# Patient Record
Sex: Female | Born: 1950
Health system: Southern US, Community
[De-identification: ages and names within clinical notes are randomized; demographics above are authoritative.]

## PROBLEM LIST (undated history)

## (undated) DIAGNOSIS — R011 Cardiac murmur, unspecified: Secondary | ICD-10-CM

## (undated) DIAGNOSIS — D649 Anemia, unspecified: Secondary | ICD-10-CM

## (undated) DIAGNOSIS — J349 Unspecified disorder of nose and nasal sinuses: Secondary | ICD-10-CM

## (undated) DIAGNOSIS — K5900 Constipation, unspecified: Secondary | ICD-10-CM

## (undated) DIAGNOSIS — I809 Phlebitis and thrombophlebitis of unspecified site: Secondary | ICD-10-CM

## (undated) DIAGNOSIS — I82409 Acute embolism and thrombosis of unspecified deep veins of unspecified lower extremity: Secondary | ICD-10-CM

## (undated) DIAGNOSIS — R519 Headache, unspecified: Secondary | ICD-10-CM

## (undated) DIAGNOSIS — I639 Cerebral infarction, unspecified: Secondary | ICD-10-CM

## (undated) DIAGNOSIS — R51 Headache: Secondary | ICD-10-CM

## (undated) DIAGNOSIS — I10 Essential (primary) hypertension: Secondary | ICD-10-CM

## (undated) DIAGNOSIS — R002 Palpitations: Secondary | ICD-10-CM

## (undated) DIAGNOSIS — M791 Myalgia, unspecified site: Secondary | ICD-10-CM

## (undated) HISTORY — DX: Headache: R51

## (undated) HISTORY — DX: Myalgia, unspecified site: M79.10

## (undated) HISTORY — DX: Cerebral infarction, unspecified: I63.9

## (undated) HISTORY — DX: Unspecified disorder of nose and nasal sinuses: J34.9

## (undated) HISTORY — DX: Palpitations: R00.2

## (undated) HISTORY — DX: Headache, unspecified: R51.9

## (undated) HISTORY — PX: PARTIAL HYSTERECTOMY: SHX80

## (undated) HISTORY — DX: Phlebitis and thrombophlebitis of unspecified site: I80.9

## (undated) HISTORY — PX: COLONOSCOPY: SHX174

---

## 2002-09-17 ENCOUNTER — Encounter: Payer: Self-pay | Admitting: Neurology

## 2002-09-17 ENCOUNTER — Inpatient Hospital Stay (HOSPITAL_COMMUNITY): Admission: EM | Admit: 2002-09-17 | Discharge: 2002-09-24 | Payer: Self-pay | Admitting: Emergency Medicine

## 2002-09-17 ENCOUNTER — Encounter: Payer: Self-pay | Admitting: Emergency Medicine

## 2002-09-18 ENCOUNTER — Encounter: Payer: Self-pay | Admitting: Neurology

## 2002-09-24 ENCOUNTER — Encounter: Payer: Self-pay | Admitting: Physical Medicine & Rehabilitation

## 2002-09-24 ENCOUNTER — Inpatient Hospital Stay (HOSPITAL_COMMUNITY)
Admission: RE | Admit: 2002-09-24 | Discharge: 2002-10-25 | Payer: Self-pay | Admitting: Physical Medicine & Rehabilitation

## 2002-09-25 ENCOUNTER — Encounter: Payer: Self-pay | Admitting: Physical Medicine & Rehabilitation

## 2003-03-04 ENCOUNTER — Encounter
Admission: RE | Admit: 2003-03-04 | Discharge: 2003-06-02 | Payer: Self-pay | Admitting: Physical Medicine & Rehabilitation

## 2003-07-01 ENCOUNTER — Encounter
Admission: RE | Admit: 2003-07-01 | Discharge: 2003-09-29 | Payer: Self-pay | Admitting: Physical Medicine & Rehabilitation

## 2003-10-02 ENCOUNTER — Encounter
Admission: RE | Admit: 2003-10-02 | Discharge: 2003-12-31 | Payer: Self-pay | Admitting: Physical Medicine & Rehabilitation

## 2004-01-01 ENCOUNTER — Encounter
Admission: RE | Admit: 2004-01-01 | Discharge: 2004-03-31 | Payer: Self-pay | Admitting: Physical Medicine & Rehabilitation

## 2004-04-01 ENCOUNTER — Encounter
Admission: RE | Admit: 2004-04-01 | Discharge: 2004-06-30 | Payer: Self-pay | Admitting: Physical Medicine & Rehabilitation

## 2004-07-06 ENCOUNTER — Encounter
Admission: RE | Admit: 2004-07-06 | Discharge: 2004-09-30 | Payer: Self-pay | Admitting: Physical Medicine & Rehabilitation

## 2004-07-07 ENCOUNTER — Ambulatory Visit: Payer: Self-pay | Admitting: Physical Medicine & Rehabilitation

## 2004-09-30 ENCOUNTER — Encounter
Admission: RE | Admit: 2004-09-30 | Discharge: 2004-12-29 | Payer: Self-pay | Admitting: Physical Medicine & Rehabilitation

## 2004-10-29 ENCOUNTER — Ambulatory Visit: Payer: Self-pay | Admitting: Physical Medicine & Rehabilitation

## 2005-01-25 ENCOUNTER — Encounter
Admission: RE | Admit: 2005-01-25 | Discharge: 2005-04-25 | Payer: Self-pay | Admitting: Physical Medicine & Rehabilitation

## 2005-01-27 ENCOUNTER — Ambulatory Visit: Payer: Self-pay | Admitting: Physical Medicine & Rehabilitation

## 2005-04-26 ENCOUNTER — Encounter
Admission: RE | Admit: 2005-04-26 | Discharge: 2005-07-25 | Payer: Self-pay | Admitting: Physical Medicine & Rehabilitation

## 2005-06-24 ENCOUNTER — Ambulatory Visit: Payer: Self-pay | Admitting: Physical Medicine & Rehabilitation

## 2005-10-15 ENCOUNTER — Encounter
Admission: RE | Admit: 2005-10-15 | Discharge: 2006-01-13 | Payer: Self-pay | Admitting: Physical Medicine & Rehabilitation

## 2005-11-18 ENCOUNTER — Ambulatory Visit: Payer: Self-pay | Admitting: Physical Medicine & Rehabilitation

## 2013-10-03 ENCOUNTER — Ambulatory Visit: Payer: Self-pay

## 2013-10-08 ENCOUNTER — Ambulatory Visit (INDEPENDENT_AMBULATORY_CARE_PROVIDER_SITE_OTHER): Payer: Medicare HMO | Admitting: Podiatrist

## 2013-10-08 ENCOUNTER — Encounter: Payer: Self-pay | Admitting: Podiatrist

## 2013-10-08 VITALS — BP 143/87 | HR 70 | Resp 16

## 2013-10-08 DIAGNOSIS — M79609 Pain in unspecified limb: Secondary | ICD-10-CM

## 2013-10-08 DIAGNOSIS — B351 Tinea unguium: Secondary | ICD-10-CM

## 2013-10-08 NOTE — Progress Notes (Signed)
   Subjective:    Patient ID: Anna Hayes, female    DOB: May 23, 1951, 63 y.o.   MRN: [id number]  HPI My nails are sore and uses a crest pad and has been going on for about 6 months and no burning and no throbbing and some thickness and it is on the great toes on both and the 2nd toe left foot    Review of Systems  Constitutional: Negative.   HENT: Positive for sinus pressure.   Eyes: Positive for itching.  Respiratory: Negative.   Cardiovascular: Negative.   Gastrointestinal: Positive for constipation.  Endocrine: Negative.   Genitourinary: Negative.   Musculoskeletal:       Joint pain   Skin:       Change in nails  Allergic/Immunologic: Negative.   Neurological: Positive for headaches.  Hematological: Bruises/bleeds easily.  Psychiatric/Behavioral: Negative.        Objective:   Physical Exam GENERAL APPEARANCE: Alert, conversant. Appropriately groomed. No acute distress. Left sided weakness from prior stroke-- walks with a cane VASCULAR: Pedal pulses palpable 2/4 dp/pt right, fainely palpable left.   Capillary refill time is immediate to all digits,  Proximal to distal cooling it warm to warm.   NEUROLOGIC: sensation is decreased epicritically and protectively to 5.07 monofilament at 3/5 sites right, 0/5 sites left.  Light touch is decreased bilateral, vibratory sensationdecreased bilateral,  MUSCULOSKELETAL: acceptable muscle strength, tone and stability right; decreased on the left side due to previous stroke DERMATOLOGIC: skin color, texture, and turger are within normal limits.  No preulcerative lesions are seen, no interdigital maceration noted.  Digital nails are elongated, thickened, discolored, dystrophic x10. Left second toenail is curling in and painful     Assessment & Plan:  Symptomatic onychomycosis  Discussed etiology, pathology, conservative vs. Surgical therapies and at this time debridement of symptomatic toenails was recommended.  Onychoreduction of  symptomatic toenails was performed without iatrogenic incident.  Patient was instructed on signs and symptoms of infection and was told to call immediately should any of these arise.

## 2013-12-10 ENCOUNTER — Encounter: Payer: Self-pay | Admitting: Podiatrist

## 2013-12-10 ENCOUNTER — Ambulatory Visit (INDEPENDENT_AMBULATORY_CARE_PROVIDER_SITE_OTHER): Payer: Medicare HMO | Admitting: Podiatrist

## 2013-12-10 VITALS — BP 135/72 | HR 70 | Resp 18

## 2013-12-10 DIAGNOSIS — B351 Tinea unguium: Secondary | ICD-10-CM

## 2013-12-10 DIAGNOSIS — M79609 Pain in unspecified limb: Secondary | ICD-10-CM

## 2013-12-10 NOTE — Patient Instructions (Signed)

## 2013-12-10 NOTE — Progress Notes (Signed)
These corners are sore and tender and are killing me on both big toes and the 2nd toes on both feet and hurts with tennis shoes    HPI:  Patient presents today for follow up of foot and nail care. Denies any new complaints today. She would like a permanent procedure on 1,2 toes bilateral but would like to hold off today as she has a family function in April and wants to be fully healed for the event  Objective:  Patients chart is reviewed.  Vascular status reveals pedal pulses noted at 1 out of 4 dp and pt bilateral .  Neurological sensation is unchanged.  Previous stroke with left sided weakness present.  Patients nails are thickened, discolored, distrophic, friable and brittle with yellow-brown discoloration. Pain on the medial and lateral nail borders are present bilateral first and second toes bilaterally. Patient subjectively relates they are painful with shoes and with ambulation of bilateral feet.  Assessment:  Symptomatic onychomycosis, ingrown toenails 1 and 2 bilateral  Plan:  Discussed treatment options and alternatives.  The symptomatic toenails were debrided through manual an mechanical means without complication. Discussed a permanent phenol matrixectomy to be performed on the right foot than the left foot was healed. She'll call if she would like to have this done. Otherwise Return appointment recommended at routine intervals of 3 months    Trudie Buckler, DPM

## 2014-01-01 ENCOUNTER — Telehealth: Payer: Self-pay | Admitting: *Deleted

## 2014-01-01 NOTE — Telephone Encounter (Signed)
I need to know if I need to let my toenails grow out so I can have the procedure Dr. Valentina Lucks is going to perform on my feet.  I returned her call.  She stated Dr. Valentina Lucks is going to cut the corners out of her nail.  I told her no it's not necessary to let them grow out.

## 2014-01-28 ENCOUNTER — Encounter: Payer: Self-pay | Admitting: Podiatrist

## 2014-01-28 ENCOUNTER — Ambulatory Visit (INDEPENDENT_AMBULATORY_CARE_PROVIDER_SITE_OTHER): Payer: Medicare HMO | Admitting: Podiatrist

## 2014-01-28 VITALS — BP 123/68 | HR 66 | Resp 18

## 2014-01-28 DIAGNOSIS — L600 Ingrowing nail: Secondary | ICD-10-CM

## 2014-01-28 MED ORDER — HYDROCODONE-ACETAMINOPHEN 5-325 MG PO TABS: 1.0000 | ORAL_TABLET | ORAL | Status: DC | PRN

## 2014-01-28 NOTE — Patient Instructions (Signed)

## 2014-01-28 NOTE — Progress Notes (Signed)
I want to get the big toenail and the 2nd toenail done on the left foot and would like to have the right toes done as well  Left 1st and 2nd toenails all borders.. Next week right 1 and 2  HPI: Patient presents today for follow up of foot and nail care. Denies any new complaints today. She would like a permanent procedure on 1,2 toes bilateral and would like to have the left foot done first. We discussed permanent removal of medial and lateral nail borders.   Objective: Patients chart is reviewed. Vascular status reveals pedal pulses noted at 1 out of 4 dp and pt bilateral . Neurological sensation is unchanged. Previous stroke with left sided weakness present.  Pain on the medial and lateral nail borders are present bilateral first and second toes bilaterally left is more symptomatic than the right today. Ingrown deformity on the nail borders is also present bilateral nail borders bilateral feet..    Assessment: ingrown toenails 1 and 2 left  Plan:  Treatment options and alternatives discussed.  Recommended permanent phenol matrixectomy and patient agreed.  Left toes 1,2 was prepped with alcohol and a 1 to 1 mix of 0.5% marcaine plain and 2% lidocaine plain was administered in a digital block fashion.  The toe was then prepped with betadine solution and exsanguinated.  The offending nail border was then excised and matrix tissue exposed.  Phenol was then applied to the matrix tissue followed by an alcohol wash.  Antibiotic ointment and a dry sterile dressing was applied.  The patient was dispensed instructions for aftercare.  I will see her back in one week and if the toes is healing well we will do the borders on the right first and second toenails.

## 2014-02-04 ENCOUNTER — Encounter: Payer: Self-pay | Admitting: Podiatrist

## 2014-02-04 ENCOUNTER — Ambulatory Visit (INDEPENDENT_AMBULATORY_CARE_PROVIDER_SITE_OTHER): Payer: Medicare HMO | Admitting: Podiatrist

## 2014-02-04 VITALS — BP 129/76 | HR 73 | Resp 18

## 2014-02-04 DIAGNOSIS — L600 Ingrowing nail: Secondary | ICD-10-CM

## 2014-02-04 NOTE — Patient Instructions (Signed)

## 2014-02-04 NOTE — Progress Notes (Signed)
My big toenail and my 2nd toenail on my left is doing good but they are sore and my right big toenail and my 2nd toenail needs to be removed  HPI: Patient presents today for follow up of permanent phenol matrixectomy of the left 1st and 2nd toenails both borders.  Denies any new complaints today. She would like a permanent procedure on 1,2 toes of the right foot today.  We discussed permanent removal of medial and lateral nail borders.   Objective: Patients chart is reviewed. Vascular status reveals pedal pulses noted at 1 out of 4 dp and pt bilateral . Neurological sensation is unchanged. Previous stroke with left sided weakness present. Pain on the medial and lateral nail borders are present bilateral first and second toes bilaterally . Ingrown deformity on the nail borders is also present bilateral nail borders right foot.  Left foot ingrowns are healing nicely from their recent procedure  Assessment: ingrown toenails 1 and 2 right;  Healing from a phenol matrixectomy 1,2 left  Plan: Treatment options and alternatives discussed. Recommended permanent phenol matrixectomy and patient agreed.  Right toes 1,2 was prepped with alcohol and a 1 to 1 mix of 0.5% marcaine plain and 2% lidocaine plain was administered in a digital block fashion. The toes were then prepped with betadine solution and exsanguinated. The offending nail border was then excised and matrix tissue exposed. Phenol was then applied to the matrix tissue followed by an alcohol wash. Antibiotic ointment and a dry sterile dressing was applied. The patient was dispensed instructions for aftercare.  She will call if she has any problems.

## 2014-05-13 ENCOUNTER — Ambulatory Visit (INDEPENDENT_AMBULATORY_CARE_PROVIDER_SITE_OTHER): Payer: Medicare HMO | Admitting: Podiatrist

## 2014-05-13 ENCOUNTER — Encounter: Payer: Self-pay | Admitting: Podiatrist

## 2014-05-13 VITALS — BP 116/63 | HR 81 | Resp 18

## 2014-05-13 DIAGNOSIS — M79673 Pain in unspecified foot: Secondary | ICD-10-CM

## 2014-05-13 DIAGNOSIS — M79609 Pain in unspecified limb: Secondary | ICD-10-CM

## 2014-05-13 DIAGNOSIS — B351 Tinea unguium: Secondary | ICD-10-CM

## 2014-05-13 NOTE — Progress Notes (Signed)
  HPI:  Patient presents today for follow up of foot and nail care. Relates discomfort on the medial side of the right great toe where a ap nail procedure was recently performed.    Objective:  Patients chart is reviewed.  Vascular status reveals pedal pulses noted at 1 out of 4 dp and pt bilateral .  Neurological sensation is impaired to [employer] monofilament bilateral.  Patients nails are thickened, discolored, distrophic, friable and brittle with yellow-brown discoloration. Discomfort along the medial corner of the right great toe from a area of crust that has formed in the corner. No redness, swelling, no drainage, no sign of infection is present. Patient subjectively relates they are painful with shoes and with ambulation of bilateral feet.  Assessment:  Symptomatic onychomycosis  Plan:  Discussed treatment options and alternatives.  The symptomatic toenails were debrided through manual an mechanical means without complication.  Return appointment recommended at routine intervals of 3 months

## 2014-08-18 ENCOUNTER — Ambulatory Visit (INDEPENDENT_AMBULATORY_CARE_PROVIDER_SITE_OTHER): Payer: Medicare HMO

## 2014-08-18 VITALS — BP 129/78 | HR 70 | Resp 12

## 2014-08-18 DIAGNOSIS — M79673 Pain in unspecified foot: Secondary | ICD-10-CM

## 2014-08-18 DIAGNOSIS — L600 Ingrowing nail: Secondary | ICD-10-CM

## 2014-08-18 DIAGNOSIS — B351 Tinea unguium: Secondary | ICD-10-CM

## 2014-08-18 NOTE — Progress Notes (Signed)
   Subjective:    Patient ID: Anna Hayes, female    DOB: [birth year], 63 y.o.   MRN: [id number]  HPI  TRIM MY TOENAILS.  Review of Systems no new findings or systemic changes noted    Objective:   Physical Exam Vascular status is intact and unchanged DP and PT one over 4 bilateral capillary fill time 3 seconds +1 edema noted bilateral epicritic sensation decreased forefoot digits and arch in [employer] testing. She does have a history of stroke affecting her left side has loss of motor and sensory function left side both arm and leg. Nails thick brittle crumbly friable dystrophic incurvated 1 through 5 bilateral painful tender both on palpation and with enclosed shoe wearing and ambulation. No secondary infection no purulence or drainage no open wounds or ulcers are identified clinically there is semirigid digital contractures 1 through 5 bilateral     Assessment & Plan:  Assessment this time is onychomycosis painful mycotic nail with history of stroke in neuropathy affecting her left side patient does have some difficulty unable to cut her nails painful mycotic nails with discoloration and friability and dystrophy debrided 1 through 5 bilateral return for future palliative care every 3 months as recommended contact us any changes or exacerbations in the interim.  Harriet Masson DPM

## 2014-08-18 NOTE — Patient Instructions (Signed)

## 2014-08-19 ENCOUNTER — Ambulatory Visit: Payer: Medicare HMO | Admitting: Podiatrist

## 2014-11-21 ENCOUNTER — Ambulatory Visit: Payer: Medicare HMO

## 2014-11-28 DIAGNOSIS — K219 Gastro-esophageal reflux disease without esophagitis: Secondary | ICD-10-CM | POA: Diagnosis not present

## 2014-11-28 DIAGNOSIS — E034 Atrophy of thyroid (acquired): Secondary | ICD-10-CM | POA: Diagnosis not present

## 2014-11-28 DIAGNOSIS — E032 Hypothyroidism due to medicaments and other exogenous substances: Secondary | ICD-10-CM | POA: Diagnosis not present

## 2014-11-28 DIAGNOSIS — E782 Mixed hyperlipidemia: Secondary | ICD-10-CM | POA: Diagnosis not present

## 2014-11-28 DIAGNOSIS — I10 Essential (primary) hypertension: Secondary | ICD-10-CM | POA: Diagnosis not present

## 2015-02-23 DIAGNOSIS — Z780 Asymptomatic menopausal state: Secondary | ICD-10-CM | POA: Diagnosis not present

## 2015-02-23 DIAGNOSIS — Z1231 Encounter for screening mammogram for malignant neoplasm of breast: Secondary | ICD-10-CM | POA: Diagnosis not present

## 2015-02-23 DIAGNOSIS — Z13820 Encounter for screening for osteoporosis: Secondary | ICD-10-CM | POA: Diagnosis not present

## 2015-02-23 DIAGNOSIS — M8589 Other specified disorders of bone density and structure, multiple sites: Secondary | ICD-10-CM | POA: Diagnosis not present

## 2015-03-06 DIAGNOSIS — E034 Atrophy of thyroid (acquired): Secondary | ICD-10-CM | POA: Diagnosis not present

## 2015-03-06 DIAGNOSIS — E782 Mixed hyperlipidemia: Secondary | ICD-10-CM | POA: Diagnosis not present

## 2015-03-06 DIAGNOSIS — K219 Gastro-esophageal reflux disease without esophagitis: Secondary | ICD-10-CM | POA: Diagnosis not present

## 2015-03-06 DIAGNOSIS — E032 Hypothyroidism due to medicaments and other exogenous substances: Secondary | ICD-10-CM | POA: Diagnosis not present

## 2015-03-06 DIAGNOSIS — J209 Acute bronchitis, unspecified: Secondary | ICD-10-CM | POA: Diagnosis not present

## 2015-03-06 DIAGNOSIS — G43009 Migraine without aura, not intractable, without status migrainosus: Secondary | ICD-10-CM | POA: Diagnosis not present

## 2015-04-28 DIAGNOSIS — R21 Rash and other nonspecific skin eruption: Secondary | ICD-10-CM | POA: Diagnosis not present

## 2015-06-03 DIAGNOSIS — M21372 Foot drop, left foot: Secondary | ICD-10-CM | POA: Diagnosis not present

## 2015-06-12 DIAGNOSIS — E032 Hypothyroidism due to medicaments and other exogenous substances: Secondary | ICD-10-CM | POA: Diagnosis not present

## 2015-06-12 DIAGNOSIS — E782 Mixed hyperlipidemia: Secondary | ICD-10-CM | POA: Diagnosis not present

## 2015-06-12 DIAGNOSIS — M25511 Pain in right shoulder: Secondary | ICD-10-CM | POA: Diagnosis not present

## 2015-06-12 DIAGNOSIS — G43009 Migraine without aura, not intractable, without status migrainosus: Secondary | ICD-10-CM | POA: Diagnosis not present

## 2015-06-12 DIAGNOSIS — K219 Gastro-esophageal reflux disease without esophagitis: Secondary | ICD-10-CM | POA: Diagnosis not present

## 2015-06-12 DIAGNOSIS — I10 Essential (primary) hypertension: Secondary | ICD-10-CM | POA: Diagnosis not present

## 2015-06-12 DIAGNOSIS — E034 Atrophy of thyroid (acquired): Secondary | ICD-10-CM | POA: Diagnosis not present

## 2015-07-15 DIAGNOSIS — Z23 Encounter for immunization: Secondary | ICD-10-CM | POA: Diagnosis not present

## 2015-09-14 DIAGNOSIS — E034 Atrophy of thyroid (acquired): Secondary | ICD-10-CM | POA: Diagnosis not present

## 2015-09-14 DIAGNOSIS — I10 Essential (primary) hypertension: Secondary | ICD-10-CM | POA: Diagnosis not present

## 2015-09-14 DIAGNOSIS — E782 Mixed hyperlipidemia: Secondary | ICD-10-CM | POA: Diagnosis not present

## 2015-09-14 DIAGNOSIS — K219 Gastro-esophageal reflux disease without esophagitis: Secondary | ICD-10-CM | POA: Diagnosis not present

## 2015-10-26 DIAGNOSIS — I10 Essential (primary) hypertension: Secondary | ICD-10-CM | POA: Diagnosis not present

## 2015-10-26 DIAGNOSIS — K219 Gastro-esophageal reflux disease without esophagitis: Secondary | ICD-10-CM | POA: Diagnosis not present

## 2015-10-26 DIAGNOSIS — E032 Hypothyroidism due to medicaments and other exogenous substances: Secondary | ICD-10-CM | POA: Diagnosis not present

## 2015-10-26 DIAGNOSIS — E782 Mixed hyperlipidemia: Secondary | ICD-10-CM | POA: Diagnosis not present

## 2015-10-26 DIAGNOSIS — Z23 Encounter for immunization: Secondary | ICD-10-CM | POA: Diagnosis not present

## 2015-11-02 DIAGNOSIS — K219 Gastro-esophageal reflux disease without esophagitis: Secondary | ICD-10-CM | POA: Diagnosis not present

## 2015-11-26 DIAGNOSIS — Z86010 Personal history of colonic polyps: Secondary | ICD-10-CM | POA: Diagnosis not present

## 2015-11-26 DIAGNOSIS — Z8673 Personal history of transient ischemic attack (TIA), and cerebral infarction without residual deficits: Secondary | ICD-10-CM | POA: Diagnosis not present

## 2015-11-26 DIAGNOSIS — F17210 Nicotine dependence, cigarettes, uncomplicated: Secondary | ICD-10-CM | POA: Diagnosis not present

## 2015-11-26 DIAGNOSIS — I10 Essential (primary) hypertension: Secondary | ICD-10-CM | POA: Diagnosis not present

## 2015-11-26 DIAGNOSIS — Z79899 Other long term (current) drug therapy: Secondary | ICD-10-CM | POA: Diagnosis not present

## 2015-11-26 DIAGNOSIS — D126 Benign neoplasm of colon, unspecified: Secondary | ICD-10-CM | POA: Diagnosis not present

## 2015-11-26 DIAGNOSIS — E039 Hypothyroidism, unspecified: Secondary | ICD-10-CM | POA: Diagnosis not present

## 2015-11-26 DIAGNOSIS — Z800 Family history of malignant neoplasm of digestive organs: Secondary | ICD-10-CM | POA: Diagnosis not present

## 2015-11-26 DIAGNOSIS — D569 Thalassemia, unspecified: Secondary | ICD-10-CM | POA: Diagnosis not present

## 2015-11-26 DIAGNOSIS — D122 Benign neoplasm of ascending colon: Secondary | ICD-10-CM | POA: Diagnosis not present

## 2015-11-26 DIAGNOSIS — Z1211 Encounter for screening for malignant neoplasm of colon: Secondary | ICD-10-CM | POA: Diagnosis not present

## 2015-11-26 DIAGNOSIS — K621 Rectal polyp: Secondary | ICD-10-CM | POA: Diagnosis not present

## 2015-11-26 HISTORY — PX: COLONOSCOPY: SHX174

## 2015-11-26 LAB — HM COLONOSCOPY

## 2015-12-14 DIAGNOSIS — K219 Gastro-esophageal reflux disease without esophagitis: Secondary | ICD-10-CM | POA: Diagnosis not present

## 2015-12-14 DIAGNOSIS — I6350 Cerebral infarction due to unspecified occlusion or stenosis of unspecified cerebral artery: Secondary | ICD-10-CM | POA: Diagnosis not present

## 2015-12-14 DIAGNOSIS — E034 Atrophy of thyroid (acquired): Secondary | ICD-10-CM | POA: Diagnosis not present

## 2015-12-14 DIAGNOSIS — I10 Essential (primary) hypertension: Secondary | ICD-10-CM | POA: Diagnosis not present

## 2015-12-14 DIAGNOSIS — G43009 Migraine without aura, not intractable, without status migrainosus: Secondary | ICD-10-CM | POA: Diagnosis not present

## 2015-12-14 DIAGNOSIS — E782 Mixed hyperlipidemia: Secondary | ICD-10-CM | POA: Diagnosis not present

## 2015-12-14 DIAGNOSIS — E032 Hypothyroidism due to medicaments and other exogenous substances: Secondary | ICD-10-CM | POA: Diagnosis not present

## 2016-03-07 DIAGNOSIS — Z1231 Encounter for screening mammogram for malignant neoplasm of breast: Secondary | ICD-10-CM | POA: Diagnosis not present

## 2016-03-16 DIAGNOSIS — I6350 Cerebral infarction due to unspecified occlusion or stenosis of unspecified cerebral artery: Secondary | ICD-10-CM | POA: Diagnosis not present

## 2016-03-16 DIAGNOSIS — K219 Gastro-esophageal reflux disease without esophagitis: Secondary | ICD-10-CM | POA: Diagnosis not present

## 2016-03-16 DIAGNOSIS — E032 Hypothyroidism due to medicaments and other exogenous substances: Secondary | ICD-10-CM | POA: Diagnosis not present

## 2016-03-16 DIAGNOSIS — E782 Mixed hyperlipidemia: Secondary | ICD-10-CM | POA: Diagnosis not present

## 2016-03-16 DIAGNOSIS — I10 Essential (primary) hypertension: Secondary | ICD-10-CM | POA: Diagnosis not present

## 2016-06-22 DIAGNOSIS — I6350 Cerebral infarction due to unspecified occlusion or stenosis of unspecified cerebral artery: Secondary | ICD-10-CM | POA: Diagnosis not present

## 2016-06-22 DIAGNOSIS — G43009 Migraine without aura, not intractable, without status migrainosus: Secondary | ICD-10-CM | POA: Diagnosis not present

## 2016-06-22 DIAGNOSIS — Z23 Encounter for immunization: Secondary | ICD-10-CM | POA: Diagnosis not present

## 2016-06-22 DIAGNOSIS — K219 Gastro-esophageal reflux disease without esophagitis: Secondary | ICD-10-CM | POA: Diagnosis not present

## 2016-06-22 DIAGNOSIS — E782 Mixed hyperlipidemia: Secondary | ICD-10-CM | POA: Diagnosis not present

## 2016-06-22 DIAGNOSIS — I10 Essential (primary) hypertension: Secondary | ICD-10-CM | POA: Diagnosis not present

## 2016-06-22 DIAGNOSIS — E032 Hypothyroidism due to medicaments and other exogenous substances: Secondary | ICD-10-CM | POA: Diagnosis not present

## 2016-09-22 DIAGNOSIS — I10 Essential (primary) hypertension: Secondary | ICD-10-CM | POA: Diagnosis not present

## 2016-09-22 DIAGNOSIS — I69354 Hemiplegia and hemiparesis following cerebral infarction affecting left non-dominant side: Secondary | ICD-10-CM | POA: Diagnosis not present

## 2016-09-22 DIAGNOSIS — E782 Mixed hyperlipidemia: Secondary | ICD-10-CM | POA: Diagnosis not present

## 2016-09-22 DIAGNOSIS — E032 Hypothyroidism due to medicaments and other exogenous substances: Secondary | ICD-10-CM | POA: Diagnosis not present

## 2016-09-22 DIAGNOSIS — K219 Gastro-esophageal reflux disease without esophagitis: Secondary | ICD-10-CM | POA: Diagnosis not present

## 2016-09-22 DIAGNOSIS — I6350 Cerebral infarction due to unspecified occlusion or stenosis of unspecified cerebral artery: Secondary | ICD-10-CM | POA: Diagnosis not present

## 2016-12-21 DIAGNOSIS — K219 Gastro-esophageal reflux disease without esophagitis: Secondary | ICD-10-CM | POA: Diagnosis not present

## 2016-12-21 DIAGNOSIS — G43009 Migraine without aura, not intractable, without status migrainosus: Secondary | ICD-10-CM | POA: Diagnosis not present

## 2016-12-21 DIAGNOSIS — I10 Essential (primary) hypertension: Secondary | ICD-10-CM | POA: Diagnosis not present

## 2016-12-21 DIAGNOSIS — E782 Mixed hyperlipidemia: Secondary | ICD-10-CM | POA: Diagnosis not present

## 2016-12-21 DIAGNOSIS — I6350 Cerebral infarction due to unspecified occlusion or stenosis of unspecified cerebral artery: Secondary | ICD-10-CM | POA: Diagnosis not present

## 2016-12-21 DIAGNOSIS — I69354 Hemiplegia and hemiparesis following cerebral infarction affecting left non-dominant side: Secondary | ICD-10-CM | POA: Diagnosis not present

## 2016-12-21 DIAGNOSIS — E032 Hypothyroidism due to medicaments and other exogenous substances: Secondary | ICD-10-CM | POA: Diagnosis not present

## 2017-03-13 DIAGNOSIS — Z1231 Encounter for screening mammogram for malignant neoplasm of breast: Secondary | ICD-10-CM | POA: Diagnosis not present

## 2017-03-31 DIAGNOSIS — I10 Essential (primary) hypertension: Secondary | ICD-10-CM | POA: Diagnosis not present

## 2017-03-31 DIAGNOSIS — E782 Mixed hyperlipidemia: Secondary | ICD-10-CM | POA: Diagnosis not present

## 2017-03-31 DIAGNOSIS — I69354 Hemiplegia and hemiparesis following cerebral infarction affecting left non-dominant side: Secondary | ICD-10-CM | POA: Diagnosis not present

## 2017-03-31 DIAGNOSIS — E032 Hypothyroidism due to medicaments and other exogenous substances: Secondary | ICD-10-CM | POA: Diagnosis not present

## 2017-03-31 DIAGNOSIS — K219 Gastro-esophageal reflux disease without esophagitis: Secondary | ICD-10-CM | POA: Diagnosis not present

## 2017-05-15 DIAGNOSIS — R21 Rash and other nonspecific skin eruption: Secondary | ICD-10-CM | POA: Diagnosis not present

## 2017-05-27 HISTORY — PX: ABDOMINAL EXPLORATION SURGERY: SHX538

## 2017-06-17 DIAGNOSIS — I779 Disorder of arteries and arterioles, unspecified: Secondary | ICD-10-CM | POA: Diagnosis not present

## 2017-06-17 DIAGNOSIS — B49 Unspecified mycosis: Secondary | ICD-10-CM | POA: Diagnosis not present

## 2017-06-17 DIAGNOSIS — K565 Intestinal adhesions [bands], unspecified as to partial versus complete obstruction: Secondary | ICD-10-CM | POA: Diagnosis not present

## 2017-06-17 DIAGNOSIS — R1110 Vomiting, unspecified: Secondary | ICD-10-CM | POA: Diagnosis not present

## 2017-06-17 DIAGNOSIS — N179 Acute kidney failure, unspecified: Secondary | ICD-10-CM | POA: Diagnosis not present

## 2017-06-17 DIAGNOSIS — K760 Fatty (change of) liver, not elsewhere classified: Secondary | ICD-10-CM | POA: Diagnosis not present

## 2017-06-17 DIAGNOSIS — Z5309 Procedure and treatment not carried out because of other contraindication: Secondary | ICD-10-CM | POA: Diagnosis not present

## 2017-06-17 DIAGNOSIS — I69334 Monoplegia of upper limb following cerebral infarction affecting left non-dominant side: Secondary | ICD-10-CM | POA: Diagnosis not present

## 2017-06-17 DIAGNOSIS — R109 Unspecified abdominal pain: Secondary | ICD-10-CM | POA: Diagnosis not present

## 2017-06-17 DIAGNOSIS — E860 Dehydration: Secondary | ICD-10-CM | POA: Diagnosis not present

## 2017-06-17 DIAGNOSIS — R05 Cough: Secondary | ICD-10-CM | POA: Diagnosis not present

## 2017-06-17 DIAGNOSIS — R110 Nausea: Secondary | ICD-10-CM | POA: Diagnosis not present

## 2017-06-17 DIAGNOSIS — I69354 Hemiplegia and hemiparesis following cerebral infarction affecting left non-dominant side: Secondary | ICD-10-CM | POA: Diagnosis not present

## 2017-06-17 DIAGNOSIS — G43909 Migraine, unspecified, not intractable, without status migrainosus: Secondary | ICD-10-CM | POA: Diagnosis not present

## 2017-06-17 DIAGNOSIS — R5381 Other malaise: Secondary | ICD-10-CM | POA: Diagnosis not present

## 2017-06-17 DIAGNOSIS — R000 Tachycardia, unspecified: Secondary | ICD-10-CM | POA: Diagnosis not present

## 2017-06-17 DIAGNOSIS — I10 Essential (primary) hypertension: Secondary | ICD-10-CM | POA: Diagnosis not present

## 2017-06-17 DIAGNOSIS — D649 Anemia, unspecified: Secondary | ICD-10-CM | POA: Diagnosis not present

## 2017-06-17 DIAGNOSIS — Z8673 Personal history of transient ischemic attack (TIA), and cerebral infarction without residual deficits: Secondary | ICD-10-CM | POA: Diagnosis not present

## 2017-06-17 DIAGNOSIS — Z79899 Other long term (current) drug therapy: Secondary | ICD-10-CM | POA: Diagnosis not present

## 2017-06-17 DIAGNOSIS — M7989 Other specified soft tissue disorders: Secondary | ICD-10-CM | POA: Diagnosis not present

## 2017-06-17 DIAGNOSIS — E785 Hyperlipidemia, unspecified: Secondary | ICD-10-CM | POA: Diagnosis not present

## 2017-06-17 DIAGNOSIS — K219 Gastro-esophageal reflux disease without esophagitis: Secondary | ICD-10-CM | POA: Diagnosis not present

## 2017-06-17 DIAGNOSIS — Z7982 Long term (current) use of aspirin: Secondary | ICD-10-CM | POA: Diagnosis not present

## 2017-06-17 DIAGNOSIS — E039 Hypothyroidism, unspecified: Secondary | ICD-10-CM | POA: Diagnosis not present

## 2017-06-17 DIAGNOSIS — I998 Other disorder of circulatory system: Secondary | ICD-10-CM | POA: Diagnosis not present

## 2017-06-17 DIAGNOSIS — Z720 Tobacco use: Secondary | ICD-10-CM | POA: Diagnosis not present

## 2017-06-17 DIAGNOSIS — K631 Perforation of intestine (nontraumatic): Secondary | ICD-10-CM | POA: Diagnosis not present

## 2017-06-17 DIAGNOSIS — K56609 Unspecified intestinal obstruction, unspecified as to partial versus complete obstruction: Secondary | ICD-10-CM | POA: Diagnosis not present

## 2017-06-17 DIAGNOSIS — I743 Embolism and thrombosis of arteries of the lower extremities: Secondary | ICD-10-CM | POA: Diagnosis not present

## 2017-06-17 DIAGNOSIS — Z90711 Acquired absence of uterus with remaining cervical stump: Secondary | ICD-10-CM | POA: Diagnosis not present

## 2017-06-18 DIAGNOSIS — I10 Essential (primary) hypertension: Secondary | ICD-10-CM | POA: Diagnosis not present

## 2017-06-18 DIAGNOSIS — Z8673 Personal history of transient ischemic attack (TIA), and cerebral infarction without residual deficits: Secondary | ICD-10-CM | POA: Diagnosis not present

## 2017-06-18 DIAGNOSIS — K56609 Unspecified intestinal obstruction, unspecified as to partial versus complete obstruction: Secondary | ICD-10-CM | POA: Diagnosis not present

## 2017-06-18 DIAGNOSIS — N179 Acute kidney failure, unspecified: Secondary | ICD-10-CM | POA: Diagnosis not present

## 2017-06-20 DIAGNOSIS — R109 Unspecified abdominal pain: Secondary | ICD-10-CM | POA: Diagnosis not present

## 2017-06-21 DIAGNOSIS — N179 Acute kidney failure, unspecified: Secondary | ICD-10-CM | POA: Diagnosis not present

## 2017-06-21 DIAGNOSIS — K56609 Unspecified intestinal obstruction, unspecified as to partial versus complete obstruction: Secondary | ICD-10-CM | POA: Diagnosis not present

## 2017-06-21 DIAGNOSIS — I10 Essential (primary) hypertension: Secondary | ICD-10-CM | POA: Diagnosis not present

## 2017-06-21 DIAGNOSIS — Z8673 Personal history of transient ischemic attack (TIA), and cerebral infarction without residual deficits: Secondary | ICD-10-CM | POA: Diagnosis not present

## 2017-06-27 ENCOUNTER — Encounter (HOSPITAL_COMMUNITY)
Admission: EM | Disposition: A | Discharge status: Rehabilitation Facility | Payer: Self-pay | Source: Other Acute Inpatient Hospital | Attending: Internal Medicine

## 2017-06-27 ENCOUNTER — Inpatient Hospital Stay: Admission: EM | Admit: 2017-06-27 | Payer: Medicare Other | Source: Other Acute Inpatient Hospital | Admitting: Surgery

## 2017-06-27 ENCOUNTER — Inpatient Hospital Stay (HOSPITAL_COMMUNITY)
Admission: EM | Admit: 2017-06-27 | Discharge: 2017-07-12 | DRG: 270 | Disposition: A | Discharge status: Rehabilitation Facility | Payer: Medicare Other | Source: Other Acute Inpatient Hospital | Attending: Internal Medicine | Admitting: Internal Medicine

## 2017-06-27 ENCOUNTER — Encounter (HOSPITAL_COMMUNITY): Payer: Self-pay | Admitting: Family Medicine

## 2017-06-27 ENCOUNTER — Encounter (HOSPITAL_COMMUNITY): Payer: Self-pay | Admitting: Certified Registered Nurse Anesthetist

## 2017-06-27 ENCOUNTER — Inpatient Hospital Stay (HOSPITAL_COMMUNITY): Payer: Medicare Other | Admitting: Certified Registered Nurse Anesthetist

## 2017-06-27 DIAGNOSIS — N739 Female pelvic inflammatory disease, unspecified: Secondary | ICD-10-CM | POA: Diagnosis not present

## 2017-06-27 DIAGNOSIS — H44002 Unspecified purulent endophthalmitis, left eye: Secondary | ICD-10-CM | POA: Diagnosis not present

## 2017-06-27 DIAGNOSIS — K56609 Unspecified intestinal obstruction, unspecified as to partial versus complete obstruction: Secondary | ICD-10-CM | POA: Diagnosis not present

## 2017-06-27 DIAGNOSIS — I69392 Facial weakness following cerebral infarction: Secondary | ICD-10-CM

## 2017-06-27 DIAGNOSIS — R509 Fever, unspecified: Secondary | ICD-10-CM | POA: Diagnosis not present

## 2017-06-27 DIAGNOSIS — H44001 Unspecified purulent endophthalmitis, right eye: Secondary | ICD-10-CM | POA: Diagnosis not present

## 2017-06-27 DIAGNOSIS — G8918 Other acute postprocedural pain: Secondary | ICD-10-CM | POA: Diagnosis not present

## 2017-06-27 DIAGNOSIS — I639 Cerebral infarction, unspecified: Secondary | ICD-10-CM | POA: Diagnosis present

## 2017-06-27 DIAGNOSIS — Z720 Tobacco use: Secondary | ICD-10-CM | POA: Diagnosis not present

## 2017-06-27 DIAGNOSIS — R000 Tachycardia, unspecified: Secondary | ICD-10-CM | POA: Diagnosis not present

## 2017-06-27 DIAGNOSIS — Z880 Allergy status to penicillin: Secondary | ICD-10-CM

## 2017-06-27 DIAGNOSIS — I6930 Unspecified sequelae of cerebral infarction: Secondary | ICD-10-CM

## 2017-06-27 DIAGNOSIS — N179 Acute kidney failure, unspecified: Secondary | ICD-10-CM | POA: Diagnosis not present

## 2017-06-27 DIAGNOSIS — M792 Neuralgia and neuritis, unspecified: Secondary | ICD-10-CM | POA: Diagnosis not present

## 2017-06-27 DIAGNOSIS — K5901 Slow transit constipation: Secondary | ICD-10-CM | POA: Diagnosis not present

## 2017-06-27 DIAGNOSIS — B49 Unspecified mycosis: Secondary | ICD-10-CM | POA: Diagnosis not present

## 2017-06-27 DIAGNOSIS — D72829 Elevated white blood cell count, unspecified: Secondary | ICD-10-CM

## 2017-06-27 DIAGNOSIS — I743 Embolism and thrombosis of arteries of the lower extremities: Secondary | ICD-10-CM | POA: Diagnosis not present

## 2017-06-27 DIAGNOSIS — G5792 Unspecified mononeuropathy of left lower limb: Secondary | ICD-10-CM

## 2017-06-27 DIAGNOSIS — E876 Hypokalemia: Secondary | ICD-10-CM | POA: Diagnosis not present

## 2017-06-27 DIAGNOSIS — E669 Obesity, unspecified: Secondary | ICD-10-CM | POA: Diagnosis present

## 2017-06-27 DIAGNOSIS — S78112A Complete traumatic amputation at level between left hip and knee, initial encounter: Secondary | ICD-10-CM

## 2017-06-27 DIAGNOSIS — R109 Unspecified abdominal pain: Secondary | ICD-10-CM | POA: Diagnosis not present

## 2017-06-27 DIAGNOSIS — B377 Candidal sepsis: Secondary | ICD-10-CM | POA: Diagnosis present

## 2017-06-27 DIAGNOSIS — R110 Nausea: Secondary | ICD-10-CM

## 2017-06-27 DIAGNOSIS — Z7189 Other specified counseling: Secondary | ICD-10-CM | POA: Diagnosis not present

## 2017-06-27 DIAGNOSIS — I9751 Accidental puncture and laceration of a circulatory system organ or structure during a circulatory system procedure: Secondary | ICD-10-CM | POA: Diagnosis not present

## 2017-06-27 DIAGNOSIS — K5900 Constipation, unspecified: Secondary | ICD-10-CM

## 2017-06-27 DIAGNOSIS — I70229 Atherosclerosis of native arteries of extremities with rest pain, unspecified extremity: Secondary | ICD-10-CM | POA: Diagnosis present

## 2017-06-27 DIAGNOSIS — F17210 Nicotine dependence, cigarettes, uncomplicated: Secondary | ICD-10-CM | POA: Diagnosis present

## 2017-06-27 DIAGNOSIS — I96 Gangrene, not elsewhere classified: Secondary | ICD-10-CM | POA: Diagnosis not present

## 2017-06-27 DIAGNOSIS — Z79899 Other long term (current) drug therapy: Secondary | ICD-10-CM | POA: Diagnosis not present

## 2017-06-27 DIAGNOSIS — L89312 Pressure ulcer of right buttock, stage 2: Secondary | ICD-10-CM | POA: Diagnosis present

## 2017-06-27 DIAGNOSIS — Z4781 Encounter for orthopedic aftercare following surgical amputation: Secondary | ICD-10-CM | POA: Diagnosis not present

## 2017-06-27 DIAGNOSIS — E8809 Other disorders of plasma-protein metabolism, not elsewhere classified: Secondary | ICD-10-CM | POA: Diagnosis present

## 2017-06-27 DIAGNOSIS — D509 Iron deficiency anemia, unspecified: Secondary | ICD-10-CM | POA: Diagnosis present

## 2017-06-27 DIAGNOSIS — Z8719 Personal history of other diseases of the digestive system: Secondary | ICD-10-CM | POA: Diagnosis not present

## 2017-06-27 DIAGNOSIS — I82433 Acute embolism and thrombosis of popliteal vein, bilateral: Secondary | ICD-10-CM | POA: Diagnosis present

## 2017-06-27 DIAGNOSIS — I10 Essential (primary) hypertension: Secondary | ICD-10-CM

## 2017-06-27 DIAGNOSIS — I739 Peripheral vascular disease, unspecified: Secondary | ICD-10-CM | POA: Diagnosis not present

## 2017-06-27 DIAGNOSIS — R188 Other ascites: Secondary | ICD-10-CM | POA: Diagnosis present

## 2017-06-27 DIAGNOSIS — I998 Other disorder of circulatory system: Secondary | ICD-10-CM | POA: Diagnosis present

## 2017-06-27 DIAGNOSIS — Z91040 Latex allergy status: Secondary | ICD-10-CM

## 2017-06-27 DIAGNOSIS — S78119A Complete traumatic amputation at level between unspecified hip and knee, initial encounter: Secondary | ICD-10-CM

## 2017-06-27 DIAGNOSIS — Z23 Encounter for immunization: Secondary | ICD-10-CM | POA: Diagnosis not present

## 2017-06-27 DIAGNOSIS — R1013 Epigastric pain: Secondary | ICD-10-CM | POA: Diagnosis not present

## 2017-06-27 DIAGNOSIS — H4419 Other endophthalmitis: Secondary | ICD-10-CM | POA: Diagnosis not present

## 2017-06-27 DIAGNOSIS — K651 Peritoneal abscess: Secondary | ICD-10-CM | POA: Diagnosis not present

## 2017-06-27 DIAGNOSIS — Z515 Encounter for palliative care: Secondary | ICD-10-CM

## 2017-06-27 DIAGNOSIS — Y838 Other surgical procedures as the cause of abnormal reaction of the patient, or of later complication, without mention of misadventure at the time of the procedure: Secondary | ICD-10-CM | POA: Diagnosis not present

## 2017-06-27 DIAGNOSIS — M7989 Other specified soft tissue disorders: Secondary | ICD-10-CM | POA: Diagnosis not present

## 2017-06-27 DIAGNOSIS — H3341 Traction detachment of retina, right eye: Secondary | ICD-10-CM | POA: Diagnosis not present

## 2017-06-27 DIAGNOSIS — D62 Acute posthemorrhagic anemia: Secondary | ICD-10-CM

## 2017-06-27 DIAGNOSIS — B3789 Other sites of candidiasis: Secondary | ICD-10-CM | POA: Diagnosis not present

## 2017-06-27 DIAGNOSIS — I69334 Monoplegia of upper limb following cerebral infarction affecting left non-dominant side: Secondary | ICD-10-CM

## 2017-06-27 DIAGNOSIS — Y92234 Operating room of hospital as the place of occurrence of the external cause: Secondary | ICD-10-CM | POA: Diagnosis not present

## 2017-06-27 DIAGNOSIS — L8990 Pressure ulcer of unspecified site, unspecified stage: Secondary | ICD-10-CM | POA: Insufficient documentation

## 2017-06-27 DIAGNOSIS — T8149XA Infection following a procedure, other surgical site, initial encounter: Secondary | ICD-10-CM | POA: Diagnosis not present

## 2017-06-27 DIAGNOSIS — M79661 Pain in right lower leg: Secondary | ICD-10-CM | POA: Diagnosis not present

## 2017-06-27 DIAGNOSIS — I69354 Hemiplegia and hemiparesis following cerebral infarction affecting left non-dominant side: Secondary | ICD-10-CM | POA: Diagnosis not present

## 2017-06-27 DIAGNOSIS — R7881 Bacteremia: Secondary | ICD-10-CM | POA: Diagnosis not present

## 2017-06-27 DIAGNOSIS — Z89612 Acquired absence of left leg above knee: Secondary | ICD-10-CM | POA: Diagnosis not present

## 2017-06-27 DIAGNOSIS — Z8673 Personal history of transient ischemic attack (TIA), and cerebral infarction without residual deficits: Secondary | ICD-10-CM | POA: Diagnosis not present

## 2017-06-27 HISTORY — PX: FASCIOTOMY: SHX132

## 2017-06-27 HISTORY — PX: EMBOLECTOMY: SHX44

## 2017-06-27 HISTORY — PX: PATCH ANGIOPLASTY: SHX6230

## 2017-06-27 HISTORY — DX: Essential (primary) hypertension: I10

## 2017-06-27 LAB — APTT: aPTT: 155 seconds — ABNORMAL HIGH (ref 24–36)

## 2017-06-27 LAB — COMPREHENSIVE METABOLIC PANEL
ALT: 22 U/L (ref 14–54)
AST: 44 U/L — AB (ref 15–41)
Albumin: 1.4 g/dL — ABNORMAL LOW (ref 3.5–5.0)
Alkaline Phosphatase: 47 U/L (ref 38–126)
Anion gap: 3 — ABNORMAL LOW (ref 5–15)
BILIRUBIN TOTAL: 0.4 mg/dL (ref 0.3–1.2)
CO2: 19 mmol/L — ABNORMAL LOW (ref 22–32)
CREATININE: 0.71 mg/dL (ref 0.44–1.00)
Calcium: 6.8 mg/dL — ABNORMAL LOW (ref 8.9–10.3)
Chloride: 111 mmol/L (ref 101–111)
GFR calc Af Amer: >60 mL/min (ref >60)
Glucose, Bld: 92 mg/dL (ref 65–99)
POTASSIUM: 4.3 mmol/L (ref 3.5–5.1)
Sodium: 133 mmol/L — ABNORMAL LOW (ref 135–145)
TOTAL PROTEIN: 4.3 g/dL — AB (ref 6.5–8.1)

## 2017-06-27 LAB — CBC
HEMATOCRIT: 30.9 % — AB (ref 36.0–46.0)
HEMOGLOBIN: 10.4 g/dL — AB (ref 12.0–15.0)
MCH: 25.1 pg — ABNORMAL LOW (ref 26.0–34.0)
MCHC: 33.7 g/dL (ref 30.0–36.0)
MCV: 74.6 fL — AB (ref 78.0–100.0)
Platelets: 196 10*3/uL (ref 150–400)
RBC: 4.14 MIL/uL (ref 3.87–5.11)
RDW: 16.1 % — ABNORMAL HIGH (ref 11.5–15.5)
WBC: 27.6 10*3/uL — ABNORMAL HIGH (ref 4.0–10.5)

## 2017-06-27 LAB — POCT I-STAT 7, (LYTES, BLD GAS, ICA,H+H)
ACID-BASE DEFICIT: 5 mmol/L — AB (ref 0.0–2.0)
Bicarbonate: 20.3 mmol/L (ref 20.0–28.0)
Calcium, Ion: 1.05 mmol/L — ABNORMAL LOW (ref 1.15–1.40)
HCT: 23 % — ABNORMAL LOW (ref 36.0–46.0)
HEMOGLOBIN: 7.8 g/dL — AB (ref 12.0–15.0)
O2 Saturation: 99 %
PCO2 ART: 37.2 mmHg (ref 32.0–48.0)
PH ART: 7.343 — AB (ref 7.350–7.450)
PO2 ART: 170 mmHg — AB (ref 83.0–108.0)
Potassium: 4.4 mmol/L (ref 3.5–5.1)
Sodium: 138 mmol/L (ref 135–145)
TCO2: 21 mmol/L — ABNORMAL LOW (ref 22–32)

## 2017-06-27 LAB — ABO/RH: ABO/RH(D): A POS

## 2017-06-27 LAB — PREPARE RBC (CROSSMATCH)

## 2017-06-27 SURGERY — EMBOLECTOMY
Anesthesia: General | Site: Leg Upper | Laterality: Left

## 2017-06-27 MED ORDER — DEXAMETHASONE SODIUM PHOSPHATE 10 MG/ML IJ SOLN
INTRAMUSCULAR | Status: DC | PRN
  Administered 2017-06-27: 10 mg via INTRAVENOUS

## 2017-06-27 MED ORDER — SODIUM CHLORIDE 0.9 % IV SOLN: 500.0000 mL | Freq: Once | INTRAVENOUS | Status: DC | PRN

## 2017-06-27 MED ORDER — PANTOPRAZOLE SODIUM 40 MG PO TBEC
40.0000 mg | DELAYED_RELEASE_TABLET | Freq: Every day | ORAL | Status: DC
  Administered 2017-06-28 – 2017-07-12 (×14): 40 mg via ORAL
  Filled 2017-06-27 (×15): qty 1

## 2017-06-27 MED ORDER — ALUM & MAG HYDROXIDE-SIMETH 200-200-20 MG/5ML PO SUSP: 15.0000 mL | ORAL | Status: DC | PRN

## 2017-06-27 MED ORDER — SODIUM CHLORIDE 0.9 % IV SOLN: 100.0000 mg | Freq: Every day | INTRAVENOUS | Status: DC

## 2017-06-27 MED ORDER — LISINOPRIL 10 MG PO TABS
10.0000 mg | ORAL_TABLET | Freq: Every day | ORAL | Status: DC
  Administered 2017-06-28 – 2017-07-06 (×8): 10 mg via ORAL
  Filled 2017-06-27 (×9): qty 1

## 2017-06-27 MED ORDER — SIMVASTATIN 40 MG PO TABS: 40.0000 mg | ORAL_TABLET | Freq: Every day | ORAL | Status: DC

## 2017-06-27 MED ORDER — PHENYLEPHRINE HCL 10 MG/ML IJ SOLN
INTRAMUSCULAR | Status: DC | PRN
  Administered 2017-06-27: 50 ug/min via INTRAVENOUS

## 2017-06-27 MED ORDER — SUCCINYLCHOLINE CHLORIDE 20 MG/ML IJ SOLN
INTRAMUSCULAR | Status: DC | PRN
  Administered 2017-06-27: 160 mg via INTRAVENOUS

## 2017-06-27 MED ORDER — ACETAMINOPHEN 325 MG RE SUPP
325.0000 mg | RECTAL | Status: DC | PRN
  Filled 2017-06-27: qty 2

## 2017-06-27 MED ORDER — FENTANYL CITRATE (PF) 100 MCG/2ML IJ SOLN
INTRAMUSCULAR | Status: DC | PRN
  Administered 2017-06-27 (×4): 50 ug via INTRAVENOUS
  Administered 2017-06-27: 100 ug via INTRAVENOUS
  Administered 2017-06-27: 50 ug via INTRAVENOUS

## 2017-06-27 MED ORDER — LACTATED RINGERS IV SOLN
INTRAVENOUS | Status: DC | PRN
  Administered 2017-06-27: 13:00:00 via INTRAVENOUS

## 2017-06-27 MED ORDER — ONDANSETRON HCL 4 MG/2ML IJ SOLN
INTRAMUSCULAR | Status: AC
  Filled 2017-06-27: qty 2

## 2017-06-27 MED ORDER — SUGAMMADEX SODIUM 200 MG/2ML IV SOLN
INTRAVENOUS | Status: DC | PRN
  Administered 2017-06-27: 160 mg via INTRAVENOUS

## 2017-06-27 MED ORDER — ROCURONIUM BROMIDE 100 MG/10ML IV SOLN
INTRAVENOUS | Status: DC | PRN
  Administered 2017-06-27: 15 mg via INTRAVENOUS
  Administered 2017-06-27: 50 mg via INTRAVENOUS
  Administered 2017-06-27: 20 mg via INTRAVENOUS
  Administered 2017-06-27: 15 mg via INTRAVENOUS
  Administered 2017-06-27: 5 mg via INTRAVENOUS

## 2017-06-27 MED ORDER — LABETALOL HCL 5 MG/ML IV SOLN: 10.0000 mg | INTRAVENOUS | Status: DC | PRN

## 2017-06-27 MED ORDER — HEMOSTATIC AGENTS (NO CHARGE) OPTIME
TOPICAL | Status: DC | PRN
  Administered 2017-06-27: 1 via TOPICAL

## 2017-06-27 MED ORDER — AMLODIPINE BESYLATE 5 MG PO TABS
5.0000 mg | ORAL_TABLET | Freq: Every day | ORAL | Status: DC
  Administered 2017-06-28 – 2017-07-12 (×13): 5 mg via ORAL
  Filled 2017-06-27 (×15): qty 1

## 2017-06-27 MED ORDER — ONDANSETRON HCL 4 MG/2ML IJ SOLN
4.0000 mg | Freq: Four times a day (QID) | INTRAMUSCULAR | Status: DC | PRN
Start: 2017-06-27 — End: 2017-07-12
  Administered 2017-06-28: 4 mg via INTRAVENOUS
  Filled 2017-06-27: qty 2

## 2017-06-27 MED ORDER — DIPHENHYDRAMINE HCL 50 MG/ML IJ SOLN
INTRAMUSCULAR | Status: DC | PRN
  Administered 2017-06-27: 25 mg via INTRAVENOUS

## 2017-06-27 MED ORDER — SODIUM CHLORIDE 0.9 % IV SOLN
INTRAVENOUS | Status: DC
  Administered 2017-06-27: 22:00:00 via INTRAVENOUS

## 2017-06-27 MED ORDER — GUAIFENESIN-DM 100-10 MG/5ML PO SYRP
15.0000 mL | ORAL_SOLUTION | ORAL | Status: DC | PRN
  Filled 2017-06-27: qty 15

## 2017-06-27 MED ORDER — FENTANYL CITRATE (PF) 250 MCG/5ML IJ SOLN
INTRAMUSCULAR | Status: AC
  Filled 2017-06-27: qty 5

## 2017-06-27 MED ORDER — METOCLOPRAMIDE HCL 5 MG PO TABS: 5.0000 mg | ORAL_TABLET | Freq: Four times a day (QID) | ORAL | Status: DC | PRN

## 2017-06-27 MED ORDER — HEPARIN (PORCINE) IN NACL 100-0.45 UNIT/ML-% IJ SOLN
INTRAMUSCULAR | Status: AC
  Administered 2017-06-27: 800 [IU]/h via INTRAVENOUS
  Filled 2017-06-27: qty 250

## 2017-06-27 MED ORDER — HEPARIN (PORCINE) IN NACL 100-0.45 UNIT/ML-% IJ SOLN
1300.0000 [IU]/h | INTRAMUSCULAR | Status: DC
  Administered 2017-06-27: 800 [IU]/h via INTRAVENOUS
  Administered 2017-06-28: 950 [IU]/h via INTRAVENOUS
  Administered 2017-06-29: 1050 [IU]/h via INTRAVENOUS
  Administered 2017-07-01: 1000 [IU]/h via INTRAVENOUS
  Administered 2017-07-02: 1400 [IU]/h via INTRAVENOUS
  Filled 2017-06-27 (×5): qty 250

## 2017-06-27 MED ORDER — ACETAMINOPHEN 325 MG PO TABS
325.0000 mg | ORAL_TABLET | ORAL | Status: DC | PRN
Start: 2017-06-27 — End: 2017-07-12
  Administered 2017-06-28 – 2017-07-11 (×6): 650 mg via ORAL
  Filled 2017-06-27 (×3): qty 2
  Filled 2017-06-27: qty 1
  Filled 2017-06-27 (×3): qty 2

## 2017-06-27 MED ORDER — VANCOMYCIN HCL IN DEXTROSE 1-5 GM/200ML-% IV SOLN
1000.0000 mg | Freq: Two times a day (BID) | INTRAVENOUS | Status: AC
  Administered 2017-06-27 – 2017-06-28 (×2): 1000 mg via INTRAVENOUS
  Filled 2017-06-27 (×2): qty 200

## 2017-06-27 MED ORDER — ONDANSETRON HCL 4 MG/2ML IJ SOLN: 4.0000 mg | Freq: Once | INTRAMUSCULAR | Status: DC | PRN

## 2017-06-27 MED ORDER — SODIUM CHLORIDE 0.9 % IV SOLN: Freq: Once | INTRAVENOUS | Status: DC

## 2017-06-27 MED ORDER — GABAPENTIN 300 MG PO CAPS
300.0000 mg | ORAL_CAPSULE | Freq: Two times a day (BID) | ORAL | Status: DC
  Administered 2017-06-27 – 2017-07-12 (×29): 300 mg via ORAL
  Filled 2017-06-27 (×30): qty 1

## 2017-06-27 MED ORDER — FENTANYL CITRATE (PF) 100 MCG/2ML IJ SOLN: 25.0000 ug | INTRAMUSCULAR | Status: DC | PRN

## 2017-06-27 MED ORDER — HEPARIN SODIUM (PORCINE) 1000 UNIT/ML IJ SOLN
INTRAMUSCULAR | Status: DC | PRN
  Administered 2017-06-27: 2000 [IU] via INTRAVENOUS
  Administered 2017-06-27: 8000 [IU] via INTRAVENOUS
  Administered 2017-06-27: 1000 [IU] via INTRAVENOUS

## 2017-06-27 MED ORDER — PANTOPRAZOLE SODIUM 40 MG PO TBEC: 40.0000 mg | DELAYED_RELEASE_TABLET | Freq: Every day | ORAL | Status: DC

## 2017-06-27 MED ORDER — LEVOFLOXACIN IN D5W 500 MG/100ML IV SOLN
500.0000 mg | Freq: Every day | INTRAVENOUS | Status: DC
  Filled 2017-06-27: qty 100

## 2017-06-27 MED ORDER — HEPARIN SODIUM (PORCINE) 1000 UNIT/ML IJ SOLN
INTRAMUSCULAR | Status: AC
  Filled 2017-06-27: qty 3

## 2017-06-27 MED ORDER — MIDAZOLAM HCL 2 MG/2ML IJ SOLN
INTRAMUSCULAR | Status: AC
  Filled 2017-06-27: qty 2

## 2017-06-27 MED ORDER — DOCUSATE SODIUM 100 MG PO CAPS
100.0000 mg | ORAL_CAPSULE | Freq: Every day | ORAL | Status: DC
  Administered 2017-06-28 – 2017-07-10 (×10): 100 mg via ORAL
  Filled 2017-06-27 (×13): qty 1

## 2017-06-27 MED ORDER — MAGNESIUM SULFATE 2 GM/50ML IV SOLN
2.0000 g | Freq: Every day | INTRAVENOUS | Status: DC | PRN
  Filled 2017-06-27: qty 50

## 2017-06-27 MED ORDER — 0.9 % SODIUM CHLORIDE (POUR BTL) OPTIME
TOPICAL | Status: DC | PRN
  Administered 2017-06-27: 2000 mL

## 2017-06-27 MED ORDER — PHENYLEPHRINE HCL 10 MG/ML IJ SOLN
INTRAMUSCULAR | Status: DC | PRN
  Administered 2017-06-27: 80 ug via INTRAVENOUS

## 2017-06-27 MED ORDER — SODIUM CHLORIDE 0.9 % IV SOLN
INTRAVENOUS | Status: DC | PRN
  Administered 2017-06-27 (×2): via INTRAVENOUS

## 2017-06-27 MED ORDER — LIDOCAINE HCL (CARDIAC) 20 MG/ML IV SOLN
INTRAVENOUS | Status: DC | PRN
  Administered 2017-06-27: 100 mg via INTRAVENOUS

## 2017-06-27 MED ORDER — METOPROLOL TARTRATE 5 MG/5ML IV SOLN: 2.0000 mg | INTRAVENOUS | Status: DC | PRN

## 2017-06-27 MED ORDER — DEXAMETHASONE SODIUM PHOSPHATE 10 MG/ML IJ SOLN
INTRAMUSCULAR | Status: AC
  Filled 2017-06-27: qty 1

## 2017-06-27 MED ORDER — SIMVASTATIN 20 MG PO TABS
20.0000 mg | ORAL_TABLET | Freq: Every day | ORAL | Status: DC
  Administered 2017-06-28 – 2017-07-07 (×10): 20 mg via ORAL
  Filled 2017-06-27 (×10): qty 1

## 2017-06-27 MED ORDER — ONDANSETRON HCL 4 MG/2ML IJ SOLN
INTRAMUSCULAR | Status: DC | PRN
  Administered 2017-06-27: 4 mg via INTRAVENOUS

## 2017-06-27 MED ORDER — METRONIDAZOLE IN NACL 5-0.79 MG/ML-% IV SOLN
500.0000 mg | Freq: Three times a day (TID) | INTRAVENOUS | Status: DC
  Administered 2017-06-28 – 2017-06-29 (×6): 500 mg via INTRAVENOUS
  Filled 2017-06-27 (×6): qty 100

## 2017-06-27 MED ORDER — MORPHINE SULFATE (PF) 2 MG/ML IV SOLN
2.0000 mg | INTRAVENOUS | Status: DC | PRN
  Administered 2017-06-27 – 2017-06-30 (×14): 2 mg via INTRAVENOUS
  Administered 2017-06-30 – 2017-07-05 (×14): 4 mg via INTRAVENOUS
  Administered 2017-07-07 (×2): 2 mg via INTRAVENOUS
  Administered 2017-07-08 – 2017-07-10 (×7): 4 mg via INTRAVENOUS
  Filled 2017-06-27: qty 2
  Filled 2017-06-27 (×4): qty 1
  Filled 2017-06-27: qty 2
  Filled 2017-06-27: qty 1
  Filled 2017-06-27 (×6): qty 2
  Filled 2017-06-27 (×5): qty 1
  Filled 2017-06-27 (×9): qty 2
  Filled 2017-06-27 (×2): qty 1
  Filled 2017-06-27 (×2): qty 2
  Filled 2017-06-27: qty 1
  Filled 2017-06-27: qty 2
  Filled 2017-06-27 (×3): qty 1
  Filled 2017-06-27 (×2): qty 2

## 2017-06-27 MED ORDER — PHENOL 1.4 % MT LIQD: 1.0000 | OROMUCOSAL | Status: DC | PRN

## 2017-06-27 MED ORDER — SODIUM CHLORIDE 0.9 % IV SOLN
INTRAVENOUS | Status: DC | PRN
  Administered 2017-06-27: 12:00:00

## 2017-06-27 MED ORDER — MEPERIDINE HCL 25 MG/ML IJ SOLN: 6.2500 mg | INTRAMUSCULAR | Status: DC | PRN

## 2017-06-27 MED ORDER — DEXTROSE 5 % IV SOLN
1.5000 g | INTRAVENOUS | Status: AC
  Administered 2017-06-27: 1.5 g via INTRAVENOUS
  Filled 2017-06-27: qty 1.5

## 2017-06-27 MED ORDER — POTASSIUM CHLORIDE CRYS ER 20 MEQ PO TBCR: 20.0000 meq | EXTENDED_RELEASE_TABLET | Freq: Every day | ORAL | Status: DC | PRN

## 2017-06-27 MED ORDER — HYDROCORTISONE NA SUCCINATE PF 100 MG IJ SOLR
INTRAMUSCULAR | Status: DC | PRN
  Administered 2017-06-27: 125 mg via INTRAVENOUS

## 2017-06-27 MED ORDER — HYDRALAZINE HCL 20 MG/ML IJ SOLN: 5.0000 mg | INTRAMUSCULAR | Status: DC | PRN

## 2017-06-27 SURGICAL SUPPLY — 54 items
ADH SKN CLS APL DERMABOND .7 (GAUZE/BANDAGES/DRESSINGS) ×8
AGENT HMST SPONGE THK3/8 (HEMOSTASIS) ×2
CANISTER SUCT 3000ML PPV (MISCELLANEOUS) ×4 IMPLANT
CATH EMB 2FR 60CM (CATHETERS) ×2 IMPLANT
CATH EMB 3FR 80CM (CATHETERS) ×4 IMPLANT
CATH EMB 4FR 80CM (CATHETERS) ×3 IMPLANT
CATH EMB 5FR 80CM (CATHETERS) IMPLANT
CLIP VESOCCLUDE MED 24/CT (CLIP) ×6 IMPLANT
CLIP VESOCCLUDE SM WIDE 24/CT (CLIP) ×6 IMPLANT
CONNECTOR Y ATS VAC SYSTEM (MISCELLANEOUS) ×4 IMPLANT
CONT SPECI 4OZ STER CLIK (MISCELLANEOUS) ×6 IMPLANT
DERMABOND ADVANCED (GAUZE/BANDAGES/DRESSINGS) ×8
DERMABOND ADVANCED .7 DNX12 (GAUZE/BANDAGES/DRESSINGS) ×8 IMPLANT
DRAIN CHANNEL 15F RND FF W/TCR (WOUND CARE) IMPLANT
DRAPE X-RAY CASS 24X20 (DRAPES) IMPLANT
DRSG VAC ATS LRG SENSATRAC (GAUZE/BANDAGES/DRESSINGS) ×3 IMPLANT
ELECT REM PT RETURN 9FT ADLT (ELECTROSURGICAL) ×4
ELECTRODE REM PT RTRN 9FT ADLT (ELECTROSURGICAL) ×2 IMPLANT
EVACUATOR SILICONE 100CC (DRAIN) IMPLANT
GLOVE BIOGEL PI IND STRL 7.5 (GLOVE) ×2 IMPLANT
GLOVE BIOGEL PI INDICATOR 7.5 (GLOVE) ×2
GLOVE SURG SS PI 7.5 STRL IVOR (GLOVE) ×4 IMPLANT
GOWN STRL REUS W/ TWL LRG LVL3 (GOWN DISPOSABLE) ×4 IMPLANT
GOWN STRL REUS W/ TWL XL LVL3 (GOWN DISPOSABLE) ×2 IMPLANT
GOWN STRL REUS W/TWL LRG LVL3 (GOWN DISPOSABLE) ×8
GOWN STRL REUS W/TWL XL LVL3 (GOWN DISPOSABLE) ×4
HEMOSTAT SNOW SURGICEL 2X4 (HEMOSTASIS) IMPLANT
HEMOSTAT SPONGE AVITENE ULTRA (HEMOSTASIS) ×2 IMPLANT
KIT BASIN OR (CUSTOM PROCEDURE TRAY) ×4 IMPLANT
KIT ROOM TURNOVER OR (KITS) ×4 IMPLANT
NS IRRIG 1000ML POUR BTL (IV SOLUTION) ×8 IMPLANT
PACK PERIPHERAL VASCULAR (CUSTOM PROCEDURE TRAY) ×4 IMPLANT
PAD ARMBOARD 7.5X6 YLW CONV (MISCELLANEOUS) ×8 IMPLANT
PENCIL BUTTON HOLSTER BLD 10FT (ELECTRODE) ×2 IMPLANT
SET COLLECT BLD 21X3/4 12 (NEEDLE) IMPLANT
STOPCOCK 4 WAY LG BORE MALE ST (IV SETS) ×4 IMPLANT
SUT ETHILON 3 0 PS 1 (SUTURE) IMPLANT
SUT MNCRL AB 4-0 PS2 18 (SUTURE) ×6 IMPLANT
SUT PROLENE 5 0 C 1 24 (SUTURE) ×14 IMPLANT
SUT PROLENE 6 0 BV (SUTURE) ×20 IMPLANT
SUT SILK 3 0 (SUTURE)
SUT SILK 3-0 18XBRD TIE 12 (SUTURE) IMPLANT
SUT VIC AB 2-0 CT1 27 (SUTURE) ×16
SUT VIC AB 2-0 CT1 TAPERPNT 27 (SUTURE) ×2 IMPLANT
SUT VIC AB 3-0 SH 27 (SUTURE) ×16
SUT VIC AB 3-0 SH 27X BRD (SUTURE) ×2 IMPLANT
SYR 3ML LL SCALE MARK (SYRINGE) ×2 IMPLANT
SYR TB 1ML LUER SLIP (SYRINGE) ×3 IMPLANT
TRAY FOLEY W/METER SILVER 16FR (SET/KITS/TRAYS/PACK) ×4 IMPLANT
TUBE SUCT ARGYLE STRL (TUBING) ×2 IMPLANT
TUBING EXTENTION W/L.L. (IV SETS) IMPLANT
UNDERPAD 30X30 (UNDERPADS AND DIAPERS) ×4 IMPLANT
WATER STERILE IRR 1000ML POUR (IV SOLUTION) ×4 IMPLANT
WND VAC CANISTER 500ML (MISCELLANEOUS) ×3 IMPLANT

## 2017-06-27 NOTE — Consult Note (Signed)
Consult Note  Patient name: Anna Hayes MRN: [id number] DOB: May 04, 1951 Sex: female  Consulting Physician:  Hospital service  Reason for Consult: Ischemic leg   HISTORY OF PRESENT ILLNESS: I received a phone call from the hospitalist at [facility] this morning.  The patient has recently undergone abdominal exploration and bowel resection.  She has an open abdomen.  She had a percutaneous drain placed yesterday.  A CT scan was performed last night which showed popliteal occlusion as well as thrombus within the left femoral artery which was occlusive and thrombus in the right femoral artery.  The patient was started on heparin.  When the patient was evaluated this morning, there was concern for limb ischemia.  After speaking with the hospitalist, I recommended emergent transfer.  The patient has just arrived.  She does not complain of significant leg pain although she cannot move it or feel it.  She does have a history of stroke that affected the left side.   Past Medical History:  Diagnosis Date  . Frequent headaches   . Muscle pain   . Palpitations   . Sinus problem   . Stroke [facility])     No past surgical history on file.  Social History   Social History  . Marital status: Widowed    Spouse name: N/A  . Number of children: N/A  . Years of education: N/A   Occupational History  . Not on file.   Social History Main Topics  . Smoking status: Current Every Day Smoker    Packs/day: 0.50    Types: Cigarettes  . Smokeless tobacco: Never Used  . Alcohol use No  . Drug use: No  . Sexual activity: Not on file   Other Topics Concern  . Not on file   Social History Narrative  . No narrative on file    No family history on file.  Allergies as of 06/27/2017 - Review Complete 06/27/2017  Allergen Reaction Noted  . Codeine  10/08/2013    No current facility-administered medications on file prior to encounter.    Current Outpatient Prescriptions on  File Prior to Encounter  Medication Sig Dispense Refill  . amLODipine (NORVASC) 5 MG tablet     . atenolol (TENORMIN) 50 MG tablet     . betamethasone dipropionate (DIPROLENE) 0.05 % cream     . butalbital-acetaminophen-caffeine (FIORICET, ESGIC) 50-325-40 MG per tablet     . dicyclomine (BENTYL) 10 MG capsule     . furosemide (LASIX) 20 MG tablet     . gabapentin (NEURONTIN) 300 MG capsule     . HYDROcodone-acetaminophen (NORCO/VICODIN) 5-325 MG per tablet Take 1 tablet by mouth every 4 (four) hours as needed. 40 tablet 0  . levothyroxine (SYNTHROID, LEVOTHROID) 25 MCG tablet     . lisinopril (PRINIVIL,ZESTRIL) 10 MG tablet     . metoCLOPramide (REGLAN) 5 MG tablet     . omeprazole (PRILOSEC) 40 MG capsule     . promethazine (PHENERGAN) 25 MG tablet     . simvastatin (ZOCOR) 40 MG tablet        REVIEW OF SYSTEMS: Cardiovascular: No chest pain, chest pressure, palpitations, orthopnea, or dyspnea on exertion. No claudication or rest pain,  No history of DVT or phlebitis. Pulmonary: No productive cough, asthma or wheezing. Neurologic: left side weakness Hematologic: No bleeding problems or clotting disorders. Musculoskeletal: No joint pain or joint swelling. Gastrointestinal: open abdomen Genitourinary: No dysuria or hematuria. Psychiatric:: No  history of major depression. Integumentary: No rashes or ulcers. Constitutional: No fever or chills.  PHYSICAL EXAMINATION: General: The patient appears their stated age.  Vital signs are BP (!) 145/67 (BP Location: Right Arm)   Pulse 85   Temp 98.8 F (37.1 C) (Oral)   Resp 18   Ht 5\' 2"  (1.575 m)   Wt 175 lb 8 oz (79.6 kg)   SpO2 98%   BMI 32.10 kg/m  Pulmonary: Respirations are non-labored HEENT:  No gross abnormalities Abdomen: Soft and non-tender  Musculoskeletal: There are no major deformities.   Neurologic: No motor or sensory function present of the left foot. Skin: The left foot is mottled. Marland Kitchen Psychiatric: The patient has  normal affect. Cardiovascular:  Doppler right DP PT.  No doppler signals in left leg.  Palpable femoral pulses  Diagnostic Studies: I have reviewed the CT scan with radiology.  The patient has bilateral popliteal occlusions.  There is no reconstitution on the left.  There are tibial reconstitution on the right.  There also appears to be thrombus within the femoral arteries bilaterally.  Nearly occlusive on the left.   Assessment:  Ischemic left leg Acute thrombus right leg Plan: The patient has a mottled ischemic appearing left leg with decreased motor function and sensation, almost absent.  I discussed with the patient and her daughter via telephone that I think she is at high risk for limb loss.  Her only hope for limb salvage is to go the operating room and proceed with thrombectomy as well as fasciotomy on the left leg.  She does have Doppler signals on the right, however there is popliteal occlusion which is likely acute, and therefore I will proceed with embolectomy of the right leg concomitantly.  They understand this is a true emergency and we need to go the operating room immediately.     Eldridge Abrahams, M.D. [facility] [place] Office: [phone] Pager:  [phone]

## 2017-06-27 NOTE — Progress Notes (Signed)
Per CRNA, pt  Had previous abd surgery at [facility], and has percutaneous drain from right buttock area.  Dsg is CDI to drainage bag-output is starw colored liquid.

## 2017-06-27 NOTE — Op Note (Signed)
Patient name: Anna Hayes MRN: [id number] DOB: 27-Jun-1951 Sex: female  06/27/2017 Pre-operative Diagnosis: Bilateral lower extremity thrombus with ischemia to the left leg Post-operative diagnosis:  Same Surgeon:  Annamarie Major MD-Assistant:  Adele Barthel Assistant:  Linus Orn, PA Procedure:    #1:  Bilateral common femoral artery exposure   #2:  Bilateral popliteal artery exposure   #3:  Left Posterior tibial artery exposure   #4:  Thrombectomy of right CFA, PFA, CFA, popliteal, anterior and posterior tibial vessels   #5:  Thrombectomy of left CFA, PFA, SFA, popliteal, anter tibial, and posterior tibial arteries   #6:  Vein patch angioplasty of left posterior tibial artery   #7:  4 compartment left leg fasciotomy   #8:  Wound vac placement Anesthesia:  General Blood Loss:  200 cc Specimens:  Femoral, popliteal, and tibiial thrombus  Findings:  Thrombus within bilateral popliteal as well as bilateral femoral arteries.  On the left leg there was also thrombus extending below the popliteal artery.  I was unhappy with the signals at the ankle and therefore elected to expose the posterior tibial artery at the ankle.  Upon passing the Fogarty catheter there was an injury to the posterior tibial artery which I repaired with ipsilateral saphenous vein has patch angioplasty.  The patient had a palpable posterior tibial pulse.  There was not very good backbleeding from the foot.  The patient did not have well perfused muscle prior to revascularization, however after blood flow was reestablished, the muscle and the cath appeared to be adequate.  There was bulging of the muscle upon compartment release.  Indications:  The patient was transferred from [facility] this morning when he was examined and found to have a mottled ischemic left foot.  The patient had a CT angiogram yesterday which showed bilateral femoral thrombus as well as bilateral popliteal occlusion.  The patient was  examined when she got here and taken emergently to the operating room  Procedure:  The patient was identified in the holding area and taken to [place] 10  The patient was then placed supine on the table. general anesthesia was administered.  The patient was prepped and draped in the usual sterile fashion.  A time out was called and antibiotics were administered.  Oblique incisions were made in both groins.  Through this incision the common femoral, profunda femoral and superficial femoral arteries were exposed.  The patient was fully heparinized.  2 transverse arteriotomies were made on the right by Dr. Bridgett Larsson in order to adequately evacuate the thrombus which was then a duplicate profunda femoral system.  The patient has a latex allergy and therefore she was medicated with 125 mg Solu-Medrol and 25 minutes of Benadryl so that we could use the appropriate sized Fogarty catheters.   This was removed and sent to pathology with multiple passes of #3 and 4 Fogarty catheters.  The catheters were unable to be advanced beyond the popliteal artery.  There was excellent inflow and good backbleeding..  The arteriotomy was for closed primarily with 5-0 Prolene.  This Doppler signals were found and all 3 vessels..  On the left, I made a transverse arteriotomy at the femoral bifurcation.  Similarly, I passed a #4 and #3 Fogarty catheters.  I removed acute thrombus within the superficial femoral and profunda femoral arteries.  I was able to advance a Fogarty catheter all the way down to the ankle.  Multiple passes were made until no further thrombus was  evacuated.  There was excellent inflow.  The arteriotomy was closed transversely with 5-0 Prolene.  Next attention was turned towards the left below knee popliteal artery as I was not satisfied with the Doppler signals at the ankle.  I made a medial incision below the knee.  Through this incision I exposed the popliteal artery as well as the anterior tibial and tibioperoneal  trunk.  There was a pulse at the popliteal artery but not beyond this.  I made a transverse arteriotomy at the origin of the anterior tibial artery and passed a #3 and #2 Fogarty catheter down the anterior tibial and posterior tibial arteries and evacuated thrombus.  There was not good backbleeding.  I then closed this arteriotomy with 6-0 Prolene, transversely.  There was anterior tibial Doppler signal at the ankle but not a posterior tibial.  I then exposed the posterior tibial artery behind the ankle.  This is a healthy-appearing artery.  I made a transverse arteriotomy.  There was no inflow.  I passed a #2 Fogarty across the ankle into the foot.  No thrombus was evacuated.  I then passed the Fogarty proximally.  It did hang up when pulling it out I did pull some thrombus out.  I did not get backbleeding but there was bleeding from the medial more proximal incision.  I performed fasciotomies at this time, releasing the deep and superficial posterior compartments.  I identified the posterior tibial artery which had been disrupted.  I then harvested a piece of saphenous vein and performed patch angioplasty of the posterior tibial artery at this level.  After doing this, I had good pulsatile flow down at the ankle.  I then closed the posterior tibial artery transversely.  There was a good Doppler signal at this level as well as at the anterior tibial at the ankle.  I then turned my attention towards the right leg.  I then made a medial longitudinal incision below the knee.  Through this incision I exposed the popliteal artery as well as anterior tibial and tibioperoneal trunk.  I divided the anterior tibial vein.  I made a transverse arteriotomy at the level of the anterior tibial origin I passed a #2 Fogarty catheter down the posterior tibial as well as anterior tibial artery and did evacuate additional thrombus.  With this maneuver there was good backbleeding.  I major that there was good inflow.  I then closed  the arteriotomy transversely with a 6-0 Prolene.  Attention was then turned back towards the left leg.  There continued to be a good Doppler signal in the posterior tibial artery.  I then proceeded to complete the fasciotomies.  I made a lateral incision.  This incision, I released the anterior and lateral compartments.  I did not reverse the heparin.  The groin incisions were dry.  There were closed by reapproximating the femoral sheath with 2-0 Vicryl and subcutaneous tissue with multiple layers of 3-0 Vicryl followed by 4-0 Vicryl on the skin.  The popliteal incision on the right leg was similarly closed by reapproximating the fascia with 2-0 Vicryl, 3-0 Vicryl, and 4-0 Vicryl.  Dermabond was placed on these incisions.  I closed the posterior tibial incision with 2 layers of 3-0 Vicryl and Dermabond.  I then inspected the fasciotomies.  There was no significant bleeding.  A wound VAC was placed on both of the incisions.  I did make sure there was adequate tissue coverage over the arterial exposures.  The muscle did appear to  be viable.   Disposition:  To PACU stable   V. Annamarie Major, M.D. [facility] [place] Office: [phone] Pager:  [phone]

## 2017-06-27 NOTE — Transfer of Care (Signed)
Immediate Anesthesia Transfer of Care Note  Patient: Anna Hayes  Procedure(s) Performed: BILATERAL FEMORAL POPLITEAL EMBOLECTOMY (Bilateral Leg Upper) PATCH ANGIOPLASTY Left Posterior Tibial Artery (Left Leg Lower) FULL COMPARTMENT LEFT LOWER LEG FASCIOTOMY (Left Leg Lower)  Patient Location: PACU  Anesthesia Type:General  Level of Consciousness: awake, alert  and oriented  Airway & Oxygen Therapy: Patient Spontanous Breathing and Patient connected to nasal cannula oxygen  Post-op Assessment: Report given to RN and Post -op Vital signs reviewed and stable  Post vital signs: Reviewed and stable  Last Vitals:  Vitals:   06/27/17 1142  BP: (!) 145/67  Pulse: 85  Resp: 18  Temp: 37.1 C  SpO2: 98%    Last Pain:  Vitals:   06/27/17 1142  TempSrc: Oral         Complications: No apparent anesthesia complications

## 2017-06-27 NOTE — Anesthesia Procedure Notes (Signed)
Procedure Name: Intubation Date/Time: 06/27/2017 1:23 PM Performed by: Rejeana Brock L Pre-anesthesia Checklist: Patient identified, Emergency Drugs available, Suction available and Patient being monitored Patient Re-evaluated:Patient Re-evaluated prior to induction Oxygen Delivery Method: Circle System Utilized Preoxygenation: Pre-oxygenation with 100% oxygen Induction Type: IV induction Ventilation: Mask ventilation without difficulty and Oral airway inserted - appropriate to patient size Laryngoscope Size: Mac and 3 Grade View: Grade I Tube type: Oral Tube size: 7.0 mm Number of attempts: 1 Airway Equipment and Method: Stylet and Oral airway Placement Confirmation: ETT inserted through vocal cords under direct vision,  positive ETCO2 and breath sounds checked- equal and bilateral Secured at: 21 cm Tube secured with: Tape Dental Injury: Teeth and Oropharynx as per pre-operative assessment

## 2017-06-27 NOTE — Brief Op Note (Signed)
06/27/2017  9:23 PM  PATIENT:  Anna Hayes  66 y.o. female  PRE-OPERATIVE DIAGNOSIS:  Status Post Abdominal Surgery; Bilateral femoral thrombus  POST-OPERATIVE DIAGNOSIS:  Status Post Abdominal Surgery; Bilateral femoral thrombus  PROCEDURE:  Procedure(s): BILATERAL FEMORAL POPLITEAL EMBOLECTOMY (Bilateral) PATCH ANGIOPLASTY Left Posterior Tibial Artery (Left) FULL COMPARTMENT LEFT LOWER LEG FASCIOTOMY (Left)  SURGEON:  Surgeon(s) and Role:    * Serafina Mitchell, MD - Primary    * Conrad Goltry, MD - Assisting  PHYSICIAN ASSISTANT:   ASSISTANTS: Adele Barthel, Linus Orn   ANESTHESIA:   general  EBL:  200 cc BLOOD ADMINISTERED:none  DRAINS: wound vac to left leg fasciotomy   LOCAL MEDICATIONS USED:  NONE  SPECIMEN:  Source of Specimen:  femoral artery  DISPOSITION OF SPECIMEN:  PATHOLOGY  COUNTS:  YES  TOURNIQUET:  * No tourniquets in log *  DICTATION: .Dragon Dictation  PLAN OF CARE: Admit to inpatient   PATIENT DISPOSITION:  PACU - hemodynamically stable.   Delay start of Pharmacological VTE agent (>24hrs) due to surgical blood loss or risk of bleeding: starting heparin

## 2017-06-27 NOTE — H&P (Signed)
History and Physical    Anna Hayes KXF:[id number] DOB: Feb 13, 1951 DOA: 06/27/2017  PCP: Rochel Brome, MD  Patient coming from:  [facility]  Chief Complaint:    Ischemic limb  HPI: Anna Hayes is a 66 y.o. female with medical history significant of CVA with left arm weakness present to [place] with a sbo after experincing symptoms for ten days.she went to OR on 9/24 had lysis of adhesions and repair of perforated bowel.  Post op she developed AKI with peak cr of 2.1 this stabalized with ivf.  On 9/27 she started developing some fevers and worsening leukocytosis.  ble u/s was done which was neg for DVT.  Ct scan of abd/pelvis showed a developing absess which was drained by IR on 10/1.  She was started on levaquin /flagyl on 9/27.  Blood cultures from 9/27 also growing out fungus therefore casopofugin was started on 10/1 after their doctor consulted with [facility].  On the evening around 9/30 she then developed an ischemic left limb.  A stat cta with run off was performed with showed bilateral occlusion to both legs.  Pt was then arranged to be transferred here to get emergent vascular surgery which she has gotten s/p thrombectomy of bilateral vessels(see vascular report) and left leg fasciotomy.  Pt has been on heparin drip at .  Pt is now in PACU, she reports her legs are feeling better and are not as painful.  She says she " has had better days".  Vitals are stable.   Review of Systems: As per HPI otherwise 10 point review of systems negative.   Past Medical History:  Diagnosis Date  . Frequent headaches   . Muscle pain   . Palpitations   . Sinus problem   . Stroke [facility])     No past surgical history on file.  As above   reports that she has been smoking Cigarettes.  She has been smoking about 0.50 packs per day. She has never used smokeless tobacco. She reports that she does not drink alcohol or use drugs.  Allergies  Allergen Reactions  . Codeine   .  Latex     Blisters  . Penicillins     No family history on file.  No premature CAD  Prior to Admission medications   Medication Sig Start Date End Date Taking? Authorizing Provider  amLODipine (NORVASC) 5 MG tablet  09/11/13   [provider]  atenolol (TENORMIN) 50 MG tablet  08/24/13   [provider]  betamethasone dipropionate (DIPROLENE) 0.05 % cream  04/27/14   [provider]  butalbital-acetaminophen-caffeine (FIORICET, [place]) [phone] MG per tablet  09/27/13   [provider]  dicyclomine (BENTYL) 10 MG capsule  07/26/13   [provider]  furosemide (LASIX) 20 MG tablet  12/17/13   [provider]  gabapentin (NEURONTIN) 300 MG capsule  08/26/13   [provider]  HYDROcodone-acetaminophen (NORCO/VICODIN) 5-325 MG per tablet Take 1 tablet by mouth every 4 (four) hours as needed. 01/28/14   Bronson Ing, DPM  levothyroxine (SYNTHROID, LEVOTHROID) 25 MCG tablet  09/25/13   [provider]  lisinopril (PRINIVIL,ZESTRIL) 10 MG tablet  07/22/13   [provider]  metoCLOPramide (REGLAN) 5 MG tablet  08/26/13   [provider]  omeprazole (PRILOSEC) 40 MG capsule  07/26/13   [provider]  promethazine (PHENERGAN) 25 MG tablet  09/28/13   [provider]  simvastatin (ZOCOR) 40 MG tablet  07/04/13  [provider]    Physical Exam: Vitals:   06/27/17 1845 06/27/17 1900 06/27/17 2030 06/27/17 2315  BP:  125/74 125/83 (!) 146/84  Pulse: 78 78 81 86  Resp:  (!) 21 18 16   Temp: (!) 97 F (36.1 C) 97.8 F (36.6 C) 98.1 F (36.7 C) 98.3 F (36.8 C)  TempSrc:  Oral Oral Oral  SpO2: 100% 100% 100% 100%  Weight:      Height:         Constitutional: NAD, calm, comfortable Vitals:   06/27/17 1845 06/27/17 1900 06/27/17 2030 06/27/17 2315  BP:  125/74 125/83 (!) 146/84  Pulse: 78 78 81 86  Resp:  (!) 21 18 16   Temp: (!) 97 F (36.1 C) 97.8 F (36.6 C) 98.1 F  (36.7 C) 98.3 F (36.8 C)  TempSrc:  Oral Oral Oral  SpO2: 100% 100% 100% 100%  Weight:      Height:       Eyes: PERRL, lids and conjunctivae normal ENMT: Mucous membranes are moist. Posterior pharynx clear of any exudate or lesions.Normal dentition.  Neck: normal, supple, no masses, no thyromegaly Respiratory: clear to auscultation bilaterally, no wheezing, no crackles. Normal respiratory effort. No accessory muscle use.  Cardiovascular: Regular rate and rhythm, no murmurs / rubs / gallops. No extremity edema. 2+ pedal pulses. No carotid bruits.  Abdomen: no tenderness, no masses palpated. No hepatosplenomegaly. Bowel sounds positive.  Musculoskeletal: no clubbing / cyanosis. No joint deformity upper and lower extremities. Good ROM, no contractures. Normal muscle tone.  Skin: no rashes, lesions, ulcers. No induration Neurologic: CN 2-12 grossly intact. Sensation intact, DTR normal. Strength 5/5 in all 4.  Psychiatric: Normal judgment and insight. Alert and oriented x 3. Normal mood.    Labs on Admission: I have personally reviewed following labs and imaging studies  CBC:  Recent Labs Lab 06/27/17 1501 06/27/17 1803  WBC  --  27.6*  HGB 7.8* 10.4*  HCT 23.0* 30.9*  MCV  --  74.6*  PLT  --  536   Basic Metabolic Panel:  Recent Labs Lab 06/27/17 1501 06/27/17 1550  NA 138 133*  K 4.4 4.3  CL  --  111  CO2  --  19*  GLUCOSE  --  92  BUN  --  <5*  CREATININE  --  0.71  CALCIUM  --  6.8*   GFR: Estimated Creatinine Clearance: 65.8 mL/min (by C-G formula based on SCr of 0.71 mg/dL). Liver Function Tests:  Recent Labs Lab 06/27/17 1550  AST 44*  ALT 22  ALKPHOS 47  BILITOT 0.4  PROT 4.3*  ALBUMIN 1.4*    Radiological Exams on Admission: No results found.  [facility] records reviewed Old records reviewed here Case discussed with dr singh  Assessment/Plan 66 yo female who was transferred here from [place] with critical leg ischemia post op lysis of  adhesions/perf bowel repair with multiple post op complications  Principal Problem:   Critical lower limb ischemia- appreciate vascular surgery help today in the emergent care of pt.  Cont hep drip.  Further management per vascular service  Active Problems:   PAD (peripheral artery disease) ([place])- as above   Stroke [facility])- h/o has residual lue weakness   SBO (small bowel obstruction) ([place])- s/p lysis of adhesions and perf bowel repair - will need to get our surgical team involved here in the am , they have not been notified yet   Intraabdominal fluid collection absess- cont flagyl/levaquin iv which has been  ordered.  Follow up on cx data from [facility]   AKI (acute kidney injury) ([place])- peak cr at [place] was 2.1.  Normal now.   Fungemia- cont caspofugin. Had fungus growing out of blood cx.  Follow up cx data at .    Pt seen before midnight  DVT prophylaxis:  Heparin drip Code Status:  full Family Communication:  none Disposition Plan:  Per day team Consults called:   Vascular surgery Admission status:  admission   DAVID,RACHAL A MD [facility]  If 7PM-7AM, please contact night-coverage [email or web address] Password [email or web address]  06/27/2017, 11:52 PM

## 2017-06-27 NOTE — Anesthesia Procedure Notes (Signed)
Arterial Line Insertion Start/End10/10/2016 2:01 PM, 06/27/2017 2:04 PM Performed by: Janeece Riggers  Patient location: OR. Preanesthetic checklist: patient identified, IV checked, site marked, risks and benefits discussed, surgical consent, monitors and equipment checked, pre-op evaluation, timeout performed and anesthesia consent Lidocaine 1% used for infiltration Right, radial was placed Catheter size: 20 Fr Hand hygiene performed  and maximum sterile barriers used   Attempts: 1 Procedure performed without using ultrasound guided technique. Following insertion, dressing applied and Biopatch. Post procedure assessment: normal and unchanged  Patient tolerated the procedure well with no immediate complications.

## 2017-06-27 NOTE — Anesthesia Preprocedure Evaluation (Addendum)
Anesthesia Evaluation  Patient identified by MRN, date of birth, ID band Patient awake    Reviewed: Allergy & Precautions, NPO status , Patient's Chart, lab work & pertinent test results  Airway Mallampati: II  TM Distance: >3 FB Neck ROM: Full    Dental no notable dental hx.    Pulmonary neg pulmonary ROS, COPD, Current Smoker,    Pulmonary exam normal breath sounds clear to auscultation       Cardiovascular hypertension, Pt. on medications and Pt. on home beta blockers negative cardio ROS Normal cardiovascular exam Rhythm:Regular Rate:Normal     Neuro/Psych  Headaches, CVA, Residual Symptoms negative neurological ROS  negative psych ROS   GI/Hepatic negative GI ROS, Neg liver ROS, GERD  Medicated,  Endo/Other  negative endocrine ROSHypothyroidism   Renal/GU negative Renal ROS  negative genitourinary   Musculoskeletal negative musculoskeletal ROS (+)   Abdominal   Peds negative pediatric ROS (+)  Hematology negative hematology ROS (+)   Anesthesia Other Findings S/P CVA from  Years ago with Left sided weakness.    Reproductive/Obstetrics negative OB ROS                          Anesthesia Physical Anesthesia Plan  ASA: IV and emergent  Anesthesia Plan: General   Post-op Pain Management:    Induction: Intravenous  PONV Risk Score and Plan: 2 and Ondansetron, Dexamethasone and Treatment may vary due to age or medical condition  Airway Management Planned: Oral ETT  Additional Equipment:   Intra-op Plan:   Post-operative Plan: Extubation in OR  Informed Consent:   Plan Discussed with:   Anesthesia Plan Comments: (  )       Anesthesia Quick Evaluation

## 2017-06-28 ENCOUNTER — Inpatient Hospital Stay (HOSPITAL_COMMUNITY): Payer: Medicare Other

## 2017-06-28 ENCOUNTER — Encounter (HOSPITAL_COMMUNITY): Payer: Self-pay | Admitting: Surgery

## 2017-06-28 DIAGNOSIS — R109 Unspecified abdominal pain: Secondary | ICD-10-CM

## 2017-06-28 DIAGNOSIS — Z8673 Personal history of transient ischemic attack (TIA), and cerebral infarction without residual deficits: Secondary | ICD-10-CM

## 2017-06-28 DIAGNOSIS — F17210 Nicotine dependence, cigarettes, uncomplicated: Secondary | ICD-10-CM

## 2017-06-28 DIAGNOSIS — Z720 Tobacco use: Secondary | ICD-10-CM

## 2017-06-28 DIAGNOSIS — I6930 Unspecified sequelae of cerebral infarction: Secondary | ICD-10-CM

## 2017-06-28 DIAGNOSIS — G8918 Other acute postprocedural pain: Secondary | ICD-10-CM

## 2017-06-28 DIAGNOSIS — Z880 Allergy status to penicillin: Secondary | ICD-10-CM

## 2017-06-28 DIAGNOSIS — D62 Acute posthemorrhagic anemia: Secondary | ICD-10-CM

## 2017-06-28 DIAGNOSIS — B49 Unspecified mycosis: Secondary | ICD-10-CM

## 2017-06-28 DIAGNOSIS — K56609 Unspecified intestinal obstruction, unspecified as to partial versus complete obstruction: Secondary | ICD-10-CM

## 2017-06-28 DIAGNOSIS — D72829 Elevated white blood cell count, unspecified: Secondary | ICD-10-CM

## 2017-06-28 DIAGNOSIS — I739 Peripheral vascular disease, unspecified: Secondary | ICD-10-CM

## 2017-06-28 DIAGNOSIS — N179 Acute kidney failure, unspecified: Secondary | ICD-10-CM

## 2017-06-28 DIAGNOSIS — I998 Other disorder of circulatory system: Secondary | ICD-10-CM

## 2017-06-28 LAB — BASIC METABOLIC PANEL
Anion gap: 7 (ref 5–15)
BUN: 5 mg/dL — AB (ref 6–20)
CALCIUM: 6.7 mg/dL — AB (ref 8.9–10.3)
CHLORIDE: 108 mmol/L (ref 101–111)
CO2: 20 mmol/L — AB (ref 22–32)
CREATININE: 0.74 mg/dL (ref 0.44–1.00)
GFR calc Af Amer: >60 mL/min (ref >60)
GFR calc non Af Amer: >60 mL/min (ref >60)
Glucose, Bld: 107 mg/dL — ABNORMAL HIGH (ref 65–99)
Potassium: 4.7 mmol/L (ref 3.5–5.1)
SODIUM: 135 mmol/L (ref 135–145)

## 2017-06-28 LAB — POCT I-STAT 7, (LYTES, BLD GAS, ICA,H+H)
Acid-base deficit: 6 mmol/L — ABNORMAL HIGH (ref 0.0–2.0)
BICARBONATE: 20 mmol/L (ref 20.0–28.0)
CALCIUM ION: 1 mmol/L — AB (ref 1.15–1.40)
HEMATOCRIT: 32 % — AB (ref 36.0–46.0)
Hemoglobin: 10.9 g/dL — ABNORMAL LOW (ref 12.0–15.0)
O2 SAT: 99 %
PCO2 ART: 41.7 mmHg (ref 32.0–48.0)
Patient temperature: 36.9
Potassium: 4.9 mmol/L (ref 3.5–5.1)
SODIUM: 138 mmol/L (ref 135–145)
TCO2: 21 mmol/L — AB (ref 22–32)
pH, Arterial: 7.289 — ABNORMAL LOW (ref 7.350–7.450)
pO2, Arterial: 156 mmHg — ABNORMAL HIGH (ref 83.0–108.0)

## 2017-06-28 LAB — BPAM RBC
BLOOD PRODUCT EXPIRATION DATE: 201810172359
BLOOD PRODUCT EXPIRATION DATE: 201810172359
ISSUE DATE / TIME: 201810021507
ISSUE DATE / TIME: 201810021507
UNIT TYPE AND RH: 6200
Unit Type and Rh: 6200

## 2017-06-28 LAB — TYPE AND SCREEN
ABO/RH(D): A POS
ANTIBODY SCREEN: NEGATIVE
Unit division: 0
Unit division: 0

## 2017-06-28 LAB — CBC
HCT: 27.6 % — ABNORMAL LOW (ref 36.0–46.0)
HEMOGLOBIN: 9.5 g/dL — AB (ref 12.0–15.0)
MCH: 25.3 pg — AB (ref 26.0–34.0)
MCHC: 34.4 g/dL (ref 30.0–36.0)
MCV: 73.6 fL — ABNORMAL LOW (ref 78.0–100.0)
Platelets: 192 10*3/uL (ref 150–400)
RBC: 3.75 MIL/uL — ABNORMAL LOW (ref 3.87–5.11)
RDW: 16.5 % — AB (ref 11.5–15.5)
WBC: 27.2 10*3/uL — ABNORMAL HIGH (ref 4.0–10.5)

## 2017-06-28 LAB — HEPARIN LEVEL (UNFRACTIONATED): Heparin Unfractionated: 0.13 IU/mL — ABNORMAL LOW (ref 0.30–0.70)

## 2017-06-28 LAB — APTT: APTT: 70 s — AB (ref 24–36)

## 2017-06-28 MED ORDER — LEVOFLOXACIN IN D5W 500 MG/100ML IV SOLN
500.0000 mg | INTRAVENOUS | Status: DC
  Administered 2017-06-28: 500 mg via INTRAVENOUS
  Filled 2017-06-28: qty 100

## 2017-06-28 MED ORDER — FLUCONAZOLE IN SODIUM CHLORIDE 400-0.9 MG/200ML-% IV SOLN
800.0000 mg | Freq: Once | INTRAVENOUS | Status: AC
  Administered 2017-06-28: 800 mg via INTRAVENOUS
  Filled 2017-06-28: qty 400

## 2017-06-28 MED ORDER — DEXTROSE 5 % IV SOLN
2.0000 g | INTRAVENOUS | Status: DC
  Administered 2017-06-29 – 2017-07-05 (×7): 2 g via INTRAVENOUS
  Filled 2017-06-28 (×8): qty 2

## 2017-06-28 MED ORDER — SODIUM CHLORIDE 0.9 % IV SOLN
200.0000 mg | Freq: Once | INTRAVENOUS | Status: AC
  Administered 2017-06-28: 200 mg via INTRAVENOUS
  Filled 2017-06-28: qty 200

## 2017-06-28 MED ORDER — FLUCONAZOLE IN SODIUM CHLORIDE 400-0.9 MG/200ML-% IV SOLN
400.0000 mg | INTRAVENOUS | Status: DC
  Administered 2017-06-29 – 2017-07-05 (×7): 400 mg via INTRAVENOUS
  Filled 2017-06-28 (×7): qty 200

## 2017-06-28 NOTE — Progress Notes (Signed)
In and out urinary catheterization done per MD order. 600 mL urine noted.

## 2017-06-28 NOTE — Progress Notes (Signed)
ANTICOAGULATION CONSULT NOTE - Initial Consult  Pharmacy Consult for Heparin Indication: Leg ischemia   Allergies  Allergen Reactions  . Codeine   . Latex     Blisters  . Penicillins     Patient Measurements: Height: 5\' 2"  (157.5 cm) Weight: 175 lb 8 oz (79.6 kg) IBW/kg (Calculated) : 50.1 Heparin Dosing Weight: 80 kg   Vital Signs: Temp: 97.5 F (36.4 C) (10/03 1300) Temp Source: Oral (10/03 1600) BP: 121/62 (10/03 1600) Pulse Rate: 92 (10/03 1600)  Labs:  Recent Labs  06/27/17 1550 06/27/17 1616 06/27/17 1803 06/28/17 0423 06/28/17 0752  HGB  --  10.9* 10.4* 9.5*  --   HCT  --  32.0* 30.9* 27.6*  --   PLT  --   --  196 192  --   APTT  --   --  155*  --  70*  CREATININE 0.71  --   --  0.74  --     Estimated Creatinine Clearance: 65.8 mL/min (by C-G formula based on SCr of 0.74 mg/dL).   Medical History: Past Medical History:  Diagnosis Date  . Frequent headaches   . Muscle pain   . Palpitations   . Sinus problem   . Stroke [facility])     Medications:  Prescriptions Prior to Admission  Medication Sig Dispense Refill Last Dose  . amLODipine (NORVASC) 5 MG tablet Take 5 mg by mouth daily.    06/26/2017  . atenolol (TENORMIN) 50 MG tablet Take 25 mg by mouth daily.    06/26/2017 at 08ish  . betamethasone dipropionate (DIPROLENE) 0.05 % cream    06/28/2017 at 1  . butalbital-acetaminophen-caffeine (FIORICET, ESGIC) 50-325-40 MG per tablet Take 2 tablets by mouth every 6 (six) hours as needed for headache or migraine.    PRN  . dicyclomine (BENTYL) 10 MG capsule Take 10 mg by mouth 3 (three) times daily before meals.    06/26/2017  . furosemide (LASIX) 20 MG tablet Take 20 mg by mouth daily.    06/26/2017  . gabapentin (NEURONTIN) 300 MG capsule Take 300 mg by mouth 3 (three) times daily.    06/26/2017  . levothyroxine (SYNTHROID, LEVOTHROID) 25 MCG tablet Take 25 mcg by mouth daily.    06/26/2017  . lisinopril (PRINIVIL,ZESTRIL) 10 MG tablet Take 10 mg by mouth  daily.    06/26/2017  . omeprazole (PRILOSEC) 40 MG capsule Take 40 mg by mouth daily.    06/26/2017  . simvastatin (ZOCOR) 40 MG tablet Take 40 mg by mouth daily.    06/26/2017    Assessment: 10 yof s/p ex lap with lysis of adhesions and small bowel perforation repair on 9/24, admitted with ischemic left limb. He is now s/p bilateral femoral popliteal embolectomy and left leg fasciotomy on 10/2. Currently on IV heparin at 800 units/hr which was being managed by vascular. Pharmacy now consulted to manage IV heparin. H/H 9.5/27.6. Plt wnl. APTT this AM was on lower end of therapeutic range at 70. HL this evening is subtherapeutic at 0.13.   Goal of Therapy:  Heparin level 0.3-0.7 units/ml Monitor platelets by anticoagulation protocol: Yes   Plan:  -Increase IV Heparin to 950 units/hr given sub-therapeutic. No bolus -F/u AM heparin level -Monitor CBC and s/s of bleeding   Albertina Parr, PharmD., BCPS Clinical Pharmacist Pager [phone]

## 2017-06-28 NOTE — Care Management Note (Addendum)
Case Management Note Marvetta Gibbons RN, BSN Unit 4E-Case Manager [phone]  Patient Details  Name: Anna Hayes MRN: [id number] Date of Birth: [birth year]  Subjective/Objective:   Pt. Originally admitted to [facility] 9/22 with small bowel obstruction s/p exp. Lap with lysis of adhesions and repair of small bowel perforation. Post op  had fevers, leukocytosis and CT abdomen and pelvis 9/29 revealed pelvic abscess had placement of percutaneous drain October 1. +  fungemia on 1/2 blood culture 9/27 and started on antifungal therapy. Developed critical left leg ischemia- and emergently transferred to [facility] where she underwent thrombectomy/embolectomy and fasciotomy with wound VAC placement October 2.   Action/Plan: PTA pt lived at home- PT/OT evals pending- CM to follow for d/c needs-   Expected Discharge Date:                  Expected Discharge Plan:     In-House Referral:     Discharge planning Services  CM Consult  Post Acute Care Choice:    Choice offered to:     DME Arranged:    DME Agency:     HH Arranged:    [place] Agency:     Status of Service:  In process, will continue to follow  If discussed at Long Length of Stay Meetings, dates discussed:    Discharge Disposition:   Additional Comments:  Dawayne Patricia, RN 06/28/2017, 10:53 AM

## 2017-06-28 NOTE — Progress Notes (Signed)
I will follow pt's progress to assist with planning dispo. Pt currently not at a level to tolerate intense therapies for me to pursue insurance approval. I will follow up with pt and family. [phone]

## 2017-06-28 NOTE — Progress Notes (Signed)
Inpatient Rehabilitation  Per PT/OT request patient was screened by Gunnar Fusi for appropriateness for an Inpatient Acute Rehab consult.  At this time we are recommending an Inpatient Rehab consult.  MD text paged to notify; please order if you are agreeable.    Carmelia Roller., CCC/SLP Admission Coordinator  [place]  Cell [phone]

## 2017-06-28 NOTE — Anesthesia Postprocedure Evaluation (Signed)
Anesthesia Post Note  Patient: Anna Hayes  Procedure(s) Performed: BILATERAL FEMORAL POPLITEAL EMBOLECTOMY (Bilateral Leg Upper) PATCH ANGIOPLASTY Left Posterior Tibial Artery (Left Leg Lower) FULL COMPARTMENT LEFT LOWER LEG FASCIOTOMY (Left Leg Lower)     Patient location during evaluation: PACU Anesthesia Type: General Level of consciousness: awake and patient cooperative Pain management: pain level controlled Vital Signs Assessment: post-procedure vital signs reviewed and stable Respiratory status: spontaneous breathing, nonlabored ventilation, respiratory function stable and patient connected to nasal cannula oxygen Cardiovascular status: stable Postop Assessment: no apparent nausea or vomiting Anesthetic complications: no    Last Vitals:  Vitals:   06/28/17 1300 06/28/17 1600  BP:  121/62  Pulse: 97 92  Resp: 13 12  Temp: (!) 36.4 C   SpO2: 100% 98%    Last Pain:  Vitals:   06/28/17 1600  TempSrc: Oral  PainSc:                  Feliciano Wynter

## 2017-06-28 NOTE — Consult Note (Signed)
Physical Medicine and Rehabilitation Consult  Reason for Consult: Deficits in mobility and ability to carry out ADL tasks due to BLE ischemia.  Referring Physician: Dr. Sarajane Jews.    HPI: Anna Hayes is a 66 y.o. female with history of palpitations, PAD, tobacco use, CVA with residual Left HP who was admitted to [facility] with SBO who underwent lysis of adhesions and repair of perforated bowel 06/19/17. History taken from chart review. Hospital course complicated by fevers with leucocytosis due to s/p drain placement 10/1, fungemia and development of ischemic LLE due to occlusion of left popliteal and femoral arteries as well as thrombus in right femoral artery. She was started on IV heparin and transferred to [facility] on 10/2 due as patient with significant pain with motor and sensory deficits. She was taken to OR for BLE thrombectomy with left 4 compartment fasciotomy by Dr. Trula Slade. Therapy evaluations done today revealing difficulty WB on LE with instability RLE and  pain affecting mobility. CIR recommended due to deficits in mobility and ability to carry out ADL tasks.    Review of Systems  Constitutional: Negative for chills and fever.  HENT: Negative for hearing loss and tinnitus.   Eyes: Negative for blurred vision and double vision.  Respiratory: Negative for cough and wheezing.   Cardiovascular: Positive for leg swelling. Negative for chest pain and palpitations.  Gastrointestinal: Positive for constipation (No BM for 2 days). Negative for abdominal pain, heartburn, nausea and vomiting.  Genitourinary: Negative for dysuria and urgency.  Musculoskeletal: Positive for myalgias. Negative for back pain and neck pain.  Skin: Negative for itching and rash.  Neurological: Positive for sensory change and focal weakness. Negative for dizziness and headaches.  Psychiatric/Behavioral: The patient does not have insomnia.   All other systems reviewed and are negative.      Past Medical History:  Diagnosis Date  . Frequent headaches   . Muscle pain   . Palpitations   . Sinus problem   . Stroke [facility])     No past surgical history on file.    No family history on file.    Social History:  Lives with family. Used to work as an Engineer, production. Daughter works at night and grandson works days.  Independent and helps with cooking and housework. She reports that she has been smoking Cigarettes.  She has been smoking about 0.50 packs per day. She has never used smokeless tobacco. She reports that she does not drink alcohol or use drugs.     Allergies  Allergen Reactions  . Codeine   . Latex     Blisters  . Penicillins     Medications Prior to Admission  Medication Sig Dispense Refill  . amLODipine (NORVASC) 5 MG tablet Take 5 mg by mouth daily.     Marland Kitchen atenolol (TENORMIN) 50 MG tablet Take 25 mg by mouth daily.     . betamethasone dipropionate (DIPROLENE) 0.05 % cream     . butalbital-acetaminophen-caffeine (FIORICET, ESGIC) 50-325-40 MG per tablet Take 2 tablets by mouth every 6 (six) hours as needed for headache or migraine.     . dicyclomine (BENTYL) 10 MG capsule Take 10 mg by mouth 3 (three) times daily before meals.     . furosemide (LASIX) 20 MG tablet Take 20 mg by mouth daily.     Marland Kitchen gabapentin (NEURONTIN) 300 MG capsule Take 300 mg by mouth 3 (three) times daily.     Marland Kitchen levothyroxine (SYNTHROID, LEVOTHROID) 25 MCG tablet  Take 25 mcg by mouth daily.     Marland Kitchen lisinopril (PRINIVIL,ZESTRIL) 10 MG tablet Take 10 mg by mouth daily.     Marland Kitchen omeprazole (PRILOSEC) 40 MG capsule Take 40 mg by mouth daily.     . simvastatin (ZOCOR) 40 MG tablet Take 40 mg by mouth daily.       Home: Home Living Family/patient expects to be discharged to:: Private residence Living Arrangements: Children, Other relatives Available Help at Discharge: Family, Available 24 hours/day Type of Home: House Home Access: Stairs to enter [employer] of Steps: 5 Entrance  Stairs-Rails: None Home Layout: One level Bathroom Shower/Tub: Chiropodist: Standard Bathroom Accessibility: Yes Home Equipment: Cane - quad, Shower seat  Functional History: Prior Function Level of Independence: Independent with assistive device(s) Comments: uses quad cane for ambulation, attends Silver sneakers, independent with bathing and grooming  Functional Status:  Mobility: Bed Mobility Overal bed mobility: Needs Assistance Bed Mobility: Supine to Sit, Rolling Rolling: Min assist Supine to sit: Mod assist, +2 for physical assistance General bed mobility comments: min A for rolling to L x2 for bed pan placement and removal, modA for trunk to upright, LE management off bed and pad scoot of hips to EoB Transfers Overall transfer level: Needs assistance Equipment used: 1 person hand held assist, 2 person hand held assist Transfers: Lateral/Scoot Transfers, Squat Pivot Transfers Squat pivot transfers: Total assist  Lateral/Scoot Transfers: +2 physical assistance, Min assist, Mod assist General transfer comment: pt initially attempted squat pivot transfer but unable to get her weight over her R LE and R floot slid forward on floor, on 2nd attempt PT tried to block R knee but too painful, dropped arm of recliner and bed and pt was able to perform lateral scoot to recliner initially with muliple scoots of hips initially modAx2, but by last scoot only required minA of one to block R foot      ADL: ADL Overall ADL's : Needs assistance/impaired Eating/Feeding: Set up, Sitting Grooming: Set up, Sitting Upper Body Bathing: Min guard, Sitting Lower Body Bathing: Maximal assistance, Sitting/lateral leans Upper Body Dressing : Min guard, Sitting Lower Body Dressing: Maximal assistance, Sitting/lateral leans Toilet Transfer: +2 for physical assistance, Moderate assistance (lateral scoot; attempted squat pivot) Toilet Transfer Details (indicate cue type and reason):  Unable to complete squat-pivot transfer.  Toileting- Clothing Manipulation and Hygiene: Maximal assistance, Sitting/lateral lean General ADL Comments: Pt very motivated to participate and demonstrates an excellent outlook.   Cognition: Cognition Overall Cognitive Status: Within Functional Limits for tasks assessed Orientation Level: Oriented X4 Cognition Arousal/Alertness: Awake/alert Behavior During Therapy: WFL for tasks assessed/performed Overall Cognitive Status: Within Functional Limits for tasks assessed   Blood pressure 116/69, pulse 97, temperature (!) 97.5 F (36.4 C), temperature source Oral, resp. rate 13, height 5\' 2"  (1.575 m), weight 79.6 kg (175 lb 8 oz), SpO2 100 %. Physical Exam  Nursing note and vitals reviewed. Constitutional: She is oriented to person, place, and time. She appears well-developed and well-nourished. No distress.  HENT:  Head: Normocephalic and atraumatic.  Eyes: Pupils are equal, round, and reactive to light. Conjunctivae and EOM are normal.  Neck: Normal range of motion. Neck supple.  Cardiovascular: Normal rate and regular rhythm.   Respiratory: Effort normal and breath sounds normal. No stridor. No respiratory distress. She has no wheezes.  GI: Soft. She exhibits distension.  Musculoskeletal: She exhibits edema and tenderness.  Left foot dark and mottle appearing with coolness.   Neurological: She is alert  and oriented to person, place, and time.  Left facial weakness with minimal dysarthria.  LUE with edema  Left hand with contracture.  Able to follow basic commands without difficulty.  Motor: RUE: 5/5 proximal to distal LUE: 0/5 proximal to distal with tone and contracture RLE: HF, KE 2/5, ADF/PF 3/5 (pain inhibition) LLE: HF 1/5 (pain inhibition)  Skin: Skin is warm and dry. She is not diaphoretic.  RLE with multiple incisions and pain with attempts at ROM.  LLE with VAC in place and foot drop.  Psychiatric: She has a normal mood and  affect. Her behavior is normal. Judgment and thought content normal.    Results for orders placed or performed during the hospital encounter of 06/27/17 (from the past 24 hour(s))  Comprehensive metabolic panel     Status: Abnormal   Collection Time: 06/27/17  3:50 PM  Result Value Ref Range   Sodium 133 (L) 135 - 145 mmol/L   Potassium 4.3 3.5 - 5.1 mmol/L   Chloride 111 101 - 111 mmol/L   CO2 19 (L) 22 - 32 mmol/L   Glucose, Bld 92 65 - 99 mg/dL   BUN <5 (L) 6 - 20 mg/dL   Creatinine, Ser 0.71 0.44 - 1.00 mg/dL   Calcium 6.8 (L) 8.9 - 10.3 mg/dL   Total Protein 4.3 (L) 6.5 - 8.1 g/dL   Albumin 1.4 (L) 3.5 - 5.0 g/dL   AST 44 (H) 15 - 41 U/L   ALT 22 14 - 54 U/L   Alkaline Phosphatase 47 38 - 126 U/L   Total Bilirubin 0.4 0.3 - 1.2 mg/dL   GFR calc non Af Amer >60 >60 mL/min   GFR calc Af Amer >60 >60 mL/min   Anion gap 3 (L) 5 - 15  I-STAT 7, (LYTES, BLD GAS, ICA, H+H)     Status: Abnormal   Collection Time: 06/27/17  4:16 PM  Result Value Ref Range   pH, Arterial 7.289 (L) 7.350 - 7.450   pCO2 arterial 41.7 32.0 - 48.0 mmHg   pO2, Arterial 156.0 (H) 83.0 - 108.0 mmHg   Bicarbonate 20.0 20.0 - 28.0 mmol/L   TCO2 21 (L) 22 - 32 mmol/L   O2 Saturation 99.0 %   Acid-base deficit 6.0 (H) 0.0 - 2.0 mmol/L   Sodium 138 135 - 145 mmol/L   Potassium 4.9 3.5 - 5.1 mmol/L   Calcium, Ion 1.00 (L) 1.15 - 1.40 mmol/L   HCT 32.0 (L) 36.0 - 46.0 %   Hemoglobin 10.9 (L) 12.0 - 15.0 g/dL   Patient temperature 36.9 C    Sample type ARTERIAL   CBC     Status: Abnormal   Collection Time: 06/27/17  6:03 PM  Result Value Ref Range   WBC 27.6 (H) 4.0 - 10.5 K/uL   RBC 4.14 3.87 - 5.11 MIL/uL   Hemoglobin 10.4 (L) 12.0 - 15.0 g/dL   HCT 30.9 (L) 36.0 - 46.0 %   MCV 74.6 (L) 78.0 - 100.0 fL   MCH 25.1 (L) 26.0 - 34.0 pg   MCHC 33.7 30.0 - 36.0 g/dL   RDW 16.1 (H) 11.5 - 15.5 %   Platelets 196 150 - 400 K/uL  APTT     Status: Abnormal   Collection Time: 06/27/17  6:03 PM  Result Value  Ref Range   aPTT 155 (H) 24 - 36 seconds  CBC     Status: Abnormal   Collection Time: 06/28/17  4:23 AM  Result Value Ref  Range   WBC 27.2 (H) 4.0 - 10.5 K/uL   RBC 3.75 (L) 3.87 - 5.11 MIL/uL   Hemoglobin 9.5 (L) 12.0 - 15.0 g/dL   HCT 27.6 (L) 36.0 - 46.0 %   MCV 73.6 (L) 78.0 - 100.0 fL   MCH 25.3 (L) 26.0 - 34.0 pg   MCHC 34.4 30.0 - 36.0 g/dL   RDW 16.5 (H) 11.5 - 15.5 %   Platelets 192 150 - 400 K/uL  Basic metabolic panel     Status: Abnormal   Collection Time: 06/28/17  4:23 AM  Result Value Ref Range   Sodium 135 135 - 145 mmol/L   Potassium 4.7 3.5 - 5.1 mmol/L   Chloride 108 101 - 111 mmol/L   CO2 20 (L) 22 - 32 mmol/L   Glucose, Bld 107 (H) 65 - 99 mg/dL   BUN 5 (L) 6 - 20 mg/dL   Creatinine, Ser 0.74 0.44 - 1.00 mg/dL   Calcium 6.7 (L) 8.9 - 10.3 mg/dL   GFR calc non Af Amer >60 >60 mL/min   GFR calc Af Amer >60 >60 mL/min   Anion gap 7 5 - 15  APTT     Status: Abnormal   Collection Time: 06/28/17  7:52 AM  Result Value Ref Range   aPTT 70 (H) 24 - 36 seconds   Dg Abd Portable 1v  Result Date: 06/28/2017 CLINICAL DATA:  Abdominal pain and nausea. EXAM: PORTABLE ABDOMEN - 1 VIEW COMPARISON:  None. FINDINGS: The bowel gas pattern is normal. No radio-opaque calculi or other significant radiographic abnormality are seen. Pigtail drainage catheter seen in the pelvis. Enteric contrast from prior CT. Degenerative change both hips. IMPRESSION: No obstruction or visible free air. Electronically Signed   By: Staci Righter M.D.   On: 06/28/2017 13:47    Assessment/Plan: Diagnosis: Debility Labs independently reviewed.  Records reviewed and summated above.  1. Does the need for close, 24 hr/day medical supervision in concert with the patient's rehab needs make it unreasonable for this patient to be served in a less intensive setting? Yes 2. Co-Morbidities requiring supervision/potential complications: PAD (transition from heparin ggt when appropriate), tobacco use  (counsel), CVA with residual Left HP (consider anti-spasticity meds), fevers, ABLA (transfuse if necessary to ensure appropriate perfusion for increased activity tolerance), leukocytosis (cont to monitor for signs and symptoms of infection, further workup if indicated, narrow IV abx, including Vanc when appropriate), post-op pain (Biofeedback training with therapies to help reduce reliance on opiate pain medications, particularly IV morphine, monitor pain control during therapies, and sedation at rest and titrate to maximum efficacy to ensure participation and gains in therapies) 3. Due to safety, skin/wound care, disease management, pain management and patient education, does the patient require 24 hr/day rehab nursing? Yes 4. Does the patient require coordinated care of a physician, rehab nurse, PT (1-2 hrs/day, 5 days/week) and OT (1-2 hrs/day, 5 days/week) to address physical and functional deficits in the context of the above medical diagnosis(es)? Yes Addressing deficits in the following areas: balance, endurance, locomotion, strength, transferring, bathing, dressing, toileting and psychosocial support 5. Can the patient actively participate in an intensive therapy program of at least 3 hrs of therapy per day at least 5 days per week? No 6. The potential for patient to make measurable gains while on inpatient rehab is excellent 7. Anticipated functional outcomes upon discharge from inpatient rehab are mod assist  with PT, mod assist with OT, n/a with SLP. 8. Estimated rehab length of  stay to reach the above functional goals is: 20-25 days. 9. Anticipated D/C setting: Other 10. Anticipated post D/C treatments: SNF 11. Overall Rehab/Functional Prognosis: good  RECOMMENDATIONS: This patient's condition is appropriate for continued rehabilitative care in the following setting: CIR if/when able to tolerate 3 hours of therapy/day. Patient has agreed to participate in recommended program. Yes Note that  insurance prior authorization may be required for reimbursement for recommended care.  Comment: Rehab Admissions Coordinator to follow up.  Delice Lesch, MD, ABPMR [facility] [place], [place] 06/28/2017

## 2017-06-28 NOTE — Progress Notes (Signed)
Vascular surgeon at bedside. Assessed extremities and pulses. Plan of care reviewed with patient.

## 2017-06-28 NOTE — Consult Note (Signed)
[facility] for Infectious Disease    Date of Admission:  06/27/2017     Total days of antibiotics  7  Fluconazole 10/3 -   Levofloxacin   Metronidazole   Vancomycin intra op 10/2  Cefuroxime intra op 10/2  Anidulafungin                 Reason for Consult:  Fungemia, Intra-pelvic abscess s/p SBO/ex lap   Referring Provider: Sarajane Jews Primary Care Provider: Rochel Brome, MD  Assessment: Fungemia   C. Tropicalis 9/27 BCx @ [facility] 1/2  Intra-abdominal/pelvic abscess s/p ex lap and percutaneous drain   Cx with GNR none predominate   RLQ percutaneous drain in place with minimal yellow thin drainage.   Ileus?   Increased nausea. No flatus today.   KUB pending  Ischemic Leg, s/p revascularization and fasciotomy   Doppler signals thready. No pain.   PCN allergy - swelling / hives.   Plan: 1. Stop anidulafungin - start Fluconazole for C. Tropicalis.  2. Follow LFTs on fluconazole.   3. Stop Levaquin and start ceftriaxone (avoid too many QT prolonging agents). Tolerated cephalosporin from intra-op dosing.  4. Will need to follow up with CT scan with perc drain in place to ensure adequate drainage of abscess to help with treatment duration with CTX/Flagyl.  5. Needs opthalmology consult with fungemia to r/o ocular involvement.    Janene Madeira, MSN, NP-C [facility] for Infectious Disease [facility] Pager: [phone]  06/28/2017  12:23 PM     Principal Problem:   Critical lower limb ischemia Active Problems:   PAD (peripheral artery disease) ([place])   Stroke ([place])   SBO (small bowel obstruction) ([place])   Intraabdominal fluid collection absess   Fungemia   . amLODipine  5 mg Oral Daily  . docusate sodium  100 mg Oral Daily  . gabapentin  300 mg Oral BID  . lisinopril  10 mg Oral Daily  . pantoprazole  40 mg Oral Daily  . simvastatin  20 mg Oral q1800    HPI: Anna Hayes is a 66 y.o. female with pmhx  significant for stroke, palpitations. Was originally admitted to [facility] 9/22 with SBO and underwent ex-lap for lysis of adhesions and repair of small bowel perforation. Post op course was complicated by fevers, leukocytosis. Blood cultures on 9/27 + 1/2 for yeast - started on anidulafungin. CT of abdomen/pelvis 9/29 - pelvic abscess. Percutaneous drain was placed 10/1. Later developed critical limb ischemia of her left leg and was transferred to [facility] emergently for bilateral femoral popliteal embolectomy, patch angioplasty of her left posterior tibial artery and left leg fasciotomy for compartment syndrome with VAC placement on 10/2 ([place]).   [facility] [phone] was called and discussed culture data:  BCx 9/27 >>+ 1/2 for C. Tropicalis  BCx 10/1 >> NG  10/1 >> intra-abdominal abscess multiple GNR, none predominate  She "feels like crap" today. She does not have any cardiac device in place or any other prosthetics. No vision changes. Reports she is having trouble with nausea. No flatus over the last 2 days. No pain in her foot pre or post surgery. Has never had history of clots before.   Review of Systems: Review of Systems  Constitutional: Positive for malaise/fatigue. Negative for fever.  Respiratory: Negative for cough, sputum production and shortness of breath.   Cardiovascular: Positive for leg swelling. Negative for chest pain.  Gastrointestinal: Positive for abdominal  pain and nausea. Negative for diarrhea and vomiting.  Genitourinary: Negative for dysuria.  Musculoskeletal: Negative for myalgias.  Skin: Negative for itching and rash.  Neurological: Negative for headaches.  All other systems reviewed and are negative.   Past Medical History:  Diagnosis Date  . Frequent headaches   . Muscle pain   . Palpitations   . Sinus problem   . Stroke [facility])     Social History  Substance Use Topics  . Smoking status: Current Every Day Smoker    Packs/day: 0.50      Types: Cigarettes  . Smokeless tobacco: Never Used  . Alcohol use No    No family history on file. Allergies  Allergen Reactions  . Codeine   . Latex     Blisters  . Penicillins     OBJECTIVE: Blood pressure 133/74, pulse (!) 104, temperature 98.5 F (36.9 C), temperature source Oral, resp. rate 14, height 5\' 2"  (1.575 m), weight 175 lb 8 oz (79.6 kg), SpO2 100 %.  Physical Exam  Constitutional: She is oriented to person, place, and time and well-developed, well-nourished, and in no distress.  HENT:  Mouth/Throat: No oropharyngeal exudate.  Eyes: No scleral icterus.  Cardiovascular: Normal rate, regular rhythm, normal heart sounds and intact distal pulses.   Pulmonary/Chest: Effort normal and breath sounds normal. No respiratory distress. She has no rales.  Abdominal: Soft. She exhibits distension. Bowel sounds are not hypoactive. There is no tenderness.  Musculoskeletal:  Lateral/medial fasciotomies in place to LLE with wound VAC engaged. Minimal serosang drainage. Distal forefoot still discolored and cool. Doppler signal thready (DP). Adequate sensation.   Lymphadenopathy:    She has no cervical adenopathy.  Neurological: She is alert and oriented to person, place, and time.  Skin: Skin is warm and dry.    Lab Results Lab Results  Component Value Date   WBC 27.2 (H) 06/28/2017   HGB 9.5 (L) 06/28/2017   HCT 27.6 (L) 06/28/2017   MCV 73.6 (L) 06/28/2017   PLT 192 06/28/2017    Lab Results  Component Value Date   CREATININE 0.74 06/28/2017   BUN 5 (L) 06/28/2017   NA 135 06/28/2017   K 4.7 06/28/2017   CL 108 06/28/2017   CO2 20 (L) 06/28/2017    Lab Results  Component Value Date   ALT 22 06/27/2017   AST 44 (H) 06/27/2017   ALKPHOS 47 06/27/2017   BILITOT 0.4 06/27/2017     Microbiology: Oval [place])  BCx 9/27 - 1/1 candida tropicalis  BCx 10/1 - NG x 2 Pelvic Abscess Percutaneous Aspirate 10/1 - GNR none predominate   Janene Madeira, MSN,  NP-C [facility] for Infectious [place] Pager: [phone]  06/28/2017 12:09 PM

## 2017-06-28 NOTE — Progress Notes (Signed)
PROGRESS NOTE  Anna Hayes ZOX:[id number] DOB: [birth year] DOA: 06/27/2017 PCP: Rochel Brome, MD  Brief Narrative: From discharge summary: 66 year old woman presented with abdominal pain and was admitted to [facility] 9/22. Found to have small bowel obstruction. Underwent expiratory laparotomy with lysis of adhesions and release of small bowel obstruction, repair of small bowel perforation. Subsequently had fevers, leukocytosis and CT abdomen and pelvis 9/29 revealed pelvic abscess and underwent successful placement of percutaneous drain October 1. Also noted to have fungemia on 1/2 blood culture 9/27 and started on antifungal therapy. She subsequently developed left lower stomach pain and discomfort October 1 and underwent CT angiogram which revealed bilateral arterial blockage both lower extremities, was started on heparin and emergently transferred to [facility] where she underwent thrombectomy and fasciotomy October 2.  Assessment/Plan Critical left leg ischemia, status post surgery 10/2 including embolectomy and fasciotomy. -Management per vascular surgery.  Status post laparotomy, lysis of adhesions and repair of perforated bowel -Will consult general surgery for ongoing care.  Abdominal abscess, status post percutaneous drain placement October 1. On treatment with Levaquin and Flagyl since 9/27. -Continue antibiotics. Further recommendations per surgery.  Fungemia, started on caspofungin 10/1 -Infectious disease consultation for further recommendations.  Microcytic anemia, likely multifactorial including perioperative blood loss. -Follow clinically. Outpatient evaluation suggested.  Status post AKI -Renal function is normal.  PMH stroke with left upper and left lower extremity weakness, left facial droop.  DVT prophylaxis: heparin infusion  Code Status: full Family Communication: none Disposition Plan: pending    Murray Hodgkins, MD  [facility] Direct  contact: [phone] --Via amion app OR  --[email or web address]; password [email or web address]  7PM-7AM contact night coverage as above 06/28/2017, 10:45 AM  LOS: 1 day   Consultants:  Vascular surgery  General surgery  Procedures:  Exploratory laparotomy 9/24 with lysis of adhesions and release of small bowel obstruction, repair of small bowel perforation.  Percutaneous drain placement right buttock for pelvic abscess October 1.   10/2:   BILATERAL FEMORAL POPLITEAL EMBOLECTOMY (Bilateral)  PATCH ANGIOPLASTY Left Posterior Tibial Artery (Left)  FULL COMPARTMENT LEFT LOWER LEG FASCIOTOMY (Left)  Antimicrobials: Anidulafungin 10/2 >> Levaquin 10/2 Metronidazole 10/2  Interval history/Subjective: Feels okay. Some abdominal pain. No pain in leg. Breathing fine. Tolerated diet this morning.  Objective: Vitals: Afebrile, 98.5, 14, 104, 133/74, 100% on 1.5 L nasal cannula.  Exam:     Constitutional: Appears calm, comfortable.  Eyes. Pupils, irises, lids appear unremarkable.  ENT. Grossly normal hearing, lips, tongue. Left-sided facial droop is noted.  Respiratory. Clear to auscultation bilaterally. No wheezes, rales or rhonchi. Normal respiratory effort.  Cardiovascular. Regular rate and rhythm. No murmur, rub or gallop. Telemetry sinus tachycardia. Left lower extremity edema noted.  Psychiatric. Grossly normal mood and affect. Speech fluent and appropriate.  Abdomen. Midline bandage noted in place.  Drain noted with tubing extending to the backside.   I have personally reviewed the following:   Labs:  Basic metabolic panel unremarkable  WBC 27.2, hemoglobin 9.5, platelets 192  Review and summation of old records:  Discharge summary: 66 year old woman presented with abdominal pain and was admitted to [facility] 9/22. Found to have small bowel obstruction. Underwent expiratory laparotomy with lysis of adhesions and release of small bowel obstruction, repair of small bowel  perforation. Subsequently had fevers, leukocytosis and CT abdomen and pelvis 9/29 revealed pelvic abscess and underwent successful placement of percutaneous drain October 1. Also noted to have fungemia on 1/2 blood culture 9/27 and started on antifungal therapy.  She subsequently developed left lower stomach pain and discomfort October 1 and underwent CT angiogram which revealed bilateral arterial blockage both lower extremities, was started on heparin and emergently transferred to [facility].  Operative report 9/24: Small bowel obstruction. Exploratory laparotomy, lysis of adhesions with release of small bowel obstruction, repair small bowel perforation.  CT abdomen and pelvis 9/29: Bilocular fluid collection in the pelvis. 0.4 x 8.4 x 3.7.  10/1 CT-guided right transgluteal drain placement.    Scheduled Meds: . amLODipine  5 mg Oral Daily  . docusate sodium  100 mg Oral Daily  . gabapentin  300 mg Oral BID  . lisinopril  10 mg Oral Daily  . pantoprazole  40 mg Oral Daily  . simvastatin  20 mg Oral q1800   Continuous Infusions: . sodium chloride    . sodium chloride    . anidulafungin    . heparin 800 Units/hr (06/27/17 1812)  . levofloxacin (LEVAQUIN) IV Stopped (06/28/17 0569)  . magnesium sulfate 1 - 4 g bolus IVPB    . metronidazole Stopped (06/28/17 0727)  . vancomycin 1,000 mg (06/28/17 1010)    Principal Problem:   Critical lower limb ischemia Active Problems:   PAD (peripheral artery disease) (HCC)   Stroke (HCC)   SBO (small bowel obstruction) (HCC)   Intraabdominal fluid collection absess   Fungemia   LOS: 1 day

## 2017-06-28 NOTE — Evaluation (Signed)
Physical Therapy Evaluation Patient Details Name: Anna Hayes MRN: [id number] DOB: [birth year] Today's Date: 06/28/2017   History of Present Illness  Pt is a 66 y.o. female with medical history significant of CVA with left arm weakness present to [place] with a sbo after experincing symptoms for ten days.she went to OR on 9/24 had lysis of adhesions and repair of perforated bowel.  Post op she developed AKI with peak cr of 2.1 this stabalized with ivf.  On 9/27 she started developing some fevers and worsening leukocytosis.  ble u/s was done which was neg for DVT.  Ct scan of abd/pelvis showed a developing absess which was drained by IR on 10/1.  She was started on levaquin /flagyl on 9/27.  Blood cultures from 9/27 also growing out fungus therefore casopofugin was started on 10/1 after their doctor consulted with [facility].  On the evening around 9/30 she then developed an ischemic left limb.  A stat cta with run off was performed with showed bilateral occlusion to both legs.  Pt was then arranged to be transferred here to get emergent vascular surgery which she has gotten s/p thrombectomy of bilateral vessels(see vascular report) and left leg fasciotomy.   Clinical Impression  Patient is s/p above surgery resulting in functional limitations due to the deficits listed below (see PT Problem List). Pt is limited by pain and decreased sensation in L LE. Pt currently, modAx2 for bed mobility and modAx2 for transfers. Patient will benefit from skilled PT to increase their independence and safety with mobility to allow discharge to the venue listed below.       Follow Up Recommendations CIR    Equipment Recommendations  Other (comment) (to be determined at next venue)    Recommendations for Other Services Rehab consult     Precautions / Restrictions Precautions Precautions: None Restrictions Weight Bearing Restrictions: No      Mobility  Bed Mobility Overal bed mobility: Needs  Assistance Bed Mobility: Supine to Sit;Rolling Rolling: Min assist   Supine to sit: Mod assist;+2 for physical assistance     General bed mobility comments: min A for rolling to L x2 for bed pan placement and removal, modA for trunk to upright, LE management off bed and pad scoot of hips to EoB  Transfers Overall transfer level: Needs assistance Equipment used: 1 person hand held assist;2 person hand held assist Transfers: Lateral/Scoot Transfers;Squat Pivot Transfers     Squat pivot transfers: Total assist    Lateral/Scoot Transfers: +2 physical assistance;Min assist;Mod assist General transfer comment: pt initially attempted squat pivot transfer but unable to get her weight over her R LE and R floot slid forward on floor, on 2nd attempt PT tried to block R knee but too painful, dropped arm of recliner and bed and pt was able to perform lateral scoot to recliner initially with muliple scoots of hips initially modAx2, but by last scoot only required minA of one to block R foot      Balance Overall balance assessment: Needs assistance Sitting-balance support: Feet supported;No upper extremity supported Sitting balance-Leahy Scale: Fair                                       Pertinent Vitals/Pain Pain Assessment: Faces Faces Pain Scale: Hurts even more Pain Location: L LE Pain Descriptors / Indicators: Grimacing;Guarding Pain Intervention(s): Monitored during session;Limited activity within patient's tolerance;RN gave pain meds  during session    Home Living Family/patient expects to be discharged to:: Private residence Living Arrangements: Children;Other relatives Available Help at Discharge: Family;Available 24 hours/day Type of Home: House Home Access: Stairs to enter Entrance Stairs-Rails: None Entrance Stairs-Number of Steps: 5 Home Layout: One level Home Equipment: Cane - quad;Shower seat      Prior Function Level of Independence: Independent with  assistive device(s)         Comments: uses quad cane for ambulation, attends Silver sneakers, independent with bathing and grooming      Hand Dominance   Dominant Hand: Right    Extremity/Trunk Assessment   Upper Extremity Assessment Upper Extremity Assessment: Defer to OT evaluation    Lower Extremity Assessment Lower Extremity Assessment: LLE deficits/detail;Generalized weakness;RLE deficits/detail RLE Deficits / Details: R hip and knee ROM limited by groin incision, strength grossly 3/5 RLE: Unable to fully assess due to pain LLE Deficits / Details: L LE ROM limited secondary to surgical pain and previous CVA, d LLE: Unable to fully assess due to pain LLE Sensation: decreased light touch (decreased below knee) LLE Coordination: decreased fine motor;decreased gross motor       Communication   Communication: No difficulties  Cognition Arousal/Alertness: Awake/alert Behavior During Therapy: WFL for tasks assessed/performed Overall Cognitive Status: Within Functional Limits for tasks assessed                                        General Comments General comments (skin integrity, edema, etc.): Pt on 2L O2 via nasal cannula at entry with SaO2 of 100%, does not wear at home and was removed for transfer, SaO2 remained at 98%O2 and supplemental oxygen left off, nursing notified. at entry BP 133/74 HR 88, with sitting pt c/o of dizziness BP 141/94, dizziness abated within 2 minutes        Assessment/Plan    PT Assessment Patient needs continued PT services  PT Problem List Decreased strength;Decreased range of motion;Decreased activity tolerance;Decreased balance;Decreased mobility;Cardiopulmonary status limiting activity;Impaired sensation;Pain       PT Treatment Interventions DME instruction;Gait training;Stair training;Functional mobility training;Therapeutic activities;Therapeutic exercise;Patient/family education    PT Goals (Current goals can be  found in the Care Plan section)  Acute Rehab PT Goals Patient Stated Goal: feel better PT Goal Formulation: With patient Time For Goal Achievement: 07/12/17 Potential to Achieve Goals: Good    Frequency Min 3X/week   Barriers to discharge Inaccessible home environment      Co-evaluation PT/OT/SLP Co-Evaluation/Treatment: Yes Reason for Co-Treatment: Complexity of the patient's impairments (multi-system involvement);For patient/therapist safety PT goals addressed during session: Mobility/safety with mobility         AM-PAC PT "6 Clicks" Daily Activity  Outcome Measure Difficulty turning over in bed (including adjusting bedclothes, sheets and blankets)?: Unable Difficulty moving from lying on back to sitting on the side of the bed? : Unable Difficulty sitting down on and standing up from a chair with arms (e.g., wheelchair, bedside commode, etc,.)?: Unable Help needed moving to and from a bed to chair (including a wheelchair)?: A Lot Help needed walking in hospital room?: Total Help needed climbing 3-5 steps with a railing? : Total 6 Click Score: 7    End of Session Equipment Utilized During Treatment: Gait belt Activity Tolerance: Patient limited by pain Patient left: in chair;with call bell/phone within reach;with chair alarm set Nurse Communication: Mobility status PT Visit Diagnosis: Unsteadiness  on feet (R26.81);Other abnormalities of gait and mobility (R26.89);Muscle weakness (generalized) (M62.81);Difficulty in walking, not elsewhere classified (R26.2);Hemiplegia and hemiparesis;Pain Hemiplegia - Right/Left: Left Hemiplegia - dominant/non-dominant: Non-dominant Hemiplegia - caused by: Cerebral infarction Pain - Right/Left: Left Pain - part of body: Ankle and joints of foot;Leg    Time: 1314-3888 PT Time Calculation (min) (ACUTE ONLY): 43 min   Charges:   PT Evaluation $PT Eval Moderate Complexity: 1 Mod PT Treatments $Therapeutic Activity: 8-22 mins   PT G  Codes:        Lively Haberman B. Migdalia Dk PT, DPT Acute Rehabilitation  [phone] Pager [phone]    [employer] 06/28/2017, 12:26 PM

## 2017-06-28 NOTE — Progress Notes (Signed)
Patient unable to void since foley discontinued. Bladder scan performed, 452 mL displayed. Dr. Sarajane Jews paged and notified.

## 2017-06-28 NOTE — Consult Note (Signed)
[facility] Surgery Consult Note  Anna Hayes 1951/09/20  [id number].    Requesting MD: Murray Hodgkins Chief Complaint/Reason for Consult: SBO  HPI:  Anna Hayes is a 66yo female s/p exploratory laparotomy with lysis of adhesions and small bowel perforation repair for SBO at [facility] by Dr. Noberto Retort on 06/19/17. Patient was transferred to [facility] 10/2 due to an ischemic left limb. Patient underwent emergent vascular surgery yesterday requiring thrombectomy of multiple bilateral vessels and left leg fasciotomy by Dr. Trula Slade. General surgery consulted for postoperative management from abdominal surgery. Patient did have a complicated course following her abdominal surgery. On 9/27 she started developing some fevers and worsening leukocytosis. CT scan of the abd/pelvis showed a developing absess which was drained by IR on 10/1; per report only "thin fluid" was found that did not appear infectious.  She was started on levaquin and flagyl on 9/27.  Blood cultures from 9/27 also growing out fungus therefore casopofugin was started on 10/1. Patient states that she continues to have some nausea, no recent emesis. She is passing flatus and having 1-2 loose BMs daily. She reports increased abdominal distension and less flatus today. She is taking in few clear liquids. States that they were packing her wound every other day at [facility] [facility].  PMH significant for PAD, HTN, H/o stroke with residual left sided weakness Smokes about 10 cigarettes daily Lives at home with daughter  ROS: Review of Systems  HENT: Negative.   Eyes: Negative.   Respiratory: Negative.   Cardiovascular: Positive for leg swelling.  Gastrointestinal: Positive for abdominal pain and nausea. Negative for vomiting.  Genitourinary: Negative.   Musculoskeletal: Negative.   Skin: Negative.   Neurological: Positive for weakness.  All systems reviewed and otherwise negative except for as above  No  family history on file.  Past Medical History:  Diagnosis Date  . Frequent headaches   . Muscle pain   . Palpitations   . Sinus problem   . Stroke [facility])     No past surgical history on file.  Social History:  reports that she has been smoking Cigarettes.  She has been smoking about 0.50 packs per day. She has never used smokeless tobacco. She reports that she does not drink alcohol or use drugs.  Allergies:  Allergies  Allergen Reactions  . Codeine   . Latex     Blisters  . Penicillins     Medications Prior to Admission  Medication Sig Dispense Refill  . amLODipine (NORVASC) 5 MG tablet     . atenolol (TENORMIN) 50 MG tablet     . betamethasone dipropionate (DIPROLENE) 0.05 % cream     . butalbital-acetaminophen-caffeine (FIORICET, ESGIC) 50-325-40 MG per tablet     . dicyclomine (BENTYL) 10 MG capsule     . furosemide (LASIX) 20 MG tablet     . gabapentin (NEURONTIN) 300 MG capsule     . HYDROcodone-acetaminophen (NORCO/VICODIN) 5-325 MG per tablet Take 1 tablet by mouth every 4 (four) hours as needed. 40 tablet 0  . levothyroxine (SYNTHROID, LEVOTHROID) 25 MCG tablet     . lisinopril (PRINIVIL,ZESTRIL) 10 MG tablet     . metoCLOPramide (REGLAN) 5 MG tablet     . omeprazole (PRILOSEC) 40 MG capsule     . promethazine (PHENERGAN) 25 MG tablet     . simvastatin (ZOCOR) 40 MG tablet       Prior to Admission medications   Medication Sig Start Date End Date Taking? Authorizing Provider  amLODipine (NORVASC) 5 MG tablet  09/11/13   [provider]  atenolol (TENORMIN) 50 MG tablet  08/24/13   [provider]  betamethasone dipropionate (DIPROLENE) 0.05 % cream  04/27/14   [provider]  butalbital-acetaminophen-caffeine (FIORICET, [place]) [id number] MG per tablet  09/27/13   [provider]  dicyclomine (BENTYL) 10 MG capsule  07/26/13   [provider]  furosemide (LASIX) 20 MG tablet  12/17/13   [provider]    gabapentin (NEURONTIN) 300 MG capsule  08/26/13   [provider]  HYDROcodone-acetaminophen (NORCO/VICODIN) 5-325 MG per tablet Take 1 tablet by mouth every 4 (four) hours as needed. 01/28/14   Bronson Ing, DPM  levothyroxine (SYNTHROID, LEVOTHROID) 25 MCG tablet  09/25/13   [provider]  lisinopril (PRINIVIL,ZESTRIL) 10 MG tablet  07/22/13   [provider]  metoCLOPramide (REGLAN) 5 MG tablet  08/26/13   [provider]  omeprazole (PRILOSEC) 40 MG capsule  07/26/13   [provider]  promethazine (PHENERGAN) 25 MG tablet  09/28/13   [provider]  simvastatin (ZOCOR) 40 MG tablet  07/04/13   [provider]    Blood pressure 133/74, pulse (!) 104, temperature 98.5 F (36.9 C), temperature source Oral, resp. rate 14, height 5' 2" (1.575 m), weight 175 lb 8 oz (79.6 kg), SpO2 100 %. Physical Exam: General: pleasant, WD/WN AA female who is sitting up in chair in NAD HEENT: head is normocephalic, atraumatic.  Sclera are noninjected.  Pupils equal and round.  Ears and nose without any masses or lesions.  Mouth is pink and moist. Dentition fair Heart: regular, rate, and rhythm.  No obvious murmurs, gallops, or rubs noted.  Palpable DP pulse RLE. Bilateral feet WWP Lungs: CTAB, no wheezes, rhonchi, or rales noted.  Respiratory effort nonlabored MS: weakness noted to LUE. Mild edema BUE and LLE. Wound vac to left lower leg.  Skin: warm and dry with no masses, lesions, or rashes Psych: A&Ox3 with an appropriate affect. Neuro: cranial nerves grossly intact, extremity CSM intact bilaterally, normal speech Abd: soft, distended, mild global tenderness, few BS heard, midline incision partial open with staples intact and purulent drainage . Removed staples and patient appears to have a ventral hernia, more purulent drainage noted. Transgluteal drain with serous fluid.     Results for orders placed or performed during the hospital  encounter of 06/27/17 (from the past 48 hour(s))  Type and screen [place]     Status: None   Collection Time: 06/27/17  1:00 PM  Result Value Ref Range   ABO/RH(D) A POS    Antibody Screen NEG    Sample Expiration 06/30/2017    Unit Number [id number]    Blood Component Type RED CELLS,LR    Unit division 00    Status of Unit ISSUED,FINAL    Transfusion Status OK TO TRANSFUSE    Crossmatch Result Compatible    Unit Number [id number]    Blood Component Type RED CELLS,LR    Unit division 00    Status of Unit ISSUED,FINAL    Transfusion Status OK TO TRANSFUSE    Crossmatch Result Compatible   ABO/Rh     Status: None   Collection Time: 06/27/17  1:00 PM  Result Value Ref Range   ABO/RH(D) A POS   Prepare RBC     Status: None   Collection Time: 06/27/17  3:01 PM  Result Value Ref Range   Order Confirmation ORDER PROCESSED  BY BLOOD BANK   I-STAT 7, (LYTES, BLD GAS, ICA, H+H)     Status: Abnormal   Collection Time: 06/27/17  3:01 PM  Result Value Ref Range   pH, Arterial 7.343 (L) 7.350 - 7.450   pCO2 arterial 37.2 32.0 - 48.0 mmHg   pO2, Arterial 170.0 (H) 83.0 - 108.0 mmHg   Bicarbonate 20.3 20.0 - 28.0 mmol/L   TCO2 21 (L) 22 - 32 mmol/L   O2 Saturation 99.0 %   Acid-base deficit 5.0 (H) 0.0 - 2.0 mmol/L   Sodium 138 135 - 145 mmol/L   Potassium 4.4 3.5 - 5.1 mmol/L   Calcium, Ion 1.05 (L) 1.15 - 1.40 mmol/L   HCT 23.0 (L) 36.0 - 46.0 %   Hemoglobin 7.8 (L) 12.0 - 15.0 g/dL   Patient temperature 36.9 C    Sample type ARTERIAL   Comprehensive metabolic panel     Status: Abnormal   Collection Time: 06/27/17  3:50 PM  Result Value Ref Range   Sodium 133 (L) 135 - 145 mmol/L   Potassium 4.3 3.5 - 5.1 mmol/L   Chloride 111 101 - 111 mmol/L   CO2 19 (L) 22 - 32 mmol/L   Glucose, Bld 92 65 - 99 mg/dL   BUN <5 (L) 6 - 20 mg/dL   Creatinine, Ser 0.71 0.44 - 1.00 mg/dL   Calcium 6.8 (L) 8.9 - 10.3 mg/dL   Total Protein 4.3 (L) 6.5 - 8.1 g/dL    Albumin 1.4 (L) 3.5 - 5.0 g/dL   AST 44 (H) 15 - 41 U/L   ALT 22 14 - 54 U/L   Alkaline Phosphatase 47 38 - 126 U/L   Total Bilirubin 0.4 0.3 - 1.2 mg/dL   GFR calc non Af Amer >60 >60 mL/min   GFR calc Af Amer >60 >60 mL/min    Comment: (NOTE) The eGFR has been calculated using the CKD EPI equation. This calculation has not been validated in all clinical situations. eGFR's persistently <60 mL/min signify possible Chronic Kidney Disease.    Anion gap 3 (L) 5 - 15  I-STAT 7, (LYTES, BLD GAS, ICA, H+H)     Status: Abnormal   Collection Time: 06/27/17  4:16 PM  Result Value Ref Range   pH, Arterial 7.289 (L) 7.350 - 7.450   pCO2 arterial 41.7 32.0 - 48.0 mmHg   pO2, Arterial 156.0 (H) 83.0 - 108.0 mmHg   Bicarbonate 20.0 20.0 - 28.0 mmol/L   TCO2 21 (L) 22 - 32 mmol/L   O2 Saturation 99.0 %   Acid-base deficit 6.0 (H) 0.0 - 2.0 mmol/L   Sodium 138 135 - 145 mmol/L   Potassium 4.9 3.5 - 5.1 mmol/L   Calcium, Ion 1.00 (L) 1.15 - 1.40 mmol/L   HCT 32.0 (L) 36.0 - 46.0 %   Hemoglobin 10.9 (L) 12.0 - 15.0 g/dL   Patient temperature 36.9 C    Sample type ARTERIAL   CBC     Status: Abnormal   Collection Time: 06/27/17  6:03 PM  Result Value Ref Range   WBC 27.6 (H) 4.0 - 10.5 K/uL   RBC 4.14 3.87 - 5.11 MIL/uL   Hemoglobin 10.4 (L) 12.0 - 15.0 g/dL    Comment: POST TRANSFUSION SPECIMEN   HCT 30.9 (L) 36.0 - 46.0 %   MCV 74.6 (L) 78.0 - 100.0 fL   MCH 25.1 (L) 26.0 - 34.0 pg   MCHC 33.7 30.0 - 36.0 g/dL   RDW 16.1 (H) 11.5 -  15.5 %   Platelets 196 150 - 400 K/uL  APTT     Status: Abnormal   Collection Time: 06/27/17  6:03 PM  Result Value Ref Range   aPTT 155 (H) 24 - 36 seconds    Comment:        IF BASELINE aPTT IS ELEVATED, SUGGEST PATIENT RISK ASSESSMENT BE USED TO DETERMINE APPROPRIATE ANTICOAGULANT THERAPY.   CBC     Status: Abnormal   Collection Time: 06/28/17  4:23 AM  Result Value Ref Range   WBC 27.2 (H) 4.0 - 10.5 K/uL   RBC 3.75 (L) 3.87 - 5.11 MIL/uL    Hemoglobin 9.5 (L) 12.0 - 15.0 g/dL   HCT 27.6 (L) 36.0 - 46.0 %   MCV 73.6 (L) 78.0 - 100.0 fL   MCH 25.3 (L) 26.0 - 34.0 pg   MCHC 34.4 30.0 - 36.0 g/dL   RDW 16.5 (H) 11.5 - 15.5 %   Platelets 192 150 - 400 K/uL  Basic metabolic panel     Status: Abnormal   Collection Time: 06/28/17  4:23 AM  Result Value Ref Range   Sodium 135 135 - 145 mmol/L   Potassium 4.7 3.5 - 5.1 mmol/L   Chloride 108 101 - 111 mmol/L   CO2 20 (L) 22 - 32 mmol/L   Glucose, Bld 107 (H) 65 - 99 mg/dL   BUN 5 (L) 6 - 20 mg/dL   Creatinine, Ser 0.74 0.44 - 1.00 mg/dL   Calcium 6.7 (L) 8.9 - 10.3 mg/dL   GFR calc non Af Amer >60 >60 mL/min   GFR calc Af Amer >60 >60 mL/min    Comment: (NOTE) The eGFR has been calculated using the CKD EPI equation. This calculation has not been validated in all clinical situations. eGFR's persistently <60 mL/min signify possible Chronic Kidney Disease.    Anion gap 7 5 - 15  APTT     Status: Abnormal   Collection Time: 06/28/17  7:52 AM  Result Value Ref Range   aPTT 70 (H) 24 - 36 seconds    Comment:        IF BASELINE aPTT IS ELEVATED, SUGGEST PATIENT RISK ASSESSMENT BE USED TO DETERMINE APPROPRIATE ANTICOAGULANT THERAPY.    No results found.  Anti-infectives    Start     Dose/Rate Route Frequency Ordered Stop   06/28/17 2200  anidulafungin (ERAXIS) 100 mg in sodium chloride 0.9 % 100 mL IVPB     100 mg 78 mL/hr over 100 Minutes Intravenous Daily at bedtime 06/27/17 2324     06/28/17 0600  levofloxacin (LEVAQUIN) IVPB 500 mg     500 mg 100 mL/hr over 60 Minutes Intravenous Every 24 hours 06/28/17 0006     06/28/17 0100  anidulafungin (ERAXIS) 200 mg in sodium chloride 0.9 % 200 mL IVPB     200 mg 78 mL/hr over 200 Minutes Intravenous  Once 06/28/17 0015 06/28/17 0549   06/27/17 2359  metroNIDAZOLE (FLAGYL) IVPB 500 mg     500 mg 100 mL/hr over 60 Minutes Intravenous Every 8 hours 06/27/17 2324     06/27/17 2359  levofloxacin (LEVAQUIN) IVPB 500 mg  Status:   Discontinued     500 mg 100 mL/hr over 60 Minutes Intravenous Daily at bedtime 06/27/17 2324 06/28/17 0006   06/27/17 2030  vancomycin (VANCOCIN) IVPB 1000 mg/200 mL premix     1,000 mg 200 mL/hr over 60 Minutes Intravenous Every 12 hours 06/27/17 1741 06/28/17 1110   06/27/17 1345  cefUROXime (ZINACEF) 1.5 g in dextrose 5 % 50 mL IVPB     1.5 g 100 mL/hr over 30 Minutes Intravenous To Surgery 06/27/17 1340 06/27/17 1341       Assessment/Plan Lower limb ischemia - s/p thrombectomy of multiple bilateral vessels and left leg fasciotomy 06/27/17 Dr. Trula Slade PAD HTN H/o stroke with residual left sided weakness AKI  SBO - s/p exploratory laparotomy with lysis of adhesions and small bowel perforation repair for SBO at [facility] by Dr. Noberto Retort on 06/19/17 - s/p IR placement of transgluteal drain 10/1 for pelvic fluid collection seen on CT scan with only "thin fluid" returned - tolerating clears but having some nausea now  ID - eraxis, flagyl, levaquin VTE - heparin FEN - IVF, clear liquids Foley - none Follow up - Dr. Noberto Retort  Plan - Staples removed and wound packed with wet to dry dressing, will need to be done BID. Will obtain an abdominal xray to eval nausea and abdominal distension. Continue clear liquids as tolerated for now. Encourage mobility/OOB. IR has been consulted for assistance with management of transgluteal drain. ID consulted as well.  Wellington [place], [facility] Surgery 06/28/2017, 10:56 AM Pager: [phone] Consults: [phone] Mon-Fri 7:00 am-4:30 pm Sat-Sun 7:00 am-11:30 am]

## 2017-06-28 NOTE — Evaluation (Signed)
Occupational Therapy Evaluation Patient Details Name: Anna Hayes MRN: [id number] DOB: 01/21/1951 Today's Date: 06/28/2017    History of Present Illness Pt is a 66 y.o. female with medical history significant of CVA with left arm weakness present to [facility] with a sbo after experincing symptoms for ten days.she went to OR on 9/24 had lysis of adhesions and repair of perforated bowel.  Post op she developed AKI with peak cr of 2.1 this stabalized with ivf.  On 9/27 she started developing some fevers and worsening leukocytosis.  ble u/s was done which was neg for DVT.  Ct scan of abd/pelvis showed a developing absess which was drained by IR on 10/1.  She was started on levaquin /flagyl on 9/27.  Blood cultures from 9/27 also growing out fungus therefore casopofugin was started on 10/1 after their doctor consulted with [facility].  On the evening around 9/30 she then developed an ischemic left limb.  A stat cta with run off was performed with showed bilateral occlusion to both legs.  Pt was then arranged to be transferred here to get emergent vascular surgery which she has gotten s/p thrombectomy of bilateral vessels(see vascular report) and left leg fasciotomy.    Clinical Impression   PTA, pt was independent with cane for ADL and functional mobility. Pt very active post-acute D/C and participates in exercise classes with [employer]ne[employer]. Pt currently requires max assist for LB ADL and mod assist for lateral scoot simulated toilet transfers. Pt presents with pain in B LE and decreased sensation in L LE along with decreased balance and activity tolerance for ADL impacting her ability to participate at [facility]. Feel pt would best benefit from CIR level therapies post-acute D/C to maximize return to independent PLOF. She has excellent family support and is highly motivated to improve her functional status. OT will continue to follow acutely.    Follow Up Recommendations  CIR;Supervision/Assistance - 24  hour    Equipment Recommendations  Other (comment) (TBD at next venue of care)    Recommendations for Other Services Rehab consult     Precautions / Restrictions Precautions Precautions: None Restrictions Weight Bearing Restrictions: No      Mobility Bed Mobility Overal bed mobility: Needs Assistance Bed Mobility: Supine to Sit;Rolling Rolling: Min assist   Supine to sit: Mod assist;+2 for physical assistance     General bed mobility comments: min A for rolling to L x2 for bed pan placement and removal, modA for trunk to upright, LE management off bed and pad scoot of hips to EoB  Transfers Overall transfer level: Needs assistance Equipment used: 1 person hand held assist;2 person hand held assist Transfers: Lateral/Scoot Transfers;Squat Pivot Transfers     Squat pivot transfers: Total assist    Lateral/Scoot Transfers: +2 physical assistance;Min assist;Mod assist General transfer comment: pt initially attempted squat pivot transfer but unable to get her weight over her R LE and R floot slid forward on floor, on 2nd attempt PT tried to block R knee but too painful, dropped arm of recliner and bed and pt was able to perform lateral scoot to recliner initially with muliple scoots of hips initially modAx2, but by last scoot only required minA of one to block R foot    Balance Overall balance assessment: Needs assistance Sitting-balance support: Feet supported;No upper extremity supported Sitting balance-Leahy Scale: Fair  ADL either performed or assessed with clinical judgement   ADL Overall ADL's : Needs assistance/impaired Eating/Feeding: Set up;Sitting   Grooming: Set up;Sitting   Upper Body Bathing: Min guard;Sitting   Lower Body Bathing: Maximal assistance;Sitting/lateral leans   Upper Body Dressing : Min guard;Sitting   Lower Body Dressing: Maximal assistance;Sitting/lateral leans   Toilet Transfer: +2 for  physical assistance;Moderate assistance (lateral scoot; attempted squat pivot) Toilet Transfer Details (indicate cue type and reason): Unable to complete squat-pivot transfer.  Toileting- Clothing Manipulation and Hygiene: Maximal assistance;Sitting/lateral lean         General ADL Comments: Pt very motivated to participate and demonstrates an excellent outlook.      Vision   Additional Comments: Need to further assess      Perception     Praxis      Pertinent Vitals/Pain Pain Assessment: Faces Faces Pain Scale: Hurts even more Pain Location: L LE Pain Descriptors / Indicators: Grimacing;Guarding Pain Intervention(s): Monitored during session;Limited activity within patient's tolerance;RN gave pain meds during session     Hand Dominance Right   Extremity/Trunk Assessment Upper Extremity Assessment Upper Extremity Assessment: LUE deficits/detail LUE Deficits / Details: History of CVA with R hand contracture and increased tone.    Lower Extremity Assessment Lower Extremity Assessment: Defer to PT evaluation RLE Deficits / Details: R hip and knee ROM limited by groin incision, strength grossly 3/5 RLE: Unable to fully assess due to pain LLE Deficits / Details: L LE ROM limited secondary to surgical pain and previous CVA, d LLE: Unable to fully assess due to pain LLE Sensation: decreased light touch (decreased below knee) LLE Coordination: decreased fine motor;decreased gross motor       Communication Communication Communication: No difficulties   Cognition Arousal/Alertness: Awake/alert Behavior During Therapy: WFL for tasks assessed/performed Overall Cognitive Status: Within Functional Limits for tasks assessed                                     General Comments  see PT note for vitals    Exercises     Shoulder Instructions      Home Living Family/patient expects to be discharged to:: Private residence Living Arrangements: Children;Other  relatives Available Help at Discharge: Family;Available 24 hours/day Type of Home: House Home Access: Stairs to enter [employer] of Steps: 5 Entrance Stairs-Rails: None Home Layout: One level     Bathroom Shower/Tub: Teacher, early years/pre: Standard Bathroom Accessibility: Yes   Home Equipment: Cane - quad;Shower seat          Prior Functioning/Environment Level of Independence: Independent with assistive device(s)        Comments: uses quad cane for ambulation, attends Silver sneakers, independent with bathing and grooming         OT Problem List: Decreased strength;Decreased activity tolerance;Impaired balance (sitting and/or standing);Decreased safety awareness;Decreased knowledge of use of DME or AE;Decreased knowledge of precautions;Decreased range of motion;Impaired UE functional use      OT Treatment/Interventions: Self-care/ADL training;Therapeutic exercise;Energy conservation;DME and/or AE instruction;Therapeutic activities;Patient/family education;Balance training    OT Goals(Current goals can be found in the care plan section) Acute Rehab OT Goals Patient Stated Goal: feel better OT Goal Formulation: With patient Time For Goal Achievement: 07/12/17 Potential to Achieve Goals: Good ADL Goals Pt Will Perform Grooming: with set-up;sitting Pt Will Perform Lower Body Dressing: with min assist;sitting/lateral leans Pt Will Transfer to Toilet: squat pivot  transfer;with min assist;bedside commode Pt Will Perform Toileting - Clothing Manipulation and hygiene: with min guard assist;sitting/lateral leans  OT Frequency: Min 2X/week   Barriers to D/C:            Co-evaluation PT/OT/SLP Co-Evaluation/Treatment: Yes Reason for Co-Treatment: Complexity of the patient's impairments (multi-system involvement);For patient/therapist safety PT goals addressed during session: Mobility/safety with mobility OT goals addressed during session: ADL's and  self-care      AM-PAC PT "6 Clicks" Daily Activity     Outcome Measure Help from another person eating meals?: A Little Help from another person taking care of personal grooming?: A Little Help from another person toileting, which includes using toliet, bedpan, or urinal?: A Lot Help from another person bathing (including washing, rinsing, drying)?: A Lot Help from another person to put on and taking off regular upper body clothing?: A Little Help from another person to put on and taking off regular lower body clothing?: A Lot 6 Click Score: 15   End of Session Equipment Utilized During Treatment: Gait belt Nurse Communication: Mobility status  Activity Tolerance: Patient tolerated treatment well Patient left: in chair;with call bell/phone within reach;with chair alarm set  OT Visit Diagnosis: Other abnormalities of gait and mobility (R26.89);Muscle weakness (generalized) (M62.81)                Time: 1610-9604 OT Time Calculation (min): 42 min Charges:  OT General Charges $OT Visit: 1 Visit OT Evaluation $OT Eval Moderate Complexity: 1 Mod G-Codes:     Norman Herrlich, MS OTR/L  Pager: [place] A Latonya Knight 06/28/2017, 1:04 PM

## 2017-06-28 NOTE — Progress Notes (Signed)
Patient ID: Anna Hayes, female   DOB: 1951-09-15, 66 y.o.   MRN: [id number]    Referring Physician(s): Dr. Rolm Bookbinder  Supervising Physician: Aletta Edouard  Patient Status: [facility] - In-pt  Chief Complaint: Pelvic fluid collection  Subjective: Patient denies abdominal pain right now.    Allergies: Codeine; Latex; and Penicillins  Medications: Prior to Admission medications   Medication Sig Start Date End Date Taking? Authorizing Provider  amLODipine (NORVASC) 5 MG tablet Take 5 mg by mouth daily.  09/11/13  Yes [provider]  atenolol (TENORMIN) 50 MG tablet Take 25 mg by mouth daily.  08/24/13  Yes [provider]  betamethasone dipropionate (DIPROLENE) 0.05 % cream  04/27/14  Yes [provider]  butalbital-acetaminophen-caffeine (FIORICET, ESGIC) 50-325-40 MG per tablet Take 2 tablets by mouth every 6 (six) hours as needed for headache or migraine.  09/27/13  Yes [provider]  dicyclomine (BENTYL) 10 MG capsule Take 10 mg by mouth 3 (three) times daily before meals.  07/26/13  Yes [provider]  furosemide (LASIX) 20 MG tablet Take 20 mg by mouth daily.  12/17/13  Yes [provider]  gabapentin (NEURONTIN) 300 MG capsule Take 300 mg by mouth 3 (three) times daily.  08/26/13  Yes [provider]  levothyroxine (SYNTHROID, LEVOTHROID) 25 MCG tablet Take 25 mcg by mouth daily.  09/25/13  Yes [provider]  lisinopril (PRINIVIL,ZESTRIL) 10 MG tablet Take 10 mg by mouth daily.  07/22/13  Yes [provider]  omeprazole (PRILOSEC) 40 MG capsule Take 40 mg by mouth daily.  07/26/13  Yes [provider]  simvastatin (ZOCOR) 40 MG tablet Take 40 mg by mouth daily.  07/04/13  Yes [provider]    Vital Signs: BP 116/69 (BP Location: Left Arm)   Pulse 97   Temp (!) 97.5 F (36.4 C) (Oral)   Resp 13   Ht 5\' 2"  (1.575 m)   Wt 175 lb 8 oz (79.6 kg)   SpO2 100%   BMI  32.10 kg/m   Physical Exam: Abd: TG drain in place with yellow serous drainage.  About 25cc in bag currently, but just transferred here over night so no documented 24 hrs output.  Drain site c/d/i  Imaging: Dg Abd Portable 1v  Result Date: 06/28/2017 CLINICAL DATA:  Abdominal pain and nausea. EXAM: PORTABLE ABDOMEN - 1 VIEW COMPARISON:  None. FINDINGS: The bowel gas pattern is normal. No radio-opaque calculi or other significant radiographic abnormality are seen. Pigtail drainage catheter seen in the pelvis. Enteric contrast from prior CT. Degenerative change both hips. IMPRESSION: No obstruction or visible free air. Electronically Signed   By: Staci Righter M.D.   On: 06/28/2017 13:47    Labs:  CBC:  Recent Labs  06/27/17 1501 06/27/17 1616 06/27/17 1803 06/28/17 0423  WBC  --   --  27.6* 27.2*  HGB 7.8* 10.9* 10.4* 9.5*  HCT 23.0* 32.0* 30.9* 27.6*  PLT  --   --  196 192    COAGS:  Recent Labs  06/27/17 1803 06/28/17 0752  APTT 155* 70*    BMP:  Recent Labs  06/27/17 1501 06/27/17 1550 06/27/17 1616 06/28/17 0423  NA 138 133* 138 135  K 4.4 4.3 4.9 4.7  CL  --  111  --  108  CO2  --  19*  --  20*  GLUCOSE  --  92  --  107*  BUN  --  <5*  --  5*  CALCIUM  --  6.8*  --  6.7*  CREATININE  --  0.71  --  0.74  GFRNONAA  --  >60  --  >60  GFRAA  --  >60  --  >60    LIVER FUNCTION TESTS:  Recent Labs  06/27/17 1550  BILITOT 0.4  AST 44*  ALT 22  ALKPHOS 47  PROT 4.3*  ALBUMIN 1.4*    Assessment and Plan: 1. Pelvic fluid collection, s/p perc drain at [facility] by Dr. Earleen Newport on 10/1  -drain is draining well.  Will make sure flush order is in place. -cont with drain for now as this was just placed 2 days ago.  Plan for repeat CT when output less than 10-15cc/day. -will follow  Electronically Signed: Briellah Baik E 06/28/2017, 1:55 PM   I spent a total of 15 Minutes at the the patient's bedside AND on the patient's hospital floor or  unit, greater than 50% of which was counseling/coordinating care for pelvic abscess

## 2017-06-28 NOTE — Progress Notes (Signed)
  Progress Note    06/28/2017 8:52 AM 1 Day Post-Op  Subjective:  Feels better today  Afebrile HR 80's-100's NSR 094'M-768'G systolic  Vitals:   88/11/03 0800 06/28/17 0815  BP: 125/79 133/74  Pulse: 100 (!) 104  Resp: 15 14  Temp:  98.5 F (36.9 C)  SpO2: 100% 100%    Physical Exam: Cardiac:  regular Lungs:  Non labored Incisions:  Wound vacs with good seal; compartments on the right and left legs are soft and non tender.   Extremities:  Brisk doppler signal right DP; right foot is warm with motor and sensation in tact. Monophasic doppler signal left PT; there is no sensation from the left foot to the knee.  Motor is not in tact left foot.  Color changes as noted below.       Abdomen:  With bandage in place  CBC    Component Value Date/Time   WBC 27.2 (H) 06/28/2017 0423   RBC 3.75 (L) 06/28/2017 0423   HGB 9.5 (L) 06/28/2017 0423   HCT 27.6 (L) 06/28/2017 0423   PLT 192 06/28/2017 0423   MCV 73.6 (L) 06/28/2017 0423   MCH 25.3 (L) 06/28/2017 0423   MCHC 34.4 06/28/2017 0423   RDW 16.5 (H) 06/28/2017 0423    BMET    Component Value Date/Time   NA 135 06/28/2017 0423   K 4.7 06/28/2017 0423   CL 108 06/28/2017 0423   CO2 20 (L) 06/28/2017 0423   GLUCOSE 107 (H) 06/28/2017 0423   BUN 5 (L) 06/28/2017 0423   CREATININE 0.74 06/28/2017 0423   CALCIUM 6.7 (L) 06/28/2017 0423   GFRNONAA >60 06/28/2017 0423   GFRAA >60 06/28/2017 0423    INR No results found for: INR   Intake/Output Summary (Last 24 hours) at 06/28/17 0852 Last data filed at 06/28/17 0630  Gross per 24 hour  Intake           3611.1 ml  Output             3125 ml  Net            486.1 ml     Assessment:  66 y.o. female is s/p:  #1:  Bilateral common femoral artery exposure #2:  Bilateral popliteal artery exposure #3:  Left Posterior tibial artery exposure #4:  Thrombectomy of right CFA, PFA, CFA, popliteal, anterior and posterior tibial vessels #5:  Thrombectomy of left CFA,  PFA, SFA, popliteal, anter tibial, and posterior tibial arteries #6:  Vein patch angioplasty of left posterior tibial artery #7:  4 compartment left leg fasciotomy #8:  Wound vac placement  1 Day Post-Op  Plan: -pt with brisk doppler signal right DP; left foot is cool to touch and darkened.  There is +left PT doppler signal.  There is no motor or sensation in tact of left foot to the knee level.  Sensory and motor are in tact right foot.  Calves are soft and non tender bilaterally.  Will d/w Dr. Trula Slade -DVT prophylaxis:  Heparin gtt    Leontine Locket, PA-C [facility] [phone] 06/28/2017 8:52 AM  Agree with the above.  I have seen and examined the patient.  SHe has a brisk doppler PT and AT signal.  The foot remains ischemic.  He is at high risk for amputation.  Will ask pharmacy to adjust heparin drip.    Annamarie Major

## 2017-06-29 ENCOUNTER — Encounter (HOSPITAL_COMMUNITY): Payer: Self-pay

## 2017-06-29 ENCOUNTER — Encounter (HOSPITAL_COMMUNITY): Payer: Medicare Other

## 2017-06-29 LAB — HEPATIC FUNCTION PANEL
ALT: 20 U/L (ref 14–54)
AST: 42 U/L — ABNORMAL HIGH (ref 15–41)
Albumin: 1.7 g/dL — ABNORMAL LOW (ref 3.5–5.0)
Alkaline Phosphatase: 51 U/L (ref 38–126)
Bilirubin, Direct: 0.2 mg/dL (ref 0.1–0.5)
Indirect Bilirubin: 0.5 mg/dL (ref 0.3–0.9)
Total Bilirubin: 0.7 mg/dL (ref 0.3–1.2)
Total Protein: 4.8 g/dL — ABNORMAL LOW (ref 6.5–8.1)

## 2017-06-29 LAB — BLOOD CULTURE ID PANEL (REFLEXED)
Acinetobacter baumannii: NOT DETECTED
CANDIDA GLABRATA: NOT DETECTED
CANDIDA TROPICALIS: NOT DETECTED
Candida albicans: NOT DETECTED
Candida krusei: NOT DETECTED
Candida parapsilosis: NOT DETECTED
ENTEROBACTER CLOACAE COMPLEX: NOT DETECTED
Enterobacteriaceae species: NOT DETECTED
Enterococcus species: NOT DETECTED
Escherichia coli: NOT DETECTED
Haemophilus influenzae: NOT DETECTED
KLEBSIELLA PNEUMONIAE: NOT DETECTED
Klebsiella oxytoca: NOT DETECTED
Listeria monocytogenes: NOT DETECTED
Methicillin resistance: DETECTED — AB
Neisseria meningitidis: NOT DETECTED
PROTEUS SPECIES: NOT DETECTED
Pseudomonas aeruginosa: NOT DETECTED
SERRATIA MARCESCENS: NOT DETECTED
STAPHYLOCOCCUS SPECIES: DETECTED — AB
STREPTOCOCCUS SPECIES: NOT DETECTED
Staphylococcus aureus (BCID): NOT DETECTED
Streptococcus agalactiae: NOT DETECTED
Streptococcus pneumoniae: NOT DETECTED
Streptococcus pyogenes: NOT DETECTED

## 2017-06-29 LAB — APTT: APTT: 49 s — AB (ref 24–36)

## 2017-06-29 LAB — CBC
HEMATOCRIT: 25.9 % — AB (ref 36.0–46.0)
HEMOGLOBIN: 8.8 g/dL — AB (ref 12.0–15.0)
MCH: 26 pg (ref 26.0–34.0)
MCHC: 34 g/dL (ref 30.0–36.0)
MCV: 76.4 fL — ABNORMAL LOW (ref 78.0–100.0)
Platelets: 152 10*3/uL (ref 150–400)
RBC: 3.39 MIL/uL — ABNORMAL LOW (ref 3.87–5.11)
RDW: 18.4 % — AB (ref 11.5–15.5)
WBC: 22.4 10*3/uL — ABNORMAL HIGH (ref 4.0–10.5)

## 2017-06-29 LAB — BASIC METABOLIC PANEL
Anion gap: 9 (ref 5–15)
BUN: 5 mg/dL — ABNORMAL LOW (ref 6–20)
CO2: 18 mmol/L — ABNORMAL LOW (ref 22–32)
Calcium: 6.7 mg/dL — ABNORMAL LOW (ref 8.9–10.3)
Chloride: 109 mmol/L (ref 101–111)
Creatinine, Ser: 0.9 mg/dL (ref 0.44–1.00)
GFR calc Af Amer: >60 mL/min (ref >60)
GFR calc non Af Amer: >60 mL/min (ref >60)
Glucose, Bld: 53 mg/dL — ABNORMAL LOW (ref 65–99)
Potassium: 4.4 mmol/L (ref 3.5–5.1)
Sodium: 136 mmol/L (ref 135–145)

## 2017-06-29 LAB — HEPARIN LEVEL (UNFRACTIONATED)
HEPARIN UNFRACTIONATED: 0.29 [IU]/mL — AB (ref 0.30–0.70)
HEPARIN UNFRACTIONATED: 0.36 [IU]/mL (ref 0.30–0.70)

## 2017-06-29 LAB — PROTIME-INR
INR: 1.14
PROTHROMBIN TIME: 14.5 s (ref 11.4–15.2)

## 2017-06-29 MED ORDER — METRONIDAZOLE 500 MG PO TABS
500.0000 mg | ORAL_TABLET | Freq: Three times a day (TID) | ORAL | Status: DC
  Administered 2017-06-29 – 2017-07-05 (×17): 500 mg via ORAL
  Filled 2017-06-29 (×17): qty 1

## 2017-06-29 MED ORDER — BOOST / RESOURCE BREEZE PO LIQD
1.0000 | Freq: Three times a day (TID) | ORAL | Status: DC
  Administered 2017-06-29 – 2017-06-30 (×3): 1 via ORAL

## 2017-06-29 MED ORDER — INFLUENZA VAC SPLIT HIGH-DOSE 0.5 ML IM SUSY
0.5000 mL | PREFILLED_SYRINGE | INTRAMUSCULAR | Status: AC
  Administered 2017-07-02: 0.5 mL via INTRAMUSCULAR
  Filled 2017-06-29: qty 0.5

## 2017-06-29 NOTE — Progress Notes (Signed)
  PHARMACY - PHYSICIAN COMMUNICATION CRITICAL VALUE ALERT - BLOOD CULTURE IDENTIFICATION (BCID)  Results for orders placed or performed during the hospital encounter of 06/27/17  Blood Culture ID Panel (Reflexed) (Collected: 06/28/2017 11:00 AM)  Result Value Ref Range   Enterococcus species NOT DETECTED NOT DETECTED   Listeria monocytogenes NOT DETECTED NOT DETECTED   Staphylococcus species DETECTED (A) NOT DETECTED   Staphylococcus aureus NOT DETECTED NOT DETECTED   Methicillin resistance DETECTED (A) NOT DETECTED   Streptococcus species NOT DETECTED NOT DETECTED   Streptococcus agalactiae NOT DETECTED NOT DETECTED   Streptococcus pneumoniae NOT DETECTED NOT DETECTED   Streptococcus pyogenes NOT DETECTED NOT DETECTED   Acinetobacter baumannii NOT DETECTED NOT DETECTED   Enterobacteriaceae species NOT DETECTED NOT DETECTED   Enterobacter cloacae complex NOT DETECTED NOT DETECTED   Escherichia coli NOT DETECTED NOT DETECTED   Klebsiella oxytoca NOT DETECTED NOT DETECTED   Klebsiella pneumoniae NOT DETECTED NOT DETECTED   Proteus species NOT DETECTED NOT DETECTED   Serratia marcescens NOT DETECTED NOT DETECTED   Haemophilus influenzae NOT DETECTED NOT DETECTED   Neisseria meningitidis NOT DETECTED NOT DETECTED   Pseudomonas aeruginosa NOT DETECTED NOT DETECTED   Candida albicans NOT DETECTED NOT DETECTED   Candida glabrata NOT DETECTED NOT DETECTED   Candida krusei NOT DETECTED NOT DETECTED   Candida parapsilosis NOT DETECTED NOT DETECTED   Candida tropicalis NOT DETECTED NOT DETECTED    Name of physician (or Provider) Contacted: DSarajane Jews  Found to have candida tropicalis fungemia at [place]. Currently on fluconazole for this. Repeat blood cx here is 1/2 with staph species. No candida detected on this BCID so blood may be clearing well.  Changes to prescribed antibiotics required: No changes necessary, continue fluconazole for fungemia  Elenor Quinones, PharmD,  BCPS Clinical Pharmacist Pager [phone] 06/29/2017 10:04 AM

## 2017-06-29 NOTE — Progress Notes (Signed)
[facility] for Infectious Disease  Date of Admission:  06/27/2017     Total days of antibiotics           8             Fluconazole 10/3 -              Ceftiraxone 10/3 -   Metronidazole 9/27 -   Levaquin - 9/27 - 10/2              Vancomycin intra op 10/2             Cefuroxime intra op 10/2             Anidulafungin                                                                                                                                                                Reason for Consult:  Fungemia, Intra-pelvic abscess s/p SBO/ex lap                       Referring Provider: Rica Mast Care Provider: Rochel Brome, MD         ASSESSMENT: Anna Hayes. Tropicalis 9/27 BCx @ [facility] 1/2  06/26/17 BCx at [place] >> NGTD (called for update today)  06/28/17 BCx >> 1/2 MRSE     Intra-abdominal/pelvic abscess s/p ex lap and percutaneous drain              Cx with GNR none predominate              RLQ percutaneous drain in place with minimal yellow thin drainage.    Ischemic Leg, s/p revascularization and fasciotomy              Doppler signals thready. No pain but decreased sensation.    PCN allergy - swelling / hives tolerant of Cephalosporins  PLAN: 1. Worried about left foot as source for what appears to be upward trend with her fever curve. She is at high risk for sepsis associated with non-viable limb; would have low threshold for amputation if she does not improve.  2. Could consider re-CT'ing abdomen/pelvis to reassess abscess and ensure drain in optimal position if fevers progress.  3. No changes to antibiotic/antifungals.  4. Would assume she needs PICC placement after adequate time with negative BCx. MRSE on 1/2 in-house cultures would presume this to be contaminant. Repeat cultures at [facility] on 10/1 were negative today on day 3.    Anna Madeira, MSN, NP-C [facility] [place] [facility] Pager: [phone]  06/29/2017  3:41 PM    Principal  Problem:   Critical lower limb ischemia Active Problems:   PAD (peripheral artery disease) (HCC)   Stroke (HCC)   SBO (small bowel obstruction) (HCC)   Intraabdominal fluid collection absess   Fungemia   Abdominal pain   History of CVA with residual deficit   Tobacco abuse   Acute blood loss anemia   Post-operative pain   Leukocytosis   . amLODipine  5 mg Oral Daily  . docusate sodium  100 mg Oral Daily  . feeding supplement  1 Container Oral TID BM  . gabapentin  300 mg Oral BID  . [START ON 06/30/2017] Influenza vac split quadrivalent PF  0.5 mL Intramuscular Tomorrow-1000  . lisinopril  10 mg Oral Daily  . pantoprazole  40 mg Oral Daily  . simvastatin  20 mg Oral q1800    SUBJECTIVE: No complaints today. Nausea is better today and tolerating diet well. Does not have any chills.   Review of Systems: Review of Systems  Constitutional: Negative for chills and fever.  HENT: Negative for sore throat.   Respiratory: Negative for cough and sputum production.   Cardiovascular: Negative for chest pain and orthopnea.  Gastrointestinal: Negative for abdominal pain, nausea and vomiting.  Genitourinary: Negative for dysuria.  Musculoskeletal: Negative for joint pain.  Skin: Negative for rash.  Neurological: Negative for headaches.    Allergies  Allergen Reactions  . Codeine   . Latex     Blisters  . Penicillins     OBJECTIVE: Vitals:   06/28/17 2354 06/29/17 0401 06/29/17 1209 06/29/17 1210  BP: (!) 118/44 122/71 124/69   Pulse: (!) 102 93 (!) 106   Resp: 16 14 16    Temp: 98.2 F (36.8 C) 99 F (37.2 C) 100.2 F (37.9 C) 100.2 F (37.9 C)  TempSrc: Oral Oral Oral   SpO2: 97% 99% 100%   Weight:      Height:       Body mass index is 32.1 kg/m.  Physical Exam  Constitutional: She is oriented to person, place, and time and well-developed, well-nourished, and in no distress.  HENT:    Mouth/Throat: Oropharynx is clear and moist.  Eyes: No scleral icterus.  Cardiovascular: Normal rate, regular rhythm and normal heart sounds.   Pulmonary/Chest: Effort normal and breath sounds normal. No respiratory distress. She has no rales.  Abdominal: Soft. Bowel sounds are normal. She exhibits distension. There is no tenderness.  Musculoskeletal: She exhibits edema.  Left lower extremity with significant edema, fluid blisters to lower ankle. Toes appear black and shriveled. No sensation. Mottled/dark skin progressing to whole left foot.   Neurological: She is alert and oriented to person, place, and time.  Skin: Skin is warm and dry.    Lab Results Lab Results  Component Value Date   WBC 22.4 (H) 06/29/2017   HGB 8.8 (L) 06/29/2017   HCT 25.9 (L) 06/29/2017   MCV 76.4 (L) 06/29/2017   PLT 152 06/29/2017    Lab Results  Component Value Date   CREATININE 0.90 06/29/2017   BUN 5 (L) 06/29/2017   NA 136 06/29/2017   K 4.4 06/29/2017   CL 109 06/29/2017   CO2 18 (L) 06/29/2017    Lab Results  Component Value Date   ALT 20 06/29/2017   AST 42 (H) 06/29/2017   ALKPHOS 51 06/29/2017   BILITOT 0.7 06/29/2017     Microbiology: Recent Results (from the past 240 hour(s))  Culture, blood (Routine X 2) w Reflex to ID Panel  Status: None (Preliminary result)   Collection Time: 06/28/17 11:00 AM  Result Value Ref Range Status   Specimen Description BLOOD LEFT ARM  Final   Special Requests IN PEDIATRIC BOTTLE Blood Culture adequate volume  Final   Culture  Setup Time   Final    GRAM POSITIVE COCCI IN PEDIATRIC BOTTLE CRITICAL RESULT CALLED TO, READ BACK BY AND VERIFIED WITH: N. Batchelder Pharm.D. 9:55 06/29/17 ([email or web address])    Culture   Final    GRAM POSITIVE COCCI CULTURE REINCUBATED FOR BETTER GROWTH    Report Status PENDING  Incomplete  Blood Culture ID Panel (Reflexed)     Status: Abnormal   Collection Time: 06/28/17 11:00 AM  Result Value Ref Range Status    Enterococcus species NOT DETECTED NOT DETECTED Final   Listeria monocytogenes NOT DETECTED NOT DETECTED Final   Staphylococcus species DETECTED (A) NOT DETECTED Final    Comment: Methicillin (oxacillin) resistant coagulase negative staphylococcus. Possible blood culture contaminant (unless isolated from more than one blood culture draw or clinical case suggests pathogenicity). No antibiotic treatment is indicated for blood  culture contaminants. CRITICAL RESULT CALLED TO, READ BACK BY AND VERIFIED WITH: N. Batchelder Pharm.D. 9:55 06/29/17 ([email or web address])    Staphylococcus aureus NOT DETECTED NOT DETECTED Final   Methicillin resistance DETECTED (A) NOT DETECTED Final    Comment: CRITICAL RESULT CALLED TO, READ BACK BY AND VERIFIED WITH: N. Batchelder Pharm.D. 9:55 06/29/17 ([email or web address])    Streptococcus species NOT DETECTED NOT DETECTED Final   Streptococcus agalactiae NOT DETECTED NOT DETECTED Final   Streptococcus pneumoniae NOT DETECTED NOT DETECTED Final   Streptococcus pyogenes NOT DETECTED NOT DETECTED Final   Acinetobacter baumannii NOT DETECTED NOT DETECTED Final   Enterobacteriaceae species NOT DETECTED NOT DETECTED Final   Enterobacter cloacae complex NOT DETECTED NOT DETECTED Final   Escherichia coli NOT DETECTED NOT DETECTED Final   Klebsiella oxytoca NOT DETECTED NOT DETECTED Final   Klebsiella pneumoniae NOT DETECTED NOT DETECTED Final   Proteus species NOT DETECTED NOT DETECTED Final   Serratia marcescens NOT DETECTED NOT DETECTED Final   Haemophilus influenzae NOT DETECTED NOT DETECTED Final   Neisseria meningitidis NOT DETECTED NOT DETECTED Final   Pseudomonas aeruginosa NOT DETECTED NOT DETECTED Final   Candida albicans NOT DETECTED NOT DETECTED Final   Candida glabrata NOT DETECTED NOT DETECTED Final   Candida krusei NOT DETECTED NOT DETECTED Final   Candida parapsilosis NOT DETECTED NOT DETECTED Final   Candida tropicalis NOT DETECTED NOT DETECTED Final

## 2017-06-29 NOTE — Progress Notes (Signed)
Flushed drain at 0500.

## 2017-06-29 NOTE — Progress Notes (Signed)
ANTICOAGULATION CONSULT NOTE -  Pharmacy Consult for Heparin Indication: Leg ischemia   Allergies  Allergen Reactions  . Codeine   . Latex     Blisters  . Penicillins     Patient Measurements: Height: 5\' 2"  (157.5 cm) Weight: 175 lb 8 oz (79.6 kg) IBW/kg (Calculated) : 50.1 Heparin Dosing Weight: 80 kg   Vital Signs: Temp: 98.8 F (37.1 C) (10/04 2110) Temp Source: Oral (10/04 2110) BP: 134/70 (10/04 1643) Pulse Rate: 106 (10/04 1209)  Labs:  Recent Labs  06/27/17 1550  06/27/17 1803 06/28/17 0423 06/28/17 0752 06/28/17 1913 06/29/17 0500 06/29/17 1044 06/29/17 2310  HGB  --   < > 10.4* 9.5*  --   --  8.8*  --   --   HCT  --   < > 30.9* 27.6*  --   --  25.9*  --   --   PLT  --   --  196 192  --   --  152  --   --   APTT  --   --  155*  --  70*  --   --  49*  --   LABPROT  --   --   --   --   --   --   --  14.5  --   INR  --   --   --   --   --   --   --  1.14  --   HEPARINUNFRC  --   --   --   --   --  0.13*  --  0.29* 0.36  CREATININE 0.71  --   --  0.74  --   --  0.90  --   --   < > = values in this interval not displayed.  Estimated Creatinine Clearance: 58.5 mL/min (by C-G formula based on SCr of 0.9 mg/dL).  Assessment: 70 yof s/p ex lap with lysis of adhesions and small bowel perforation repair on 9/24, admitted with ischemic left limb. He is now s/p bilateral femoral popliteal embolectomy and left leg fasciotomy on 10/2.  Hep lvl within goal range 0.36  Goal of Therapy:  Heparin level 0.3-0.5 units/ml Monitor platelets by anticoagulation protocol: Yes   Plan:  -Continue heparin 1050 units/hr -Daily hep lvl cbc -Monitor CBC and s/s of bleeding   Levester Fresh, PharmD, BCPS, BCCCP Clinical Pharmacist Clinical phone for 06/29/2017 from 7a-3:30p: [phone] If after 3:30p, please call main pharmacy at: [phone] 06/29/2017 11:56 PM

## 2017-06-29 NOTE — Progress Notes (Signed)
Referring Physician(s): Dr. Serita Grammes  Supervising Physician: Daryll Brod  Patient Status: [facility] - In-pt  Chief Complaint: IAA  Subjective: Patient with no complaints.  On the phone and doesn't interact much  Allergies: Codeine; Latex; and Penicillins  Medications: Prior to Admission medications   Medication Sig Start Date End Date Taking? Authorizing Provider  amLODipine (NORVASC) 5 MG tablet Take 5 mg by mouth daily.  09/11/13  Yes [provider]  atenolol (TENORMIN) 50 MG tablet Take 25 mg by mouth daily.  08/24/13  Yes [provider]  betamethasone dipropionate (DIPROLENE) 0.05 % cream  04/27/14  Yes [provider]  butalbital-acetaminophen-caffeine (FIORICET, ESGIC) 50-325-40 MG per tablet Take 2 tablets by mouth every 6 (six) hours as needed for headache or migraine.  09/27/13  Yes [provider]  dicyclomine (BENTYL) 10 MG capsule Take 10 mg by mouth 3 (three) times daily before meals.  07/26/13  Yes [provider]  furosemide (LASIX) 20 MG tablet Take 20 mg by mouth daily.  12/17/13  Yes [provider]  gabapentin (NEURONTIN) 300 MG capsule Take 300 mg by mouth 3 (three) times daily.  08/26/13  Yes [provider]  levothyroxine (SYNTHROID, LEVOTHROID) 25 MCG tablet Take 25 mcg by mouth daily.  09/25/13  Yes [provider]  lisinopril (PRINIVIL,ZESTRIL) 10 MG tablet Take 10 mg by mouth daily.  07/22/13  Yes [provider]  omeprazole (PRILOSEC) 40 MG capsule Take 40 mg by mouth daily.  07/26/13  Yes [provider]  simvastatin (ZOCOR) 40 MG tablet Take 40 mg by mouth daily.  07/04/13  Yes [provider]    Vital Signs: BP 122/71 (BP Location: Right Arm)   Pulse 93   Temp 99 F (37.2 C) (Oral)   Resp 14   Ht 5\' 2"  (1.575 m)   Wt 175 lb 8 oz (79.6 kg)   SpO2 99%   BMI 32.10 kg/m   Physical Exam: Abd: soft, minimally tender around drain.  Drain with  90cc/24hrs of serous output.  Drain site is c/d/i  Imaging: Dg Abd Portable 1v  Result Date: 06/28/2017 CLINICAL DATA:  Abdominal pain and nausea. EXAM: PORTABLE ABDOMEN - 1 VIEW COMPARISON:  None. FINDINGS: The bowel gas pattern is normal. No radio-opaque calculi or other significant radiographic abnormality are seen. Pigtail drainage catheter seen in the pelvis. Enteric contrast from prior CT. Degenerative change both hips. IMPRESSION: No obstruction or visible free air. Electronically Signed   By: Staci Righter M.D.   On: 06/28/2017 13:47    Labs:  CBC:  Recent Labs  06/27/17 1616 06/27/17 1803 06/28/17 0423 06/29/17 0500  WBC  --  27.6* 27.2* 22.4*  HGB 10.9* 10.4* 9.5* 8.8*  HCT 32.0* 30.9* 27.6* 25.9*  PLT  --  196 192 152    COAGS:  Recent Labs  06/27/17 1803 06/28/17 0752  APTT 155* 70*    BMP:  Recent Labs  06/27/17 1550 06/27/17 1616 06/28/17 0423 06/29/17 0500  NA 133* 138 135 136  K 4.3 4.9 4.7 4.4  CL 111  --  108 109  CO2 19*  --  20* 18*  GLUCOSE 92  --  107* 53*  BUN <5*  --  5* 5*  CALCIUM 6.8*  --  6.7* 6.7*  CREATININE 0.71  --  0.74 0.90  GFRNONAA >60  --  >60 >60  GFRAA >60  --  >60 >60    LIVER FUNCTION TESTS:  Recent  Labs  06/27/17 1550 06/29/17 0500  BILITOT 0.4 0.7  AST 44* 42*  ALT 22 20  ALKPHOS 47 51  PROT 4.3* 4.8*  ALBUMIN 1.4* 1.7*    Assessment and Plan: 1.  Intra-abdominal fluid collection, s/p perc drain  Drain still with 90cc of output in 24 hrs. Cont with drain and drain flushes for now Plan for repeat CT when output below 10-15cc/day  Electronically Signed: Eliza Grissinger E 06/29/2017, 11:24 AM   I spent a total of 15 Minutes at the the patient's bedside AND on the patient's hospital floor or unit, greater than 50% of which was counseling/coordinating care for intra-abdominal abscess

## 2017-06-29 NOTE — Progress Notes (Signed)
Patient ID: Anna Hayes, female   DOB: 1951-01-04, 66 y.o.   MRN: [id number]  [facility] Surgery Progress Note  2 Days Post-Op  Subjective: CC- depressed Patient states that she was told this morning she may still require a LLE amputation. Abdomen doing well. Nausea resolved. Tolerating clears. Passing flatus.   Objective: Vital signs in last 24 hours: Temp:  [97.5 F (36.4 C)-99 F (37.2 C)] 99 F (37.2 C) (10/04 0401) Pulse Rate:  [92-103] 93 (10/04 0401) Resp:  [12-16] 14 (10/04 0401) BP: (116-124)/(44-71) 122/71 (10/04 0401) SpO2:  [97 %-100 %] 99 % (10/04 0401) Last BM Date:  (prior to arrival)  Intake/Output from previous day: 10/03 0701 - 10/04 0700 In: 868.3 [I.V.:168.3; IV Piggyback:700] Out: 1490 [Urine:1400; Drains:90] Intake/Output this shift: No intake/output data recorded.  PE: Gen:  Alert, NAD, pleasant HEENT: EOM's intact, pupils equal and round Card:  RRR, no M/G/R heard Pulm:  CTAB, no W/R/R, effort normal Abd: Soft, mild distension, NT, +BS, open midline incision with persistent purulent drainage at base, TG drain with minimal serosanguinous drainage Psych: A&Ox3  Skin: no rashes noted, warm and dry  Lab Results:   Recent Labs  06/27/17 1803 06/28/17 0423  WBC 27.6* 27.2*  HGB 10.4* 9.5*  HCT 30.9* 27.6*  PLT 196 192   BMET  Recent Labs  06/27/17 1550 06/27/17 1616 06/28/17 0423  NA 133* 138 135  K 4.3 4.9 4.7  CL 111  --  108  CO2 19*  --  20*  GLUCOSE 92  --  107*  BUN <5*  --  5*  CREATININE 0.71  --  0.74  CALCIUM 6.8*  --  6.7*   PT/INR No results for input(s): LABPROT, INR in the last 72 hours. CMP     Component Value Date/Time   NA 135 06/28/2017 0423   K 4.7 06/28/2017 0423   CL 108 06/28/2017 0423   CO2 20 (L) 06/28/2017 0423   GLUCOSE 107 (H) 06/28/2017 0423   BUN 5 (L) 06/28/2017 0423   CREATININE 0.74 06/28/2017 0423   CALCIUM 6.7 (L) 06/28/2017 0423   PROT 4.3 (L) 06/27/2017 1550   ALBUMIN 1.4  (L) 06/27/2017 1550   AST 44 (H) 06/27/2017 1550   ALT 22 06/27/2017 1550   ALKPHOS 47 06/27/2017 1550   BILITOT 0.4 06/27/2017 1550   GFRNONAA >60 06/28/2017 0423   GFRAA >60 06/28/2017 0423   Lipase  No results found for: LIPASE     Studies/Results: Dg Abd Portable 1v  Result Date: 06/28/2017 CLINICAL DATA:  Abdominal pain and nausea. EXAM: PORTABLE ABDOMEN - 1 VIEW COMPARISON:  None. FINDINGS: The bowel gas pattern is normal. No radio-opaque calculi or other significant radiographic abnormality are seen. Pigtail drainage catheter seen in the pelvis. Enteric contrast from prior CT. Degenerative change both hips. IMPRESSION: No obstruction or visible free air. Electronically Signed   By: Staci Righter M.D.   On: 06/28/2017 13:47    Anti-infectives: Anti-infectives    Start     Dose/Rate Route Frequency Ordered Stop   06/29/17 2300  fluconazole (DIFLUCAN) IVPB 400 mg     400 mg 100 mL/hr over 120 Minutes Intravenous Every 24 hours 06/28/17 1413     06/29/17 0600  cefTRIAXone (ROCEPHIN) 2 g in dextrose 5 % 50 mL IVPB     2 g 100 mL/hr over 30 Minutes Intravenous Every 24 hours 06/28/17 1413     06/28/17 2300  fluconazole (DIFLUCAN) IVPB 800 mg  800 mg 200 mL/hr over 120 Minutes Intravenous  Once 06/28/17 1413 06/29/17 0148   06/28/17 2200  anidulafungin (ERAXIS) 100 mg in sodium chloride 0.9 % 100 mL IVPB  Status:  Discontinued     100 mg 78 mL/hr over 100 Minutes Intravenous Daily at bedtime 06/27/17 2324 06/28/17 1413   06/28/17 0600  levofloxacin (LEVAQUIN) IVPB 500 mg  Status:  Discontinued     500 mg 100 mL/hr over 60 Minutes Intravenous Every 24 hours 06/28/17 0006 06/28/17 1413   06/28/17 0100  anidulafungin (ERAXIS) 200 mg in sodium chloride 0.9 % 200 mL IVPB     200 mg 78 mL/hr over 200 Minutes Intravenous  Once 06/28/17 0015 06/28/17 0549   06/27/17 2359  metroNIDAZOLE (FLAGYL) IVPB 500 mg     500 mg 100 mL/hr over 60 Minutes Intravenous Every 8 hours 06/27/17  2324     06/27/17 2359  levofloxacin (LEVAQUIN) IVPB 500 mg  Status:  Discontinued     500 mg 100 mL/hr over 60 Minutes Intravenous Daily at bedtime 06/27/17 2324 06/28/17 0006   06/27/17 2030  vancomycin (VANCOCIN) IVPB 1000 mg/200 mL premix     1,000 mg 200 mL/hr over 60 Minutes Intravenous Every 12 hours 06/27/17 1741 06/28/17 1110   06/27/17 1345  cefUROXime (ZINACEF) 1.5 g in dextrose 5 % 50 mL IVPB     1.5 g 100 mL/hr over 30 Minutes Intravenous To Surgery 06/27/17 1340 06/27/17 1341       Assessment/Plan Lower limb ischemia - s/p thrombectomy of multiple bilateral vessels and left leg fasciotomy 06/27/17 Dr. Trula Slade. High risk amputation. PAD HTN H/o stroke with residual left sided weakness AKI  SBO - s/p exploratory laparotomy with lysis of adhesions and small bowel perforation repair for SBO at [facility] by Dr. Noberto Retort on 06/19/17 - s/p IR placement of transgluteal drain 10/1 for pelvic fluid collection seen on CT scan with only "thin fluid" returned. IR following here. - drain with 90cc/24hr serosanguinous fluid - tolerating clears  ID - flagyl 10/2>>day#3, rocephin 10/4>>, diflucan 10/4>> VTE - heparin FEN - IVF, full liquids, add Boost Foley - none Follow up - Dr. Noberto Retort  Plan - Labs pending today. Advance to full liquids and add Boost. Increase abdominal wound dressing changes to TID; if wound cleans up well will consider consulting WOC for vac tomorrow. Continue drain per IR.    LOS: 2 days    Wellington Hampshire , [facility] Surgery 06/29/2017, 10:13 AM Pager: [phone] Consults: [phone] Mon-Fri 7:00 am-4:30 pm Sat-Sun 7:00 am-11:30 am

## 2017-06-29 NOTE — Progress Notes (Signed)
ANTICOAGULATION CONSULT NOTE -  Pharmacy Consult for Heparin Indication: Leg ischemia   Allergies  Allergen Reactions  . Codeine   . Latex     Blisters  . Penicillins     Patient Measurements: Height: 5\' 2"  (157.5 cm) Weight: 175 lb 8 oz (79.6 kg) IBW/kg (Calculated) : 50.1 Heparin Dosing Weight: 80 kg   Vital Signs: Temp: 99 F (37.2 C) (10/04 0401) Temp Source: Oral (10/04 0401) BP: 122/71 (10/04 0401) Pulse Rate: 93 (10/04 0401)  Labs:  Recent Labs  06/27/17 1550  06/27/17 1803 06/28/17 0423 06/28/17 0752 06/28/17 1913 06/29/17 0500 06/29/17 1044  HGB  --   < > 10.4* 9.5*  --   --  8.8*  --   HCT  --   < > 30.9* 27.6*  --   --  25.9*  --   PLT  --   --  196 192  --   --  152  --   APTT  --   --  155*  --  70*  --   --  49*  LABPROT  --   --   --   --   --   --   --  14.5  INR  --   --   --   --   --   --   --  1.14  HEPARINUNFRC  --   --   --   --   --  0.13*  --  0.29*  CREATININE 0.71  --   --  0.74  --   --  0.90  --   < > = values in this interval not displayed.  Estimated Creatinine Clearance: 58.5 mL/min (by C-G formula based on SCr of 0.9 mg/dL).   Medical History: Past Medical History:  Diagnosis Date  . Frequent headaches   . Muscle pain   . Palpitations   . Sinus problem   . Stroke [facility])     Medications:  Prescriptions Prior to Admission  Medication Sig Dispense Refill Last Dose  . amLODipine (NORVASC) 5 MG tablet Take 5 mg by mouth daily.    06/26/2017  . atenolol (TENORMIN) 50 MG tablet Take 25 mg by mouth daily.    06/26/2017 at 08ish  . betamethasone dipropionate (DIPROLENE) 0.05 % cream    06/28/2017 at 1  . butalbital-acetaminophen-caffeine (FIORICET, ESGIC) 50-325-40 MG per tablet Take 2 tablets by mouth every 6 (six) hours as needed for headache or migraine.    PRN  . dicyclomine (BENTYL) 10 MG capsule Take 10 mg by mouth 3 (three) times daily before meals.    06/26/2017  . furosemide (LASIX) 20 MG tablet Take 20 mg by mouth daily.     06/26/2017  . gabapentin (NEURONTIN) 300 MG capsule Take 300 mg by mouth 3 (three) times daily.    06/26/2017  . levothyroxine (SYNTHROID, LEVOTHROID) 25 MCG tablet Take 25 mcg by mouth daily.    06/26/2017  . lisinopril (PRINIVIL,ZESTRIL) 10 MG tablet Take 10 mg by mouth daily.    06/26/2017  . omeprazole (PRILOSEC) 40 MG capsule Take 40 mg by mouth daily.    06/26/2017  . simvastatin (ZOCOR) 40 MG tablet Take 40 mg by mouth daily.    06/26/2017    Assessment: 3 yof s/p ex lap with lysis of adhesions and small bowel perforation repair on 9/24, admitted with ischemic left limb. He is now s/p bilateral femoral popliteal embolectomy and left leg fasciotomy on 10/2.  Heparin level  is slightly subtherapeutic at 0.29. CBC relatively stable with no reported bleeding. Remains at high risk to require BKA.   Goal of Therapy:  Heparin level 0.3-0.5 units/ml Monitor platelets by anticoagulation protocol: Yes   Plan:  -Increase heparin infusion to 1050 units/hr -Repeat heparin level in 8 hours  -Monitor CBC and s/s of bleeding   Vincenza Hews, PharmD, BCPS 06/29/2017, 11:57 AM

## 2017-06-29 NOTE — Consult Note (Addendum)
WOC requested for possible abd Vac dressing tomorrow.Vascular team following for assessment and plan of care for leg wound.  Surgical team following for abd wound. Will plan to assess in the am as requested and apply if appropriate. Julien Girt MSN, RN, [place], [place], [place]

## 2017-06-29 NOTE — Progress Notes (Signed)
Physical Therapy Treatment Patient Details Name: Bruce Mayers MRN: [id number] DOB: Mar 26, 1951 Today's Date: 06/29/2017    History of Present Illness Pt is a 66 y.o. female with medical history significant of CVA with left arm weakness present to  with a sbo after experincing symptoms for ten days.she went to OR on 9/24 had lysis of adhesions and repair of perforated bowel.  Post op she developed AKI with peak cr of 2.1 this stabalized with ivf.  On 9/27 she started developing some fevers and worsening leukocytosis.  ble u/s was done which was neg for DVT.  Ct scan of abd/pelvis showed a developing absess which was drained by IR on 10/1.  She was started on levaquin /flagyl on 9/27.  Blood cultures from 9/27 also growing out fungus therefore casopofugin was started on 10/1 after their doctor consulted with [facility].  On the evening around 9/30 she then developed an ischemic left limb.  A stat cta with run off was performed with showed bilateral occlusion to both legs.  Pt was then arranged to be transferred here to get emergent vascular surgery which she has gotten s/p thrombectomy of bilateral vessels(see vascular report) and left leg fasciotomy.     PT Comments    Patient tolerated standing X2 with use of Stedy. Pt required +2 assist to stand and to safely sit. Pt limited by pain in bilat LE (L > R) and L UE/LE edematous. Continue to progress as tolerated.    Follow Up Recommendations  CIR     Equipment Recommendations  Other (comment) (to be determined at next venue)    Recommendations for Other Services Rehab consult     Precautions / Restrictions Precautions Precautions: None Restrictions Weight Bearing Restrictions: No    Mobility  Bed Mobility Overal bed mobility: Needs Assistance Bed Mobility: Supine to Sit     Supine to sit: Mod assist;+2 for physical assistance;HOB elevated     General bed mobility comments: assist to bring bilat LE to EOB, scoot hips  toward EOB, and elevate trunk into sitting; cues for sequencing and technique  Transfers Overall transfer level: Needs assistance Equipment used: 2 person hand held assist Transfers: Sit to/from Stand Sit to Stand: Min assist;Mod assist;+2 physical assistance;From elevated surface         General transfer comment: mod A +2 for initial stand from elevated EOB and min A +2 from [place] with mod A +2 to descend to recliner; cues for sequencing and technique; therapist assisted with L UE during transfers  Ambulation/Gait             General Gait Details: bilat LE too painful this session to attempt   Stairs            Wheelchair Mobility    Modified Rankin (Stroke Patients Only)       Balance Overall balance assessment: Needs assistance Sitting-balance support: Feet supported;No upper extremity supported Sitting balance-Leahy Scale: Fair                                      Cognition Arousal/Alertness: Awake/alert Behavior During Therapy: WFL for tasks assessed/performed Overall Cognitive Status: Within Functional Limits for tasks assessed  Exercises      General Comments        Pertinent Vitals/Pain Pain Assessment: Faces Faces Pain Scale: Hurts even more Pain Location: bilat LE (L > R) Pain Descriptors / Indicators: Grimacing;Guarding;Moaning;Aching;Sore Pain Intervention(s): Limited activity within patient's tolerance;Monitored during session;Repositioned;Premedicated before session    Home Living                      Prior Function            PT Goals (current goals can now be found in the care plan section) Acute Rehab PT Goals PT Goal Formulation: With patient Time For Goal Achievement: 07/12/17 Potential to Achieve Goals: Good Progress towards PT goals: Progressing toward goals    Frequency    Min 3X/week      PT Plan Current plan remains appropriate     Co-evaluation              AM-PAC PT "6 Clicks" Daily Activity  Outcome Measure  Difficulty turning over in bed (including adjusting bedclothes, sheets and blankets)?: Unable Difficulty moving from lying on back to sitting on the side of the bed? : Unable Difficulty sitting down on and standing up from a chair with arms (e.g., wheelchair, bedside commode, etc,.)?: Unable Help needed moving to and from a bed to chair (including a wheelchair)?: A Lot Help needed walking in hospital room?: Total Help needed climbing 3-5 steps with a railing? : Total 6 Click Score: 7    End of Session Equipment Utilized During Treatment: Gait belt Activity Tolerance: Patient limited by pain Patient left: in chair;with call bell/phone within reach;with chair alarm set Nurse Communication: Mobility status PT Visit Diagnosis: Unsteadiness on feet (R26.81);Other abnormalities of gait and mobility (R26.89);Muscle weakness (generalized) (M62.81);Difficulty in walking, not elsewhere classified (R26.2);Hemiplegia and hemiparesis;Pain Hemiplegia - Right/Left: Left Hemiplegia - dominant/non-dominant: Non-dominant Hemiplegia - caused by: Cerebral infarction Pain - Right/Left: Left Pain - part of body: Ankle and joints of foot;Leg     Time: 1950-9326 PT Time Calculation (min) (ACUTE ONLY): 36 min  Charges:  $Therapeutic Activity: 23-37 mins                    G Codes:       Earney Navy, PTA Pager: [phone]     Darliss Cheney 06/29/2017, 1:20 PM

## 2017-06-29 NOTE — Progress Notes (Signed)
PROGRESS NOTE  Anna Hayes PYK:[id number] DOB: 07/24/1951 DOA: 06/27/2017 PCP: Rochel Brome, MD  Brief Narrative: From discharge summary: 66 year old woman presented with abdominal pain and was admitted to [facility] 9/22. Found to have small bowel obstruction. Underwent expiratory laparotomy with lysis of adhesions and release of small bowel obstruction, repair of small bowel perforation. Subsequently had fevers, leukocytosis and CT abdomen and pelvis 9/29 revealed pelvic abscess and underwent successful placement of percutaneous drain October 1. Also noted to have fungemia on 1/2 blood culture 9/27 and started on antifungal therapy. She subsequently developed left lower stomach pain and discomfort October 1 and underwent CT angiogram which revealed bilateral arterial blockage both lower extremities, was started on heparin and emergently transferred to [facility] where she underwent thrombectomy and fasciotomy October 2.  Assessment/Plan Critical left leg ischemia, status post bilateral femoral embolectomy, angioplasty left posterior tibial artery, fasciotomy left leg 10/2. -management per vascular surgery. LLE remains at risk, may need BKA.  Status post laparotomy, lysis of adhesions and repair of perforated bowel 9/24 at [facility]. -management per general surgery. AXR no acute abnormalities  Abdominal abscess, status post percutaneous drain placement October 1. On treatment with Levaquin and Flagyl since 9/27. -Continue antibiotics, appreciate IR involvement. Repeat CT when drain output decreases.  Fungemia Candida tropicalis, started on caspofungin 10/1 -change to Diflucan per infectious disease to complete 2 weeks. Patient needs outpatient ophthalmologist funduscopicexam  Microcytic anemia, likely multifactorial including perioperative blood loss. - trending down with acute illness.Follow clinically. Repeat CBC in a.m.  Status post AKI -  PMH stroke with left upper and  left lower extremity weakness, left facial droop.  DVT prophylaxis: heparin infusion  Code Status: full Family Communication: none Disposition Plan: pending    Murray Hodgkins, MD  [facility] Direct contact: [phone] --Via amion app OR  --[email or web address]; password [email or web address]  7PM-7AM contact night coverage as above 06/29/2017, 10:19 AM  LOS: 2 days   Consultants:  Vascular surgery  General surgery  ID  IR  PMR  Procedures:  Exploratory laparotomy 9/24 with lysis of adhesions and release of small bowel obstruction, repair of small bowel perforation.  Percutaneous drain placement right buttock for pelvic abscess October 1.   10/2:   BILATERAL FEMORAL POPLITEAL EMBOLECTOMY (Bilateral)  PATCH ANGIOPLASTY Left Posterior Tibial Artery (Left)  FULL COMPARTMENT LEFT LOWER LEG FASCIOTOMY (Left)  Antimicrobials: Anidulafungin 10/2 >>10/3 Fluconazole 10/3 >> 10/17 Levaquin 10/2 Metronidazole 10/2  Interval history/Subjective:  Feels better today. No nausea or vomiting. Tolerating liquids. Mild abdominal pain.Pain in left leg with movement.  Objective: Vitals: afebrile, 99.0, 14, 91/66, 99% on room air.  Exam:     Constitutional. Appears calm, comfortable.  Respiratory. Clear to auscultation bilaterally. No wheezes, rales or rhonchi. Normal respiratory effort.  Cardiovascular. Regular rate and rhythm. No murmur, rub or gallop. LLE edema noted, telemetry SR  Abdomen soft  Skin. Blistering left foot, foot dark.  Psych: grossly normal mood and affect, speech fluent and appropriate  Eyes. Pupils, irises, lips appear unremarkable.  ENT. Grossly normalps, tongue.   I have personally reviewed the following:   Labs:  WBC trending down, 22.4, hemoglobin trending down 8.8, platelets stable 152.  Review and summation of old records:  Discharge summary: 66 year old woman presented with abdominal pain and was admitted to [facility] 9/22. Found to  have small bowel obstruction. Underwent expiratory laparotomy with lysis of adhesions and release of small bowel obstruction, repair of small bowel perforation. Subsequently had fevers, leukocytosis and CT abdomen  and pelvis 9/29 revealed pelvic abscess and underwent successful placement of percutaneous drain October 1. Also noted to have fungemia on 1/2 blood culture 9/27 and started on antifungal therapy. She subsequently developed left lower stomach pain and discomfort October 1 and underwent CT angiogram which revealed bilateral arterial blockage both lower extremities, was started on heparin and emergently transferred to [facility].  Operative report 9/24: Small bowel obstruction. Exploratory laparotomy, lysis of adhesions with release of small bowel obstruction, repair small bowel perforation.  CT abdomen and pelvis 9/29: Bilocular fluid collection in the pelvis. 0.4 x 8.4 x 3.7.  10/1 CT-guided right transgluteal drain placement.  Scheduled Meds: . amLODipine  5 mg Oral Daily  . docusate sodium  100 mg Oral Daily  . gabapentin  300 mg Oral BID  . [START ON 06/30/2017] Influenza vac split quadrivalent PF  0.5 mL Intramuscular Tomorrow-1000  . lisinopril  10 mg Oral Daily  . pantoprazole  40 mg Oral Daily  . simvastatin  20 mg Oral q1800   Continuous Infusions: . sodium chloride    . sodium chloride    . cefTRIAXone (ROCEPHIN)  IV 2 g (06/29/17 0618)  . fluconazole (DIFLUCAN) IV    . heparin 950 Units/hr (06/28/17 2330)  . magnesium sulfate 1 - 4 g bolus IVPB    . metronidazole Stopped (06/29/17 0622)    Principal Problem:   Critical lower limb ischemia Active Problems:   PAD (peripheral artery disease) (HCC)   Stroke (HCC)   SBO (small bowel obstruction) (HCC)   Intraabdominal fluid collection absess   Fungemia   Abdominal pain   History of CVA with residual deficit   Tobacco abuse   Acute blood loss anemia   Post-operative pain   Leukocytosis   LOS: 2 days

## 2017-06-29 NOTE — Progress Notes (Signed)
  Progress Note    06/29/2017 7:47 AM 2 Days Post-Op  Subjective:  Feels better than she did last night  Tm 99 HR 80's-100's NSR 834'H-962'I systolic 29% RA  Vitals:   06/28/17 2354 06/29/17 0401  BP: (!) 118/44 122/71  Pulse: (!) 102 93  Resp: 16 14  Temp: 98.2 F (36.8 C) 99 F (37.2 C)  SpO2: 97% 99%    Physical Exam: Cardiac:  regular Lungs:  Non labored Incisions:  Bilateral groins are clean and dry; wound vacs in place with good seal LLE; right leg incisions are healing nicely. Extremities:  Easily palpable right DP pulse; + left doppler signal > DP/peroneal      Abdomen:  Bandage in place  CBC    Component Value Date/Time   WBC 27.2 (H) 06/28/2017 0423   RBC 3.75 (L) 06/28/2017 0423   HGB 9.5 (L) 06/28/2017 0423   HCT 27.6 (L) 06/28/2017 0423   PLT 192 06/28/2017 0423   MCV 73.6 (L) 06/28/2017 0423   MCH 25.3 (L) 06/28/2017 0423   MCHC 34.4 06/28/2017 0423   RDW 16.5 (H) 06/28/2017 0423    BMET    Component Value Date/Time   NA 135 06/28/2017 0423   K 4.7 06/28/2017 0423   CL 108 06/28/2017 0423   CO2 20 (L) 06/28/2017 0423   GLUCOSE 107 (H) 06/28/2017 0423   BUN 5 (L) 06/28/2017 0423   CREATININE 0.74 06/28/2017 0423   CALCIUM 6.7 (L) 06/28/2017 0423   GFRNONAA >60 06/28/2017 0423   GFRAA >60 06/28/2017 0423    INR No results found for: INR   Intake/Output Summary (Last 24 hours) at 06/29/17 0747 Last data filed at 06/29/17 0516  Gross per 24 hour  Intake           868.31 ml  Output             1490 ml  Net          -621.69 ml     Assessment:  66 y.o. female is s/p:  #1: Bilateral common femoral artery exposure #2: Bilateral popliteal artery exposure #3: Left Posterior tibial artery exposure #4: Thrombectomy of right CFA, PFA, CFA, popliteal, anterior and posterior tibial vessels #5: Thrombectomy of left CFA, PFA, SFA, popliteal, anter tibial, and posterior tibial arteries #6: Vein patch angioplasty of left posterior  tibial artery #7: 4 compartment left leg fasciotomy #8: Wound vac placement  1 Day Post-Op  2 Days Post-Op  Plan: -right foot has palpable DP pulse & motor and sensation are in tact; left foot with monophasic doppler signal DP/PT/peroneal.  Left foot is swollen with some blistering forming and demarcating.  Motor and sensation are not in tact left foot.   She is at high risk for amputation. -wound vac change today -DVT prophylaxis:  Heparin gtt -continue to mobilize-PT is recommending CIR -abdominal wound per general surgery; ID will continue ceftriaxone and metronidazole for intra-abdominal abscess.   Leontine Locket, PA-C [facility] [phone] 06/29/2017 7:47 AM   I agree with the above.  I seen and evaluated the patient.  She continues to have brisk Doppler signals down to the ankle.  Foot is more mottled.  She has minimal movement here.  I discussed with the patient that I am not sure if her foot is salvageable.  We will continue to monitor this.  She may require below-knee amputation  Wells Brabham

## 2017-06-29 NOTE — Consult Note (Signed)
[facility] CM Primary Care Navigator  06/29/2017  Anna Hayes 24-Nov-1950 [id number]   Met with patient at the bedside to identify possible discharge needs.  Patient reports that it started with "worsening abdominal pain" and went to [facility] with small bowel obstruction s/p explor lap and repair of small bowel perforation. She developed discoloration and coldness to left foot, hence, transferred to [facility] and "underwent surgery to remove blood clots from left leg" which resulted to this admission/ surgery.  Patient endorses Darrol Jump with [facility] primary care provider.   [facility] [address] [facility] without [facility].   She reports managingherown medications at home using "pill organizer".  Patient states that her daughter Anna Hayes) has been providing transportationto herdoctors' appointments, if not, she uses public transportation ([employer]).  Daughter and grandson lives with her. She verbalized that daughter is her primary caregiver at home.   [facility].  Patient expressed understanding to call primary care provider's officewhen she returns back home,for a post discharge follow-up appointment within a week or sooner if needed.Patient letter (with PCP's contact number) was provided as areminder.  Discussedwithpatient about [facility] CM services available for health management at home but denies any current needs or concerns at this time.  Patient verbalized understanding to [facility] primary care provider to [facility] care management services asdeemed necessary in the future.  [facility] care management information provided for future needs that may arise.    For questions, please contact:  Dannielle Huh, BSN, RN- [facility] Primary Care Navigator  Telephone: [phone] [place]

## 2017-06-29 NOTE — Progress Notes (Signed)
I met with pt briefly at bedside to begin discussions of her rehab needs in the future. I will follow her progress to assist depending on her functional level when medically ready to d/c [phone]

## 2017-06-30 DIAGNOSIS — G5792 Unspecified mononeuropathy of left lower limb: Secondary | ICD-10-CM

## 2017-06-30 DIAGNOSIS — R1013 Epigastric pain: Secondary | ICD-10-CM

## 2017-06-30 LAB — BASIC METABOLIC PANEL
ANION GAP: 7 (ref 5–15)
BUN: <5 mg/dL — ABNORMAL LOW (ref 6–20)
CO2: 21 mmol/L — ABNORMAL LOW (ref 22–32)
CREATININE: 0.89 mg/dL (ref 0.44–1.00)
Calcium: 6.9 mg/dL — ABNORMAL LOW (ref 8.9–10.3)
Chloride: 110 mmol/L (ref 101–111)
Glucose, Bld: 102 mg/dL — ABNORMAL HIGH (ref 65–99)
Potassium: 3.3 mmol/L — ABNORMAL LOW (ref 3.5–5.1)
SODIUM: 138 mmol/L (ref 135–145)

## 2017-06-30 LAB — HEPATITIS C ANTIBODY (REFLEX)

## 2017-06-30 LAB — CBC
HEMATOCRIT: 25.6 % — AB (ref 36.0–46.0)
Hemoglobin: 8.2 g/dL — ABNORMAL LOW (ref 12.0–15.0)
MCH: 24.7 pg — ABNORMAL LOW (ref 26.0–34.0)
MCHC: 32 g/dL (ref 30.0–36.0)
MCV: 77.1 fL — AB (ref 78.0–100.0)
Platelets: 279 10*3/uL (ref 150–400)
RBC: 3.32 MIL/uL — AB (ref 3.87–5.11)
RDW: 18.7 % — ABNORMAL HIGH (ref 11.5–15.5)
WBC: 20.5 10*3/uL — AB (ref 4.0–10.5)

## 2017-06-30 LAB — HEPARIN LEVEL (UNFRACTIONATED): Heparin Unfractionated: 0.54 IU/mL (ref 0.30–0.70)

## 2017-06-30 LAB — HCV COMMENT:

## 2017-06-30 LAB — HIV ANTIBODY (ROUTINE TESTING W REFLEX): HIV Screen 4th Generation wRfx: NONREACTIVE

## 2017-06-30 MED ORDER — POTASSIUM CHLORIDE 20 MEQ PO PACK: 40.0000 meq | PACK | Freq: Two times a day (BID) | ORAL | Status: DC

## 2017-06-30 MED ORDER — POTASSIUM CHLORIDE 20 MEQ PO PACK
40.0000 meq | PACK | Freq: Once | ORAL | Status: AC
  Administered 2017-06-30: 40 meq via ORAL
  Filled 2017-06-30: qty 2

## 2017-06-30 MED ORDER — ENSURE ENLIVE PO LIQD
237.0000 mL | Freq: Two times a day (BID) | ORAL | Status: DC
  Administered 2017-07-01 – 2017-07-10 (×13): 237 mL via ORAL

## 2017-06-30 MED ORDER — ADULT MULTIVITAMIN W/MINERALS CH
1.0000 | ORAL_TABLET | Freq: Every day | ORAL | Status: DC
  Administered 2017-07-01 – 2017-07-12 (×11): 1 via ORAL
  Filled 2017-06-30 (×12): qty 1

## 2017-06-30 NOTE — Consult Note (Signed)
[facility] Nurse wound consult note Vascular team following for assessment and plan of care to left leg wound and vac dressing. Reason for Consult: Requested to apply Vac to abd wound Wound type: Full thickness post-op wound with sutures visible to inner wound bed Measurement: 17X13X5cm with undermining to wound edges to 4cm Wound bed: Red and moist Drainage (amount, consistency, odor) small amt yellow drainage, no odor Periwound: Intact skin surrounding Dressing procedure/placement/frequency: Applied Mepitel contact layer, then one piece white foam, then one piece black foam.  Pt medicated for pain prior to the procedure and tolerated with mod amt discomfort.  WOC will plan to change Abd Vac dressing Q M/W/F.  Discussed plan of care and pt verbalized understanding. Julien Girt MSN, RN, [place], [place], [place]

## 2017-06-30 NOTE — Progress Notes (Addendum)
Initial Nutrition Assessment  DOCUMENTATION CODES:   Obesity unspecified  INTERVENTION:    Mighty Shake II on dinner trays, each supplement provides 480-500 kcals and 20-23 grams of protein.  Ensure Enlive po BID, each supplement provides 350 kcal and 20 grams of protein  MVI daily  NUTRITION DIAGNOSIS:   Increased nutrient needs related to wound healing as evidenced by estimated needs.  GOAL:   Patient will meet greater than or equal to 90% of their needs  MONITOR:   PO intake, Diet advancement, Skin  REASON FOR ASSESSMENT:   Consult  (Hypoalbuminemia)  ASSESSMENT:   66 yo female with PMH of stroke, frequent headaches who was admitted to [facility] on 9/22, treated for SBO (exploratory laparotomy), pelvic abscess (percutaneous drain placed), and fungemia. She was emergently transferred to [facility] on 10/2 due to arterial blockage of bilateral LE; S/P thrombectomy and fasciotomy 10/2.   Vascular surgery has recommended L BKA.   Received MD Consult for hypoalbuminemia. Low albumin is a poor indicator of nutrition status. It is reflective of inflammatory process, acute stress response, and levels are highly affected (decreased) by volume overload.   Patient reports minimal intake since admission to [facility] on 9/22. She says she lost a lot of weight during that hospitalization; 166 --> 150 lbs. Now up to 175 lbs (10/2); weight is increased with fluid retention.   Diet advanced to full liquids yesterday. She was happy to get grits with breakfast today. She loves chocolate milk. Has been drinking chocolate Ensure as well.   Nutrition-Focused physical exam completed. Findings are no fat depletion, no muscle depletion, and mild edema.   Labs reviewed. Potassium 3.3 (L) Medications reviewed and include Colace, KCl.  Diet Order:  Diet full liquid Room service appropriate? Yes; Fluid consistency: Thin  Skin:   (multiple surgical incisions; L leg wound; VAC to abd  wound)  Last BM:  10/5  Height:   Ht Readings from Last 1 Encounters:  06/27/17 5\' 2"  (1.575 m)    Weight:   Wt Readings from Last 1 Encounters:  06/27/17 175 lb 8 oz (79.6 kg)    Ideal Body Weight:  50 kg  BMI:  Body mass index is 32.1 kg/m.  Estimated Nutritional Needs:   Kcal:  1600-1800  Protein:  100-120 gm  Fluid:  1.8 L  EDUCATION NEEDS:   No education needs identified at this time  Molli Barrows, [place], [place], [place] Pager [phone] After Hours Pager [phone]

## 2017-06-30 NOTE — Progress Notes (Signed)
Subjective: No new complaints   Antibiotics:  Anti-infectives    Start     Dose/Rate Route Frequency Ordered Stop   06/29/17 2300  fluconazole (DIFLUCAN) IVPB 400 mg     400 mg 100 mL/hr over 120 Minutes Intravenous Every 24 hours 06/28/17 1413     06/29/17 2200  metroNIDAZOLE (FLAGYL) tablet 500 mg     500 mg Oral Every 8 hours 06/29/17 1918     06/29/17 0600  cefTRIAXone (ROCEPHIN) 2 g in dextrose 5 % 50 mL IVPB     2 g 100 mL/hr over 30 Minutes Intravenous Every 24 hours 06/28/17 1413     06/28/17 2300  fluconazole (DIFLUCAN) IVPB 800 mg     800 mg 200 mL/hr over 120 Minutes Intravenous  Once 06/28/17 1413 06/29/17 0148   06/28/17 2200  anidulafungin (ERAXIS) 100 mg in sodium chloride 0.9 % 100 mL IVPB  Status:  Discontinued     100 mg 78 mL/hr over 100 Minutes Intravenous Daily at bedtime 06/27/17 2324 06/28/17 1413   06/28/17 0600  levofloxacin (LEVAQUIN) IVPB 500 mg  Status:  Discontinued     500 mg 100 mL/hr over 60 Minutes Intravenous Every 24 hours 06/28/17 0006 06/28/17 1413   06/28/17 0100  anidulafungin (ERAXIS) 200 mg in sodium chloride 0.9 % 200 mL IVPB     200 mg 78 mL/hr over 200 Minutes Intravenous  Once 06/28/17 0015 06/28/17 0549   06/27/17 2359  metroNIDAZOLE (FLAGYL) IVPB 500 mg  Status:  Discontinued     500 mg 100 mL/hr over 60 Minutes Intravenous Every 8 hours 06/27/17 2324 06/29/17 1918   06/27/17 2359  levofloxacin (LEVAQUIN) IVPB 500 mg  Status:  Discontinued     500 mg 100 mL/hr over 60 Minutes Intravenous Daily at bedtime 06/27/17 2324 06/28/17 0006   06/27/17 2030  vancomycin (VANCOCIN) IVPB 1000 mg/200 mL premix     1,000 mg 200 mL/hr over 60 Minutes Intravenous Every 12 hours 06/27/17 1741 06/28/17 1110   06/27/17 1345  cefUROXime (ZINACEF) 1.5 g in dextrose 5 % 50 mL IVPB     1.5 g 100 mL/hr over 30 Minutes Intravenous To Surgery 06/27/17 1340 06/27/17 1341      Medications: Scheduled Meds: . amLODipine  5 mg Oral Daily  .  docusate sodium  100 mg Oral Daily  . [START ON 07/01/2017] feeding supplement (ENSURE ENLIVE)  237 mL Oral BID BM  . gabapentin  300 mg Oral BID  . Influenza vac split quadrivalent PF  0.5 mL Intramuscular Tomorrow-1000  . lisinopril  10 mg Oral Daily  . metroNIDAZOLE  500 mg Oral Q8H  . multivitamin with minerals  1 tablet Oral Daily  . pantoprazole  40 mg Oral Daily  . potassium chloride  40 mEq Oral Once  . simvastatin  20 mg Oral q1800   Continuous Infusions: . sodium chloride    . sodium chloride    . cefTRIAXone (ROCEPHIN)  IV Stopped (06/30/17 0602)  . fluconazole (DIFLUCAN) IV Stopped (06/30/17 0115)  . heparin 1,000 Units/hr (06/30/17 1026)   PRN Meds:.sodium chloride, acetaminophen **OR** acetaminophen, alum & mag hydroxide-simeth, guaiFENesin-dextromethorphan, hydrALAZINE, morphine injection, ondansetron, phenol    Objective: Weight change:   Intake/Output Summary (Last 24 hours) at 06/30/17 2006 Last data filed at 06/30/17 1908  Gross per 24 hour  Intake           634.03 ml  Output  880 ml  Net          -245.97 ml   Blood pressure (!) 144/65, pulse 93, temperature 98.6 F (37 C), temperature source Oral, resp. rate 15, height 5\' 2"  (1.575 m), weight 175 lb 8 oz (79.6 kg), SpO2 99 %. Temp:  [98.2 F (36.8 C)-98.8 F (37.1 C)] 98.6 F (37 C) (10/05 1200) Pulse Rate:  [93-104] 93 (10/05 1200) Resp:  [15-16] 15 (10/05 1200) BP: (123-153)/(56-90) 144/65 (10/05 1200) SpO2:  [98 %-99 %] 99 % (10/05 1200)  Physical Exam: General: Alert and awake, oriented x3,  HEENT: anicteric sclera, pupils reactive to light and accommodation, EOMI CVS regular rate, normal r,   Chest:  no wheezing, resp distress Abdomen: soft midline bandage  Left foot is cool and with mottled appearance, bullae  06/30/17:       Neuro: nonfocal  CBC:    BMET  Recent Labs  06/29/17 0500 06/30/17 0942  NA 136 138  K 4.4 3.3*  CL 109 110  CO2 18* 21*  GLUCOSE 53*  102*  BUN 5* <5*  CREATININE 0.90 0.89  CALCIUM 6.7* 6.9*     Liver Panel   Recent Labs  06/29/17 0500  PROT 4.8*  ALBUMIN 1.7*  AST 42*  ALT 20  ALKPHOS 51  BILITOT 0.7  BILIDIR 0.2  IBILI 0.5       Sedimentation Rate No results for input(s): ESRSEDRATE in the last 72 hours. C-Reactive Protein No results for input(s): CRP in the last 72 hours.  Micro Results: Recent Results (from the past 720 hour(s))  Culture, blood (Routine X 2) w Reflex to ID Panel     Status: Abnormal (Preliminary result)   Collection Time: 06/28/17 11:00 AM  Result Value Ref Range Status   Specimen Description BLOOD LEFT ARM  Final   Special Requests IN PEDIATRIC BOTTLE Blood Culture adequate volume  Final   Culture  Setup Time   Final    GRAM POSITIVE COCCI IN PEDIATRIC BOTTLE CRITICAL RESULT CALLED TO, READ BACK BY AND VERIFIED WITH: N. Batchelder Pharm.D. 9:55 06/29/17 ([email or web address])    Culture (A)  Final    STAPHYLOCOCCUS SPECIES (COAGULASE NEGATIVE) THE SIGNIFICANCE OF ISOLATING THIS ORGANISM FROM A SINGLE SET OF BLOOD CULTURES WHEN MULTIPLE SETS ARE DRAWN IS UNCERTAIN. PLEASE NOTIFY THE MICROBIOLOGY DEPARTMENT WITHIN ONE WEEK IF SPECIATION AND SENSITIVITIES ARE REQUIRED.    Report Status PENDING  Incomplete  Blood Culture ID Panel (Reflexed)     Status: Abnormal   Collection Time: 06/28/17 11:00 AM  Result Value Ref Range Status   Enterococcus species NOT DETECTED NOT DETECTED Final   Listeria monocytogenes NOT DETECTED NOT DETECTED Final   Staphylococcus species DETECTED (A) NOT DETECTED Final    Comment: Methicillin (oxacillin) resistant coagulase negative staphylococcus. Possible blood culture contaminant (unless isolated from more than one blood culture draw or clinical case suggests pathogenicity). No antibiotic treatment is indicated for blood  culture contaminants. CRITICAL RESULT CALLED TO, READ BACK BY AND VERIFIED WITH: N. Batchelder Pharm.D. 9:55 06/29/17 ([email or web address])     Staphylococcus aureus NOT DETECTED NOT DETECTED Final   Methicillin resistance DETECTED (A) NOT DETECTED Final    Comment: CRITICAL RESULT CALLED TO, READ BACK BY AND VERIFIED WITH: N. Batchelder Pharm.D. 9:55 06/29/17 ([email or web address])    Streptococcus species NOT DETECTED NOT DETECTED Final   Streptococcus agalactiae NOT DETECTED NOT DETECTED Final   Streptococcus pneumoniae NOT DETECTED NOT DETECTED Final   Streptococcus pyogenes NOT DETECTED NOT DETECTED Final  Acinetobacter baumannii NOT DETECTED NOT DETECTED Final   Enterobacteriaceae species NOT DETECTED NOT DETECTED Final   Enterobacter cloacae complex NOT DETECTED NOT DETECTED Final   Escherichia coli NOT DETECTED NOT DETECTED Final   Klebsiella oxytoca NOT DETECTED NOT DETECTED Final   Klebsiella pneumoniae NOT DETECTED NOT DETECTED Final   Proteus species NOT DETECTED NOT DETECTED Final   Serratia marcescens NOT DETECTED NOT DETECTED Final   Haemophilus influenzae NOT DETECTED NOT DETECTED Final   Neisseria meningitidis NOT DETECTED NOT DETECTED Final   Pseudomonas aeruginosa NOT DETECTED NOT DETECTED Final   Candida albicans NOT DETECTED NOT DETECTED Final   Candida glabrata NOT DETECTED NOT DETECTED Final   Candida krusei NOT DETECTED NOT DETECTED Final   Candida parapsilosis NOT DETECTED NOT DETECTED Final   Candida tropicalis NOT DETECTED NOT DETECTED Final  Culture, blood (Routine X 2) w Reflex to ID Panel     Status: None (Preliminary result)   Collection Time: 06/28/17 11:09 AM  Result Value Ref Range Status   Specimen Description BLOOD LEFT HAND  Final   Special Requests IN PEDIATRIC BOTTLE Blood Culture adequate volume  Final   Culture NO GROWTH 2 DAYS  Final   Report Status PENDING  Incomplete    Studies/Results: No results found.    Assessment/Plan:  INTERVAL HISTORY: foot continues to have very poor perfusion   Principal Problem:   Critical lower limb ischemia Active Problems:   PAD (peripheral artery  disease) (HCC)   Stroke (HCC)   SBO (small bowel obstruction) (HCC)   Intraabdominal fluid collection absess   Fungemia   Abdominal pain   History of CVA with residual deficit   Tobacco abuse   Acute blood loss anemia   Post-operative pain   Leukocytosis    Anna Hayes is a 66 y.o. female with  Hx of SBO, perforation sp ex lap complicated by abscess, fungemia with C tropicalis, limb ischemia sp embolectomy and angioplasty  #1 Nonviable foot on the left: agree with [facility] plans to proceed with amputations  #2 Abdominal abscess: continue antibacterial abx and antifungal abx until this is resolved  #3 Fungemia with C tropicalis: pt needs MINIMUM of 14 days of fluconazole  SHE DOES STILL ALSO NEED OPTHO exam to exclude endopthalmitis   Dr. Megan Salon is covering this weekend for questions and I will be back on Monday.     LOS: 3 days   Alcide Evener 06/30/2017, 8:06 PM

## 2017-06-30 NOTE — Progress Notes (Signed)
Occupational Therapy Treatment Patient Details Name: Anna Hayes MRN: [id number] DOB: Oct 03, 1950 Today's Date: 06/30/2017    History of present illness Pt is a 66 y.o. female with medical history significant of CVA with left arm weakness present to [place] with a sbo after experincing symptoms for ten days.she went to OR on 9/24 had lysis of adhesions and repair of perforated bowel.  Post op she developed AKI with peak cr of 2.1 this stabalized with ivf.  On 9/27 she started developing some fevers and worsening leukocytosis.  ble u/s was done which was neg for DVT.  Ct scan of abd/pelvis showed a developing absess which was drained by IR on 10/1.  She was started on levaquin /flagyl on 9/27.  Blood cultures from 9/27 also growing out fungus therefore casopofugin was started on 10/1 after their doctor consulted with [facility].  On the evening around 9/30 she then developed an ischemic left limb.  A stat cta with run off was performed with showed bilateral occlusion to both legs.  Pt was then arranged to be transferred here to get emergent vascular surgery which she has gotten s/p thrombectomy of bilateral vessels(see vascular report) and left leg fasciotomy.    OT comments  Pt assisted to EOB, but had to return to supine for bedpan use (did not feel she wait to perform transfer to [facility]).  Pt required max A for bed mobility.  She continues to be very motivated to regain her independence.  Will continue to follow.   Follow Up Recommendations  [facility];Supervision/Assistance - 24 hour    Equipment Recommendations  None recommended by OT    Recommendations for Other Services Rehab consult    Precautions / Restrictions Precautions Precautions: None       Mobility Bed Mobility Overal bed mobility: Needs Assistance Bed Mobility: Supine to Sit;Sit to Supine Rolling: Mod assist;Min assist   Supine to sit: Max assist Sit to supine: Max assist   General bed mobility comments: Rolls to Lt with  min A, mod A to Rt.  She requires assist to move LE off of and onto bed and to lift and lower trunk.  Assist required to scoot to EOB   Transfers                      Balance Overall balance assessment: Needs assistance Sitting-balance support: Feet supported;No upper extremity supported Sitting balance-Leahy Scale: Fair Sitting balance - Comments: static sitting with supervision EOB                                    ADL either performed or assessed with clinical judgement   ADL                             Toilet Transfer Details (indicate cue type and reason): max A for bedpan placement                  Vision       Perception     Praxis      Cognition Arousal/Alertness: Awake/alert Behavior During Therapy: WFL for tasks assessed/performed Overall Cognitive Status: Within Functional Limits for tasks assessed  Exercises     Shoulder Instructions       General Comments Pt assisted to EOB in prep for transfers and therapeutic activities.  However, pt reports need to have BM, and doesn't feel she can wait to transfer to [facility].  Pt assisted back to supine and was placed on bed pan.  Pt moves very slowly, and requires signifcantly increased time to complete simple mobility     Pertinent Vitals/ Pain       Pain Assessment: Faces Faces Pain Scale: Hurts even more Pain Location: Lt LE and abdomen  Pain Descriptors / Indicators: Grimacing;Guarding;Moaning;Aching;Sore Pain Intervention(s): Monitored during session;Limited activity within patient's tolerance  Home Living                                          Prior Functioning/Environment              Frequency  Min 2X/week        Progress Toward Goals  OT Goals(current goals can now be found in the care plan section)  Progress towards OT goals: Progressing toward goals (slowly )      Plan Discharge plan remains appropriate    Co-evaluation                 AM-PAC PT "6 Clicks" Daily Activity     Outcome Measure   Help from another person eating meals?: A Little Help from another person taking care of personal grooming?: A Little Help from another person toileting, which includes using toliet, bedpan, or urinal?: A Lot Help from another person bathing (including washing, rinsing, drying)?: A Lot Help from another person to put on and taking off regular upper body clothing?: A Lot Help from another person to put on and taking off regular lower body clothing?: Total 6 Click Score: 13    End of Session    OT Visit Diagnosis: Unsteadiness on feet (R26.81);Pain Pain - part of body:  (abdomen )   Activity Tolerance Patient tolerated treatment well   Patient Left in bed;with call bell/phone within reach;with family/visitor present   Nurse Communication Mobility status        Time: 1319-1401 OT Time Calculation (min): 42 min  Charges: OT General Charges $OT Visit: 1 Visit OT Treatments $Therapeutic Activity: 38-52 mins  [facility], OTR/L [phone]    Lucille Passy M 06/30/2017, 3:08 PM

## 2017-06-30 NOTE — Progress Notes (Signed)
Referring Physician(s): Dr Florencia Reasons  Supervising Physician: Daryll Brod  Patient Status:  [facility] - In-pt  Chief Complaint:  Intra abdominal abscess  Subjective:  Drain placed in IR 10/1 while inpt at [facility]---IR following here OP is yellow color; thin fluid   Allergies: Codeine; Latex; and Penicillins  Medications: Prior to Admission medications   Medication Sig Start Date End Date Taking? Authorizing Provider  amLODipine (NORVASC) 5 MG tablet Take 5 mg by mouth daily.  09/11/13  Yes [provider]  atenolol (TENORMIN) 50 MG tablet Take 25 mg by mouth daily.  08/24/13  Yes [provider]  betamethasone dipropionate (DIPROLENE) 0.05 % cream  04/27/14  Yes [provider]  butalbital-acetaminophen-caffeine (FIORICET, ESGIC) 50-325-40 MG per tablet Take 2 tablets by mouth every 6 (six) hours as needed for headache or migraine.  09/27/13  Yes [provider]  dicyclomine (BENTYL) 10 MG capsule Take 10 mg by mouth 3 (three) times daily before meals.  07/26/13  Yes [provider]  furosemide (LASIX) 20 MG tablet Take 20 mg by mouth daily.  12/17/13  Yes [provider]  gabapentin (NEURONTIN) 300 MG capsule Take 300 mg by mouth 3 (three) times daily.  08/26/13  Yes [provider]  levothyroxine (SYNTHROID, LEVOTHROID) 25 MCG tablet Take 25 mcg by mouth daily.  09/25/13  Yes [provider]  lisinopril (PRINIVIL,ZESTRIL) 10 MG tablet Take 10 mg by mouth daily.  07/22/13  Yes [provider]  omeprazole (PRILOSEC) 40 MG capsule Take 40 mg by mouth daily.  07/26/13  Yes [provider]  simvastatin (ZOCOR) 40 MG tablet Take 40 mg by mouth daily.  07/04/13  Yes [provider]     Vital Signs: BP (!) 144/65 (BP Location: Right Arm)   Pulse 93   Temp 98.6 F (37 C) (Oral)   Resp 15   Ht 5\' 2"  (1.575 m)   Wt 175 lb 8 oz (79.6 kg)   SpO2 99%   BMI 32.10 kg/m    Physical Exam  Abdominal: Soft.  Neurological: She is alert.  Skin: Skin is warm and dry.  Skin site clean and dry NT TD abscess drain intact OP yellow thin fluid 30 cc yesterday  Nursing note and vitals reviewed.   Imaging: Dg Abd Portable 1v  Result Date: 06/28/2017 CLINICAL DATA:  Abdominal pain and nausea. EXAM: PORTABLE ABDOMEN - 1 VIEW COMPARISON:  None. FINDINGS: The bowel gas pattern is normal. No radio-opaque calculi or other significant radiographic abnormality are seen. Pigtail drainage catheter seen in the pelvis. Enteric contrast from prior CT. Degenerative change both hips. IMPRESSION: No obstruction or visible free air. Electronically Signed   By: Staci Righter M.D.   On: 06/28/2017 13:47    Labs:  CBC:  Recent Labs  06/27/17 1803 06/28/17 0423 06/29/17 0500 06/30/17 0942  WBC 27.6* 27.2* 22.4* 20.5*  HGB 10.4* 9.5* 8.8* 8.2*  HCT 30.9* 27.6* 25.9* 25.6*  PLT 196 192 152 279    COAGS:  Recent Labs  06/27/17 1803 06/28/17 0752 06/29/17 1044  INR  --   --  1.14  APTT 155* 70* 49*    BMP:  Recent Labs  06/27/17 1550 06/27/17 1616 06/28/17 0423 06/29/17 0500 06/30/17 0942  NA 133* 138 135 136 138  K 4.3 4.9 4.7 4.4 3.3*  CL 111  --  108 109 110  CO2 19*  --  20* 18* 21*  GLUCOSE 92  --  107* 53* 102*  BUN <5*  --  5* 5* <5*  CALCIUM 6.8*  --  6.7* 6.7* 6.9*  CREATININE 0.71  --  0.74 0.90 0.89  GFRNONAA >60  --  >60 >60 >60  GFRAA >60  --  >60 >60 >60    LIVER FUNCTION TESTS:  Recent Labs  06/27/17 1550 06/29/17 0500  BILITOT 0.4 0.7  AST 44* 42*  ALT 22 20  ALKPHOS 47 51  PROT 4.3* 4.8*  ALBUMIN 1.4* 1.7*    Assessment and Plan:  Intra abd abscess drain intact Will follow   Electronically Signed: Kalya Troeger A, PA-C 06/30/2017, 2:05 PM   I spent a total of 15 Minutes at the the patient's bedside AND on the patient's hospital floor or unit, greater than 50% of which was counseling/coordinating care for Intra  abd abscess drain

## 2017-06-30 NOTE — Progress Notes (Signed)
[facility] Progress Note  3 Days Post-Op  Subjective: CC:  Reports being in a bad mood this morning because vascular surgery is recommending amputation left foot. From and abdominal perspective her pain is controlled, tolerating PO, +flatus. Had a BM 1-2 days ago. Reports therapy got her up to the chair yesterday for 30 minutes.  Objective: Vital signs in last 24 hours: Temp:  [98.2 F (36.8 C)-100.2 F (37.9 C)] 98.2 F (36.8 C) (10/05 0421) Pulse Rate:  [106] 106 (10/04 1209) Resp:  [16] 16 (10/04 1643) BP: (123-136)/(56-70) 136/58 (10/05 0421) SpO2:  [100 %] 100 % (10/04 1209) Last BM Date: 06/29/17  Intake/Output from previous day: 10/04 0701 - 10/05 0700 In: 516 [I.V.:266; IV Piggyback:250] Out: 130 [Drains:130] Intake/Output this shift: No intake/output data recorded.  PE: Gen:  Alert, NAD, pleasant HEENT: Pupils equal and round, anicteric sclerae, EOM's intact Card:  RRR, no M/G/R appreciated Pulm:  CTAB, no W/R/R, effort normal Abd: Soft, mild distension, NT, +BS, open midline incision with undermining of edges and slough at wound edges.  Pelvic drain: 130cc/24hr SS Psych: A&Ox3  Skin: no rashes noted, warm and dry  Lab Results:   Recent Labs  06/28/17 0423 06/29/17 0500  WBC 27.2* 22.4*  HGB 9.5* 8.8*  HCT 27.6* 25.9*  PLT 192 152   BMET  Recent Labs  06/28/17 0423 06/29/17 0500  NA 135 136  K 4.7 4.4  CL 108 109  CO2 20* 18*  GLUCOSE 107* 53*  BUN 5* 5*  CREATININE 0.74 0.90  CALCIUM 6.7* 6.7*   PT/INR  Recent Labs  06/29/17 1044  LABPROT 14.5  INR 1.14   CMP     Component Value Date/Time   NA 136 06/29/2017 0500   K 4.4 06/29/2017 0500   CL 109 06/29/2017 0500   CO2 18 (L) 06/29/2017 0500   GLUCOSE 53 (L) 06/29/2017 0500   BUN 5 (L) 06/29/2017 0500   CREATININE 0.90 06/29/2017 0500   CALCIUM 6.7 (L) 06/29/2017 0500   PROT 4.8 (L) 06/29/2017 0500   ALBUMIN 1.7 (L) 06/29/2017 0500   AST 42 (H) 06/29/2017 0500    ALT 20 06/29/2017 0500   ALKPHOS 51 06/29/2017 0500   BILITOT 0.7 06/29/2017 0500   GFRNONAA >60 06/29/2017 0500   GFRAA >60 06/29/2017 0500   Studies/Results: Dg Abd Portable 1v  Result Date: 06/28/2017 CLINICAL DATA:  Abdominal pain and nausea. EXAM: PORTABLE ABDOMEN - 1 VIEW COMPARISON:  None. FINDINGS: The bowel gas pattern is normal. No radio-opaque calculi or other significant radiographic abnormality are seen. Pigtail drainage catheter seen in the pelvis. Enteric contrast from prior CT. Degenerative change both hips. IMPRESSION: No obstruction or visible free air. Electronically Signed   By: Staci Righter M.D.   On: 06/28/2017 13:47    Anti-infectives: Anti-infectives    Start     Dose/Rate Route Frequency Ordered Stop   06/29/17 2300  fluconazole (DIFLUCAN) IVPB 400 mg     400 mg 100 mL/hr over 120 Minutes Intravenous Every 24 hours 06/28/17 1413     06/29/17 2200  metroNIDAZOLE (FLAGYL) tablet 500 mg     500 mg Oral Every 8 hours 06/29/17 1918     06/29/17 0600  cefTRIAXone (ROCEPHIN) 2 g in dextrose 5 % 50 mL IVPB     2 g 100 mL/hr over 30 Minutes Intravenous Every 24 hours 06/28/17 1413     06/28/17 2300  fluconazole (DIFLUCAN) IVPB 800 mg     800  mg 200 mL/hr over 120 Minutes Intravenous  Once 06/28/17 1413 06/29/17 0148   06/28/17 2200  anidulafungin (ERAXIS) 100 mg in sodium chloride 0.9 % 100 mL IVPB  Status:  Discontinued     100 mg 78 mL/hr over 100 Minutes Intravenous Daily at bedtime 06/27/17 2324 06/28/17 1413   06/28/17 0600  levofloxacin (LEVAQUIN) IVPB 500 mg  Status:  Discontinued     500 mg 100 mL/hr over 60 Minutes Intravenous Every 24 hours 06/28/17 0006 06/28/17 1413   06/28/17 0100  anidulafungin (ERAXIS) 200 mg in sodium chloride 0.9 % 200 mL IVPB     200 mg 78 mL/hr over 200 Minutes Intravenous  Once 06/28/17 0015 06/28/17 0549   06/27/17 2359  metroNIDAZOLE (FLAGYL) IVPB 500 mg  Status:  Discontinued     500 mg 100 mL/hr over 60 Minutes  Intravenous Every 8 hours 06/27/17 2324 06/29/17 1918   06/27/17 2359  levofloxacin (LEVAQUIN) IVPB 500 mg  Status:  Discontinued     500 mg 100 mL/hr over 60 Minutes Intravenous Daily at bedtime 06/27/17 2324 06/28/17 0006   06/27/17 2030  vancomycin (VANCOCIN) IVPB 1000 mg/200 mL premix     1,000 mg 200 mL/hr over 60 Minutes Intravenous Every 12 hours 06/27/17 1741 06/28/17 1110   06/27/17 1345  cefUROXime (ZINACEF) 1.5 g in dextrose 5 % 50 mL IVPB     1.5 g 100 mL/hr over 30 Minutes Intravenous To Surgery 06/27/17 1340 06/27/17 1341       Assessment/Plan Lower limb ischemia - s/p thrombectomy of multiplebilateral vessels and left leg fasciotomy 06/27/17 Dr. Trula Slade. High risk amputation. PAD HTN H/o stroke with residual left sided weakness AKI  SBO - s/p exploratory laparotomy with lysis of adhesions and small bowel perforation repair for [facility] by Dr. Noberto Retort on 06/19/17 - s/p IR placement of transgluteal drain 10/1 for pelvic fluid collection seen on CT scan with only "thin fluid" returned. IR following here. - drain with 130cc/24hr serosanguinous fluid, continue care per IR. - tolerating diet and having BMs  ID - flagyl 10/2>>day#3, rocephin 10/4>>, diflucan 10/4>> VTE - heparin FEN - advance diet to regular as tolerated; Boost Foley - none Follow up - Dr. Noberto Retort  Plan - advance diet as tolerated. Will evaluate wound today with WOC  for possible VAC placement.    LOS: 3 days    Jill Alexanders , [facility] 06/30/2017, 8:14 AM Pager: [phone] Consults: [phone] Mon-Fri 7:00 am-4:30 pm Sat-Sun 7:00 am-11:30 am

## 2017-06-30 NOTE — Progress Notes (Signed)
[place] for Heparin Indication: Leg ischemia   Allergies  Allergen Reactions  . Codeine   . Latex     Blisters  . Penicillins     Patient Measurements: Height: 5\' 2"  (157.5 cm) Weight: 175 lb 8 oz (79.6 kg) IBW/kg (Calculated) : 50.1 Heparin Dosing Weight: 80 kg   Vital Signs: Temp: 98.5 F (36.9 C) (10/05 0816) Temp Source: Oral (10/05 0816) BP: 153/90 (10/05 0816) Pulse Rate: 104 (10/05 0816)  Labs:  Recent Labs  06/27/17 1550  06/27/17 1803 06/28/17 0423 06/28/17 0752  06/29/17 0500 06/29/17 1044 06/29/17 2310 06/30/17 0743  HGB  --   < > 10.4* 9.5*  --   --  8.8*  --   --   --   HCT  --   < > 30.9* 27.6*  --   --  25.9*  --   --   --   PLT  --   --  196 192  --   --  152  --   --   --   APTT  --   --  155*  --  70*  --   --  49*  --   --   LABPROT  --   --   --   --   --   --   --  14.5  --   --   INR  --   --   --   --   --   --   --  1.14  --   --   HEPARINUNFRC  --   --   --   --   --   < >  --  0.29* 0.36 0.54  CREATININE 0.71  --   --  0.74  --   --  0.90  --   --   --   < > = values in this interval not displayed.  Estimated Creatinine Clearance: 58.5 mL/min (by C-G formula based on SCr of 0.9 mg/dL).  Assessment: 46 yof s/p ex lap with lysis of adhesions and small bowel perforation repair on 9/24, admitted with ischemic left limb. He is now s/p bilateral femoral popliteal embolectomy and left leg fasciotomy on 10/2.  Heparin level of 0.54 is slightly higher than goal range of 0.3-0.5. CBC pending. To require L BKA per vascular.   Goal of Therapy:  Heparin level 0.3-0.5 units/ml Monitor platelets by anticoagulation protocol: Yes   Plan:  -Decrease heparin infusion slightly to 1000 units/hr -Daily heparin level -Monitor CBC and s/s of bleeding   Vincenza Hews, PharmD, BCPS 06/30/2017, 10:27 AM

## 2017-06-30 NOTE — Progress Notes (Signed)
Subjective  - POD #3  says she feels better   Physical Exam:  Left foot with doppler signals down to the ankle Blistering to lower leg Foot is cold and mottled       Assessment/Plan:  POD #3  Discussed with patient that her foot is not viable.  She is going to require a below knee amputation.  She is having a hard time processing this and needs time to contemplate what she wants do.  Anna Hayes 06/30/2017 8:08 AM --  Vitals:   06/30/17 0005 06/30/17 0421  BP: (!) 123/56 (!) 136/58  Pulse:    Resp:    Temp: 98.3 F (36.8 C) 98.2 F (36.8 C)  SpO2:      Intake/Output Summary (Last 24 hours) at 06/30/17 0808 Last data filed at 06/30/17 0651  Gross per 24 hour  Intake           516.03 ml  Output              130 ml  Net           386.03 ml     Laboratory CBC    Component Value Date/Time   WBC 22.4 (H) 06/29/2017 0500   HGB 8.8 (L) 06/29/2017 0500   HCT 25.9 (L) 06/29/2017 0500   PLT 152 06/29/2017 0500    BMET    Component Value Date/Time   NA 136 06/29/2017 0500   K 4.4 06/29/2017 0500   CL 109 06/29/2017 0500   CO2 18 (L) 06/29/2017 0500   GLUCOSE 53 (L) 06/29/2017 0500   BUN 5 (L) 06/29/2017 0500   CREATININE 0.90 06/29/2017 0500   CALCIUM 6.7 (L) 06/29/2017 0500   GFRNONAA >60 06/29/2017 0500   GFRAA >60 06/29/2017 0500    COAG Lab Results  Component Value Date   INR 1.14 06/29/2017   No results found for: PTT  Antibiotics Anti-infectives    Start     Dose/Rate Route Frequency Ordered Stop   06/29/17 2300  fluconazole (DIFLUCAN) IVPB 400 mg     400 mg 100 mL/hr over 120 Minutes Intravenous Every 24 hours 06/28/17 1413     06/29/17 2200  metroNIDAZOLE (FLAGYL) tablet 500 mg     500 mg Oral Every 8 hours 06/29/17 1918     06/29/17 0600  cefTRIAXone (ROCEPHIN) 2 g in dextrose 5 % 50 mL IVPB     2 g 100 mL/hr over 30 Minutes Intravenous Every 24 hours 06/28/17 1413     06/28/17 2300  fluconazole (DIFLUCAN) IVPB 800 mg     800 mg 200 mL/hr over 120 Minutes Intravenous  Once 06/28/17 1413 06/29/17 0148   06/28/17 2200  anidulafungin (ERAXIS) 100 mg in sodium chloride 0.9 % 100 mL IVPB  Status:  Discontinued     100 mg 78 mL/hr over 100 Minutes Intravenous Daily at bedtime 06/27/17 2324 06/28/17 1413   06/28/17 0600  levofloxacin (LEVAQUIN) IVPB 500 mg  Status:  Discontinued     500 mg 100 mL/hr over 60 Minutes Intravenous Every 24 hours 06/28/17 0006 06/28/17 1413   06/28/17 0100  anidulafungin (ERAXIS) 200 mg in sodium chloride 0.9 % 200 mL IVPB     200 mg 78 mL/hr over 200 Minutes Intravenous  Once 06/28/17 0015 06/28/17 0549   06/27/17 2359  metroNIDAZOLE (FLAGYL) IVPB 500 mg  Status:  Discontinued     500 mg 100 mL/hr over 60 Minutes Intravenous Every 8 hours 06/27/17 2324 06/29/17  1918   06/27/17 2359  levofloxacin (LEVAQUIN) IVPB 500 mg  Status:  Discontinued     500 mg 100 mL/hr over 60 Minutes Intravenous Daily at bedtime 06/27/17 2324 06/28/17 0006   06/27/17 2030  vancomycin (VANCOCIN) IVPB 1000 mg/200 mL premix     1,000 mg 200 mL/hr over 60 Minutes Intravenous Every 12 hours 06/27/17 1741 06/28/17 1110   06/27/17 1345  cefUROXime (ZINACEF) 1.5 g in dextrose 5 % 50 mL IVPB     1.5 g 100 mL/hr over 30 Minutes Intravenous To Surgery 06/27/17 1340 06/27/17 1341       V. Leia Alf, M.D. [facility] Office: [phone] Pager:  [phone]

## 2017-06-30 NOTE — Progress Notes (Addendum)
PROGRESS NOTE  Anna Hayes TKZ:[id number] DOB: 09-23-1951 DOA: 06/27/2017 PCP: Rochel Brome, MD  Brief Narrative: From discharge summary: 66 year old woman presented with abdominal pain and was admitted to [facility] 9/22. Found to have small bowel obstruction. Underwent expiratory laparotomy with lysis of adhesions and release of small bowel obstruction, repair of small bowel perforation. Subsequently had fevers, leukocytosis and CT abdomen and pelvis 9/29 revealed pelvic abscess and underwent successful placement of percutaneous drain October 1. Also noted to have fungemia on 1/2 blood culture 9/27 and started on antifungal therapy. She subsequently developed left lower stomach pain and discomfort October 1 and underwent CT angiogram which revealed bilateral arterial blockage both lower extremities, was started on heparin and emergently transferred to [facility] where she underwent thrombectomy and fasciotomy October 2.  Assessment/Plan Critical left leg ischemia, status post bilateral femoral embolectomy, angioplasty left posterior tibial artery, fasciotomy left leg 10/2. -Vascular surgery following, unfortunately left foot not felt to be viable and amputation has been recommended.   Status post laparotomy, lysis of adhesions and repair of perforated bowel 9/24 at [place]. -General surgery managing. Management per general surgery. Wound VAC placed today.  Abdominal abscess, status post percutaneous drain placement October 1. On treatment with Levaquin and Flagyl since 9/27. -Continue antibiotics. Appreciate infectious disease recommendations and interventional radiology management. Repeat CT when drainage <10-15 mL per day -WBC trending down  Fungemia Candida tropicalis, started on caspofungin 10/1 -changed to Diflucan per infectious disease to complete total 2 weeks.  -will need formal outpatient ophthalmologist dilated funduscopic exam to exclude fungal  endolpthalmitis  Hypoalbuminemia. Dietician consult.  Microcytic anemia, likely multifactorial including perioperative blood loss. - No significant change today. Repeat CBC in a.m.  Status post AKI  PMH stroke with left upper and left lower extremity weakness, left facial droop.  DVT prophylaxis: heparin infusion  Code Status: full Family Communication: none Disposition Plan: CIR?   Murray Hodgkins, MD  [facility] Direct contact: [phone] --Via amion app OR  --[email or web address]; password [email or web address]  7PM-7AM contact night coverage as above 06/30/2017, 2:22 PM  LOS: 3 days   Consultants:  Vascular surgery  General surgery  ID  IR  PMR  Procedures:  Exploratory laparotomy 9/24 with lysis of adhesions and release of small bowel obstruction, repair of small bowel perforation.  Percutaneous drain placement right buttock for pelvic abscess October 1.   10/2:   BILATERAL FEMORAL POPLITEAL EMBOLECTOMY (Bilateral)  PATCH ANGIOPLASTY Left Posterior Tibial Artery (Left)  FULL COMPARTMENT LEFT LOWER LEG FASCIOTOMY (Left)  Antimicrobials: Anidulafungin 10/2 >>10/3 Fluconazole 10/3 >> 10/17 Levaquin 10/2 >> Metronidazole 10/2 >>  Interval history/Subjective: Feels okay. Has abdominal pain and left leg pain with movement.  Vascular surgeon has recommended amputation. General surgery following abdominal wound, wound VAC placed today. Transgluteal cutaneous drain continues to have significant drainage. Interventional radiology following.  Objective: Vitals: Afebrile, 98.6, 13, 103, 144/65, 99% on room air  Exam:    Constitutional:  Appears calm and comfortable ENMT:  grossly normal hearing Respiratory:  CTA bilaterally, no w/r/r.  Respiratory effort normal.  Cardiovascular:  RRR, no m/r/g, left lower extremity edema noted Musculoskeletal:  Able to move both legs. Right leg stronger than left. Decreased tone left leg. Skin:  Left foot is darker today. No  nodules noted Psychiatric:  Mental status Mood, affect appropriate Judgment and insight appear grossly normal.  I have personally reviewed the following:   Labs:  Potassium 3.3, remainder BMP unremarkable  R. Corrected calcium 8.7.[place] and summation of  old records:  Discharge summary: 66 year old woman presented with abdominal pain and was admitted to [facility] 9/22. Found to have small bowel obstruction. Underwent expiratory laparotomy with lysis of adhesions and release of small bowel obstruction, repair of small bowel perforation. Subsequently had fevers, leukocytosis and CT abdomen and pelvis 9/29 revealed pelvic abscess and underwent successful placement of percutaneous drain October 1. Also noted to have fungemia on 1/2 blood culture 9/27 and started on antifungal therapy. She subsequently developed left lower stomach pain and discomfort October 1 and underwent CT angiogram which revealed bilateral arterial blockage both lower extremities, was started on heparin and emergently transferred to [facility].  Operative report 9/24: Small bowel obstruction. Exploratory laparotomy, lysis of adhesions with release of small bowel obstruction, repair small bowel perforation.  CT abdomen and pelvis 9/29: Bilocular fluid collection in the pelvis. 0.4 x 8.4 x 3.7.  10/1 CT-guided right transgluteal drain placement.  Scheduled Meds: . amLODipine  5 mg Oral Daily  . docusate sodium  100 mg Oral Daily  . feeding supplement  1 Container Oral TID BM  . gabapentin  300 mg Oral BID  . Influenza vac split quadrivalent PF  0.5 mL Intramuscular Tomorrow-1000  . lisinopril  10 mg Oral Daily  . metroNIDAZOLE  500 mg Oral Q8H  . pantoprazole  40 mg Oral Daily  . simvastatin  20 mg Oral q1800   Continuous Infusions: . sodium chloride    . sodium chloride    . cefTRIAXone (ROCEPHIN)  IV Stopped (06/30/17 0602)  . fluconazole (DIFLUCAN) IV Stopped (06/30/17 0115)  . heparin 1,000 Units/hr  (06/30/17 1026)    Principal Problem:   Critical lower limb ischemia Active Problems:   PAD (peripheral artery disease) (HCC)   Stroke (HCC)   SBO (small bowel obstruction) (HCC)   Intraabdominal fluid collection absess   Fungemia   Abdominal pain   History of CVA with residual deficit   Tobacco abuse   Acute blood loss anemia   Post-operative pain   Leukocytosis   LOS: 3 days

## 2017-07-01 LAB — CULTURE, BLOOD (ROUTINE X 2): SPECIAL REQUESTS: ADEQUATE

## 2017-07-01 LAB — BASIC METABOLIC PANEL
ANION GAP: 9 (ref 5–15)
BUN: <5 mg/dL — ABNORMAL LOW (ref 6–20)
CHLORIDE: 106 mmol/L (ref 101–111)
CO2: 21 mmol/L — ABNORMAL LOW (ref 22–32)
Calcium: 7 mg/dL — ABNORMAL LOW (ref 8.9–10.3)
Creatinine, Ser: 0.81 mg/dL (ref 0.44–1.00)
Glucose, Bld: 91 mg/dL (ref 65–99)
POTASSIUM: 4.1 mmol/L (ref 3.5–5.1)
SODIUM: 136 mmol/L (ref 135–145)

## 2017-07-01 LAB — MAGNESIUM: Magnesium: 1.7 mg/dL (ref 1.7–2.4)

## 2017-07-01 LAB — HEPATIC FUNCTION PANEL
ALK PHOS: 47 U/L (ref 38–126)
ALT: 17 U/L (ref 14–54)
AST: 32 U/L (ref 15–41)
Albumin: 1.6 g/dL — ABNORMAL LOW (ref 3.5–5.0)
BILIRUBIN DIRECT: 0.2 mg/dL (ref 0.1–0.5)
BILIRUBIN INDIRECT: 0.4 mg/dL (ref 0.3–0.9)
Total Bilirubin: 0.6 mg/dL (ref 0.3–1.2)
Total Protein: 5.2 g/dL — ABNORMAL LOW (ref 6.5–8.1)

## 2017-07-01 LAB — CBC
HEMATOCRIT: 24.2 % — AB (ref 36.0–46.0)
HEMOGLOBIN: 7.8 g/dL — AB (ref 12.0–15.0)
MCH: 24.9 pg — ABNORMAL LOW (ref 26.0–34.0)
MCHC: 32.2 g/dL (ref 30.0–36.0)
MCV: 77.3 fL — AB (ref 78.0–100.0)
Platelets: 274 10*3/uL (ref 150–400)
RBC: 3.13 MIL/uL — AB (ref 3.87–5.11)
RDW: 19.5 % — AB (ref 11.5–15.5)
WBC: 20 10*3/uL — AB (ref 4.0–10.5)

## 2017-07-01 LAB — HEPARIN LEVEL (UNFRACTIONATED): Heparin Unfractionated: 0.28 IU/mL — ABNORMAL LOW (ref 0.30–0.70)

## 2017-07-01 NOTE — Progress Notes (Signed)
[place] for Heparin Indication: Leg ischemia   Allergies  Allergen Reactions  . Codeine   . Latex     Blisters  . Penicillins     Patient Measurements: Height: 5\' 2"  (157.5 cm) Weight: 175 lb (79.4 kg) IBW/kg (Calculated) : 50.1 Heparin Dosing Weight: 80 kg   Vital Signs: Temp: 98.9 F (37.2 C) (10/06 0526) Temp Source: Oral (10/06 0526) BP: 141/63 (10/06 0526) Pulse Rate: 99 (10/06 0526)  Labs:  Recent Labs  06/29/17 0500 06/29/17 1044 06/29/17 2310 06/30/17 0743 06/30/17 0942 07/01/17 0621  HGB 8.8*  --   --   --  8.2* 7.8*  HCT 25.9*  --   --   --  25.6* 24.2*  PLT 152  --   --   --  279 274  APTT  --  49*  --   --   --   --   LABPROT  --  14.5  --   --   --   --   INR  --  1.14  --   --   --   --   HEPARINUNFRC  --  0.29* 0.36 0.54  --  0.28*  CREATININE 0.90  --   --   --  0.89 0.81    Estimated Creatinine Clearance: 64.9 mL/min (by C-G formula based on SCr of 0.81 mg/dL).  Assessment: 63 yof s/p ex lap with lysis of adhesions and small bowel perforation repair on 9/24, admitted with ischemic left limb. He is now s/p bilateral femoral popliteal embolectomy and left leg fasciotomy on 10/2.  Heparin level 0.28 slightly subtherapeutic. CBC low-stable s/p transfusion 10/2. To require L BKA per vascular and ID.   Goal of Therapy:  Heparin level 0.3-0.5 units/ml Monitor platelets by anticoagulation protocol: Yes   Plan:  -Increase heparin gtt to 1050 units/hr -Daily heparin level -Monitor CBC and s/s of bleeding   Bertis Ruddy, PharmD Pharmacy Resident Pager #: [phone] 07/01/2017 8:53 AM

## 2017-07-01 NOTE — Progress Notes (Signed)
Referring Physician(s): Goodrich,D  Supervising Physician: Jacqulynn Cadet  Patient Status:  [facility] - In-pt  Chief Complaint:  Pelvic fluid collection  Subjective: Pt has some soreness at rt TG drain site; denies N/V, resp difficulties, or fever   Allergies: Codeine; Latex; and Penicillins  Medications: Prior to Admission medications   Medication Sig Start Date End Date Taking? Authorizing Provider  amLODipine (NORVASC) 5 MG tablet Take 5 mg by mouth daily.  09/11/13  Yes [provider]  atenolol (TENORMIN) 50 MG tablet Take 25 mg by mouth daily.  08/24/13  Yes [provider]  betamethasone dipropionate (DIPROLENE) 0.05 % cream  04/27/14  Yes [provider]  butalbital-acetaminophen-caffeine (FIORICET, ESGIC) 50-325-40 MG per tablet Take 2 tablets by mouth every 6 (six) hours as needed for headache or migraine.  09/27/13  Yes [provider]  dicyclomine (BENTYL) 10 MG capsule Take 10 mg by mouth 3 (three) times daily before meals.  07/26/13  Yes [provider]  furosemide (LASIX) 20 MG tablet Take 20 mg by mouth daily.  12/17/13  Yes [provider]  gabapentin (NEURONTIN) 300 MG capsule Take 300 mg by mouth 3 (three) times daily.  08/26/13  Yes [provider]  levothyroxine (SYNTHROID, LEVOTHROID) 25 MCG tablet Take 25 mcg by mouth daily.  09/25/13  Yes [provider]  lisinopril (PRINIVIL,ZESTRIL) 10 MG tablet Take 10 mg by mouth daily.  07/22/13  Yes [provider]  omeprazole (PRILOSEC) 40 MG capsule Take 40 mg by mouth daily.  07/26/13  Yes [provider]  simvastatin (ZOCOR) 40 MG tablet Take 40 mg by mouth daily.  07/04/13  Yes [provider]     Vital Signs: BP (!) 142/69   Pulse 95   Temp 98.9 F (37.2 C) (Oral)   Resp 15   Ht 5\' 2"  (1.575 m)   Wt 175 lb (79.4 kg)   SpO2 100%   BMI 32.01 kg/m   Physical Exam awake/alert; rt TG drain intact, insertion site  mild-mod tender, very little output in bag light yellow serous fluid  Imaging: Dg Abd Portable 1v  Result Date: 06/28/2017 CLINICAL DATA:  Abdominal pain and nausea. EXAM: PORTABLE ABDOMEN - 1 VIEW COMPARISON:  None. FINDINGS: The bowel gas pattern is normal. No radio-opaque calculi or other significant radiographic abnormality are seen. Pigtail drainage catheter seen in the pelvis. Enteric contrast from prior CT. Degenerative change both hips. IMPRESSION: No obstruction or visible free air. Electronically Signed   By: Staci Righter M.D.   On: 06/28/2017 13:47    Labs:  CBC:  Recent Labs  06/28/17 0423 06/29/17 0500 06/30/17 0942 07/01/17 0621  WBC 27.2* 22.4* 20.5* 20.0*  HGB 9.5* 8.8* 8.2* 7.8*  HCT 27.6* 25.9* 25.6* 24.2*  PLT 192 152 279 274    COAGS:  Recent Labs  06/27/17 1803 06/28/17 0752 06/29/17 1044  INR  --   --  1.14  APTT 155* 70* 49*    BMP:  Recent Labs  06/28/17 0423 06/29/17 0500 06/30/17 0942 07/01/17 0621  NA 135 136 138 136  K 4.7 4.4 3.3* 4.1  CL 108 109 110 106  CO2 20* 18* 21* 21*  GLUCOSE 107* 53* 102* 91  BUN 5* 5* <5* <5*  CALCIUM 6.7* 6.7* 6.9* 7.0*  CREATININE 0.74 0.90 0.89 0.81  GFRNONAA >60 >60 >60 >60  GFRAA >60 >60 >60 >60    LIVER FUNCTION TESTS:  Recent Labs  06/27/17 1550 06/29/17 0500  07/01/17 0621  BILITOT 0.4 0.7 0.6  AST 44* 42* 32  ALT 22 20 17   ALKPHOS 47 51 47  PROT 4.3* 4.8* 5.2*  ALBUMIN 1.4* 1.7* 1.6*    Assessment and Plan: Pt with pelvic fluid collection post SBR ; s/p pelvic  drain placement at [facility] 10/1; afebrile; WBC 20(20.5), hgb 7.8, creat nl; rec f/u CT on 10/8 to reassess fluid collection   Electronically Signed: D. Rowe Robert, PA-C 07/01/2017, 11:31 AM   I spent a total of 15 minutes at the the patient's bedside AND on the patient's hospital floor or unit, greater than 50% of which was counseling/coordinating care for pelvic fluid collection    Patient ID: Anna Hayes, female   DOB: [birth year], 66 y.o.   MRN: [id number]

## 2017-07-01 NOTE — Progress Notes (Signed)
Subjective: Interval History: none..   Objective: Vital signs in last 24 hours: Temp:  [98.8 F (37.1 C)-99.2 F (37.3 C)] 99 F (37.2 C) (10/06 1200) Pulse Rate:  [92-99] 92 (10/06 1200) Resp:  [15-18] 17 (10/06 1200) BP: (106-150)/(55-69) 150/66 (10/06 1200) SpO2:  [99 %-100 %] 100 % (10/06 0800) Weight:  [175 lb (79.4 kg)] 175 lb (79.4 kg) (10/06 0526)  Intake/Output from previous day: 10/05 0701 - 10/06 0700 In: 118 [P.O.:118] Out: 1300 [Urine:1300] Intake/Output this shift: Total I/O In: -  Out: 7 [Drains:7]  VAC dressing over her left calf wounds. Left foot nonviable cold with the epidermal lysis. Palpable dorsalis pedis pulse on the right  Lab Results:  Recent Labs  06/30/17 0942 07/01/17 0621  WBC 20.5* 20.0*  HGB 8.2* 7.8*  HCT 25.6* 24.2*  PLT 279 274   BMET  Recent Labs  06/30/17 0942 07/01/17 0621  NA 138 136  K 3.3* 4.1  CL 110 106  CO2 21* 21*  GLUCOSE 102* 91  BUN <5* <5*  CREATININE 0.89 0.81  CALCIUM 6.9* 7.0*    Studies/Results: Dg Abd Portable 1v  Result Date: 06/28/2017 CLINICAL DATA:  Abdominal pain and nausea. EXAM: PORTABLE ABDOMEN - 1 VIEW COMPARISON:  None. FINDINGS: The bowel gas pattern is normal. No radio-opaque calculi or other significant radiographic abnormality are seen. Pigtail drainage catheter seen in the pelvis. Enteric contrast from prior CT. Degenerative change both hips. IMPRESSION: No obstruction or visible free air. Electronically Signed   By: Staci Righter M.D.   On: 06/28/2017 13:47   Anti-infectives: Anti-infectives    Start     Dose/Rate Route Frequency Ordered Stop   06/29/17 2300  fluconazole (DIFLUCAN) IVPB 400 mg     400 mg 100 mL/hr over 120 Minutes Intravenous Every 24 hours 06/28/17 1413     06/29/17 2200  metroNIDAZOLE (FLAGYL) tablet 500 mg     500 mg Oral Every 8 hours 06/29/17 1918     06/29/17 0600  cefTRIAXone (ROCEPHIN) 2 g in dextrose 5 % 50 mL IVPB     2 g 100 mL/hr over 30 Minutes  Intravenous Every 24 hours 06/28/17 1413     06/28/17 2300  fluconazole (DIFLUCAN) IVPB 800 mg     800 mg 200 mL/hr over 120 Minutes Intravenous  Once 06/28/17 1413 06/29/17 0148   06/28/17 2200  anidulafungin (ERAXIS) 100 mg in sodium chloride 0.9 % 100 mL IVPB  Status:  Discontinued     100 mg 78 mL/hr over 100 Minutes Intravenous Daily at bedtime 06/27/17 2324 06/28/17 1413   06/28/17 0600  levofloxacin (LEVAQUIN) IVPB 500 mg  Status:  Discontinued     500 mg 100 mL/hr over 60 Minutes Intravenous Every 24 hours 06/28/17 0006 06/28/17 1413   06/28/17 0100  anidulafungin (ERAXIS) 200 mg in sodium chloride 0.9 % 200 mL IVPB     200 mg 78 mL/hr over 200 Minutes Intravenous  Once 06/28/17 0015 06/28/17 0549   06/27/17 2359  metroNIDAZOLE (FLAGYL) IVPB 500 mg  Status:  Discontinued     500 mg 100 mL/hr over 60 Minutes Intravenous Every 8 hours 06/27/17 2324 06/29/17 1918   06/27/17 2359  levofloxacin (LEVAQUIN) IVPB 500 mg  Status:  Discontinued     500 mg 100 mL/hr over 60 Minutes Intravenous Daily at bedtime 06/27/17 2324 06/28/17 0006   06/27/17 2030  vancomycin (VANCOCIN) IVPB 1000 mg/200 mL premix     1,000 mg 200 mL/hr over 60 Minutes  Intravenous Every 12 hours 06/27/17 1741 06/28/17 1110   06/27/17 1345  cefUROXime (ZINACEF) 1.5 g in dextrose 5 % 50 mL IVPB     1.5 g 100 mL/hr over 30 Minutes Intravenous To Surgery 06/27/17 1340 06/27/17 1341      Assessment/Plan: s/p Procedure(s): BILATERAL FEMORAL POPLITEAL EMBOLECTOMY (Bilateral) PATCH ANGIOPLASTY Left Posterior Tibial Artery (Left) FULL COMPARTMENT LEFT LOWER LEG FASCIOTOMY (Left) Again discussed need for amputation on the left. With her extensive calf wounds may be difficult to heal below-knee amputation. He currently is not toxic from this and is not having a great deal of pain in her foot since it is insensate. She reports that she needs time to "process". Following along. Will need amputation Dayrin Stallone in the week   LOS: 4  days   Anna Hayes 07/01/2017, 1:42 PM

## 2017-07-01 NOTE — Progress Notes (Signed)
[facility] Progress Note 4 Days Post-Op  Subjective: Patient dong very well.  No acute distress  Objective: Vital signs in last 24 hours: Temp:  [98.6 F (37 C)-99.2 F (37.3 C)] 98.9 F (37.2 C) (10/06 0526) Pulse Rate:  [93-99] 99 (10/06 0526) Resp:  [15-18] 18 (10/06 0526) BP: (106-144)/(55-66) 141/63 (10/06 0526) SpO2:  [99 %-100 %] 100 % (10/06 0526) Weight:  [79.4 kg (175 lb)] 79.4 kg (175 lb) (10/06 0526) Last BM Date: 06/30/17  Intake/Output from previous day: 10/05 0701 - 10/06 0700 In: 118 [P.O.:118] Out: 1300 [Urine:1300] Intake/Output this shift: No intake/output data recorded.  General: No acute distress  Lungs: Clear  Abd: Soft, great bowel sounds.  Not tender.  Drain in the right hip area is draining only serous fluid.  It does not appear as though drain fluid was cultured.  Extremities: No changes.  Vascular surgery is following the patient  Neuro: Intact  Lab Results:  @LABLAST2 (wbc:2,hgb:2,hct:2,plt:2) BMET ) Recent Labs  06/30/17 0942 07/01/17 0621  NA 138 136  K 3.3* 4.1  CL 110 106  CO2 21* 21*  GLUCOSE 102* 91  BUN <5* <5*  CREATININE 0.89 0.81  CALCIUM 6.9* 7.0*   PT/INR  Recent Labs  06/29/17 1044  LABPROT 14.5  INR 1.14   ABG No results for input(s): PHART, HCO3 in the last 72 hours.  Invalid input(s): PCO2, PO2  Studies/Results: No results found.  Anti-infectives: Anti-infectives    Start     Dose/Rate Route Frequency Ordered Stop   06/29/17 2300  fluconazole (DIFLUCAN) IVPB 400 mg     400 mg 100 mL/hr over 120 Minutes Intravenous Every 24 hours 06/28/17 1413     06/29/17 2200  metroNIDAZOLE (FLAGYL) tablet 500 mg     500 mg Oral Every 8 hours 06/29/17 1918     06/29/17 0600  cefTRIAXone (ROCEPHIN) 2 g in dextrose 5 % 50 mL IVPB     2 g 100 mL/hr over 30 Minutes Intravenous Every 24 hours 06/28/17 1413     06/28/17 2300  fluconazole (DIFLUCAN) IVPB 800 mg     800 mg 200 mL/hr over 120 Minutes Intravenous  Once  06/28/17 1413 06/29/17 0148   06/28/17 2200  anidulafungin (ERAXIS) 100 mg in sodium chloride 0.9 % 100 mL IVPB  Status:  Discontinued     100 mg 78 mL/hr over 100 Minutes Intravenous Daily at bedtime 06/27/17 2324 06/28/17 1413   06/28/17 0600  levofloxacin (LEVAQUIN) IVPB 500 mg  Status:  Discontinued     500 mg 100 mL/hr over 60 Minutes Intravenous Every 24 hours 06/28/17 0006 06/28/17 1413   06/28/17 0100  anidulafungin (ERAXIS) 200 mg in sodium chloride 0.9 % 200 mL IVPB     200 mg 78 mL/hr over 200 Minutes Intravenous  Once 06/28/17 0015 06/28/17 0549   06/27/17 2359  metroNIDAZOLE (FLAGYL) IVPB 500 mg  Status:  Discontinued     500 mg 100 mL/hr over 60 Minutes Intravenous Every 8 hours 06/27/17 2324 06/29/17 1918   06/27/17 2359  levofloxacin (LEVAQUIN) IVPB 500 mg  Status:  Discontinued     500 mg 100 mL/hr over 60 Minutes Intravenous Daily at bedtime 06/27/17 2324 06/28/17 0006   06/27/17 2030  vancomycin (VANCOCIN) IVPB 1000 mg/200 mL premix     1,000 mg 200 mL/hr over 60 Minutes Intravenous Every 12 hours 06/27/17 1741 06/28/17 1110   06/27/17 1345  cefUROXime (ZINACEF) 1.5 g in dextrose 5 % 50 mL IVPB  1.5 g 100 mL/hr over 30 Minutes Intravenous To Surgery 06/27/17 1340 06/27/17 1341      Assessment/Plan: s/p Procedure(s): BILATERAL FEMORAL POPLITEAL EMBOLECTOMY PATCH ANGIOPLASTY Left Posterior Tibial Artery FULL COMPARTMENT LEFT LOWER LEG FASCIOTOMY Pelvic abscess after SBR at [facility] with pelvic fluid collectiion, likely abscess.  Cultures not present.  IR to evaluate for when repeat CT is to be done.  WBC still 20K, but the patient is afebrile.  Benign clinically.  LOS: 4 days   Kathryne Eriksson. Dahlia Bailiff, MD, FACS [phone] [phone] [facility] 07/01/2017

## 2017-07-01 NOTE — Progress Notes (Addendum)
PROGRESS NOTE  Anna Hayes URK:[id number] DOB: 1951/05/21 DOA: 06/27/2017 PCP: Rochel Brome, MD  Brief Narrative: From discharge summary: 66 year old woman presented with abdominal pain and was admitted to [facility] 9/22. Found to have small bowel obstruction. Underwent expiratory laparotomy with lysis of adhesions and release of small bowel obstruction, repair of small bowel perforation. Subsequently had fevers, leukocytosis and CT abdomen and pelvis 9/29 revealed pelvic abscess and underwent successful placement of percutaneous drain October 1. Also noted to have fungemia on 1/2 blood culture 9/27 and started on antifungal therapy. She subsequently developed left lower stomach pain and discomfort October 1 and underwent CT angiogram which revealed bilateral arterial blockage both lower extremities, was started on heparin and emergently transferred to [facility] where she underwent thrombectomy and fasciotomy October 2.  Assessment/Plan Critical left leg ischemia, status post bilateral femoral embolectomy, angioplasty left posterior tibial artery, fasciotomy left leg 10/2. -management per VVS. Left foot felt to be nonviable. Left BKA recommended.  Status post laparotomy, lysis of adhesions and repair of perforated bowel 9/24 at [facility]. -management per general surgery. Wound VAC per general surgery.  Abdominal abscess, status post percutaneous drain placement October 1. On treatment with Levaquin and Flagyl since 9/27. -WBC without significant change today -Continue abx per ID.  -IR plans for repeat CT to assess abscess 10/8.    Fungemia Candida tropicalis, started on caspofungin 10/1 -continue fluconazole per ID to complete total 2 weeks.  -will need formal outpatient ophthalmologist dilated funduscopic exam to exclude fungal endolpthalmitis  Hypoalbuminemia.  -follow clinically. Full liquid diet per general surgery  Microcytic anemia, likely multifactorial including  perioperative blood loss. S/p 2 units PRBC 10/2. -Hgb slightly lower today. Recheck CBC in AM  Obesity unspecified  Status post AKI  PMH stroke with left upper and left lower extremity weakness, left facial droop.  DVT prophylaxis: heparin infusion  Code Status: full Family Communication: none Disposition Plan: CIR?   Murray Hodgkins, MD  [facility] Direct contact: [phone] --Via amion app OR  --[email or web address]; password [email or web address]  7PM-7AM contact night coverage as above 07/01/2017, 12:42 PM  LOS: 4 days   Consultants:  Vascular surgery  General surgery  ID  IR  PMR  Procedures:  Exploratory laparotomy 9/24 with lysis of adhesions and release of small bowel obstruction, repair of small bowel perforation.  Percutaneous drain placement right buttock for pelvic abscess October 1.   10/2 transfusion 2 units PRBC 10/2:   BILATERAL FEMORAL POPLITEAL EMBOLECTOMY (Bilateral)  PATCH ANGIOPLASTY Left Posterior Tibial Artery (Left)  FULL COMPARTMENT LEFT LOWER LEG FASCIOTOMY (Left)  Antimicrobials: Anidulafungin 10/2 >>10/3 Fluconazole 10/3 >> 10/17 Levaquin 10/2 >> Metronidazole 10/2 >>  Interval history/Subjective: Feels ok.  Objective: Vitals: afebrile, VSS, 99.0, 17, 92, 150/66  Exam:    Constitutional:  Appears calm, mildly uncomfortable ENMT:  grossly normal hearing  Lips appear unremarkable Respiratory:  CTA bilaterally, no w/r/r.  Respiratory effort normal.  Cardiovascular:  RRR, no m/r/g 1+ LLE extremity edema   Abdomen:  Soft, nondistended Musculoskeletal:  RLE moves to command Skin:  Left foot is dark, cool to touch. Blistering noted to foot and distal left leg.   Psychiatric:  judgement and insight appear normal Mental status Mood, affect appropriate  I have personally reviewed the following:   Labs:  CMP notable for albumin 1.6  Mg WNL  Hgb trending down, 8.2 >> 7.8  WBC without change 20.5 >> 20.0  R. Corrected  calcium 8.7.eview and summation of old records:  Discharge  summary: 66 year old woman presented with abdominal pain and was admitted to [facility] 9/22. Found to have small bowel obstruction. Underwent expiratory laparotomy with lysis of adhesions and release of small bowel obstruction, repair of small bowel perforation. Subsequently had fevers, leukocytosis and CT abdomen and pelvis 9/29 revealed pelvic abscess and underwent successful placement of percutaneous drain October 1. Also noted to have fungemia on 1/2 blood culture 9/27 and started on antifungal therapy. She subsequently developed left lower stomach pain and discomfort October 1 and underwent CT angiogram which revealed bilateral arterial blockage both lower extremities, was started on heparin and emergently transferred to [facility].  Operative report 9/24: Small bowel obstruction. Exploratory laparotomy, lysis of adhesions with release of small bowel obstruction, repair small bowel perforation.  CT abdomen and pelvis 9/29: Bilocular fluid collection in the pelvis. 0.4 x 8.4 x 3.7.  10/1 CT-guided right transgluteal drain placement.  Scheduled Meds: . amLODipine  5 mg Oral Daily  . docusate sodium  100 mg Oral Daily  . feeding supplement (ENSURE ENLIVE)  237 mL Oral BID BM  . gabapentin  300 mg Oral BID  . Influenza vac split quadrivalent PF  0.5 mL Intramuscular Tomorrow-1000  . lisinopril  10 mg Oral Daily  . metroNIDAZOLE  500 mg Oral Q8H  . multivitamin with minerals  1 tablet Oral Daily  . pantoprazole  40 mg Oral Daily  . simvastatin  20 mg Oral q1800   Continuous Infusions: . sodium chloride    . sodium chloride    . cefTRIAXone (ROCEPHIN)  IV Stopped (07/01/17 0557)  . fluconazole (DIFLUCAN) IV Stopped (07/01/17 0024)  . heparin 1,050 Units/hr (07/01/17 0854)    Principal Problem:   Critical lower limb ischemia Active Problems:   PAD (peripheral artery disease) (HCC)   Stroke (HCC)   SBO (small bowel  obstruction) (HCC)   Intraabdominal fluid collection absess   Fungemia   Abdominal pain   History of CVA with residual deficit   Tobacco abuse   Acute blood loss anemia   Post-operative pain   Leukocytosis   Ischemic neuropathy of left foot   LOS: 4 days

## 2017-07-02 LAB — CBC
HEMATOCRIT: 24 % — AB (ref 36.0–46.0)
Hemoglobin: 7.7 g/dL — ABNORMAL LOW (ref 12.0–15.0)
MCH: 24.7 pg — AB (ref 26.0–34.0)
MCHC: 32.1 g/dL (ref 30.0–36.0)
MCV: 76.9 fL — ABNORMAL LOW (ref 78.0–100.0)
Platelets: 298 10*3/uL (ref 150–400)
RBC: 3.12 MIL/uL — ABNORMAL LOW (ref 3.87–5.11)
RDW: 19.3 % — AB (ref 11.5–15.5)
WBC: 16.3 10*3/uL — ABNORMAL HIGH (ref 4.0–10.5)

## 2017-07-02 LAB — HEPARIN LEVEL (UNFRACTIONATED)
HEPARIN UNFRACTIONATED: 0.21 [IU]/mL — AB (ref 0.30–0.70)
Heparin Unfractionated: 0.6 IU/mL (ref 0.30–0.70)

## 2017-07-02 NOTE — Progress Notes (Signed)
Subjective: Interval History: none.. Remains comfortable. Denying pain in her foot  Objective: Vital signs in last 24 hours: Temp:  [97.6 F (36.4 C)-99.3 F (37.4 C)] 97.6 F (36.4 C) (10/07 0400) Pulse Rate:  [93-105] 93 (10/07 1200) Resp:  [14-19] 18 (10/07 1200) BP: (129-142)/(55-83) 129/55 (10/07 1200) SpO2:  [98 %-100 %] 100 % (10/07 1200)  Intake/Output from previous day: 10/06 0701 - 10/07 0700 In: 1072.7 [P.O.:560; I.V.:512.7] Out: 1332 [Urine:1200; Drains:132] Intake/Output this shift: Total I/O In: 5 [Other:5] Out: 0   No change in her foot. Continue blistering and a complete insensate. VAC in place over her fasciotomy incisions  Lab Results:  Recent Labs  07/01/17 0621 07/02/17 0333  WBC 20.0* 16.3*  HGB 7.8* 7.7*  HCT 24.2* 24.0*  PLT 274 298   BMET  Recent Labs  06/30/17 0942 07/01/17 0621  NA 138 136  K 3.3* 4.1  CL 110 106  CO2 21* 21*  GLUCOSE 102* 91  BUN <5* <5*  CREATININE 0.89 0.81  CALCIUM 6.9* 7.0*    Studies/Results: Dg Abd Portable 1v  Result Date: 06/28/2017 CLINICAL DATA:  Abdominal pain and nausea. EXAM: PORTABLE ABDOMEN - 1 VIEW COMPARISON:  None. FINDINGS: The bowel gas pattern is normal. No radio-opaque calculi or other significant radiographic abnormality are seen. Pigtail drainage catheter seen in the pelvis. Enteric contrast from prior CT. Degenerative change both hips. IMPRESSION: No obstruction or visible free air. Electronically Signed   By: Staci Righter M.D.   On: 06/28/2017 13:47   Anti-infectives: Anti-infectives    Start     Dose/Rate Route Frequency Ordered Stop   06/29/17 2300  fluconazole (DIFLUCAN) IVPB 400 mg     400 mg 100 mL/hr over 120 Minutes Intravenous Every 24 hours 06/28/17 1413     06/29/17 2200  metroNIDAZOLE (FLAGYL) tablet 500 mg     500 mg Oral Every 8 hours 06/29/17 1918     06/29/17 0600  cefTRIAXone (ROCEPHIN) 2 g in dextrose 5 % 50 mL IVPB     2 g 100 mL/hr over 30 Minutes Intravenous  Every 24 hours 06/28/17 1413     06/28/17 2300  fluconazole (DIFLUCAN) IVPB 800 mg     800 mg 200 mL/hr over 120 Minutes Intravenous  Once 06/28/17 1413 06/29/17 0148   06/28/17 2200  anidulafungin (ERAXIS) 100 mg in sodium chloride 0.9 % 100 mL IVPB  Status:  Discontinued     100 mg 78 mL/hr over 100 Minutes Intravenous Daily at bedtime 06/27/17 2324 06/28/17 1413   06/28/17 0600  levofloxacin (LEVAQUIN) IVPB 500 mg  Status:  Discontinued     500 mg 100 mL/hr over 60 Minutes Intravenous Every 24 hours 06/28/17 0006 06/28/17 1413   06/28/17 0100  anidulafungin (ERAXIS) 200 mg in sodium chloride 0.9 % 200 mL IVPB     200 mg 78 mL/hr over 200 Minutes Intravenous  Once 06/28/17 0015 06/28/17 0549   06/27/17 2359  metroNIDAZOLE (FLAGYL) IVPB 500 mg  Status:  Discontinued     500 mg 100 mL/hr over 60 Minutes Intravenous Every 8 hours 06/27/17 2324 06/29/17 1918   06/27/17 2359  levofloxacin (LEVAQUIN) IVPB 500 mg  Status:  Discontinued     500 mg 100 mL/hr over 60 Minutes Intravenous Daily at bedtime 06/27/17 2324 06/28/17 0006   06/27/17 2030  vancomycin (VANCOCIN) IVPB 1000 mg/200 mL premix     1,000 mg 200 mL/hr over 60 Minutes Intravenous Every 12 hours 06/27/17 1741 06/28/17 1110  06/27/17 1345  cefUROXime (ZINACEF) 1.5 g in dextrose 5 % 50 mL IVPB     1.5 g 100 mL/hr over 30 Minutes Intravenous To Surgery 06/27/17 1340 06/27/17 1341      Assessment/Plan: s/p Procedure(s): BILATERAL FEMORAL POPLITEAL EMBOLECTOMY (Bilateral) PATCH ANGIOPLASTY Left Posterior Tibial Artery (Left) FULL COMPARTMENT LEFT LOWER LEG FASCIOTOMY (Left) Difficult situation. Again explained to the patient is no option other than amputation. She continues to not consent to this stating she needs some more time to think. She will discuss this with her family when they meet with her today   LOS: 5 days   Anna Hayes 07/02/2017, 1:03 PM

## 2017-07-02 NOTE — Progress Notes (Signed)
PROGRESS NOTE  Anna Hayes EVO:[id number] DOB: [birth year] DOA: 06/27/2017 PCP: Rochel Brome, MD  Brief Narrative: From discharge summary: 66 year old woman presented with abdominal pain and was admitted to [facility] 9/22. Found to have small bowel obstruction. Underwent expiratory laparotomy with lysis of adhesions and release of small bowel obstruction, repair of small bowel perforation. Subsequently had fevers, leukocytosis and CT abdomen and pelvis 9/29 revealed pelvic abscess and underwent successful placement of percutaneous drain October 1. Also noted to have fungemia on 1/2 blood culture 9/27 and started on antifungal therapy. She subsequently developed left lower stomach pain and discomfort October 1 and underwent CT angiogram which revealed bilateral arterial blockage both lower extremities, was started on heparin and emergently transferred to [facility] where she underwent thrombectomy and fasciotomy October 2.  Assessment/Plan Critical left leg ischemia, status post bilateral femoral embolectomy, angioplasty left posterior tibial artery, fasciotomy left leg 10/2. -VVS managing. Left foot nonviable. Left BKA recommended but patient having difficulty making decision -consult PMT to assist patient with [place] given complex situation  Status post laparotomy, lysis of adhesions and repair of perforated bowel 9/24 at [facility]. -per general surgery. Wound VAC.    Abdominal abscess, status post percutaneous drain placement October 1. On treatment with Levaquin and Flagyl since 9/27. -WBC slowly trending down. Continue abx as per ID  -IR plans to repeat CT 10/8 to reassess abscess   Fungemia Candida tropicalis  -continue fluconazole per ID total 2 weeks.   -will need formal outpatient ophthalmologist dilated funduscopic exam to exclude fungal endolpthalmitis  Hypoalbuminemia.  -diet per surgery .  -supplements per dietician   Microcytic anemia, likely multifactorial  including perioperative blood loss. S/p 2 units PRBC 10/2. -Hgb stable.    Obesity unspecified  Status post AKI  PMH stroke with left upper and left lower extremity weakness, left facial droop.  DVT prophylaxis: heparin infusion  Code Status: full Family Communication: none Disposition Plan: CIR?   Murray Hodgkins, MD  [facility] Direct contact: [phone] --Via amion app OR  --[email or web address]; password [email or web address]  7PM-7AM contact night coverage as above 07/02/2017, 3:51 PM  LOS: 5 days   Consultants:  Vascular surgery  General surgery  ID  IR  PMR  Procedures:  Exploratory laparotomy 9/24 with lysis of adhesions and release of small bowel obstruction, repair of small bowel perforation.  Percutaneous drain placement right buttock for pelvic abscess October 1.   10/2 transfusion 2 units PRBC 10/2:   BILATERAL FEMORAL POPLITEAL EMBOLECTOMY (Bilateral)  PATCH ANGIOPLASTY Left Posterior Tibial Artery (Left)  FULL COMPARTMENT LEFT LOWER LEG FASCIOTOMY (Left)  Antimicrobials: Anidulafungin 10/2 >>10/3 Fluconazole 10/3 >> 10/17 Levaquin 10/2 >> Metronidazole 10/2 >>  Interval history/Subjective: Feels ok.   Objective: Vitals: afebrile, VSS, 18, 93, 129/55  Exam:    Constitutional:  Appears calm and comfortable Respiratory:  CTA bilaterally, no w/r/r.  Respiratory effort normal. Cardiovascular:  RRR, no m/r/g Musculoskeletal:  LUE flaccid RUE movement appears grossly normal LLE: foot is black, edematous. Blisters on ankle, lower leg. Psychiatric:  Mental status Mood, affect appropriate  I have personally reviewed the following:   Labs:  Hgb stable 7.7  WBC trending down, 20 >> 16.3   Review and summation of old records:  Discharge summary: 66 year old woman presented with abdominal pain and was admitted to [facility] 9/22. Found to have small bowel obstruction. Underwent expiratory laparotomy with lysis of adhesions and release  of small bowel obstruction, repair of small bowel perforation. Subsequently had fevers, leukocytosis and  CT abdomen and pelvis 9/29 revealed pelvic abscess and underwent successful placement of percutaneous drain October 1. Also noted to have fungemia on 1/2 blood culture 9/27 and started on antifungal therapy. She subsequently developed left lower stomach pain and discomfort October 1 and underwent CT angiogram which revealed bilateral arterial blockage both lower extremities, was started on heparin and emergently transferred to [facility].  Operative report 9/24: Small bowel obstruction. Exploratory laparotomy, lysis of adhesions with release of small bowel obstruction, repair small bowel perforation.  CT abdomen and pelvis 9/29: Bilocular fluid collection in the pelvis. 0.4 x 8.4 x 3.7.  10/1 CT-guided right transgluteal drain placement.  Scheduled Meds: . amLODipine  5 mg Oral Daily  . docusate sodium  100 mg Oral Daily  . feeding supplement (ENSURE ENLIVE)  237 mL Oral BID BM  . gabapentin  300 mg Oral BID  . lisinopril  10 mg Oral Daily  . metroNIDAZOLE  500 mg Oral Q8H  . multivitamin with minerals  1 tablet Oral Daily  . pantoprazole  40 mg Oral Daily  . simvastatin  20 mg Oral q1800   Continuous Infusions: . sodium chloride    . sodium chloride    . cefTRIAXone (ROCEPHIN)  IV 2 g (07/02/17 0548)  . fluconazole (DIFLUCAN) IV Stopped (07/02/17 0111)  . heparin 1,250 Units/hr (07/02/17 0529)    Principal Problem:   Critical lower limb ischemia Active Problems:   PAD (peripheral artery disease) (HCC)   Stroke (HCC)   SBO (small bowel obstruction) (HCC)   Intraabdominal fluid collection absess   Fungemia   Abdominal pain   History of CVA with residual deficit   Tobacco abuse   Acute blood loss anemia   Post-operative pain   Leukocytosis   Ischemic neuropathy of left foot   LOS: 5 days

## 2017-07-02 NOTE — Plan of Care (Signed)
Problem: Activity: Goal: Risk for activity intolerance will decrease Outcome: Not Progressing awaitinting decision on BKA versus palliative care, pt oob  To chair

## 2017-07-02 NOTE — Progress Notes (Signed)
ANTICOAGULATION CONSULT NOTE - Follow Up Consult  Pharmacy Consult for heparin Indication: LE ischemia  Labs:  Recent Labs  06/29/17 1044  06/30/17 0743  06/30/17 0942 07/01/17 0621 07/02/17 0333  HGB  --   --   --   < > 8.2* 7.8* 7.7*  HCT  --   --   --   --  25.6* 24.2* 24.0*  PLT  --   --   --   --  279 274 298  APTT 49*  --   --   --   --   --   --   LABPROT 14.5  --   --   --   --   --   --   INR 1.14  --   --   --   --   --   --   HEPARINUNFRC 0.29*  < > 0.54  --   --  0.28* <0.10*  CREATININE  --   --   --   --  0.89 0.81  --   < > = values in this interval not displayed.   Assessment: 66yo female now undetectable on heparin after level had been dropping; no issues with gtt or signs of bleeding per RN.  Goal of Therapy:  Heparin level 0.3-0.5 units/ml   Plan:  Will increase heparin gtt by 3 units/kg/hr to 1250 units/hr and check level in [place], PharmD, BCPS  07/02/2017,5:29 AM

## 2017-07-02 NOTE — Progress Notes (Signed)
[place] for Heparin Indication: Leg ischemia   Allergies  Allergen Reactions  . Codeine   . Latex     Blisters  . Penicillins     Patient Measurements: Height: 5\' 2"  (157.5 cm) Weight: 175 lb (79.4 kg) IBW/kg (Calculated) : 50.1 Heparin Dosing Weight: 80 kg   Vital Signs: Temp: 97.6 F (36.4 C) (10/07 0400) Temp Source: Oral (10/07 0400) BP: 129/55 (10/07 1200) Pulse Rate: 93 (10/07 1200)  Labs:  Recent Labs  06/30/17 0942 07/01/17 0621 07/02/17 0333 07/02/17 1313  HGB 8.2* 7.8* 7.7*  --   HCT 25.6* 24.2* 24.0*  --   PLT 279 274 298  --   HEPARINUNFRC  --  0.28* <0.10* 0.60  CREATININE 0.89 0.81  --   --     Estimated Creatinine Clearance: 64.9 mL/min (by C-G formula based on SCr of 0.81 mg/dL).  Assessment: 89 yof s/p ex lap with lysis of adhesions and small bowel perforation repair on 9/24, admitted with ischemic left limb. He is now s/p bilateral femoral popliteal embolectomy and left leg fasciotomy on 10/2. She is on heparin with plans for possible amputation on 10/8 -Heparin level is slightly above goal on 1250 units/hr (recent levels have been 0.28 and < 0.1)  Goal of Therapy:  Heparin level 0.3-0.5 units/ml Monitor platelets by anticoagulation protocol: Yes   Plan:  -No heparin changes now -Repeat a heparin level in 6 hrs -Daily heparin level and CBC  Hildred Laser, Pharm D 07/02/2017 2:54 PM

## 2017-07-02 NOTE — Progress Notes (Signed)
[place] for Heparin Indication: Leg ischemia   Allergies  Allergen Reactions  . Codeine   . Latex     Blisters  . Penicillins     Patient Measurements: Height: 5\' 2"  (157.5 cm) Weight: 175 lb (79.4 kg) IBW/kg (Calculated) : 50.1 Heparin Dosing Weight: 80 kg   Vital Signs: BP: 140/71 (10/07 1600) Pulse Rate: 98 (10/07 1600)  Labs:  Recent Labs  06/30/17 0942 07/01/17 0621 07/02/17 0333 07/02/17 1313 07/02/17 1859  HGB 8.2* 7.8* 7.7*  --   --   HCT 25.6* 24.2* 24.0*  --   --   PLT 279 274 298  --   --   HEPARINUNFRC  --  0.28* <0.10* 0.60 0.21*  CREATININE 0.89 0.81  --   --   --     Estimated Creatinine Clearance: 64.9 mL/min (by C-G formula based on SCr of 0.81 mg/dL).  Assessment: 30 yof s/p ex lap with lysis of adhesions and small bowel perforation repair on 9/24, admitted with ischemic left limb. He is now s/p bilateral femoral popliteal embolectomy and left leg fasciotomy on 10/2. She is on heparin with plans for possible amputation on 10/8 -heparin level is now below goal  Goal of Therapy:  Heparin level 0.3-0.5 units/ml Monitor platelets by anticoagulation protocol: Yes   Plan:  -Increase heparin to 1400 units/hr -Daily heparin level and CBC  Hildred Laser, Pharm D 07/02/2017 7:46 PM

## 2017-07-02 NOTE — Plan of Care (Signed)
Problem: Safety: Goal: Ability to remain free from injury will improve Outcome: Progressing Bed in low position, non skid socks on, bed alarm on, call bell and belongings in reach, hourly rounds done, pt calls appropriately for assistance.

## 2017-07-03 ENCOUNTER — Inpatient Hospital Stay (HOSPITAL_COMMUNITY): Payer: Medicare Other

## 2017-07-03 ENCOUNTER — Encounter (HOSPITAL_COMMUNITY): Payer: Self-pay | Admitting: Radiology

## 2017-07-03 DIAGNOSIS — Z7189 Other specified counseling: Secondary | ICD-10-CM

## 2017-07-03 DIAGNOSIS — Z515 Encounter for palliative care: Secondary | ICD-10-CM

## 2017-07-03 LAB — CK: CK TOTAL: 3206 U/L — AB (ref 38–234)

## 2017-07-03 LAB — CBC
HEMATOCRIT: 24.3 % — AB (ref 36.0–46.0)
Hemoglobin: 7.8 g/dL — ABNORMAL LOW (ref 12.0–15.0)
MCH: 24.6 pg — ABNORMAL LOW (ref 26.0–34.0)
MCHC: 32.1 g/dL (ref 30.0–36.0)
MCV: 76.7 fL — ABNORMAL LOW (ref 78.0–100.0)
Platelets: 281 10*3/uL (ref 150–400)
RBC: 3.17 MIL/uL — AB (ref 3.87–5.11)
RDW: 19.1 % — AB (ref 11.5–15.5)
WBC: 15.4 10*3/uL — AB (ref 4.0–10.5)

## 2017-07-03 LAB — CULTURE, BLOOD (ROUTINE X 2)
Culture: NO GROWTH
Special Requests: ADEQUATE

## 2017-07-03 LAB — BASIC METABOLIC PANEL
ANION GAP: 10 (ref 5–15)
BUN: <5 mg/dL — ABNORMAL LOW (ref 6–20)
CHLORIDE: 100 mmol/L — AB (ref 101–111)
CO2: 25 mmol/L (ref 22–32)
CREATININE: 0.76 mg/dL (ref 0.44–1.00)
Calcium: 7.3 mg/dL — ABNORMAL LOW (ref 8.9–10.3)
GFR calc non Af Amer: >60 mL/min (ref >60)
Glucose, Bld: 87 mg/dL (ref 65–99)
Potassium: 3.6 mmol/L (ref 3.5–5.1)
SODIUM: 135 mmol/L (ref 135–145)

## 2017-07-03 LAB — HEPARIN LEVEL (UNFRACTIONATED)
HEPARIN UNFRACTIONATED: 0.57 [IU]/mL (ref 0.30–0.70)
Heparin Unfractionated: 0.58 IU/mL (ref 0.30–0.70)

## 2017-07-03 MED ORDER — IOPAMIDOL (ISOVUE-300) INJECTION 61%
INTRAVENOUS | Status: AC
  Administered 2017-07-03: 100 mL
  Filled 2017-07-03: qty 100

## 2017-07-03 MED ORDER — HEPARIN (PORCINE) IN NACL 100-0.45 UNIT/ML-% IJ SOLN
1000.0000 [IU]/h | INTRAMUSCULAR | Status: DC
  Administered 2017-07-03: 1200 [IU]/h via INTRAVENOUS
  Administered 2017-07-04: 1100 [IU]/h via INTRAVENOUS
  Filled 2017-07-03 (×2): qty 250

## 2017-07-03 NOTE — Consult Note (Signed)
[place] Nurse wound consult note Vascular team following for assessment and plan of care to left leg wound and vac dressing. Reason for Consult: Vac dressing change to abd wound Wound type: Full thickness post-op wound with sutures visible to inner wound bed Wound bed: Appearance unchanged since previous assessment. Red and moist Drainage (amount, consistency, odor) mod amt pink drainage in the cannister, no odor Periwound: Intact skin surrounding Dressing procedure/placement/frequency: Applied Mepitel contact layer, then one piece white foam, then one piece black foam.  Pt medicated for pain prior to the procedure and tolerated with mod amt discomfort.  WOC will plan to change abd Vac dressing Q M/W/F.  Discussed plan of care and pt verbalized understanding. Julien Girt MSN, RN, [place], [place], [place]

## 2017-07-03 NOTE — Progress Notes (Signed)
ANTICOAGULATION CONSULT NOTE - Follow Up Consult  Pharmacy Consult for heparin Indication: LE ischemia  Labs:  Recent Labs  07/01/17 0621 07/02/17 0333  07/02/17 1859 07/03/17 0306 07/03/17 1152  HGB 7.8* 7.7*  --   --  7.8*  --   HCT 24.2* 24.0*  --   --  24.3*  --   PLT 274 298  --   --  281  --   HEPARINUNFRC 0.28* <0.10*  < > 0.21* 0.57 0.58  CREATININE 0.81  --   --   --   --   --   < > = values in this interval not displayed.   Assessment: 66yof s/p ex lap with lysis of adhesions and small bowel perforation repair on 9/24, admitted with ischemic left limb s/p bilateral femoral popliteal embolectomy and left leg fasciotomy on 10/2.   Heparin level came back today at 0.58 after dose reduction to 1300 units/hr earlier. CBC now stable. No infusion issues noted by nursing. No signs/symptoms of bleeding.   Goal of Therapy:  Heparin level 0.3-0.5 units/ml   Plan:  Decrease heparin infusion to 1200 units/hr Check level in 6 hours Daily HL and CBC as needed  Doylene Canard, PharmD Clinical Pharmacist  Phone: [phone]  07/03/2017,1:14 PM

## 2017-07-03 NOTE — Progress Notes (Signed)
PROGRESS NOTE  Anna Hayes KXF:[id number] DOB: [birth year] DOA: 06/27/2017 PCP: Rochel Brome, MD  HPI/Recap of past 90 hours: 66 year old woman presented with abdominal pain and was admitted to [facility] 9/22. Found to have small bowel obstruction. Underwent expiratory laparotomy with lysis of adhesions and release of small bowel obstruction, repair of small bowel perforation. Subsequently had fevers, leukocytosis and CT abdomen and pelvis 9/29 revealed pelvic abscess and underwent successful placement of percutaneous drain October 1. Also noted to have fungemia on 1/2 blood culture 9/27 and started on antifungal therapy. She subsequently developed left lower stomach pain and discomfort October 1 and underwent CT angiogram which revealed bilateral arterial blockage both lower extremities, was started on heparin and emergently transferred to [facility] where she underwent thrombectomy and fasciotomy October 2.  Since then, vascular surgery has felt that her left foot is nonviable and left BKA has been recommended, but patient is having difficulty making that decision. Palliative medicine was consulted and patient met with them and family today for goals of care discussion.  After this meeting, patient has opted for surgery.  Assessment/Plan: Critical left leg ischemia, status post bilateral femoral embolectomy, angioplasty left posterior tibial artery, fasciotomy left leg 10/2. -VVS managing. Left foot nonviable. Patient now amenable to left BKA. Vascular surgery looking at potential surgery on 10/10  Status post laparotomy, lysis of adhesions and repair of perforated bowel 9/24 at [facility]. -per general surgery. Wound VAC.    Abdominal abscess, status post percutaneous drain placement October 1. On treatment with Levaquin and Flagyl since 9/27. -WBC slowly trending down. Continue abx as per ID  CT done 10/8 notes slightly improved fluid collections  Fungemia Candida tropicalis    -continue fluconazole per ID total 2 weeks.   -will need formal outpatient ophthalmologist dilated funduscopic exam to exclude fungal endolpthalmitis  Hypoalbuminemia.  -diet per surgery .  -supplements per dietician   Microcytic anemia, likely multifactorial including perioperative blood loss. S/p 2 units PRBC 10/2. -Hgb stable.    Obesity unspecified: Beats criteria BMI greater than 30  Status post AKI: Now resolved  PMH stroke with left upper and left lower extremity weakness, left facial droop  Code Status: Full code   Family Communication: Multiple family members at the bedside   Disposition Plan: Determined if patient desires amputation    Consultants: Vascular surgery General surgery Infectious disease Interventional radiology Physical medicine and rehabilitation   Procedures:  9/24: Exploratory laparoscopy with lysis of adhesions and release of small bowel obstruction, repair of small bowel perforation  10/2: Transfused 2 units packed red blood cells  10/1: Percutaneous drain placement and right buttock for pelvic abscess  10/2: Bilateral femoral popliteal embolectomy, left posterior tibial artery patch angioplasty and left lower leg fasciotomy   Antimicrobials: Anidulafungin 10/2 >>10/3 Fluconazole 10/3 >> 10/17 Levaquin 10/2 >>  Metronidazole 10/2 >>   DVT prophylaxis:  Heparin   Objective: Vitals:   07/03/17 0800 07/03/17 1200 07/03/17 1315 07/03/17 1600  BP: 127/64 (!) 119/95  127/68  Pulse: (!) 102 (!) 102  (!) 117  Resp: _0 Temp: 97.6 F (36.4 C)  99.3 F (37.4 C) 98.2 F (36.8 C)  TempSrc: Oral  Oral Oral  SpO2: 100% 100%  100%  Weight:      Height:        Intake/Output Summary (Last 24 hours) at 07/03/17 1647 Last data filed at 07/03/17 1515  Gross per 24 hour  Intake  1157.52 ml  Output             2195 ml  Net         -1037.48 ml   Filed Weights   06/27/17 1142 07/01/17 0526  Weight: 79.6 kg (175 lb 8  oz) 79.4 kg (175 lb)    Exam:   General:  Alert and oriented 3, a little sad  HEENT: Normal cephalic atraumatic, mucous members are dry  Neck: Supple, no JVD   Cardiovascular: Regular rate and rhythm, S1-S2   Respiratory: Clear auscultation bilaterally, breathing not labored   Abdomen: Soft, nontender, nondistended, positive bowel sounds   Musculoskeletal: No clubbing cyanosis, trace pitting edema   Skin: Left foot blackened, 2+ edema with notable blisters.  Psychiatry: Slightly depressed affect, no evidence of acute psychoses    Data Reviewed: CBC:  Recent Labs Lab 06/29/17 0500 06/30/17 0942 07/01/17 0621 07/02/17 0333 07/03/17 0306  WBC 22.4* 20.5* 20.0* 16.3* 15.4*  HGB 8.8* 8.2* 7.8* 7.7* 7.8*  HCT 25.9* 25.6* 24.2* 24.0* 24.3*  MCV 76.4* 77.1* 77.3* 76.9* 76.7*  PLT 152 279 274 298 916   Basic Metabolic Panel:  Recent Labs Lab 06/28/17 0423 06/29/17 0500 06/30/17 0942 07/01/17 0621 07/03/17 1152  NA 135 136 138 136 135  K 4.7 4.4 3.3* 4.1 3.6  CL 108 109 110 106 100*  CO2 20* 18* 21* 21* 25  GLUCOSE 107* 53* 102* 91 87  BUN 5* 5* <5* <5* <5*  CREATININE 0.74 0.90 0.89 0.81 0.76  CALCIUM 6.7* 6.7* 6.9* 7.0* 7.3*  MG  --   --   --  1.7  --    GFR: Estimated Creatinine Clearance: 65.7 mL/min (by C-G formula based on SCr of 0.76 mg/dL). Liver Function Tests:  Recent Labs Lab 06/27/17 1550 06/29/17 0500 07/01/17 0621  AST 44* 42* 32  ALT _0 ALKPHOS 47 51 47  BILITOT 0.4 0.7 0.6  PROT 4.3* 4.8* 5.2*  ALBUMIN 1.4* 1.7* 1.6*   No results for input(s): LIPASE, AMYLASE in the last 168 hours. No results for input(s): AMMONIA in the last 168 hours. Coagulation Profile:  Recent Labs Lab 06/29/17 1044  INR 1.14   Cardiac Enzymes:  Recent Labs Lab 07/03/17 1152  CKTOTAL 3,206*   BNP (last 3 results) No results for input(s): PROBNP in the last 8760 hours. HbA1C: No results for input(s): HGBA1C in the last 72 hours. CBG: No  results for input(s): GLUCAP in the last 168 hours. Lipid Profile: No results for input(s): CHOL, HDL, LDLCALC, TRIG, CHOLHDL, LDLDIRECT in the last 72 hours. Thyroid Function Tests: No results for input(s): TSH, T4TOTAL, FREET4, T3FREE, THYROIDAB in the last 72 hours. Anemia Panel: No results for input(s): VITAMINB12, FOLATE, FERRITIN, TIBC, IRON, RETICCTPCT in the last 72 hours. Urine analysis: No results found for: COLORURINE, APPEARANCEUR, LABSPEC, [place], GLUCOSEU, HGBUR, BILIRUBINUR, KETONESUR, PROTEINUR, UROBILINOGEN, NITRITE, LEUKOCYTESUR Sepsis Labs: _1 (procalcitonin:4,lacticidven:4)  ) Recent Results (from the past 240 hour(s))  Culture, blood (Routine X 2) w Reflex to ID Panel     Status: Abnormal   Collection Time: 06/28/17 11:00 AM  Result Value Ref Range Status   Specimen Description BLOOD LEFT ARM  Final   Special Requests IN PEDIATRIC BOTTLE Blood Culture adequate volume  Final   Culture  Setup Time   Final    GRAM POSITIVE COCCI IN PEDIATRIC BOTTLE CRITICAL RESULT CALLED TO, READ BACK BY AND VERIFIED WITH: N. Batchelder Pharm.D. 9:55 06/29/17 ([email or web address])    Culture (  A)  Final    STAPHYLOCOCCUS SPECIES (COAGULASE NEGATIVE) THE SIGNIFICANCE OF ISOLATING THIS ORGANISM FROM A SINGLE SET OF BLOOD CULTURES WHEN MULTIPLE SETS ARE DRAWN IS UNCERTAIN. PLEASE NOTIFY THE MICROBIOLOGY DEPARTMENT WITHIN ONE WEEK IF SPECIATION AND SENSITIVITIES ARE REQUIRED.    Report Status 07/01/2017 FINAL  Final  Blood Culture ID Panel (Reflexed)     Status: Abnormal   Collection Time: 06/28/17 11:00 AM  Result Value Ref Range Status   Enterococcus species NOT DETECTED NOT DETECTED Final   Listeria monocytogenes NOT DETECTED NOT DETECTED Final   Staphylococcus species DETECTED (A) NOT DETECTED Final    Comment: Methicillin (oxacillin) resistant coagulase negative staphylococcus. Possible blood culture contaminant (unless isolated from more than one blood culture draw or clinical case  suggests pathogenicity). No antibiotic treatment is indicated for blood  culture contaminants. CRITICAL RESULT CALLED TO, READ BACK BY AND VERIFIED WITH: N. Batchelder Pharm.D. 9:55 06/29/17 ([email or web address])    Staphylococcus aureus NOT DETECTED NOT DETECTED Final   Methicillin resistance DETECTED (A) NOT DETECTED Final    Comment: CRITICAL RESULT CALLED TO, READ BACK BY AND VERIFIED WITH: N. Batchelder Pharm.D. 9:55 06/29/17 ([email or web address])    Streptococcus species NOT DETECTED NOT DETECTED Final   Streptococcus agalactiae NOT DETECTED NOT DETECTED Final   Streptococcus pneumoniae NOT DETECTED NOT DETECTED Final   Streptococcus pyogenes NOT DETECTED NOT DETECTED Final   Acinetobacter baumannii NOT DETECTED NOT DETECTED Final   Enterobacteriaceae species NOT DETECTED NOT DETECTED Final   Enterobacter cloacae complex NOT DETECTED NOT DETECTED Final   Escherichia coli NOT DETECTED NOT DETECTED Final   Klebsiella oxytoca NOT DETECTED NOT DETECTED Final   Klebsiella pneumoniae NOT DETECTED NOT DETECTED Final   Proteus species NOT DETECTED NOT DETECTED Final   Serratia marcescens NOT DETECTED NOT DETECTED Final   Haemophilus influenzae NOT DETECTED NOT DETECTED Final   Neisseria meningitidis NOT DETECTED NOT DETECTED Final   Pseudomonas aeruginosa NOT DETECTED NOT DETECTED Final   Candida albicans NOT DETECTED NOT DETECTED Final   Candida glabrata NOT DETECTED NOT DETECTED Final   Candida krusei NOT DETECTED NOT DETECTED Final   Candida parapsilosis NOT DETECTED NOT DETECTED Final   Candida tropicalis NOT DETECTED NOT DETECTED Final  Culture, blood (Routine X 2) w Reflex to ID Panel     Status: None   Collection Time: 06/28/17 11:09 AM  Result Value Ref Range Status   Specimen Description BLOOD LEFT HAND  Final   Special Requests IN PEDIATRIC BOTTLE Blood Culture adequate volume  Final   Culture NO GROWTH 5 DAYS  Final   Report Status 07/03/2017 FINAL  Final  Body fluid culture     Status: None  (Preliminary result)   Collection Time: 07/01/17  2:27 PM  Result Value Ref Range Status   Specimen Description FLUID PELVIS RIGHT  Final   Special Requests Normal  Final   Gram Stain   Final    FEW WBC PRESENT, PREDOMINANTLY MONONUCLEAR NO ORGANISMS SEEN    Culture NO GROWTH 2 DAYS  Final   Report Status PENDING  Incomplete      Studies: Ct Abdomen Pelvis W Contrast  Addendum Date: 07/03/2017   ADDENDUM REPORT: 07/03/2017 14:20 ADDENDUM: Small amount of fluid around the right common femoral vessels compatible with recent surgery. No significant lymph node enlargement in the abdomen or pelvis. Electronically Signed   By: Markus Daft M.D.   On: 07/03/2017 14:20   Result Date: 07/03/2017 CLINICAL DATA:  Abdominal pain and fever.  History of pelvic fluid collection and status post percutaneous drain. EXAM: CT ABDOMEN AND PELVIS WITH CONTRAST TECHNIQUE: Multidetector CT imaging of the abdomen and pelvis was performed using the standard protocol following bolus administration of intravenous contrast. CONTRAST:  131m ISOVUE-300 IOPAMIDOL (ISOVUE-300) INJECTION 61% COMPARISON:  06/24/2017 and 06/26/2017 FINDINGS: Lower chest: Mild atelectasis at the lung bases. No pleural effusions. Coronary artery calcifications. Hepatobiliary: Normal appearance of the liver, gallbladder and portal venous system. No biliary dilatation. Pancreas: Normal appearance of the pancreas without inflammation or duct dilatation. Spleen: Normal appearance of spleen without enlargement. Adrenals/Urinary Tract: Stable nodularity in the left adrenal gland. Right adrenal tissue is unremarkable. Low-density structure in the right kidney upper pole likely represents a cyst. This cyst is slightly more dense than would be expected but no significantly change in Hounsfield units on the delayed imaging. No hydronephrosis. Small focus of gas within the urinary bladder. Stomach/Bowel: Normal appearance of stomach. Decreased distension of small  bowel loops containing fluid in left abdomen. No evidence for a high-grade bowel obstruction. Evidence for an appendectomy. Vascular/Lymphatic: Atherosclerotic calcifications in the aorta and iliac arteries without aneurysm. There appears to be blood flow in the main visceral arteries. Portal venous system and main mesenteric veins are patent. IVC and renal veins are patent. Iliac veins appear to be patent. Reproductive: Uterus has been removed. Again noted is a small cyst or follicle in the right ovary. Pelvic fluid collection is adjacent to left adnexa. Other: Again noted is a right transgluteal pigtail drain within a small pelvic fluid collection. The pelvic fluid collection has decreased in size measuring 2.6 x 5.2 cm and previously measured 3.3 x 8.0 cm. This collection is adjacent to the left adnexa. Again noted is a fluid collection in the anterior lower abdomen which measures 7.4 x 2.9 cm on sequence 3, image 60, previously measured 10.0 x 3.4 cm. The anterior abdominal incision has been opened and the patient now has a wound VAC in this area. There is extensive edematous tissue or fat between the wound VAC and this anterior fluid collection. Again noted is a small amount of fluid just anterior to the left psoas muscle on sequence 3, image 58. Again noted is a small amount of soft tissue gas along the anterior abdominal incision on sequence 3, image 44. There is a fluid collection along the right side of lower abdomen on sequence 3, image 69 that could be associated with bowel. Musculoskeletal: No acute bone abnormality. There is fluid collection just anterior to the left common femoral vessels on sequence 3, image 84. This collection measures 3.4 x 2.7 cm and compatible with recent surgery. There is a small air-fluid collection just below the skin surface in the left inguinal region which is likely postoperative as well. Subcutaneous edema in the abdomen and pelvis. IMPRESSION: Pelvic abscess collection  containing a percutaneous drain has decreased in size but there is residual fluid in this collection. There is a residual fluid collection or abscess in the anterior lower abdomen that has decreased in size. Postsurgical changes with a wound VAC in the anterior lower abdomen. Probable right renal cyst. Small amount of gas in the urinary bladder that could be iatrogenic. Electronically Signed: By: AMarkus DaftM.D. On: 07/03/2017 13:46    Scheduled Meds: . amLODipine  5 mg Oral Daily  . docusate sodium  100 mg Oral Daily  . feeding supplement (ENSURE ENLIVE)  237 mL Oral BID BM  . gabapentin  300 mg Oral BID  .  lisinopril  10 mg Oral Daily  . metroNIDAZOLE  500 mg Oral Q8H  . multivitamin with minerals  1 tablet Oral Daily  . pantoprazole  40 mg Oral Daily  . simvastatin  20 mg Oral q1800    Continuous Infusions: . sodium chloride    . sodium chloride    . cefTRIAXone (ROCEPHIN)  IV 2 g (07/03/17 0547)  . fluconazole (DIFLUCAN) IV Stopped (07/02/17 2338)  . heparin Stopped (07/03/17 1515)     LOS: 6 days     Annita Brod, MD [facility] Pager [phone]  If 7PM-7AM, please contact night-coverage [email or web address] Password [facility] 07/03/2017, 4:47 PM

## 2017-07-03 NOTE — Progress Notes (Signed)
Patient ID: Anna Hayes, female   DOB: May 31, 1951, 66 y.o.   MRN: [id number]  [facility] Surgery Progress Note  6 Days Post-Op  Subjective: CC- depressed Denies any current abdominal pain. Tolerating full liquids. Having loose BMs. Denies n/v. Had a repeat CT scan today, report pending. Depressed because she has been told that she needs a LLE amputation.  Objective: Vital signs in last 24 hours: Temp:  [98.6 F (37 C)-98.8 F (37.1 C)] 98.8 F (37.1 C) (10/08 0400) Pulse Rate:  [93-103] 102 (10/08 0800) Resp:  [9-25] 15 (10/08 0800) BP: (104-140)/(53-71) 127/64 (10/08 0800) SpO2:  [98 %-100 %] 100 % (10/08 0800) Last BM Date: 07/02/17  Intake/Output from previous day: 10/07 0701 - 10/08 0700 In: 1779.5 [P.O.:1480; I.V.:289.5] Out: 3190 [Urine:3000; Drains:190] Intake/Output this shift: No intake/output data recorded.  PE: Gen: Alert, NAD, pleasant HEENT: Pupils equal and round, EOM's intact Card: RRR, no M/G/R appreciated Pulm: CTAB, no W/R/R, effort normal Abd: Soft, mild distension, NT, +BS, vac to midline incision             Pelvic drain: 10cc/24hr SS Psych: A&Ox3  Skin: no rashes noted, warm and dry  Lab Results:   Recent Labs  07/02/17 0333 07/03/17 0306  WBC 16.3* 15.4*  HGB 7.7* 7.8*  HCT 24.0* 24.3*  PLT 298 281   BMET  Recent Labs  07/01/17 0621  NA 136  K 4.1  CL 106  CO2 21*  GLUCOSE 91  BUN <5*  CREATININE 0.81  CALCIUM 7.0*   PT/INR No results for input(s): LABPROT, INR in the last 72 hours. CMP     Component Value Date/Time   NA 136 07/01/2017 0621   K 4.1 07/01/2017 0621   CL 106 07/01/2017 0621   CO2 21 (L) 07/01/2017 0621   GLUCOSE 91 07/01/2017 0621   BUN <5 (L) 07/01/2017 0621   CREATININE 0.81 07/01/2017 0621   CALCIUM 7.0 (L) 07/01/2017 0621   PROT 5.2 (L) 07/01/2017 0621   ALBUMIN 1.6 (L) 07/01/2017 0621   AST 32 07/01/2017 0621   ALT 17 07/01/2017 0621   ALKPHOS 47 07/01/2017 0621   BILITOT 0.6  07/01/2017 0621   GFRNONAA >60 07/01/2017 0621   GFRAA >60 07/01/2017 0621   Lipase  No results found for: LIPASE     Studies/Results: No results found.  Anti-infectives: Anti-infectives    Start     Dose/Rate Route Frequency Ordered Stop   06/29/17 2300  fluconazole (DIFLUCAN) IVPB 400 mg     400 mg 100 mL/hr over 120 Minutes Intravenous Every 24 hours 06/28/17 1413     06/29/17 2200  metroNIDAZOLE (FLAGYL) tablet 500 mg     500 mg Oral Every 8 hours 06/29/17 1918     06/29/17 0600  cefTRIAXone (ROCEPHIN) 2 g in dextrose 5 % 50 mL IVPB     2 g 100 mL/hr over 30 Minutes Intravenous Every 24 hours 06/28/17 1413     06/28/17 2300  fluconazole (DIFLUCAN) IVPB 800 mg     800 mg 200 mL/hr over 120 Minutes Intravenous  Once 06/28/17 1413 06/29/17 0148   06/28/17 2200  anidulafungin (ERAXIS) 100 mg in sodium chloride 0.9 % 100 mL IVPB  Status:  Discontinued     100 mg 78 mL/hr over 100 Minutes Intravenous Daily at bedtime 06/27/17 2324 06/28/17 1413   06/28/17 0600  levofloxacin (LEVAQUIN) IVPB 500 mg  Status:  Discontinued     500 mg 100 mL/hr over 60  Minutes Intravenous Every 24 hours 06/28/17 0006 06/28/17 1413   06/28/17 0100  anidulafungin (ERAXIS) 200 mg in sodium chloride 0.9 % 200 mL IVPB     200 mg 78 mL/hr over 200 Minutes Intravenous  Once 06/28/17 0015 06/28/17 0549   06/27/17 2359  metroNIDAZOLE (FLAGYL) IVPB 500 mg  Status:  Discontinued     500 mg 100 mL/hr over 60 Minutes Intravenous Every 8 hours 06/27/17 2324 06/29/17 1918   06/27/17 2359  levofloxacin (LEVAQUIN) IVPB 500 mg  Status:  Discontinued     500 mg 100 mL/hr over 60 Minutes Intravenous Daily at bedtime 06/27/17 2324 06/28/17 0006   06/27/17 2030  vancomycin (VANCOCIN) IVPB 1000 mg/200 mL premix     1,000 mg 200 mL/hr over 60 Minutes Intravenous Every 12 hours 06/27/17 1741 06/28/17 1110   06/27/17 1345  cefUROXime (ZINACEF) 1.5 g in dextrose 5 % 50 mL IVPB     1.5 g 100 mL/hr over 30 Minutes  Intravenous To Surgery 06/27/17 1340 06/27/17 1341       Assessment/Plan Lower limb ischemia - s/p thrombectomy of multiplebilateral vessels and left leg fasciotomy 06/27/17 Dr. Trula Slade. Vascular now discussing amputation PAD HTN H/o stroke with residual left sided weakness Anemia - S/p 2 units PRBC 10/2. Hg 7.8, stable AKI - BMP pending Fungemia candida tropicalis - fluconazole x2 weeks per ID  SBO - s/p exploratory laparotomy with lysis of adhesions and small bowel perforation repair for [facility] by Dr. Noberto Retort on 06/19/17 - s/p IR placement of transgluteal drain 10/1 for pelvic fluid collection seen on CT scan with only "thin fluid" returned. IR following here. - drain with 10cc/24hr serosanguinousfluid. Getting repeat CT today, hopefully can remove drain - tolerating diet and having BMs  ID - flagyl 10/2>>day#7, rocephin 10/4>>day#5, diflucan 10/4>>day#5 VTE - heparin FEN - regular diet; Boost Foley - none Follow up - Dr. Noberto Retort  Plan - Advance to regular diet. CT scan pending, hopefully can remove transgluteal drain. Vac already changed earlier today.   LOS: 6 days    Wellington Hampshire , [facility] Surgery 07/03/2017, 10:15 AM Pager: [phone] Consults: [phone] Mon-Fri 7:00 am-4:30 pm Sat-Sun 7:00 am-11:30 am

## 2017-07-03 NOTE — Progress Notes (Signed)
Patient was found bleeding from IV site by PT staff,upon assessment,PIV was displaced, PIV removed and guaze dressing applied on the site, site clean and dry, will start another IV and continue to monitor.

## 2017-07-03 NOTE — Progress Notes (Signed)
Physical Therapy Treatment Patient Details Name: Anna Hayes MRN: [id number] DOB: December 30, 1950 Today's Date: 07/03/2017    History of Present Illness Pt is a 66 y.o. female with medical history significant of CVA with left arm weakness present to [place] with a sbo after experincing symptoms for ten days.she went to OR on 9/24 had lysis of adhesions and repair of perforated bowel.  Post op she developed AKI with peak cr of 2.1 this stabalized with ivf.  On 9/27 she started developing some fevers and worsening leukocytosis.  ble u/s was done which was neg for DVT.  Ct scan of abd/pelvis showed a developing absess which was drained by IR on 10/1.  She was started on levaquin /flagyl on 9/27.  Blood cultures from 9/27 also growing out fungus therefore casopofugin was started on 10/1 after their doctor consulted with [facility].  On the evening around 9/30 she then developed an ischemic left limb.  A stat cta with run off was performed with showed bilateral occlusion to both legs.  Pt was then arranged to be transferred here to get emergent vascular surgery which she has gotten s/p thrombectomy of bilateral vessels(see vascular report) and left leg fasciotomy.     PT Comments    Patient was able to assist with lateral scoot transfer from EOB to recliner this session. Given the condition of pt's L LE we deferred attempts to stand to limit pain. Pt required mod/max A +2 for bed mobility and OOB transfer. Pt assisted with R UE/LE. Continue to progress as tolerated.     Follow Up Recommendations  CIR     Equipment Recommendations  Other (comment) (to be determined at next venue)    Recommendations for Other Services Rehab consult     Precautions / Restrictions Precautions Precautions: None Restrictions Weight Bearing Restrictions: No    Mobility  Bed Mobility Overal bed mobility: Needs Assistance Bed Mobility: Supine to Sit Rolling: Mod assist;+2 for physical assistance          General bed mobility comments: assist to bring bilat LE to EOB, scoot hips, and elevate trunk into sitting; cues for sequencing and technique; use of rail and HOB elevated  Transfers Overall transfer level: Needs assistance Equipment used: 2 person hand held assist Transfers: Lateral/Scoot Transfers          Lateral/Scoot Transfers: +2 physical assistance;Max assist General transfer comment: cues for sequencing; pt assisted with R UE and LE; therapist assisted with L UE and LE; +2 with use of chuck pad to scoot  Ambulation/Gait                 Stairs            Wheelchair Mobility    Modified Rankin (Stroke Patients Only)       Balance Overall balance assessment: Needs assistance Sitting-balance support: Feet supported;Single extremity supported Sitting balance-Leahy Scale: Fair Sitting balance - Comments: mod A intially and then min guard for safety                                    Cognition Arousal/Alertness: Awake/alert Behavior During Therapy: WFL for tasks assessed/performed Overall Cognitive Status: Within Functional Limits for tasks assessed  Exercises      General Comments General comments (skin integrity, edema, etc.): upon arrival pt was bleeding from IV site on L forearm and pillow was saturated; RN notifiied and came in prior to mobilizing pt to assess IV site      Pertinent Vitals/Pain Pain Assessment: Faces Faces Pain Scale: Hurts little more Pain Location: Lt LE and abdomen  Pain Descriptors / Indicators: Guarding;Sore Pain Intervention(s): Limited activity within patient's tolerance;Monitored during session;Repositioned    Home Living                      Prior Function            PT Goals (current goals can now be found in the care plan section) Acute Rehab PT Goals PT Goal Formulation: With patient Time For Goal Achievement: 07/12/17 Potential  to Achieve Goals: Good Progress towards PT goals: Progressing toward goals    Frequency    Min 3X/week      PT Plan Current plan remains appropriate    Co-evaluation              AM-PAC PT "6 Clicks" Daily Activity  Outcome Measure  Difficulty turning over in bed (including adjusting bedclothes, sheets and blankets)?: Unable Difficulty moving from lying on back to sitting on the side of the bed? : Unable Difficulty sitting down on and standing up from a chair with arms (e.g., wheelchair, bedside commode, etc,.)?: Unable Help needed moving to and from a bed to chair (including a wheelchair)?: A Lot Help needed walking in hospital room?: Total Help needed climbing 3-5 steps with a railing? : Total 6 Click Score: 7    End of Session Equipment Utilized During Treatment: Gait belt Activity Tolerance: Patient tolerated treatment well Patient left: in chair;with call bell/phone within reach;with chair alarm set;with family/visitor present Nurse Communication: Mobility status PT Visit Diagnosis: Unsteadiness on feet (R26.81);Other abnormalities of gait and mobility (R26.89);Muscle weakness (generalized) (M62.81);Difficulty in walking, not elsewhere classified (R26.2);Hemiplegia and hemiparesis;Pain Hemiplegia - Right/Left: Left Hemiplegia - dominant/non-dominant: Non-dominant Hemiplegia - caused by: Cerebral infarction Pain - Right/Left: Left Pain - part of body: Ankle and joints of foot;Leg     Time: 1450-1525 PT Time Calculation (min) (ACUTE ONLY): 35 min  Charges:  $Therapeutic Activity: 23-37 mins                    G Codes:       Earney Navy, PTA Pager: [phone]     Darliss Cheney 07/03/2017, 4:23 PM

## 2017-07-03 NOTE — Progress Notes (Addendum)
  Progress Note    07/03/2017 8:00 AM 6 Days Post-Op  Subjective:  No new complaints  Vitals:   07/03/17 0000 07/03/17 0400  BP: (!) 123/59 (!) 104/53  Pulse: 99 99  Resp: (!) 25 20  Temp: 98.6 F (37 C) 98.8 F (37.1 C)  SpO2: 98% 100%    Physical Exam: aaox3 Right leg incisions cdi Palpable right dp Left leg with epidermolysis, leg is warm to below knee, wound vacs to suction  CBC    Component Value Date/Time   WBC 15.4 (H) 07/03/2017 0306   RBC 3.17 (L) 07/03/2017 0306   HGB 7.8 (L) 07/03/2017 0306   HCT 24.3 (L) 07/03/2017 0306   PLT 281 07/03/2017 0306   MCV 76.7 (L) 07/03/2017 0306   MCH 24.6 (L) 07/03/2017 0306   MCHC 32.1 07/03/2017 0306   RDW 19.1 (H) 07/03/2017 0306    BMET    Component Value Date/Time   NA 136 07/01/2017 0621   K 4.1 07/01/2017 0621   CL 106 07/01/2017 0621   CO2 21 (L) 07/01/2017 0621   GLUCOSE 91 07/01/2017 0621   BUN <5 (L) 07/01/2017 0621   CREATININE 0.81 07/01/2017 0621   CALCIUM 7.0 (L) 07/01/2017 0621   GFRNONAA >60 07/01/2017 0621   GFRAA >60 07/01/2017 0621    INR    Component Value Date/Time   INR 1.14 06/29/2017 1044     Intake/Output Summary (Last 24 hours) at 07/03/17 0800 Last data filed at 07/03/17 0600  Gross per 24 hour  Intake          1779.52 ml  Output             3190 ml  Net         -1410.48 ml     Assessment:  66 y.o. female is thrombectomy bilateral lower extremities. Left foot non-viable  Plan: Discussed with patient and daughter via phone the expected outcomes which would be attempted left leg below knee amputation that could lead to above knee if there is inadequate blood flow and/or it fails to heal. I will re-visit later to discuss operative timing. Bmp and cpk ordered for this a.m..   Sathvik Tiedt C. Donzetta Matters, MD [facility] Office: [phone] Pager: [phone]  07/03/2017 8:00 AM   Addendum: patient agreeable to left bka on Wednesday or Thursday.  She is scheduled for Wednesday afternoon and will make NPO tomorrow night past midnight.   Servando Snare, MD

## 2017-07-03 NOTE — Progress Notes (Signed)
ANTICOAGULATION CONSULT NOTE - Follow Up Consult  Pharmacy Consult for heparin Indication: LE ischemia  Labs:  Recent Labs  06/30/17 0942 07/01/17 0621 07/02/17 0333 07/02/17 1313 07/02/17 1859 07/03/17 0306  HGB 8.2* 7.8* 7.7*  --   --  7.8*  HCT 25.6* 24.2* 24.0*  --   --  24.3*  PLT 279 274 298  --   --  281  HEPARINUNFRC  --  0.28* <0.10* 0.60 0.21* 0.57  CREATININE 0.89 0.81  --   --   --   --      Assessment: 66yo female now slightly above goal on heparin after rate increase.  Goal of Therapy:  Heparin level 0.3-0.5 units/ml   Plan:  Will decrease heparin gtt slightly to 1300 units/hr and check level in 6hr.  Wynona Neat, PharmD, BCPS  07/03/2017,4:08 AM

## 2017-07-03 NOTE — Consult Note (Signed)
Consultation Note Date: 07/03/2017   Patient Name: Anna Hayes  DOB: Feb 17, 1951  MRN: [id number]  Age / Sex: 66 y.o., female  PCP: Rochel Brome, MD Referring Physician: Annita Brod, MD  Reason for Consultation: Establishing goals of care and Psychosocial/spiritual support  HPI/Patient Profile: 66 y.o. female   admitted on 06/27/2017 with past medical history  significant for  CVA with left arm weakness present to [place] with a sbo after experincing symptoms for ten days.she went to OR on 9/24 had lysis of adhesions and repair of perforated bowel.  Post op she developed AKI with peak cr of 2.1 this stabalized with ivf.  On 9/27 she started developing some fevers and worsening leukocytosis.  ble u/s was done which was neg for DVT.  Ct scan of abd/pelvis showed a developing absess which was drained by IR on 10/1.  She was started on levaquin /flagyl on 9/27.  Blood cultures from 9/27 also growing out fungus therefore casopofugin was started on 10/1 after their doctor consulted with [facility].  On the evening around 9/30 she then developed an ischemic left limb.  A stat cta with run off was performed with showed bilateral occlusion to both legs.  Pt was then arranged to be transferred here to get emergent vascular surgery which she has gotten s/p thrombectomy of bilateral vessels(see vascular report) and left leg fasciotomy.  Pt has been on heparin drip at [place].   Patient with Status post thrombectomy of multiple bilateral vessels and left leg fasciotomy, she now faces decision related to amputation.   Clinical Assessment and Goals of Care:  This NP Wadie Lessen reviewed medical records, received report from team, assessed the patient and then meet at the patient's bedside along with her daughter/ [name]  to discuss diagnosis, prognosis, GOC,  and options.  Concept of Palliative Care was  discussed  This is a woman who tells me that 5 weeks ago she was totally independent and walking at the wise several times a week with the Silver sneakers.  This is a very difficult time for [name].  Created space and opportunity for patient and her daughter to verbalize thoughts, feelings, and fears regarding the current medical situation and pending decisions.  She expresses her fear regarding amputation but also that "I will do it if it means that I will live".  Her daughter and grandson are her main insensitive for life.  Patient understands that the decision regarding amputation needs to be made soon  A  discussion was had today regarding advanced directives.      Values and goals of care important to patient and family were attempted to be elicited.  Discussed with patient the importance of continued conversation with family and their  medical providers regarding overall plan of care and treatment options,  ensuring decisions are within the context of the patients values and GOCs.  Questions and concerns addressed.   Family encouraged to call with questions or concerns.  PMT will continue to support holistically.  NEXT OF KIN-no documented healthcare power of attorney the patient clearly verbalizes that her daughter [name] is her main support person and decision maker in the event the patient cannot make her own decisions    SUMMARY OF RECOMMENDATIONS    Code Status/Advance Care Planning:  Full code   Palliative Prophylaxis:   Bowel Regimen, Frequent Pain Assessment and Oral Care  Additional Recommendations (Limitations, Scope, Preferences):  Full Scope Treatment  Psycho-social/Spiritual:   Desire for further Chaplaincy support:no   Prognosis:   Unable to determine  Discharge Planning: To Be Determined      Primary Diagnoses: Present on Admission: . PAD (peripheral artery disease) ([place]) . Stroke ([place]) . Critical lower limb ischemia . SBO (small  bowel obstruction) ([place]) . Intraabdominal fluid collection absess . (Resolved) AKI (acute kidney injury) ([place]) . Fungemia   I have reviewed the medical record, interviewed the patient and family, and examined the patient. The following aspects are pertinent.  Past Medical History:  Diagnosis Date  . Frequent headaches   . Hypertension   . Muscle pain   . Palpitations   . Sinus problem   . Stroke [facility])    Social History   Social History  . Marital status: Widowed    Spouse name: N/A  . Number of children: N/A  . Years of education: N/A   Social History Main Topics  . Smoking status: Current Every Day Smoker    Packs/day: 0.50    Types: Cigarettes  . Smokeless tobacco: Never Used  . Alcohol use No  . Drug use: No  . Sexual activity: Not Asked   Other Topics Concern  . None   Social History Narrative  . None   History reviewed. No pertinent family history. Scheduled Meds: . amLODipine  5 mg Oral Daily  . docusate sodium  100 mg Oral Daily  . feeding supplement (ENSURE ENLIVE)  237 mL Oral BID BM  . gabapentin  300 mg Oral BID  . lisinopril  10 mg Oral Daily  . metroNIDAZOLE  500 mg Oral Q8H  . multivitamin with minerals  1 tablet Oral Daily  . pantoprazole  40 mg Oral Daily  . simvastatin  20 mg Oral q1800   Continuous Infusions: . sodium chloride    . sodium chloride    . cefTRIAXone (ROCEPHIN)  IV 2 g (07/03/17 0547)  . fluconazole (DIFLUCAN) IV Stopped (07/02/17 2338)  . heparin     PRN Meds:.sodium chloride, acetaminophen **OR** acetaminophen, alum & mag hydroxide-simeth, guaiFENesin-dextromethorphan, morphine injection, ondansetron, phenol Medications Prior to Admission:  Prior to Admission medications   Medication Sig Start Date End Date Taking? Authorizing Provider  amLODipine (NORVASC) 5 MG tablet Take 5 mg by mouth daily.  09/11/13  Yes [provider]  atenolol (TENORMIN) 50 MG tablet Take 25 mg by mouth daily.  08/24/13  Yes [provider]  betamethasone dipropionate (DIPROLENE) 0.05 % cream  04/27/14  Yes [provider]  butalbital-acetaminophen-caffeine (FIORICET, ESGIC) 50-325-40 MG per tablet Take 2 tablets by mouth every 6 (six) hours as needed for headache or migraine.  09/27/13  Yes [provider]  dicyclomine (BENTYL) 10 MG capsule Take 10 mg by mouth 3 (three) times daily before meals.  07/26/13  Yes [provider]  furosemide (LASIX) 20 MG tablet Take 20 mg by mouth daily.  12/17/13  Yes [provider]  gabapentin (NEURONTIN) 300 MG capsule Take 300 mg by mouth 3 (three) times daily.  08/26/13  Yes [provider]  levothyroxine (SYNTHROID, LEVOTHROID) 25 MCG tablet Take 25 mcg by mouth daily.  09/25/13  Yes [provider]  lisinopril (PRINIVIL,ZESTRIL) 10 MG tablet Take 10 mg by mouth daily.  07/22/13  Yes [provider]  omeprazole (PRILOSEC) 40 MG capsule Take 40 mg by mouth daily.  07/26/13  Yes [provider]  simvastatin (ZOCOR) 40 MG tablet Take 40 mg by mouth daily.  07/04/13  Yes [provider]   Allergies  Allergen Reactions  . Codeine   . Latex     Blisters  . Penicillins    Review of Systems  Physical Exam  Constitutional: She is oriented to person, place, and time. She appears well-developed.  HENT:  Mouth/Throat: Oropharynx is clear and moist.  Cardiovascular: Tachycardia present.   Pulmonary/Chest: Effort normal and breath sounds normal.  Neurological: She is alert and oriented to person, place, and time.  Skin: Skin is warm and dry.  -Noted skin changes per electronic medical records  -No EMR specific to left lower extremity    Vital Signs: BP (!) 119/95   Pulse (!) 102   Temp 99.3 F (37.4 C) (Oral)   Resp 12   Ht 5\' 2"  (1.575 m)   Wt 79.4 kg (175 lb)   SpO2 100%   BMI 32.01 kg/m  Pain Assessment: 0-10 POSS *See Group Information*: S-Acceptable,Sleep, easy to arouse Pain Score: 6     SpO2: SpO2: 100 % O2 Device:SpO2: 100 % O2 Flow Rate: .O2 Flow Rate (L/min): 2 L/min  IO: Intake/output summary:  Intake/Output Summary (Last 24 hours) at 07/03/17 1346 Last data filed at 07/03/17 1300  Gross per 24 hour  Intake          1139.52 ml  Output             2195 ml  Net         -1055.48 ml    LBM: Last BM Date: 07/02/17 Baseline Weight: Weight: 79.6 kg (175 lb 8 oz) Most recent weight: Weight: 79.4 kg (175 lb)     Palliative Assessment/Data: 30% currently     Time In: 1200 Time Out: 1315 Time Total: 75 min Greater than 50%  of this time was spent counseling and coordinating care related to the above assessment and plan.  Signed by: Wadie Lessen, NP   Please contact Palliative Medicine Team phone at [phone] for questions and concerns.  For individual provider: See Shea Evans

## 2017-07-03 NOTE — Care Management Important Message (Signed)
Important Message  Patient Details  Name: Anna Hayes MRN: [id number] Date of Birth: [birth year]   Medicare Important Message Given:  Yes    Nathen May 07/03/2017, 8:33 AM

## 2017-07-04 DIAGNOSIS — R000 Tachycardia, unspecified: Secondary | ICD-10-CM

## 2017-07-04 LAB — CBC
HEMATOCRIT: 24.3 % — AB (ref 36.0–46.0)
Hemoglobin: 7.9 g/dL — ABNORMAL LOW (ref 12.0–15.0)
MCH: 25.1 pg — ABNORMAL LOW (ref 26.0–34.0)
MCHC: 32.5 g/dL (ref 30.0–36.0)
MCV: 77.1 fL — ABNORMAL LOW (ref 78.0–100.0)
Platelets: 301 10*3/uL (ref 150–400)
RBC: 3.15 MIL/uL — ABNORMAL LOW (ref 3.87–5.11)
RDW: 19.2 % — AB (ref 11.5–15.5)
WBC: 15.3 10*3/uL — ABNORMAL HIGH (ref 4.0–10.5)

## 2017-07-04 LAB — BODY FLUID CULTURE
Culture: NO GROWTH
SPECIAL REQUESTS: NORMAL

## 2017-07-04 LAB — BASIC METABOLIC PANEL
ANION GAP: 7 (ref 5–15)
CALCIUM: 7.2 mg/dL — AB (ref 8.9–10.3)
CO2: 29 mmol/L (ref 22–32)
Chloride: 100 mmol/L — ABNORMAL LOW (ref 101–111)
Creatinine, Ser: 0.88 mg/dL (ref 0.44–1.00)
GFR calc Af Amer: >60 mL/min (ref >60)
GLUCOSE: 103 mg/dL — AB (ref 65–99)
Potassium: 3 mmol/L — ABNORMAL LOW (ref 3.5–5.1)
SODIUM: 136 mmol/L (ref 135–145)

## 2017-07-04 LAB — HEPARIN LEVEL (UNFRACTIONATED)
HEPARIN UNFRACTIONATED: 0.44 [IU]/mL (ref 0.30–0.70)
HEPARIN UNFRACTIONATED: 0.53 [IU]/mL (ref 0.30–0.70)
Heparin Unfractionated: 0.55 IU/mL (ref 0.30–0.70)

## 2017-07-04 LAB — MAGNESIUM: Magnesium: 1.7 mg/dL (ref 1.7–2.4)

## 2017-07-04 MED ORDER — POTASSIUM CHLORIDE CRYS ER 20 MEQ PO TBCR
40.0000 meq | EXTENDED_RELEASE_TABLET | Freq: Two times a day (BID) | ORAL | Status: AC
  Administered 2017-07-04 (×2): 40 meq via ORAL
  Filled 2017-07-04 (×2): qty 2

## 2017-07-04 NOTE — Progress Notes (Signed)
PROGRESS NOTE  Anna Hayes IWL:[id number] DOB: 11-26-1950 DOA: 06/27/2017 PCP: Rochel Brome, MD  HPI/Recap of past 39 hours: 66 year old woman presented with abdominal pain and was admitted to [facility] 9/22. Found to have small bowel obstruction. Underwent expiratory laparotomy with lysis of adhesions and release of small bowel obstruction, repair of small bowel perforation. Subsequently had fevers, leukocytosis and CT abdomen and pelvis 9/29 revealed pelvic abscess and underwent successful placement of percutaneous drain October 1. Also noted to have fungemia on 1/2 blood culture 9/27 and started on antifungal therapy. She subsequently developed left lower stomach pain and discomfort October 1 and underwent CT angiogram which revealed bilateral arterial blockage both lower extremities, was started on heparin and emergently transferred to [facility] where she underwent thrombectomy and fasciotomy October 2.  Since then, vascular surgery has felt that her left foot is nonviable and left BKA has been recommended, but patient is having difficulty making that decision. Palliative medicine was consulted and patient met with them and family today for goals of care discussion.  After this meeting, patient has opted for surgery.  No events overnight.  Patient still stressed about having to undergo amputation.she denies any pain or shortness of breath. Noted to be mildly tachycardic  Assessment/Plan: Critical left leg ischemia, status post bilateral femoral embolectomy, angioplasty left posterior tibial artery, fasciotomy left leg 10/2. -VVS managing. Left foot nonviable. Patient now amenable to left BKA. Vascular surgery looking at surgery on 10/10  Status post laparotomy, lysis of adhesions and repair of perforated bowel 9/24 at [facility]. -per general surgery. Wound VAC.    Abdominal abscess, status post percutaneous drain placement October 1. On treatment with Levaquin and Flagyl  since 9/27. -WBC slowly trending down. Continue abx as per ID  CT done 10/8 notes persistent fluid collections. ID to decide if fluconazole should go past 2 weeks.  Fungemia Candida tropicalis  -continue fluconazole per ID total 2 weeks.  ?longer -will need formal outpatient ophthalmologist dilated funduscopic exam to exclude fungal endolpthalmitis  Hypoalbuminemia.  -diet per surgery .  -supplements per dietician   Microcytic anemia, likely multifactorial including perioperative blood loss. S/p 2 units PRBC 10/2. -Hgb stable.    Obesity unspecified: meets criteria BMI greater than 30  Status post AKI: Now resolved  PMH stroke with left upper and left lower extremity weakness, left facial droop  Code Status: Full code   Family Communication: daughter at the bedside  Disposition Plan: based on recovery following amputation   Consultants: Vascular surgery General surgery Infectious disease Interventional radiology Physical medicine and rehabilitation   Procedures:  9/24: Exploratory laparoscopy with lysis of adhesions and release of small bowel obstruction, repair of small bowel perforation  10/2: Transfused 2 units packed red blood cells  10/1: Percutaneous drain placement and right buttock for pelvic abscess  10/2: Bilateral femoral popliteal embolectomy, left posterior tibial artery patch angioplasty and left lower leg fasciotomy   10/10: Planned amputation  Antimicrobials: Anidulafungin 10/2 >>10/3 Fluconazole 10/3 >> 10/17 Levaquin 10/2 >>  Metronidazole 10/2 >>   DVT prophylaxis:  Heparin   Objective: Vitals:   07/04/17 0400 07/04/17 0730 07/04/17 0904 07/04/17 1242  BP: (!) 122/59  130/73 120/69  Pulse: 91   (!) 102  Resp: _0 Temp: 100.3 F (37.9 C)  99 F (37.2 C) 98.4 F (36.9 C)  TempSrc: Oral  Oral Oral  SpO2: 100%  96% 100%  Weight:      Height:  Intake/Output Summary (Last 24 hours) at 07/04/17 1429 Last data  filed at 07/04/17 1200  Gross per 24 hour  Intake              803 ml  Output               20 ml  Net              783 ml   Filed Weights   06/27/17 1142 07/01/17 0526  Weight: 79.6 kg (175 lb 8 oz) 79.4 kg (175 lb)    Exam:   General:  Alert and oriented 3, no acute distress  HEENT: Normal cephalic atraumatic, mucous members are dry  Neck: Supple, no JVD   Cardiovascular:reg rhythm, borderline tachycardia   Respiratory: Clear auscultation bilaterally, breathing not labored   Abdomen: Soft, nontender, nondistended, positive bowel sounds   Musculoskeletal: No clubbing cyanosis, trace pitting edema   Skin: Left foot blackened, 2+ edema with notable blisters.  Psychiatry: Slightly depressed affect, no evidence of acute psychoses    Data Reviewed: CBC:  Recent Labs Lab 06/30/17 0942 07/01/17 0621 07/02/17 0333 07/03/17 0306 07/04/17 0029  WBC 20.5* 20.0* 16.3* 15.4* 15.3*  HGB 8.2* 7.8* 7.7* 7.8* 7.9*  HCT 25.6* 24.2* 24.0* 24.3* 24.3*  MCV 77.1* 77.3* 76.9* 76.7* 77.1*  PLT 279 274 298 281 622   Basic Metabolic Panel:  Recent Labs Lab 06/29/17 0500 06/30/17 0942 07/01/17 0621 07/03/17 1152 07/04/17 0029 07/04/17 1102  NA 136 138 136 135 136  --   K 4.4 3.3* 4.1 3.6 3.0*  --   CL 109 110 106 100* 100*  --   CO2 18* 21* 21* 25 29  --   GLUCOSE 53* 102* 91 87 103*  --   BUN 5* <5* <5* <5* <5*  --   CREATININE 0.90 0.89 0.81 0.76 0.88  --   CALCIUM 6.7* 6.9* 7.0* 7.3* 7.2*  --   MG  --   --  1.7  --   --  1.7   GFR: Estimated Creatinine Clearance: 59.7 mL/min (by C-G formula based on SCr of 0.88 mg/dL). Liver Function Tests:  Recent Labs Lab 06/27/17 1550 06/29/17 0500 07/01/17 0621  AST 44* 42* 32  ALT _0 ALKPHOS 47 51 47  BILITOT 0.4 0.7 0.6  PROT 4.3* 4.8* 5.2*  ALBUMIN 1.4* 1.7* 1.6*   No results for input(s): LIPASE, AMYLASE in the last 168 hours. No results for input(s): AMMONIA in the last 168 hours. Coagulation  Profile:  Recent Labs Lab 06/29/17 1044  INR 1.14   Cardiac Enzymes:  Recent Labs Lab 07/03/17 1152  CKTOTAL 3,206*   BNP (last 3 results) No results for input(s): PROBNP in the last 8760 hours. HbA1C: No results for input(s): HGBA1C in the last 72 hours. CBG: No results for input(s): GLUCAP in the last 168 hours. Lipid Profile: No results for input(s): CHOL, HDL, LDLCALC, TRIG, CHOLHDL, LDLDIRECT in the last 72 hours. Thyroid Function Tests: No results for input(s): TSH, T4TOTAL, FREET4, T3FREE, THYROIDAB in the last 72 hours. Anemia Panel: No results for input(s): VITAMINB12, FOLATE, FERRITIN, TIBC, IRON, RETICCTPCT in the last 72 hours. Urine analysis: No results found for: COLORURINE, APPEARANCEUR, LABSPEC, PHURINE, GLUCOSEU, HGBUR, BILIRUBINUR, KETONESUR, PROTEINUR, UROBILINOGEN, NITRITE, LEUKOCYTESUR Sepsis Labs: _1 (procalcitonin:4,lacticidven:4)  ) Recent Results (from the past 240 hour(s))  Culture, blood (Routine X 2) w Reflex to ID Panel     Status: Abnormal   Collection Time: 06/28/17 11:00 AM  Result Value Ref Range Status   Specimen Description BLOOD LEFT ARM  Final   Special Requests IN PEDIATRIC BOTTLE Blood Culture adequate volume  Final   Culture  Setup Time   Final    GRAM POSITIVE COCCI IN PEDIATRIC BOTTLE CRITICAL RESULT CALLED TO, READ BACK BY AND VERIFIED WITH: N. Batchelder Pharm.D. 9:55 06/29/17 ([email or web address])    Culture (A)  Final    STAPHYLOCOCCUS SPECIES (COAGULASE NEGATIVE) THE SIGNIFICANCE OF ISOLATING THIS ORGANISM FROM A SINGLE SET OF BLOOD CULTURES WHEN MULTIPLE SETS ARE DRAWN IS UNCERTAIN. PLEASE NOTIFY THE MICROBIOLOGY DEPARTMENT WITHIN ONE WEEK IF SPECIATION AND SENSITIVITIES ARE REQUIRED.    Report Status 07/01/2017 FINAL  Final  Blood Culture ID Panel (Reflexed)     Status: Abnormal   Collection Time: 06/28/17 11:00 AM  Result Value Ref Range Status   Enterococcus species NOT DETECTED NOT DETECTED Final   Listeria  monocytogenes NOT DETECTED NOT DETECTED Final   Staphylococcus species DETECTED (A) NOT DETECTED Final    Comment: Methicillin (oxacillin) resistant coagulase negative staphylococcus. Possible blood culture contaminant (unless isolated from more than one blood culture draw or clinical case suggests pathogenicity). No antibiotic treatment is indicated for blood  culture contaminants. CRITICAL RESULT CALLED TO, READ BACK BY AND VERIFIED WITH: N. Batchelder Pharm.D. 9:55 06/29/17 ([email or web address])    Staphylococcus aureus NOT DETECTED NOT DETECTED Final   Methicillin resistance DETECTED (A) NOT DETECTED Final    Comment: CRITICAL RESULT CALLED TO, READ BACK BY AND VERIFIED WITH: N. Batchelder Pharm.D. 9:55 06/29/17 ([email or web address])    Streptococcus species NOT DETECTED NOT DETECTED Final   Streptococcus agalactiae NOT DETECTED NOT DETECTED Final   Streptococcus pneumoniae NOT DETECTED NOT DETECTED Final   Streptococcus pyogenes NOT DETECTED NOT DETECTED Final   Acinetobacter baumannii NOT DETECTED NOT DETECTED Final   Enterobacteriaceae species NOT DETECTED NOT DETECTED Final   Enterobacter cloacae complex NOT DETECTED NOT DETECTED Final   Escherichia coli NOT DETECTED NOT DETECTED Final   Klebsiella oxytoca NOT DETECTED NOT DETECTED Final   Klebsiella pneumoniae NOT DETECTED NOT DETECTED Final   Proteus species NOT DETECTED NOT DETECTED Final   Serratia marcescens NOT DETECTED NOT DETECTED Final   Haemophilus influenzae NOT DETECTED NOT DETECTED Final   Neisseria meningitidis NOT DETECTED NOT DETECTED Final   Pseudomonas aeruginosa NOT DETECTED NOT DETECTED Final   Candida albicans NOT DETECTED NOT DETECTED Final   Candida glabrata NOT DETECTED NOT DETECTED Final   Candida krusei NOT DETECTED NOT DETECTED Final   Candida parapsilosis NOT DETECTED NOT DETECTED Final   Candida tropicalis NOT DETECTED NOT DETECTED Final  Culture, blood (Routine X 2) w Reflex to ID Panel     Status: None   Collection Time:  06/28/17 11:09 AM  Result Value Ref Range Status   Specimen Description BLOOD LEFT HAND  Final   Special Requests IN PEDIATRIC BOTTLE Blood Culture adequate volume  Final   Culture NO GROWTH 5 DAYS  Final   Report Status 07/03/2017 FINAL  Final  Body fluid culture     Status: None (Preliminary result)   Collection Time: 07/01/17  2:27 PM  Result Value Ref Range Status   Specimen Description FLUID PELVIS RIGHT  Final   Special Requests Normal  Final   Gram Stain   Final    FEW WBC PRESENT, PREDOMINANTLY MONONUCLEAR NO ORGANISMS SEEN    Culture NO GROWTH 3 DAYS  Final   Report Status PENDING  Incomplete      Studies: No  results found.  Scheduled Meds: . amLODipine  5 mg Oral Daily  . docusate sodium  100 mg Oral Daily  . feeding supplement (ENSURE ENLIVE)  237 mL Oral BID BM  . gabapentin  300 mg Oral BID  . lisinopril  10 mg Oral Daily  . metroNIDAZOLE  500 mg Oral Q8H  . multivitamin with minerals  1 tablet Oral Daily  . pantoprazole  40 mg Oral Daily  . simvastatin  20 mg Oral q1800    Continuous Infusions: . sodium chloride    . sodium chloride    . cefTRIAXone (ROCEPHIN)  IV Stopped (07/04/17 0618)  . fluconazole (DIFLUCAN) IV Stopped (07/04/17 0026)  . heparin 1,100 Units/hr (07/04/17 1428)     LOS: 7 days     Annita Brod, MD [facility] Pager [phone]  If 7PM-7AM, please contact night-coverage [email or web address] Password [email or web address] 07/04/2017, 2:29 PM

## 2017-07-04 NOTE — Progress Notes (Signed)
ANTICOAGULATION CONSULT NOTE - Follow Up Consult  Pharmacy Consult for heparin Indication: LE ischemia  Labs:  Recent Labs  07/02/17 0333  07/03/17 0306 07/03/17 1152 07/04/17 0029 07/04/17 0728  HGB 7.7*  --  7.8*  --  7.9*  --   HCT 24.0*  --  24.3*  --  24.3*  --   PLT 298  --  281  --  301  --   HEPARINUNFRC <0.10*  < > 0.57 0.58 0.53 0.44  CREATININE  --   --   --  0.76 0.88  --   CKTOTAL  --   --   --  3,206*  --   --   < > = values in this interval not displayed.   Assessment: 66yof s/p ex lap with lysis of adhesions and small bowel perforation repair on 9/24, admitted with ischemic left limb s/p bilateral femoral popliteal embolectomy and left leg fasciotomy on 10/2.   Heparin level over night came back slightly above goal, resulting in a rate reduction in the heparin infusion. Level this morning came back within goal range at 0.44 on 1100 units/hr. CBC remains stable. No infusion issues noted by nursing. No signs/symptoms of bleeding.   Goal of Therapy:  Heparin level 0.3-0.5 units/ml   Plan:  Continue heparin infusion at 1100 units/hr Check level in 6 hours Daily HL and CBC as needed  Doylene Canard, PharmD Clinical Pharmacist  Phone: [phone]  07/04/2017,8:55 AM

## 2017-07-04 NOTE — Progress Notes (Addendum)
[facility] with surgery left AKA.   Objective (!) 122/59 91 100.3 F (37.9 C) (Oral) 11 100%  Intake/Output Summary (Last 24 hours) at 07/04/17 0743 Last data filed at 07/04/17 0618  Gross per 24 hour  Intake              803 ml  Output               25 ml  Net              778 ml   Palpable right DP Left leg wound vac, no distal blood flow    Assessment/Planning: 66 y.o. female is thrombectomy bilateral lower extremities. Left foot non-viable Addendum: patient agreeable to left bka on Wednesday or Thursday. She is scheduled for Wednesday afternoon and will make NPO tomorrow night past midnight.   Laurence Slate [facility] 07/04/2017 7:43 AM --  Laboratory Lab Results:  Recent Labs  07/03/17 0306 07/04/17 0029  WBC 15.4* 15.3*  HGB 7.8* 7.9*  HCT 24.3* 24.3*  PLT 281 301   BMET  Recent Labs  07/03/17 1152 07/04/17 0029  NA 135 136  K 3.6 3.0*  CL 100* 100*  CO2 25 29  GLUCOSE 87 103*  BUN <5* <5*  CREATININE 0.76 0.88  CALCIUM 7.3* 7.2*    COAG Lab Results  Component Value Date   INR 1.14 06/29/2017   No results found for: PTT  I have independently interviewed and examined patient and agree with PA assessment and plan above. Npo past midnight for bka vs aka tomorrow.   Madalena Kesecker C. Donzetta Matters, MD [facility] [place] Office: [phone] Pager: [phone]

## 2017-07-04 NOTE — Progress Notes (Signed)
ANTICOAGULATION CONSULT NOTE - Follow Up Consult  Pharmacy Consult for heparin Indication: LE ischemia  Labs:  Recent Labs  07/02/17 0333  07/03/17 0306 07/03/17 1152 07/04/17 0029 07/04/17 0728 07/04/17 1614  HGB 7.7*  --  7.8*  --  7.9*  --   --   HCT 24.0*  --  24.3*  --  24.3*  --   --   PLT 298  --  281  --  301  --   --   HEPARINUNFRC <0.10*  < > 0.57 0.58 0.53 0.44 0.55  CREATININE  --   --   --  0.76 0.88  --   --   CKTOTAL  --   --   --  3,206*  --   --   --   < > = values in this interval not displayed.  Assessment: 66yof s/p ex lap with lysis of adhesions and small bowel perforation repair on 9/24, admitted with ischemic left limb s/p bilateral femoral popliteal embolectomy and left leg fasciotomy on 10/2.   Heparin level over night came back slightly above goal, resulting in a rate reduction in the heparin infusion. Level this morning came back within goal range at 0.44 on 1100 units/hr. CBC remains stable. No infusion issues noted by nursing. No signs/symptoms of bleeding.   Next heparin level slightly elevated at 0.55 compared to low goal.  Goal of Therapy:  Heparin level 0.3-0.5 units/ml   Plan:  Decreas heparin infusion slightly to 1,050 units/hr Check heparin level in AM Monitor daily heparin level, CBC, s/s of bleed  Elenor Quinones, PharmD, [facility] Clinical Pharmacist Pager [phone] 07/04/2017 5:21 PM

## 2017-07-04 NOTE — Progress Notes (Signed)
ANTICOAGULATION CONSULT NOTE - Follow Up Consult  Pharmacy Consult for heparin Indication: LE ischemia  Labs:  Recent Labs  07/01/17 0621 07/02/17 0333  07/03/17 0306 07/03/17 1152 07/04/17 0029  HGB 7.8* 7.7*  --  7.8*  --  7.9*  HCT 24.2* 24.0*  --  24.3*  --  24.3*  PLT 274 298  --  281  --  301  HEPARINUNFRC 0.28* <0.10*  < > 0.57 0.58 0.53  CREATININE 0.81  --   --   --  0.76 0.88  CKTOTAL  --   --   --   --  3,206*  --   < > = values in this interval not displayed.   Assessment: 66yo female remians slightly above goal on heparin after rate decrease.  Goal of Therapy:  Heparin level 0.3-0.5 units/ml   Plan:  Will decrease heparin gtt slightly to 1100 units/hr and check level in 6hr.  Wynona Neat, PharmD, BCPS  07/04/2017,1:32 AM

## 2017-07-04 NOTE — Progress Notes (Signed)
Occupational Therapy Treatment Patient Details Name: Anna Hayes MRN: [id number] DOB: 08-04-1951 Today's Date: 07/04/2017    History of present illness Pt is a 65 y.o. female with medical history significant of CVA with left arm weakness present to [place] with a sbo after experincing symptoms for ten days.she went to OR on 9/24 had lysis of adhesions and repair of perforated bowel.  Post op she developed AKI with peak cr of 2.1 this stabalized with ivf.  On 9/27 she started developing some fevers and worsening leukocytosis.  ble u/s was done which was neg for DVT.  Ct scan of abd/pelvis showed a developing absess which was drained by IR on 10/1.  She was started on levaquin /flagyl on 9/27.  Blood cultures from 9/27 also growing out fungus therefore casopofugin was started on 10/1 after their doctor consulted with [facility].  On the evening around 9/30 she then developed an ischemic left limb.  A stat cta with run off was performed with showed bilateral occlusion to both legs.  Pt was then arranged to be transferred here to get emergent vascular surgery which she has gotten s/p thrombectomy of bilateral vessels(see vascular report) and left leg fasciotomy.    OT comments  Pt demonstrating progress toward OT goals. Noted upcoming surgery and began education and practice for toilet transfers while maintaining NWB with LLE. Pt able to complete with max assist +2 this session and was able to power up utilizing R UE and R LE. Pt pulling up on therapist to obtain upright position. She remains very motivated to return to independence and activities at the [facility]. Will continue to follow while admitted and D/C recommendation remains appropriate.    Follow Up Recommendations  CIR;Supervision/Assistance - 24 hour    Equipment Recommendations  None recommended by OT    Recommendations for Other Services      Precautions / Restrictions Precautions Precautions: None Restrictions Weight Bearing  Restrictions: No       Mobility Bed Mobility Overal bed mobility: Needs Assistance Bed Mobility: Supine to Sit Rolling: Mod assist;+2 for physical assistance   Supine to sit: Mod assist;+2 for physical assistance;HOB elevated Sit to supine: Max assist   General bed mobility comments: Cues for sequencing and significantly increased time.   Transfers Overall transfer level: Needs assistance Equipment used: 2 person hand held assist Transfers: Squat Pivot Transfers Sit to Stand: Max assist;+2 physical assistance;+2 safety/equipment   Squat pivot transfers: Max assist;+2 safety/equipment;+2 physical assistance     General transfer comment: Practicing sit<>stand while maintaining L LE NWB in preparation for toilet transfers after upcoming sugery. Able to assist with power up with R UE and LE.     Balance Overall balance assessment: Needs assistance Sitting-balance support: Feet supported;Single extremity supported Sitting balance-Leahy Scale: Good Sitting balance - Comments: Supervision for safety.                                    ADL either performed or assessed with clinical judgement   ADL Overall ADL's : Needs assistance/impaired                         Toilet Transfer: Maximal assistance;+2 for physical assistance Toilet Transfer Details (indicate cue type and reason): Beginning to practice use of R LE only for transfers as pt scheduled for L LE amputation  General ADL Comments: Pt remains motivated to participate.      Vision   Vision Assessment?: No apparent visual deficits Additional Comments: Reports blurry vision when initially sat up. Resolved when dizziness resolved.    Perception     Praxis      Cognition Arousal/Alertness: Awake/alert Behavior During Therapy: WFL for tasks assessed/performed Overall Cognitive Status: Within Functional Limits for tasks assessed                                           Exercises     Shoulder Instructions       General Comments      Pertinent Vitals/ Pain       Pain Assessment: Faces Faces Pain Scale: Hurts little more Pain Location: Lt LE   Pain Descriptors / Indicators: Aching;Grimacing;Throbbing Pain Intervention(s): Limited activity within patient's tolerance;Monitored during session;Repositioned  Home Living                                          Prior Functioning/Environment              Frequency  Min 2X/week        Progress Toward Goals  OT Goals(current goals can now be found in the care plan section)  Progress towards OT goals: Progressing toward goals  Acute Rehab OT Goals Patient Stated Goal: feel better OT Goal Formulation: With patient Time For Goal Achievement: 07/12/17 Potential to Achieve Goals: Good  Plan Discharge plan remains appropriate    Co-evaluation    PT/OT/SLP Co-Evaluation/Treatment: Yes Reason for Co-Treatment: For patient/therapist safety;To address functional/ADL transfers PT goals addressed during session: Mobility/safety with mobility;Strengthening/ROM OT goals addressed during session: ADL's and self-care      AM-PAC PT "6 Clicks" Daily Activity     Outcome Measure   Help from another person eating meals?: A Little Help from another person taking care of personal grooming?: A Little Help from another person toileting, which includes using toliet, bedpan, or urinal?: A Lot Help from another person bathing (including washing, rinsing, drying)?: A Lot Help from another person to put on and taking off regular upper body clothing?: A Lot Help from another person to put on and taking off regular lower body clothing?: Total 6 Click Score: 13    End of Session Equipment Utilized During Treatment: Gait belt  OT Visit Diagnosis: Unsteadiness on feet (R26.81);Pain Pain - Right/Left: Left Pain - part of body: Leg (abdomen)   Activity Tolerance Patient tolerated  treatment well   Patient Left in bed;with call bell/phone within reach;with family/visitor present   Nurse Communication          Time: 7412-8786 OT Time Calculation (min): 33 min  Charges: OT General Charges $OT Visit: 1 Visit OT Treatments $Self Care/Home Management : 8-22 mins  Norman Herrlich, MS OTR/L  Pager: [place] 07/04/2017, 2:32 PM

## 2017-07-04 NOTE — Progress Notes (Signed)
Patient ID: Anna Hayes, female   DOB: [birth year], 66 y.o.   MRN: [id number]    Referring Physician(s): Dr. Donne Hazel  Supervising Physician: Aletta Edouard  Patient Status: [facility] - In-pt  Chief Complaint: Intra-abdominal abscess  Subjective: Patient with no new complaints today regarding her drain.  Allergies: Latex; Codeine; and Penicillins  Medications: Prior to Admission medications   Medication Sig Start Date End Date Taking? Authorizing Provider  amLODipine (NORVASC) 5 MG tablet Take 5 mg by mouth daily.  09/11/13  Yes [provider]  atenolol (TENORMIN) 50 MG tablet Take 25 mg by mouth daily.  08/24/13  Yes [provider]  betamethasone dipropionate (DIPROLENE) 0.05 % cream  04/27/14  Yes [provider]  butalbital-acetaminophen-caffeine (FIORICET, ESGIC) 50-325-40 MG per tablet Take 2 tablets by mouth every 6 (six) hours as needed for headache or migraine.  09/27/13  Yes [provider]  dicyclomine (BENTYL) 10 MG capsule Take 10 mg by mouth 3 (three) times daily before meals.  07/26/13  Yes [provider]  furosemide (LASIX) 20 MG tablet Take 20 mg by mouth daily.  12/17/13  Yes [provider]  gabapentin (NEURONTIN) 300 MG capsule Take 300 mg by mouth 3 (three) times daily.  08/26/13  Yes [provider]  levothyroxine (SYNTHROID, LEVOTHROID) 25 MCG tablet Take 25 mcg by mouth daily.  09/25/13  Yes [provider]  lisinopril (PRINIVIL,ZESTRIL) 10 MG tablet Take 10 mg by mouth daily.  07/22/13  Yes [provider]  omeprazole (PRILOSEC) 40 MG capsule Take 40 mg by mouth daily.  07/26/13  Yes [provider]  simvastatin (ZOCOR) 40 MG tablet Take 40 mg by mouth daily.  07/04/13  Yes [provider]    Vital Signs: BP 130/73 (BP Location: Right Arm)   Pulse 91   Temp 99 F (37.2 C) (Oral)   Resp 15   Ht 5\' 2"  (1.575 m)   Wt 175 lb (79.4 kg)   SpO2 96%   BMI 32.01  kg/m   Physical Exam: Abd: right TG drain in place with slightly cloudy serosang type output.  5cc documented yesterday.  Site is c/d/i  Imaging: Ct Abdomen Pelvis W Contrast  Addendum Date: 07/03/2017   ADDENDUM REPORT: 07/03/2017 14:20 ADDENDUM: Small amount of fluid around the right common femoral vessels compatible with recent surgery. No significant lymph node enlargement in the abdomen or pelvis. Electronically Signed   By: Markus Daft M.D.   On: 07/03/2017 14:20   Result Date: 07/03/2017 CLINICAL DATA:  Abdominal pain and fever. History of pelvic fluid collection and status post percutaneous drain. EXAM: CT ABDOMEN AND PELVIS WITH CONTRAST TECHNIQUE: Multidetector CT imaging of the abdomen and pelvis was performed using the standard protocol following bolus administration of intravenous contrast. CONTRAST:  169mL ISOVUE-300 IOPAMIDOL (ISOVUE-300) INJECTION 61% COMPARISON:  06/24/2017 and 06/26/2017 FINDINGS: Lower chest: Mild atelectasis at the lung bases. No pleural effusions. Coronary artery calcifications. Hepatobiliary: Normal appearance of the liver, gallbladder and portal venous system. No biliary dilatation. Pancreas: Normal appearance of the pancreas without inflammation or duct dilatation. Spleen: Normal appearance of spleen without enlargement. Adrenals/Urinary Tract: Stable nodularity in the left adrenal gland. Right adrenal tissue is unremarkable. Low-density structure in the right kidney upper pole likely represents a cyst. This cyst is slightly more dense than would be expected but no significantly change in Hounsfield units on the delayed imaging. No hydronephrosis. Small focus of gas within the urinary bladder. Stomach/Bowel: Normal appearance of stomach.  Decreased distension of small bowel loops containing fluid in left abdomen. No evidence for a high-grade bowel obstruction. Evidence for an appendectomy. Vascular/Lymphatic: Atherosclerotic calcifications in the aorta and iliac  arteries without aneurysm. There appears to be blood flow in the main visceral arteries. Portal venous system and main mesenteric veins are patent. IVC and renal veins are patent. Iliac veins appear to be patent. Reproductive: Uterus has been removed. Again noted is a small cyst or follicle in the right ovary. Pelvic fluid collection is adjacent to left adnexa. Other: Again noted is a right transgluteal pigtail drain within a small pelvic fluid collection. The pelvic fluid collection has decreased in size measuring 2.6 x 5.2 cm and previously measured 3.3 x 8.0 cm. This collection is adjacent to the left adnexa. Again noted is a fluid collection in the anterior lower abdomen which measures 7.4 x 2.9 cm on sequence 3, image 60, previously measured 10.0 x 3.4 cm. The anterior abdominal incision has been opened and the patient now has a wound VAC in this area. There is extensive edematous tissue or fat between the wound VAC and this anterior fluid collection. Again noted is a small amount of fluid just anterior to the left psoas muscle on sequence 3, image 58. Again noted is a small amount of soft tissue gas along the anterior abdominal incision on sequence 3, image 44. There is a fluid collection along the right side of lower abdomen on sequence 3, image 69 that could be associated with bowel. Musculoskeletal: No acute bone abnormality. There is fluid collection just anterior to the left common femoral vessels on sequence 3, image 84. This collection measures 3.4 x 2.7 cm and compatible with recent surgery. There is a small air-fluid collection just below the skin surface in the left inguinal region which is likely postoperative as well. Subcutaneous edema in the abdomen and pelvis. IMPRESSION: Pelvic abscess collection containing a percutaneous drain has decreased in size but there is residual fluid in this collection. There is a residual fluid collection or abscess in the anterior lower abdomen that has decreased in  size. Postsurgical changes with a wound VAC in the anterior lower abdomen. Probable right renal cyst. Small amount of gas in the urinary bladder that could be iatrogenic. Electronically Signed: By: Markus Daft M.D. On: 07/03/2017 13:46    Labs:  CBC:  Recent Labs  07/01/17 0621 07/02/17 0333 07/03/17 0306 07/04/17 0029  WBC 20.0* 16.3* 15.4* 15.3*  HGB 7.8* 7.7* 7.8* 7.9*  HCT 24.2* 24.0* 24.3* 24.3*  PLT 274 298 281 301    COAGS:  Recent Labs  06/27/17 1803 06/28/17 0752 06/29/17 1044  INR  --   --  1.14  APTT 155* 70* 49*    BMP:  Recent Labs  06/30/17 0942 07/01/17 0621 07/03/17 1152 07/04/17 0029  NA 138 136 135 136  K 3.3* 4.1 3.6 3.0*  CL 110 106 100* 100*  CO2 21* 21* 25 29  GLUCOSE 102* 91 87 103*  BUN <5* <5* <5* <5*  CALCIUM 6.9* 7.0* 7.3* 7.2*  CREATININE 0.89 0.81 0.76 0.88  GFRNONAA >60 >60 >60 >60  GFRAA >60 >60 >60 >60    LIVER FUNCTION TESTS:  Recent Labs  06/27/17 1550 06/29/17 0500 07/01/17 0621  BILITOT 0.4 0.7 0.6  AST 44* 42* 32  ALT 22 20 17   ALKPHOS 47 51 47  PROT 4.3* 4.8* 5.2*  ALBUMIN 1.4* 1.7* 1.6*    Assessment and Plan: 1. IAA, s/p perc  drain CX from [place] are not available so unsure whether this grew anything or not.  CT still shows fluid around the drain. Cont with flushing the drain Will d/w MD regarding drain to make sure no further manipulation is warranted.  Electronically Signed: Henreitta Cea 07/04/2017, 11:33 AM   I spent a total of 15 Minutes at the the patient's bedside AND on the patient's hospital floor or unit, greater than 50% of which was counseling/coordinating care for intra-abdominal abscess

## 2017-07-04 NOTE — Progress Notes (Signed)
Physical Therapy Treatment Patient Details Name: Anna Hayes MRN: [id number] DOB: 1951/05/12 Today's Date: 07/04/2017    History of Present Illness Pt is a 66 y.o. female with medical history significant of CVA with left arm weakness present to [place] with a sbo after experincing symptoms for ten days.she went to OR on 9/24 had lysis of adhesions and repair of perforated bowel.  Post op she developed AKI with peak cr of 2.1 this stabalized with ivf.  On 9/27 she started developing some fevers and worsening leukocytosis.  ble u/s was done which was neg for DVT.  Ct scan of abd/pelvis showed a developing absess which was drained by IR on 10/1.  She was started on levaquin /flagyl on 9/27.  Blood cultures from 9/27 also growing out fungus therefore casopofugin was started on 10/1 after their doctor consulted with [facility].  On the evening around 9/30 she then developed an ischemic left limb.  A stat cta with run off was performed with showed bilateral occlusion to both legs.  Pt was then arranged to be transferred here to get emergent vascular surgery which she has gotten s/p thrombectomy of bilateral vessels(see vascular report) and left leg fasciotomy.     PT Comments    Patient tolerated OOB transfers well and reported feeling better today versus yesterday. This session focused on functional transfers while trying to maintain NWB L LE in preparation for upcoming L LE surgery. Pt required mod/max A +2 for mobility and is motivated to participate in therapy. Current plan remains appropriate.    Follow Up Recommendations  CIR     Equipment Recommendations  Other (comment) (to be determined at next venue)    Recommendations for Other Services Rehab consult     Precautions / Restrictions Precautions Precautions: None Restrictions Weight Bearing Restrictions: No    Mobility  Bed Mobility Overal bed mobility: Needs Assistance Bed Mobility: Supine to Sit     Supine to sit: Mod  assist;+2 for physical assistance;HOB elevated     General bed mobility comments: cues for sequencing; assist to scoot hips toward EOB with bed pad and to elevate trunk into sitting  Transfers Overall transfer level: Needs assistance Equipment used: 2 person hand held assist Transfers: Squat Pivot Transfers Sit to Stand: Max assist;+2 physical assistance;+2 safety/equipment   Squat pivot transfers: Max assist;+2 safety/equipment;+2 physical assistance     General transfer comment: practiced sit to stand transfer initially while maintaining L LE NWB in preparation for upcoming surgery then transferred to recliner; cues for sequencing, R hand placement, and anterior translation of trunk; pt was able to power up with use of R UE/LE with L UE/LE protected by therapist  Ambulation/Gait                 Stairs            Wheelchair Mobility    Modified Rankin (Stroke Patients Only)       Balance Overall balance assessment: Needs assistance Sitting-balance support: Feet supported;Single extremity supported Sitting balance-Leahy Scale: Good                                      Cognition Arousal/Alertness: Awake/alert Behavior During Therapy: WFL for tasks assessed/performed Overall Cognitive Status: Within Functional Limits for tasks assessed  Exercises      General Comments        Pertinent Vitals/Pain Pain Assessment: Faces Faces Pain Scale: Hurts little more Pain Location: Lt LE   Pain Descriptors / Indicators: Aching;Grimacing;Throbbing Pain Intervention(s): Limited activity within patient's tolerance;Monitored during session;Repositioned    Home Living                      Prior Function            PT Goals (current goals can now be found in the care plan section) Acute Rehab PT Goals Patient Stated Goal: feel better PT Goal Formulation: With patient Time For Goal  Achievement: 07/12/17 Potential to Achieve Goals: Good Progress towards PT goals: Progressing toward goals    Frequency    Min 3X/week      PT Plan Current plan remains appropriate    Co-evaluation PT/OT/SLP Co-Evaluation/Treatment: Yes Reason for Co-Treatment: For patient/therapist safety;To address functional/ADL transfers PT goals addressed during session: Mobility/safety with mobility;Strengthening/ROM        AM-PAC PT "6 Clicks" Daily Activity  Outcome Measure  Difficulty turning over in bed (including adjusting bedclothes, sheets and blankets)?: Unable Difficulty moving from lying on back to sitting on the side of the bed? : Unable Difficulty sitting down on and standing up from a chair with arms (e.g., wheelchair, bedside commode, etc,.)?: Unable Help needed moving to and from a bed to chair (including a wheelchair)?: A Lot Help needed walking in hospital room?: Total Help needed climbing 3-5 steps with a railing? : Total 6 Click Score: 7    End of Session Equipment Utilized During Treatment: Gait belt Activity Tolerance: Patient tolerated treatment well Patient left: in chair;with call bell/phone within reach;with chair alarm set Nurse Communication: Mobility status PT Visit Diagnosis: Unsteadiness on feet (R26.81);Other abnormalities of gait and mobility (R26.89);Muscle weakness (generalized) (M62.81);Difficulty in walking, not elsewhere classified (R26.2);Hemiplegia and hemiparesis;Pain Hemiplegia - Right/Left: Left Hemiplegia - dominant/non-dominant: Non-dominant Hemiplegia - caused by: Cerebral infarction Pain - Right/Left: Left Pain - part of body: Ankle and joints of foot;Leg     Time: 9628-3662 PT Time Calculation (min) (ACUTE ONLY): 33 min  Charges:  $Therapeutic Activity: 8-22 mins                    G Codes:       Earney Navy, PTA Pager: [phone]     Darliss Cheney 07/04/2017, 12:25 PM

## 2017-07-04 NOTE — Progress Notes (Signed)
[facility] for Infectious Disease  Date of Admission:  06/27/2017     Days of Antibiotics 13  Ceftriaxone  Fluconazole  Flagyl  ASSESSMENT: Agreeable to AKA of left foot. Working with PMT to help with adjustment and comfort. Temp 100.9 deg last PM. WBC holding around 15k.   Fungemia  C. Tropicalis 9/27 BCx @ [facility] 1/2             06/26/17 BCx at [place] >> NG final              06/28/17 BCx >> 1/2 MRSE contaminant               Intra-abdominal/pelvic abscess s/p ex lap and percutaneous drain  Cx with GNR none predominate  RLQ percutaneous drain in place with minimal pink thin drainage. A  ABD VAC with serosanguinous drainage in collection chamber  Left Adnexal Pelvic fluid collection >> decreased to 2.6 x 5.2 cm (previously 3.3 x 8.0 cm)  Anterior lower abd fluid collection >> 7.4 x 2.9 cm (previously 10.0 x 3.4 cm)   Ischemic Leg, s/p revascularization and fasciotomy  AKA scheduled 07/05/17 as this is non-viable               PCN allergy - swelling / hives tolerant of Cephalosporins  PLAN: 1. Will change Fluconazole to PO after her surgery tomorrow.  2. Continue Flagyl + Ceftriaxone for intra-abdominal process. 3. Presumably fungemic due to intra-abdominal/pelvic process. Per CT scan yesterday still with 2 residual sizeable fluid collections - may need longer tx with Fluconazole should it be present longer than 2 weeks. Will D/W Dr. Tommy Medal.   Principal Problem:   Critical lower limb ischemia Active Problems:   PAD (peripheral artery disease) (HCC)   Stroke (HCC)   SBO (small bowel obstruction) (HCC)   Intraabdominal fluid collection absess   Fungemia   Abdominal pain   History of CVA with residual deficit   Tobacco abuse   Acute blood loss anemia   Post-operative pain   Leukocytosis   Ischemic neuropathy of left foot   DNR (do not resuscitate) discussion   Palliative care by  specialist   . amLODipine  5 mg Oral Daily  . docusate sodium  100 mg Oral Daily  . feeding supplement (ENSURE ENLIVE)  237 mL Oral BID BM  . gabapentin  300 mg Oral BID  . lisinopril  10 mg Oral Daily  . metroNIDAZOLE  500 mg Oral Q8H  . multivitamin with minerals  1 tablet Oral Daily  . pantoprazole  40 mg Oral Daily  . simvastatin  20 mg Oral q1800    SUBJECTIVE: Feeling better today. Abdomen still a bit tender. Now accepting of amputation and looking forward to prosthetics and getting back to the [facility] program. No fevers, chills, nausea, diarrhea.   Review of Systems: Review of Systems  Constitutional: Negative for chills and fever.  Respiratory: Negative for cough and sputum production.   Cardiovascular: Negative for chest pain.  Gastrointestinal: Negative for diarrhea, nausea and vomiting.  Genitourinary: Negative for dysuria.  Skin: Negative for rash.  Neurological: Negative for headaches.    Allergies  Allergen Reactions  . Codeine   . Latex     Blisters  . Penicillins     OBJECTIVE: Vitals:   07/04/17 0000 07/04/17 0400 07/04/17 0730 07/04/17 0904  BP: 113/61 (!) 122/59  130/73  Pulse: (!) 25 91    Resp:  14 11 16 15   Temp:  100.3 F (37.9 C)  99 F (37.2 C)  TempSrc: Oral Oral  Oral  SpO2: 90% 100%  96%  Weight:      Height:       Body mass index is 32.01 kg/m.  Physical Exam  Constitutional: She is oriented to person, place, and time.  Lying in bed. Getting ready to work with Physical Therapy to get out of chair.   HENT:  Mouth/Throat: Oropharynx is clear and moist.  Eyes: No scleral icterus.  Cardiovascular: Normal rate, regular rhythm and normal heart sounds.   No murmur heard. Pulmonary/Chest: Effort normal and breath sounds normal. No respiratory distress. She has no rales.  Abdominal: Soft. Bowel sounds are normal. She exhibits no distension.  VAC in place to abdomen   Musculoskeletal:  Left foot black/dark with bullae  to anterior shin. No pain.   Neurological: She is alert and oriented to person, place, and time.  Skin: Skin is warm and dry.  Psychiatric: Memory and affect normal.    Lab Results Lab Results  Component Value Date   WBC 15.3 (H) 07/04/2017   HGB 7.9 (L) 07/04/2017   HCT 24.3 (L) 07/04/2017   MCV 77.1 (L) 07/04/2017   PLT 301 07/04/2017    Lab Results  Component Value Date   CREATININE 0.88 07/04/2017   BUN <5 (L) 07/04/2017   NA 136 07/04/2017   K 3.0 (L) 07/04/2017   CL 100 (L) 07/04/2017   CO2 29 07/04/2017    Lab Results  Component Value Date   ALT 17 07/01/2017   AST 32 07/01/2017   ALKPHOS 47 07/01/2017   BILITOT 0.6 07/01/2017     Microbiology: Recent Results (from the past 240 hour(s))  Culture, blood (Routine X 2) w Reflex to ID Panel     Status: Abnormal   Collection Time: 06/28/17 11:00 AM  Result Value Ref Range Status   Specimen Description BLOOD LEFT ARM  Final   Special Requests IN PEDIATRIC BOTTLE Blood Culture adequate volume  Final   Culture  Setup Time   Final    GRAM POSITIVE COCCI IN PEDIATRIC BOTTLE CRITICAL RESULT CALLED TO, READ BACK BY AND VERIFIED WITH: N. Batchelder Pharm.D. 9:55 06/29/17 ([email or web address])    Culture (A)  Final    STAPHYLOCOCCUS SPECIES (COAGULASE NEGATIVE) THE SIGNIFICANCE OF ISOLATING THIS ORGANISM FROM A SINGLE SET OF BLOOD CULTURES WHEN MULTIPLE SETS ARE DRAWN IS UNCERTAIN. PLEASE NOTIFY THE MICROBIOLOGY DEPARTMENT WITHIN ONE WEEK IF SPECIATION AND SENSITIVITIES ARE REQUIRED.    Report Status 07/01/2017 FINAL  Final  Blood Culture ID Panel (Reflexed)     Status: Abnormal   Collection Time: 06/28/17 11:00 AM  Result Value Ref Range Status   Enterococcus species NOT DETECTED NOT DETECTED Final   Listeria monocytogenes NOT DETECTED NOT DETECTED Final   Staphylococcus species DETECTED (A) NOT DETECTED Final    Comment: Methicillin (oxacillin) resistant coagulase negative staphylococcus. Possible blood culture contaminant  (unless isolated from more than one blood culture draw or clinical case suggests pathogenicity). No antibiotic treatment is indicated for blood  culture contaminants. CRITICAL RESULT CALLED TO, READ BACK BY AND VERIFIED WITH: N. Batchelder Pharm.D. 9:55 06/29/17 ([email or web address])    Staphylococcus aureus NOT DETECTED NOT DETECTED Final   Methicillin resistance DETECTED (A) NOT DETECTED Final    Comment: CRITICAL RESULT CALLED TO, READ BACK BY AND VERIFIED WITH: N. Batchelder Pharm.D. 9:55 06/29/17 ([email or web address])    Streptococcus species NOT DETECTED NOT DETECTED  Final   Streptococcus agalactiae NOT DETECTED NOT DETECTED Final   Streptococcus pneumoniae NOT DETECTED NOT DETECTED Final   Streptococcus pyogenes NOT DETECTED NOT DETECTED Final   Acinetobacter baumannii NOT DETECTED NOT DETECTED Final   Enterobacteriaceae species NOT DETECTED NOT DETECTED Final   Enterobacter cloacae complex NOT DETECTED NOT DETECTED Final   Escherichia coli NOT DETECTED NOT DETECTED Final   Klebsiella oxytoca NOT DETECTED NOT DETECTED Final   Klebsiella pneumoniae NOT DETECTED NOT DETECTED Final   Proteus species NOT DETECTED NOT DETECTED Final   Serratia marcescens NOT DETECTED NOT DETECTED Final   Haemophilus influenzae NOT DETECTED NOT DETECTED Final   Neisseria meningitidis NOT DETECTED NOT DETECTED Final   Pseudomonas aeruginosa NOT DETECTED NOT DETECTED Final   Candida albicans NOT DETECTED NOT DETECTED Final   Candida glabrata NOT DETECTED NOT DETECTED Final   Candida krusei NOT DETECTED NOT DETECTED Final   Candida parapsilosis NOT DETECTED NOT DETECTED Final   Candida tropicalis NOT DETECTED NOT DETECTED Final  Culture, blood (Routine X 2) w Reflex to ID Panel     Status: None   Collection Time: 06/28/17 11:09 AM  Result Value Ref Range Status   Specimen Description BLOOD LEFT HAND  Final   Special Requests IN PEDIATRIC BOTTLE Blood Culture adequate volume  Final   Culture NO GROWTH 5 DAYS  Final   Report  Status 07/03/2017 FINAL  Final  Body fluid culture     Status: None (Preliminary result)   Collection Time: 07/01/17  2:27 PM  Result Value Ref Range Status   Specimen Description FLUID PELVIS RIGHT  Final   Special Requests Normal  Final   Gram Stain   Final    FEW WBC PRESENT, PREDOMINANTLY MONONUCLEAR NO ORGANISMS SEEN    Culture NO GROWTH 3 DAYS  Final   Report Status PENDING  Incomplete    Janene Madeira, MSN, NP-C [facility] Pager: [phone]  07/04/2017  9:47 AM

## 2017-07-04 NOTE — Op Note (Deleted)
    Patient name: Briselda Naval MRN: [id number] DOB: July 09, 1951 Sex: female  06/27/2017 Pre-operative Diagnosis: esrd, malfunctioning left arm avf Post-operative diagnosis:  Same Surgeon:  Eda Paschal. Donzetta Matters, MD Procedure Performed: 1.  US guided cannulation of left arm avf 2.  Left arm fistulogram  Indications:  66yo female with history end-stage renal disease has a left arm radiocephalic fistula since May of this year. He is now having difficulty with full runs on dialysis. He is now indicated for fistulogram.  Findings: Fistula itself is patent without any flow limiting stenosis. The antecubital space develops multiple branches to the deep system and has multiple runoff via the cephalic vein basilic vein and brachial veins centrally.  Should he continue to have malfunction of the fistula he would need conversion to an upper arm cephalic vein AV fistula with temporary tunneled dialysis catheter placement.    Procedure:  The patient was identified in the holding area and taken to room 8.  The patient was then placed supine on the table and prepped and draped in the usual sterile fashion.  A time out was called.  Ultrasound was used to evaluate the left arm AV fistula and this was cannulated with micropuncture needle and the sheath was placed. We then performed left under arm AV fistula gram with the above findings. Appropriate cuff was inflated and retrograde views were taken which demonstrated patent arterial anastomosis. No intervention was undertaken patient will be considered for left upper arm conversion with tunneled dialysis catheter placement should he continue have issues with dialysis.   Contrast: 35cc   Brandon C. Donzetta Matters, MD [facility] Office: [phone] Pager: [phone]

## 2017-07-05 ENCOUNTER — Encounter (HOSPITAL_COMMUNITY): Payer: Self-pay

## 2017-07-05 ENCOUNTER — Inpatient Hospital Stay (HOSPITAL_COMMUNITY): Payer: Medicare Other | Admitting: Anesthesiology

## 2017-07-05 ENCOUNTER — Encounter (HOSPITAL_COMMUNITY)
Admission: EM | Disposition: A | Discharge status: Rehabilitation Facility | Payer: Self-pay | Source: Other Acute Inpatient Hospital | Attending: Internal Medicine

## 2017-07-05 DIAGNOSIS — I96 Gangrene, not elsewhere classified: Secondary | ICD-10-CM

## 2017-07-05 HISTORY — PX: AMPUTATION: SHX166

## 2017-07-05 LAB — BASIC METABOLIC PANEL
ANION GAP: 8 (ref 5–15)
BUN: 5 mg/dL — ABNORMAL LOW (ref 6–20)
CALCIUM: 7.4 mg/dL — AB (ref 8.9–10.3)
CHLORIDE: 103 mmol/L (ref 101–111)
CO2: 26 mmol/L (ref 22–32)
Creatinine, Ser: 0.75 mg/dL (ref 0.44–1.00)
GFR calc non Af Amer: >60 mL/min (ref >60)
Glucose, Bld: 107 mg/dL — ABNORMAL HIGH (ref 65–99)
Potassium: 4.1 mmol/L (ref 3.5–5.1)
SODIUM: 137 mmol/L (ref 135–145)

## 2017-07-05 LAB — CBC
HCT: 24.7 % — ABNORMAL LOW (ref 36.0–46.0)
HEMOGLOBIN: 7.8 g/dL — AB (ref 12.0–15.0)
MCH: 24.5 pg — AB (ref 26.0–34.0)
MCHC: 31.6 g/dL (ref 30.0–36.0)
MCV: 77.7 fL — ABNORMAL LOW (ref 78.0–100.0)
Platelets: 317 10*3/uL (ref 150–400)
RBC: 3.18 MIL/uL — AB (ref 3.87–5.11)
RDW: 19.5 % — ABNORMAL HIGH (ref 11.5–15.5)
WBC: 14.5 10*3/uL — AB (ref 4.0–10.5)

## 2017-07-05 LAB — HEPARIN LEVEL (UNFRACTIONATED): Heparin Unfractionated: 0.54 IU/mL (ref 0.30–0.70)

## 2017-07-05 LAB — POCT ACTIVATED CLOTTING TIME: ACTIVATED CLOTTING TIME: 175 s

## 2017-07-05 LAB — HEMOGLOBIN AND HEMATOCRIT, BLOOD
HEMATOCRIT: 26 % — AB (ref 36.0–46.0)
HEMOGLOBIN: 8.2 g/dL — AB (ref 12.0–15.0)

## 2017-07-05 LAB — PROTIME-INR
INR: 1.31
PROTHROMBIN TIME: 16.2 s — AB (ref 11.4–15.2)

## 2017-07-05 LAB — MRSA PCR SCREENING: MRSA BY PCR: NEGATIVE

## 2017-07-05 SURGERY — AMPUTATION BELOW KNEE
Anesthesia: General | Site: Leg Upper | Laterality: Left

## 2017-07-05 MED ORDER — ONDANSETRON HCL 4 MG/2ML IJ SOLN
INTRAMUSCULAR | Status: AC
  Filled 2017-07-05: qty 4

## 2017-07-05 MED ORDER — HEPARIN (PORCINE) IN NACL 100-0.45 UNIT/ML-% IJ SOLN
600.0000 [IU]/h | INTRAMUSCULAR | Status: DC
  Administered 2017-07-05: 600 [IU]/h via INTRAVENOUS
  Filled 2017-07-05: qty 250

## 2017-07-05 MED ORDER — FENTANYL CITRATE (PF) 100 MCG/2ML IJ SOLN
INTRAMUSCULAR | Status: AC
  Administered 2017-07-05: 25 ug via INTRAVENOUS
  Filled 2017-07-05: qty 2

## 2017-07-05 MED ORDER — 0.9 % SODIUM CHLORIDE (POUR BTL) OPTIME
TOPICAL | Status: DC | PRN
  Administered 2017-07-05: 1000 mL

## 2017-07-05 MED ORDER — DEXTROSE 5 % IV SOLN
2.0000 g | INTRAVENOUS | Status: DC
  Administered 2017-07-06: 2 g via INTRAVENOUS
  Filled 2017-07-05: qty 2

## 2017-07-05 MED ORDER — ROCURONIUM BROMIDE 10 MG/ML (PF) SYRINGE
PREFILLED_SYRINGE | INTRAVENOUS | Status: AC
Start: 2017-07-05 — End: 2017-07-05
  Filled 2017-07-05: qty 5

## 2017-07-05 MED ORDER — FENTANYL CITRATE (PF) 250 MCG/5ML IJ SOLN
INTRAMUSCULAR | Status: AC
  Filled 2017-07-05: qty 5

## 2017-07-05 MED ORDER — LACTATED RINGERS IV SOLN
INTRAVENOUS | Status: DC
  Administered 2017-07-05 – 2017-07-11 (×2): via INTRAVENOUS

## 2017-07-05 MED ORDER — LACTATED RINGERS IV SOLN
INTRAVENOUS | Status: DC | PRN
  Administered 2017-07-05: 10:00:00 via INTRAVENOUS

## 2017-07-05 MED ORDER — ONDANSETRON HCL 4 MG/2ML IJ SOLN
INTRAMUSCULAR | Status: DC | PRN
  Administered 2017-07-05: 4 mg via INTRAVENOUS

## 2017-07-05 MED ORDER — HYDROMORPHONE HCL 1 MG/ML IJ SOLN
INTRAMUSCULAR | Status: AC
  Filled 2017-07-05: qty 0.5

## 2017-07-05 MED ORDER — MIDAZOLAM HCL 5 MG/5ML IJ SOLN
INTRAMUSCULAR | Status: DC | PRN
  Administered 2017-07-05: 2 mg via INTRAVENOUS

## 2017-07-05 MED ORDER — SUGAMMADEX SODIUM 200 MG/2ML IV SOLN
INTRAVENOUS | Status: AC
  Filled 2017-07-05: qty 2

## 2017-07-05 MED ORDER — PROPOFOL 10 MG/ML IV BOLUS
INTRAVENOUS | Status: AC
  Filled 2017-07-05: qty 20

## 2017-07-05 MED ORDER — DEXAMETHASONE SODIUM PHOSPHATE 10 MG/ML IJ SOLN
INTRAMUSCULAR | Status: AC
  Filled 2017-07-05: qty 2

## 2017-07-05 MED ORDER — HYDROMORPHONE HCL 1 MG/ML IJ SOLN
INTRAMUSCULAR | Status: DC | PRN
  Administered 2017-07-05: 0.5 mg via INTRAVENOUS

## 2017-07-05 MED ORDER — OXYCODONE-ACETAMINOPHEN 5-325 MG PO TABS
1.0000 | ORAL_TABLET | ORAL | Status: DC | PRN
  Administered 2017-07-05 – 2017-07-07 (×7): 2 via ORAL
  Administered 2017-07-08 – 2017-07-09 (×4): 1 via ORAL
  Administered 2017-07-10: 2 via ORAL
  Administered 2017-07-11 – 2017-07-12 (×4): 1 via ORAL
  Filled 2017-07-05: qty 2
  Filled 2017-07-05 (×2): qty 1
  Filled 2017-07-05 (×3): qty 2
  Filled 2017-07-05: qty 1
  Filled 2017-07-05: qty 2
  Filled 2017-07-05: qty 1
  Filled 2017-07-05: qty 2
  Filled 2017-07-05 (×2): qty 1
  Filled 2017-07-05 (×2): qty 2
  Filled 2017-07-05 (×3): qty 1

## 2017-07-05 MED ORDER — PHENYLEPHRINE HCL 10 MG/ML IJ SOLN
INTRAMUSCULAR | Status: DC | PRN
  Administered 2017-07-05: 80 ug via INTRAVENOUS

## 2017-07-05 MED ORDER — PROTAMINE SULFATE 10 MG/ML IV SOLN
INTRAVENOUS | Status: AC
  Filled 2017-07-05: qty 5

## 2017-07-05 MED ORDER — METRONIDAZOLE 500 MG PO TABS
500.0000 mg | ORAL_TABLET | Freq: Three times a day (TID) | ORAL | Status: DC
  Administered 2017-07-05 – 2017-07-12 (×21): 500 mg via ORAL
  Filled 2017-07-05 (×21): qty 1

## 2017-07-05 MED ORDER — PHENYLEPHRINE 40 MCG/ML (10ML) SYRINGE FOR IV PUSH (FOR BLOOD PRESSURE SUPPORT)
PREFILLED_SYRINGE | INTRAVENOUS | Status: AC
  Filled 2017-07-05: qty 10

## 2017-07-05 MED ORDER — FENTANYL CITRATE (PF) 100 MCG/2ML IJ SOLN
25.0000 ug | INTRAMUSCULAR | Status: DC | PRN
  Administered 2017-07-05 (×2): 25 ug via INTRAVENOUS

## 2017-07-05 MED ORDER — PHENYLEPHRINE HCL 10 MG/ML IJ SOLN
INTRAMUSCULAR | Status: AC
  Filled 2017-07-05: qty 1

## 2017-07-05 MED ORDER — LIDOCAINE HCL (CARDIAC) 20 MG/ML IV SOLN
INTRAVENOUS | Status: DC | PRN
  Administered 2017-07-05: 100 mg via INTRAVENOUS

## 2017-07-05 MED ORDER — PROPOFOL 10 MG/ML IV BOLUS
INTRAVENOUS | Status: DC | PRN
  Administered 2017-07-05: 150 mg via INTRAVENOUS

## 2017-07-05 MED ORDER — LIDOCAINE 2% (20 MG/ML) 5 ML SYRINGE
INTRAMUSCULAR | Status: AC
Start: 2017-07-05 — End: 2017-07-05
  Filled 2017-07-05: qty 5

## 2017-07-05 MED ORDER — DEXTROSE 5 % IV SOLN
INTRAVENOUS | Status: DC | PRN
  Administered 2017-07-05: 40 ug/min via INTRAVENOUS

## 2017-07-05 MED ORDER — DEXAMETHASONE SODIUM PHOSPHATE 10 MG/ML IJ SOLN
INTRAMUSCULAR | Status: DC | PRN
  Administered 2017-07-05: 8 mg via INTRAVENOUS

## 2017-07-05 MED ORDER — FENTANYL CITRATE (PF) 100 MCG/2ML IJ SOLN
INTRAMUSCULAR | Status: DC | PRN
  Administered 2017-07-05 (×2): 25 ug via INTRAVENOUS
  Administered 2017-07-05 (×2): 50 ug via INTRAVENOUS
  Administered 2017-07-05 (×2): 25 ug via INTRAVENOUS
  Administered 2017-07-05: 50 ug via INTRAVENOUS

## 2017-07-05 SURGICAL SUPPLY — 51 items
BANDAGE ACE 4X5 VEL STRL LF (GAUZE/BANDAGES/DRESSINGS) ×3 IMPLANT
BANDAGE ACE 6X5 VEL STRL LF (GAUZE/BANDAGES/DRESSINGS) ×3 IMPLANT
BANDAGE ESMARK 6X9 LF (GAUZE/BANDAGES/DRESSINGS) IMPLANT
BLADE SAW SAG 73X25 THK (BLADE) ×2
BLADE SAW SGTL 73X25 THK (BLADE) ×1 IMPLANT
BNDG CMPR 9X6 STRL LF SNTH (GAUZE/BANDAGES/DRESSINGS)
BNDG COHESIVE 6X5 TAN STRL LF (GAUZE/BANDAGES/DRESSINGS) ×3 IMPLANT
BNDG ESMARK 6X9 LF (GAUZE/BANDAGES/DRESSINGS)
BNDG GAUZE ELAST 4 BULKY (GAUZE/BANDAGES/DRESSINGS) ×6 IMPLANT
CANISTER SUCT 3000ML PPV (MISCELLANEOUS) ×3 IMPLANT
CANISTER WOUND CARE 500ML ATS (WOUND CARE) ×2 IMPLANT
CLIP VESOCCLUDE MED 6/CT (CLIP) IMPLANT
COVER BACK TABLE 60X90IN (DRAPES) IMPLANT
COVER SURGICAL LIGHT HANDLE (MISCELLANEOUS) ×3 IMPLANT
CUFF TOURNIQUET SINGLE 24IN (TOURNIQUET CUFF) IMPLANT
CUFF TOURNIQUET SINGLE 34IN LL (TOURNIQUET CUFF) ×2 IMPLANT
CUFF TOURNIQUET SINGLE 44IN (TOURNIQUET CUFF) IMPLANT
DRAIN CHANNEL 19F RND (DRAIN) IMPLANT
DRAPE HALF SHEET 40X57 (DRAPES) ×3 IMPLANT
DRAPE ORTHO SPLIT 77X108 STRL (DRAPES) ×6
DRAPE SURG ORHT 6 SPLT 77X108 (DRAPES) ×2 IMPLANT
DRSG ADAPTIC 3X8 NADH LF (GAUZE/BANDAGES/DRESSINGS) ×3 IMPLANT
DRSG VAC ATS MED SENSATRAC (GAUZE/BANDAGES/DRESSINGS) ×2 IMPLANT
ELECT REM PT RETURN 9FT ADLT (ELECTROSURGICAL) ×3
ELECTRODE REM PT RTRN 9FT ADLT (ELECTROSURGICAL) ×1 IMPLANT
EVACUATOR SILICONE 100CC (DRAIN) IMPLANT
GAUZE SPONGE 4X4 12PLY STRL (GAUZE/BANDAGES/DRESSINGS) ×6 IMPLANT
GLOVE BIO SURGEON STRL SZ7 (GLOVE) ×3 IMPLANT
GLOVE BIOGEL PI IND STRL 7.5 (GLOVE) ×1 IMPLANT
GLOVE BIOGEL PI INDICATOR 7.5 (GLOVE) ×2
GOWN STRL REUS W/ TWL LRG LVL3 (GOWN DISPOSABLE) ×3 IMPLANT
GOWN STRL REUS W/TWL LRG LVL3 (GOWN DISPOSABLE) ×9
KIT BASIN OR (CUSTOM PROCEDURE TRAY) ×3 IMPLANT
KIT ROOM TURNOVER OR (KITS) ×3 IMPLANT
NS IRRIG 1000ML POUR BTL (IV SOLUTION) ×3 IMPLANT
PACK GENERAL/GYN (CUSTOM PROCEDURE TRAY) ×3 IMPLANT
PAD ARMBOARD 7.5X6 YLW CONV (MISCELLANEOUS) ×6 IMPLANT
STAPLER VISISTAT 35W (STAPLE) ×6 IMPLANT
STOCKINETTE IMPERVIOUS LG (DRAPES) ×3 IMPLANT
SUT ETHILON 3 0 PS 1 (SUTURE) IMPLANT
SUT SILK 0 TIES 10X30 (SUTURE) IMPLANT
SUT SILK 2 0 (SUTURE) ×6
SUT SILK 2-0 18XBRD TIE 12 (SUTURE) ×1 IMPLANT
SUT SILK 3 0 (SUTURE) ×3
SUT SILK 3-0 18XBRD TIE 12 (SUTURE) ×1 IMPLANT
SUT VIC AB 2-0 CT1 18 (SUTURE) ×6 IMPLANT
SUT VIC AB 3-0 SH 18 (SUTURE) ×3 IMPLANT
TAPE UMBILICAL COTTON 1/8X30 (MISCELLANEOUS) ×3 IMPLANT
TOWEL GREEN STERILE (TOWEL DISPOSABLE) ×3 IMPLANT
UNDERPAD 30X30 (UNDERPADS AND DIAPERS) ×3 IMPLANT
WATER STERILE IRR 1000ML POUR (IV SOLUTION) ×3 IMPLANT

## 2017-07-05 NOTE — Anesthesia Preprocedure Evaluation (Addendum)
Anesthesia Evaluation  Patient identified by MRN, date of birth, ID band Patient awake    Reviewed: Allergy & Precautions, NPO status , Unable to perform ROS - Chart review only  Airway Mallampati: II  TM Distance: >3 FB Neck ROM: full    Dental  (+) Dental Advidsory Given, Poor Dentition, Edentulous Upper   Pulmonary Current Smoker,    breath sounds clear to auscultation       Cardiovascular hypertension, On Medications + Peripheral Vascular Disease   Rhythm:Regular Rate:Normal     Neuro/Psych  Headaches,  Neuromuscular disease CVA, Residual Symptoms    GI/Hepatic Neg liver ROS, GERD  Medicated and Controlled,  Endo/Other  negative endocrine ROS  Renal/GU negative Renal ROS     Musculoskeletal   Abdominal   Peds  Hematology  (+) anemia ,   Anesthesia Other Findings Weakness left arm  Reproductive/Obstetrics                            Anesthesia Physical Anesthesia Plan  ASA: III  Anesthesia Plan: General   Post-op Pain Management:    Induction: Intravenous  PONV Risk Score and Plan: 2 and Ondansetron and Dexamethasone  Airway Management Planned: Oral ETT  Additional Equipment:   Intra-op Plan:   Post-operative Plan: Extubation in OR  Informed Consent:   Dental Advisory Given  Plan Discussed with: Anesthesiologist and CRNA  Anesthesia Plan Comments:        Anesthesia Quick Evaluation

## 2017-07-05 NOTE — Consult Note (Addendum)
Tyler Nurse wound consult note Vascular team following for assessment and plan of care to left leg wound and vac dressing. Reason for Consult: Vac dressing change to abd wound, CCS PA at the bedside to assess wound appearance. Wound type: Full thickness post-op wound with sutures visible to inner wound bed Wound bed: Appearance unchanged since previous assessment.  14X13X3cm with 4 cm undermining to edges, 90% red and moist, 10% yellow Drainage (amount, consistency, odor) mod amt pink drainage in the cannister, no odor Periwound: Intact skin surrounding Dressing procedure/placement/frequency: Applied Mepitel contact layer, then one piece white foam, then one piece black foam. Pt medicated for pain prior to the procedure and tolerated with mod amt discomfort. WOC will plan to change abd Vac dressing Q M/W/F. Discussed plan of care and pt verbalized understanding. Julien Girt MSN, RN, [place], [place], [place]

## 2017-07-05 NOTE — Progress Notes (Signed)
ANTICOAGULATION CONSULT NOTE - Follow Up Consult  Pharmacy Consult for heparin Indication: LE ischemia  Labs:  Recent Labs  07/03/17 0306 07/03/17 1152 07/04/17 0029 07/04/17 0728 07/04/17 1614 07/05/17 0226  HGB 7.8*  --  7.9*  --   --  7.8*  HCT 24.3*  --  24.3*  --   --  24.7*  PLT 281  --  301  --   --  317  LABPROT  --   --   --   --   --  16.2*  INR  --   --   --   --   --  1.31  HEPARINUNFRC 0.57 0.58 0.53 0.44 0.55 0.54  CREATININE  --  0.76 0.88  --   --  0.75  CKTOTAL  --  3,206*  --   --   --   --     Assessment: 66yof s/p ex lap with lysis of adhesions and small bowel perforation repair on 9/24, admitted with ischemic left limb s/p bilateral femoral popliteal embolectomy and left leg fasciotomy on 10/2.   Heparin level last night was slightly above goal, resulting in rate reduction. Level this morning came back at 0.54, still slightly above goal range on 1050 units/hr. CBC continues to remain stable. No infusion issues noted by nursing. No signs/symptoms of bleeding.   Goal of Therapy:  Heparin level 0.3-0.5 units/ml   Plan:  Decrease heparin infusion to 1,000 units/hr Monitor daily heparin level, CBC, s/s of bleed Follow up plans for AKA   Doylene Canard, PharmD Clinical Pharmacist  Phone: [phone] 07/05/2017 8:19 AM

## 2017-07-05 NOTE — Anesthesia Postprocedure Evaluation (Signed)
Anesthesia Post Note  Patient: Shaunna Rosetti  Procedure(s) Performed: LEFT ABOVE KNEE AMPUTATION (Left Leg Upper)     Patient location during evaluation: PACU Anesthesia Type: General Level of consciousness: awake Pain management: pain level controlled Vital Signs Assessment: post-procedure vital signs reviewed and stable Respiratory status: spontaneous breathing Cardiovascular status: stable Anesthetic complications: no    Last Vitals:  Vitals:   07/05/17 1700 07/05/17 1734  BP: 134/77   Pulse: (!) 105   Resp: (!) 22   Temp:  36.8 C  SpO2: 97%     Last Pain:  Vitals:   07/05/17 1745  TempSrc:   PainSc: 8                  Deborah Dondero

## 2017-07-05 NOTE — Progress Notes (Signed)
ANTICOAGULATION CONSULT NOTE - Follow Up Consult  Pharmacy Consult for heparin Indication: LE ischemia  Labs:  Recent Labs  07/03/17 0306 07/03/17 1152 07/04/17 0029 07/04/17 0728 07/04/17 1614 07/05/17 0226  HGB 7.8*  --  7.9*  --   --  7.8*  HCT 24.3*  --  24.3*  --   --  24.7*  PLT 281  --  301  --   --  317  LABPROT  --   --   --   --   --  16.2*  INR  --   --   --   --   --  1.31  HEPARINUNFRC 0.57 0.58 0.53 0.44 0.55 0.54  CREATININE  --  0.76 0.88  --   --  0.75  CKTOTAL  --  3,206*  --   --   --   --     Assessment: 66yof s/p ex lap with lysis of adhesions and small bowel perforation repair on 9/24, admitted with ischemic left limb s/p bilateral femoral popliteal embolectomy and left leg fasciotomy on 10/2.   Heparin level this morning came back at 0.54, still slightly above goal range on 1050 units/hr. Heparin infusion was stopped at 1005 this morning prior to surgery. CBC continues to remain stable. No signs/symptoms of bleeding. Talked with Dr. Trula Slade about timing to restart heparin infusion and plan post-procedure.  Goal of Therapy:  Heparin level 0.3-0.5 units/ml   Plan:  Start heparin infusion at 600 units/hr starting tonight at 6pm  Will not titrate infusion until tomorrow after vascular surgery evaluates patient Monitor daily heparin level, CBC, s/s of bleed  Doylene Canard, PharmD Clinical Pharmacist  Phone: [phone] 07/05/2017 4:10 PM

## 2017-07-05 NOTE — Transfer of Care (Signed)
Immediate Anesthesia Transfer of Care Note  Patient: Anna Hayes  Procedure(s) Performed: LEFT ABOVE KNEE AMPUTATION (Left Leg Upper)  Patient Location: PACU  Anesthesia Type:General  Level of Consciousness: awake, alert  and oriented  Airway & Oxygen Therapy: Patient Spontanous Breathing and Patient connected to nasal cannula oxygen  Post-op Assessment: Report given to RN, Post -op Vital signs reviewed and stable and Patient moving all extremities X 4  Post vital signs: Reviewed and stable  Last Vitals:  Vitals:   07/05/17 0400 07/05/17 0717  BP:  130/72  Pulse:  89  Resp:  16  Temp: 37.1 C 36.8 C  SpO2: 96% 98%    Last Pain:  Vitals:   07/05/17 0717  TempSrc: Oral  PainSc:       Patients Stated Pain Goal: 0 (03/83/33 8329)  Complications: No apparent anesthesia complications

## 2017-07-05 NOTE — Progress Notes (Addendum)
PROGRESS NOTE  Henya Aguallo EFE:[id number] DOB: 10/30/1950 DOA: 06/27/2017 PCP: Rochel Brome, MD  HPI/Recap of past 32 hours: 66 year old woman presented with abdominal pain and was admitted to [facility] 9/22. Found to have small bowel obstruction. Underwent expiratory laparotomy with lysis of adhesions and release of small bowel obstruction, repair of small bowel perforation. Subsequently had fevers, leukocytosis and CT abdomen and pelvis 9/29 revealed pelvic abscess and underwent successful placement of percutaneous drain October 1. Also noted to have fungemia on 1/2 blood culture 9/27 and started on antifungal therapy. She subsequently developed left lower stomach pain and discomfort October 1 and underwent CT angiogram which revealed bilateral arterial blockage both lower extremities, was started on heparin and emergently transferred to [facility] where she underwent thrombectomy and fasciotomy October 2.  Since then, vascular surgery has felt that her left foot is nonviable and left BKA has been recommended, but patient is having difficulty making that decision. Palliative medicine was consulted and patient met with them and family today for goals of care discussion.  After this meeting, patient has opted for surgery.  Subjective;  Underwent surgery today.  She is alert. She was having pain Left LE , site surgery.   Assessment/Plan: Critical left leg ischemia, status post bilateral femoral embolectomy, angioplasty left posterior tibial artery, fasciotomy left leg 10/2. -VVS managing. Left foot nonviable. Patient now amenable to left BKA. Underwent surgery on 10/10 -repeat cbc tonight./  -Heparin per vascular.   Status post laparotomy, lysis of adhesions and repair of perforated bowel 9/24 at [facility]. -per general surgery. Wound VAC.   -now on ceftriaxone and flagyl.  -Follow Sx recommendation regarding care for wound vac  Abdominal abscess, status post percutaneous  drain placement October 1. On treatment with Levaquin and Flagyl since 9/27. -WBC slowly trending down. Continue abx as per ID  CT done 10/8 notes persistent fluid collections. ID to decide if fluconazole should go past 2 weeks. -now on ceftriaxone and flagyl.  -Follow Sx recommendation regarding care for wound vac. Repeated Ct ?  Fungemia Candida tropicalis  -continue fluconazole per ID total 2 weeks.  ?longer -will need formal outpatient ophthalmologist dilated funduscopic exam to exclude fungal endolpthalmitis  Hypoalbuminemia.  -diet per surgery .  -supplements per dietician   Microcytic anemia, likely multifactorial including perioperative blood loss. S/p 2 units PRBC 10/2. -Hgb stable.   repeat Hb post sx tonight.   Obesity unspecified: meets criteria BMI greater than 30  Status post AKI: Now resolved  PMH stroke with left upper and left lower extremity weakness, left facial droop  Code Status: Full code   Family Communication: daughter at the bedside  Disposition Plan: based on recovery following amputation   Consultants: Vascular surgery General surgery Infectious disease Interventional radiology Physical medicine and rehabilitation   Procedures:  9/24: Exploratory laparoscopy with lysis of adhesions and release of small bowel obstruction, repair of small bowel perforation  10/2: Transfused 2 units packed red blood cells  10/1: Percutaneous drain placement and right buttock for pelvic abscess  10/2: Bilateral femoral popliteal embolectomy, left posterior tibial artery patch angioplasty and left lower leg fasciotomy   10/10: Planned amputation  Antimicrobials: Anidulafungin 10/2 >>10/3 Fluconazole 10/3 >> 10/17 Levaquin 10/2 >>ceftriaxone   Metronidazole 10/2 >>   DVT prophylaxis:  Heparin   Objective: Vitals:   07/05/17 1350 07/05/17 1400 07/05/17 1405 07/05/17 1445  BP: 120/64  121/71 118/65  Pulse: 99 (!) 105 99 97  Resp: _0 13  Temp:    98.4 F (36.9 C)  TempSrc:    Oral  SpO2: 97% 97% 98% 99%  Weight:      Height:        Intake/Output Summary (Last 24 hours) at 07/05/17 1539 Last data filed at 07/05/17 1210  Gross per 24 hour  Intake           767.63 ml  Output              140 ml  Net           627.63 ml   Filed Weights   06/27/17 1142 07/01/17 0526  Weight: 79.6 kg (175 lb 8 oz) 79.4 kg (175 lb)    Exam:   General:  Alert in no acute distress.   HEENT: MMM  Neck: Supple, no JVD  Cardiovascular: S 1, S 2 RRR  Respiratory: CTA  Abdomen: Soft, NT, wound vac ion place.  Musculoskeletal; no cyanosis.   Skin: left ANK. With dressing.      Data Reviewed: CBC:  Recent Labs Lab 07/01/17 0621 07/02/17 0333 07/03/17 0306 07/04/17 0029 07/05/17 0226  WBC 20.0* 16.3* 15.4* 15.3* 14.5*  HGB 7.8* 7.7* 7.8* 7.9* 7.8*  HCT 24.2* 24.0* 24.3* 24.3* 24.7*  MCV 77.3* 76.9* 76.7* 77.1* 77.7*  PLT 274 298 281 301 443   Basic Metabolic Panel:  Recent Labs Lab 06/30/17 0942 07/01/17 0621 07/03/17 1152 07/04/17 0029 07/04/17 1102 07/05/17 0226  NA 138 136 135 136  --  137  K 3.3* 4.1 3.6 3.0*  --  4.1  CL 110 106 100* 100*  --  103  CO2 21* 21* 25 29  --  26  GLUCOSE 102* 91 87 103*  --  107*  BUN <5* <5* <5* <5*  --  5*  CREATININE 0.89 0.81 0.76 0.88  --  0.75  CALCIUM 6.9* 7.0* 7.3* 7.2*  --  7.4*  MG  --  1.7  --   --  1.7  --    GFR: Estimated Creatinine Clearance: 65.7 mL/min (by C-G formula based on SCr of 0.75 mg/dL). Liver Function Tests:  Recent Labs Lab 06/29/17 0500 07/01/17 0621  AST 42* 32  ALT 20 17  ALKPHOS 51 47  BILITOT 0.7 0.6  PROT 4.8* 5.2*  ALBUMIN 1.7* 1.6*   No results for input(s): LIPASE, AMYLASE in the last 168 hours. No results for input(s): AMMONIA in the last 168 hours. Coagulation Profile:  Recent Labs Lab 06/29/17 1044 07/05/17 0226  INR 1.14 1.31   Cardiac Enzymes:  Recent Labs Lab 07/03/17 1152  CKTOTAL 3,206*   BNP  (last 3 results) No results for input(s): PROBNP in the last 8760 hours. HbA1C: No results for input(s): HGBA1C in the last 72 hours. CBG: No results for input(s): GLUCAP in the last 168 hours. Lipid Profile: No results for input(s): CHOL, HDL, LDLCALC, TRIG, CHOLHDL, LDLDIRECT in the last 72 hours. Thyroid Function Tests: No results for input(s): TSH, T4TOTAL, FREET4, T3FREE, THYROIDAB in the last 72 hours. Anemia Panel: No results for input(s): VITAMINB12, FOLATE, FERRITIN, TIBC, IRON, RETICCTPCT in the last 72 hours. Urine analysis: No results found for: COLORURINE, APPEARANCEUR, [place], [place], GLUCOSEU, HGBUR, BILIRUBINUR, KETONESUR, PROTEINUR, UROBILINOGEN, NITRITE, LEUKOCYTESUR Sepsis Labs: _0 (procalcitonin:4,lacticidven:4)  ) Recent Results (from the past 240 hour(s))  Culture, blood (Routine X 2) w Reflex to ID Panel     Status: Abnormal   Collection Time: 06/28/17 11:00 AM  Result Value Ref Range Status  Specimen Description BLOOD LEFT ARM  Final   Special Requests IN PEDIATRIC BOTTLE Blood Culture adequate volume  Final   Culture  Setup Time   Final    GRAM POSITIVE COCCI IN PEDIATRIC BOTTLE CRITICAL RESULT CALLED TO, READ BACK BY AND VERIFIED WITH: N. Batchelder Pharm.D. 9:55 06/29/17 ([email or web address])    Culture (A)  Final    STAPHYLOCOCCUS SPECIES (COAGULASE NEGATIVE) THE SIGNIFICANCE OF ISOLATING THIS ORGANISM FROM A SINGLE SET OF BLOOD CULTURES WHEN MULTIPLE SETS ARE DRAWN IS UNCERTAIN. PLEASE NOTIFY THE MICROBIOLOGY DEPARTMENT WITHIN ONE WEEK IF SPECIATION AND SENSITIVITIES ARE REQUIRED.    Report Status 07/01/2017 FINAL  Final  Blood Culture ID Panel (Reflexed)     Status: Abnormal   Collection Time: 06/28/17 11:00 AM  Result Value Ref Range Status   Enterococcus species NOT DETECTED NOT DETECTED Final   Listeria monocytogenes NOT DETECTED NOT DETECTED Final   Staphylococcus species DETECTED (A) NOT DETECTED Final    Comment: Methicillin (oxacillin)  resistant coagulase negative staphylococcus. Possible blood culture contaminant (unless isolated from more than one blood culture draw or clinical case suggests pathogenicity). No antibiotic treatment is indicated for blood  culture contaminants. CRITICAL RESULT CALLED TO, READ BACK BY AND VERIFIED WITH: N. Batchelder Pharm.D. 9:55 06/29/17 ([email or web address])    Staphylococcus aureus NOT DETECTED NOT DETECTED Final   Methicillin resistance DETECTED (A) NOT DETECTED Final    Comment: CRITICAL RESULT CALLED TO, READ BACK BY AND VERIFIED WITH: N. Batchelder Pharm.D. 9:55 06/29/17 ([email or web address])    Streptococcus species NOT DETECTED NOT DETECTED Final   Streptococcus agalactiae NOT DETECTED NOT DETECTED Final   Streptococcus pneumoniae NOT DETECTED NOT DETECTED Final   Streptococcus pyogenes NOT DETECTED NOT DETECTED Final   Acinetobacter baumannii NOT DETECTED NOT DETECTED Final   Enterobacteriaceae species NOT DETECTED NOT DETECTED Final   Enterobacter cloacae complex NOT DETECTED NOT DETECTED Final   Escherichia coli NOT DETECTED NOT DETECTED Final   Klebsiella oxytoca NOT DETECTED NOT DETECTED Final   Klebsiella pneumoniae NOT DETECTED NOT DETECTED Final   Proteus species NOT DETECTED NOT DETECTED Final   Serratia marcescens NOT DETECTED NOT DETECTED Final   Haemophilus influenzae NOT DETECTED NOT DETECTED Final   Neisseria meningitidis NOT DETECTED NOT DETECTED Final   Pseudomonas aeruginosa NOT DETECTED NOT DETECTED Final   Candida albicans NOT DETECTED NOT DETECTED Final   Candida glabrata NOT DETECTED NOT DETECTED Final   Candida krusei NOT DETECTED NOT DETECTED Final   Candida parapsilosis NOT DETECTED NOT DETECTED Final   Candida tropicalis NOT DETECTED NOT DETECTED Final  Culture, blood (Routine X 2) w Reflex to ID Panel     Status: None   Collection Time: 06/28/17 11:09 AM  Result Value Ref Range Status   Specimen Description BLOOD LEFT HAND  Final   Special Requests IN PEDIATRIC BOTTLE  Blood Culture adequate volume  Final   Culture NO GROWTH 5 DAYS  Final   Report Status 07/03/2017 FINAL  Final  Body fluid culture     Status: None   Collection Time: 07/01/17  2:27 PM  Result Value Ref Range Status   Specimen Description FLUID PELVIS RIGHT  Final   Special Requests Normal  Final   Gram Stain   Final    FEW WBC PRESENT, PREDOMINANTLY MONONUCLEAR NO ORGANISMS SEEN    Culture NO GROWTH 3 DAYS  Final   Report Status 07/04/2017 FINAL  Final  MRSA PCR Screening     Status: None   Collection Time: 07/05/17  5:58 AM  Result Value Ref Range Status   MRSA by PCR NEGATIVE NEGATIVE Final    Comment:        The GeneXpert MRSA Assay (FDA approved for NASAL specimens only), is one component of a comprehensive MRSA colonization surveillance program. It is not intended to diagnose MRSA infection nor to guide or monitor treatment for MRSA infections.       Studies: No results found.  Scheduled Meds: . amLODipine  5 mg Oral Daily  . docusate sodium  100 mg Oral Daily  . feeding supplement (ENSURE ENLIVE)  237 mL Oral BID BM  . gabapentin  300 mg Oral BID  . lisinopril  10 mg Oral Daily  . metroNIDAZOLE  500 mg Oral Q8H  . multivitamin with minerals  1 tablet Oral Daily  . pantoprazole  40 mg Oral Daily  . simvastatin  20 mg Oral q1800    Continuous Infusions: . sodium chloride    . sodium chloride    . [START ON 07/06/2017] cefTRIAXone (ROCEPHIN)  IV    . fluconazole (DIFLUCAN) IV Stopped (07/05/17 0006)  . heparin Stopped (07/05/17 1005)  . lactated ringers 10 mL/hr at 07/05/17 1005     LOS: 8 days     Elmarie Shiley, MD [facility] Pager [phone]  If 7PM-7AM, please contact night-coverage [email or web address] Password [email or web address] 07/05/2017, 3:39 PM

## 2017-07-05 NOTE — Interval H&P Note (Signed)
   History and Physical Update  After re-evaluating the left leg, I doubt this patient's L leg will heal a L BKA as the skin is desquamating along the likely posterior flap.  I recommended to the patient L AKA.    Adele Barthel, MD, FACS [facility] [place] Office: [phone] Pager: [phone]  07/05/2017, 10:39 AM

## 2017-07-05 NOTE — Op Note (Addendum)
    OPERATIVE NOTE   PROCEDURE: 1.  left above-the-knee amputation 2.  Placement of negative pressure dressing on incision  PRE-OPERATIVE DIAGNOSIS: left foot gangrene  POST-OPERATIVE DIAGNOSIS: same as above  SURGEON: Adele Barthel, MD  ASSISTANT(S): Linus Orn, CSA  ANESTHESIA: general  ESTIMATED BLOOD LOSS: 50 cc  FINDING(S): 1.  Viable thigh muscle 2.  Extensive subcutaneous edema  SPECIMEN(S):  left above-the-knee amputation  INDICATIONS:   Anna Hayes is a 66 y.o. female who presents with left foot gangrene.  The patient is scheduled for a left above-the-knee amputation.  I discussed in depth with the patient the risks, benefits, and alternatives to this procedure.  The patient is aware that the risk of this operation included but are not limited to:  bleeding, infection, myocardial infarction, stroke, death, failure to heal amputation wound, and possible need for more proximal amputation.  The patient is aware of the risks and agrees proceed forward with the procedure.  DESCRIPTION: After full informed written consent was obtained from the patient, the patient was brought back to the operating room, and placed supine upon the operating table.  Prior to induction, the patient received IV antibiotics.  The patient was then prepped and draped in the standard fashion for an above-the-knee amputation.  I placed a non-sterile tourniquet on the thigh prior to the procedure.  After obtaining adequate anesthesia, the patient was prepped and draped in the standard fashion for a above-the-knee amputation.  I marked out the anterior and posterior flaps for a fish-mouth type of amputation.  I exsanguinated the leg with an Esmarch bandage and then inflated the tourniquet to 250 mm Hg.   I made the incisions for these flaps, and then dissected through the subcutaneous tissue, fascia, and muscles circumferentially.  There was extensive serous weeping from the subcutaneous tissue, so I  felt an incision VAC dressing was going to be necessary to facilitate the healing of this incision.  I elevated  the periosteal tissue 4 cm more proximal than the anterior skin flap.  I then transected the femur with a power saw at this level.  Then I smoothed out the rough edges of the bone with a rasp.  At this point, the specimen was passed off the field as the above-the-knee amputation.  At this point, I clamped all visibly bleeding arteries and veins using a combination of suture ligation with Vicryl suture and electrocautery.  The tourniquet was then deflated at this point.  Bleeding continued to be controlled with electrocautery and suture ligature.  The stump was washed off with sterile normal saline and no further active bleeding was noted.  I reapproximated the anterior and posterior fascia  with interrupted stitches of 2-0 Vicryl.  This was completed along the entire length of anterior and posterior fascia until there were no more loose space in the fascial line.  The skin was then  reapproximated with staples.  The stump was washed off and dried.  The incision was dressed with [employer] and  then fluffs were applied.  Kerlix was wrapped around the leg and then gently an ACE wrap was applied.     COMPLICATIONS: none  CONDITION: stable   Adele Barthel, MD, [facility] [place] Office: [phone] Pager: [phone]  07/05/2017, 12:23 PM

## 2017-07-05 NOTE — Interval H&P Note (Signed)
   History and Physical Update  The patient was interviewed and re-examined.  The patient's previous History and Physical has been reviewed and is unchanged from Dr. Stephens Shire consult except for: interval bilateral leg thrombembolectomy.  Despite successful revascularization of both legs, her left foot was dead, so she is scheduled at this point for L BKA vs AKA.   I discussed in depth the nature of left below-the-knee vs above-the-knee amputation with the patient, including risks, benefits, and alternatives.    The patient is aware that the risks of below-the-knee vs above-the-knee amputation include but are not limited to: bleeding, infection, myocardial infarction, stroke, death, failure to heal amputation wound, and possible need for more proximal amputation.    The patient is aware of the risks and agrees proceed forward with the procedure.  Adele Barthel, MD, FACS [facility] [place] Office: [phone] Pager: [phone]  07/05/2017, 9:59 AM

## 2017-07-05 NOTE — Anesthesia Procedure Notes (Signed)
Procedure Name: LMA Insertion Date/Time: 07/05/2017 11:01 AM Performed by: Neldon Newport Pre-anesthesia Checklist: Timeout performed, Patient being monitored, Suction available, Emergency Drugs available and Patient identified Patient Re-evaluated:Patient Re-evaluated prior to induction Oxygen Delivery Method: Circle system utilized Preoxygenation: Pre-oxygenation with 100% oxygen Induction Type: IV induction Ventilation: Mask ventilation without difficulty LMA: LMA inserted LMA Size: 4.0 Number of attempts: 1 Placement Confirmation: breath sounds checked- equal and bilateral,  positive ETCO2 and ETT inserted through vocal cords under direct vision Tube secured with: Tape Dental Injury: Teeth and Oropharynx as per pre-operative assessment

## 2017-07-05 NOTE — Progress Notes (Signed)
Subjective: No new complaints   Antibiotics:  Anti-infectives    Start     Dose/Rate Route Frequency Ordered Stop   06/29/17 2300  fluconazole (DIFLUCAN) IVPB 400 mg     400 mg 100 mL/hr over 120 Minutes Intravenous Every 24 hours 06/28/17 1413     06/29/17 2200  metroNIDAZOLE (FLAGYL) tablet 500 mg     500 mg Oral Every 8 hours 06/29/17 1918     06/29/17 0600  cefTRIAXone (ROCEPHIN) 2 g in dextrose 5 % 50 mL IVPB     2 g 100 mL/hr over 30 Minutes Intravenous Every 24 hours 06/28/17 1413     06/28/17 2300  fluconazole (DIFLUCAN) IVPB 800 mg     800 mg 200 mL/hr over 120 Minutes Intravenous  Once 06/28/17 1413 06/29/17 0148   06/28/17 2200  anidulafungin (ERAXIS) 100 mg in sodium chloride 0.9 % 100 mL IVPB  Status:  Discontinued     100 mg 78 mL/hr over 100 Minutes Intravenous Daily at bedtime 06/27/17 2324 06/28/17 1413   06/28/17 0600  levofloxacin (LEVAQUIN) IVPB 500 mg  Status:  Discontinued     500 mg 100 mL/hr over 60 Minutes Intravenous Every 24 hours 06/28/17 0006 06/28/17 1413   06/28/17 0100  anidulafungin (ERAXIS) 200 mg in sodium chloride 0.9 % 200 mL IVPB     200 mg 78 mL/hr over 200 Minutes Intravenous  Once 06/28/17 0015 06/28/17 0549   06/27/17 2359  metroNIDAZOLE (FLAGYL) IVPB 500 mg  Status:  Discontinued     500 mg 100 mL/hr over 60 Minutes Intravenous Every 8 hours 06/27/17 2324 06/29/17 1918   06/27/17 2359  levofloxacin (LEVAQUIN) IVPB 500 mg  Status:  Discontinued     500 mg 100 mL/hr over 60 Minutes Intravenous Daily at bedtime 06/27/17 2324 06/28/17 0006   06/27/17 2030  vancomycin (VANCOCIN) IVPB 1000 mg/200 mL premix     1,000 mg 200 mL/hr over 60 Minutes Intravenous Every 12 hours 06/27/17 1741 06/28/17 1110   06/27/17 1345  cefUROXime (ZINACEF) 1.5 g in dextrose 5 % 50 mL IVPB     1.5 g 100 mL/hr over 30 Minutes Intravenous To Surgery 06/27/17 1340 06/27/17 1341      Medications: Scheduled Meds: . amLODipine  5 mg Oral Daily  .  docusate sodium  100 mg Oral Daily  . feeding supplement (ENSURE ENLIVE)  237 mL Oral BID BM  . gabapentin  300 mg Oral BID  . lisinopril  10 mg Oral Daily  . metroNIDAZOLE  500 mg Oral Q8H  . multivitamin with minerals  1 tablet Oral Daily  . pantoprazole  40 mg Oral Daily  . simvastatin  20 mg Oral q1800   Continuous Infusions: . sodium chloride    . sodium chloride    . cefTRIAXone (ROCEPHIN)  IV 2 g (07/05/17 0547)  . fluconazole (DIFLUCAN) IV Stopped (07/05/17 0006)  . heparin Stopped (07/05/17 1005)  . lactated ringers 10 mL/hr at 07/05/17 1005   PRN Meds:.sodium chloride, acetaminophen **OR** acetaminophen, alum & mag hydroxide-simeth, guaiFENesin-dextromethorphan, morphine injection, ondansetron, oxyCODONE-acetaminophen, phenol    Objective: Weight change:   Intake/Output Summary (Last 24 hours) at 07/05/17 1424 Last data filed at 07/05/17 1210  Gross per 24 hour  Intake           767.63 ml  Output              140 ml  Net  627.63 ml   Blood pressure 121/71, pulse 99, temperature 98.1 F (36.7 C), resp. rate 18, height 5\' 2"  (1.575 m), weight 175 lb (79.4 kg), SpO2 98 %. Temp:  [98.1 F (36.7 C)-100.1 F (37.8 C)] 98.1 F (36.7 C) (10/10 1234) Pulse Rate:  [83-105] 99 (10/10 1405) Resp:  [14-19] 18 (10/10 1405) BP: (116-130)/(62-72) 121/71 (10/10 1405) SpO2:  [96 %-99 %] 98 % (10/10 1405)  Physical Exam: General: Alert and awake, oriented x3,  HEENT: anicteric sclera, pupils reactive to light and accommodation, EOMI CVS regular rate, normal r,   Chest:  no wheezing, resp distress Abdomen: soft midline bandage  Left foot exam deferred today  Neuro: nonfocal  CBC:    BMET  Recent Labs  07/04/17 0029 07/05/17 0226  NA 136 137  K 3.0* 4.1  CL 100* 103  CO2 29 26  GLUCOSE 103* 107*  BUN <5* 5*  CREATININE 0.88 0.75  CALCIUM 7.2* 7.4*     Liver Panel  No results for input(s): PROT, ALBUMIN, AST, ALT, ALKPHOS, BILITOT, BILIDIR,  IBILI in the last 72 hours.     Sedimentation Rate No results for input(s): ESRSEDRATE in the last 72 hours. C-Reactive Protein No results for input(s): CRP in the last 72 hours.  Micro Results: Recent Results (from the past 720 hour(s))  Culture, blood (Routine X 2) w Reflex to ID Panel     Status: Abnormal   Collection Time: 06/28/17 11:00 AM  Result Value Ref Range Status   Specimen Description BLOOD LEFT ARM  Final   Special Requests IN PEDIATRIC BOTTLE Blood Culture adequate volume  Final   Culture  Setup Time   Final    GRAM POSITIVE COCCI IN PEDIATRIC BOTTLE CRITICAL RESULT CALLED TO, READ BACK BY AND VERIFIED WITH: N. Batchelder Pharm.D. 9:55 06/29/17 ([email or web address])    Culture (A)  Final    STAPHYLOCOCCUS SPECIES (COAGULASE NEGATIVE) THE SIGNIFICANCE OF ISOLATING THIS ORGANISM FROM A SINGLE SET OF BLOOD CULTURES WHEN MULTIPLE SETS ARE DRAWN IS UNCERTAIN. PLEASE NOTIFY THE MICROBIOLOGY DEPARTMENT WITHIN ONE WEEK IF SPECIATION AND SENSITIVITIES ARE REQUIRED.    Report Status 07/01/2017 FINAL  Final  Blood Culture ID Panel (Reflexed)     Status: Abnormal   Collection Time: 06/28/17 11:00 AM  Result Value Ref Range Status   Enterococcus species NOT DETECTED NOT DETECTED Final   Listeria monocytogenes NOT DETECTED NOT DETECTED Final   Staphylococcus species DETECTED (A) NOT DETECTED Final    Comment: Methicillin (oxacillin) resistant coagulase negative staphylococcus. Possible blood culture contaminant (unless isolated from more than one blood culture draw or clinical case suggests pathogenicity). No antibiotic treatment is indicated for blood  culture contaminants. CRITICAL RESULT CALLED TO, READ BACK BY AND VERIFIED WITH: N. Batchelder Pharm.D. 9:55 06/29/17 ([email or web address])    Staphylococcus aureus NOT DETECTED NOT DETECTED Final   Methicillin resistance DETECTED (A) NOT DETECTED Final    Comment: CRITICAL RESULT CALLED TO, READ BACK BY AND VERIFIED WITH: N. Batchelder Pharm.D. 9:55  06/29/17 ([email or web address])    Streptococcus species NOT DETECTED NOT DETECTED Final   Streptococcus agalactiae NOT DETECTED NOT DETECTED Final   Streptococcus pneumoniae NOT DETECTED NOT DETECTED Final   Streptococcus pyogenes NOT DETECTED NOT DETECTED Final   Acinetobacter baumannii NOT DETECTED NOT DETECTED Final   Enterobacteriaceae species NOT DETECTED NOT DETECTED Final   Enterobacter cloacae complex NOT DETECTED NOT DETECTED Final   Escherichia coli NOT DETECTED NOT DETECTED Final   Klebsiella oxytoca NOT DETECTED NOT DETECTED Final  Klebsiella pneumoniae NOT DETECTED NOT DETECTED Final   Proteus species NOT DETECTED NOT DETECTED Final   Serratia marcescens NOT DETECTED NOT DETECTED Final   Haemophilus influenzae NOT DETECTED NOT DETECTED Final   Neisseria meningitidis NOT DETECTED NOT DETECTED Final   Pseudomonas aeruginosa NOT DETECTED NOT DETECTED Final   Candida albicans NOT DETECTED NOT DETECTED Final   Candida glabrata NOT DETECTED NOT DETECTED Final   Candida krusei NOT DETECTED NOT DETECTED Final   Candida parapsilosis NOT DETECTED NOT DETECTED Final   Candida tropicalis NOT DETECTED NOT DETECTED Final  Culture, blood (Routine X 2) w Reflex to ID Panel     Status: None   Collection Time: 06/28/17 11:09 AM  Result Value Ref Range Status   Specimen Description BLOOD LEFT HAND  Final   Special Requests IN PEDIATRIC BOTTLE Blood Culture adequate volume  Final   Culture NO GROWTH 5 DAYS  Final   Report Status 07/03/2017 FINAL  Final  Body fluid culture     Status: None   Collection Time: 07/01/17  2:27 PM  Result Value Ref Range Status   Specimen Description FLUID PELVIS RIGHT  Final   Special Requests Normal  Final   Gram Stain   Final    FEW WBC PRESENT, PREDOMINANTLY MONONUCLEAR NO ORGANISMS SEEN    Culture NO GROWTH 3 DAYS  Final   Report Status 07/04/2017 FINAL  Final  MRSA PCR Screening     Status: None   Collection Time: 07/05/17  5:58 AM  Result Value Ref Range  Status   MRSA by PCR NEGATIVE NEGATIVE Final    Comment:        The GeneXpert MRSA Assay (FDA approved for NASAL specimens only), is one component of a comprehensive MRSA colonization surveillance program. It is not intended to diagnose MRSA infection nor to guide or monitor treatment for MRSA infections.     Studies/Results: No results found.    Assessment/Plan:  INTERVAL HISTORY: pt sp amputation at time of this note being written   Principal Problem:   Critical lower limb ischemia Active Problems:   PAD (peripheral artery disease) (HCC)   Stroke (HCC)   SBO (small bowel obstruction) (HCC)   Intraabdominal fluid collection absess   Fungemia   Abdominal pain   History of CVA with residual deficit   Tobacco abuse   Acute blood loss anemia   Post-operative pain   Leukocytosis   Ischemic neuropathy of left foot   DNR (do not resuscitate) discussion   Palliative care by specialist    Anna Hayes is a 66 y.o. female with  Hx of SBO, perforation sp ex lap complicated by abscess, fungemia with C tropicalis, limb ischemia sp embolectomy and angioplasty  #1 Nonviable foot on the left:  Thankfully sp curative AKA  #2 Abdominal abscess: continue antibacterial abx and antifungal abx until this is resolved I will simplify to unasyn and fluconazole  #3 Fungemia with C tropicalis: pt needs MINIMUM of 14 days of fluconazole  SHE DOES STILL ALSO NEED OPTHO exam to exclude endopthalmitis      LOS: 8 days   Alcide Evener 07/05/2017, 2:24 PM

## 2017-07-05 NOTE — Progress Notes (Signed)
[facility] Progress Note  8 Days Post-Op  Subjective: CC: abdominal wound Patient still sleepy this AM, trying to wake up. Tolerating diet.   Objective: Vital signs in last 24 hours: Temp:  [98.2 F (36.8 C)-100.1 F (37.8 C)] 98.2 F (36.8 C) (10/10 0717) Pulse Rate:  [83-102] 89 (10/10 0717) Resp:  [14-16] 16 (10/10 0717) BP: (116-130)/(69-72) 130/72 (10/10 0717) SpO2:  [96 %-100 %] 98 % (10/10 0717) Last BM Date: 07/02/17  Intake/Output from previous day: 10/09 0701 - 10/10 0700 In: 245 [P.O.:240] Out: 40 [Drains:40] Intake/Output this shift: No intake/output data recorded.  PE: Gen:  Alert, NAD, pleasant Card:  Regular rate and rhythm Pulm:  Normal effort Abd: Soft, non-tender, non-distended, bowel sounds present; midline wound as below   Skin: warm and dry, no rashes  Psych: A&Ox3   Lab Results:   Recent Labs  07/04/17 0029 07/05/17 0226  WBC 15.3* 14.5*  HGB 7.9* 7.8*  HCT 24.3* 24.7*  PLT 301 317   BMET  Recent Labs  07/04/17 0029 07/05/17 0226  NA 136 137  K 3.0* 4.1  CL 100* 103  CO2 29 26  GLUCOSE 103* 107*  BUN <5* 5*  CREATININE 0.88 0.75  CALCIUM 7.2* 7.4*   PT/INR  Recent Labs  07/05/17 0226  LABPROT 16.2*  INR 1.31   CMP     Component Value Date/Time   NA 137 07/05/2017 0226   K 4.1 07/05/2017 0226   CL 103 07/05/2017 0226   CO2 26 07/05/2017 0226   GLUCOSE 107 (H) 07/05/2017 0226   BUN 5 (L) 07/05/2017 0226   CREATININE 0.75 07/05/2017 0226   CALCIUM 7.4 (L) 07/05/2017 0226   PROT 5.2 (L) 07/01/2017 0621   ALBUMIN 1.6 (L) 07/01/2017 0621   AST 32 07/01/2017 0621   ALT 17 07/01/2017 0621   ALKPHOS 47 07/01/2017 0621   BILITOT 0.6 07/01/2017 0621   GFRNONAA >60 07/05/2017 0226   GFRAA >60 07/05/2017 0226   Lipase  No results found for: LIPASE     Studies/Results: Ct Abdomen Pelvis W Contrast  Addendum Date: 07/03/2017   ADDENDUM REPORT: 07/03/2017 14:20 ADDENDUM: Small amount of fluid  around the right common femoral vessels compatible with recent surgery. No significant lymph node enlargement in the abdomen or pelvis. Electronically Signed   By: Markus Daft M.D.   On: 07/03/2017 14:20   Result Date: 07/03/2017 CLINICAL DATA:  Abdominal pain and fever. History of pelvic fluid collection and status post percutaneous drain. EXAM: CT ABDOMEN AND PELVIS WITH CONTRAST TECHNIQUE: Multidetector CT imaging of the abdomen and pelvis was performed using the standard protocol following bolus administration of intravenous contrast. CONTRAST:  125mL ISOVUE-300 IOPAMIDOL (ISOVUE-300) INJECTION 61% COMPARISON:  06/24/2017 and 06/26/2017 FINDINGS: Lower chest: Mild atelectasis at the lung bases. No pleural effusions. Coronary artery calcifications. Hepatobiliary: Normal appearance of the liver, gallbladder and portal venous system. No biliary dilatation. Pancreas: Normal appearance of the pancreas without inflammation or duct dilatation. Spleen: Normal appearance of spleen without enlargement. Adrenals/Urinary Tract: Stable nodularity in the left adrenal gland. Right adrenal tissue is unremarkable. Low-density structure in the right kidney upper pole likely represents a cyst. This cyst is slightly more dense than would be expected but no significantly change in Hounsfield units on the delayed imaging. No hydronephrosis. Small focus of gas within the urinary bladder. Stomach/Bowel: Normal appearance of stomach. Decreased distension of small bowel loops containing fluid in left abdomen. No evidence for a high-grade bowel obstruction. Evidence  for an appendectomy. Vascular/Lymphatic: Atherosclerotic calcifications in the aorta and iliac arteries without aneurysm. There appears to be blood flow in the main visceral arteries. Portal venous system and main mesenteric veins are patent. IVC and renal veins are patent. Iliac veins appear to be patent. Reproductive: Uterus has been removed. Again noted is a small cyst or  follicle in the right ovary. Pelvic fluid collection is adjacent to left adnexa. Other: Again noted is a right transgluteal pigtail drain within a small pelvic fluid collection. The pelvic fluid collection has decreased in size measuring 2.6 x 5.2 cm and previously measured 3.3 x 8.0 cm. This collection is adjacent to the left adnexa. Again noted is a fluid collection in the anterior lower abdomen which measures 7.4 x 2.9 cm on sequence 3, image 60, previously measured 10.0 x 3.4 cm. The anterior abdominal incision has been opened and the patient now has a wound VAC in this area. There is extensive edematous tissue or fat between the wound VAC and this anterior fluid collection. Again noted is a small amount of fluid just anterior to the left psoas muscle on sequence 3, image 58. Again noted is a small amount of soft tissue gas along the anterior abdominal incision on sequence 3, image 44. There is a fluid collection along the right side of lower abdomen on sequence 3, image 69 that could be associated with bowel. Musculoskeletal: No acute bone abnormality. There is fluid collection just anterior to the left common femoral vessels on sequence 3, image 84. This collection measures 3.4 x 2.7 cm and compatible with recent surgery. There is a small air-fluid collection just below the skin surface in the left inguinal region which is likely postoperative as well. Subcutaneous edema in the abdomen and pelvis. IMPRESSION: Pelvic abscess collection containing a percutaneous drain has decreased in size but there is residual fluid in this collection. There is a residual fluid collection or abscess in the anterior lower abdomen that has decreased in size. Postsurgical changes with a wound VAC in the anterior lower abdomen. Probable right renal cyst. Small amount of gas in the urinary bladder that could be iatrogenic. Electronically Signed: By: Markus Daft M.D. On: 07/03/2017 13:46    Anti-infectives: Anti-infectives     Start     Dose/Rate Route Frequency Ordered Stop   06/29/17 2300  fluconazole (DIFLUCAN) IVPB 400 mg     400 mg 100 mL/hr over 120 Minutes Intravenous Every 24 hours 06/28/17 1413     06/29/17 2200  metroNIDAZOLE (FLAGYL) tablet 500 mg     500 mg Oral Every 8 hours 06/29/17 1918     06/29/17 0600  cefTRIAXone (ROCEPHIN) 2 g in dextrose 5 % 50 mL IVPB     2 g 100 mL/hr over 30 Minutes Intravenous Every 24 hours 06/28/17 1413     06/28/17 2300  fluconazole (DIFLUCAN) IVPB 800 mg     800 mg 200 mL/hr over 120 Minutes Intravenous  Once 06/28/17 1413 06/29/17 0148   06/28/17 2200  anidulafungin (ERAXIS) 100 mg in sodium chloride 0.9 % 100 mL IVPB  Status:  Discontinued     100 mg 78 mL/hr over 100 Minutes Intravenous Daily at bedtime 06/27/17 2324 06/28/17 1413   06/28/17 0600  levofloxacin (LEVAQUIN) IVPB 500 mg  Status:  Discontinued     500 mg 100 mL/hr over 60 Minutes Intravenous Every 24 hours 06/28/17 0006 06/28/17 1413   06/28/17 0100  anidulafungin (ERAXIS) 200 mg in sodium chloride 0.9 %  200 mL IVPB     200 mg 78 mL/hr over 200 Minutes Intravenous  Once 06/28/17 0015 06/28/17 0549   06/27/17 2359  metroNIDAZOLE (FLAGYL) IVPB 500 mg  Status:  Discontinued     500 mg 100 mL/hr over 60 Minutes Intravenous Every 8 hours 06/27/17 2324 06/29/17 1918   06/27/17 2359  levofloxacin (LEVAQUIN) IVPB 500 mg  Status:  Discontinued     500 mg 100 mL/hr over 60 Minutes Intravenous Daily at bedtime 06/27/17 2324 06/28/17 0006   06/27/17 2030  vancomycin (VANCOCIN) IVPB 1000 mg/200 mL premix     1,000 mg 200 mL/hr over 60 Minutes Intravenous Every 12 hours 06/27/17 1741 06/28/17 1110   06/27/17 1345  cefUROXime (ZINACEF) 1.5 g in dextrose 5 % 50 mL IVPB     1.5 g 100 mL/hr over 30 Minutes Intravenous To Surgery 06/27/17 1340 06/27/17 1341       Assessment/Plan Lower limb ischemia - s/p thrombectomy of multiplebilateral vessels and left leg fasciotomy 06/27/17 Dr. Trula Slade. Vascular now  discussing amputation PAD HTN H/o stroke with residual left sided weakness Anemia - S/p 2 units PRBC 10/2. Hg 7.8, stable AKI - Cr 0.75, trending down Fungemia candida tropicalis - fluconazole x2 weeks per ID  SBO - s/p exploratory laparotomy with lysis of adhesions and small bowel perforation repair for [facility] by Dr. Noberto Retort on 06/19/17 - s/p IR placement of transgluteal drain 10/1 for pelvic fluid collection seen on CT scan with only "thin fluid" returned. IR following here. - drain with 10cc/24hr serosanguinousfluid. Repeat CT showed fluid around drain  - tolerating diet and having BMs  ID - flagyl 10/2>>day#9, rocephin 10/4>>day#7, diflucan 10/4>>day#7 VTE - heparin FEN - regular diet; Boost Foley - none Follow up - Dr. Noberto Retort  Plan - Continue VAC dressing changes MWF. She may follow up with Dr. Noberto Retort in [place] when she leaves the hospital. Drain care per IR.   LOS: 8 days    Brigid Re , [facility] Surgery 07/05/2017, 9:13 AM Pager: [phone] Consults: [phone] Mon-Fri 7:00 am-4:30 pm Sat-Sun 7:00 am-11:30 am

## 2017-07-05 NOTE — H&P (View-Only) (Signed)
Consult Note  Patient name: Anna Hayes MRN: [id number] DOB: Sep 03, 1951 Sex: female  Consulting Physician:  Hospital service  Reason for Consult: Ischemic leg   HISTORY OF PRESENT ILLNESS: I received a phone call from the hospitalist at [facility] this morning.  The patient has recently undergone abdominal exploration and bowel resection.  She has an open abdomen.  She had a percutaneous drain placed yesterday.  A CT scan was performed last night which showed popliteal occlusion as well as thrombus within the left femoral artery which was occlusive and thrombus in the right femoral artery.  The patient was started on heparin.  When the patient was evaluated this morning, there was concern for limb ischemia.  After speaking with the hospitalist, I recommended emergent transfer.  The patient has just arrived.  She does not complain of significant leg pain although she cannot move it or feel it.  She does have a history of stroke that affected the left side.   Past Medical History:  Diagnosis Date  . Frequent headaches   . Muscle pain   . Palpitations   . Sinus problem   . Stroke [facility])     No past surgical history on file.  Social History   Social History  . Marital status: Widowed    Spouse name: N/A  . Number of children: N/A  . Years of education: N/A   Occupational History  . Not on file.   Social History Main Topics  . Smoking status: Current Every Day Smoker    Packs/day: 0.50    Types: Cigarettes  . Smokeless tobacco: Never Used  . Alcohol use No  . Drug use: No  . Sexual activity: Not on file   Other Topics Concern  . Not on file   Social History Narrative  . No narrative on file    No family history on file.  Allergies as of 06/27/2017 - Review Complete 06/27/2017  Allergen Reaction Noted  . Codeine  10/08/2013    No current facility-administered medications on file prior to encounter.    Current Outpatient Prescriptions on  File Prior to Encounter  Medication Sig Dispense Refill  . amLODipine (NORVASC) 5 MG tablet     . atenolol (TENORMIN) 50 MG tablet     . betamethasone dipropionate (DIPROLENE) 0.05 % cream     . butalbital-acetaminophen-caffeine (FIORICET, ESGIC) 50-325-40 MG per tablet     . dicyclomine (BENTYL) 10 MG capsule     . furosemide (LASIX) 20 MG tablet     . gabapentin (NEURONTIN) 300 MG capsule     . HYDROcodone-acetaminophen (NORCO/VICODIN) 5-325 MG per tablet Take 1 tablet by mouth every 4 (four) hours as needed. 40 tablet 0  . levothyroxine (SYNTHROID, LEVOTHROID) 25 MCG tablet     . lisinopril (PRINIVIL,ZESTRIL) 10 MG tablet     . metoCLOPramide (REGLAN) 5 MG tablet     . omeprazole (PRILOSEC) 40 MG capsule     . promethazine (PHENERGAN) 25 MG tablet     . simvastatin (ZOCOR) 40 MG tablet        REVIEW OF SYSTEMS: Cardiovascular: No chest pain, chest pressure, palpitations, orthopnea, or dyspnea on exertion. No claudication or rest pain,  No history of DVT or phlebitis. Pulmonary: No productive cough, asthma or wheezing. Neurologic: left side weakness Hematologic: No bleeding problems or clotting disorders. Musculoskeletal: No joint pain or joint swelling. Gastrointestinal: open abdomen Genitourinary: No dysuria or hematuria. Psychiatric:: No  history of major depression. Integumentary: No rashes or ulcers. Constitutional: No fever or chills.  PHYSICAL EXAMINATION: General: The patient appears their stated age.  Vital signs are BP (!) 145/67 (BP Location: Right Arm)   Pulse 85   Temp 98.8 F (37.1 C) (Oral)   Resp 18   Ht 5\' 2"  (1.575 m)   Wt 175 lb 8 oz (79.6 kg)   SpO2 98%   BMI 32.10 kg/m  Pulmonary: Respirations are non-labored HEENT:  No gross abnormalities Abdomen: Soft and non-tender  Musculoskeletal: There are no major deformities.   Neurologic: No motor or sensory function present of the left foot. Skin: The left foot is mottled. Marland Kitchen Psychiatric: The patient has  normal affect. Cardiovascular:  Doppler right DP PT.  No doppler signals in left leg.  Palpable femoral pulses  Diagnostic Studies: I have reviewed the CT scan with radiology.  The patient has bilateral popliteal occlusions.  There is no reconstitution on the left.  There are tibial reconstitution on the right.  There also appears to be thrombus within the femoral arteries bilaterally.  Nearly occlusive on the left.   Assessment:  Ischemic left leg Acute thrombus right leg Plan: The patient has a mottled ischemic appearing left leg with decreased motor function and sensation, almost absent.  I discussed with the patient and her daughter via telephone that I think she is at high risk for limb loss.  Her only hope for limb salvage is to go the operating room and proceed with thrombectomy as well as fasciotomy on the left leg.  She does have Doppler signals on the right, however there is popliteal occlusion which is likely acute, and therefore I will proceed with embolectomy of the right leg concomitantly.  They understand this is a true emergency and we need to go the operating room immediately.     Eldridge Abrahams, M.D. [facility] Office: [phone] Pager:  [phone]

## 2017-07-06 ENCOUNTER — Encounter (HOSPITAL_COMMUNITY): Payer: Self-pay | Admitting: Vascular Surgery

## 2017-07-06 DIAGNOSIS — K651 Peritoneal abscess: Secondary | ICD-10-CM

## 2017-07-06 DIAGNOSIS — N739 Female pelvic inflammatory disease, unspecified: Secondary | ICD-10-CM

## 2017-07-06 DIAGNOSIS — L8990 Pressure ulcer of unspecified site, unspecified stage: Secondary | ICD-10-CM | POA: Insufficient documentation

## 2017-07-06 LAB — CBC
HEMATOCRIT: 24.1 % — AB (ref 36.0–46.0)
HEMOGLOBIN: 7.6 g/dL — AB (ref 12.0–15.0)
MCH: 24.5 pg — ABNORMAL LOW (ref 26.0–34.0)
MCHC: 31.5 g/dL (ref 30.0–36.0)
MCV: 77.7 fL — ABNORMAL LOW (ref 78.0–100.0)
Platelets: 345 10*3/uL (ref 150–400)
RBC: 3.1 MIL/uL — AB (ref 3.87–5.11)
RDW: 20.5 % — ABNORMAL HIGH (ref 11.5–15.5)
WBC: 18.8 10*3/uL — AB (ref 4.0–10.5)

## 2017-07-06 LAB — BASIC METABOLIC PANEL
ANION GAP: 8 (ref 5–15)
BUN: 7 mg/dL (ref 6–20)
CALCIUM: 7.6 mg/dL — AB (ref 8.9–10.3)
CHLORIDE: 101 mmol/L (ref 101–111)
CO2: 27 mmol/L (ref 22–32)
Creatinine, Ser: 0.68 mg/dL (ref 0.44–1.00)
GFR calc non Af Amer: >60 mL/min (ref >60)
Glucose, Bld: 111 mg/dL — ABNORMAL HIGH (ref 65–99)
Potassium: 4.1 mmol/L (ref 3.5–5.1)
SODIUM: 136 mmol/L (ref 135–145)

## 2017-07-06 LAB — HEPARIN LEVEL (UNFRACTIONATED)
HEPARIN UNFRACTIONATED: 0.11 [IU]/mL — AB (ref 0.30–0.70)
HEPARIN UNFRACTIONATED: 0.28 [IU]/mL — AB (ref 0.30–0.70)

## 2017-07-06 MED ORDER — CEFPODOXIME PROXETIL 200 MG PO TABS
200.0000 mg | ORAL_TABLET | Freq: Two times a day (BID) | ORAL | Status: DC
  Administered 2017-07-07 – 2017-07-12 (×10): 200 mg via ORAL
  Filled 2017-07-06 (×12): qty 1

## 2017-07-06 MED ORDER — FLUCONAZOLE 100 MG PO TABS
400.0000 mg | ORAL_TABLET | Freq: Every day | ORAL | Status: DC
  Administered 2017-07-06 – 2017-07-07 (×2): 400 mg via ORAL
  Filled 2017-07-06 (×2): qty 4

## 2017-07-06 MED ORDER — CYCLOPENTOLATE HCL 1 % OP SOLN
2.0000 [drp] | OPHTHALMIC | Status: AC
  Administered 2017-07-06: 2 [drp] via OPHTHALMIC
  Filled 2017-07-06 (×2): qty 2

## 2017-07-06 MED ORDER — POLYSACCHARIDE IRON COMPLEX 150 MG PO CAPS
150.0000 mg | ORAL_CAPSULE | Freq: Every day | ORAL | Status: DC
  Administered 2017-07-06 – 2017-07-12 (×6): 150 mg via ORAL
  Filled 2017-07-06 (×7): qty 1

## 2017-07-06 MED ORDER — HEPARIN (PORCINE) IN NACL 100-0.45 UNIT/ML-% IJ SOLN
900.0000 [IU]/h | INTRAMUSCULAR | Status: DC
  Administered 2017-07-07: 1000 [IU]/h via INTRAVENOUS
  Administered 2017-07-08: 750 [IU]/h via INTRAVENOUS
  Administered 2017-07-09: 900 [IU]/h via INTRAVENOUS
  Filled 2017-07-06 (×4): qty 250

## 2017-07-06 NOTE — Progress Notes (Signed)
Nutrition Follow-up  DOCUMENTATION CODES:   Obesity unspecified  INTERVENTION:    Continue Ensure Enlive po BID, each supplement provides 350 kcal and 20 grams of protein  Continue MVI daily  NUTRITION DIAGNOSIS:   Increased nutrient needs related to wound healing as evidenced by estimated needs.  Ongoing  GOAL:   Patient will meet greater than or equal to 90% of their needs  Progressing  MONITOR:   PO intake, Diet advancement, Skin  ASSESSMENT:   66 yo female with PMH of stroke, frequent headaches who was admitted to [facility] on 9/22, treated for SBO (exploratory laparotomy), pelvic abscess (percutaneous drain placed), and fungemia. She was emergently transferred to [facility] on 10/2 due to arterial blockage of bilateral LE; S/P thrombectomy and fasciotomy 10/2.  S/P L BKA 10/10. Patient is consuming 25-75% of meals since surgery yesterday. She is also being offered Ensure Enlive supplements BID between meals. Labs and medications reviewed.  Diet Order:  Diet Heart Room service appropriate? Yes; Fluid consistency: Thin  Skin:   (stage II sacrum, multiple surgical incisions; L leg wound; VAC to abd wound)  Last BM:  10/5  Height:   Ht Readings from Last 1 Encounters:  06/27/17 5\' 2"  (1.575 m)    Weight:   Wt Readings from Last 1 Encounters:  07/06/17 153 lb (69.4 kg)    Ideal Body Weight:  50 kg  BMI:  Body mass index is 27.98 kg/m.  Estimated Nutritional Needs:   Kcal:  1600-1800  Protein:  100-120 gm  Fluid:  1.8 L  EDUCATION NEEDS:   No education needs identified at this time  Molli Barrows, [place], [place], [place] Pager [phone] After Hours Pager [phone]

## 2017-07-06 NOTE — Progress Notes (Signed)
PROGRESS NOTE  Anna Hayes [id number]:[id number] DOB: 08-Jul-1951 DOA: 06/27/2017 PCP: Rochel Brome, MD  HPI/Recap of past 69 hours: 66 year old woman presented with abdominal pain and was admitted to [facility] 9/22. Found to have small bowel obstruction. Underwent expiratory laparotomy with lysis of adhesions and release of small bowel obstruction, repair of small bowel perforation. Subsequently had fevers, leukocytosis and CT abdomen and pelvis 9/29 revealed pelvic abscess and underwent successful placement of percutaneous drain October 1. Also noted to have fungemia on 1/2 blood culture 9/27 and started on antifungal therapy. She subsequently developed left lower stomach pain and discomfort October 1 and underwent CT angiogram which revealed bilateral arterial blockage both lower extremities, was started on heparin and emergently transferred to [facility] where she underwent thrombectomy and fasciotomy October 2.  Since then, vascular surgery has felt that her left foot is nonviable and left BKA has been recommended, but patient is having difficulty making that decision. Palliative medicine was consulted and patient met with them and family today for goals of care discussion.  After this meeting, patient has opted for surgery.  Subjective;  She denies worsening abdominal pain. Left BKA pain controlled.   Assessment/Plan: Critical left leg ischemia, status post bilateral femoral embolectomy, angioplasty left posterior tibial artery, fasciotomy left leg 10/2. -VVS managing. Left foot nonviable. Patient now amenable to left BKA. Underwent surgery on 10/10 -repeat cbc tonight./  -Heparin per vascular.   Status post laparotomy, lysis of adhesions and repair of perforated bowel 9/24 at [facility]. -per general surgery. Wound VAC.   -now on ceftriaxone and flagyl.  -Follow Sx recommendation regarding care for wound vac  Abdominal abscess, status post percutaneous drain placement October  1. On treatment with Levaquin and Flagyl since 9/27. -WBC slowly trending down. Continue abx as per ID  CT done 10/8 notes persistent fluid collections.  -Follow Sx recommendation regarding care for wound vac.  -antibiotics change to cefpodoxime and fluconazole and flagyl.   Fungemia Candida tropicalis  -continue fluconazole per ID total 2 weeks.  ?longer -Appreciate Dr Tommy Medal, he has consulted opthalmology, to exclude fungal endolpthalmitis repeated blood culture 10-03 negative.   Hypoalbuminemia.  -diet per surgery .  -supplements per dietician   anemia, likely multifactorial including perioperative blood loss. S/p 2 units PRBC 10/2. -Hgb stable.   Start nu iron   Obesity unspecified: meets criteria BMI greater than 30  Status post AKI: Now resolved  PMH stroke with left upper and left lower extremity weakness, left facial droop  Code Status: Full code   Family Communication: daughter at the bedside  Disposition Plan: based on recovery following amputation   Consultants: Vascular surgery General surgery Infectious disease Interventional radiology Physical medicine and rehabilitation   Procedures:  9/24: Exploratory laparoscopy with lysis of adhesions and release of small bowel obstruction, repair of small bowel perforation  10/2: Transfused 2 units packed red blood cells  10/1: Percutaneous drain placement and right buttock for pelvic abscess  10/2: Bilateral femoral popliteal embolectomy, left posterior tibial artery patch angioplasty and left lower leg fasciotomy   10/10: Planned amputation  Antimicrobials: Anidulafungin 10/2 >>10/3 Fluconazole 10/3 >> 10/17 Levaquin 10/2 >>ceftriaxone   Metronidazole 10/2 >>   DVT prophylaxis:  Heparin   Objective: Vitals:   07/06/17 1000 07/06/17 1100 07/06/17 1200 07/06/17 1300  BP: (!) 114/58 107/63 108/62 120/61  Pulse: 91 93 96 (!) 101  Resp: _0 Temp:    98.6 F (37 C)  TempSrc:  Oral    SpO2: 96% 95% 97% 97%  Weight:      Height:        Intake/Output Summary (Last 24 hours) at 07/06/17 1411 Last data filed at 07/06/17 1300  Gross per 24 hour  Intake             1250 ml  Output             1500 ml  Net             -250 ml   Filed Weights   06/27/17 1142 07/01/17 0526 07/06/17 0300  Weight: 79.6 kg (175 lb 8 oz) 79.4 kg (175 lb) 69.4 kg (153 lb)    Exam:   General: Alert in no acute distress.   HEENT: MMM  Neck: Supple, No JVD  Cardiovascular: S 1, S 2 RRR  Respiratory: CTA  Abdomen: soft, nt, wound vac in place.   Musculoskeletal; no cyanosis.   Skin: left ANK. With dressing.      Data Reviewed: CBC:  Recent Labs Lab 07/02/17 0333 07/03/17 0306 07/04/17 0029 07/05/17 0226 07/05/17 1903 07/06/17 0808  WBC 16.3* 15.4* 15.3* 14.5*  --  18.8*  HGB 7.7* 7.8* 7.9* 7.8* 8.2* 7.6*  HCT 24.0* 24.3* 24.3* 24.7* 26.0* 24.1*  MCV 76.9* 76.7* 77.1* 77.7*  --  77.7*  PLT 298 281 301 317  --  147   Basic Metabolic Panel:  Recent Labs Lab 07/01/17 0621 07/03/17 1152 07/04/17 0029 07/04/17 1102 07/05/17 0226 07/06/17 0808  NA 136 135 136  --  137 136  K 4.1 3.6 3.0*  --  4.1 4.1  CL 106 100* 100*  --  103 101  CO2 21* 25 29  --  26 27  GLUCOSE 91 87 103*  --  107* 111*  BUN <5* <5* <5*  --  5* 7  CREATININE 0.81 0.76 0.88  --  0.75 0.68  CALCIUM 7.0* 7.3* 7.2*  --  7.4* 7.6*  MG 1.7  --   --  1.7  --   --    GFR: Estimated Creatinine Clearance: 61.4 mL/min (by C-G formula based on SCr of 0.68 mg/dL). Liver Function Tests:  Recent Labs Lab 07/01/17 0621  AST 32  ALT 17  ALKPHOS 47  BILITOT 0.6  PROT 5.2*  ALBUMIN 1.6*   No results for input(s): LIPASE, AMYLASE in the last 168 hours. No results for input(s): AMMONIA in the last 168 hours. Coagulation Profile:  Recent Labs Lab 07/05/17 0226  INR 1.31   Cardiac Enzymes:  Recent Labs Lab 07/03/17 1152  CKTOTAL 3,206*   BNP (last 3 results) No results for input(s):  PROBNP in the last 8760 hours. HbA1C: No results for input(s): HGBA1C in the last 72 hours. CBG: No results for input(s): GLUCAP in the last 168 hours. Lipid Profile: No results for input(s): CHOL, HDL, LDLCALC, TRIG, CHOLHDL, LDLDIRECT in the last 72 hours. Thyroid Function Tests: No results for input(s): TSH, T4TOTAL, FREET4, T3FREE, THYROIDAB in the last 72 hours. Anemia Panel: No results for input(s): VITAMINB12, FOLATE, FERRITIN, TIBC, IRON, RETICCTPCT in the last 72 hours. Urine analysis: No results found for: COLORURINE, APPEARANCEUR, [place], [place], GLUCOSEU, HGBUR, BILIRUBINUR, KETONESUR, PROTEINUR, UROBILINOGEN, NITRITE, LEUKOCYTESUR Sepsis Labs: _0 (procalcitonin:4,lacticidven:4)  ) Recent Results (from the past 240 hour(s))  Culture, blood (Routine X 2) w Reflex to ID Panel     Status: Abnormal   Collection Time: 06/28/17 11:00 AM  Result Value Ref Range Status  Specimen Description BLOOD LEFT ARM  Final   Special Requests IN PEDIATRIC BOTTLE Blood Culture adequate volume  Final   Culture  Setup Time   Final    GRAM POSITIVE COCCI IN PEDIATRIC BOTTLE CRITICAL RESULT CALLED TO, READ BACK BY AND VERIFIED WITH: N. Batchelder Pharm.D. 9:55 06/29/17 ([email or web address])    Culture (A)  Final    STAPHYLOCOCCUS SPECIES (COAGULASE NEGATIVE) THE SIGNIFICANCE OF ISOLATING THIS ORGANISM FROM A SINGLE SET OF BLOOD CULTURES WHEN MULTIPLE SETS ARE DRAWN IS UNCERTAIN. PLEASE NOTIFY THE MICROBIOLOGY DEPARTMENT WITHIN ONE WEEK IF SPECIATION AND SENSITIVITIES ARE REQUIRED.    Report Status 07/01/2017 FINAL  Final  Blood Culture ID Panel (Reflexed)     Status: Abnormal   Collection Time: 06/28/17 11:00 AM  Result Value Ref Range Status   Enterococcus species NOT DETECTED NOT DETECTED Final   Listeria monocytogenes NOT DETECTED NOT DETECTED Final   Staphylococcus species DETECTED (A) NOT DETECTED Final    Comment: Methicillin (oxacillin) resistant coagulase negative staphylococcus.  Possible blood culture contaminant (unless isolated from more than one blood culture draw or clinical case suggests pathogenicity). No antibiotic treatment is indicated for blood  culture contaminants. CRITICAL RESULT CALLED TO, READ BACK BY AND VERIFIED WITH: N. Batchelder Pharm.D. 9:55 06/29/17 ([email or web address])    Staphylococcus aureus NOT DETECTED NOT DETECTED Final   Methicillin resistance DETECTED (A) NOT DETECTED Final    Comment: CRITICAL RESULT CALLED TO, READ BACK BY AND VERIFIED WITH: N. Batchelder Pharm.D. 9:55 06/29/17 ([email or web address])    Streptococcus species NOT DETECTED NOT DETECTED Final   Streptococcus agalactiae NOT DETECTED NOT DETECTED Final   Streptococcus pneumoniae NOT DETECTED NOT DETECTED Final   Streptococcus pyogenes NOT DETECTED NOT DETECTED Final   Acinetobacter baumannii NOT DETECTED NOT DETECTED Final   Enterobacteriaceae species NOT DETECTED NOT DETECTED Final   Enterobacter cloacae complex NOT DETECTED NOT DETECTED Final   Escherichia coli NOT DETECTED NOT DETECTED Final   Klebsiella oxytoca NOT DETECTED NOT DETECTED Final   Klebsiella pneumoniae NOT DETECTED NOT DETECTED Final   Proteus species NOT DETECTED NOT DETECTED Final   Serratia marcescens NOT DETECTED NOT DETECTED Final   Haemophilus influenzae NOT DETECTED NOT DETECTED Final   Neisseria meningitidis NOT DETECTED NOT DETECTED Final   Pseudomonas aeruginosa NOT DETECTED NOT DETECTED Final   Candida albicans NOT DETECTED NOT DETECTED Final   Candida glabrata NOT DETECTED NOT DETECTED Final   Candida krusei NOT DETECTED NOT DETECTED Final   Candida parapsilosis NOT DETECTED NOT DETECTED Final   Candida tropicalis NOT DETECTED NOT DETECTED Final  Culture, blood (Routine X 2) w Reflex to ID Panel     Status: None   Collection Time: 06/28/17 11:09 AM  Result Value Ref Range Status   Specimen Description BLOOD LEFT HAND  Final   Special Requests IN PEDIATRIC BOTTLE Blood Culture adequate volume  Final   Culture  NO GROWTH 5 DAYS  Final   Report Status 07/03/2017 FINAL  Final  Body fluid culture     Status: None   Collection Time: 07/01/17  2:27 PM  Result Value Ref Range Status   Specimen Description FLUID PELVIS RIGHT  Final   Special Requests Normal  Final   Gram Stain   Final    FEW WBC PRESENT, PREDOMINANTLY MONONUCLEAR NO ORGANISMS SEEN    Culture NO GROWTH 3 DAYS  Final   Report Status 07/04/2017 FINAL  Final  MRSA PCR Screening     Status: None   Collection Time: 07/05/17  5:58 AM  Result Value Ref Range Status   MRSA by PCR NEGATIVE NEGATIVE Final    Comment:        The GeneXpert MRSA Assay (FDA approved for NASAL specimens only), is one component of a comprehensive MRSA colonization surveillance program. It is not intended to diagnose MRSA infection nor to guide or monitor treatment for MRSA infections.       Studies: No results found.  Scheduled Meds: . amLODipine  5 mg Oral Daily  . cefpodoxime  200 mg Oral Q12H  . docusate sodium  100 mg Oral Daily  . feeding supplement (ENSURE ENLIVE)  237 mL Oral BID BM  . fluconazole  400 mg Oral Daily  . gabapentin  300 mg Oral BID  . lisinopril  10 mg Oral Daily  . metroNIDAZOLE  500 mg Oral Q8H  . multivitamin with minerals  1 tablet Oral Daily  . pantoprazole  40 mg Oral Daily  . simvastatin  20 mg Oral q1800    Continuous Infusions: . sodium chloride    . sodium chloride    . heparin 900 Units/hr (07/06/17 1030)  . lactated ringers 10 mL/hr at 07/05/17 1005     LOS: 9 days     Elmarie Shiley, MD [facility] Pager [phone]  If 7PM-7AM, please contact night-coverage [email or web address] Password [email or web address] 07/06/2017, 2:11 PM

## 2017-07-06 NOTE — Progress Notes (Signed)
Physical Therapy Treatment Patient Details Name: Anna Hayes MRN: [id number] DOB: 1950-12-13 Today's Date: 07/06/2017    History of Present Illness Pt is a 66 y.o. female with medical history significant of CVA with left arm weakness present to [place] with a sbo after experincing symptoms for ten days.she went to OR on 9/24 had lysis of adhesions and repair of perforated bowel.  Post op she developed AKI with peak cr of 2.1 this stabalized with ivf.  On 9/27 she started developing some fevers and worsening leukocytosis.  ble u/s was done which was neg for DVT.  Ct scan of abd/pelvis showed a developing absess which was drained by IR on 10/1.  She was started on levaquin /flagyl on 9/27.  Blood cultures from 9/27 also growing out fungus therefore casopofugin was started on 10/1 after their doctor consulted with [facility].  On the evening around 9/30 she then developed an ischemic left limb.  A stat cta with run off was performed with showed bilateral occlusion to both legs.  Pt was then arranged to be transferred here to get emergent vascular surgery which she has gotten s/p thrombectomy of bilateral vessels(see vascular report) and left leg fasciotomy. s/p L AKA 07/05/17    PT Comments     Pt is s/p L AKA yesterday and is experiencing increased pain today limiting her mobility. Pt is currently modAx2 for bed mobility and maxAx2 for squat pivot transfer to recliner. Pt requires skilled PT to progress mobility and improve strength and endurance to safely navigate their discharge environment.   Follow Up Recommendations  CIR     Equipment Recommendations  Other (comment) (to be determined at next venue)    Recommendations for Other Services Rehab consult     Precautions / Restrictions Precautions Precautions: None Restrictions Weight Bearing Restrictions: No    Mobility  Bed Mobility Overal bed mobility: Needs Assistance Bed Mobility: Supine to Sit Rolling: Mod assist;+2 for  physical assistance   Supine to sit: Mod assist;+2 for physical assistance;HOB elevated     General bed mobility comments: modAx2 for rolling for pericare, modAx2 for trunk to upright, LE to floor and hip scoot to EoB  Transfers Overall transfer level: Needs assistance Equipment used: 2 person hand held assist Transfers: Squat Pivot Transfers     Squat pivot transfers: Max assist;+2 safety/equipment;+2 physical assistance     General transfer comment: maxAx2 for pivot transfer over to chair, once powerup initiated pt able to assist with R LE extension to clear bottom in pivot         Balance Overall balance assessment: Needs assistance Sitting-balance support: Feet supported;Single extremity supported Sitting balance-Leahy Scale: Poor Sitting balance - Comments: pt requires assist to maintain balance with coming to upright, stating she felt                                     Cognition Arousal/Alertness: Awake/alert Behavior During Therapy: WFL for tasks assessed/performed Overall Cognitive Status: Within Functional Limits for tasks assessed                                           General Comments General comments (skin integrity, edema, etc.): VSS throughout session      Pertinent Vitals/Pain Pain Assessment: 0-10 Pain Score: 7  Pain Location: L  residual limb and R calf Pain Descriptors / Indicators: Aching;Grimacing;Throbbing Pain Intervention(s): Limited activity within patient's tolerance;Monitored during session;Premedicated before session           PT Goals (current goals can now be found in the care plan section) Acute Rehab PT Goals Patient Stated Goal: feel better PT Goal Formulation: With patient Time For Goal Achievement: 07/12/17 Potential to Achieve Goals: Good    Frequency    Min 3X/week      PT Plan Current plan remains appropriate    Co-evaluation PT/OT/SLP Co-Evaluation/Treatment: Yes             AM-PAC PT "6 Clicks" Daily Activity  Outcome Measure  Difficulty turning over in bed (including adjusting bedclothes, sheets and blankets)?: Unable Difficulty moving from lying on back to sitting on the side of the bed? : Unable Difficulty sitting down on and standing up from a chair with arms (e.g., wheelchair, bedside commode, etc,.)?: Unable Help needed moving to and from a bed to chair (including a wheelchair)?: A Lot Help needed walking in hospital room?: Total Help needed climbing 3-5 steps with a railing? : Total 6 Click Score: 7    End of Session Equipment Utilized During Treatment: Gait belt Activity Tolerance: Patient tolerated treatment well Patient left: in chair;with call bell/phone within reach;with chair alarm set Nurse Communication: Mobility status PT Visit Diagnosis: Unsteadiness on feet (R26.81);Other abnormalities of gait and mobility (R26.89);Muscle weakness (generalized) (M62.81);Difficulty in walking, not elsewhere classified (R26.2);Hemiplegia and hemiparesis;Pain Hemiplegia - Right/Left: Left Hemiplegia - dominant/non-dominant: Non-dominant Hemiplegia - caused by: Cerebral infarction Pain - Right/Left: Left Pain - part of body: Ankle and joints of foot;Leg     Time: 1416-1440 PT Time Calculation (min) (ACUTE ONLY): 24 min  Charges:  $Therapeutic Exercise: 23-37 mins                    G Codes:       Anna Hayes B. Migdalia Dk PT, DPT Acute Rehabilitation  [phone] Pager [phone]     [place] 07/06/2017, 5:27 PM

## 2017-07-06 NOTE — Progress Notes (Addendum)
  Progress Note    07/06/2017 7:56 AM 1 Day Post-Op  Subjective:  Says she feels okay this morning  Afebrile HR 80's-100's NSR 09'G-283'M systolic 62% RA  Vitals:   07/06/17 0300 07/06/17 0743  BP: 118/70 107/60  Pulse: 97 85  Resp: 13 16  Temp: 98.5 F (36.9 C) 98.2 F (36.8 C)  SpO2: 96% 97%    Physical Exam: Cardiac:  regular Lungs:  Non labored Extremities:  Easily palpable right DP pulse; left AKA bandage is clean and dry   CBC    Component Value Date/Time   WBC 14.5 (H) 07/05/2017 0226   RBC 3.18 (L) 07/05/2017 0226   HGB 8.2 (L) 07/05/2017 1903   HCT 26.0 (L) 07/05/2017 1903   PLT 317 07/05/2017 0226   MCV 77.7 (L) 07/05/2017 0226   MCH 24.5 (L) 07/05/2017 0226   MCHC 31.6 07/05/2017 0226   RDW 19.5 (H) 07/05/2017 0226    BMET    Component Value Date/Time   NA 137 07/05/2017 0226   K 4.1 07/05/2017 0226   CL 103 07/05/2017 0226   CO2 26 07/05/2017 0226   GLUCOSE 107 (H) 07/05/2017 0226   BUN 5 (L) 07/05/2017 0226   CREATININE 0.75 07/05/2017 0226   CALCIUM 7.4 (L) 07/05/2017 0226   GFRNONAA >60 07/05/2017 0226   GFRAA >60 07/05/2017 0226    INR    Component Value Date/Time   INR 1.31 07/05/2017 0226     Intake/Output Summary (Last 24 hours) at 07/06/17 0756 Last data filed at 07/06/17 0630  Gross per 24 hour  Intake          1488.13 ml  Output              800 ml  Net           688.13 ml     Assessment:  66 y.o. female is s/p:  thrombectomy bilateral lower extremities.  1 Day Post-Op  Plan: -doing well this morning with palpable right DP pulse left AKA bandage is clean and dry -will remove dressing tomorrow    Leontine Locket, PA-C [facility] [phone] 07/06/2017 7:56 AM  Will plan dressing change tomorrow.  Annamarie Major

## 2017-07-06 NOTE — Progress Notes (Signed)
ANTICOAGULATION CONSULT NOTE - Follow Up Consult  Pharmacy Consult for heparin Indication: LE ischemia  Labs:  Recent Labs  07/03/17 1152  07/04/17 0029  07/04/17 1614 07/05/17 0226 07/05/17 1903 07/06/17 0252 07/06/17 0808  HGB  --   < > 7.9*  --   --  7.8* 8.2*  --  7.6*  HCT  --   < > 24.3*  --   --  24.7* 26.0*  --  24.1*  PLT  --   --  301  --   --  317  --   --  345  LABPROT  --   --   --   --   --  16.2*  --   --   --   INR  --   --   --   --   --  1.31  --   --   --   HEPARINUNFRC 0.58  --  0.53  < > 0.55 0.54  --  0.11*  --   CREATININE 0.76  --  0.88  --   --  0.75  --   --  0.68  CKTOTAL 3,206*  --   --   --   --   --   --   --   --   < > = values in this interval not displayed.  Assessment: 66yof s/p ex lap with lysis of adhesions and small bowel perforation repair on 9/24, admitted with ischemic left limb s/p bilateral femoral popliteal embolectomy and left leg fasciotomy on 10/2.   Heparin levels have been supratherapeutic prior to heparin hold for AKA on 10/10. Heparin was restarted after the procedure and ran at fixed rate of 600 units/hr. Heparin level this morning came back at 0.11, below goal range. H/H dropped slightly from yesterday; however, similar to prior to surgery. Platelets are within normal limits. No signs/symptoms of bleeding. Discussed with vascular surgery and okay to resume adjusting heparin infusion.  Goal of Therapy:  Heparin level 0.3-0.5 units/ml   Plan:  Increase heparin infusion to 900 units/hr starting today at 1030  Obtain heparin level 6 hours following rate change Monitor daily heparin level, CBC, s/s of bleed Follow up if plan for oral anticoagulation  Doylene Canard, PharmD Clinical Pharmacist  Phone: [phone] 07/06/2017 10:16 AM

## 2017-07-06 NOTE — Progress Notes (Signed)
Referring Physician(s):  Dr. Donne Hazel  Supervising Physician: Markus Daft  Patient Status:  [facility] - In-pt  Chief Complaint: Intraabdominal abscess  Subjective: Patient s/p L AKA yesterday.  No complaints related to drain.   Allergies: Latex; Codeine; and Penicillins  Medications: Prior to Admission medications   Medication Sig Start Date End Date Taking? Authorizing Provider  amLODipine (NORVASC) 5 MG tablet Take 5 mg by mouth daily.  09/11/13  Yes [provider]  atenolol (TENORMIN) 50 MG tablet Take 25 mg by mouth daily.  08/24/13  Yes [provider]  betamethasone dipropionate (DIPROLENE) 0.05 % cream  04/27/14  Yes [provider]  butalbital-acetaminophen-caffeine (FIORICET, ESGIC) 50-325-40 MG per tablet Take 2 tablets by mouth every 6 (six) hours as needed for headache or migraine.  09/27/13  Yes [provider]  dicyclomine (BENTYL) 10 MG capsule Take 10 mg by mouth 3 (three) times daily before meals.  07/26/13  Yes [provider]  furosemide (LASIX) 20 MG tablet Take 20 mg by mouth daily.  12/17/13  Yes [provider]  gabapentin (NEURONTIN) 300 MG capsule Take 300 mg by mouth 3 (three) times daily.  08/26/13  Yes [provider]  levothyroxine (SYNTHROID, LEVOTHROID) 25 MCG tablet Take 25 mcg by mouth daily.  09/25/13  Yes [provider]  lisinopril (PRINIVIL,ZESTRIL) 10 MG tablet Take 10 mg by mouth daily.  07/22/13  Yes [provider]  omeprazole (PRILOSEC) 40 MG capsule Take 40 mg by mouth daily.  07/26/13  Yes [provider]  simvastatin (ZOCOR) 40 MG tablet Take 40 mg by mouth daily.  07/04/13  Yes [provider]     Vital Signs: BP 120/61 (BP Location: Right Arm)   Pulse (!) 101   Temp 98.6 F (37 C) (Oral)   Resp 14   Ht 5\' 2"  (1.575 m)   Wt 153 lb (69.4 kg)   SpO2 97%   BMI 27.98 kg/m   Physical Exam  NAD, alert Abd: trangluteal drain remains in place  with mostly serous output, slight blood-tinge. 50 mL output documented this AM  Imaging: Ct Abdomen Pelvis W Contrast  Addendum Date: 07/03/2017   ADDENDUM REPORT: 07/03/2017 14:20 ADDENDUM: Small amount of fluid around the right common femoral vessels compatible with recent surgery. No significant lymph node enlargement in the abdomen or pelvis. Electronically Signed   By: Markus Daft M.D.   On: 07/03/2017 14:20   Result Date: 07/03/2017 CLINICAL DATA:  Abdominal pain and fever. History of pelvic fluid collection and status post percutaneous drain. EXAM: CT ABDOMEN AND PELVIS WITH CONTRAST TECHNIQUE: Multidetector CT imaging of the abdomen and pelvis was performed using the standard protocol following bolus administration of intravenous contrast. CONTRAST:  11mL ISOVUE-300 IOPAMIDOL (ISOVUE-300) INJECTION 61% COMPARISON:  06/24/2017 and 06/26/2017 FINDINGS: Lower chest: Mild atelectasis at the lung bases. No pleural effusions. Coronary artery calcifications. Hepatobiliary: Normal appearance of the liver, gallbladder and portal venous system. No biliary dilatation. Pancreas: Normal appearance of the pancreas without inflammation or duct dilatation. Spleen: Normal appearance of spleen without enlargement. Adrenals/Urinary Tract: Stable nodularity in the left adrenal gland. Right adrenal tissue is unremarkable. Low-density structure in the right kidney upper pole likely represents a cyst. This cyst is slightly more dense than would be expected but no significantly change in Hounsfield units on the delayed imaging. No hydronephrosis. Small focus of gas within the urinary bladder. Stomach/Bowel: Normal appearance of stomach. Decreased distension of small bowel loops containing fluid in  left abdomen. No evidence for a high-grade bowel obstruction. Evidence for an appendectomy. Vascular/Lymphatic: Atherosclerotic calcifications in the aorta and iliac arteries without aneurysm. There appears to be blood flow in the  main visceral arteries. Portal venous system and main mesenteric veins are patent. IVC and renal veins are patent. Iliac veins appear to be patent. Reproductive: Uterus has been removed. Again noted is a small cyst or follicle in the right ovary. Pelvic fluid collection is adjacent to left adnexa. Other: Again noted is a right transgluteal pigtail drain within a small pelvic fluid collection. The pelvic fluid collection has decreased in size measuring 2.6 x 5.2 cm and previously measured 3.3 x 8.0 cm. This collection is adjacent to the left adnexa. Again noted is a fluid collection in the anterior lower abdomen which measures 7.4 x 2.9 cm on sequence 3, image 60, previously measured 10.0 x 3.4 cm. The anterior abdominal incision has been opened and the patient now has a wound VAC in this area. There is extensive edematous tissue or fat between the wound VAC and this anterior fluid collection. Again noted is a small amount of fluid just anterior to the left psoas muscle on sequence 3, image 58. Again noted is a small amount of soft tissue gas along the anterior abdominal incision on sequence 3, image 44. There is a fluid collection along the right side of lower abdomen on sequence 3, image 69 that could be associated with bowel. Musculoskeletal: No acute bone abnormality. There is fluid collection just anterior to the left common femoral vessels on sequence 3, image 84. This collection measures 3.4 x 2.7 cm and compatible with recent surgery. There is a small air-fluid collection just below the skin surface in the left inguinal region which is likely postoperative as well. Subcutaneous edema in the abdomen and pelvis. IMPRESSION: Pelvic abscess collection containing a percutaneous drain has decreased in size but there is residual fluid in this collection. There is a residual fluid collection or abscess in the anterior lower abdomen that has decreased in size. Postsurgical changes with a wound VAC in the anterior  lower abdomen. Probable right renal cyst. Small amount of gas in the urinary bladder that could be iatrogenic. Electronically Signed: By: Markus Daft M.D. On: 07/03/2017 13:46    Labs:  CBC:  Recent Labs  07/03/17 0306 07/04/17 0029 07/05/17 0226 07/05/17 1903 07/06/17 0808  WBC 15.4* 15.3* 14.5*  --  18.8*  HGB 7.8* 7.9* 7.8* 8.2* 7.6*  HCT 24.3* 24.3* 24.7* 26.0* 24.1*  PLT 281 301 317  --  345    COAGS:  Recent Labs  06/27/17 1803 06/28/17 0752 06/29/17 1044 07/05/17 0226  INR  --   --  1.14 1.31  APTT 155* 70* 49*  --     BMP:  Recent Labs  07/03/17 1152 07/04/17 0029 07/05/17 0226 07/06/17 0808  NA 135 136 137 136  K 3.6 3.0* 4.1 4.1  CL 100* 100* 103 101  CO2 25 29 26 27   GLUCOSE 87 103* 107* 111*  BUN <5* <5* 5* 7  CALCIUM 7.3* 7.2* 7.4* 7.6*  CREATININE 0.76 0.88 0.75 0.68  GFRNONAA >60 >60 >60 >60  GFRAA >60 >60 >60 >60    LIVER FUNCTION TESTS:  Recent Labs  06/27/17 1550 06/29/17 0500 07/01/17 0621  BILITOT 0.4 0.7 0.6  AST 44* 42* 32  ALT 22 20 17   ALKPHOS 47 51 47  PROT 4.3* 4.8* 5.2*  ALBUMIN 1.4* 1.7* 1.6*  Assessment and Plan: Intra-abdominal abscess s/p drain placement Patient with ongoing serous drainage from transgluteal drain. Increase in drainage volume overnight.  Continue current management with drain care and flushes.  IR to follow.  Electronically Signed: Docia Barrier, PA 07/06/2017, 3:09 PM   I spent a total of 15 Minutes at the the patient's bedside AND on the patient's hospital floor or unit, greater than 50% of which was counseling/coordinating care for abdominal fluid collection.

## 2017-07-06 NOTE — Progress Notes (Signed)
I await postop PT and OT to assist with planning rehab venue. [phone]

## 2017-07-06 NOTE — Progress Notes (Signed)
Patient ID: Tascha Casares, female   DOB: 11-Jul-1951, 66 y.o.   MRN: [id number]  This NP visited patient at the bedside as a follow up to initial  [place].  Patient is alert and oriented and optimistic one day postop for her left AKA.  She tells me she is "ready to some a**!"    -- left AKA dressing is clean and dry and intact --patient denies pain or discomfort.  Emotional support and encouragement offered.  I shared with the patient that I will be out the hospital until Monday but will check in with her at that time. She is encouraged to call the team phone with any questions concerns or needs.  Discussed with patient the importance of continued conversation with family and their  medical providers regarding overall plan of care and treatment options,  ensuring decisions are within the context of the patients values and GOCs.  Time in   0930        Time out    0945  Total time spent on the unit was 15 minutes.  Greater than 50% of the time was spent in counseling and coordination of care  Wadie Lessen NP  Palliative Medicine Team Team Phone # [phone] Pager [phone]

## 2017-07-06 NOTE — Progress Notes (Signed)
ANTICOAGULATION CONSULT NOTE - Follow Up Consult  Pharmacy Consult for Heparin Indication: LE ischemia  Patient Measurements: Height: 5\' 2"  (157.5 cm) Weight: 153 lb (69.4 kg) IBW/kg (Calculated) : 50.1 Heparin Dosing Weight: 69 kg  Labs:  Recent Labs  07/04/17 0029  07/05/17 0226 07/05/17 1903 07/06/17 0252 07/06/17 0808 07/06/17 1536  HGB 7.9*  --  7.8* 8.2*  --  7.6*  --   HCT 24.3*  --  24.7* 26.0*  --  24.1*  --   PLT 301  --  317  --   --  345  --   LABPROT  --   --  16.2*  --   --   --   --   INR  --   --  1.31  --   --   --   --   HEPARINUNFRC 0.53  < > 0.54  --  0.11*  --  0.28*  CREATININE 0.88  --  0.75  --   --  0.68  --   < > = values in this interval not displayed.  Estimated Creatinine Clearance: 61.4 mL/min (by C-G formula based on SCr of 0.68 mg/dL).  Assessment:  66yof s/p ex lap with lysis of adhesions and small bowel perforation repair on 9/24, admitted with ischemic left limb s/p bilateral femoral popliteal embolectomy and left leg fasciotomy on 10/2.   Heparin levels have been supratherapeutic prior to heparin hold for AKA on 10/10. Heparin was restarted after the procedure and ran at fixed rate of 600 units/hr, with low level of 0.11 this am.  Infusion rate increased to 900 units/hr this am, and level is 0.28 this afternoon, just below target range.  No bleeding reported.  Goal of Therapy:  Heparin level 0.3-0.5 units/ml Monitor platelets by anticoagulation protocol: Yes   Plan:   Increase heparin drip to 1000 units/hr.  Next heparin level and CBC in am.  Arty Baumgartner, Physician [facility] Pager: [phone] 07/06/2017,6:06 PM

## 2017-07-06 NOTE — Progress Notes (Signed)
Subjective: No new complaints   Antibiotics:  Anti-infectives    Start     Dose/Rate Route Frequency Ordered Stop   07/06/17 1415  cefpodoxime (VANTIN) tablet 200 mg     200 mg Oral Every 12 hours 07/06/17 1409     07/06/17 1415  fluconazole (DIFLUCAN) tablet 400 mg     400 mg Oral Daily 07/06/17 1410     07/06/17 0600  cefTRIAXone (ROCEPHIN) 2 g in dextrose 5 % 50 mL IVPB  Status:  Discontinued     2 g 100 mL/hr over 30 Minutes Intravenous Every 24 hours 07/05/17 1502 07/06/17 1409   07/05/17 1515  metroNIDAZOLE (FLAGYL) tablet 500 mg     500 mg Oral Every 8 hours 07/05/17 1502     06/29/17 2300  fluconazole (DIFLUCAN) IVPB 400 mg  Status:  Discontinued     400 mg 100 mL/hr over 120 Minutes Intravenous Every 24 hours 06/28/17 1413 07/06/17 1410   06/29/17 2200  metroNIDAZOLE (FLAGYL) tablet 500 mg  Status:  Discontinued     500 mg Oral Every 8 hours 06/29/17 1918 07/05/17 1427   06/29/17 0600  cefTRIAXone (ROCEPHIN) 2 g in dextrose 5 % 50 mL IVPB  Status:  Discontinued     2 g 100 mL/hr over 30 Minutes Intravenous Every 24 hours 06/28/17 1413 07/05/17 1427   06/28/17 2300  fluconazole (DIFLUCAN) IVPB 800 mg     800 mg 200 mL/hr over 120 Minutes Intravenous  Once 06/28/17 1413 06/29/17 0148   06/28/17 2200  anidulafungin (ERAXIS) 100 mg in sodium chloride 0.9 % 100 mL IVPB  Status:  Discontinued     100 mg 78 mL/hr over 100 Minutes Intravenous Daily at bedtime 06/27/17 2324 06/28/17 1413   06/28/17 0600  levofloxacin (LEVAQUIN) IVPB 500 mg  Status:  Discontinued     500 mg 100 mL/hr over 60 Minutes Intravenous Every 24 hours 06/28/17 0006 06/28/17 1413   06/28/17 0100  anidulafungin (ERAXIS) 200 mg in sodium chloride 0.9 % 200 mL IVPB     200 mg 78 mL/hr over 200 Minutes Intravenous  Once 06/28/17 0015 06/28/17 0549   06/27/17 2359  metroNIDAZOLE (FLAGYL) IVPB 500 mg  Status:  Discontinued     500 mg 100 mL/hr over 60 Minutes Intravenous Every 8 hours 06/27/17 2324  06/29/17 1918   06/27/17 2359  levofloxacin (LEVAQUIN) IVPB 500 mg  Status:  Discontinued     500 mg 100 mL/hr over 60 Minutes Intravenous Daily at bedtime 06/27/17 2324 06/28/17 0006   06/27/17 2030  vancomycin (VANCOCIN) IVPB 1000 mg/200 mL premix     1,000 mg 200 mL/hr over 60 Minutes Intravenous Every 12 hours 06/27/17 1741 06/28/17 1110   06/27/17 1345  cefUROXime (ZINACEF) 1.5 g in dextrose 5 % 50 mL IVPB     1.5 g 100 mL/hr over 30 Minutes Intravenous To Surgery 06/27/17 1340 06/27/17 1341      Medications: Scheduled Meds: . amLODipine  5 mg Oral Daily  . cefpodoxime  200 mg Oral Q12H  . docusate sodium  100 mg Oral Daily  . feeding supplement (ENSURE ENLIVE)  237 mL Oral BID BM  . fluconazole  400 mg Oral Daily  . gabapentin  300 mg Oral BID  . lisinopril  10 mg Oral Daily  . metroNIDAZOLE  500 mg Oral Q8H  . multivitamin with minerals  1 tablet Oral Daily  . pantoprazole  40 mg Oral Daily  .  simvastatin  20 mg Oral q1800   Continuous Infusions: . sodium chloride    . sodium chloride    . heparin 900 Units/hr (07/06/17 1030)  . lactated ringers 10 mL/hr at 07/05/17 1005   PRN Meds:.sodium chloride, acetaminophen **OR** acetaminophen, alum & mag hydroxide-simeth, guaiFENesin-dextromethorphan, morphine injection, ondansetron, oxyCODONE-acetaminophen, phenol    Objective: Weight change:   Intake/Output Summary (Last 24 hours) at 07/06/17 1411 Last data filed at 07/06/17 1300  Gross per 24 hour  Intake             1250 ml  Output             1500 ml  Net             -250 ml   Blood pressure 120/61, pulse (!) 101, temperature 98.6 F (37 C), temperature source Oral, resp. rate 14, height 5\' 2"  (1.575 m), weight 153 lb (69.4 kg), SpO2 97 %. Temp:  [98.2 F (36.8 C)-98.6 F (37 C)] 98.6 F (37 C) (10/11 1300) Pulse Rate:  [83-105] 101 (10/11 1300) Resp:  [10-22] 14 (10/11 1300) BP: (101-134)/(54-79) 120/61 (10/11 1300) SpO2:  [95 %-99 %] 97 % (10/11  1300) Weight:  [153 lb (69.4 kg)] 153 lb (69.4 kg) (10/11 0300)  Physical Exam: General: Alert and awake, oriented x3,  HEENT: anicteric sclera, pupils reactive to light and accommodation, EOMI CVS regular rate, normal r,   Chest:  no wheezing, resp distress Abdomen: soft midline bandage  Sp AKA  Neuro: nonfocal  CBC:    BMET  Recent Labs  07/05/17 0226 07/06/17 0808  NA 137 136  K 4.1 4.1  CL 103 101  CO2 26 27  GLUCOSE 107* 111*  BUN 5* 7  CREATININE 0.75 0.68  CALCIUM 7.4* 7.6*     Liver Panel  No results for input(s): PROT, ALBUMIN, AST, ALT, ALKPHOS, BILITOT, BILIDIR, IBILI in the last 72 hours.     Sedimentation Rate No results for input(s): ESRSEDRATE in the last 72 hours. C-Reactive Protein No results for input(s): CRP in the last 72 hours.  Micro Results: Recent Results (from the past 720 hour(s))  Culture, blood (Routine X 2) w Reflex to ID Panel     Status: Abnormal   Collection Time: 06/28/17 11:00 AM  Result Value Ref Range Status   Specimen Description BLOOD LEFT ARM  Final   Special Requests IN PEDIATRIC BOTTLE Blood Culture adequate volume  Final   Culture  Setup Time   Final    GRAM POSITIVE COCCI IN PEDIATRIC BOTTLE CRITICAL RESULT CALLED TO, READ BACK BY AND VERIFIED WITH: N. Batchelder Pharm.D. 9:55 06/29/17 ([email or web address])    Culture (A)  Final    STAPHYLOCOCCUS SPECIES (COAGULASE NEGATIVE) THE SIGNIFICANCE OF ISOLATING THIS ORGANISM FROM A SINGLE SET OF BLOOD CULTURES WHEN MULTIPLE SETS ARE DRAWN IS UNCERTAIN. PLEASE NOTIFY THE MICROBIOLOGY DEPARTMENT WITHIN ONE WEEK IF SPECIATION AND SENSITIVITIES ARE REQUIRED.    Report Status 07/01/2017 FINAL  Final  Blood Culture ID Panel (Reflexed)     Status: Abnormal   Collection Time: 06/28/17 11:00 AM  Result Value Ref Range Status   Enterococcus species NOT DETECTED NOT DETECTED Final   Listeria monocytogenes NOT DETECTED NOT DETECTED Final   Staphylococcus species DETECTED (A) NOT DETECTED  Final    Comment: Methicillin (oxacillin) resistant coagulase negative staphylococcus. Possible blood culture contaminant (unless isolated from more than one blood culture draw or clinical case suggests pathogenicity). No antibiotic treatment is indicated for blood  culture contaminants. CRITICAL RESULT CALLED TO, READ BACK BY AND VERIFIED WITH: N. Batchelder Pharm.D. 9:55 06/29/17 ([email or web address])    Staphylococcus aureus NOT DETECTED NOT DETECTED Final   Methicillin resistance DETECTED (A) NOT DETECTED Final    Comment: CRITICAL RESULT CALLED TO, READ BACK BY AND VERIFIED WITH: N. Batchelder Pharm.D. 9:55 06/29/17 ([email or web address])    Streptococcus species NOT DETECTED NOT DETECTED Final   Streptococcus agalactiae NOT DETECTED NOT DETECTED Final   Streptococcus pneumoniae NOT DETECTED NOT DETECTED Final   Streptococcus pyogenes NOT DETECTED NOT DETECTED Final   Acinetobacter baumannii NOT DETECTED NOT DETECTED Final   Enterobacteriaceae species NOT DETECTED NOT DETECTED Final   Enterobacter cloacae complex NOT DETECTED NOT DETECTED Final   Escherichia coli NOT DETECTED NOT DETECTED Final   Klebsiella oxytoca NOT DETECTED NOT DETECTED Final   Klebsiella pneumoniae NOT DETECTED NOT DETECTED Final   Proteus species NOT DETECTED NOT DETECTED Final   Serratia marcescens NOT DETECTED NOT DETECTED Final   Haemophilus influenzae NOT DETECTED NOT DETECTED Final   Neisseria meningitidis NOT DETECTED NOT DETECTED Final   Pseudomonas aeruginosa NOT DETECTED NOT DETECTED Final   Candida albicans NOT DETECTED NOT DETECTED Final   Candida glabrata NOT DETECTED NOT DETECTED Final   Candida krusei NOT DETECTED NOT DETECTED Final   Candida parapsilosis NOT DETECTED NOT DETECTED Final   Candida tropicalis NOT DETECTED NOT DETECTED Final  Culture, blood (Routine X 2) w Reflex to ID Panel     Status: None   Collection Time: 06/28/17 11:09 AM  Result Value Ref Range Status   Specimen Description BLOOD LEFT HAND  Final    Special Requests IN PEDIATRIC BOTTLE Blood Culture adequate volume  Final   Culture NO GROWTH 5 DAYS  Final   Report Status 07/03/2017 FINAL  Final  Body fluid culture     Status: None   Collection Time: 07/01/17  2:27 PM  Result Value Ref Range Status   Specimen Description FLUID PELVIS RIGHT  Final   Special Requests Normal  Final   Gram Stain   Final    FEW WBC PRESENT, PREDOMINANTLY MONONUCLEAR NO ORGANISMS SEEN    Culture NO GROWTH 3 DAYS  Final   Report Status 07/04/2017 FINAL  Final  MRSA PCR Screening     Status: None   Collection Time: 07/05/17  5:58 AM  Result Value Ref Range Status   MRSA by PCR NEGATIVE NEGATIVE Final    Comment:        The GeneXpert MRSA Assay (FDA approved for NASAL specimens only), is one component of a comprehensive MRSA colonization surveillance program. It is not intended to diagnose MRSA infection nor to guide or monitor treatment for MRSA infections.     Studies/Results: No results found.    Assessment/Plan:  INTERVAL HISTORY: sp amputation  Principal Problem:   Critical lower limb ischemia Active Problems:   PAD (peripheral artery disease) (HCC)   Stroke (HCC)   SBO (small bowel obstruction) (HCC)   Intraabdominal fluid collection absess   Fungemia   Abdominal pain   History of CVA with residual deficit   Tobacco abuse   Acute blood loss anemia   Post-operative pain   Leukocytosis   Ischemic neuropathy of left foot   DNR (do not resuscitate) discussion   Palliative care by specialist   Pressure injury of skin    Anna Hayes is a 66 y.o. female with  Hx of SBO, perforation sp ex lap complicated by abscess, fungemia with  C tropicalis, limb ischemia sp embolectomy and angioplasty  #1 Nonviable foot on the left:  Thankfully sp curative AKA  #2 Abdominal abscess: she is allergic to PCN so I will change to cefpodoxime and flagyl to cover bacterial pathogens in abdomen and continue fluconazole  I would  continue antibacterial and antifungal therapy until abscess is resolved or if not yet resolved on imaging then for at least a month, I would drop the fluconazole to 200mg  once she finishes her 2 week course  #3 Fungemia with C tropicalis: pt needs MINIMUM of 14 days of fluconazole  I have called Dr. Jalene Mullet who is coming to see the patient this afternoon. I have ordered 2 gtt each eye in next 30 mins so her eyes will bee dilated by time she gets here.  Again as above she needs 14 days of high dose azole therapy (through October 16th)  then drop the fluconazole to 200mg  (it can be oral given high bio-availability) and continue this with her antibacterial therap until abscess is resolved or she has had at least a month of therapy  I will followup on Optho exam later today and if not endophthalmitis I will sign off.  I spent greater than 35  minutes with the patient including greater than 50% of time in face to face counsel of the patient guarding or intra-abdominal abscess her fungemia and in coordination of her care with Dr. Tyrell Antonio and Dr. Posey Pronto.         LOS: 9 days   Alcide Evener 07/06/2017, 2:11 PM

## 2017-07-06 NOTE — Care Management Important Message (Signed)
Important Message  Patient Details  Name: Anna Hayes MRN: [id number] Date of Birth: [birth year]   Medicare Important Message Given:  Yes    Nathen May 07/06/2017, 9:38 AM

## 2017-07-07 LAB — CBC
HEMATOCRIT: 25.4 % — AB (ref 36.0–46.0)
HEMOGLOBIN: 8 g/dL — AB (ref 12.0–15.0)
MCH: 24.8 pg — AB (ref 26.0–34.0)
MCHC: 31.5 g/dL (ref 30.0–36.0)
MCV: 78.9 fL (ref 78.0–100.0)
Platelets: 394 10*3/uL (ref 150–400)
RBC: 3.22 MIL/uL — AB (ref 3.87–5.11)
RDW: 19.9 % — ABNORMAL HIGH (ref 11.5–15.5)
WBC: 16.4 10*3/uL — ABNORMAL HIGH (ref 4.0–10.5)

## 2017-07-07 LAB — BASIC METABOLIC PANEL
Anion gap: 9 (ref 5–15)
BUN: 7 mg/dL (ref 6–20)
CHLORIDE: 105 mmol/L (ref 101–111)
CO2: 24 mmol/L (ref 22–32)
CREATININE: 0.72 mg/dL (ref 0.44–1.00)
Calcium: 7.7 mg/dL — ABNORMAL LOW (ref 8.9–10.3)
GFR calc non Af Amer: >60 mL/min (ref >60)
Glucose, Bld: 84 mg/dL (ref 65–99)
Potassium: 3.5 mmol/L (ref 3.5–5.1)
SODIUM: 138 mmol/L (ref 135–145)

## 2017-07-07 LAB — HEPARIN LEVEL (UNFRACTIONATED)
HEPARIN UNFRACTIONATED: 0.65 [IU]/mL (ref 0.30–0.70)
HEPARIN UNFRACTIONATED: 0.62 [IU]/mL (ref 0.30–0.70)

## 2017-07-07 NOTE — Progress Notes (Addendum)
[facility]  Subjective  - Feels better over all and positive out look.   Objective 108/62 92 98.4 F (36.9 C) (Oral) 14 98%  Intake/Output Summary (Last 24 hours) at 07/07/17 0744 Last data filed at 07/07/17 0453  Gross per 24 hour  Intake           899.33 ml  Output             1900 ml  Net         -1000.67 ml    Left AKA site with incisional wound vac, skin clean and dry, no erythema. Wound vac on abdominal wound intact Lungs non labored breathing Heart RRR   Assessment/Planning: POD # 2 PROCEDURE: 1.  left above-the-knee amputation 2.  Placement of negative pressure dressing on incision  Maintain left AKA wound vac for 7 days or until D/C  Laurence Slate [facility] 07/07/2017 7:44 AM --  Laboratory Lab Results:  Recent Labs  07/05/17 0226 07/05/17 1903 07/06/17 0808  WBC 14.5*  --  18.8*  HGB 7.8* 8.2* 7.6*  HCT 24.7* 26.0* 24.1*  PLT 317  --  345   BMET  Recent Labs  07/05/17 0226 07/06/17 0808  NA 137 136  K 4.1 4.1  CL 103 101  CO2 26 27  GLUCOSE 107* 111*  BUN 5* 7  CREATININE 0.75 0.68  CALCIUM 7.4* 7.6*    COAG Lab Results  Component Value Date   INR 1.31 07/05/2017   INR 1.14 06/29/2017   No results found for: PTT   She looks much better today.  She is motivated to get better.  Vac in place on L AKA stump.   WElls Izekiel Flegel

## 2017-07-07 NOTE — Progress Notes (Signed)
Occupational Therapy Treatment and Re-evaluation Patient Details Name: Anna Hayes MRN: [id number] DOB: 1951/06/27 Today's Date: 07/07/2017    History of present illness Pt is a 66 y.o. female with medical history significant of CVA with left arm weakness present to [place] with a sbo after experincing symptoms for ten days.she went to OR on 9/24 had lysis of adhesions and repair of perforated bowel.  Post op she developed AKI with peak cr of 2.1 this stabalized with ivf.  On 9/27 she started developing some fevers and worsening leukocytosis.  ble u/s was done which was neg for DVT.  Ct scan of abd/pelvis showed a developing absess which was drained by IR on 10/1.  She was started on levaquin /flagyl on 9/27.  Blood cultures from 9/27 also growing out fungus therefore casopofugin was started on 10/1 after their doctor consulted with [facility].  On the evening around 9/30 she then developed an ischemic left limb.  A stat cta with run off was performed with showed bilateral occlusion to both legs.  Pt was then arranged to be transferred here to get emergent vascular surgery which she has gotten s/p thrombectomy of bilateral vessels(see vascular report) and left leg fasciotomy. s/p L AKA 07/05/17   OT comments  Pt now with Lt AKA.  She was reassessed, and demonstrates the below listed deficits. She requires mod - total A for ADLs, mod A for bed mobility and max A to stand.  She is very motivated to regain her independence, and has suppportive family.  Feel she would benefit from CIR to allow her to return home with family support, reduce risk of injury or illness and readmission.  Goals and treatment plan remain appropriate.   Follow Up Recommendations  CIR;Supervision/Assistance - 24 hour    Equipment Recommendations  None recommended by OT    Recommendations for Other Services Rehab consult    Precautions / Restrictions Precautions Precautions: None       Mobility Bed  Mobility Overal bed mobility: Needs Assistance Bed Mobility: Supine to Sit;Sit to Supine Rolling: Mod assist   Supine to sit: Mod assist Sit to supine: Mod assist   General bed mobility comments: Pt requires increased time for all aspects.  She requires assist move LEs off bed, lift trunk and scoot   Transfers Overall transfer level: Needs assistance Equipment used: 1 person hand held assist Transfers: Sit to/from Stand Sit to Stand: Max assist         General transfer comment: Pt requires assist for forward translation of trunk and assist to lift buttocks from bed     Balance Overall balance assessment: Needs assistance Sitting-balance support: Feet supported;Single extremity supported Sitting balance-Leahy Scale: Poor Sitting balance - Comments: Pt initially required min A, but progressed to min guard assist with UE support    Standing balance support: Bilateral upper extremity supported Standing balance-Leahy Scale: Poor Standing balance comment: Pt requires max A to maintain standing balance.                            ADL either performed or assessed with clinical judgement   ADL Overall ADL's : Needs assistance/impaired Eating/Feeding: Set up;Sitting   Grooming: Set up;Sitting   Upper Body Bathing: Min guard;Sitting   Lower Body Bathing: Maximal assistance;Sitting/lateral leans;Sit to/from stand   Upper Body Dressing : Minimal assistance;Sitting   Lower Body Dressing: Total assistance;Sit to/from stand;Sitting/lateral leans   Toilet Transfer: Total assistance Toilet  Transfer Details (indicate cue type and reason): uanble  Toileting- Clothing Manipulation and Hygiene: Total assistance;Sit to/from stand       Functional mobility during ADLs: Maximal assistance;Total assistance General ADL Comments: Pt is extremely motivated to regain independence.  Limited this date due to pain, and impaired balance due to new AKA      Vision        Perception     Praxis      Cognition Arousal/Alertness: Awake/alert Behavior During Therapy: WFL for tasks assessed/performed Overall Cognitive Status: Within Functional Limits for tasks assessed                                          Exercises     Shoulder Instructions       General Comments HR to 135 with activity     Pertinent Vitals/ Pain       Pain Assessment: Faces Faces Pain Scale: Hurts whole lot Pain Location: Lt residucal limb  Pain Descriptors / Indicators: Operative site guarding;Grimacing Pain Intervention(s): Monitored during session;Limited activity within patient's tolerance;Patient requesting pain meds-RN notified  Home Living                                          Prior Functioning/Environment              Frequency  Min 2X/week        Progress Toward Goals  OT Goals(current goals can now be found in the care plan section)  Progress towards OT goals: Progressing toward goals     Plan Discharge plan remains appropriate    Co-evaluation                 AM-PAC PT "6 Clicks" Daily Activity     Outcome Measure   Help from another person eating meals?: A Little Help from another person taking care of personal grooming?: A Little Help from another person toileting, which includes using toliet, bedpan, or urinal?: A Lot Help from another person bathing (including washing, rinsing, drying)?: A Lot Help from another person to put on and taking off regular upper body clothing?: A Lot Help from another person to put on and taking off regular lower body clothing?: Total 6 Click Score: 13    End of Session Equipment Utilized During Treatment: Oxygen  OT Visit Diagnosis: Unsteadiness on feet (R26.81);Pain Pain - Right/Left: Left Pain - part of body: Leg   Activity Tolerance Patient tolerated treatment well   Patient Left in bed;with bed alarm set;with call bell/phone within reach;with  family/visitor present   Nurse Communication Mobility status;Patient requests pain meds        Time: 1448-1856 OT Time Calculation (min): 39 min  Charges: OT General Charges $OT Visit: 1 Visit OT Evaluation $OT Re-eval: 1 Re-eval OT Treatments $Therapeutic Activity: 23-37 mins  [facility], OTR/L [phone]    Lucille Passy M 07/07/2017, 6:49 PM

## 2017-07-07 NOTE — Progress Notes (Signed)
Pt care assumed from [facility]. Pt resting comfortably, daughter at bedside. Pt denies needs. Will continue to monitor.   Fritz Pickerel, RN

## 2017-07-07 NOTE — Progress Notes (Signed)
PROGRESS NOTE  Margaux Engen OIN:[id number] DOB: 1950/12/12 DOA: 06/27/2017 PCP: Rochel Brome, MD  HPI/Recap of past 50 hours: 66 year old woman presented with abdominal pain and was admitted to [facility] 9/22. Found to have small bowel obstruction. Underwent expiratory laparotomy with lysis of adhesions and release of small bowel obstruction, repair of small bowel perforation. Subsequently had fevers, leukocytosis and CT abdomen and pelvis 9/29 revealed pelvic abscess and underwent successful placement of percutaneous drain October 1. Also noted to have fungemia on 1/2 blood culture 9/27 and started on antifungal therapy. She subsequently developed left lower stomach pain and discomfort October 1 and underwent CT angiogram which revealed bilateral arterial blockage both lower extremities, was started on heparin and emergently transferred to [facility] where she underwent thrombectomy and fasciotomy October 2.  Since then, vascular surgery has felt that her left foot is nonviable and left BKA has been recommended, but patient is having difficulty making that decision. Palliative medicine was consulted and patient met with them and family today for goals of care discussion.  After this meeting, patient has opted for surgery.  Subjective;  She denies worsening abdominal pain. Left BKA pain controlled.   Assessment/Plan: Critical left leg ischemia, status post bilateral femoral embolectomy, angioplasty left posterior tibial artery, fasciotomy left leg 10/2. -VVS managing. Left foot nonviable. Patient now amenable to left BKA. Underwent surgery on 10/10 -repeat cbc tonight./  -Heparin per vascular. Will follow vascular recommendation for oral anticoagulation.   Status post laparotomy, lysis of adhesions and repair of perforated bowel 9/24 at [facility]. -per general surgery. Wound VAC.  Abdominal abscess, status post percutaneous drain placement October 1. On treatment with Levaquin  and Flagyl since 9/27. -WBC slowly trending down. Continue abx as per ID  CT done 10/8 notes persistent fluid collections.   Surgery recommend to continue with wound vac until follow up with outpatient surgeon.  -Treatment recommendation  Per ID; Needs 14 days of Azole therapy (through Oct 16) then drop the fluconazole to 200 mg ( it can be oral ) continue with antibacterial therapy until abscess is resolved or she has had at least a month of therapy.  Antibiotics change to cefopoxime and flagyl.   Fungemia Candida tropicalis  -continue fluconazole per ID total 2 weeks.  ?longer -Appreciate Dr Tommy Medal, he has consulted opthalmology, to exclude fungal endolpthalmitis repeated blood culture 10-03 negative.   Needs 14 days of Azole therapy (through Oct 16) then drop the fluconazole to 200 mg ( it can be oral )   Hypoalbuminemia.  -diet per surgery .  -supplements per dietician   anemia, likely multifactorial including perioperative blood loss. S/p 2 units PRBC 10/2. -Hgb stable.   Start nu iron   Obesity unspecified: meets criteria BMI greater than 30  Status post AKI: Now resolved  PMH stroke with left upper and left lower extremity weakness, left facial droop  Code Status: Full code   Family Communication: patient.   Disposition Plan: CIR consulted. Needs clearance from vascular and Surgery    Consultants: Vascular surgery General surgery Infectious disease Interventional radiology Physical medicine and rehabilitation   Procedures:  9/24: Exploratory laparoscopy with lysis of adhesions and release of small bowel obstruction, repair of small bowel perforation  10/2: Transfused 2 units packed red blood cells  10/1: Percutaneous drain placement and right buttock for pelvic abscess  10/2: Bilateral femoral popliteal embolectomy, left posterior tibial artery patch angioplasty and left lower leg fasciotomy   10/10: Planned amputation  Antimicrobials:  Anidulafungin  10/2 >>10/3 Fluconazole 10/3 >> 10/17 Levaquin 10/2 >>ceftriaxone   Metronidazole 10/2 >>   DVT prophylaxis:  Heparin   Objective: Vitals:   07/06/17 2024 07/07/17 0010 07/07/17 0451 07/07/17 0800  BP: 116/62 119/63 108/62 134/69  Pulse: 87 68 92 99  Resp: '16 14 14 18  ' Temp: 98.3 F (36.8 C) 98.3 F (36.8 C) 98.4 F (36.9 C)   TempSrc: Oral Oral Oral Oral  SpO2: 98% 100% 98% 96%  Weight:      Height:        Intake/Output Summary (Last 24 hours) at 07/07/17 0810 Last data filed at 07/07/17 0453  Gross per 24 hour  Intake           893.33 ml  Output             1600 ml  Net          -706.67 ml   Filed Weights   06/27/17 1142 07/01/17 0526 07/06/17 0300  Weight: 79.6 kg (175 lb 8 oz) 79.4 kg (175 lb) 69.4 kg (153 lb)    Exam:   General: NAD   HEENT:MMM  Neck: Supple , No JVD  Cardiovascular: S 1, S 2 RRR  Respiratory: CTA  Abdomen: Soft, NT, ND  Musculoskeletal; No cyanosis.   Skin: Left AKA with clean dressing.      Data Reviewed: CBC:  Recent Labs Lab 07/02/17 0333 07/03/17 0306 07/04/17 0029 07/05/17 0226 07/05/17 1903 07/06/17 0808  WBC 16.3* 15.4* 15.3* 14.5*  --  18.8*  HGB 7.7* 7.8* 7.9* 7.8* 8.2* 7.6*  HCT 24.0* 24.3* 24.3* 24.7* 26.0* 24.1*  MCV 76.9* 76.7* 77.1* 77.7*  --  77.7*  PLT 298 281 301 317  --  094   Basic Metabolic Panel:  Recent Labs Lab 07/01/17 0621 07/03/17 1152 07/04/17 0029 07/04/17 1102 07/05/17 0226 07/06/17 0808  NA 136 135 136  --  137 136  K 4.1 3.6 3.0*  --  4.1 4.1  CL 106 100* 100*  --  103 101  CO2 21* 25 29  --  26 27  GLUCOSE 91 87 103*  --  107* 111*  BUN <5* <5* <5*  --  5* 7  CREATININE 0.81 0.76 0.88  --  0.75 0.68  CALCIUM 7.0* 7.3* 7.2*  --  7.4* 7.6*  MG 1.7  --   --  1.7  --   --    GFR: Estimated Creatinine Clearance: 61.4 mL/min (by C-G formula based on SCr of 0.68 mg/dL). Liver Function Tests:  Recent Labs Lab 07/01/17 0621  AST 32  ALT 17  ALKPHOS 47  BILITOT 0.6    PROT 5.2*  ALBUMIN 1.6*   No results for input(s): LIPASE, AMYLASE in the last 168 hours. No results for input(s): AMMONIA in the last 168 hours. Coagulation Profile:  Recent Labs Lab 07/05/17 0226  INR 1.31   Cardiac Enzymes:  Recent Labs Lab 07/03/17 1152  CKTOTAL 3,206*   BNP (last 3 results) No results for input(s): PROBNP in the last 8760 hours. HbA1C: No results for input(s): HGBA1C in the last 72 hours. CBG: No results for input(s): GLUCAP in the last 168 hours. Lipid Profile: No results for input(s): CHOL, HDL, LDLCALC, TRIG, CHOLHDL, LDLDIRECT in the last 72 hours. Thyroid Function Tests: No results for input(s): TSH, T4TOTAL, FREET4, T3FREE, THYROIDAB in the last 72 hours. Anemia Panel: No results for input(s): VITAMINB12, FOLATE, FERRITIN, TIBC, IRON, RETICCTPCT in the last 72 hours. Urine  analysis: No results found for: COLORURINE, APPEARANCEUR, LABSPEC, [place], GLUCOSEU, HGBUR, BILIRUBINUR, KETONESUR, PROTEINUR, UROBILINOGEN, NITRITE, LEUKOCYTESUR Sepsis Labs: '@LABRCNTIP' (procalcitonin:4,lacticidven:4)  ) Recent Results (from the past 240 hour(s))  Culture, blood (Routine X 2) w Reflex to ID Panel     Status: Abnormal   Collection Time: 06/28/17 11:00 AM  Result Value Ref Range Status   Specimen Description BLOOD LEFT ARM  Final   Special Requests IN PEDIATRIC BOTTLE Blood Culture adequate volume  Final   Culture  Setup Time   Final    GRAM POSITIVE COCCI IN PEDIATRIC BOTTLE CRITICAL RESULT CALLED TO, READ BACK BY AND VERIFIED WITH: N. Batchelder Pharm.D. 9:55 06/29/17 ([email or web address])    Culture (A)  Final    STAPHYLOCOCCUS SPECIES (COAGULASE NEGATIVE) THE SIGNIFICANCE OF ISOLATING THIS ORGANISM FROM A SINGLE SET OF BLOOD CULTURES WHEN MULTIPLE SETS ARE DRAWN IS UNCERTAIN. PLEASE NOTIFY THE MICROBIOLOGY DEPARTMENT WITHIN ONE WEEK IF SPECIATION AND SENSITIVITIES ARE REQUIRED.    Report Status 07/01/2017 FINAL  Final  Blood Culture ID Panel (Reflexed)      Status: Abnormal   Collection Time: 06/28/17 11:00 AM  Result Value Ref Range Status   Enterococcus species NOT DETECTED NOT DETECTED Final   Listeria monocytogenes NOT DETECTED NOT DETECTED Final   Staphylococcus species DETECTED (A) NOT DETECTED Final    Comment: Methicillin (oxacillin) resistant coagulase negative staphylococcus. Possible blood culture contaminant (unless isolated from more than one blood culture draw or clinical case suggests pathogenicity). No antibiotic treatment is indicated for blood  culture contaminants. CRITICAL RESULT CALLED TO, READ BACK BY AND VERIFIED WITH: N. Batchelder Pharm.D. 9:55 06/29/17 ([email or web address])    Staphylococcus aureus NOT DETECTED NOT DETECTED Final   Methicillin resistance DETECTED (A) NOT DETECTED Final    Comment: CRITICAL RESULT CALLED TO, READ BACK BY AND VERIFIED WITH: N. Batchelder Pharm.D. 9:55 06/29/17 ([email or web address])    Streptococcus species NOT DETECTED NOT DETECTED Final   Streptococcus agalactiae NOT DETECTED NOT DETECTED Final   Streptococcus pneumoniae NOT DETECTED NOT DETECTED Final   Streptococcus pyogenes NOT DETECTED NOT DETECTED Final   Acinetobacter baumannii NOT DETECTED NOT DETECTED Final   Enterobacteriaceae species NOT DETECTED NOT DETECTED Final   Enterobacter cloacae complex NOT DETECTED NOT DETECTED Final   Escherichia coli NOT DETECTED NOT DETECTED Final   Klebsiella oxytoca NOT DETECTED NOT DETECTED Final   Klebsiella pneumoniae NOT DETECTED NOT DETECTED Final   Proteus species NOT DETECTED NOT DETECTED Final   Serratia marcescens NOT DETECTED NOT DETECTED Final   Haemophilus influenzae NOT DETECTED NOT DETECTED Final   Neisseria meningitidis NOT DETECTED NOT DETECTED Final   Pseudomonas aeruginosa NOT DETECTED NOT DETECTED Final   Candida albicans NOT DETECTED NOT DETECTED Final   Candida glabrata NOT DETECTED NOT DETECTED Final   Candida krusei NOT DETECTED NOT DETECTED Final   Candida parapsilosis NOT DETECTED NOT  DETECTED Final   Candida tropicalis NOT DETECTED NOT DETECTED Final  Culture, blood (Routine X 2) w Reflex to ID Panel     Status: None   Collection Time: 06/28/17 11:09 AM  Result Value Ref Range Status   Specimen Description BLOOD LEFT HAND  Final   Special Requests IN PEDIATRIC BOTTLE Blood Culture adequate volume  Final   Culture NO GROWTH 5 DAYS  Final   Report Status 07/03/2017 FINAL  Final  Body fluid culture     Status: None   Collection Time: 07/01/17  2:27 PM  Result Value Ref Range Status   Specimen Description FLUID PELVIS  RIGHT  Final   Special Requests Normal  Final   Gram Stain   Final    FEW WBC PRESENT, PREDOMINANTLY MONONUCLEAR NO ORGANISMS SEEN    Culture NO GROWTH 3 DAYS  Final   Report Status 07/04/2017 FINAL  Final  MRSA PCR Screening     Status: None   Collection Time: 07/05/17  5:58 AM  Result Value Ref Range Status   MRSA by PCR NEGATIVE NEGATIVE Final    Comment:        The GeneXpert MRSA Assay (FDA approved for NASAL specimens only), is one component of a comprehensive MRSA colonization surveillance program. It is not intended to diagnose MRSA infection nor to guide or monitor treatment for MRSA infections.       Studies: No results found.  Scheduled Meds: . amLODipine  5 mg Oral Daily  . cefpodoxime  200 mg Oral Q12H  . docusate sodium  100 mg Oral Daily  . feeding supplement (ENSURE ENLIVE)  237 mL Oral BID BM  . fluconazole  400 mg Oral Daily  . gabapentin  300 mg Oral BID  . iron polysaccharides  150 mg Oral Daily  . metroNIDAZOLE  500 mg Oral Q8H  . multivitamin with minerals  1 tablet Oral Daily  . pantoprazole  40 mg Oral Daily  . simvastatin  20 mg Oral q1800    Continuous Infusions: . sodium chloride    . sodium chloride    . heparin 1,000 Units/hr (07/07/17 0323)  . lactated ringers 10 mL/hr at 07/05/17 1005     LOS: 10 days     Elmarie Shiley, MD [facility] Pager [phone]  If 7PM-7AM, please  contact night-coverage [email or web address] Password [email or web address] 07/07/2017, 8:10 AM

## 2017-07-07 NOTE — Progress Notes (Signed)
Patient ID: Anna Hayes, female   DOB: 1951-08-14, 66 y.o.   MRN: [id number]    Referring Physician(s): Dr. Rolm Bookbinder  Supervising Physician: Aletta Edouard  Patient Status: [facility] - In-pt  Chief Complaint: Pelvic abscess  Subjective: Patient with no new complaints regarding her abdomen or her drain.  Allergies: Latex; Codeine; and Penicillins  Medications: Prior to Admission medications   Medication Sig Start Date End Date Taking? Authorizing Provider  amLODipine (NORVASC) 5 MG tablet Take 5 mg by mouth daily.  09/11/13  Yes [provider]  atenolol (TENORMIN) 50 MG tablet Take 25 mg by mouth daily.  08/24/13  Yes [provider]  betamethasone dipropionate (DIPROLENE) 0.05 % cream  04/27/14  Yes [provider]  butalbital-acetaminophen-caffeine (FIORICET, ESGIC) 50-325-40 MG per tablet Take 2 tablets by mouth every 6 (six) hours as needed for headache or migraine.  09/27/13  Yes [provider]  dicyclomine (BENTYL) 10 MG capsule Take 10 mg by mouth 3 (three) times daily before meals.  07/26/13  Yes [provider]  furosemide (LASIX) 20 MG tablet Take 20 mg by mouth daily.  12/17/13  Yes [provider]  gabapentin (NEURONTIN) 300 MG capsule Take 300 mg by mouth 3 (three) times daily.  08/26/13  Yes [provider]  levothyroxine (SYNTHROID, LEVOTHROID) 25 MCG tablet Take 25 mcg by mouth daily.  09/25/13  Yes [provider]  lisinopril (PRINIVIL,ZESTRIL) 10 MG tablet Take 10 mg by mouth daily.  07/22/13  Yes [provider]  omeprazole (PRILOSEC) 40 MG capsule Take 40 mg by mouth daily.  07/26/13  Yes [provider]  simvastatin (ZOCOR) 40 MG tablet Take 40 mg by mouth daily.  07/04/13  Yes [provider]    Vital Signs: BP 140/65   Pulse (!) 104   Temp 97.8 F (36.6 C) (Oral)   Resp 15   Ht 5\' 2"  (1.575 m)   Wt 153 lb (69.4 kg)   SpO2 97%   BMI 27.98 kg/m    Physical Exam: Abd: right TG drain intact with almost no output.  Drain was copiously irrigated at the request of Dr. Anselm Pancoast after her last CT scan.  Pull back revealed only a cc or 2 of output.  The drain flushes, but causes some discomfort after about 5cc flushed.  Drain site is c/d/i  Imaging: Ct Abdomen Pelvis W Contrast  Addendum Date: 07/03/2017   ADDENDUM REPORT: 07/03/2017 14:20 ADDENDUM: Small amount of fluid around the right common femoral vessels compatible with recent surgery. No significant lymph node enlargement in the abdomen or pelvis. Electronically Signed   By: Markus Daft M.D.   On: 07/03/2017 14:20   Result Date: 07/03/2017 CLINICAL DATA:  Abdominal pain and fever. History of pelvic fluid collection and status post percutaneous drain. EXAM: CT ABDOMEN AND PELVIS WITH CONTRAST TECHNIQUE: Multidetector CT imaging of the abdomen and pelvis was performed using the standard protocol following bolus administration of intravenous contrast. CONTRAST:  174mL ISOVUE-300 IOPAMIDOL (ISOVUE-300) INJECTION 61% COMPARISON:  06/24/2017 and 06/26/2017 FINDINGS: Lower chest: Mild atelectasis at the lung bases. No pleural effusions. Coronary artery calcifications. Hepatobiliary: Normal appearance of the liver, gallbladder and portal venous system. No biliary dilatation. Pancreas: Normal appearance of the pancreas without inflammation or duct dilatation. Spleen: Normal appearance of spleen without enlargement. Adrenals/Urinary Tract: Stable nodularity in the left adrenal gland. Right adrenal tissue is unremarkable. Low-density structure in the right kidney upper pole likely represents a cyst. This cyst is  slightly more dense than would be expected but no significantly change in Hounsfield units on the delayed imaging. No hydronephrosis. Small focus of gas within the urinary bladder. Stomach/Bowel: Normal appearance of stomach. Decreased distension of small bowel loops containing fluid in left abdomen. No  evidence for a high-grade bowel obstruction. Evidence for an appendectomy. Vascular/Lymphatic: Atherosclerotic calcifications in the aorta and iliac arteries without aneurysm. There appears to be blood flow in the main visceral arteries. Portal venous system and main mesenteric veins are patent. IVC and renal veins are patent. Iliac veins appear to be patent. Reproductive: Uterus has been removed. Again noted is a small cyst or follicle in the right ovary. Pelvic fluid collection is adjacent to left adnexa. Other: Again noted is a right transgluteal pigtail drain within a small pelvic fluid collection. The pelvic fluid collection has decreased in size measuring 2.6 x 5.2 cm and previously measured 3.3 x 8.0 cm. This collection is adjacent to the left adnexa. Again noted is a fluid collection in the anterior lower abdomen which measures 7.4 x 2.9 cm on sequence 3, image 60, previously measured 10.0 x 3.4 cm. The anterior abdominal incision has been opened and the patient now has a wound VAC in this area. There is extensive edematous tissue or fat between the wound VAC and this anterior fluid collection. Again noted is a small amount of fluid just anterior to the left psoas muscle on sequence 3, image 58. Again noted is a small amount of soft tissue gas along the anterior abdominal incision on sequence 3, image 44. There is a fluid collection along the right side of lower abdomen on sequence 3, image 69 that could be associated with bowel. Musculoskeletal: No acute bone abnormality. There is fluid collection just anterior to the left common femoral vessels on sequence 3, image 84. This collection measures 3.4 x 2.7 cm and compatible with recent surgery. There is a small air-fluid collection just below the skin surface in the left inguinal region which is likely postoperative as well. Subcutaneous edema in the abdomen and pelvis. IMPRESSION: Pelvic abscess collection containing a percutaneous drain has decreased in  size but there is residual fluid in this collection. There is a residual fluid collection or abscess in the anterior lower abdomen that has decreased in size. Postsurgical changes with a wound VAC in the anterior lower abdomen. Probable right renal cyst. Small amount of gas in the urinary bladder that could be iatrogenic. Electronically Signed: By: Markus Daft M.D. On: 07/03/2017 13:46    Labs:  CBC:  Recent Labs  07/03/17 0306 07/04/17 0029 07/05/17 0226 07/05/17 1903 07/06/17 0808  WBC 15.4* 15.3* 14.5*  --  18.8*  HGB 7.8* 7.9* 7.8* 8.2* 7.6*  HCT 24.3* 24.3* 24.7* 26.0* 24.1*  PLT 281 301 317  --  345    COAGS:  Recent Labs  06/27/17 1803 06/28/17 0752 06/29/17 1044 07/05/17 0226  INR  --   --  1.14 1.31  APTT 155* 70* 49*  --     BMP:  Recent Labs  07/03/17 1152 07/04/17 0029 07/05/17 0226 07/06/17 0808  NA 135 136 137 136  K 3.6 3.0* 4.1 4.1  CL 100* 100* 103 101  CO2 25 29 26 27   GLUCOSE 87 103* 107* 111*  BUN <5* <5* 5* 7  CALCIUM 7.3* 7.2* 7.4* 7.6*  CREATININE 0.76 0.88 0.75 0.68  GFRNONAA >60 >60 >60 >60  GFRAA >60 >60 >60 >60    LIVER FUNCTION TESTS:  Recent Labs  06/27/17 1550 06/29/17 0500 07/01/17 0621  BILITOT 0.4 0.7 0.6  AST 44* 42* 32  ALT 22 20 17   ALKPHOS 47 51 47  PROT 4.3* 4.8* 5.2*  ALBUMIN 1.4* 1.7* 1.6*    Assessment and Plan: 1. Pelvic abscess, s/p perc drain at [facility] by Dr. Stephenie Acres with minimal output.  Minimal output with manual irrigation and suction from a syringe.  Given her low output, we should consider a repeat CT scan with in the next 1-2 days to determine if her drain can be removed.  Electronically Signed: Henreitta Cea 07/07/2017, 9:45 AM   I spent a total of 15 Minutes at the the patient's bedside AND on the patient's hospital floor or unit, greater than 50% of which was counseling/coordinating care for pelvic abscess

## 2017-07-07 NOTE — Progress Notes (Signed)
ANTICOAGULATION CONSULT NOTE - Follow Up Consult  Pharmacy Consult for Heparin Indication: LE ischemia  Patient Measurements: Height: 5\' 2"  (157.5 cm) Weight: 153 lb (69.4 kg) IBW/kg (Calculated) : 50.1 Heparin Dosing Weight: 69 kg  Labs:  Recent Labs  07/05/17 0226 07/05/17 1903 07/06/17 0252 07/06/17 0808 07/06/17 1536 07/07/17 0905  HGB 7.8* 8.2*  --  7.6*  --  8.0*  HCT 24.7* 26.0*  --  24.1*  --  25.4*  PLT 317  --   --  345  --  394  LABPROT 16.2*  --   --   --   --   --   INR 1.31  --   --   --   --   --   HEPARINUNFRC 0.54  --  0.11*  --  0.28* 0.65  CREATININE 0.75  --   --  0.68  --  0.72    Estimated Creatinine Clearance: 61.4 mL/min (by C-G formula based on SCr of 0.72 mg/dL).  Assessment:  66yof s/p ex lap with lysis of adhesions and small bowel perforation repair on 9/24, admitted with ischemic left limb s/p bilateral femoral popliteal embolectomy and left leg fasciotomy on 10/2.   Heparin was restarted after BKA on 10/10 - heparin levels were low, this morning level was elevated at 0.65 units/mL on 1000 units/hr. Hgb 8, plts 394- no bleeding noted.  Goal of Therapy:  Heparin level 0.3-0.5 units/ml Monitor platelets by anticoagulation protocol: Yes   Plan:  Decrease heparin drip to 950 units/hr. Heparin level in 6 hours Daily heparin level and CBC  Sreekar Broyhill D. Jenniefer Salak, PharmD, [place] Clinical Pharmacist Pager: [phone] Clinical Phone for 07/07/2017 until 3:30pm: [phone] If after 3:30pm, please call main pharmacy at [phone] 07/07/2017 11:08 AM

## 2017-07-07 NOTE — Progress Notes (Signed)
Phone call received by Network engineer and message forwarded to the nurse. Someone called from [phone] and asked that the MD be paged and call him/her back at the given number. No name or relation to patient was given. RN attempted to call this number to gain more information. The call was forwarded to voicemail automatically with no voicemail message stating the caller's name. No message was left but this RN thought it pertinent that this number be recorded should a MD/RN choose to call them in the AM or if family should be informed of caller's request.

## 2017-07-07 NOTE — Progress Notes (Signed)
Md ordered eye drops to be given cyclodryl.   Sanskriti Greenlaw, Mervin Kung RN

## 2017-07-07 NOTE — Progress Notes (Addendum)
Subjective: No new complaints   Antibiotics:  Anti-infectives    Start     Dose/Rate Route Frequency Ordered Stop   07/07/17 0600  cefpodoxime (VANTIN) tablet 200 mg     200 mg Oral Every 12 hours 07/06/17 1409     07/06/17 1415  fluconazole (DIFLUCAN) tablet 400 mg     400 mg Oral Daily 07/06/17 1410     07/06/17 0600  cefTRIAXone (ROCEPHIN) 2 g in dextrose 5 % 50 mL IVPB  Status:  Discontinued     2 g 100 mL/hr over 30 Minutes Intravenous Every 24 hours 07/05/17 1502 07/06/17 1409   07/05/17 1515  metroNIDAZOLE (FLAGYL) tablet 500 mg     500 mg Oral Every 8 hours 07/05/17 1502     06/29/17 2300  fluconazole (DIFLUCAN) IVPB 400 mg  Status:  Discontinued     400 mg 100 mL/hr over 120 Minutes Intravenous Every 24 hours 06/28/17 1413 07/06/17 1410   06/29/17 2200  metroNIDAZOLE (FLAGYL) tablet 500 mg  Status:  Discontinued     500 mg Oral Every 8 hours 06/29/17 1918 07/05/17 1427   06/29/17 0600  cefTRIAXone (ROCEPHIN) 2 g in dextrose 5 % 50 mL IVPB  Status:  Discontinued     2 g 100 mL/hr over 30 Minutes Intravenous Every 24 hours 06/28/17 1413 07/05/17 1427   06/28/17 2300  fluconazole (DIFLUCAN) IVPB 800 mg     800 mg 200 mL/hr over 120 Minutes Intravenous  Once 06/28/17 1413 06/29/17 0148   06/28/17 2200  anidulafungin (ERAXIS) 100 mg in sodium chloride 0.9 % 100 mL IVPB  Status:  Discontinued     100 mg 78 mL/hr over 100 Minutes Intravenous Daily at bedtime 06/27/17 2324 06/28/17 1413   06/28/17 0600  levofloxacin (LEVAQUIN) IVPB 500 mg  Status:  Discontinued     500 mg 100 mL/hr over 60 Minutes Intravenous Every 24 hours 06/28/17 0006 06/28/17 1413   06/28/17 0100  anidulafungin (ERAXIS) 200 mg in sodium chloride 0.9 % 200 mL IVPB     200 mg 78 mL/hr over 200 Minutes Intravenous  Once 06/28/17 0015 06/28/17 0549   06/27/17 2359  metroNIDAZOLE (FLAGYL) IVPB 500 mg  Status:  Discontinued     500 mg 100 mL/hr over 60 Minutes Intravenous Every 8 hours 06/27/17 2324  06/29/17 1918   06/27/17 2359  levofloxacin (LEVAQUIN) IVPB 500 mg  Status:  Discontinued     500 mg 100 mL/hr over 60 Minutes Intravenous Daily at bedtime 06/27/17 2324 06/28/17 0006   06/27/17 2030  vancomycin (VANCOCIN) IVPB 1000 mg/200 mL premix     1,000 mg 200 mL/hr over 60 Minutes Intravenous Every 12 hours 06/27/17 1741 06/28/17 1110   06/27/17 1345  cefUROXime (ZINACEF) 1.5 g in dextrose 5 % 50 mL IVPB     1.5 g 100 mL/hr over 30 Minutes Intravenous To Surgery 06/27/17 1340 06/27/17 1341      Medications: Scheduled Meds: . amLODipine  5 mg Oral Daily  . cefpodoxime  200 mg Oral Q12H  . docusate sodium  100 mg Oral Daily  . feeding supplement (ENSURE ENLIVE)  237 mL Oral BID BM  . fluconazole  400 mg Oral Daily  . gabapentin  300 mg Oral BID  . iron polysaccharides  150 mg Oral Daily  . metroNIDAZOLE  500 mg Oral Q8H  . multivitamin with minerals  1 tablet Oral Daily  . pantoprazole  40 mg Oral Daily  .  simvastatin  20 mg Oral q1800   Continuous Infusions: . sodium chloride    . sodium chloride    . heparin 950 Units/hr (07/07/17 1133)  . lactated ringers 10 mL/hr at 07/05/17 1005   PRN Meds:.sodium chloride, acetaminophen **OR** acetaminophen, alum & mag hydroxide-simeth, guaiFENesin-dextromethorphan, morphine injection, ondansetron, oxyCODONE-acetaminophen, phenol    Objective: Weight change:   Intake/Output Summary (Last 24 hours) at 07/07/17 1901 Last data filed at 07/07/17 1855  Gross per 24 hour  Intake           360.83 ml  Output             1550 ml  Net         -1189.17 ml   Blood pressure 120/65, pulse 86, temperature 98.1 F (36.7 C), temperature source Oral, resp. rate 12, height 5\' 2"  (1.575 m), weight 153 lb (69.4 kg), SpO2 98 %. Temp:  [97.8 F (36.6 C)-98.4 F (36.9 C)] 98.1 F (36.7 C) (10/12 1158) Pulse Rate:  [68-104] 86 (10/12 1600) Resp:  [12-18] 12 (10/12 1600) BP: (108-140)/(62-69) 120/65 (10/12 1600) SpO2:  [95 %-100 %] 98 %  (10/12 1400)  Physical Exam: General: Alert and awake, oriented x3,  HEENT: anicteric sclera, pupils reactive to light and accommodation, EOMI CVS regular rate, normal r,   Chest:  no wheezing, resp distress Abdomen: soft midline bandage  Sp AKA  Neuro: nonfocal  CBC:    BMET  Recent Labs  07/06/17 0808 07/07/17 0905  NA 136 138  K 4.1 3.5  CL 101 105  CO2 27 24  GLUCOSE 111* 84  BUN 7 7  CREATININE 0.68 0.72  CALCIUM 7.6* 7.7*     Liver Panel  No results for input(s): PROT, ALBUMIN, AST, ALT, ALKPHOS, BILITOT, BILIDIR, IBILI in the last 72 hours.     Sedimentation Rate No results for input(s): ESRSEDRATE in the last 72 hours. C-Reactive Protein No results for input(s): CRP in the last 72 hours.  Micro Results: Recent Results (from the past 720 hour(s))  Culture, blood (Routine X 2) w Reflex to ID Panel     Status: Abnormal   Collection Time: 06/28/17 11:00 AM  Result Value Ref Range Status   Specimen Description BLOOD LEFT ARM  Final   Special Requests IN PEDIATRIC BOTTLE Blood Culture adequate volume  Final   Culture  Setup Time   Final    GRAM POSITIVE COCCI IN PEDIATRIC BOTTLE CRITICAL RESULT CALLED TO, READ BACK BY AND VERIFIED WITH: N. Batchelder Pharm.D. 9:55 06/29/17 ([email or web address])    Culture (A)  Final    STAPHYLOCOCCUS SPECIES (COAGULASE NEGATIVE) THE SIGNIFICANCE OF ISOLATING THIS ORGANISM FROM A SINGLE SET OF BLOOD CULTURES WHEN MULTIPLE SETS ARE DRAWN IS UNCERTAIN. PLEASE NOTIFY THE MICROBIOLOGY DEPARTMENT WITHIN ONE WEEK IF SPECIATION AND SENSITIVITIES ARE REQUIRED.    Report Status 07/01/2017 FINAL  Final  Blood Culture ID Panel (Reflexed)     Status: Abnormal   Collection Time: 06/28/17 11:00 AM  Result Value Ref Range Status   Enterococcus species NOT DETECTED NOT DETECTED Final   Listeria monocytogenes NOT DETECTED NOT DETECTED Final   Staphylococcus species DETECTED (A) NOT DETECTED Final    Comment: Methicillin (oxacillin) resistant  coagulase negative staphylococcus. Possible blood culture contaminant (unless isolated from more than one blood culture draw or clinical case suggests pathogenicity). No antibiotic treatment is indicated for blood  culture contaminants. CRITICAL RESULT CALLED TO, READ BACK BY AND VERIFIED WITH: N. Batchelder Pharm.D. 9:55 06/29/17 ([email or web address])  Staphylococcus aureus NOT DETECTED NOT DETECTED Final   Methicillin resistance DETECTED (A) NOT DETECTED Final    Comment: CRITICAL RESULT CALLED TO, READ BACK BY AND VERIFIED WITH: N. Batchelder Pharm.D. 9:55 06/29/17 ([email or web address])    Streptococcus species NOT DETECTED NOT DETECTED Final   Streptococcus agalactiae NOT DETECTED NOT DETECTED Final   Streptococcus pneumoniae NOT DETECTED NOT DETECTED Final   Streptococcus pyogenes NOT DETECTED NOT DETECTED Final   Acinetobacter baumannii NOT DETECTED NOT DETECTED Final   Enterobacteriaceae species NOT DETECTED NOT DETECTED Final   Enterobacter cloacae complex NOT DETECTED NOT DETECTED Final   Escherichia coli NOT DETECTED NOT DETECTED Final   Klebsiella oxytoca NOT DETECTED NOT DETECTED Final   Klebsiella pneumoniae NOT DETECTED NOT DETECTED Final   Proteus species NOT DETECTED NOT DETECTED Final   Serratia marcescens NOT DETECTED NOT DETECTED Final   Haemophilus influenzae NOT DETECTED NOT DETECTED Final   Neisseria meningitidis NOT DETECTED NOT DETECTED Final   Pseudomonas aeruginosa NOT DETECTED NOT DETECTED Final   Candida albicans NOT DETECTED NOT DETECTED Final   Candida glabrata NOT DETECTED NOT DETECTED Final   Candida krusei NOT DETECTED NOT DETECTED Final   Candida parapsilosis NOT DETECTED NOT DETECTED Final   Candida tropicalis NOT DETECTED NOT DETECTED Final  Culture, blood (Routine X 2) w Reflex to ID Panel     Status: None   Collection Time: 06/28/17 11:09 AM  Result Value Ref Range Status   Specimen Description BLOOD LEFT HAND  Final   Special Requests IN PEDIATRIC BOTTLE Blood Culture  adequate volume  Final   Culture NO GROWTH 5 DAYS  Final   Report Status 07/03/2017 FINAL  Final  Body fluid culture     Status: None   Collection Time: 07/01/17  2:27 PM  Result Value Ref Range Status   Specimen Description FLUID PELVIS RIGHT  Final   Special Requests Normal  Final   Gram Stain   Final    FEW WBC PRESENT, PREDOMINANTLY MONONUCLEAR NO ORGANISMS SEEN    Culture NO GROWTH 3 DAYS  Final   Report Status 07/04/2017 FINAL  Final  MRSA PCR Screening     Status: None   Collection Time: 07/05/17  5:58 AM  Result Value Ref Range Status   MRSA by PCR NEGATIVE NEGATIVE Final    Comment:        The GeneXpert MRSA Assay (FDA approved for NASAL specimens only), is one component of a comprehensive MRSA colonization surveillance program. It is not intended to diagnose MRSA infection nor to guide or monitor treatment for MRSA infections.     Studies/Results: No results found.    Assessment/Plan:  INTERVAL HISTORY:   As of time of this note being written fundoscopic exam performed and I believe no endophthalmitis found Principal Problem:   Critical lower limb ischemia Active Problems:   PAD (peripheral artery disease) (HCC)   Stroke (HCC)   SBO (small bowel obstruction) (HCC)   Intraabdominal fluid collection absess   Candidemia ([place])   Abdominal pain   History of CVA with residual deficit   Tobacco abuse   Acute blood loss anemia   Post-operative pain   Leukocytosis   Ischemic neuropathy of left foot   DNR (do not resuscitate) discussion   Palliative care by specialist   Pressure injury of skin   Pelvic abscess in female [facility])   Pelvic abscess in female    Anna Hayes is a 66 y.o. female with  Hx of SBO,  perforation sp ex lap complicated by abscess, fungemia with C tropicalis, limb ischemia sp embolectomy and angioplasty  #1 Nonviable foot on the left:  Thankfully sp curative AKA  #2 Abdominal abscess: she is allergic to PCN so I will change  to cefpodoxime and flagyl to cover bacterial pathogens in abdomen and continue fluconazole  I would continue antibacterial and antifungal therapy until abscess is resolved or if not yet resolved on imaging then for at least a month, I would drop the fluconazole to 200mg  once she finishes her 2 week course  #3 Fungemia with C tropicalis: pt needs MINIMUM of 14 days of fluconazole  Dr. Jalene Mullet has seen pt and my understanding is that there is no fungal endophthalmitis  Again as above she needs 14 days of high dose azole therapy (through October 16th)  then drop the fluconazole to 200mg  (it can be oral given high bio-availability) and continue this with her antibacterial therap until abscess is resolved or she has had at least a month of therapy  I did temporarily DC her statin due to drug drug interaction with azole would check with pharmacy since the azole can elevate level of statin, perhaps she can S be without the statin while she finishes her therapy   I will sign off.          LOS: 10 days   Alcide Evener 07/07/2017, 7:01 PM

## 2017-07-07 NOTE — Progress Notes (Signed)
ANTICOAGULATION CONSULT NOTE - Follow Up Consult  Pharmacy Consult for Heparin Indication: LE ischemia  Patient Measurements: Height: 5\' 2"  (157.5 cm) Weight: 153 lb (69.4 kg) IBW/kg (Calculated) : 50.1 Heparin Dosing Weight: 69 kg  Labs:  Recent Labs  07/05/17 0226 07/05/17 1903  07/06/17 0808 07/06/17 1536 07/07/17 0905 07/07/17 1839  HGB 7.8* 8.2*  --  7.6*  --  8.0*  --   HCT 24.7* 26.0*  --  24.1*  --  25.4*  --   PLT 317  --   --  345  --  394  --   LABPROT 16.2*  --   --   --   --   --   --   INR 1.31  --   --   --   --   --   --   HEPARINUNFRC 0.54  --   < >  --  0.28* 0.65 0.62  CREATININE 0.75  --   --  0.68  --  0.72  --   < > = values in this interval not displayed.  Estimated Creatinine Clearance: 61.4 mL/min (by C-G formula based on SCr of 0.72 mg/dL).  Assessment:  66yof s/p ex lap with lysis of adhesions and small bowel perforation repair on 9/24, admitted with ischemic left limb s/p bilateral femoral popliteal embolectomy and left leg fasciotomy on 10/2, BKA 10/10.   Heparin level tonight above desired goal at 0.62 after rate adjustment this morning. No issues noted, will decrease rate further and follow up with am labs.   Goal of Therapy:  Heparin level 0.3-0.5 units/ml Monitor platelets by anticoagulation protocol: Yes   Plan:  Decrease heparin drip to 850 units/hr. Heparin level in am  Erin Hearing PharmD., [facility] Clinical Pharmacist Pager [phone] 07/07/2017 8:23 PM

## 2017-07-07 NOTE — Progress Notes (Signed)
I met with pt and her daughter at bedside to discuss goals an expectations of an inpt rehab admit. They prefer inpt rehab rather than SNF. I await OT eval so that I can begin insurance authorization for a possible admit early next week. I have placed OT order. I will follow up on Monday. [phone]

## 2017-07-07 NOTE — Consult Note (Signed)
[facility] Nurse wound consult note  Vascular team following for assessment and plan of care to left leg wound and vac dressing. Reason for Consult: Vac dressing change to abd wound Wound type: Full thickness post-op wound with sutures visible to inner wound bed Wound bed: Appearance unchanged since previous assessment.  90% red and moist, 10% yellow, decreasing amt of undermining to wound edges Drainage (amount, consistency, odor) modamt pinkdrainage in the cannister, no odor Periwound: Intact skin surrounding Dressing procedure/placement/frequency: Applied Mepitel contact layer, then one piece white foam, then one piece black foam. Pt medicated for pain prior to the procedure and tolerated with mod amt discomfort. WOC will plan to change abd Vac dressing Q M/W/F. Discussed plan of care and pt verbalized understanding. Julien Girt MSN, RN, [place], [place], [place]

## 2017-07-07 NOTE — Consult Note (Addendum)
Anna Hayes                                                                               07/07/2017                                              Ophthalmology Consultation                                         Consult requested by: Dr.  Rhina Brackett Dam  Reason for consultation:  Fungemia and risk of endophthalmitis  HPI:  66 y.o female with fungemia (C. Tropicalis), abdominal abscess, and recent AKA for nonviable left root  Pertinent Medical History:   Active Ambulatory Problems    Diagnosis Date Noted  . No Active Ambulatory Problems   Resolved Ambulatory Problems    Diagnosis Date Noted  . No Resolved Ambulatory Problems   Past Medical History:  Diagnosis Date  . Frequent headaches   . Hypertension   . Muscle pain   . Palpitations   . Sinus problem   . Stroke [facility])       Pertinent Ophthalmic History: None  Current Eye Medications:  none  Systemic medications on admission:   Medications Prior to Admission  Medication Sig Dispense Refill  . amLODipine (NORVASC) 5 MG tablet Take 5 mg by mouth daily.     Marland Kitchen atenolol (TENORMIN) 50 MG tablet Take 25 mg by mouth daily.     . betamethasone dipropionate (DIPROLENE) 0.05 % cream     . butalbital-acetaminophen-caffeine (FIORICET, ESGIC) 50-325-40 MG per tablet Take 2 tablets by mouth every 6 (six) hours as needed for headache or migraine.     . dicyclomine (BENTYL) 10 MG capsule Take 10 mg by mouth 3 (three) times daily before meals.     . furosemide (LASIX) 20 MG tablet Take 20 mg by mouth daily.     Marland Kitchen gabapentin (NEURONTIN) 300 MG capsule Take 300 mg by mouth 3 (three) times daily.     Marland Kitchen levothyroxine (SYNTHROID, LEVOTHROID) 25 MCG tablet Take 25 mcg by mouth daily.     Marland Kitchen lisinopril (PRINIVIL,ZESTRIL) 10 MG tablet Take 10 mg by mouth daily.     Marland Kitchen omeprazole (PRILOSEC) 40 MG capsule Take 40 mg by mouth daily.     . simvastatin (ZOCOR) 40 MG tablet Take 40 mg by mouth daily.           ROS:  Eyes - patient unawaare  of how long vision has been decreased. All others [place]  Visual Fields: FTC OS       Pupils:  Pharmacologically dilated at my direction before exam   Equal, brisk, no APD      Near acuity:   [place] CF @4ft      [place]  20/60  OS     TA:        Normal to palpation OU       Dilation:  both eyes  Medication used  Cyclogyl     External:   OD:  Normal       OS:  Normal      Anterior segment exam:  By penlight   By slit lap      Conjunctiva:  OD:  Quiet      OS:  Quiet     Cornea:    OD: Clear, no fluorescein stain      OS: clear  AC: OD: no obvious cell   OS:  Deep/quiet     Iris:    OD:  Normal       OS:  Normal      Lens:    OD:  2-3+ NS        OS:  2-3+ NS        Optic disc:  OD:  overlying gliosis   OS:  Flat, sharp, pink, healthy    0.2  Central retina  OD: hyperemia of disc, tortuous vessels, macular edema   OS:  2 subretinal lesions with elevation and whitish deposit    Peripheral retina-  OD:  Limited due to vitreous opacities - no obvious trears/breaks, no snoballs  OS:  Normal to ora 360 degrees      Impression:   Fungemis with endophthalmitis OD>OS.  OD appears to have resolving fungemia with contraction of vitreous strands, disk hyperemia and vascular congestion..  Patient denies any recent change in vision.  Left eye with subretinal fungal infection in the temporal macula.  Recommendations/Plan:  Continue IV antifungal agents.  Given her degree of involvement, she may require intravitreal antifungal injections and possible vitrectomy with injection of antifungals in the right eye.  The left eye should respond to IV antifungals and reoslve the subretinal deposits.  She may suffer irreversible vision loss in the right eye associated with endophtalmitis/fungemai.    I discussed witth the patient and her daughter that there may be some vision loss in the right eye.  I've discussed these findings with the nurse and/or resident. Please contact our office with  any questions or concerns at [phone] Thank you for calling us to care for this patient .  Royston Cowper

## 2017-07-08 ENCOUNTER — Inpatient Hospital Stay (HOSPITAL_COMMUNITY): Payer: Medicare Other

## 2017-07-08 LAB — HEPARIN LEVEL (UNFRACTIONATED)
HEPARIN UNFRACTIONATED: 0.58 [IU]/mL (ref 0.30–0.70)
HEPARIN UNFRACTIONATED: 0.31 [IU]/mL (ref 0.30–0.70)
Heparin Unfractionated: 0.28 IU/mL — ABNORMAL LOW (ref 0.30–0.70)

## 2017-07-08 LAB — BASIC METABOLIC PANEL
ANION GAP: 9 (ref 5–15)
BUN: 8 mg/dL (ref 6–20)
CALCIUM: 7.9 mg/dL — AB (ref 8.9–10.3)
CO2: 28 mmol/L (ref 22–32)
Chloride: 100 mmol/L — ABNORMAL LOW (ref 101–111)
Creatinine, Ser: 0.73 mg/dL (ref 0.44–1.00)
Glucose, Bld: 89 mg/dL (ref 65–99)
Potassium: 3.7 mmol/L (ref 3.5–5.1)
Sodium: 137 mmol/L (ref 135–145)

## 2017-07-08 LAB — CBC
HEMATOCRIT: 26.8 % — AB (ref 36.0–46.0)
Hemoglobin: 8.2 g/dL — ABNORMAL LOW (ref 12.0–15.0)
MCH: 24.1 pg — ABNORMAL LOW (ref 26.0–34.0)
MCHC: 30.6 g/dL (ref 30.0–36.0)
MCV: 78.8 fL (ref 78.0–100.0)
PLATELETS: 404 10*3/uL — AB (ref 150–400)
RBC: 3.4 MIL/uL — ABNORMAL LOW (ref 3.87–5.11)
RDW: 20.1 % — AB (ref 11.5–15.5)
WBC: 12.8 10*3/uL — AB (ref 4.0–10.5)

## 2017-07-08 MED ORDER — SODIUM CHLORIDE 0.9 % IV SOLN
100.0000 mg | INTRAVENOUS | Status: DC
  Administered 2017-07-09: 100 mg via INTRAVENOUS
  Filled 2017-07-08: qty 100

## 2017-07-08 MED ORDER — SODIUM CHLORIDE 0.9 % IV SOLN
200.0000 mg | Freq: Once | INTRAVENOUS | Status: AC
  Administered 2017-07-08: 200 mg via INTRAVENOUS
  Filled 2017-07-08: qty 200

## 2017-07-08 MED ORDER — GADOBENATE DIMEGLUMINE 529 MG/ML IV SOLN
15.0000 mL | Freq: Once | INTRAVENOUS | Status: AC | PRN
  Administered 2017-07-08: 15 mL via INTRAVENOUS

## 2017-07-08 NOTE — Progress Notes (Signed)
ANTICOAGULATION CONSULT NOTE - Follow Up Consult  Pharmacy Consult for Heparin Indication: LE ischemia  Patient Measurements: Height: 5\' 2"  (157.5 cm) Weight: 151 lb 1.6 oz (68.5 kg) IBW/kg (Calculated) : 50.1 Heparin Dosing Weight: 69 kg  Labs:  Recent Labs  07/06/17 0808  07/07/17 0905 07/07/17 1839 07/08/17 0328 07/08/17 1204  HGB 7.6*  --  8.0*  --  8.2*  --   HCT 24.1*  --  25.4*  --  26.8*  --   PLT 345  --  394  --  404*  --   HEPARINUNFRC  --   < > 0.65 0.62 0.58 0.31  CREATININE 0.68  --  0.72  --  0.73  --   < > = values in this interval not displayed.  Estimated Creatinine Clearance: 61.1 mL/min (by C-G formula based on SCr of 0.73 mg/dL).  Assessment:  66yof s/p ex lap with lysis of adhesions and small bowel perforation repair on 9/24, admitted with ischemic left limb s/p bilateral femoral popliteal embolectomy and left leg fasciotomy on 10/2, BKA 10/10.   Heparin level now therapeutic at 0.31 for lower goal after rate decrease. CBC stable, no bleed documented.   Goal of Therapy:  Heparin level 0.3-0.5 units/ml Monitor platelets by anticoagulation protocol: Yes   Plan:  -Continue heparin drip at 750 units/hr. -6h heparin level to confirm -Daily heparin level and CBC -Monitor for s/sx bleeding   Elicia Lamp, PharmD, BCPS Clinical Pharmacist Rx Phone # for today: [phone] After 3:30PM, please call Main Rx: #[id number] 07/08/2017 1:24 PM

## 2017-07-08 NOTE — Progress Notes (Signed)
      INFECTIOUS DISEASE ATTENDING ADDENDUM:   Date: 07/08/2017  Patient name: Anna Hayes  Medical record number: [id number]  Date of birth: Feb 02, 1951    I had an extensive conversation with Dr. Posey Pronto and when I wrote in my note that she had normal exam I had been speaking prematurely  He rang me this am and let me know (and his finalized note indicates this) that pt IN FACT DOES HAVE FUNGAL ENDOPTHALMITIS OD>OS  I agree with getting an MRI. I agree with INTRAVITREAL INJECTIONS as even with systemic antifungal therapy it is very dfificult to achieve therapeutic levels in the eye  I am changing back to ERAXIS for now.  WE will also pursue TEE on Monday  She continue on oral abx for her abdominal infection    [place] 07/08/2017, 10:54 AM

## 2017-07-08 NOTE — Progress Notes (Signed)
PROGRESS NOTE  Anna Hayes JAS:[id number] DOB: [birth year] DOA: 06/27/2017 PCP: Rochel Brome, MD  HPI/Recap of past 57 hours: 66 year old woman presented with abdominal pain and was admitted to [facility] 9/22. Found to have small bowel obstruction. Underwent expiratory laparotomy with lysis of adhesions and release of small bowel obstruction, repair of small bowel perforation. Subsequently had fevers, leukocytosis and CT abdomen and pelvis 9/29 revealed pelvic abscess and underwent successful placement of percutaneous drain October 1. Also noted to have fungemia on 1/2 blood culture 9/27 and started on antifungal therapy. She subsequently developed left lower stomach pain and discomfort October 1 and underwent CT angiogram which revealed bilateral arterial blockage both lower extremities, was started on heparin and emergently transferred to [facility] where she underwent thrombectomy and fasciotomy October 2.  Since then, vascular surgery has felt that her left foot is nonviable and left BKA has been recommended, but patient is having difficulty making that decision. Palliative medicine was consulted and patient met with them and family today for goals of care discussion.  After this meeting, patient has opted for surgery.  Subjective;  Denies worsening abdominal pain   Assessment/Plan: Critical left leg ischemia, status post bilateral femoral embolectomy, angioplasty left posterior tibial artery, fasciotomy left leg 10/2. -VVS managing. Left foot nonviable. Patient now amenable to left BKA. Underwent surgery on 10/10 -repeat cbc tonight. -Heparin per vascular. Will follow vascular recommendation for oral anticoagulation.   Fungemia with Endophthalmitis; OD--OS. Left Eye with subretinal fungal infection in the temporal macula; -spoke with Dr Posey Pronto. He recommended MRI brain and orbit with and without contrast.  -he will discussed with ID, regarding resuming IV antifungal.  -He is  planning Intra-ocular antifungal injection 10-14. -now on eraxis.  - Status post laparotomy, lysis of adhesions and repair of perforated bowel 9/24 at [facility]. -per general surgery. Wound VAC.  Abdominal abscess, status post percutaneous drain placement October 1.  -WBC slowly trending down. Continue abx as per ID  CT done 10/8 notes persistent fluid collections.   Surgery recommend to continue with wound vac until follow up with outpatient surgeon.  -Treatment recommendation  Per ID; Needs 14 days of Azole therapy (through Oct 16) then drop the fluconazole to 200 mg ( it can be oral ) continue with antibacterial therapy until abscess is resolved or she has had at least a month of therapy.  Antibiotics change to cefopoxime and flagyl.   Fungemia Candida tropicalis  -now with endophthalmitis.  -On IV Eraxis  -needs TEE. Will arrange with cardiology.  repeated blood culture 10-03 negative.    Hypoalbuminemia.  -diet per surgery .  -supplements per dietician   anemia, likely multifactorial including perioperative blood loss. S/p 2 units PRBC 10/2. -Hgb stable.   Start nu iron   Obesity unspecified: meets criteria BMI greater than 30  Status post AKI: Now resolved  PMH stroke with left upper and left lower extremity weakness, left facial droop  Code Status: Full code   Family Communication: patient.   Disposition Plan: CIR consulted. Needs clearance from vascular and Surgery    Consultants: Vascular surgery General surgery Infectious disease Interventional radiology Physical medicine and rehabilitation   Procedures:  9/24: Exploratory laparoscopy with lysis of adhesions and release of small bowel obstruction, repair of small bowel perforation  10/2: Transfused 2 units packed red blood cells  10/1: Percutaneous drain placement and right buttock for pelvic abscess  10/2: Bilateral femoral popliteal embolectomy, left posterior tibial artery patch angioplasty and  left  lower leg fasciotomy   10/10: Planned amputation  Antimicrobials: Anidulafungin 10/2 >>10/3 Fluconazole 10/3 >> 10/17 Levaquin 10/2 >>ceftriaxone   Metronidazole 10/2 >>   DVT prophylaxis:  Heparin   Objective: Vitals:   07/08/17 0200 07/08/17 0300 07/08/17 0400 07/08/17 0820  BP: 121/65 133/66 129/68 126/64  Pulse: 93 96 99   Resp: '13 13 13   ' Temp:   98.3 F (36.8 C)   TempSrc:   Oral   SpO2: 92% 96% 90%   Weight:   68.5 kg (151 lb 1.6 oz)   Height:        Intake/Output Summary (Last 24 hours) at 07/08/17 1002 Last data filed at 07/08/17 0627  Gross per 24 hour  Intake           367.46 ml  Output             1200 ml  Net          -832.54 ml   Filed Weights   07/01/17 0526 07/06/17 0300 07/08/17 0400  Weight: 79.4 kg (175 lb) 69.4 kg (153 lb) 68.5 kg (151 lb 1.6 oz)    Exam:   General: NAD  HEENT; MMM  Neck: Supple, no JVD  Cardiovascular: S 1, S 2 RRR  Respiratory: CTA  Abdomen: Soft, nt, nd  Musculoskeletal; No cyanosis.   Skin: Left AKA,      Data Reviewed: CBC:  Recent Labs Lab 07/04/17 0029 07/05/17 0226 07/05/17 1903 07/06/17 0808 07/07/17 0905 07/08/17 0328  WBC 15.3* 14.5*  --  18.8* 16.4* 12.8*  HGB 7.9* 7.8* 8.2* 7.6* 8.0* 8.2*  HCT 24.3* 24.7* 26.0* 24.1* 25.4* 26.8*  MCV 77.1* 77.7*  --  77.7* 78.9 78.8  PLT 301 317  --  345 394 503*   Basic Metabolic Panel:  Recent Labs Lab 07/04/17 0029 07/04/17 1102 07/05/17 0226 07/06/17 0808 07/07/17 0905 07/08/17 0328  NA 136  --  137 136 138 137  K 3.0*  --  4.1 4.1 3.5 3.7  CL 100*  --  103 101 105 100*  CO2 29  --  '26 27 24 28  ' GLUCOSE 103*  --  107* 111* 84 89  BUN <5*  --  5* '7 7 8  ' CREATININE 0.88  --  0.75 0.68 0.72 0.73  CALCIUM 7.2*  --  7.4* 7.6* 7.7* 7.9*  MG  --  1.7  --   --   --   --    GFR: Estimated Creatinine Clearance: 61.1 mL/min (by C-G formula based on SCr of 0.73 mg/dL). Liver Function Tests: No results for input(s): AST, ALT, ALKPHOS,  BILITOT, PROT, ALBUMIN in the last 168 hours. No results for input(s): LIPASE, AMYLASE in the last 168 hours. No results for input(s): AMMONIA in the last 168 hours. Coagulation Profile:  Recent Labs Lab 07/05/17 0226  INR 1.31   Cardiac Enzymes:  Recent Labs Lab 07/03/17 1152  CKTOTAL 3,206*   BNP (last 3 results) No results for input(s): PROBNP in the last 8760 hours. HbA1C: No results for input(s): HGBA1C in the last 72 hours. CBG: No results for input(s): GLUCAP in the last 168 hours. Lipid Profile: No results for input(s): CHOL, HDL, LDLCALC, TRIG, CHOLHDL, LDLDIRECT in the last 72 hours. Thyroid Function Tests: No results for input(s): TSH, T4TOTAL, FREET4, T3FREE, THYROIDAB in the last 72 hours. Anemia Panel: No results for input(s): VITAMINB12, FOLATE, FERRITIN, TIBC, IRON, RETICCTPCT in the last 72 hours. Urine analysis: No results found for: COLORURINE,  APPEARANCEUR, LABSPEC, PHURINE, GLUCOSEU, HGBUR, BILIRUBINUR, KETONESUR, PROTEINUR, UROBILINOGEN, NITRITE, LEUKOCYTESUR Sepsis Labs: '@LABRCNTIP' (procalcitonin:4,lacticidven:4)  ) Recent Results (from the past 240 hour(s))  Culture, blood (Routine X 2) w Reflex to ID Panel     Status: Abnormal   Collection Time: 06/28/17 11:00 AM  Result Value Ref Range Status   Specimen Description BLOOD LEFT ARM  Final   Special Requests IN PEDIATRIC BOTTLE Blood Culture adequate volume  Final   Culture  Setup Time   Final    GRAM POSITIVE COCCI IN PEDIATRIC BOTTLE CRITICAL RESULT CALLED TO, READ BACK BY AND VERIFIED WITH: N. Batchelder Pharm.D. 9:55 06/29/17 ([email or web address])    Culture (A)  Final    STAPHYLOCOCCUS SPECIES (COAGULASE NEGATIVE) THE SIGNIFICANCE OF ISOLATING THIS ORGANISM FROM A SINGLE SET OF BLOOD CULTURES WHEN MULTIPLE SETS ARE DRAWN IS UNCERTAIN. PLEASE NOTIFY THE MICROBIOLOGY DEPARTMENT WITHIN ONE WEEK IF SPECIATION AND SENSITIVITIES ARE REQUIRED.    Report Status 07/01/2017 FINAL  Final  Blood Culture ID Panel  (Reflexed)     Status: Abnormal   Collection Time: 06/28/17 11:00 AM  Result Value Ref Range Status   Enterococcus species NOT DETECTED NOT DETECTED Final   Listeria monocytogenes NOT DETECTED NOT DETECTED Final   Staphylococcus species DETECTED (A) NOT DETECTED Final    Comment: Methicillin (oxacillin) resistant coagulase negative staphylococcus. Possible blood culture contaminant (unless isolated from more than one blood culture draw or clinical case suggests pathogenicity). No antibiotic treatment is indicated for blood  culture contaminants. CRITICAL RESULT CALLED TO, READ BACK BY AND VERIFIED WITH: N. Batchelder Pharm.D. 9:55 06/29/17 ([email or web address])    Staphylococcus aureus NOT DETECTED NOT DETECTED Final   Methicillin resistance DETECTED (A) NOT DETECTED Final    Comment: CRITICAL RESULT CALLED TO, READ BACK BY AND VERIFIED WITH: N. Batchelder Pharm.D. 9:55 06/29/17 ([email or web address])    Streptococcus species NOT DETECTED NOT DETECTED Final   Streptococcus agalactiae NOT DETECTED NOT DETECTED Final   Streptococcus pneumoniae NOT DETECTED NOT DETECTED Final   Streptococcus pyogenes NOT DETECTED NOT DETECTED Final   Acinetobacter baumannii NOT DETECTED NOT DETECTED Final   Enterobacteriaceae species NOT DETECTED NOT DETECTED Final   Enterobacter cloacae complex NOT DETECTED NOT DETECTED Final   Escherichia coli NOT DETECTED NOT DETECTED Final   Klebsiella oxytoca NOT DETECTED NOT DETECTED Final   Klebsiella pneumoniae NOT DETECTED NOT DETECTED Final   Proteus species NOT DETECTED NOT DETECTED Final   Serratia marcescens NOT DETECTED NOT DETECTED Final   Haemophilus influenzae NOT DETECTED NOT DETECTED Final   Neisseria meningitidis NOT DETECTED NOT DETECTED Final   Pseudomonas aeruginosa NOT DETECTED NOT DETECTED Final   Candida albicans NOT DETECTED NOT DETECTED Final   Candida glabrata NOT DETECTED NOT DETECTED Final   Candida krusei NOT DETECTED NOT DETECTED Final   Candida parapsilosis NOT  DETECTED NOT DETECTED Final   Candida tropicalis NOT DETECTED NOT DETECTED Final  Culture, blood (Routine X 2) w Reflex to ID Panel     Status: None   Collection Time: 06/28/17 11:09 AM  Result Value Ref Range Status   Specimen Description BLOOD LEFT HAND  Final   Special Requests IN PEDIATRIC BOTTLE Blood Culture adequate volume  Final   Culture NO GROWTH 5 DAYS  Final   Report Status 07/03/2017 FINAL  Final  Body fluid culture     Status: None   Collection Time: 07/01/17  2:27 PM  Result Value Ref Range Status   Specimen Description FLUID PELVIS RIGHT  Final   Special  Requests Normal  Final   Gram Stain   Final    FEW WBC PRESENT, PREDOMINANTLY MONONUCLEAR NO ORGANISMS SEEN    Culture NO GROWTH 3 DAYS  Final   Report Status 07/04/2017 FINAL  Final  MRSA PCR Screening     Status: None   Collection Time: 07/05/17  5:58 AM  Result Value Ref Range Status   MRSA by PCR NEGATIVE NEGATIVE Final    Comment:        The GeneXpert MRSA Assay (FDA approved for NASAL specimens only), is one component of a comprehensive MRSA colonization surveillance program. It is not intended to diagnose MRSA infection nor to guide or monitor treatment for MRSA infections.       Studies: No results found.  Scheduled Meds: . amLODipine  5 mg Oral Daily  . cefpodoxime  200 mg Oral Q12H  . docusate sodium  100 mg Oral Daily  . feeding supplement (ENSURE ENLIVE)  237 mL Oral BID BM  . gabapentin  300 mg Oral BID  . iron polysaccharides  150 mg Oral Daily  . metroNIDAZOLE  500 mg Oral Q8H  . multivitamin with minerals  1 tablet Oral Daily  . pantoprazole  40 mg Oral Daily    Continuous Infusions: . sodium chloride    . sodium chloride    . [START ON 07/09/2017] anidulafungin    . anidulafungin    . heparin 750 Units/hr (07/08/17 0627)  . lactated ringers 10 mL/hr at 07/05/17 1005     LOS: 11 days     Elmarie Shiley, MD [facility] Pager [phone]  If 7PM-7AM,  please contact night-coverage [email or web address] Password [email or web address] 07/08/2017, 10:02 AM

## 2017-07-08 NOTE — Progress Notes (Signed)
  Progress Note    07/08/2017 1:33 PM 3 Days Post-Op  Subjective:  No new complaints  Vitals:   07/08/17 0400 07/08/17 0820  BP: 129/68 126/64  Pulse: 99   Resp: 13   Temp: 98.3 F (36.8 C)   SpO2: 90%     Physical Exam: aaox3 Abdomen with vac in place R dp palpable Left aka site with incisional vac  CBC    Component Value Date/Time   WBC 12.8 (H) 07/08/2017 0328   RBC 3.40 (L) 07/08/2017 0328   HGB 8.2 (L) 07/08/2017 0328   HCT 26.8 (L) 07/08/2017 0328   PLT 404 (H) 07/08/2017 0328   MCV 78.8 07/08/2017 0328   MCH 24.1 (L) 07/08/2017 0328   MCHC 30.6 07/08/2017 0328   RDW 20.1 (H) 07/08/2017 0328    BMET    Component Value Date/Time   NA 137 07/08/2017 0328   K 3.7 07/08/2017 0328   CL 100 (L) 07/08/2017 0328   CO2 28 07/08/2017 0328   GLUCOSE 89 07/08/2017 0328   BUN 8 07/08/2017 0328   CREATININE 0.73 07/08/2017 0328   CALCIUM 7.9 (L) 07/08/2017 0328   GFRNONAA >60 07/08/2017 0328   GFRAA >60 07/08/2017 0328    INR    Component Value Date/Time   INR 1.31 07/05/2017 0226     Intake/Output Summary (Last 24 hours) at 07/08/17 1333 Last data filed at 07/08/17 1100  Gross per 24 hour  Intake           367.46 ml  Output             1700 ml  Net         -1332.54 ml     Assessment:  66 y.o. female is s/p rle revascularization with palpable dp pulse and now left aka with incisional vac  Plan: Will remove left aka vac prior to discharge On heparin drip  Brandon C. Donzetta Matters, MD [facility] Office: [phone] Pager: [phone]  07/08/2017 1:33 PM

## 2017-07-08 NOTE — Progress Notes (Signed)
ANTICOAGULATION CONSULT NOTE - Follow Up Consult  Pharmacy Consult for Heparin Indication: LE ischemia  Patient Measurements: Height: 5\' 2"  (157.5 cm) Weight: 151 lb 1.6 oz (68.5 kg) IBW/kg (Calculated) : 50.1 Heparin Dosing Weight: 69 kg  Labs:  Recent Labs  07/06/17 0808  07/07/17 0905  07/08/17 0328 07/08/17 1204 07/08/17 1805  HGB 7.6*  --  8.0*  --  8.2*  --   --   HCT 24.1*  --  25.4*  --  26.8*  --   --   PLT 345  --  394  --  404*  --   --   HEPARINUNFRC  --   < > 0.65  < > 0.58 0.31 0.28*  CREATININE 0.68  --  0.72  --  0.73  --   --   < > = values in this interval not displayed.  Estimated Creatinine Clearance: 61.1 mL/min (by C-G formula based on SCr of 0.73 mg/dL).  Assessment:  46 YOF s/p ex lap with lysis of adhesions and small bowel perforation repair on 9/24, admitted with ischemic left limb s/p bilateral femoral popliteal embolectomy and left leg fasciotomy on 10/2, BKA 10/10.   Heparin level is slightly SUBtherapeutic this evening (HL 0.28 << 0.31, goal of 0.3-0.5). CBC low but stable. No bleeding noted at this time.   Goal of Therapy:  Heparin level 0.3-0.5 units/ml Monitor platelets by anticoagulation protocol: Yes   Plan:  1. Increase Heparin drip rate slightly to 800 units/hr (8 ml/hr) 2. Will continue to monitor for any signs/symptoms of bleeding and will follow up with heparin level in the AM  Thank you for allowing pharmacy to be a part of this patient's care.  Alycia Rossetti, PharmD, BCPS Clinical Pharmacist Pager: [phone] If after 3:30p, please call main pharmacy at: [phone] 07/08/2017 6:57 PM

## 2017-07-08 NOTE — Progress Notes (Signed)
ANTICOAGULATION CONSULT NOTE  Pharmacy Consult for Heparin Indication: LE ischemia  Patient Measurements: Height: 5\' 2"  (157.5 cm) Weight: 153 lb (69.4 kg) IBW/kg (Calculated) : 50.1 Heparin Dosing Weight: 69 kg  Labs:  Recent Labs  07/06/17 0808  07/07/17 0905 07/07/17 1839 07/08/17 0328  HGB 7.6*  --  8.0*  --  8.2*  HCT 24.1*  --  25.4*  --  26.8*  PLT 345  --  394  --  404*  HEPARINUNFRC  --   < > 0.65 0.62 0.58  CREATININE 0.68  --  0.72  --   --   < > = values in this interval not displayed.  Estimated Creatinine Clearance: 61.4 mL/min (by C-G formula based on SCr of 0.72 mg/dL).  Assessment:  66 yo female with PAD s/p bilateral femoral popliteal embolectomy and left leg fasciotomy on 10/2 for heparin  Goal of Therapy:  Heparin level 0.3-0.5 units/ml Monitor platelets by anticoagulation protocol: Yes   Plan:  Decrease heparin drip to 750 units/hr.  Phillis Knack, PharmD, BCPS  07/08/2017 4:14 AM

## 2017-07-09 DIAGNOSIS — B49 Unspecified mycosis: Secondary | ICD-10-CM

## 2017-07-09 DIAGNOSIS — H4419 Other endophthalmitis: Secondary | ICD-10-CM

## 2017-07-09 LAB — CBC
HEMATOCRIT: 25.7 % — AB (ref 36.0–46.0)
HEMOGLOBIN: 7.9 g/dL — AB (ref 12.0–15.0)
MCH: 24.2 pg — ABNORMAL LOW (ref 26.0–34.0)
MCHC: 30.7 g/dL (ref 30.0–36.0)
MCV: 78.8 fL (ref 78.0–100.0)
Platelets: 426 10*3/uL — ABNORMAL HIGH (ref 150–400)
RBC: 3.26 MIL/uL — AB (ref 3.87–5.11)
RDW: 20 % — AB (ref 11.5–15.5)
WBC: 11.4 10*3/uL — AB (ref 4.0–10.5)

## 2017-07-09 LAB — HEPARIN LEVEL (UNFRACTIONATED)
HEPARIN UNFRACTIONATED: 0.45 [IU]/mL (ref 0.30–0.70)
HEPARIN UNFRACTIONATED: 0.43 [IU]/mL (ref 0.30–0.70)
Heparin Unfractionated: 0.2 IU/mL — ABNORMAL LOW (ref 0.30–0.70)

## 2017-07-09 MED ORDER — LIDOCAINE HCL 2 % IJ SOLN
INTRAMUSCULAR | Status: AC
  Filled 2017-07-09: qty 20

## 2017-07-09 MED ORDER — TETRACAINE HCL 0.5 % OP SOLN
OPHTHALMIC | Status: AC
  Filled 2017-07-09: qty 4

## 2017-07-09 MED ORDER — FLUCONAZOLE IN SODIUM CHLORIDE 400-0.9 MG/200ML-% IV SOLN
800.0000 mg | INTRAVENOUS | Status: DC
  Administered 2017-07-09 – 2017-07-12 (×4): 800 mg via INTRAVENOUS
  Filled 2017-07-09 (×4): qty 400

## 2017-07-09 MED ORDER — IOPAMIDOL (ISOVUE-300) INJECTION 61%
INTRAVENOUS | Status: AC
  Administered 2017-07-09: 23:00:00
  Filled 2017-07-09: qty 100

## 2017-07-09 MED ORDER — VORICONAZOLE INTRAOCULAR INJECTION 100 MCG/ 0.1 ML KALEIDOSCOPE
500.0000 ug | INTRAVITREAL | Status: AC
  Filled 2017-07-09: qty 1

## 2017-07-09 MED ORDER — OFLOXACIN 0.3 % OP SOLN
1.0000 [drp] | Freq: Four times a day (QID) | OPHTHALMIC | Status: DC
  Administered 2017-07-09 – 2017-07-11 (×7): 1 [drp] via OPHTHALMIC
  Filled 2017-07-09: qty 5

## 2017-07-09 MED ORDER — IOPAMIDOL (ISOVUE-300) INJECTION 61%
INTRAVENOUS | Status: AC
  Administered 2017-07-09: 22:00:00
  Filled 2017-07-09: qty 30

## 2017-07-09 MED ORDER — TETRACAINE HCL 1 % IJ SOLN
INTRAMUSCULAR | Status: AC
  Filled 2017-07-09: qty 2

## 2017-07-09 NOTE — Progress Notes (Signed)
  Progress Note    07/09/2017 12:27 PM 4 Days Post-Op  Subjective:  No issues this a.m.  Vitals:   07/09/17 0300 07/09/17 0503  BP: 133/76   Pulse: 90 93  Resp: 16 19  Temp: 98.6 F (37 C)   SpO2: 100% 100%    Physical Exam: Palpable right dp Incisional vac on left aka is cdi  CBC    Component Value Date/Time   WBC 11.4 (H) 07/09/2017 0230   RBC 3.26 (L) 07/09/2017 0230   HGB 7.9 (L) 07/09/2017 0230   HCT 25.7 (L) 07/09/2017 0230   PLT 426 (H) 07/09/2017 0230   MCV 78.8 07/09/2017 0230   MCH 24.2 (L) 07/09/2017 0230   MCHC 30.7 07/09/2017 0230   RDW 20.0 (H) 07/09/2017 0230    BMET    Component Value Date/Time   NA 137 07/08/2017 0328   K 3.7 07/08/2017 0328   CL 100 (L) 07/08/2017 0328   CO2 28 07/08/2017 0328   GLUCOSE 89 07/08/2017 0328   BUN 8 07/08/2017 0328   CREATININE 0.73 07/08/2017 0328   CALCIUM 7.9 (L) 07/08/2017 0328   GFRNONAA >60 07/08/2017 0328   GFRAA >60 07/08/2017 0328    INR    Component Value Date/Time   INR 1.31 07/05/2017 0226     Intake/Output Summary (Last 24 hours) at 07/09/17 1227 Last data filed at 07/09/17 0603  Gross per 24 hour  Intake              322 ml  Output             1875 ml  Net            -1553 ml     Assessment:  66 y.o. female is s/p rle revascularization with palpable dp pulse and now left aka with incisional vac  Plan: Will remove left aka vac prior to discharge On heparin drip, will need long term AC as outpatient but with plan for eye intervention today   Zenas Santa C. Donzetta Matters, MD [facility] [place] Office: [phone] Pager: [phone]  07/09/2017 12:27 PM

## 2017-07-09 NOTE — Progress Notes (Signed)
07/09/2017 1620 Pt was unable to sign name on consent for eye procedure because she could not see well enough.  Procedure and risks explained to pt by Dr. Posey Pronto.  Pt consents to procedure-was able to place an X on line for signature.   Anna Hayes

## 2017-07-09 NOTE — Progress Notes (Signed)
Subjective: No new complaints   Antibiotics:  Anti-infectives    Start     Dose/Rate Route Frequency Ordered Stop   07/09/17 1000  anidulafungin (ERAXIS) 100 mg in sodium chloride 0.9 % 100 mL IVPB     100 mg 78 mL/hr over 100 Minutes Intravenous Every 24 hours 07/08/17 0905     07/08/17 0915  anidulafungin (ERAXIS) 200 mg in sodium chloride 0.9 % 200 mL IVPB     200 mg 78 mL/hr over 200 Minutes Intravenous Once 07/08/17 0905 07/08/17 1428   07/07/17 0600  cefpodoxime (VANTIN) tablet 200 mg     200 mg Oral Every 12 hours 07/06/17 1409     07/06/17 1415  fluconazole (DIFLUCAN) tablet 400 mg  Status:  Discontinued     400 mg Oral Daily 07/06/17 1410 07/08/17 0905   07/06/17 0600  cefTRIAXone (ROCEPHIN) 2 g in dextrose 5 % 50 mL IVPB  Status:  Discontinued     2 g 100 mL/hr over 30 Minutes Intravenous Every 24 hours 07/05/17 1502 07/06/17 1409   07/05/17 1515  metroNIDAZOLE (FLAGYL) tablet 500 mg     500 mg Oral Every 8 hours 07/05/17 1502     06/29/17 2300  fluconazole (DIFLUCAN) IVPB 400 mg  Status:  Discontinued     400 mg 100 mL/hr over 120 Minutes Intravenous Every 24 hours 06/28/17 1413 07/06/17 1410   06/29/17 2200  metroNIDAZOLE (FLAGYL) tablet 500 mg  Status:  Discontinued     500 mg Oral Every 8 hours 06/29/17 1918 07/05/17 1427   06/29/17 0600  cefTRIAXone (ROCEPHIN) 2 g in dextrose 5 % 50 mL IVPB  Status:  Discontinued     2 g 100 mL/hr over 30 Minutes Intravenous Every 24 hours 06/28/17 1413 07/05/17 1427   06/28/17 2300  fluconazole (DIFLUCAN) IVPB 800 mg     800 mg 200 mL/hr over 120 Minutes Intravenous  Once 06/28/17 1413 06/29/17 0148   06/28/17 2200  anidulafungin (ERAXIS) 100 mg in sodium chloride 0.9 % 100 mL IVPB  Status:  Discontinued     100 mg 78 mL/hr over 100 Minutes Intravenous Daily at bedtime 06/27/17 2324 06/28/17 1413   06/28/17 0600  levofloxacin (LEVAQUIN) IVPB 500 mg  Status:  Discontinued     500 mg 100 mL/hr over 60 Minutes  Intravenous Every 24 hours 06/28/17 0006 06/28/17 1413   06/28/17 0100  anidulafungin (ERAXIS) 200 mg in sodium chloride 0.9 % 200 mL IVPB     200 mg 78 mL/hr over 200 Minutes Intravenous  Once 06/28/17 0015 06/28/17 0549   06/27/17 2359  metroNIDAZOLE (FLAGYL) IVPB 500 mg  Status:  Discontinued     500 mg 100 mL/hr over 60 Minutes Intravenous Every 8 hours 06/27/17 2324 06/29/17 1918   06/27/17 2359  levofloxacin (LEVAQUIN) IVPB 500 mg  Status:  Discontinued     500 mg 100 mL/hr over 60 Minutes Intravenous Daily at bedtime 06/27/17 2324 06/28/17 0006   06/27/17 2030  vancomycin (VANCOCIN) IVPB 1000 mg/200 mL premix     1,000 mg 200 mL/hr over 60 Minutes Intravenous Every 12 hours 06/27/17 1741 06/28/17 1110   06/27/17 1345  cefUROXime (ZINACEF) 1.5 g in dextrose 5 % 50 mL IVPB     1.5 g 100 mL/hr over 30 Minutes Intravenous To Surgery 06/27/17 1340 06/27/17 1341      Medications: Scheduled Meds: . amLODipine  5 mg Oral Daily  . cefpodoxime  200 mg  Oral Q12H  . docusate sodium  100 mg Oral Daily  . feeding supplement (ENSURE ENLIVE)  237 mL Oral BID BM  . gabapentin  300 mg Oral BID  . iron polysaccharides  150 mg Oral Daily  . metroNIDAZOLE  500 mg Oral Q8H  . multivitamin with minerals  1 tablet Oral Daily  . pantoprazole  40 mg Oral Daily   Continuous Infusions: . sodium chloride    . sodium chloride    . anidulafungin 100 mg (07/09/17 1135)  . heparin 900 Units/hr (07/09/17 1143)  . lactated ringers 10 mL/hr at 07/05/17 1005   PRN Meds:.sodium chloride, acetaminophen **OR** acetaminophen, alum & mag hydroxide-simeth, guaiFENesin-dextromethorphan, morphine injection, ondansetron, oxyCODONE-acetaminophen, phenol    Objective: Weight change: -1 lb 6.4 oz (-0.635 kg)  Intake/Output Summary (Last 24 hours) at 07/09/17 1310 Last data filed at 07/09/17 0603  Gross per 24 hour  Intake              322 ml  Output             1875 ml  Net            -1553 ml   Blood  pressure 133/76, pulse 93, temperature 98.6 F (37 C), temperature source Oral, resp. rate 19, height 5\' 2"  (1.575 m), weight 149 lb 11.2 oz (67.9 kg), SpO2 100 %. Temp:  [98.6 F (37 C)-99.4 F (37.4 C)] 98.6 F (37 C) (10/14 0300) Pulse Rate:  [90-104] 93 (10/14 0503) Resp:  [13-19] 19 (10/14 0503) BP: (120-144)/(70-76) 133/76 (10/14 0300) SpO2:  [97 %-100 %] 100 % (10/14 0503) Weight:  [149 lb 11.2 oz (67.9 kg)] 149 lb 11.2 oz (67.9 kg) (10/14 0503)  Physical Exam: General: Alert and awake, oriented x3,  HEENT: anicteric sclera,  EOMI CV[facility] regular rate, normal r,   Chest:  no wheezing, resp distress Abdomen: soft midline bandage  Sp AKA  Neuro: nonfocal  CBC:    BMET  Recent Labs  07/07/17 0905 07/08/17 0328  NA 138 137  K 3.5 3.7  CL 105 100*  CO2 24 28  GLUCOSE 84 89  BUN 7 8  CREATININE 0.72 0.73  CALCIUM 7.7* 7.9*     Liver Panel  No results for input(s): PROT, ALBUMIN, AST, ALT, ALKPHOS, BILITOT, BILIDIR, IBILI in the last 72 hours.     Sedimentation Rate No results for input(s): ESRSEDRATE in the last 72 hours. C-Reactive Protein No results for input(s): CRP in the last 72 hours.  Micro Results: Recent Results (from the past 720 hour(s))  Culture, blood (Routine X 2) w Reflex to ID Panel     Status: Abnormal   Collection Time: 06/28/17 11:00 AM  Result Value Ref Range Status   Specimen Description BLOOD LEFT ARM  Final   Special Requests IN PEDIATRIC BOTTLE Blood Culture adequate volume  Final   Culture  Setup Time   Final    GRAM POSITIVE COCCI IN PEDIATRIC BOTTLE CRITICAL RESULT CALLED TO, READ BACK BY AND VERIFIED WITH: N. Batchelder Pharm.D. 9:55 06/29/17 ([email or web address])    Culture (A)  Final    STAPHYLOCOCCUS SPECIES (COAGULASE NEGATIVE) THE SIGNIFICANCE OF ISOLATING THIS ORGANISM FROM A SINGLE SET OF BLOOD CULTURES WHEN MULTIPLE SETS ARE DRAWN IS UNCERTAIN. PLEASE NOTIFY THE MICROBIOLOGY DEPARTMENT WITHIN ONE WEEK IF SPECIATION AND  SENSITIVITIES ARE REQUIRED.    Report Status 07/01/2017 FINAL  Final  Blood Culture ID Panel (Reflexed)     Status: Abnormal   Collection Time:  06/28/17 11:00 AM  Result Value Ref Range Status   Enterococcus species NOT DETECTED NOT DETECTED Final   Listeria monocytogenes NOT DETECTED NOT DETECTED Final   Staphylococcus species DETECTED (A) NOT DETECTED Final    Comment: Methicillin (oxacillin) resistant coagulase negative staphylococcus. Possible blood culture contaminant (unless isolated from more than one blood culture draw or clinical case suggests pathogenicity). No antibiotic treatment is indicated for blood  culture contaminants. CRITICAL RESULT CALLED TO, READ BACK BY AND VERIFIED WITH: N. Batchelder Pharm.D. 9:55 06/29/17 ([email or web address])    Staphylococcus aureus NOT DETECTED NOT DETECTED Final   Methicillin resistance DETECTED (A) NOT DETECTED Final    Comment: CRITICAL RESULT CALLED TO, READ BACK BY AND VERIFIED WITH: N. Batchelder Pharm.D. 9:55 06/29/17 ([email or web address])    Streptococcus species NOT DETECTED NOT DETECTED Final   Streptococcus agalactiae NOT DETECTED NOT DETECTED Final   Streptococcus pneumoniae NOT DETECTED NOT DETECTED Final   Streptococcus pyogenes NOT DETECTED NOT DETECTED Final   Acinetobacter baumannii NOT DETECTED NOT DETECTED Final   Enterobacteriaceae species NOT DETECTED NOT DETECTED Final   Enterobacter cloacae complex NOT DETECTED NOT DETECTED Final   Escherichia coli NOT DETECTED NOT DETECTED Final   Klebsiella oxytoca NOT DETECTED NOT DETECTED Final   Klebsiella pneumoniae NOT DETECTED NOT DETECTED Final   Proteus species NOT DETECTED NOT DETECTED Final   Serratia marcescens NOT DETECTED NOT DETECTED Final   Haemophilus influenzae NOT DETECTED NOT DETECTED Final   Neisseria meningitidis NOT DETECTED NOT DETECTED Final   Pseudomonas aeruginosa NOT DETECTED NOT DETECTED Final   Candida albicans NOT DETECTED NOT DETECTED Final   Candida glabrata NOT DETECTED  NOT DETECTED Final   Candida krusei NOT DETECTED NOT DETECTED Final   Candida parapsilosis NOT DETECTED NOT DETECTED Final   Candida tropicalis NOT DETECTED NOT DETECTED Final  Culture, blood (Routine X 2) w Reflex to ID Panel     Status: None   Collection Time: 06/28/17 11:09 AM  Result Value Ref Range Status   Specimen Description BLOOD LEFT HAND  Final   Special Requests IN PEDIATRIC BOTTLE Blood Culture adequate volume  Final   Culture NO GROWTH 5 DAYS  Final   Report Status 07/03/2017 FINAL  Final  Body fluid culture     Status: None   Collection Time: 07/01/17  2:27 PM  Result Value Ref Range Status   Specimen Description FLUID PELVIS RIGHT  Final   Special Requests Normal  Final   Gram Stain   Final    FEW WBC PRESENT, PREDOMINANTLY MONONUCLEAR NO ORGANISMS SEEN    Culture NO GROWTH 3 DAYS  Final   Report Status 07/04/2017 FINAL  Final  MRSA PCR Screening     Status: None   Collection Time: 07/05/17  5:58 AM  Result Value Ref Range Status   MRSA by PCR NEGATIVE NEGATIVE Final    Comment:        The GeneXpert MRSA Assay (FDA approved for NASAL specimens only), is one component of a comprehensive MRSA colonization surveillance program. It is not intended to diagnose MRSA infection nor to guide or monitor treatment for MRSA infections.     Studies/Results: Mr Jeri Cos OI Contrast  Result Date: 07/08/2017 CLINICAL DATA:  Functional diarrhea. Fungemia. 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 EXAM: MRI HEAD AND ORBITS WITHOUT AND WITH CONTRAST TECHNIQUE: Multiplanar, multiecho pulse sequences of the brain and surrounding structures were obtained without and with intravenous contrast. Multiplanar, multiecho pulse sequences of the orbits and surrounding structures were obtained  including fat saturation techniques, before and after intravenous contrast administration. CONTRAST:  21mL MULTIHANCE GADOBENATE DIMEGLUMINE 529 MG/ML IV SOLN COMPARISON:  Head CT 02/24/2011 FINDINGS: MRI  HEAD FINDINGS Brain: Large area of encephalomalacia affecting the right insula posterior frontal lobe extending from cortex to the ependyma. Mild involvement of the superior right temporal lobe. Findings consistent with remote MCA branch infarct. There is mild associated hemosiderin staining. Wallerian atrophy seen into the right brainstem. No evidence of intracranial infection. No infarct, evidence of sulcal debris, hydrocephalus, or masslike area. No abnormal intracranial enhancement. Vascular: Major flow voids are preserved. Dural venous sinuses have symmetric unremarkable flow. Skull and upper cervical spine: Negative for marrow lesion. MRI ORBITS FINDINGS Orbits: History of endophthalmitis. No abnormal or asymmetric vitreous signal. The globes have a normal shape. No asymmetric or suspected scleral thickening or avid enhancement. On coronal postcontrast imaging there is mildly prominent enhancement at the level of the optic sheaths, but this enhancement appears less prominent and more discontinuous on coronal postcontrast imaging, doubt true dural thickening. No abnormal signal within the optic nerves themselves. No orbital fat inflammation. Normal appearance of the extraocular muscles and lacrimal glands. Visualized sinuses: Clear Soft tissues: Negative IMPRESSION: 1. History of endophthalmitis which is not apparent by imaging. No noted scleral or periscleral inflammation. 2. No evidence of intracranial infection. 3. Remote right MCA branch infarct. Electronically Signed   By: Monte Fantasia M.D.   On: 07/08/2017 10:23   Mr Rosealee Albee NA Contrast  Result Date: 07/08/2017 CLINICAL DATA:  Functional diarrhea. Fungemia. 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 EXAM: MRI HEAD AND ORBITS WITHOUT AND WITH CONTRAST TECHNIQUE: Multiplanar, multiecho pulse sequences of the brain and surrounding structures were obtained without and with intravenous contrast. Multiplanar, multiecho pulse sequences of the orbits and  surrounding structures were obtained including fat saturation techniques, before and after intravenous contrast administration. CONTRAST:  17mL MULTIHANCE GADOBENATE DIMEGLUMINE 529 MG/ML IV SOLN COMPARISON:  Head CT 02/24/2011 FINDINGS: MRI HEAD FINDINGS Brain: Large area of encephalomalacia affecting the right insula posterior frontal lobe extending from cortex to the ependyma. Mild involvement of the superior right temporal lobe. Findings consistent with remote MCA branch infarct. There is mild associated hemosiderin staining. Wallerian atrophy seen into the right brainstem. No evidence of intracranial infection. No infarct, evidence of sulcal debris, hydrocephalus, or masslike area. No abnormal intracranial enhancement. Vascular: Major flow voids are preserved. Dural venous sinuses have symmetric unremarkable flow. Skull and upper cervical spine: Negative for marrow lesion. MRI ORBITS FINDINGS Orbits: History of endophthalmitis. No abnormal or asymmetric vitreous signal. The globes have a normal shape. No asymmetric or suspected scleral thickening or avid enhancement. On coronal postcontrast imaging there is mildly prominent enhancement at the level of the optic sheaths, but this enhancement appears less prominent and more discontinuous on coronal postcontrast imaging, doubt true dural thickening. No abnormal signal within the optic nerves themselves. No orbital fat inflammation. Normal appearance of the extraocular muscles and lacrimal glands. Visualized sinuses: Clear Soft tissues: Negative IMPRESSION: 1. History of endophthalmitis which is not apparent by imaging. No noted scleral or periscleral inflammation. 2. No evidence of intracranial infection. 3. Remote right MCA branch infarct. Electronically Signed   By: Monte Fantasia M.D.   On: 07/08/2017 10:23      Assessment/Plan:  INTERVAL HISTORY:   Pt diagnosed with fungal endophthalmitis    Principal Problem:   Critical lower limb  ischemia Active Problems:   PAD (peripheral artery disease) ([place])   Stroke ([facility])   SBO (  small bowel obstruction) ([facility])   Intraabdominal fluid collection absess   Candidemia ([facility])   Abdominal pain   History of CVA with residual deficit   Tobacco abuse   Acute blood loss anemia   Post-operative pain   Leukocytosis   Ischemic neuropathy of left foot   DNR (do not resuscitate) discussion   Palliative care by specialist   Pressure injury of skin   Pelvic abscess in female [facility])   Pelvic abscess in female    Anna Hayes is a 66 y.o. female with  Hx of SBO, perforation sp ex lap complicated by abscess, fungemia with C tropicalis, limb ischemia sp embolectomy and angioplasty  #1 3 Fungemia with C tropicalis and Candidal endophthalmitis. She does not have evidence of CNS involvement.  I would NOW pursue TEE  SHE WILL LIKELY NEED INTRAVITREAL ANTIFUNGALS SUCH AS AMPHO or VORICONAZOLE  I will actually SWITCH HER BACK TO IV FLUCONAZOLE since its penetration of the EYE is superior than echinocandin  #2 Abdominal abscess: she is allergic to PCN so I will change to cefpodoxime and flagyl to cover bacterial pathogens in abdomen and continue fluconazole  I would continue antibacterial and antifungal therapy until abscess is resolved or if not yet resolved on imaging then for at least a month, I would drop the fluconazole to 200mg  once she finishes her 2 week course  #3  Ischemic foot sp amputation     LOS: 12 days   Alcide Evener 07/09/2017, 1:10 PM

## 2017-07-09 NOTE — Progress Notes (Addendum)
ANTICOAGULATION CONSULT NOTE - Follow Up Consult  Pharmacy Consult for Heparin Indication: LE ischemia  Patient Measurements: Height: 5\' 2"  (157.5 cm) Weight: 149 lb 11.2 oz (67.9 kg) IBW/kg (Calculated) : 50.1 Heparin Dosing Weight: 69 kg  Labs:  Recent Labs  07/07/17 0905  07/08/17 0328  07/08/17 1805 07/09/17 0230 07/09/17 1100  HGB 8.0*  --  8.2*  --   --  7.9*  --   HCT 25.4*  --  26.8*  --   --  25.7*  --   PLT 394  --  404*  --   --  426*  --   HEPARINUNFRC 0.65  < > 0.58  < > 0.28* 0.20* 0.45  CREATININE 0.72  --  0.73  --   --   --   --   < > = values in this interval not displayed.  Estimated Creatinine Clearance: 60.8 mL/min (by C-G formula based on SCr of 0.73 mg/dL).  Assessment:  66yof s/p ex lap with lysis of adhesions and small bowel perforation repair on 9/24, admitted with ischemic left limb s/p bilateral femoral popliteal embolectomy and left leg fasciotomy on 10/2, BKA 10/10.   Heparin level now therapeutic at 0.45 for lower goal after rate increase. CBC stable, no bleed documented.   Goal of Therapy:  Heparin level 0.3-0.5 units/ml Monitor platelets by anticoagulation protocol: Yes   Plan:  -Continue heparin drip at 900 units/hr. -6h heparin level to confirm -Daily heparin level and CBC -Monitor for s/sx bleeding   Elicia Lamp, PharmD, BCPS Clinical Pharmacist Rx Phone # for today: [phone] After 3:30PM, please call Main Rx: [phone] 07/09/2017 1:46 PM   ADDENDUM:  Confirmatory heparin level remains therapeutic at 0.43. No bleed documented.  Plan: Continue heparin drip at 900 units/hr Daily heparin level/CBC Monitor for s/sx bleeding  Elicia Lamp, PharmD, BCPS Clinical Pharmacist 07/09/2017 7:51 PM

## 2017-07-09 NOTE — Progress Notes (Signed)
Patient ID: Anna Hayes, female   DOB: Oct 12, 1950, 66 y.o.   MRN: [id number]    Referring Physician(s): Dr. Rolm Bookbinder  Supervising Physician: Aletta Edouard  Patient Status: [name] - In-pt  Chief Complaint: Pelvic abscess  Subjective: Denies complaints related to TG drain  Allergies: Latex; Codeine; and Penicillins  Medications: Prior to Admission medications   Medication Sig Start Date End Date Taking? Authorizing Provider  amLODipine (NORVASC) 5 MG tablet Take 5 mg by mouth daily.  09/11/13  Yes [provider]  atenolol (TENORMIN) 50 MG tablet Take 25 mg by mouth daily.  08/24/13  Yes [provider]  betamethasone dipropionate (DIPROLENE) 0.05 % cream  04/27/14  Yes [provider]  butalbital-acetaminophen-caffeine (FIORICET, ESGIC) 50-325-40 MG per tablet Take 2 tablets by mouth every 6 (six) hours as needed for headache or migraine.  09/27/13  Yes [provider]  dicyclomine (BENTYL) 10 MG capsule Take 10 mg by mouth 3 (three) times daily before meals.  07/26/13  Yes [provider]  furosemide (LASIX) 20 MG tablet Take 20 mg by mouth daily.  12/17/13  Yes [provider]  gabapentin (NEURONTIN) 300 MG capsule Take 300 mg by mouth 3 (three) times daily.  08/26/13  Yes [provider]  levothyroxine (SYNTHROID, LEVOTHROID) 25 MCG tablet Take 25 mcg by mouth daily.  09/25/13  Yes [provider]  lisinopril (PRINIVIL,ZESTRIL) 10 MG tablet Take 10 mg by mouth daily.  07/22/13  Yes [provider]  omeprazole (PRILOSEC) 40 MG capsule Take 40 mg by mouth daily.  07/26/13  Yes [provider]  simvastatin (ZOCOR) 40 MG tablet Take 40 mg by mouth daily.  07/04/13  Yes [provider]    Vital Signs: BP 133/76 (BP Location: Right Arm)   Pulse 93   Temp 98.6 F (37 C) (Oral)   Resp 19   Ht 5\' 2"  (1.575 m)   Wt 149 lb 11.2 oz (67.9 kg)   SpO2 100%   BMI 27.38 kg/m    Physical Exam: Abd: right TG drain intact with small amount of serosanguinous output.  Drain site is c/d/i  Imaging: Mr Jeri Cos Wo Contrast  Result Date: 07/08/2017 CLINICAL DATA:  Functional diarrhea. Fungemia. 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 EXAM: MRI HEAD AND ORBITS WITHOUT AND WITH CONTRAST TECHNIQUE: Multiplanar, multiecho pulse sequences of the brain and surrounding structures were obtained without and with intravenous contrast. Multiplanar, multiecho pulse sequences of the orbits and surrounding structures were obtained including fat saturation techniques, before and after intravenous contrast administration. CONTRAST:  20mL MULTIHANCE GADOBENATE DIMEGLUMINE 529 MG/ML IV SOLN COMPARISON:  Head CT 02/24/2011 FINDINGS: MRI HEAD FINDINGS Brain: Large area of encephalomalacia affecting the right insula posterior frontal lobe extending from cortex to the ependyma. Mild involvement of the superior right temporal lobe. Findings consistent with remote MCA branch infarct. There is mild associated hemosiderin staining. Wallerian atrophy seen into the right brainstem. No evidence of intracranial infection. No infarct, evidence of sulcal debris, hydrocephalus, or masslike area. No abnormal intracranial enhancement. Vascular: Major flow voids are preserved. Dural venous sinuses have symmetric unremarkable flow. Skull and upper cervical spine: Negative for marrow lesion. MRI ORBITS FINDINGS Orbits: History of endophthalmitis. No abnormal or asymmetric vitreous signal. The globes have a normal shape. No asymmetric or suspected scleral thickening or avid enhancement. On coronal postcontrast imaging there is mildly prominent enhancement at the level of the optic sheaths, but this enhancement appears less prominent and more discontinuous on coronal postcontrast  imaging, doubt true dural thickening. No abnormal signal within the optic nerves themselves. No orbital fat inflammation. Normal appearance of the  extraocular muscles and lacrimal glands. Visualized sinuses: Clear Soft tissues: Negative IMPRESSION: 1. History of endophthalmitis which is not apparent by imaging. No noted scleral or periscleral inflammation. 2. No evidence of intracranial infection. 3. Remote right MCA branch infarct. Electronically Signed   By: Monte Fantasia M.D.   On: 07/08/2017 10:23   [name] Contrast  Result Date: 07/08/2017 CLINICAL DATA:  Functional diarrhea. Fungemia. 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 EXAM: MRI HEAD AND ORBITS WITHOUT AND WITH CONTRAST TECHNIQUE: Multiplanar, multiecho pulse sequences of the brain and surrounding structures were obtained without and with intravenous contrast. Multiplanar, multiecho pulse sequences of the orbits and surrounding structures were obtained including fat saturation techniques, before and after intravenous contrast administration. CONTRAST:  72mL MULTIHANCE GADOBENATE DIMEGLUMINE 529 MG/ML IV SOLN COMPARISON:  Head CT 02/24/2011 FINDINGS: MRI HEAD FINDINGS Brain: Large area of encephalomalacia affecting the right insula posterior frontal lobe extending from cortex to the ependyma. Mild involvement of the superior right temporal lobe. Findings consistent with remote MCA branch infarct. There is mild associated hemosiderin staining. Wallerian atrophy seen into the right brainstem. No evidence of intracranial infection. No infarct, evidence of sulcal debris, hydrocephalus, or masslike area. No abnormal intracranial enhancement. Vascular: Major flow voids are preserved. Dural venous sinuses have symmetric unremarkable flow. Skull and upper cervical spine: Negative for marrow lesion. MRI ORBITS FINDINGS Orbits: History of endophthalmitis. No abnormal or asymmetric vitreous signal. The globes have a normal shape. No asymmetric or suspected scleral thickening or avid enhancement. On coronal postcontrast imaging there is mildly prominent enhancement at the level of the optic sheaths,  but this enhancement appears less prominent and more discontinuous on coronal postcontrast imaging, doubt true dural thickening. No abnormal signal within the optic nerves themselves. No orbital fat inflammation. Normal appearance of the extraocular muscles and lacrimal glands. Visualized sinuses: Clear Soft tissues: Negative IMPRESSION: 1. History of endophthalmitis which is not apparent by imaging. No noted scleral or periscleral inflammation. 2. No evidence of intracranial infection. 3. Remote right MCA branch infarct. Electronically Signed   By: Monte Fantasia M.D.   On: 07/08/2017 10:23    Labs:  CBC:  Recent Labs  07/06/17 0808 07/07/17 0905 07/08/17 0328 07/09/17 0230  WBC 18.8* 16.4* 12.8* 11.4*  HGB 7.6* 8.0* 8.2* 7.9*  HCT 24.1* 25.4* 26.8* 25.7*  PLT 345 394 404* 426*    COAGS:  Recent Labs  06/27/17 1803 06/28/17 0752 06/29/17 1044 07/05/17 0226  INR  --   --  1.14 1.31  APTT 155* 70* 49*  --     BMP:  Recent Labs  07/05/17 0226 07/06/17 0808 07/07/17 0905 07/08/17 0328  NA 137 136 138 137  K 4.1 4.1 3.5 3.7  CL 103 101 105 100*  CO2 26 27 24 28   GLUCOSE 107* 111* 84 89  BUN 5* 7 7 8   CALCIUM 7.4* 7.6* 7.7* 7.9*  CREATININE 0.75 0.68 0.72 0.73  GFRNONAA >60 >60 >60 >60  GFRAA >60 >60 >60 >60    LIVER FUNCTION TESTS:  Recent Labs  06/27/17 1550 06/29/17 0500 07/01/17 0621  BILITOT 0.4 0.7 0.6  AST 44* 42* 32  ALT 22 20 17   ALKPHOS 47 51 47  PROT 4.3* 4.8* 5.2*  ALBUMIN 1.4* 1.7* 1.6*    Assessment and Plan: Pelvic abscess, s/p perc drain at [facility] by Dr. Stephenie Acres remains  in place with decreased output.  Discussed case with Dr. Kathlene Cote, repeat CT Abdomen/Pelvis for drain evaluation. Will follow results.  Continue routine drain care for now.   Electronically Signed: Docia Barrier 07/09/2017, 2:15 PM   I spent a total of 15 Minutes at the the patient's bedside AND on the patient's hospital floor or unit, greater  than 50% of which was counseling/coordinating care for pelvic abscess

## 2017-07-09 NOTE — Progress Notes (Signed)
ANTICOAGULATION CONSULT NOTE - Follow Up Consult  Pharmacy Consult for Heparin  Indication: LLE ischemia  Vital Signs: Temp: 98.6 F (37 C) (10/14 0300) Temp Source: Oral (10/14 0300) BP: 133/76 (10/14 0300) Pulse Rate: 90 (10/14 0300)  Labs:  Recent Labs  07/06/17 0808  07/07/17 0905  07/08/17 0328 07/08/17 1204 07/08/17 1805 07/09/17 0230  HGB 7.6*  --  8.0*  --  8.2*  --   --  7.9*  HCT 24.1*  --  25.4*  --  26.8*  --   --  25.7*  PLT 345  --  394  --  404*  --   --  426*  HEPARINUNFRC  --   < > 0.65  < > 0.58 0.31 0.28* 0.20*  CREATININE 0.68  --  0.72  --  0.73  --   --   --   < > = values in this interval not displayed.  Estimated Creatinine Clearance: 61.1 mL/min (by C-G formula based on SCr of 0.73 mg/dL).   Assessment:  66yof s/p ex lap with lysis of adhesions and small bowel perforation repair on 9/24, admitted with ischemic left limb s/p bilateral femoral popliteal embolectomy and left leg fasciotomy on 10/2, BKA 10/10.   Heparin level sub-therapeutic this AM, no issues per RN.   Goal of Therapy:  Heparin level 0.3-0.5 units/ml Monitor platelets by anticoagulation protocol: Yes   Plan:  -Inc heparin drip to 900 units/hr -1200 HL  Anna Hayes 07/09/2017,3:28 AM

## 2017-07-09 NOTE — Progress Notes (Signed)
PROGRESS NOTE  Anna Hayes HWE:[id number] DOB: Jul 01, 1951 DOA: 06/27/2017 PCP: Rochel Brome, MD  HPI/Recap of past 9 hours: 66 year old woman presented with abdominal pain and was admitted to [facility] 9/22. Found to have small bowel obstruction. Underwent expiratory laparotomy with lysis of adhesions and release of small bowel obstruction, repair of small bowel perforation. Subsequently had fevers, leukocytosis and CT abdomen and pelvis 9/29 revealed pelvic abscess and underwent successful placement of percutaneous drain October 1. Also noted to have fungemia on 1/2 blood culture 9/27 and started on antifungal therapy. She subsequently developed left lower stomach pain and discomfort October 1 and underwent CT angiogram which revealed bilateral arterial blockage both lower extremities, was started on heparin and emergently transferred to [facility] where she underwent thrombectomy and fasciotomy October 2.  Since then, vascular surgery has felt that her left foot is nonviable and left BKA has been recommended, but patient is having difficulty making that decision. Palliative medicine was consulted and patient met with them and family today for goals of care discussion.  After this meeting, patient has opted for surgery.  Subjective;  No abdominal pain.  Awaiting for procedure today   Assessment/Plan: Critical left leg ischemia, status post bilateral femoral embolectomy, angioplasty left posterior tibial artery, fasciotomy left leg 10/2. -VVS managing. Left foot nonviable. Patient now amenable to left BKA. Underwent surgery on 10/10 -repeat cbc tonight. -Heparin per vascular. Will follow vascular recommendation for oral anticoagulation.   Fungemia with Endophthalmitis; OD--OS. Left Eye with subretinal fungal infection in the temporal macula; -spoke with Dr Posey Pronto. He recommended MRI brain and orbit with and without contrast.  -he will discussed with ID, regarding resuming IV  antifungal.  -He is planning Intra-vitreal  antifungal injection 10-14. -IV fluconazole.  -Follow ID recommendation for tx.  Need TEE, might be able to be done on Tuesday   Abdominal abscess, Status post laparotomy, lysis of adhesions and repair of perforated bowel 9/24 at [facility]. -per general surgery. Wound VAC.  status post percutaneous drain placement October 1.  -WBC slowly trending down. Continue abx as per ID  CT done 10/8 notes persistent fluid collections.  Plan to repeat CT abdomen 10-15.  Surgery recommend to continue with wound vac until follow up with outpatient surgeon.  -Treatment recommendation  Per ID; Needs 14 days of Azole therapy (through Oct 16) then drop the fluconazole to 200 mg ( it can be oral ) continue with antibacterial therapy until abscess is resolved or she has had at least a month of therapy.  Antibiotics change to cefopoxime and flagyl.   Fungemia Candida tropicalis  -now with endophthalmitis.  -On IV Eraxis  -needs TEE. Will arrange with cardiology.  repeated blood culture 10-03 negative.    Hypoalbuminemia.  -diet per surgery .  -supplements per dietician   anemia, likely multifactorial including perioperative blood loss. S/p 2 units PRBC 10/2. -Hgb stable.   Start nu iron   Obesity unspecified: meets criteria BMI greater than 30  Status post AKI: Now resolved  PMH stroke with left upper and left lower extremity weakness, left facial droop  Code Status: Full code   Family Communication: patient.   Disposition Plan: CIR consulted. Needs TEE   Consultants: Vascular surgery General surgery Infectious disease Interventional radiology Physical medicine and rehabilitation   Procedures:  9/24: Exploratory laparoscopy with lysis of adhesions and release of small bowel obstruction, repair of small bowel perforation  10/2: Transfused 2 units packed red blood cells  10/1: Percutaneous drain  placement and right buttock for pelvic  abscess  10/2: Bilateral femoral popliteal embolectomy, left posterior tibial artery patch angioplasty and left lower leg fasciotomy   10/10: Planned amputation  Antimicrobials: Anidulafungin 10/2 >>10/3 Fluconazole 10/3 >> 10/17 Levaquin 10/2 >>ceftriaxone   Metronidazole 10/2 >>   DVT prophylaxis:  Heparin   Objective: Vitals:   07/08/17 1900 07/09/17 0000 07/09/17 0300 07/09/17 0503  BP: (!) 144/75 136/75 133/76   Pulse: (!) 104 94 90 93  Resp: _0 Temp: 99.4 F (37.4 C) 98.6 F (37 C) 98.6 F (37 C)   TempSrc: Oral Oral Oral   SpO2: 97% 100% 100% 100%  Weight:    67.9 kg (149 lb 11.2 oz)  Height:        Intake/Output Summary (Last 24 hours) at 07/09/17 1619 Last data filed at 07/09/17 1426  Gross per 24 hour  Intake              442 ml  Output             2625 ml  Net            -2183 ml   Filed Weights   07/06/17 0300 07/08/17 0400 07/09/17 0503  Weight: 69.4 kg (153 lb) 68.5 kg (151 lb 1.6 oz) 67.9 kg (149 lb 11.2 oz)    Exam:   General: NAD  HEENT;MMM  Neck: Supple, no JVD  Cardiovascular: S 1, S 2 RRR  Respiratory: CTA  Abdomen: Soft, NT, ND, wound vac in place.   Musculoskeletal; No cyanosis.   Skin: Left AKA,      Data Reviewed: CBC:  Recent Labs Lab 07/05/17 0226 07/05/17 1903 07/06/17 0808 07/07/17 0905 07/08/17 0328 07/09/17 0230  WBC 14.5*  --  18.8* 16.4* 12.8* 11.4*  HGB 7.8* 8.2* 7.6* 8.0* 8.2* 7.9*  HCT 24.7* 26.0* 24.1* 25.4* 26.8* 25.7*  MCV 77.7*  --  77.7* 78.9 78.8 78.8  PLT 317  --  345 394 404* 465*   Basic Metabolic Panel:  Recent Labs Lab 07/04/17 0029 07/04/17 1102 07/05/17 0226 07/06/17 0808 07/07/17 0905 07/08/17 0328  NA 136  --  137 136 138 137  K 3.0*  --  4.1 4.1 3.5 3.7  CL 100*  --  103 101 105 100*  CO2 29  --  _1 GLUCOSE 103*  --  107* 111* 84 89  BUN <5*  --  5* _2 CREATININE 0.88  --  0.75 0.68 0.72 0.73  CALCIUM 7.2*  --  7.4* 7.6* 7.7* 7.9*  MG  --   1.7  --   --   --   --    GFR: Estimated Creatinine Clearance: 60.8 mL/min (by C-G formula based on SCr of 0.73 mg/dL). Liver Function Tests: No results for input(s): AST, ALT, ALKPHOS, BILITOT, PROT, ALBUMIN in the last 168 hours. No results for input(s): LIPASE, AMYLASE in the last 168 hours. No results for input(s): AMMONIA in the last 168 hours. Coagulation Profile:  Recent Labs Lab 07/05/17 0226  INR 1.31   Cardiac Enzymes:  Recent Labs Lab 07/03/17 1152  CKTOTAL 3,206*   BNP (last 3 results) No results for input(s): PROBNP in the last 8760 hours. HbA1C: No results for input(s): HGBA1C in the last 72 hours. CBG: No results for input(s): GLUCAP in the last 168 hours. Lipid Profile: No results for input(s): CHOL, HDL, LDLCALC, TRIG, CHOLHDL, LDLDIRECT in the last 72 hours.  Thyroid Function Tests: No results for input(s): TSH, T4TOTAL, FREET4, T3FREE, THYROIDAB in the last 72 hours. Anemia Panel: No results for input(s): VITAMINB12, FOLATE, FERRITIN, TIBC, IRON, RETICCTPCT in the last 72 hours. Urine analysis: No results found for: COLORURINE, APPEARANCEUR, [place], [place], [place], [place], [place], [place], [place], [place], NITRITE, LEUKOCYTESUR Sepsis Labs: _0 (procalcitonin:4,lacticidven:4)  ) Recent Results (from the past 240 hour(s))  Body fluid culture     Status: None   Collection Time: 07/01/17  2:27 PM  Result Value Ref Range Status   Specimen Description FLUID PELVIS RIGHT  Final   Special Requests Normal  Final   Gram Stain   Final    FEW WBC PRESENT, PREDOMINANTLY MONONUCLEAR NO ORGANISMS SEEN    Culture NO GROWTH 3 DAYS  Final   Report Status 07/04/2017 FINAL  Final  MRSA PCR Screening     Status: None   Collection Time: 07/05/17  5:58 AM  Result Value Ref Range Status   MRSA by PCR NEGATIVE NEGATIVE Final    Comment:        The GeneXpert MRSA Assay (FDA approved for NASAL specimens only), is one component of  a comprehensive MRSA colonization surveillance program. It is not intended to diagnose MRSA infection nor to guide or monitor treatment for MRSA infections.   Culture, blood (Routine X 2) w Reflex to ID Panel     Status: None (Preliminary result)   Collection Time: 07/08/17 12:03 PM  Result Value Ref Range Status   Specimen Description BLOOD RIGHT HAND  Final   Special Requests   Final    BOTTLES DRAWN AEROBIC AND ANAEROBIC Blood Culture adequate volume   Culture NO GROWTH 1 DAY  Final   Report Status PENDING  Incomplete  Culture, blood (Routine X 2) w Reflex to ID Panel     Status: None (Preliminary result)   Collection Time: 07/08/17 12:04 PM  Result Value Ref Range Status   Specimen Description BLOOD LEFT HAND  Final   Special Requests   Final    BOTTLES DRAWN AEROBIC AND ANAEROBIC Blood Culture results may not be optimal due to an excessive volume of blood received in culture bottles   Culture NO GROWTH 1 DAY  Final   Report Status PENDING  Incomplete      Studies: No results found.  Scheduled Meds: . [MAR Hold] amLODipine  5 mg Oral Daily  . [MAR Hold] cefpodoxime  200 mg Oral Q12H  . [MAR Hold] docusate sodium  100 mg Oral Daily  . [MAR Hold] feeding supplement (ENSURE ENLIVE)  237 mL Oral BID BM  . [MAR Hold] gabapentin  300 mg Oral BID  . [MAR Hold] iron polysaccharides  150 mg Oral Daily  . [MAR Hold] metroNIDAZOLE  500 mg Oral Q8H  . [MAR Hold] multivitamin with minerals  1 tablet Oral Daily  . [MAR Hold] pantoprazole  40 mg Oral Daily  . [MAR Hold] voriconazole  500 mcg Intravitreal To OR    Continuous Infusions: . [MAR Hold] sodium chloride    . [MAR Hold] sodium chloride    . [MAR Hold] fluconazole (DIFLUCAN) IV 800 mg (07/09/17 1416)  . heparin 900 Units/hr (07/09/17 1143)  . lactated ringers 10 mL/hr at 07/05/17 1005     LOS: 12 days     Elmarie Shiley, MD [facility] Pager [phone]  If 7PM-7AM, please contact  night-coverage [email or web address] Password [email or web address] 07/09/2017, 4:19 PM

## 2017-07-10 ENCOUNTER — Inpatient Hospital Stay (HOSPITAL_COMMUNITY): Payer: Medicare Other

## 2017-07-10 DIAGNOSIS — K651 Peritoneal abscess: Secondary | ICD-10-CM

## 2017-07-10 DIAGNOSIS — B377 Candidal sepsis: Secondary | ICD-10-CM

## 2017-07-10 LAB — CBC
HCT: 26.3 % — ABNORMAL LOW (ref 36.0–46.0)
Hemoglobin: 8.2 g/dL — ABNORMAL LOW (ref 12.0–15.0)
MCH: 24.6 pg — ABNORMAL LOW (ref 26.0–34.0)
MCHC: 31.2 g/dL (ref 30.0–36.0)
MCV: 78.7 fL (ref 78.0–100.0)
PLATELETS: 436 10*3/uL — AB (ref 150–400)
RBC: 3.34 MIL/uL — AB (ref 3.87–5.11)
RDW: 20.4 % — AB (ref 11.5–15.5)
WBC: 10.9 10*3/uL — AB (ref 4.0–10.5)

## 2017-07-10 LAB — BASIC METABOLIC PANEL
ANION GAP: 9 (ref 5–15)
CHLORIDE: 102 mmol/L (ref 101–111)
CO2: 25 mmol/L (ref 22–32)
Calcium: 8 mg/dL — ABNORMAL LOW (ref 8.9–10.3)
Creatinine, Ser: 0.77 mg/dL (ref 0.44–1.00)
Glucose, Bld: 112 mg/dL — ABNORMAL HIGH (ref 65–99)
POTASSIUM: 3.5 mmol/L (ref 3.5–5.1)
SODIUM: 136 mmol/L (ref 135–145)

## 2017-07-10 LAB — HEPARIN LEVEL (UNFRACTIONATED): Heparin Unfractionated: 0.57 IU/mL (ref 0.30–0.70)

## 2017-07-10 MED ORDER — IOPAMIDOL (ISOVUE-300) INJECTION 61%
100.0000 mL | Freq: Once | INTRAVENOUS | Status: AC | PRN
  Administered 2017-07-10: 100 mL via INTRAVENOUS

## 2017-07-10 MED ORDER — SODIUM CHLORIDE 0.9 % IV SOLN
INTRAVENOUS | Status: DC
  Administered 2017-07-11: 06:00:00 via INTRAVENOUS

## 2017-07-10 NOTE — Progress Notes (Signed)
Anna Hayes                                                                               07/10/2017                                               Ophthalmology Follow-up                                         HPI: Pateint denies any change in vision.  She states she does not have eye pain.  Her recnet MRI brain/orbits did not show any fungal activity in the brain/orbits.  Pertinent Medical History:   Active Ambulatory Problems    Diagnosis Date Noted  . No Active Ambulatory Problems   Resolved Ambulatory Problems    Diagnosis Date Noted  . No Resolved Ambulatory Problems   Past Medical History:  Diagnosis Date  . Frequent headaches   . Hypertension   . Muscle pain   . Palpitations   . Sinus problem   . Stroke [facility])      Pertinent Ophthalmic History: vitrsous stranding/contrraction OD and subreitnal lesions OS  Current Eye Medications: none  Systemic medications on admission:   Medications Prior to Admission  Medication Sig Dispense Refill  . amLODipine (NORVASC) 5 MG tablet Take 5 mg by mouth daily.     Marland Kitchen atenolol (TENORMIN) 50 MG tablet Take 25 mg by mouth daily.     . betamethasone dipropionate (DIPROLENE) 0.05 % cream     . butalbital-acetaminophen-caffeine (FIORICET, ESGIC) 50-325-40 MG per tablet Take 2 tablets by mouth every 6 (six) hours as needed for headache or migraine.     . dicyclomine (BENTYL) 10 MG capsule Take 10 mg by mouth 3 (three) times daily before meals.     . furosemide (LASIX) 20 MG tablet Take 20 mg by mouth daily.     Marland Kitchen gabapentin (NEURONTIN) 300 MG capsule Take 300 mg by mouth 3 (three) times daily.     Marland Kitchen levothyroxine (SYNTHROID, LEVOTHROID) 25 MCG tablet Take 25 mcg by mouth daily.     Marland Kitchen lisinopril (PRINIVIL,ZESTRIL) 10 MG tablet Take 10 mg by mouth daily.     Marland Kitchen omeprazole (PRILOSEC) 40 MG capsule Take 40 mg by mouth daily.     . simvastatin (ZOCOR) 40 MG tablet Take 40 mg by mouth daily.         ROS: [place], no change from  previous  Visual Fields: FTC OS, unable OD   Near acuity:   [place]  CF OD   [place]   20/60    OS     TA:       Normal to palpation OU      Dilation:  OU with Cyclogyl  External:   OD:  Normal      OS:  Normal     Anterior segment exam:  By penlight   By slit lap    Conjunctiva:  OD:  Quiet     OS:  Quiet    Cornea:    OD: Clear, no fluorescein stain      OS: Clear, no fluorescein stain    Anterior Chamber:   OD:  Deep/quiet     OS:  Deep/quiet    [place]:    OD:  Normal      OS:  Normal     Lens:    OD:  1-2+ NS    OS:  1-2+ NS    Optic disc:  OD:  Hazy view - hyperemia?  OS:  Flat, sharp, pink, healthy   Central retina--examined with indirect ophthalmoscope:  OD:  Macula and vessels not visualized, medial hazy  OS:  Macula with remporal subreinal lesions (x2) with associated overlying pucker  Peripheral retina--examined with indirect ophthalmoscope with lid speculum and scleral depression:   OD:  Limited view, hazy  OS:  No evidence of snowballs or condensation of vitrous cells  Impression:   History of candidemia with associated endopthalmitis OD and progression of  subretinal lesion OS   Cataracts OU  Recommendations/Plan:  Intravitreal Voriconazaole OU today.  100 ug of Voriconozole was injected in the vitreous of both eyes without complication after informed consent was obtatined and subconjunctival lidocaine was administered.  I've discussed these findings with the nurse.   Please contact our office with any questions or concerns at [phone].   Royston Cowper

## 2017-07-10 NOTE — Progress Notes (Addendum)
Subjective: No new complaints   Antibiotics:  Anti-infectives    Start     Dose/Rate Route Frequency Ordered Stop   07/09/17 1530  voriconazole SOLR 500 mcg     500 mcg Intravitreal To Surgery 07/09/17 1458 07/10/17 1530   07/09/17 1315  fluconazole (DIFLUCAN) IVPB 800 mg     800 mg 100 mL/hr over 240 Minutes Intravenous Every 24 hours 07/09/17 1314     07/09/17 1000  anidulafungin (ERAXIS) 100 mg in sodium chloride 0.9 % 100 mL IVPB  Status:  Discontinued     100 mg 78 mL/hr over 100 Minutes Intravenous Every 24 hours 07/08/17 0905 07/09/17 1314   07/08/17 0915  anidulafungin (ERAXIS) 200 mg in sodium chloride 0.9 % 200 mL IVPB     200 mg 78 mL/hr over 200 Minutes Intravenous Once 07/08/17 0905 07/08/17 1428   07/07/17 0600  cefpodoxime (VANTIN) tablet 200 mg     200 mg Oral Every 12 hours 07/06/17 1409     07/06/17 1415  fluconazole (DIFLUCAN) tablet 400 mg  Status:  Discontinued     400 mg Oral Daily 07/06/17 1410 07/08/17 0905   07/06/17 0600  cefTRIAXone (ROCEPHIN) 2 g in dextrose 5 % 50 mL IVPB  Status:  Discontinued     2 g 100 mL/hr over 30 Minutes Intravenous Every 24 hours 07/05/17 1502 07/06/17 1409   07/05/17 1515  metroNIDAZOLE (FLAGYL) tablet 500 mg     500 mg Oral Every 8 hours 07/05/17 1502     06/29/17 2300  fluconazole (DIFLUCAN) IVPB 400 mg  Status:  Discontinued     400 mg 100 mL/hr over 120 Minutes Intravenous Every 24 hours 06/28/17 1413 07/06/17 1410   06/29/17 2200  metroNIDAZOLE (FLAGYL) tablet 500 mg  Status:  Discontinued     500 mg Oral Every 8 hours 06/29/17 1918 07/05/17 1427   06/29/17 0600  cefTRIAXone (ROCEPHIN) 2 g in dextrose 5 % 50 mL IVPB  Status:  Discontinued     2 g 100 mL/hr over 30 Minutes Intravenous Every 24 hours 06/28/17 1413 07/05/17 1427   06/28/17 2300  fluconazole (DIFLUCAN) IVPB 800 mg     800 mg 200 mL/hr over 120 Minutes Intravenous  Once 06/28/17 1413 06/29/17 0148   06/28/17 2200  anidulafungin (ERAXIS) 100 mg  in sodium chloride 0.9 % 100 mL IVPB  Status:  Discontinued     100 mg 78 mL/hr over 100 Minutes Intravenous Daily at bedtime 06/27/17 2324 06/28/17 1413   06/28/17 0600  levofloxacin (LEVAQUIN) IVPB 500 mg  Status:  Discontinued     500 mg 100 mL/hr over 60 Minutes Intravenous Every 24 hours 06/28/17 0006 06/28/17 1413   06/28/17 0100  anidulafungin (ERAXIS) 200 mg in sodium chloride 0.9 % 200 mL IVPB     200 mg 78 mL/hr over 200 Minutes Intravenous  Once 06/28/17 0015 06/28/17 0549   06/27/17 2359  metroNIDAZOLE (FLAGYL) IVPB 500 mg  Status:  Discontinued     500 mg 100 mL/hr over 60 Minutes Intravenous Every 8 hours 06/27/17 2324 06/29/17 1918   06/27/17 2359  levofloxacin (LEVAQUIN) IVPB 500 mg  Status:  Discontinued     500 mg 100 mL/hr over 60 Minutes Intravenous Daily at bedtime 06/27/17 2324 06/28/17 0006   06/27/17 2030  vancomycin (VANCOCIN) IVPB 1000 mg/200 mL premix     1,000 mg 200 mL/hr over 60 Minutes Intravenous Every 12 hours 06/27/17 1741 06/28/17 1110  06/27/17 1345  cefUROXime (ZINACEF) 1.5 g in dextrose 5 % 50 mL IVPB     1.5 g 100 mL/hr over 30 Minutes Intravenous To Surgery 06/27/17 1340 06/27/17 1341      Medications: Scheduled Meds: . amLODipine  5 mg Oral Daily  . cefpodoxime  200 mg Oral Q12H  . docusate sodium  100 mg Oral Daily  . feeding supplement (ENSURE ENLIVE)  237 mL Oral BID BM  . gabapentin  300 mg Oral BID  . iron polysaccharides  150 mg Oral Daily  . metroNIDAZOLE  500 mg Oral Q8H  . multivitamin with minerals  1 tablet Oral Daily  . ofloxacin  1 drop Both Eyes QID  . pantoprazole  40 mg Oral Daily  . voriconazole  500 mcg Intravitreal To OR   Continuous Infusions: . sodium chloride    . sodium chloride    . fluconazole (DIFLUCAN) IV 800 mg (07/09/17 1416)  . heparin 850 Units/hr (07/10/17 1026)  . lactated ringers 10 mL/hr at 07/05/17 1005   PRN Meds:.sodium chloride, acetaminophen **OR** acetaminophen, alum & mag hydroxide-simeth,  guaiFENesin-dextromethorphan, morphine injection, ondansetron, oxyCODONE-acetaminophen, phenol    Objective: Weight change: -4 lb 14.4 oz (-2.223 kg)  Intake/Output Summary (Last 24 hours) at 07/10/17 1318 Last data filed at 07/10/17 1303  Gross per 24 hour  Intake             1526 ml  Output             3225 ml  Net            -1699 ml   Blood pressure 119/71, pulse 95, temperature 99.2 F (37.3 C), temperature source Oral, resp. rate 17, height 5\' 2"  (1.575 m), weight 144 lb 12.8 oz (65.7 kg), SpO2 97 %. Temp:  [98.5 F (36.9 C)-99.8 F (37.7 C)] 99.2 F (37.3 C) (10/15 1200) Pulse Rate:  [95-105] 95 (10/15 1200) Resp:  [13-22] 17 (10/15 1200) BP: (113-121)/(65-73) 119/71 (10/15 1200) SpO2:  [96 %-97 %] 97 % (10/15 1200) Weight:  [144 lb 12.8 oz (65.7 kg)] 144 lb 12.8 oz (65.7 kg) (10/15 0400)  Physical Exam: General: Alert and awake, oriented x3,  HEENT: anicteric sclera,  EOMI CVS regular rate, normal r,   Chest:  no wheezing, resp distress Abdomen: soft midline bandage  Sp AKA  Neuro: nonfocal  CBC:    BMET  Recent Labs  07/08/17 0328  NA 137  K 3.7  CL 100*  CO2 28  GLUCOSE 89  BUN 8  CREATININE 0.73  CALCIUM 7.9*     Liver Panel  No results for input(s): PROT, ALBUMIN, AST, ALT, ALKPHOS, BILITOT, BILIDIR, IBILI in the last 72 hours.     Sedimentation Rate No results for input(s): ESRSEDRATE in the last 72 hours. C-Reactive Protein No results for input(s): CRP in the last 72 hours.  Micro Results: Recent Results (from the past 720 hour(s))  Culture, blood (Routine X 2) w Reflex to ID Panel     Status: Abnormal   Collection Time: 06/28/17 11:00 AM  Result Value Ref Range Status   Specimen Description BLOOD LEFT ARM  Final   Special Requests IN PEDIATRIC BOTTLE Blood Culture adequate volume  Final   Culture  Setup Time   Final    GRAM POSITIVE COCCI IN PEDIATRIC BOTTLE CRITICAL RESULT CALLED TO, READ BACK BY AND VERIFIED WITH: N.  Batchelder Pharm.D. 9:55 06/29/17 ([email or web address])    Culture (A)  Final    STAPHYLOCOCCUS  SPECIES (COAGULASE NEGATIVE) THE SIGNIFICANCE OF ISOLATING THIS ORGANISM FROM A SINGLE SET OF BLOOD CULTURES WHEN MULTIPLE SETS ARE DRAWN IS UNCERTAIN. PLEASE NOTIFY THE MICROBIOLOGY DEPARTMENT WITHIN ONE WEEK IF SPECIATION AND SENSITIVITIES ARE REQUIRED.    Report Status 07/01/2017 FINAL  Final  Blood Culture ID Panel (Reflexed)     Status: Abnormal   Collection Time: 06/28/17 11:00 AM  Result Value Ref Range Status   Enterococcus species NOT DETECTED NOT DETECTED Final   Listeria monocytogenes NOT DETECTED NOT DETECTED Final   Staphylococcus species DETECTED (A) NOT DETECTED Final    Comment: Methicillin (oxacillin) resistant coagulase negative staphylococcus. Possible blood culture contaminant (unless isolated from more than one blood culture draw or clinical case suggests pathogenicity). No antibiotic treatment is indicated for blood  culture contaminants. CRITICAL RESULT CALLED TO, READ BACK BY AND VERIFIED WITH: N. Batchelder Pharm.D. 9:55 06/29/17 ([email or web address])    Staphylococcus aureus NOT DETECTED NOT DETECTED Final   Methicillin resistance DETECTED (A) NOT DETECTED Final    Comment: CRITICAL RESULT CALLED TO, READ BACK BY AND VERIFIED WITH: N. Batchelder Pharm.D. 9:55 06/29/17 ([email or web address])    Streptococcus species NOT DETECTED NOT DETECTED Final   Streptococcus agalactiae NOT DETECTED NOT DETECTED Final   Streptococcus pneumoniae NOT DETECTED NOT DETECTED Final   Streptococcus pyogenes NOT DETECTED NOT DETECTED Final   Acinetobacter baumannii NOT DETECTED NOT DETECTED Final   Enterobacteriaceae species NOT DETECTED NOT DETECTED Final   Enterobacter cloacae complex NOT DETECTED NOT DETECTED Final   Escherichia coli NOT DETECTED NOT DETECTED Final   Klebsiella oxytoca NOT DETECTED NOT DETECTED Final   Klebsiella pneumoniae NOT DETECTED NOT DETECTED Final   Proteus species NOT DETECTED NOT DETECTED Final     Serratia marcescens NOT DETECTED NOT DETECTED Final   Haemophilus influenzae NOT DETECTED NOT DETECTED Final   Neisseria meningitidis NOT DETECTED NOT DETECTED Final   Pseudomonas aeruginosa NOT DETECTED NOT DETECTED Final   Candida albicans NOT DETECTED NOT DETECTED Final   Candida glabrata NOT DETECTED NOT DETECTED Final   Candida krusei NOT DETECTED NOT DETECTED Final   Candida parapsilosis NOT DETECTED NOT DETECTED Final   Candida tropicalis NOT DETECTED NOT DETECTED Final  Culture, blood (Routine X 2) w Reflex to ID Panel     Status: None   Collection Time: 06/28/17 11:09 AM  Result Value Ref Range Status   Specimen Description BLOOD LEFT HAND  Final   Special Requests IN PEDIATRIC BOTTLE Blood Culture adequate volume  Final   Culture NO GROWTH 5 DAYS  Final   Report Status 07/03/2017 FINAL  Final  Body fluid culture     Status: None   Collection Time: 07/01/17  2:27 PM  Result Value Ref Range Status   Specimen Description FLUID PELVIS RIGHT  Final   Special Requests Normal  Final   Gram Stain   Final    FEW WBC PRESENT, PREDOMINANTLY MONONUCLEAR NO ORGANISMS SEEN    Culture NO GROWTH 3 DAYS  Final   Report Status 07/04/2017 FINAL  Final  MRSA PCR Screening     Status: None   Collection Time: 07/05/17  5:58 AM  Result Value Ref Range Status   MRSA by PCR NEGATIVE NEGATIVE Final    Comment:        The GeneXpert MRSA Assay (FDA approved for NASAL specimens only), is one component of a comprehensive MRSA colonization surveillance program. It is not intended to diagnose MRSA infection nor to guide or monitor treatment for MRSA infections.  Culture, blood (Routine X 2) w Reflex to ID Panel     Status: None (Preliminary result)   Collection Time: 07/08/17 12:03 PM  Result Value Ref Range Status   Specimen Description BLOOD RIGHT HAND  Final   Special Requests   Final    BOTTLES DRAWN AEROBIC AND ANAEROBIC Blood Culture adequate volume   Culture NO GROWTH 2 DAYS   Final   Report Status PENDING  Incomplete  Culture, blood (Routine X 2) w Reflex to ID Panel     Status: None (Preliminary result)   Collection Time: 07/08/17 12:04 PM  Result Value Ref Range Status   Specimen Description BLOOD LEFT HAND  Final   Special Requests   Final    BOTTLES DRAWN AEROBIC AND ANAEROBIC Blood Culture results may not be optimal due to an excessive volume of blood received in culture bottles   Culture NO GROWTH 2 DAYS  Final   Report Status PENDING  Incomplete    Studies/Results: Ct Abdomen Pelvis W Contrast  Result Date: 07/10/2017 CLINICAL DATA:  66 y/o  F; evaluate abscess drain. EXAM: CT ABDOMEN AND PELVIS WITH CONTRAST TECHNIQUE: Multidetector CT imaging of the abdomen and pelvis was performed using the standard protocol following bolus administration of intravenous contrast. CONTRAST:  157mL ISOVUE-300 IOPAMIDOL (ISOVUE-300) INJECTION 61% COMPARISON:  07/03/2017 CT abdomen and pelvis. FINDINGS: Lower chest: No acute abnormality. Hepatobiliary: No focal liver abnormality is seen. No gallstones, gallbladder wall thickening, or biliary dilatation. Pancreas: Unremarkable. No pancreatic ductal dilatation or surrounding inflammatory changes. Spleen: Normal in size without focal abnormality. Adrenals/Urinary Tract: Stable nodularity of left adrenal gland. Stable right kidney upper pole cyst measuring 20 HU. No hydronephrosis or urinary stone disease. Normal bladder. Stomach/Bowel: Normal stomach. Interval increase proximal distention of small bowel. Colon is unremarkable. Vascular/Lymphatic: Aortic atherosclerosis. No enlarged abdominal or pelvic lymph nodes. Reproductive: Status post hysterectomy. No adnexal masses. Other: Open midline lower abdominal wound. Decreased size of rim enhancing collection deep to the wound at the peritoneal margin measuring 18 x 51 mm (AP by ML series 4 : Image 61). Stable rim enhancing collection in the right hemipelvis measuring 18 mm (4:73).  Decreased size of posterior pelvis rim enhancing collection containing pigtail catheter measuring 14 x 31 mm (AP by ML 4:73). Fluid collection overlying the left femoral vessels without enhancement is increased in size measuring 37 x 43 mm (AP by ML 4:85). Stable adjacent air fluid nonenhancing collection in subcutaneous fat. Musculoskeletal: No fracture is seen. IMPRESSION: 1. Pelvis and lower abdominal rim enhancing fluid collections compatible with abscess are stable to decreased in size. The abscess in the posterior pelvis containing the drainage catheter is decreased in size. No new abscess identified. 2. Increase in size of nonenhancing fluid collection overlying the left femoral vessels, probably postoperative seroma. 3. Increased dilatation of proximal small bowel compatible with obstruction. Electronically Signed   By: Kristine Garbe M.D.   On: 07/10/2017 02:00      Assessment/Plan:  INTERVAL HISTORY:   Pt sp IV voriconazole for endophthalmitis  CT abdomen  Increase in size of nonenhancing fluid collection overlying the left femoral vessels, probably postoperative seroma   Principal Problem:   Critical lower limb ischemia Active Problems:   PAD (peripheral artery disease) (HCC)   Stroke (HCC)   SBO (small bowel obstruction) (HCC)   Intraabdominal fluid collection absess   Candidemia ([place])   Abdominal pain   History of CVA with residual deficit   Tobacco abuse   Acute blood  loss anemia   Post-operative pain   Leukocytosis   Ischemic neuropathy of left foot   DNR (do not resuscitate) discussion   Palliative care by specialist   Pressure injury of skin   Pelvic abscess in female [facility])   Pelvic abscess in female   Fungal endophthalmitis    Anna Hayes is a 66 y.o. female with  Hx of SBO, perforation sp ex lap complicated by abscess, fungemia with C tropicalis, limb ischemia sp embolectomy and angioplasty  #1 3 Fungemia with C tropicalis and Candidal  endophthalmitis. She does not have evidence of CNS involvement.  I would NOW pursue TEE  SHE  Is sp IVitreal  VORICONAZOLE  She is on fluconazole 800mg  which we may be able to switch back over to oral therapy at DC   She should receive 6 weeks of high dose systemic azole treatment and will need to be closely followed by  Ophthalmology  #2 Abdominal abscess: continue cefpodoxime and flagyl to cover bacterial pathogens in abdomen and continue fluconazole  I would continue antibacterial and antifungal therapy until abscess is resolved or if not yet resolved on imaging then for at least a month, NOTE we are keeping the azole at [place] the whole time though  #3  Ischemic foot sp amputation  #4 ? Seroma seen on CT: VVS to evaluate  Dr. Baxter Flattery taking over service tomorrow.   LOS: 13 days   Alcide Evener 07/10/2017, 1:18 PM

## 2017-07-10 NOTE — Progress Notes (Signed)
I met with pt and her daughter at bedside. I do have insurance approval to admit pt to inpt rehab pending bed availability when pt is medically ready for d/c. RN CM, pt and daughter are aware. I will follow up daily. [phone]

## 2017-07-10 NOTE — Progress Notes (Signed)
ANTICOAGULATION CONSULT NOTE - Follow Up Consult  Pharmacy Consult for Heparin Indication: LE ischemia  Patient Measurements: Height: 5\' 2"  (157.5 cm) Weight: 144 lb 12.8 oz (65.7 kg) IBW/kg (Calculated) : 50.1 Heparin Dosing Weight: 69 kg  Labs:  Recent Labs  07/08/17 0328  07/09/17 0230 07/09/17 1100 07/09/17 1816 07/10/17 0241  HGB 8.2*  --  7.9*  --   --  8.2*  HCT 26.8*  --  25.7*  --   --  26.3*  PLT 404*  --  426*  --   --  436*  HEPARINUNFRC 0.58  < > 0.20* 0.45 0.43 0.57  CREATININE 0.73  --   --   --   --   --   < > = values in this interval not displayed.  Estimated Creatinine Clearance: 59.8 mL/min (by C-G formula based on SCr of 0.73 mg/dL).  Assessment: 66 yo F s/p ex lap with lysis of adhesions and small bowel perforation repair at [facility] on 9/24, admitted with ischemic left limb s/p bilateral femoral popliteal embolectomy and left leg fasciotomy on 10/2, BKA 10/10.   Heparin level slightly above goal (goal reduced to 0.3-0.5 due to recent surgery / drain placement).  CBC stable, no bleed documented.   Goal of Therapy:  Heparin level 0.3-0.5 units/ml Monitor platelets by anticoagulation protocol: Yes   Plan:  -Reduce heparin to 850 units/hr. -Daily heparin level and CBC -Monitor for s/sx bleeding   [employer], Pharm.D., BCPS Clinical Pharmacist Pager: [phone] Clinical phone for 07/10/2017 from 8:30-4:00 is [phone]. After 4pm, please call Main Rx [phone]) for assistance. 07/10/2017 10:17 AM

## 2017-07-10 NOTE — Progress Notes (Signed)
Patient ID: Anna Hayes, female   DOB: 1951-02-25, 66 y.o.   MRN: [id number]  This NP visited patient at the bedside as a follow up to  yesterday's [place].  Patient is alert and oriented and in good spirits today. No verbalization regarding complaints  of pain or discomfort.  She is open to all offered and available medical interventions to prolong life and is hopeful for rehabilitation in CIR when ready for disposition.  Discussed with patient the importance of continued conversation with family and their  medical providers regarding overall plan of care and treatment options,  ensuring decisions are within the context of the patients values and GOCs.  Questions and concerns addressed  Time in   1205        Time out   1220  Total time spent on the unit was 20 minutes   Palliative medicine will continue to support holistically.     Greater than 50% of the time was spent in counseling and coordination of care  Wadie Lessen NP  Palliative Medicine Team Team Phone # [phone] Pager [phone]

## 2017-07-10 NOTE — Progress Notes (Signed)
Repeat CT shows smaller fluid collection in pelvis.  We will plan to inject this drain and if no fistulas noted, then will plan to DC her drain.  Alcus Bradly E 11:15 AM 07/10/2017

## 2017-07-10 NOTE — Progress Notes (Signed)
Pt returned from CT scan of abdomen. Resting comfortably in bed with call bell in reach.  Lupita Dawn, RN

## 2017-07-10 NOTE — Care Management Important Message (Signed)
Important Message  Patient Details  Name: Anna Hayes MRN: [id number] Date of Birth: [birth year]   Medicare Important Message Given:  Yes    Nathen May 07/10/2017, 10:19 AM

## 2017-07-10 NOTE — Final Consult Note (Signed)
[facility] Progress Note  5 Days Post-Op  Subjective: CC: Abdominal wound Asked to see patient again by Dr. Tyrell Antonio for concern over abscess on repeat CT abdomen pelvis this AM. Patient is tolerating diet, having bowel function. Per [place] RN wound is decreasing in size and improving. Recent left AKA by vascular surgery.  UOP good. VSS.   Objective: Vital signs in last 24 hours: Temp:  [98.5 F (36.9 C)-99.8 F (37.7 C)] 98.6 F (37 C) (10/15 0900) Pulse Rate:  [99-105] 99 (10/15 0900) Resp:  [13-22] 17 (10/15 0900) BP: (113-121)/(65-73) 120/73 (10/15 0839) SpO2:  [96 %-97 %] 96 % (10/15 0900) Weight:  [65.7 kg (144 lb 12.8 oz)] 65.7 kg (144 lb 12.8 oz) (10/15 0400) Last BM Date: 07/09/17  Intake/Output from previous day: 10/14 0701 - 10/15 0700 In: 1406 [P.O.:1070; I.V.:216] Out: 2725 [Urine:2700; Drains:25] Intake/Output this shift: No intake/output data recorded.  PE: Gen:  Alert, NAD, pleasant Card:  Regular rate and rhythm Pulm:  Normal effort, clear to auscultation bilaterally Abd: Soft, appropriately tender, non-distended, bowel sounds present in all 4 quadrants, no HSM, VAC dressing to midline abdomen with good seal  Skin: warm and dry, no rashes  Psych: A&Ox3   Lab Results:   Recent Labs  07/09/17 0230 07/10/17 0241  WBC 11.4* 10.9*  HGB 7.9* 8.2*  HCT 25.7* 26.3*  PLT 426* 436*   BMET  Recent Labs  07/08/17 0328  NA 137  K 3.7  CL 100*  CO2 28  GLUCOSE 89  BUN 8  CREATININE 0.73  CALCIUM 7.9*   PT/INR No results for input(s): LABPROT, INR in the last 72 hours. CMP     Component Value Date/Time   NA 137 07/08/2017 0328   K 3.7 07/08/2017 0328   CL 100 (L) 07/08/2017 0328   CO2 28 07/08/2017 0328   GLUCOSE 89 07/08/2017 0328   BUN 8 07/08/2017 0328   CREATININE 0.73 07/08/2017 0328   CALCIUM 7.9 (L) 07/08/2017 0328   PROT 5.2 (L) 07/01/2017 0621   ALBUMIN 1.6 (L) 07/01/2017 0621   AST 32 07/01/2017 0621   ALT 17  07/01/2017 0621   ALKPHOS 47 07/01/2017 0621   BILITOT 0.6 07/01/2017 0621   GFRNONAA >60 07/08/2017 0328   GFRAA >60 07/08/2017 0328   Lipase  No results found for: LIPASE     Studies/Results: Ct Abdomen Pelvis W Contrast  Result Date: 07/10/2017 CLINICAL DATA:  66 y/o  F; evaluate abscess drain. EXAM: CT ABDOMEN AND PELVIS WITH CONTRAST TECHNIQUE: Multidetector CT imaging of the abdomen and pelvis was performed using the standard protocol following bolus administration of intravenous contrast. CONTRAST:  166mL ISOVUE-300 IOPAMIDOL (ISOVUE-300) INJECTION 61% COMPARISON:  07/03/2017 CT abdomen and pelvis. FINDINGS: Lower chest: No acute abnormality. Hepatobiliary: No focal liver abnormality is seen. No gallstones, gallbladder wall thickening, or biliary dilatation. Pancreas: Unremarkable. No pancreatic ductal dilatation or surrounding inflammatory changes. Spleen: Normal in size without focal abnormality. Adrenals/Urinary Tract: Stable nodularity of left adrenal gland. Stable right kidney upper pole cyst measuring 20 HU. No hydronephrosis or urinary stone disease. Normal bladder. Stomach/Bowel: Normal stomach. Interval increase proximal distention of small bowel. Colon is unremarkable. Vascular/Lymphatic: Aortic atherosclerosis. No enlarged abdominal or pelvic lymph nodes. Reproductive: Status post hysterectomy. No adnexal masses. Other: Open midline lower abdominal wound. Decreased size of rim enhancing collection deep to the wound at the peritoneal margin measuring 18 x 51 mm (AP by ML series 4 : Image 61). Stable rim enhancing collection  in the right hemipelvis measuring 18 mm (4:73). Decreased size of posterior pelvis rim enhancing collection containing pigtail catheter measuring 14 x 31 mm (AP by ML 4:73). Fluid collection overlying the left femoral vessels without enhancement is increased in size measuring 37 x 43 mm (AP by ML 4:85). Stable adjacent air fluid nonenhancing collection in  subcutaneous fat. Musculoskeletal: No fracture is seen. IMPRESSION: 1. Pelvis and lower abdominal rim enhancing fluid collections compatible with abscess are stable to decreased in size. The abscess in the posterior pelvis containing the drainage catheter is decreased in size. No new abscess identified. 2. Increase in size of nonenhancing fluid collection overlying the left femoral vessels, probably postoperative seroma. 3. Increased dilatation of proximal small bowel compatible with obstruction. Electronically Signed   By: Kristine Garbe M.D.   On: 07/10/2017 02:00    Anti-infectives: Anti-infectives    Start     Dose/Rate Route Frequency Ordered Stop   07/09/17 1530  voriconazole SOLR 500 mcg     500 mcg Intravitreal To Surgery 07/09/17 1458 07/10/17 1530   07/09/17 1315  fluconazole (DIFLUCAN) IVPB 800 mg     800 mg 100 mL/hr over 240 Minutes Intravenous Every 24 hours 07/09/17 1314     07/09/17 1000  anidulafungin (ERAXIS) 100 mg in sodium chloride 0.9 % 100 mL IVPB  Status:  Discontinued     100 mg 78 mL/hr over 100 Minutes Intravenous Every 24 hours 07/08/17 0905 07/09/17 1314   07/08/17 0915  anidulafungin (ERAXIS) 200 mg in sodium chloride 0.9 % 200 mL IVPB     200 mg 78 mL/hr over 200 Minutes Intravenous Once 07/08/17 0905 07/08/17 1428   07/07/17 0600  cefpodoxime (VANTIN) tablet 200 mg     200 mg Oral Every 12 hours 07/06/17 1409     07/06/17 1415  fluconazole (DIFLUCAN) tablet 400 mg  Status:  Discontinued     400 mg Oral Daily 07/06/17 1410 07/08/17 0905   07/06/17 0600  cefTRIAXone (ROCEPHIN) 2 g in dextrose 5 % 50 mL IVPB  Status:  Discontinued     2 g 100 mL/hr over 30 Minutes Intravenous Every 24 hours 07/05/17 1502 07/06/17 1409   07/05/17 1515  metroNIDAZOLE (FLAGYL) tablet 500 mg     500 mg Oral Every 8 hours 07/05/17 1502     06/29/17 2300  fluconazole (DIFLUCAN) IVPB 400 mg  Status:  Discontinued     400 mg 100 mL/hr over 120 Minutes Intravenous Every 24  hours 06/28/17 1413 07/06/17 1410   06/29/17 2200  metroNIDAZOLE (FLAGYL) tablet 500 mg  Status:  Discontinued     500 mg Oral Every 8 hours 06/29/17 1918 07/05/17 1427   06/29/17 0600  cefTRIAXone (ROCEPHIN) 2 g in dextrose 5 % 50 mL IVPB  Status:  Discontinued     2 g 100 mL/hr over 30 Minutes Intravenous Every 24 hours 06/28/17 1413 07/05/17 1427   06/28/17 2300  fluconazole (DIFLUCAN) IVPB 800 mg     800 mg 200 mL/hr over 120 Minutes Intravenous  Once 06/28/17 1413 06/29/17 0148   06/28/17 2200  anidulafungin (ERAXIS) 100 mg in sodium chloride 0.9 % 100 mL IVPB  Status:  Discontinued     100 mg 78 mL/hr over 100 Minutes Intravenous Daily at bedtime 06/27/17 2324 06/28/17 1413   06/28/17 0600  levofloxacin (LEVAQUIN) IVPB 500 mg  Status:  Discontinued     500 mg 100 mL/hr over 60 Minutes Intravenous Every 24 hours 06/28/17 0006 06/28/17 1413  06/28/17 0100  anidulafungin (ERAXIS) 200 mg in sodium chloride 0.9 % 200 mL IVPB     200 mg 78 mL/hr over 200 Minutes Intravenous  Once 06/28/17 0015 06/28/17 0549   06/27/17 2359  metroNIDAZOLE (FLAGYL) IVPB 500 mg  Status:  Discontinued     500 mg 100 mL/hr over 60 Minutes Intravenous Every 8 hours 06/27/17 2324 06/29/17 1918   06/27/17 2359  levofloxacin (LEVAQUIN) IVPB 500 mg  Status:  Discontinued     500 mg 100 mL/hr over 60 Minutes Intravenous Daily at bedtime 06/27/17 2324 06/28/17 0006   06/27/17 2030  vancomycin (VANCOCIN) IVPB 1000 mg/200 mL premix     1,000 mg 200 mL/hr over 60 Minutes Intravenous Every 12 hours 06/27/17 1741 06/28/17 1110   06/27/17 1345  cefUROXime (ZINACEF) 1.5 g in dextrose 5 % 50 mL IVPB     1.5 g 100 mL/hr over 30 Minutes Intravenous To Surgery 06/27/17 1340 06/27/17 1341       Assessment/Plan Lower limb ischemia - s/p left AKA Dr. Bridgett Larsson 07/05/17, management per [facility] PAD HTN H/o stroke with residual left sided weakness Anemia - S/p 2 units PRBC 10/2. Hg 7.8, stable AKI - Cr 0.75, trending  down Fungemia candida tropicalis - fluconazole x2 weeks per ID  SBO - s/p exploratory laparotomy with lysis of adhesions and small bowel perforation repair for [facility] by Dr. Noberto Retort on 06/19/17 - s/p IR placement of transgluteal drain 10/1 for pelvic fluid collection seen on CT scan with only "thin fluid" returned. IR following here. - tolerating diet and having BMs - CT today shows abscess decreased in size  - WBC 10.9 and trending down, afebrile - continue abx  ID - PO flagyl, PO cefpodoxime, IV fluconazole VTE - heparin FEN - regular diet; Boost Foley - none Follow up - Dr. Noberto Retort  Plan - Continue abx. Continue VAC dressing changes MWF. She may follow up with Dr. Noberto Retort in [place] when she leaves the hospital. Drain care per IR.   We will sign off at this time, she should follow up with her surgeon in [place]. Please re-consult if new issues arise.   LOS: 13 days    Brigid Re , [facility] 07/10/2017, 11:30 AM Pager: [phone] Consults: [phone] Mon-Fri 7:00 am-4:30 pm Sat-Sun 7:00 am-11:30 am

## 2017-07-10 NOTE — PMR Pre-admission (Signed)
PMR Admission Coordinator Pre-Admission Assessment  Patient: Anna Hayes is an 66 y.o., female MRN: [id number] DOB: May 22, 1951 Height: 5\' 2"  (157.5 cm) Weight: 64 kg (141 lb)              Insurance Information HMO: yes    PPO:      PCP:      IPA:      80/20:      OTHER: Medicare advantage plan PRIMARY: [employer]      Policy#: [id number]      Subscriber: pt CM Name: Fulton Mole      Phone#: [phone]     Fax#: [phone] Pre-Cert#: [id number] approved for 7 days with updates due to Vevelyn Royals phone [phone] fax [phone]      Employer: retired Benefits:  Phone #: [phone]     Name: 07/07/2017 Eff. Date: 09/26/2016     Deduct: none      Out of Pocket Max: $4400      Life Max: none CIR: $345 copay per day days 1 through 5 then insurance covers 100%      SNF: no copay days 1 through 20; $160 copay per day days 21-48; no copay days 49-100 Outpatient: $40 copay per visit     Co-Pay: visits per medical neccessity Home Health: 100%      Co-Pay: visits per medical neccessity DME: 80%     Co-Pay: 20% Providers: in network  SECONDARY: none      Medicaid Application Date:       Case Manager:  Disability Application Date:       Case Worker:   Emergency Contact Information Contact Information    Name Relation Home Work Rhodell Daughter   [phone]     Current Medical History  Patient Admitting Diagnosis: Debility  History of Present Illness: HPI: Riana Tessmer [facility] a 66 y.o.femalewith history of palpitations, PAD, tobacco use, CVA with residual Left HP.  Patient was who was admitted to [facility] with SBO who underwent lysis of adhesions and repair of perforated bowel 06/19/17. History taken from chart review. Hospital course complicated by fevers with leucocytosis due to s/p drain placement 10/1, fungemia and development of ischemic LLE due to occlusion of left popliteal and femoral arteries as well as thrombus in right femoral  artery. She was started on IV heparin and transferred to [facility] on 10/2 due as patient with significant pain with motor and sensory deficits. She was taken to OR for BLE thrombectomy with left 4 compartment fasciotomy by Dr. Donavan Burnet ischemic changes of left lower extremity and underwent left AKA 07/05/2017 per Dr. Bridgett Larsson.Hospital course pain management as well as acute blood loss anemia 8.2 and monitored.Ophthalmology consulted to 07/07/2017 for suspect fungemis with endophthalamis OD>OS and placed on antifungal agents as well as follow-up per infectious disease.she also remains on Vantin as well as Flagyl to cover bacterial pathogens of abdominal abscess and plan to continue for 1 month and then drop the fluconazole 200 mg daily 2 week course.Underwent  [place] Vitrectomy, membrane peeling 07/11/2017 per ophthalmology. A TEE was completed showing no vegetation. Abdominal drain removed 07/11/2017. Patient continues on heparin therapy already planned for oral anticoagulation. Therapy evaluations done today revealing difficulty WB on LE with instability RLE and pain affecting mobility.   Past Medical History  Past Medical History:  Diagnosis Date  . Frequent headaches   . Hypertension   . Muscle pain   . Palpitations   . Sinus problem   .  Stroke [facility])     Family History  family history is not on file.  Prior Rehab/Hospitalizations:  Has the patient had major surgery during 100 days prior to admission? No  Current Medications   Current Facility-Administered Medications:  .  0.9 %  sodium chloride infusion, , Intravenous, Once, Inda Coke, CRNA .  0.9 %  sodium chloride infusion, 500 mL, Intravenous, Once PRN, Serafina Mitchell, MD .  0.9 %  sodium chloride infusion, , Intravenous, Continuous, Josephine Igo, MD .  acetaminophen (TYLENOL) tablet 325-650 mg, 325-650 mg, Oral, Q4H PRN, 650 mg at 07/11/17 0558 **OR** acetaminophen (TYLENOL) suppository 325-650 mg, 325-650 mg,  Rectal, Q4H PRN, Serafina Mitchell, MD .  alum & mag hydroxide-simeth (MAALOX/MYLANTA) 200-200-20 MG/5ML suspension 15-30 mL, 15-30 mL, Oral, Q2H PRN, Serafina Mitchell, MD .  amLODipine (NORVASC) tablet 5 mg, 5 mg, Oral, Daily, Serafina Mitchell, MD, 5 mg at 07/12/17 1039 .  cefpodoxime Bennie Pierini) tablet 200 mg, 200 mg, Oral, Q12H, Tommy Medal, Lavell Islam, MD, 200 mg at 07/11/17 2112 .  docusate sodium (COLACE) capsule 100 mg, 100 mg, Oral, Daily, Serafina Mitchell, MD, 100 mg at 07/10/17 0918 .  feeding supplement (ENSURE ENLIVE) (ENSURE ENLIVE) liquid 237 mL, 237 mL, Oral, BID BM, Samuella Cota, MD, 237 mL at 07/10/17 1424 .  fluconazole (DIFLUCAN) IVPB 800 mg, 800 mg, Intravenous, Q24H, Tommy Medal, Lavell Islam, MD, Stopped at 07/11/17 1819 .  gabapentin (NEURONTIN) capsule 300 mg, 300 mg, Oral, BID, Serafina Mitchell, MD, 300 mg at 07/12/17 1039 .  guaiFENesin-dextromethorphan (ROBITUSSIN DM) 100-10 MG/5ML syrup 15 mL, 15 mL, Oral, Q4H PRN, Serafina Mitchell, MD .  iron polysaccharides (NIFEREX) capsule 150 mg, 150 mg, Oral, Daily, Regalado, Belkys A, MD, 150 mg at 07/12/17 1039 .  lactated ringers infusion, , Intravenous, Continuous, Lynda Rainwater, MD, Last Rate: 10 mL/hr at 07/05/17 1005 .  metroNIDAZOLE (FLAGYL) tablet 500 mg, 500 mg, Oral, Q8H, Tommy Medal, Lavell Islam, MD, 500 mg at 07/12/17 0542 .  morphine 2 MG/ML injection 2-5 mg, 2-5 mg, Intravenous, Q1H PRN, Serafina Mitchell, MD, 4 mg at 07/10/17 9798 .  multivitamin with minerals tablet 1 tablet, 1 tablet, Oral, Daily, Samuella Cota, MD, 1 tablet at 07/12/17 1039 .  ofloxacin (OCUFLOX) 0.3 % ophthalmic solution 1 drop, 1 drop, Both Eyes, QID, Jalene Mullet, MD, 1 drop at 07/11/17 2115 .  ondansetron (ZOFRAN) injection 4 mg, 4 mg, Intravenous, Q6H PRN, Serafina Mitchell, MD, 4 mg at 06/28/17 1244 .  oxyCODONE-acetaminophen (PERCOCET/ROXICET) 5-325 MG per tablet 1-2 tablet, 1-2 tablet, Oral, Q4H PRN, Conrad Plymouth, MD, 1 tablet at 07/12/17  0825 .  pantoprazole (PROTONIX) EC tablet 40 mg, 40 mg, Oral, Daily, Serafina Mitchell, MD, 40 mg at 07/12/17 1039 .  phenol (CHLORASEPTIC) mouth spray 1 spray, 1 spray, Mouth/Throat, PRN, Serafina Mitchell, MD .  rivaroxaban (XARELTO) tablet 20 mg, 20 mg, Oral, Q supper, Wynell Balloon, RPH .  voriconazole SOLR 500 mcg, 500 mcg, Intravitreal, To OR, Jalene Mullet, MD .  voriconazole SOLR 500 mcg, 500 mcg, Intravitreal, To OR, Jalene Mullet, MD  Patients Current Diet: Diet Heart Room service appropriate? Yes; Fluid consistency: Thin  Precautions / Restrictions Precautions Precautions: None Restrictions Weight Bearing Restrictions: Yes LLE Weight Bearing: Non weight bearing   Has the patient had 2 or more falls or a fall with injury in the past year?No  Prior Activity Level Community (5-7x/wk): Mod I with  quad cand, Independent with adls, flalccid LUE  Home Assistive Devices / [place] Devices/Equipment: Environmental consultant (specify type) Home Equipment: Cane - quad, Shower seat  Prior Device Use: Indicate devices/aids used by the patient prior to current illness, exacerbation or injury? cane   Prior Functional Level Prior Function Level of Independence: Independent with assistive device(s) Comments: uses quad cane for ambulation, attends Silver sneakers, independent with bathing and grooming   Self Care: Did the patient need help bathing, dressing, using the toilet or eating?  Independent  Indoor Mobility: Did the patient need assistance with walking from room to room (with or without device)? Independent  Stairs: Did the patient need assistance with internal or external stairs (with or without device)? Independent  Functional Cognition: Did the patient need help planning regular tasks such as shopping or remembering to take medications? Needed some help  Current Functional Level Cognition  Overall Cognitive Status: Within Functional Limits for tasks  assessed Orientation Level: Oriented X4    Extremity Assessment (includes Sensation/Coordination)  Upper Extremity Assessment: LUE deficits/detail LUE Deficits / Details: History of CVA with R hand contracture and increased tone.   Lower Extremity Assessment: Defer to PT evaluation RLE Deficits / Details: R hip and knee ROM limited by groin incision, strength grossly 3/5 RLE: Unable to fully assess due to pain LLE Deficits / Details: L LE ROM limited secondary to surgical pain and previous CVA, d LLE: Unable to fully assess due to pain LLE Sensation: decreased light touch (decreased below knee) LLE Coordination: decreased fine motor, decreased gross motor    ADLs  Overall ADL's : Needs assistance/impaired Eating/Feeding: Set up, Sitting Grooming: Set up, Sitting, Oral care Grooming Details (indicate cue type and reason): step-by-step set-up due to decreased functional use of L UE Upper Body Bathing: Min guard, Sitting Lower Body Bathing: Maximal assistance, Sitting/lateral leans, Sit to/from stand Upper Body Dressing : Minimal assistance, Sitting Lower Body Dressing: Total assistance, Sit to/from stand, Sitting/lateral leans Toilet Transfer: Total assistance Toilet Transfer Details (indicate cue type and reason): uanble  Toileting- Clothing Manipulation and Hygiene: Total assistance, Sit to/from stand Functional mobility during ADLs: Maximal assistance, Total assistance General ADL Comments: Session focused on core and R UE strengthening tasks in preparation for improved ADL independence.     Mobility  Overal bed mobility: Needs Assistance Bed Mobility: Supine to Sit Rolling: Mod assist Supine to sit: HOB elevated, Mod assist Sit to supine: Mod assist General bed mobility comments: assist to elevate trunk into sitting and to scoot hips toward EOB with use of bed pad; cues for technique; use of rail     Transfers  Overall transfer level: Needs assistance Equipment used: 2  person hand held assist Transfer via Lift Equipment: Stedy Transfers: Risk manager, Sit to/from Stand Sit to Stand: Mod assist, +2 physical assistance Stand pivot transfers: Min assist, +2 physical assistance Squat pivot transfers: Max assist, +2 safety/equipment, +2 physical assistance  Lateral/Scoot Transfers: +2 physical assistance, Max assist General transfer comment: cues for R foot placement and R hand placement; therapist blocked R foot to decrease risk of sliding; assist to power up and to gain balance upon stand and then for balance when pivoting R foot     Ambulation / Gait / Stairs / Wheelchair Mobility  Ambulation/Gait General Gait Details: bilat LE too painful this session to attempt    Posture / Balance Dynamic Sitting Balance Sitting balance - Comments: min assist or R UE support Balance Overall balance assessment: Needs assistance  Sitting-balance support: Feet supported Sitting balance-Leahy Scale: Fair Sitting balance - Comments: min assist or R UE support Standing balance support: Bilateral upper extremity supported Standing balance-Leahy Scale: Poor Standing balance comment: Pt requires max A to maintain standing balance.     Special needs/care consideration BiPAP/CPAP  N/a CPM  N/a Continuous Drip IV heparin to Xarelto 07/12/2017 Dialysis  N/a Life Vest  N/a Oxygen  N/a Special Bed overlay mattress Trach Size  N/a Wound Vac 07/12/2017 AM      [] Hide copied text [place] Nurse wound consult note  Vascular team following for assessment and plan of care to left leg wound and vac dressing. Reason for Consult: Vac dressing change to abd wound Wound type: Full thickness post-op wound with sutures visible to inner wound bed Wound bed: Appearance unchanged since previous assessment.  90% red and moist, 10% yellow slough to wound edges, decreasing amt of undermining to wound edges.  12X7X1cm with 2 cm undermining to lower wound edges from 5:00 o'clock to  7:00 o'clock Drainage (amount, consistency, odor) modamt tandrainage in the cannister, no odor Periwound: Intact skin surrounding Dressing procedure/placement/frequency: Applied Mepitel contact layer, then one half piece white foam, then one piece black foam. Pt medicated for pain prior to the procedure and tolerated with mod amt discomfort. WOC will plan to change abd Vac dressing Q M/W/F. Discussed plan of care and pt verbalized understanding. Julien Girt MSN, RN, [place], [place], [place]     RN to contact Dr. Sharol Given 07/12/17 to inquire into removal of AKA LLE incisional wound VAC rmoval  Skin wound VAC to abdomen site as well as AKA VAC to incision                            Bowel mgmt: incontinent LBM 07/12/17 Bladder mgmt: external catheter due to incontinence Diabetic mgmt   N/a   Previous Home Environment Living Arrangements:  (daughter and 110 year old grandson)  Lives With: Daughter (and adult grandson) Available Help at Discharge: Family, Available 24 hours/day Type of Home: House Home Layout: One level Home Access: Stairs to enter Entrance Stairs-Rails: None Technical brewer of Steps: 5 Bathroom Shower/Tub: Optometrist: Yes Lantana: No  Discharge Living Setting Plans for Discharge Living Setting: Patient's home, Lives with (comment) (lives with daughter and adult grandson) Type of Home at Discharge: House Discharge Home Layout: One level Discharge Home Access: Stairs to enter Entrance Stairs-Rails: None Entrance Stairs-Number of Steps: 5 Discharge Bathroom Shower/Tub: Tub/shower unit Discharge Bathroom Toilet: Standard Discharge Bathroom Accessibility: Yes How Accessible: Accessible via walker Does the patient have any problems obtaining your medications?: No  Social/Family/Support Systems Patient Roles: Parent Contact Information: daughter Anticipated Caregiver: daughter and 22 year  old grandson Anticipated Caregiver's Contact Information: see above Ability/Limitations of Caregiver: grandson with pt when daughter at work Caregiver Availability: 24/7 Discharge Plan Discussed with Primary Caregiver: Yes Is Caregiver In Agreement with Plan?: Yes Does Caregiver/Family have Issues with Lodging/Transportation while Pt is in Rehab?: No  Goals/Additional Needs Patient/Family Goal for Rehab: Min to mod asisst with PT and OT at wheelchair level Expected length of stay: ELOS 20-25 days Pt/Family Agrees to Admission and willing to participate: Yes Program Orientation Provided & Reviewed with Pt/Caregiver Including Roles  & Responsibilities: Yes  Decrease burden of Care through IP rehab admission: n/a  Possible need for SNF placement upon discharge: Possibly  Patient Condition: This patient's medical and  functional status has changed since the consult dated: 06/28/2017 in which the Rehabilitation Physician determined and documented that the patient's condition is appropriate for intensive rehabilitative care in an inpatient rehabilitation facility. See "History of Present Illness" (above) for medical update. Functional changes are: mod to max assist. Patient's medical and functional status update has been discussed with the Rehabilitation physician and patient remains appropriate for inpatient rehabilitation. Will admit to inpatient rehab today.  Preadmission Screen Completed By:  Cleatrice Burke, 07/12/2017 12:18 PM ______________________________________________________________________   Discussed status with Dr. Posey Pronto on 07/12/2017 at 1215 and received telephone approval for admission today.  Admission Coordinator:  Cleatrice Burke, time 8719 Date 07/12/2017

## 2017-07-10 NOTE — Progress Notes (Signed)
  Progress Note    07/10/2017 7:42 AM 5 Days Post-Op  Subjective:  No complaints  Tm 99.8 now afebrile VSS  Vitals:   07/10/17 0000 07/10/17 0400  BP: 113/65 113/73  Pulse:    Resp: 16 17  Temp: 98.5 F (36.9 C) 98.8 F (37.1 C)  SpO2: 96%     Physical Exam: Cardiac:  regular Lungs:  Non labored Incisions:  Wound vac with good seal Extremities:  Easily palpable right DP; good movement of left AKA stump   CBC    Component Value Date/Time   WBC 10.9 (H) 07/10/2017 0241   RBC 3.34 (L) 07/10/2017 0241   HGB 8.2 (L) 07/10/2017 0241   HCT 26.3 (L) 07/10/2017 0241   PLT 436 (H) 07/10/2017 0241   MCV 78.7 07/10/2017 0241   MCH 24.6 (L) 07/10/2017 0241   MCHC 31.2 07/10/2017 0241   RDW 20.4 (H) 07/10/2017 0241    BMET    Component Value Date/Time   NA 137 07/08/2017 0328   K 3.7 07/08/2017 0328   CL 100 (L) 07/08/2017 0328   CO2 28 07/08/2017 0328   GLUCOSE 89 07/08/2017 0328   BUN 8 07/08/2017 0328   CREATININE 0.73 07/08/2017 0328   CALCIUM 7.9 (L) 07/08/2017 0328   GFRNONAA >60 07/08/2017 0328   GFRAA >60 07/08/2017 0328    INR    Component Value Date/Time   INR 1.31 07/05/2017 0226     Intake/Output Summary (Last 24 hours) at 07/10/17 0742 Last data filed at 07/10/17 0634  Gross per 24 hour  Intake             1330 ml  Output             2725 ml  Net            -1395 ml     Assessment:  66 y.o. female is s/p:  rle revascularization with palpable dp pulse and now left aka with incisional vac  5 Days Post-Op  Plan: -pt doing well this am-easily palpable right DP pulse -incisional vac on left AKA with good seal-it will be removed prior to discharge.  She is moving the left AKA stump well.   -DVT prophylaxis:  Continue heparin gtt-will need to be bridged to something oral-will d/w Dr. Trula Slade about [facility].   Leontine Locket, PA-C [facility] [phone] 07/10/2017 7:42 AM

## 2017-07-10 NOTE — Progress Notes (Signed)
Physical Therapy Treatment Patient Details Name: Anna Hayes MRN: [id number] DOB: 1950-11-14 Today's Date: 07/10/2017    History of Present Illness Pt is a 66 y.o. female with medical history significant of CVA with left arm weakness present to [place] with a sbo after experincing symptoms for ten days.she went to OR on 9/24 had lysis of adhesions and repair of perforated bowel.  Post op she developed AKI with peak cr of 2.1 this stabalized with ivf.  On 9/27 she started developing some fevers and worsening leukocytosis.  ble u/s was done which was neg for DVT.  Ct scan of abd/pelvis showed a developing absess which was drained by IR on 10/1.  She was started on levaquin /flagyl on 9/27.  Blood cultures from 9/27 also growing out fungus therefore casopofugin was started on 10/1 after their doctor consulted with [facility].  On the evening around 9/30 she then developed an ischemic left limb.  A stat cta with run off was performed with showed bilateral occlusion to both legs.  Pt was then arranged to be transferred here to get emergent vascular surgery which she has gotten s/p thrombectomy of bilateral vessels(see vascular report) and left leg fasciotomy. s/p L AKA 07/05/17    PT Comments    Pt is making good progress towards her goals today despite having 4/10 headache. Pt is currently minAx2 for bed mobility and modAx2 for transfer bed to recliner. Pt requires skilled PT to progress mobility and improve strength and endurance to safely navigate their discharge environment.     Follow Up Recommendations  CIR     Equipment Recommendations  Other (comment) (to be determined at next venue)    Recommendations for Other Services Rehab consult     Precautions / Restrictions Precautions Precautions: None Restrictions Weight Bearing Restrictions: No    Mobility  Bed Mobility Overal bed mobility: Needs Assistance Bed Mobility: Supine to Sit     Supine to sit: +2 for physical  assistance;HOB elevated;Min assist     General bed mobility comments: minAx2 for trunk to upright and pad scoot of hips to EoB  Transfers Overall transfer level: Needs assistance Equipment used: 2 person hand held assist Transfers: Stand Pivot Transfers Sit to Stand: Mod assist;+2 physical assistance Stand pivot transfers: Min assist;+2 physical assistance       General transfer comment: modAx2 for power up with sit>stand, min Ax2 to pivot to chair once in standing     Balance Overall balance assessment: Needs assistance Sitting-balance support: Feet supported;Single extremity supported Sitting balance-Leahy Scale: Fair Sitting balance - Comments: able to static sit without assistance                                    Cognition Arousal/Alertness: Awake/alert Behavior During Therapy: WFL for tasks assessed/performed Overall Cognitive Status: Within Functional Limits for tasks assessed                                           General Comments General comments (skin integrity, edema, etc.): VSS      Pertinent Vitals/Pain Pain Assessment: 0-10 Pain Score: 4  Pain Location: headache Pain Descriptors / Indicators: Throbbing;Headache Pain Intervention(s): Limited activity within patient's tolerance;Patient requesting pain meds-RN notified;RN gave pain meds during session;Monitored during session    Home Living  Living Arrangements:  (daughter and 32 year old grandson)                      PT Goals (current goals can now be found in the care plan section) Acute Rehab PT Goals Patient Stated Goal: feel better PT Goal Formulation: With patient Time For Goal Achievement: 07/12/17 Potential to Achieve Goals: Good Progress towards PT goals: Progressing toward goals    Frequency    Min 3X/week      PT Plan Current plan remains appropriate    Co-evaluation PT/OT/SLP Co-Evaluation/Treatment: Yes            AM-PAC PT  "6 Clicks" Daily Activity  Outcome Measure  Difficulty turning over in bed (including adjusting bedclothes, sheets and blankets)?: Unable Difficulty moving from lying on back to sitting on the side of the bed? : Unable Difficulty sitting down on and standing up from a chair with arms (e.g., wheelchair, bedside commode, etc,.)?: Unable Help needed moving to and from a bed to chair (including a wheelchair)?: A Lot Help needed walking in hospital room?: Total Help needed climbing 3-5 steps with a railing? : Total 6 Click Score: 7    End of Session Equipment Utilized During Treatment: Gait belt Activity Tolerance: Patient tolerated treatment well Patient left: in chair;with call bell/phone within reach;with chair alarm set Nurse Communication: Mobility status PT Visit Diagnosis: Unsteadiness on feet (R26.81);Other abnormalities of gait and mobility (R26.89);Muscle weakness (generalized) (M62.81);Difficulty in walking, not elsewhere classified (R26.2);Hemiplegia and hemiparesis;Pain Hemiplegia - Right/Left: Left Hemiplegia - dominant/non-dominant: Non-dominant Hemiplegia - caused by: Cerebral infarction Pain - Right/Left: Left Pain - part of body: Ankle and joints of foot;Leg     Time: 1351-1415 PT Time Calculation (min) (ACUTE ONLY): 24 min  Charges:  $Therapeutic Activity: 23-37 mins                    G Codes:       Shantel Wesely B. Migdalia Dk PT, DPT Acute Rehabilitation  [phone] Pager [phone]     [place] 07/10/2017, 4:21 PM

## 2017-07-10 NOTE — Consult Note (Signed)
Vascular team following for assessment and plan of care to left leg wound and vac dressing. Reason for Consult: Vac dressing change to abd wound Wound type: Full thickness post-op wound with sutures visible to inner wound bed Wound bed: Appearance unchanged since previous assessment, slowly decreasing in size.  90% red and moist, 10% yellow, no further undermining to wound edges Drainage (amount, consistency, odor) modamt pinkdrainage in the cannister, no odor Periwound: Intact skin surrounding Dressing procedure/placement/frequency: Applied Mepitel contact layer, then one half piece white foam, then one piece black foam. Pt medicated for pain prior to the procedure and tolerated with minimal amt discomfort. WOC will plan to change abd Vac dressing Q M/W/F at 0900. Discussed plan of care and pt verbalized understanding. Julien Girt MSN, RN, [place], [place], [place]

## 2017-07-10 NOTE — Progress Notes (Addendum)
Occupational Therapy Treatment Patient Details Name: Anna Hayes MRN: [id number] DOB: November 16, 1950 Today's Date: 07/10/2017    History of present illness Pt is a 66 y.o. female with medical history significant of CVA with left arm weakness present to [place] with a sbo after experincing symptoms for ten days.she went to OR on 9/24 had lysis of adhesions and repair of perforated bowel.  Post op she developed AKI with peak cr of 2.1 this stabalized with ivf.  On 9/27 she started developing some fevers and worsening leukocytosis.  ble u/s was done which was neg for DVT.  Ct scan of abd/pelvis showed a developing absess which was drained by IR on 10/1.  She was started on levaquin /flagyl on 9/27.  Blood cultures from 9/27 also growing out fungus therefore casopofugin was started on 10/1 after their doctor consulted with [facility].  On the evening around 9/30 she then developed an ischemic left limb.  A stat cta with run off was performed with showed bilateral occlusion to both legs.  Pt was then arranged to be transferred here to get emergent vascular surgery which she has gotten s/p thrombectomy of bilateral vessels(see vascular report) and left leg fasciotomy. s/p L AKA 07/05/17   OT comments  Pt progressing toward OT goals. Session focused on preparatory strengthening and dynamic seated balance activities as a precursor to improved ADL participation. Pt remains very motivated to participate with OT plan of care and was able to complete oral care tasks seated with step-by-step set-up this session. She continues to be an excellent candidate for CIR level rehabilitation post-acute D/C to maximize return to PLOF. Will continue to follow while admitted.    Follow Up Recommendations  CIR;Supervision/Assistance - 24 hour    Equipment Recommendations  None recommended by OT    Recommendations for Other Services Rehab consult    Precautions / Restrictions Precautions Precautions:  None Restrictions Weight Bearing Restrictions: No       Mobility Bed Mobility     General bed mobility comments: OOB in chair after finishing PT session on my arrival.      Balance Overall balance assessment: Needs assistance Sitting-balance support: Feet supported;Single extremity supported Sitting balance-Leahy Scale: Poor Sitting balance - Comments: min assist or R UE support                                   ADL either performed or assessed with clinical judgement   ADL       Grooming: Set up;Sitting;Oral care Grooming Details (indicate cue type and reason): step-by-step set-up due to decreased functional use of L UE                               General ADL Comments: Session focused on core and R UE strengthening tasks in preparation for improved ADL independence.                Cognition Arousal/Alertness: Awake/alert Behavior During Therapy: WFL for tasks assessed/performed Overall Cognitive Status: Within Functional Limits for tasks assessed                                          Exercises Exercises: Other exercises Other Exercises Other Exercises: Facilitated improved core stability and strength with rhythmic  stabilization exercises at B shoulders for 5 repetitions each. Other Exercises: Facilitated R UE strength with chair push-ups with mod assist from therapist on L side x5 repetitions.  Other Exercises: Facilitated improved core stability with functional reaching activities crossing midline with R UE and maintaining seated balance without UE support and up to min assist.       General Comments VSS with HR up to 121 bpm    Pertinent Vitals/ Pain       Pain Assessment: Faces Pain Score: 4  Faces Pain Scale: Hurts even more Pain Location: abdomen; L residual limb Pain Descriptors / Indicators: Operative site guarding;Grimacing Pain Intervention(s): Limited activity within patient's tolerance;Monitored  during session;Repositioned  Home Living   Living Arrangements:  (daughter and 67 year old grandson)                                  Lives With: Daughter (and adult grandson)        Frequency  Min 2X/week        Progress Toward Goals  OT Goals(current goals can now be found in the care plan section)  Progress towards OT goals: Progressing toward goals  Acute Rehab OT Goals Patient Stated Goal: feel better OT Goal Formulation: With patient Time For Goal Achievement: 07/12/17 Potential to Achieve Goals: Good  Plan Discharge plan remains appropriate       AM-PAC PT "6 Clicks" Daily Activity     Outcome Measure   Help from another person eating meals?: A Little Help from another person taking care of personal grooming?: A Little Help from another person toileting, which includes using toliet, bedpan, or urinal?: A Lot Help from another person bathing (including washing, rinsing, drying)?: A Lot Help from another person to put on and taking off regular upper body clothing?: A Lot Help from another person to put on and taking off regular lower body clothing?: A Lot 6 Click Score: 14    End of Session    OT Visit Diagnosis: Unsteadiness on feet (R26.81);Pain Pain - Right/Left: Left Pain - part of body: Leg (residual limb; abdomen)   Activity Tolerance Patient tolerated treatment well   Patient Left with call bell/phone within reach;with family/visitor present;in chair   Nurse Communication Mobility status        Time: 3557-3220 OT Time Calculation (min): 22 min  Charges: OT General Charges $OT Visit: 1 Visit OT Treatments $Therapeutic Activity: 8-22 mins  Norman Herrlich, MS OTR/L  Pager: [phone] 07/10/2017, 4:26 PM

## 2017-07-10 NOTE — Progress Notes (Signed)
Call received from [place], RN to discuss patient having TEE during surgery scheduled with Dr. Posey Pronto 07/11/17. Discussed with OR front desk, stated surgery schedule for 07/10/17 at 1615. Call placed to Dr. Acie Fredrickson to request moving TEE to intraop. Per Dr. Acie Fredrickson, he is unable to accommodate that time. Call placed back to OR and Claiborne Billings, RN to advise that Dr. Acie Fredrickson can not accommodate intraop TEE and will remain as scheduled at this time.

## 2017-07-10 NOTE — Progress Notes (Signed)
PROGRESS NOTE  Anna Hayes LTJ:[id number] DOB: 12-25-1950 DOA: 06/27/2017 PCP: Rochel Brome, MD  HPI/Recap of past 14 hours: 66 year old woman presented with abdominal pain and was admitted to [facility] 9/22. Found to have small bowel obstruction. Underwent expiratory laparotomy with lysis of adhesions and release of small bowel obstruction, repair of small bowel perforation. Subsequently had fevers, leukocytosis and CT abdomen and pelvis 9/29 revealed pelvic abscess and underwent successful placement of percutaneous drain October 1. Also noted to have fungemia on 1/2 blood culture 9/27 and started on antifungal therapy. She subsequently developed left lower stomach pain and discomfort October 1 and underwent CT angiogram which revealed bilateral arterial blockage both lower extremities, was started on heparin and emergently transferred to [facility] where she underwent thrombectomy and fasciotomy October 2.  Since then, vascular surgery has felt that her left foot is nonviable and left BKA has been recommended, but patient is having difficulty making that decision. Palliative medicine was consulted and patient met with them and family today for goals of care discussion.  After this meeting, patient has opted for surgery.  Subjective;  No abdominal pain.  Awaiting for procedure today   Assessment/Plan: Critical left leg ischemia, status post bilateral femoral embolectomy, angioplasty left posterior tibial artery, fasciotomy left leg 10/2. -VVS managing. Left foot nonviable. Patient now amenable to left BKA. Underwent surgery on 10/10 -repeat cbc tonight. -Heparin per vascular. Will follow vascular recommendation for oral anticoagulation.   Fungemia with Endophthalmitis; OD--OS. Left Eye with subretinal fungal infection in the temporal macula; -spoke with Dr Posey Pronto. He recommended MRI brain and orbit with and without contrast.  -he will discussed with ID, regarding resuming IV  antifungal.  -S/P Intra-vitreal  antifungal injection 10-14. -IV fluconazole.  -Follow ID recommendation for tx.  -Need TEE, cardiology consulted, plan for Tuesday.   Abdominal abscess, Status post laparotomy, lysis of adhesions and repair of perforated bowel 9/24 at [facility]. -per general surgery. Wound VAC. - status post percutaneous drain placement October 1.  -WBC slowly trending down. Continue abx as per ID  -CT done 10/8 notes persistent fluid collections.  -CT abdomen 10-15. Pelvis and lower abdominal rim enhancing fluid collections compatible with abscess are stable to decreased in size. The abscess in the posterior pelvis containing the drainage catheter is decreased in size. No new abscess identified. Increase in size of nonenhancing fluid collection overlying the left femoral vessels, probably postoperative seroma. Surgery recommend to continue with wound vac until follow up with outpatient surgeon.  Per ID; continue with antibacterial therapy until abscess is resolved or she has had at least a month of therapy.  Antibiotics change to cefopoxime and flagyl.   Fungemia Candida tropicalis  -now with endophthalmitis.  -On IV Eraxis  -Plan for TEE tomorrow.  repeated blood culture 10-03 and 10-13 negative.    Hypoalbuminemia.  -diet per surgery .  -supplements per dietician   anemia, likely multifactorial including perioperative blood loss. S/p 2 units PRBC 10/2. -Hgb stable.   Start nu iron   Obesity unspecified: meets criteria BMI greater than 30  Status post AKI: Now resolved  PMH stroke with left upper and left lower extremity weakness, left facial droop  Code Status: Full code   Family Communication: patient.   Disposition Plan: CIR consulted. Needs TEE   Consultants: Vascular surgery General surgery Infectious disease Interventional radiology Physical medicine and rehabilitation   Procedures:  9/24: Exploratory laparoscopy with lysis of adhesions  and release of small bowel obstruction, repair  of small bowel perforation  10/2: Transfused 2 units packed red blood cells  10/1: Percutaneous drain placement and right buttock for pelvic abscess  10/2: Bilateral femoral popliteal embolectomy, left posterior tibial artery patch angioplasty and left lower leg fasciotomy   10/10: Planned amputation  Antimicrobials: Anidulafungin 10/2 >>10/3 Fluconazole 10/3 >> 10/17 Levaquin 10/2 >>ceftriaxone   Metronidazole 10/2 >>   DVT prophylaxis:  Heparin   Objective: Vitals:   07/09/17 2044 07/10/17 0000 07/10/17 0400 07/10/17 0839  BP: 121/68 113/65 113/73 120/73  Pulse:    (!) 105  Resp: _0 (!) 22  Temp: 99.8 F (37.7 C) 98.5 F (36.9 C) 98.8 F (37.1 C) 98.7 F (37.1 C)  TempSrc: Oral Oral Oral Oral  SpO2:  96%  97%  Weight:   65.7 kg (144 lb 12.8 oz)   Height:        Intake/Output Summary (Last 24 hours) at 07/10/17 0928 Last data filed at 07/10/17 0700  Gross per 24 hour  Intake             1286 ml  Output             2725 ml  Net            -1439 ml   Filed Weights   07/08/17 0400 07/09/17 0503 07/10/17 0400  Weight: 68.5 kg (151 lb 1.6 oz) 67.9 kg (149 lb 11.2 oz) 65.7 kg (144 lb 12.8 oz)    Exam:   General: NAD  HEENT; MMM  Neck: Supple, no JVD  Cardiovascular: S 1, S 2 RRR  Respiratory: CTA  Abdomen: soft, NT, ND, wound vac in place.   Musculoskeletal; No cyanosis.   Skin: Left AKA.      Data Reviewed: CBC:  Recent Labs Lab 07/06/17 0808 07/07/17 0905 07/08/17 0328 07/09/17 0230 07/10/17 0241  WBC 18.8* 16.4* 12.8* 11.4* 10.9*  HGB 7.6* 8.0* 8.2* 7.9* 8.2*  HCT 24.1* 25.4* 26.8* 25.7* 26.3*  MCV 77.7* 78.9 78.8 78.8 78.7  PLT 345 394 404* 426* 546*   Basic Metabolic Panel:  Recent Labs Lab 07/04/17 0029 07/04/17 1102 07/05/17 0226 07/06/17 0808 07/07/17 0905 07/08/17 0328  NA 136  --  137 136 138 137  K 3.0*  --  4.1 4.1 3.5 3.7  CL 100*  --  103 101 105 100*  CO2  29  --  _1 GLUCOSE 103*  --  107* 111* 84 89  BUN <5*  --  5* _2 CREATININE 0.88  --  0.75 0.68 0.72 0.73  CALCIUM 7.2*  --  7.4* 7.6* 7.7* 7.9*  MG  --  1.7  --   --   --   --    GFR: Estimated Creatinine Clearance: 59.8 mL/min (by C-G formula based on SCr of 0.73 mg/dL). Liver Function Tests: No results for input(s): AST, ALT, ALKPHOS, BILITOT, PROT, ALBUMIN in the last 168 hours. No results for input(s): LIPASE, AMYLASE in the last 168 hours. No results for input(s): AMMONIA in the last 168 hours. Coagulation Profile:  Recent Labs Lab 07/05/17 0226  INR 1.31   Cardiac Enzymes:  Recent Labs Lab 07/03/17 1152  CKTOTAL 3,206*   BNP (last 3 results) No results for input(s): PROBNP in the last 8760 hours. HbA1C: No results for input(s): HGBA1C in the last 72 hours. CBG: No results for input(s): GLUCAP in the last 168 hours. Lipid Profile: No results for input(s): CHOL, HDL, LDLCALC,  TRIG, CHOLHDL, LDLDIRECT in the last 72 hours. Thyroid Function Tests: No results for input(s): TSH, T4TOTAL, FREET4, T3FREE, THYROIDAB in the last 72 hours. Anemia Panel: No results for input(s): VITAMINB12, FOLATE, FERRITIN, TIBC, IRON, RETICCTPCT in the last 72 hours. Urine analysis: No results found for: COLORURINE, APPEARANCEUR, [place], [place], [place], [place], [place], [place], [place], [place], NITRITE, LEUKOCYTESUR Sepsis Labs: _0 (procalcitonin:4,lacticidven:4)  ) Recent Results (from the past 240 hour(s))  Body fluid culture     Status: None   Collection Time: 07/01/17  2:27 PM  Result Value Ref Range Status   Specimen Description FLUID PELVIS RIGHT  Final   Special Requests Normal  Final   Gram Stain   Final    FEW WBC PRESENT, PREDOMINANTLY MONONUCLEAR NO ORGANISMS SEEN    Culture NO GROWTH 3 DAYS  Final   Report Status 07/04/2017 FINAL  Final  MRSA PCR Screening     Status: None   Collection Time: 07/05/17  5:58 AM  Result Value Ref  Range Status   MRSA by PCR NEGATIVE NEGATIVE Final    Comment:        The GeneXpert MRSA Assay (FDA approved for NASAL specimens only), is one component of a comprehensive MRSA colonization surveillance program. It is not intended to diagnose MRSA infection nor to guide or monitor treatment for MRSA infections.   Culture, blood (Routine X 2) w Reflex to ID Panel     Status: None (Preliminary result)   Collection Time: 07/08/17 12:03 PM  Result Value Ref Range Status   Specimen Description BLOOD RIGHT HAND  Final   Special Requests   Final    BOTTLES DRAWN AEROBIC AND ANAEROBIC Blood Culture adequate volume   Culture NO GROWTH 1 DAY  Final   Report Status PENDING  Incomplete  Culture, blood (Routine X 2) w Reflex to ID Panel     Status: None (Preliminary result)   Collection Time: 07/08/17 12:04 PM  Result Value Ref Range Status   Specimen Description BLOOD LEFT HAND  Final   Special Requests   Final    BOTTLES DRAWN AEROBIC AND ANAEROBIC Blood Culture results may not be optimal due to an excessive volume of blood received in culture bottles   Culture NO GROWTH 1 DAY  Final   Report Status PENDING  Incomplete      Studies: Ct Abdomen Pelvis W Contrast  Result Date: 07/10/2017 CLINICAL DATA:  66 y/o  F; evaluate abscess drain. EXAM: CT ABDOMEN AND PELVIS WITH CONTRAST TECHNIQUE: Multidetector CT imaging of the abdomen and pelvis was performed using the standard protocol following bolus administration of intravenous contrast. CONTRAST:  166m ISOVUE-300 IOPAMIDOL (ISOVUE-300) INJECTION 61% COMPARISON:  07/03/2017 CT abdomen and pelvis. FINDINGS: Lower chest: No acute abnormality. Hepatobiliary: No focal liver abnormality is seen. No gallstones, gallbladder wall thickening, or biliary dilatation. Pancreas: Unremarkable. No pancreatic ductal dilatation or surrounding inflammatory changes. Spleen: Normal in size without focal abnormality. Adrenals/Urinary Tract: Stable nodularity of  left adrenal gland. Stable right kidney upper pole cyst measuring 20 HU. No hydronephrosis or urinary stone disease. Normal bladder. Stomach/Bowel: Normal stomach. Interval increase proximal distention of small bowel. Colon is unremarkable. Vascular/Lymphatic: Aortic atherosclerosis. No enlarged abdominal or pelvic lymph nodes. Reproductive: Status post hysterectomy. No adnexal masses. Other: Open midline lower abdominal wound. Decreased size of rim enhancing collection deep to the wound at the peritoneal margin measuring 18 x 51 mm (AP by ML series 4 : Image 61). Stable rim enhancing collection in the right hemipelvis measuring  18 mm (4:73). Decreased size of posterior pelvis rim enhancing collection containing pigtail catheter measuring 14 x 31 mm (AP by ML 4:73). Fluid collection overlying the left femoral vessels without enhancement is increased in size measuring 37 x 43 mm (AP by ML 4:85). Stable adjacent air fluid nonenhancing collection in subcutaneous fat. Musculoskeletal: No fracture is seen. IMPRESSION: 1. Pelvis and lower abdominal rim enhancing fluid collections compatible with abscess are stable to decreased in size. The abscess in the posterior pelvis containing the drainage catheter is decreased in size. No new abscess identified. 2. Increase in size of nonenhancing fluid collection overlying the left femoral vessels, probably postoperative seroma. 3. Increased dilatation of proximal small bowel compatible with obstruction. Electronically Signed   By: Kristine Garbe M.D.   On: 07/10/2017 02:00    Scheduled Meds: . amLODipine  5 mg Oral Daily  . cefpodoxime  200 mg Oral Q12H  . docusate sodium  100 mg Oral Daily  . feeding supplement (ENSURE ENLIVE)  237 mL Oral BID BM  . gabapentin  300 mg Oral BID  . iron polysaccharides  150 mg Oral Daily  . metroNIDAZOLE  500 mg Oral Q8H  . multivitamin with minerals  1 tablet Oral Daily  . ofloxacin  1 drop Both Eyes QID  . pantoprazole  40  mg Oral Daily  . voriconazole  500 mcg Intravitreal To OR    Continuous Infusions: . sodium chloride    . sodium chloride    . fluconazole (DIFLUCAN) IV 800 mg (07/09/17 1416)  . heparin 900 Units/hr (07/09/17 1143)  . lactated ringers 10 mL/hr at 07/05/17 1005     LOS: 13 days     Elmarie Shiley, MD [facility] Pager [phone]  If 7PM-7AM, please contact night-coverage [email or web address] Password [email or web address] 07/10/2017, 9:28 AM

## 2017-07-10 NOTE — Progress Notes (Signed)
    [facility] has been requested to perform a transesophageal echocardiogram on Anna Hayes for bacteremia.  After careful review of history and examination, the risks and benefits of transesophageal echocardiogram have been explained including risks of esophageal damage, perforation (1:10,000 risk), bleeding, pharyngeal hematoma as well as other potential complications associated with conscious sedation including aspiration, arrhythmia, respiratory failure and death. Alternatives to treatment were discussed, questions were answered. Patient is willing to proceed.   Pt is scheduled for TEE tomorrow 07/11/17 with Dr. Acie Fredrickson at 1000. NPO at MN tonight.  Tami Lin Muntaha [place], [place]  07/10/2017 12:36 PM

## 2017-07-11 ENCOUNTER — Encounter (HOSPITAL_COMMUNITY)
Admission: EM | Disposition: A | Discharge status: Rehabilitation Facility | Payer: Self-pay | Source: Other Acute Inpatient Hospital | Attending: Internal Medicine

## 2017-07-11 ENCOUNTER — Encounter (HOSPITAL_COMMUNITY): Payer: Self-pay | Admitting: *Deleted

## 2017-07-11 ENCOUNTER — Inpatient Hospital Stay (HOSPITAL_COMMUNITY): Payer: Medicare Other

## 2017-07-11 ENCOUNTER — Ambulatory Visit: Payer: Self-pay | Admitting: Ophthalmology

## 2017-07-11 ENCOUNTER — Inpatient Hospital Stay (HOSPITAL_COMMUNITY): Payer: Medicare Other | Admitting: Certified Registered Nurse Anesthetist

## 2017-07-11 DIAGNOSIS — R7881 Bacteremia: Secondary | ICD-10-CM

## 2017-07-11 HISTORY — PX: TEE WITHOUT CARDIOVERSION: SHX5443

## 2017-07-11 HISTORY — PX: REPAIR OF COMPLEX TRACTION RETINAL DETACHMENT: SHX6217

## 2017-07-11 LAB — CBC
HEMATOCRIT: 27.1 % — AB (ref 36.0–46.0)
Hemoglobin: 8.6 g/dL — ABNORMAL LOW (ref 12.0–15.0)
MCH: 25.2 pg — AB (ref 26.0–34.0)
MCHC: 31.7 g/dL (ref 30.0–36.0)
MCV: 79.5 fL (ref 78.0–100.0)
PLATELETS: 463 10*3/uL — AB (ref 150–400)
RBC: 3.41 MIL/uL — AB (ref 3.87–5.11)
RDW: 20.8 % — AB (ref 11.5–15.5)
WBC: 9.8 10*3/uL (ref 4.0–10.5)

## 2017-07-11 LAB — BASIC METABOLIC PANEL
ANION GAP: 7 (ref 5–15)
CO2: 26 mmol/L (ref 22–32)
Calcium: 8.2 mg/dL — ABNORMAL LOW (ref 8.9–10.3)
Chloride: 103 mmol/L (ref 101–111)
Creatinine, Ser: 0.66 mg/dL (ref 0.44–1.00)
GFR calc Af Amer: >60 mL/min (ref >60)
GLUCOSE: 101 mg/dL — AB (ref 65–99)
POTASSIUM: 3.5 mmol/L (ref 3.5–5.1)
Sodium: 136 mmol/L (ref 135–145)

## 2017-07-11 LAB — PROTIME-INR
INR: 1.14
Prothrombin Time: 14.5 seconds (ref 11.4–15.2)

## 2017-07-11 LAB — HEPARIN LEVEL (UNFRACTIONATED): Heparin Unfractionated: <0.1 IU/mL — ABNORMAL LOW (ref 0.30–0.70)

## 2017-07-11 SURGERY — REPAIR, RETINAL DETACHMENT, COMPLEX
Anesthesia: General | Site: Eye | Laterality: Right

## 2017-07-11 SURGERY — REPAIR, RETINAL DETACHMENT, COMPLEX
Anesthesia: General | Laterality: Right

## 2017-07-11 SURGERY — ECHOCARDIOGRAM, TRANSESOPHAGEAL
Anesthesia: Moderate Sedation

## 2017-07-11 MED ORDER — LIDOCAINE HCL (PF) 1 % IJ SOLN
INTRAMUSCULAR | Status: AC
  Filled 2017-07-11: qty 30

## 2017-07-11 MED ORDER — BSS IO SOLN
INTRAOCULAR | Status: DC | PRN
  Administered 2017-07-11: 15 mL via INTRAOCULAR

## 2017-07-11 MED ORDER — MIDAZOLAM HCL 5 MG/5ML IJ SOLN
INTRAMUSCULAR | Status: DC | PRN
  Administered 2017-07-11: 2 mg via INTRAVENOUS

## 2017-07-11 MED ORDER — FENTANYL CITRATE (PF) 100 MCG/2ML IJ SOLN
INTRAMUSCULAR | Status: DC | PRN
  Administered 2017-07-11 (×2): 25 ug via INTRAVENOUS

## 2017-07-11 MED ORDER — TROPICAMIDE 1 % OP SOLN
OPHTHALMIC | Status: AC
  Filled 2017-07-11: qty 15

## 2017-07-11 MED ORDER — BSS IO SOLN
INTRAOCULAR | Status: AC
  Filled 2017-07-11: qty 15

## 2017-07-11 MED ORDER — VORICONAZOLE INTRAOCULAR INJECTION 100 MCG/ 0.1 ML KALEIDOSCOPE
500.0000 ug | INTRAVITREAL | Status: AC
  Administered 2017-07-11: 500 ug via INTRAVITREAL
  Filled 2017-07-11: qty 1

## 2017-07-11 MED ORDER — TETRACAINE HCL 0.5 % OP SOLN
OPHTHALMIC | Status: AC
  Filled 2017-07-11: qty 4

## 2017-07-11 MED ORDER — ONDANSETRON HCL 4 MG/2ML IJ SOLN: 4.0000 mg | Freq: Once | INTRAMUSCULAR | Status: DC | PRN

## 2017-07-11 MED ORDER — MIDAZOLAM HCL 10 MG/2ML IJ SOLN
INTRAMUSCULAR | Status: DC | PRN
  Administered 2017-07-11 (×2): 2 mg via INTRAVENOUS

## 2017-07-11 MED ORDER — OFLOXACIN 0.3 % OP SOLN
1.0000 [drp] | OPHTHALMIC | Status: DC | PRN
  Administered 2017-07-11 (×4): 1 [drp] via OPHTHALMIC

## 2017-07-11 MED ORDER — PHENYLEPHRINE HCL 2.5 % OP SOLN
1.0000 [drp] | OPHTHALMIC | Status: DC | PRN
  Administered 2017-07-11 (×3): 1 [drp] via OPHTHALMIC

## 2017-07-11 MED ORDER — HYPROMELLOSE (GONIOSCOPIC) 2.5 % OP SOLN
OPHTHALMIC | Status: DC | PRN
  Administered 2017-07-11: 1 [drp] via OPHTHALMIC

## 2017-07-11 MED ORDER — HYALURONIDASE HUMAN 150 UNIT/ML IJ SOLN
INTRAMUSCULAR | Status: DC | PRN
  Administered 2017-07-11: 150 [IU]

## 2017-07-11 MED ORDER — STERILE WATER FOR INJECTION IJ SOLN
INTRAMUSCULAR | Status: AC
  Filled 2017-07-11: qty 10

## 2017-07-11 MED ORDER — EPINEPHRINE PF 1 MG/ML IJ SOLN
INTRAMUSCULAR | Status: DC | PRN
  Administered 2017-07-11: 1 mg

## 2017-07-11 MED ORDER — VORICONAZOLE INTRAOCULAR INJECTION 100 MCG/ 0.1 ML KALEIDOSCOPE
500.0000 ug | INTRAVITREAL | Status: AC
  Filled 2017-07-11: qty 1

## 2017-07-11 MED ORDER — TROPICAMIDE 1 % OP SOLN
1.0000 [drp] | OPHTHALMIC | Status: DC | PRN
  Administered 2017-07-11: 15:00:00 via OPHTHALMIC
  Administered 2017-07-11 (×2): 1 [drp] via OPHTHALMIC

## 2017-07-11 MED ORDER — FENTANYL CITRATE (PF) 250 MCG/5ML IJ SOLN
INTRAMUSCULAR | Status: AC
  Filled 2017-07-11: qty 5

## 2017-07-11 MED ORDER — MEPERIDINE HCL 25 MG/ML IJ SOLN: 6.2500 mg | INTRAMUSCULAR | Status: DC | PRN

## 2017-07-11 MED ORDER — LIDOCAINE HCL 2 % IJ SOLN
INTRAMUSCULAR | Status: AC
  Filled 2017-07-11: qty 20

## 2017-07-11 MED ORDER — DEXAMETHASONE SODIUM PHOSPHATE 10 MG/ML IJ SOLN
INTRAMUSCULAR | Status: DC | PRN
  Administered 2017-07-11: 10 mg

## 2017-07-11 MED ORDER — VANCOMYCIN INTRAVITREAL INJECTION 1 MG/0.1 ML
5.0000 mg | INTRAOCULAR | Status: DC
  Filled 2017-07-11: qty 0.5

## 2017-07-11 MED ORDER — FENTANYL CITRATE (PF) 100 MCG/2ML IJ SOLN
INTRAMUSCULAR | Status: DC | PRN
  Administered 2017-07-11 (×2): 25 ug via INTRAVENOUS
  Administered 2017-07-11: 50 ug via INTRAVENOUS

## 2017-07-11 MED ORDER — FENTANYL CITRATE (PF) 100 MCG/2ML IJ SOLN: 25.0000 ug | INTRAMUSCULAR | Status: DC | PRN

## 2017-07-11 MED ORDER — BUTAMBEN-TETRACAINE-BENZOCAINE 2-2-14 % EX AERO
INHALATION_SPRAY | CUTANEOUS | Status: DC | PRN
  Administered 2017-07-11: 2 via TOPICAL

## 2017-07-11 MED ORDER — PHENYLEPHRINE HCL 2.5 % OP SOLN
OPHTHALMIC | Status: AC
  Administered 2017-07-11: 1 [drp] via OPHTHALMIC
  Filled 2017-07-11: qty 2

## 2017-07-11 MED ORDER — BUPIVACAINE HCL (PF) 0.75 % IJ SOLN
INTRAMUSCULAR | Status: AC
  Filled 2017-07-11: qty 30

## 2017-07-11 MED ORDER — LIDOCAINE HCL 2 % IJ SOLN
INTRAMUSCULAR | Status: DC | PRN
  Administered 2017-07-11: 6 mL via OPHTHALMIC

## 2017-07-11 MED ORDER — PROPOFOL 10 MG/ML IV BOLUS
INTRAVENOUS | Status: DC | PRN
  Administered 2017-07-11 (×3): 20 mg via INTRAVENOUS

## 2017-07-11 MED ORDER — HYALURONIDASE HUMAN 150 UNIT/ML IJ SOLN
INTRAMUSCULAR | Status: AC
  Filled 2017-07-11: qty 1

## 2017-07-11 MED ORDER — PROPOFOL 10 MG/ML IV BOLUS
INTRAVENOUS | Status: AC
  Filled 2017-07-11: qty 20

## 2017-07-11 MED ORDER — CEFTAZIDIME 1 G IJ SOLR
INTRAMUSCULAR | Status: AC
  Filled 2017-07-11: qty 1

## 2017-07-11 MED ORDER — BUPIVACAINE HCL (PF) 0.75 % IJ SOLN
INTRAMUSCULAR | Status: AC
  Filled 2017-07-11: qty 10

## 2017-07-11 MED ORDER — SODIUM CHLORIDE 0.9 % IV SOLN: INTRAVENOUS | Status: DC

## 2017-07-11 MED ORDER — EPINEPHRINE PF 1 MG/ML IJ SOLN
INTRAMUSCULAR | Status: AC
  Filled 2017-07-11: qty 1

## 2017-07-11 MED ORDER — STERILE WATER FOR INJECTION IJ SOLN
INTRAMUSCULAR | Status: DC | PRN
  Administered 2017-07-11: 200 mL

## 2017-07-11 MED ORDER — MIDAZOLAM HCL 2 MG/2ML IJ SOLN
INTRAMUSCULAR | Status: AC
  Filled 2017-07-11: qty 2

## 2017-07-11 MED ORDER — DEXAMETHASONE SODIUM PHOSPHATE 10 MG/ML IJ SOLN
INTRAMUSCULAR | Status: AC
  Filled 2017-07-11: qty 1

## 2017-07-11 MED ORDER — TOBRAMYCIN-DEXAMETHASONE 0.3-0.1 % OP OINT
TOPICAL_OINTMENT | OPHTHALMIC | Status: AC
  Filled 2017-07-11: qty 3.5

## 2017-07-11 MED ORDER — TOBRAMYCIN-DEXAMETHASONE 0.3-0.1 % OP OINT
TOPICAL_OINTMENT | OPHTHALMIC | Status: DC | PRN
  Administered 2017-07-11: 1 via OPHTHALMIC

## 2017-07-11 MED ORDER — MIDAZOLAM HCL 5 MG/ML IJ SOLN
INTRAMUSCULAR | Status: AC
  Filled 2017-07-11: qty 2

## 2017-07-11 MED ORDER — LIDOCAINE HCL (CARDIAC) 20 MG/ML IV SOLN
INTRAVENOUS | Status: DC | PRN
  Administered 2017-07-11: 50 mg via INTRAVENOUS

## 2017-07-11 MED ORDER — BSS PLUS IO SOLN
INTRAOCULAR | Status: DC | PRN
  Administered 2017-07-11: 1 via INTRAOCULAR

## 2017-07-11 MED ORDER — FENTANYL CITRATE (PF) 100 MCG/2ML IJ SOLN
INTRAMUSCULAR | Status: AC
  Filled 2017-07-11: qty 2

## 2017-07-11 MED ORDER — BSS PLUS IO SOLN
INTRAOCULAR | Status: AC
  Filled 2017-07-11: qty 500

## 2017-07-11 MED ORDER — HYPROMELLOSE (GONIOSCOPIC) 2.5 % OP SOLN
OPHTHALMIC | Status: AC
  Filled 2017-07-11: qty 15

## 2017-07-11 MED ORDER — PROPOFOL 500 MG/50ML IV EMUL
INTRAVENOUS | Status: DC | PRN
  Administered 2017-07-11: 20 ug/kg/min via INTRAVENOUS

## 2017-07-11 MED ORDER — OFLOXACIN 0.3 % OP SOLN
OPHTHALMIC | Status: AC
  Administered 2017-07-11: 1 [drp] via OPHTHALMIC
  Filled 2017-07-11: qty 5

## 2017-07-11 SURGICAL SUPPLY — 50 items
APL SRG 3 HI ABS STRL LF PLS (MISCELLANEOUS) ×1
APPLICATOR COTTON TIP 6IN STRL (MISCELLANEOUS) ×3 IMPLANT
APPLICATOR DR MATTHEWS STRL (MISCELLANEOUS) ×3 IMPLANT
BLADE MINI 60D BLUE (BLADE) ×3 IMPLANT
BLADE MINI RND TIP GREEN BEAV (BLADE) IMPLANT
CANNULA ANT CHAM MAIN (OPHTHALMIC RELATED) IMPLANT
CANNULA DUALBORE 25G (CANNULA) ×3 IMPLANT
CANNULA VLV SOFT TIP 25G (OPHTHALMIC) IMPLANT
CANNULA VLV SOFT TIP 25GA (OPHTHALMIC) ×3 IMPLANT
CAUTERY EYE LOW TEMP 1300F FIN (OPHTHALMIC RELATED) IMPLANT
CLOSURE WOUND 1/2 X4 (GAUZE/BANDAGES/DRESSINGS) ×1
CORDS BIPOLAR (ELECTRODE) IMPLANT
COVER SURGICAL LIGHT HANDLE (MISCELLANEOUS) ×3 IMPLANT
FILTER BLUE MILLIPORE (MISCELLANEOUS) IMPLANT
FILTER STRAW FLUID ASPIR (MISCELLANEOUS) IMPLANT
FORCEPS GRIESHABER ILM 25G A (INSTRUMENTS) ×2 IMPLANT
GAS OPHTHALMIC (MISCELLANEOUS) IMPLANT
GLOVE ECLIPSE 7.5 STRL STRAW (GLOVE) ×3 IMPLANT
GOWN STRL REUS W/ TWL LRG LVL3 (GOWN DISPOSABLE) ×2 IMPLANT
GOWN STRL REUS W/TWL LRG LVL3 (GOWN DISPOSABLE) ×6
KIT BASIN OR (CUSTOM PROCEDURE TRAY) ×3 IMPLANT
KIT PERFLUORON PROCEDURE 5ML (MISCELLANEOUS) IMPLANT
KIT ROOM TURNOVER OR (KITS) ×3 IMPLANT
NDL 18GX1X1/2 (RX/OR ONLY) (NEEDLE) ×1 IMPLANT
NDL HYPO 25GX1X1/2 BEV (NEEDLE) IMPLANT
NEEDLE 18GX1X1/2 (RX/OR ONLY) (NEEDLE) ×3 IMPLANT
NEEDLE HYPO 25GX1X1/2 BEV (NEEDLE) IMPLANT
NEEDLE HYPO 30X.5 LL (NEEDLE) ×6 IMPLANT
NS IRRIG 1000ML POUR BTL (IV SOLUTION) ×3 IMPLANT
PACK VITRECTOMY CUSTOM (CUSTOM PROCEDURE TRAY) ×3 IMPLANT
PAD ARMBOARD 7.5X6 YLW CONV (MISCELLANEOUS) ×6 IMPLANT
PAK VITRECTOMY PIK 25 GA (OPHTHALMIC RELATED) IMPLANT
REPL STRA BRUSH NEEDLE (NEEDLE) IMPLANT
RESERVOIR BACK FLUSH (MISCELLANEOUS) IMPLANT
RETRACTOR IRIS FLEX 25G GRIESH (INSTRUMENTS) IMPLANT
ROLLS DENTAL (MISCELLANEOUS) IMPLANT
SET FLUID INJECTOR (SET/KITS/TRAYS/PACK) IMPLANT
SHEET MEDIUM DRAPE 40X70 STRL (DRAPES) ×3 IMPLANT
SOLUTION ANTI FOG 6CC (MISCELLANEOUS) ×3 IMPLANT
STOCKINETTE IMPERVIOUS 9X36 MD (GAUZE/BANDAGES/DRESSINGS) ×6 IMPLANT
STOPCOCK 4 WAY LG BORE MALE ST (IV SETS) IMPLANT
STRIP CLOSURE SKIN 1/2X4 (GAUZE/BANDAGES/DRESSINGS) ×1 IMPLANT
SUT ETHILON 5.0 S-24 (SUTURE) ×3 IMPLANT
SUT SILK 2 0 (SUTURE) ×3
SUT SILK 2-0 18XBRD TIE 12 (SUTURE) ×1 IMPLANT
SUT VICRYL 7 0 TG140 8 (SUTURE) IMPLANT
SYR 20CC LL (SYRINGE) IMPLANT
TOWEL OR 17X24 6PK STRL BLUE (TOWEL DISPOSABLE) ×6 IMPLANT
WATER STERILE IRR 1000ML POUR (IV SOLUTION) ×3 IMPLANT
WIPE INSTRUMENT VISIWIPE 73X73 (MISCELLANEOUS) IMPLANT

## 2017-07-11 NOTE — Progress Notes (Signed)
  Echocardiogram Echocardiogram Transesophageal has been performed.  Jannett Celestine 07/11/2017, 1:53 PM

## 2017-07-11 NOTE — Progress Notes (Signed)
CT scan shows resolved fluid collection.  Her drain was removed today with no issues.  Dry dressing applied.  Anna Hayes E 3:45 PM 07/11/2017

## 2017-07-11 NOTE — Transfer of Care (Signed)
Immediate Anesthesia Transfer of Care Note  Patient: Anna Hayes  Procedure(s) Performed: REPAIR OF COMPLEX TRACTION RETINAL DETACHMENT WITH ENDO LASER AND ANTIFUNGAL INJECTION (Right Eye)  Patient Location: PACU  Anesthesia Type:MAC  Level of Consciousness: awake  Airway & Oxygen Therapy: Patient Spontanous Breathing  Post-op Assessment: Report given to RN  Post vital signs: Reviewed and stable  Last Vitals:  Vitals:   07/11/17 1200 07/11/17 1705  BP: 107/68 126/72  Pulse: 88 93  Resp: 20 17  Temp:  36.6 C  SpO2: 98% 100%    Last Pain:  Vitals:   07/11/17 1120  TempSrc: Oral  PainSc:       Patients Stated Pain Goal: 4 (05/10/47 1856)  Complications: No apparent anesthesia complications

## 2017-07-11 NOTE — Care Management Note (Signed)
Case Management Note Marvetta Gibbons RN, BSN Unit 4E-Case Manager [phone]  Patient Details  Name: Anna Hayes MRN: [id number] Date of Birth: 11/26/1950  Subjective/Objective:   Pt. Originally admitted to [facility] 9/22 with small bowel obstruction s/p exp. Lap with lysis of adhesions and repair of small bowel perforation. Post op  had fevers, leukocytosis and CT abdomen and pelvis 9/29 revealed pelvic abscess had placement of percutaneous drain October 1. +  fungemia on 1/2 blood culture 9/27 and started on antifungal therapy. Developed critical left leg ischemia- and emergently transferred to [facility] where she underwent thrombectomy/embolectomy and fasciotomy with wound VAC placement October 2.   Action/Plan: PTA pt lived at home- PT/OT evals pending- CM to follow for d/c needs-   Expected Discharge Date:                  Expected Discharge Plan:  IP Rehab Facility  In-House Referral:  Clinical Social Work  Discharge planning Services  CM Consult  Post Acute Care Choice:  NA Choice offered to:  NA  DME Arranged:    DME Agency:     HH Arranged:    [place] Agency:     Status of Service:  In process, will continue to follow  If discussed at Long Length of Stay Meetings, dates discussed:  10/16  Discharge Disposition:   Additional Comments:  07/11/17- 1145- Marvetta Gibbons RN, CM- spoke with Anna Hayes from [facility]- they continue to follow pt for possible admission- pt for TEE today and then OR for eye surgery later this afternoon with Dr. Posey Pronto- in light of this CIR will look to possibly admit pt tomorrow 10/17- will f/u with Anna Hayes in AM for bed availability.   07/07/17- 1500- Anna Askari RN, CM- pt s/p L BKA - continues with abd wound VAC- also has incisional wound VAC to BKA- CIR continues to follow for possible admission once medically stable for discharge.   Dawayne Patricia, RN 07/11/2017, 11:50 AM

## 2017-07-11 NOTE — H&P (View-Only) (Signed)
    [facility] has been requested to perform a transesophageal echocardiogram on Anna Hayes for bacteremia.  After careful review of history and examination, the risks and benefits of transesophageal echocardiogram have been explained including risks of esophageal damage, perforation (1:10,000 risk), bleeding, pharyngeal hematoma as well as other potential complications associated with conscious sedation including aspiration, arrhythmia, respiratory failure and death. Alternatives to treatment were discussed, questions were answered. Patient is willing to proceed.   Pt is scheduled for TEE tomorrow 07/11/17 with Dr. Acie Fredrickson at 1000. NPO at MN tonight.  Tami Lin [place], [place]  07/10/2017 12:36 PM

## 2017-07-11 NOTE — Progress Notes (Addendum)
ANTICOAGULATION CONSULT NOTE - Follow Up Consult  Pharmacy Consult for Heparin Indication: LE ischemia  Patient Measurements: Height: 5\' 2"  (157.5 cm) Weight: 144 lb 12.8 oz (65.7 kg) IBW/kg (Calculated) : 50.1 Heparin Dosing Weight: 69 kg  Labs:  Recent Labs  07/09/17 0230  07/09/17 1816 07/10/17 0241 07/10/17 1459 07/11/17 0341  HGB 7.9*  --   --  8.2*  --  8.6*  HCT 25.7*  --   --  26.3*  --  27.1*  PLT 426*  --   --  436*  --  463*  LABPROT  --   --   --   --   --  14.5  INR  --   --   --   --   --  1.14  HEPARINUNFRC 0.20*  < > 0.43 0.57  --  <0.10*  CREATININE  --   --   --   --  0.77 0.66  < > = values in this interval not displayed.  Estimated Creatinine Clearance: 59.8 mL/min (by C-G formula based on SCr of 0.66 mg/dL).  Assessment: 66 yo F s/p ex lap with lysis of adhesions and small bowel perforation repair at [facility] on 9/24, admitted with ischemic left limb s/p bilateral femoral popliteal embolectomy and left leg fasciotomy on 10/2, BKA 10/10.   Heparin level undetectable and no infusion issues per RN. CBC stable and no bleeding noted.   For TEE (AM) and retina procedure (PM) on 10/16. No hold orders for heparin gtt per RN.   Will increase gtt conservatively given procedures today and recent therapeutic levels at 900 units/hr.   Goal of Therapy:  Heparin level 0.3-0.5 units/ml Monitor platelets by anticoagulation protocol: Yes   Plan:  Increase heparin gtt to 900 units/hr Heparin level in 6 hrs Daily heparin level and CBC Monitor s/s bleeding F/u pre/post-procedures   Lavonda Jumbo, PharmD Clinical Pharmacist 07/11/17 5:15 AM

## 2017-07-11 NOTE — Interval H&P Note (Signed)
History and Physical Interval Note:  07/11/2017 10:11 AM  Anna Hayes  has presented today for surgery, with the diagnosis of bacteremia  The various methods of treatment have been discussed with the patient and family. After consideration of risks, benefits and other options for treatment, the patient has consented to  Procedure(s): TRANSESOPHAGEAL ECHOCARDIOGRAM (TEE) (N/A) as a surgical intervention .  The patient's history has been reviewed, patient examined, no change in status, stable for surgery.  I have reviewed the patient's chart and labs.  Questions were answered to the patient's satisfaction.     Mertie Moores

## 2017-07-11 NOTE — Progress Notes (Signed)
Discussed with staff RN and RN CM. Noted TEE complete. A surgical procedure is scheduled with Dr. Posey Pronto for 430 today. Also questions removal of Amputation Wound VAC and IR drain. I will follow up tomorrow to assist with planning inpt rehab admission pending medical readiness. [phone]

## 2017-07-11 NOTE — Brief Op Note (Signed)
06/27/2017 - 07/11/2017  5:00 PM  PATIENT:  Anna Hayes  66 y.o. 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  PROCEDURE:  Procedure(s): REPAIR OF COMPLEX TRACTION RETINAL DETACHMENT WITH ENDO LASER AND ANTIFUNGAL INJECTION (Right)  SURGEON:  Surgeon(s) and Role:    Jalene Mullet, MD - Primary  PHYSICIAN ASSISTANT:   ASSISTANTS: none   ANESTHESIA:   local and MAC  EBL:  0 mL   BLOOD ADMINISTERED:none  DRAINS: none   LOCAL MEDICATIONS USED:  MARCAINE    and LIDOCAINE   SPECIMEN:  No Specimen  DISPOSITION OF SPECIMEN:  Microbiology - Vitreous for fungal culture  COUNTS:  YES  TOURNIQUET:  * No tourniquets in log *  DICTATION: .Note written in EPIC  PLAN OF CARE: return to inpatient floor  PATIENT DISPOSITION:  PACU - hemodynamically stable.   Delay start of Pharmacological VTE agent (>24hrs) due to surgical blood loss or risk of bleeding: no

## 2017-07-11 NOTE — Anesthesia Postprocedure Evaluation (Signed)
Anesthesia Post Note  Patient: Cambre Matson  Procedure(s) Performed: REPAIR OF COMPLEX TRACTION RETINAL DETACHMENT WITH ENDO LASER AND ANTIFUNGAL INJECTION (Right Eye)     Patient location during evaluation: PACU Anesthesia Type: MAC Level of consciousness: awake, awake and alert and oriented Pain management: pain level controlled Vital Signs Assessment: post-procedure vital signs reviewed and stable Respiratory status: spontaneous breathing, nonlabored ventilation and respiratory function stable Cardiovascular status: blood pressure returned to baseline Postop Assessment: no headache Anesthetic complications: no    Last Vitals:  Vitals:   07/11/17 1736 07/11/17 1752  BP: 117/70 117/76  Pulse: 92 93  Resp: 14 18  Temp: (!) 36.2 C   SpO2: 100% 99%    Last Pain:  Vitals:   07/11/17 1736  TempSrc:   PainSc: 0-No pain                 Marlane Hirschmann COKER

## 2017-07-11 NOTE — H&P (Signed)
  Date of examination:  07/09/17  Indication for surgery: endophthalmitis and tractional retinal detachment right eye  Pertinent past medical history:  Past Medical History:  Diagnosis Date  . Frequent headaches   . Hypertension   . Muscle pain   . Palpitations   . Sinus problem   . Stroke [facility])     Pertinent ocular history:  Fungemia with endophthalmitis   Pertinent family history: No family history on file.  General:  Healthy appearing patient in no distress.    Eyes:    Acuity OD CF  OS 20/60    External: Within normal limits     Anterior segment: Within normal limits      Fundus: hazy view OD, subretinal lesions OS temporal to fovea   B-scan - TRD noted    Impression:  Endophthalmitis and tractional retinal detachment right eye  Plan: Tractional retinal detachment repair with injection of intravitreal Voriconizole.  Royston Cowper

## 2017-07-11 NOTE — Progress Notes (Signed)
PROGRESS NOTE  Anna Hayes YDX:[id number] DOB: 06-18-1951 DOA: 06/27/2017 PCP: Rochel Brome, MD  HPI/Recap of past 51 hours: 65 year old woman presented with abdominal pain a 66 year old womannd was admitted to [facility] 9/22. Found to have small bowel obstruction. Underwent expiratory laparotomy with lysis of adhesions and release of small bowel obstruction, repair of small bowel perforation. Subsequently had fevers, leukocytosis and CT abdomen and pelvis 9/29 revealed pelvic abscess and underwent successful placement of percutaneous drain October 1. Also noted to have fungemia on 1/2 blood culture 9/27 and started on antifungal therapy. She subsequently developed left lower stomach pain and discomfort October 1 and underwent CT angiogram which revealed bilateral arterial blockage both lower extremities, was started on heparin and emergently transferred to [facility] where she underwent thrombectomy and fasciotomy October 2.  Since then, vascular surgery has felt that her left foot is nonviable and left BKA has been recommended, but patient is having difficulty making that decision. Palliative medicine was consulted and patient met with them and family today for goals of care discussion.  After this meeting, patient has opted for surgery.  Subjective;  Denies abdominal pain. Came from TEE, no complications reported.   Assessment/Plan: Critical left leg ischemia, status post bilateral femoral embolectomy, angioplasty left posterior tibial artery, fasciotomy left leg 10/2. -VVS managing. Left foot nonviable. Patient now amenable to left BKA. Underwent surgery on 10/10 -repeat cbc tonight. -Heparin per vascular. Will follow vascular recommendation for oral anticoagulation.   Fungemia with Endophthalmitis; OD--OS. Left Eye with subretinal fungal infection in the temporal macula; -spoke with Dr Posey Pronto. He recommended MRI brain and orbit with and without contrast.  -he will discussed with ID, regarding  resuming IV antifungal.  -S/P Intra-vitreal  antifungal injection 10-14. -IV fluconazole.  -Follow ID recommendation for tx.  -TEE negative for vegetation.  -for tractional retinal detachment repair with injection of intravitreal voriconazole.   Abdominal abscess, Status post laparotomy, lysis of adhesions and repair of perforated bowel 9/24 at [facility]. -per general surgery. Wound VAC. - status post percutaneous drain placement October 1.  -WBC slowly trending down. Continue abx as per ID  -CT done 10/8 notes persistent fluid collections.  -CT abdomen 10-15. Pelvis and lower abdominal rim enhancing fluid collections compatible with abscess are stable to decreased in size. The abscess in the posterior pelvis containing the drainage catheter is decreased in size. No new abscess identified. Increase in size of nonenhancing fluid collection overlying the left femoral vessels, probably postoperative seroma. Surgery recommend to continue with wound vac until follow up with outpatient surgeon.  Per ID; continue with antibacterial therapy until abscess is resolved or she has had at least a month of therapy.  Antibiotics change to cefopoxime and flagyl.  IR managing the drain.   Fungemia Candida tropicalis  -now with endophthalmitis.  -On IV Eraxis  -Plan for TEE tomorrow.  repeated blood culture 10-03 and 10-13 negative.    Hypoalbuminemia.  -diet per surgery .  -supplements per dietician   anemia, likely multifactorial including perioperative blood loss. S/p 2 units PRBC 10/2. -Hgb stable.   Start nu iron   Obesity unspecified: meets criteria BMI greater than 30  Status post AKI: Now resolved  PMH stroke with left upper and left lower extremity weakness, left facial droop  Code Status: Full code   Family Communication: patient.   Disposition Plan: CIR consulted. Needs TEE   Consultants: Vascular surgery General surgery Infectious disease Interventional  radiology Physical medicine and rehabilitation   Procedures:  9/24: Exploratory laparoscopy with lysis of adhesions and release of small bowel obstruction, repair of small bowel perforation  10/2: Transfused 2 units packed red blood cells  10/1: Percutaneous drain placement and right buttock for pelvic abscess  10/2: Bilateral femoral popliteal embolectomy, left posterior tibial artery patch angioplasty and left lower leg fasciotomy   10/10: Planned amputation  Antimicrobials: Anidulafungin 10/2 >>10/3 Fluconazole 10/3 >> 10/17 Levaquin 10/2 >>ceftriaxone   Metronidazole 10/2 >>   DVT prophylaxis:  Heparin   Objective: Vitals:   07/11/17 1042 07/11/17 1050 07/11/17 1120 07/11/17 1200  BP: 114/62 113/63 112/69 107/68  Pulse: 100 96 96 88  Resp: '14 17 16 20  ' Temp:  99.1 F (37.3 C) 98.5 F (36.9 C)   TempSrc:  Oral Oral   SpO2: 98% 98% 98% 98%  Weight:      Height:        Intake/Output Summary (Last 24 hours) at 07/11/17 1459 Last data filed at 07/11/17 0909  Gross per 24 hour  Intake           302.63 ml  Output             1750 ml  Net         -1447.37 ml   Filed Weights   07/10/17 0400 07/11/17 0400 07/11/17 0917  Weight: 65.7 kg (144 lb 12.8 oz) 64.3 kg (141 lb 12.8 oz) 64 kg (141 lb)    Exam:   General: NAD  HEENT; MMM  Neck: supple, no JVD  Cardiovascular: S 1, S 2 RRR  Respiratory: CTA  Abdomen: soft, NT, ND, wound vac in place.   Musculoskeletal; No cyanosis.   Skin: Left AKA.      Data Reviewed: CBC:  Recent Labs Lab 07/07/17 0905 07/08/17 0328 07/09/17 0230 07/10/17 0241 07/11/17 0341  WBC 16.4* 12.8* 11.4* 10.9* 9.8  HGB 8.0* 8.2* 7.9* 8.2* 8.6*  HCT 25.4* 26.8* 25.7* 26.3* 27.1*  MCV 78.9 78.8 78.8 78.7 79.5  PLT 394 404* 426* 436* 468*   Basic Metabolic Panel:  Recent Labs Lab 07/06/17 0808 07/07/17 0905 07/08/17 0328 07/10/17 1459 07/11/17 0341  NA 136 138 137 136 136  K 4.1 3.5 3.7 3.5 3.5  CL 101 105  100* 102 103  CO2 '27 24 28 25 26  ' GLUCOSE 111* 84 89 112* 101*  BUN '7 7 8 ' <5* <5*  CREATININE 0.68 0.72 0.73 0.77 0.66  CALCIUM 7.6* 7.7* 7.9* 8.0* 8.2*   GFR: Estimated Creatinine Clearance: 59.2 mL/min (by C-G formula based on SCr of 0.66 mg/dL). Liver Function Tests: No results for input(s): AST, ALT, ALKPHOS, BILITOT, PROT, ALBUMIN in the last 168 hours. No results for input(s): LIPASE, AMYLASE in the last 168 hours. No results for input(s): AMMONIA in the last 168 hours. Coagulation Profile:  Recent Labs Lab 07/05/17 0226 07/11/17 0341  INR 1.31 1.14   Cardiac Enzymes: No results for input(s): CKTOTAL, CKMB, CKMBINDEX, TROPONINI in the last 168 hours. BNP (last 3 results) No results for input(s): PROBNP in the last 8760 hours. HbA1C: No results for input(s): HGBA1C in the last 72 hours. CBG: No results for input(s): GLUCAP in the last 168 hours. Lipid Profile: No results for input(s): CHOL, HDL, LDLCALC, TRIG, CHOLHDL, LDLDIRECT in the last 72 hours. Thyroid Function Tests: No results for input(s): TSH, T4TOTAL, FREET4, T3FREE, THYROIDAB in the last 72 hours. Anemia Panel: No results for input(s): VITAMINB12, FOLATE, FERRITIN, TIBC, IRON, RETICCTPCT in the last 72 hours. Urine analysis: No  results found for: COLORURINE, APPEARANCEUR, Dana, Start, GLUCOSEU, HGBUR, BILIRUBINUR, KETONESUR, PROTEINUR, UROBILINOGEN, NITRITE, LEUKOCYTESUR Sepsis Labs: '@LABRCNTIP' (procalcitonin:4,lacticidven:4)  ) Recent Results (from the past 240 hour(s))  MRSA PCR Screening     Status: None   Collection Time: 07/05/17  5:58 AM  Result Value Ref Range Status   MRSA by PCR NEGATIVE NEGATIVE Final    Comment:        The GeneXpert MRSA Assay (FDA approved for NASAL specimens only), is one component of a comprehensive MRSA colonization surveillance program. It is not intended to diagnose MRSA infection nor to guide or monitor treatment for MRSA infections.   Culture, blood  (Routine X 2) w Reflex to ID Panel     Status: None (Preliminary result)   Collection Time: 07/08/17 12:03 PM  Result Value Ref Range Status   Specimen Description BLOOD RIGHT HAND  Final   Special Requests   Final    BOTTLES DRAWN AEROBIC AND ANAEROBIC Blood Culture adequate volume   Culture NO GROWTH 2 DAYS  Final   Report Status PENDING  Incomplete  Culture, blood (Routine X 2) w Reflex to ID Panel     Status: None (Preliminary result)   Collection Time: 07/08/17 12:04 PM  Result Value Ref Range Status   Specimen Description BLOOD LEFT HAND  Final   Special Requests   Final    BOTTLES DRAWN AEROBIC AND ANAEROBIC Blood Culture results may not be optimal due to an excessive volume of blood received in culture bottles   Culture NO GROWTH 2 DAYS  Final   Report Status PENDING  Incomplete      Studies: No results found.  Scheduled Meds: . [MAR Hold] amLODipine  5 mg Oral Daily  . [MAR Hold] cefpodoxime  200 mg Oral Q12H  . [MAR Hold] docusate sodium  100 mg Oral Daily  . [MAR Hold] feeding supplement (ENSURE ENLIVE)  237 mL Oral BID BM  . [MAR Hold] gabapentin  300 mg Oral BID  . [MAR Hold] iron polysaccharides  150 mg Oral Daily  . [MAR Hold] metroNIDAZOLE  500 mg Oral Q8H  . [MAR Hold] multivitamin with minerals  1 tablet Oral Daily  . [MAR Hold] ofloxacin  1 drop Both Eyes QID  . [MAR Hold] pantoprazole  40 mg Oral Daily  . [MAR Hold] voriconazole  500 mcg Intravitreal To OR    Continuous Infusions: . [MAR Hold] sodium chloride    . [MAR Hold] sodium chloride    . [MAR Hold] fluconazole (DIFLUCAN) IV 800 mg (07/10/17 1415)  . heparin Stopped (07/11/17 0930)  . lactated ringers 10 mL/hr at 07/05/17 1005     LOS: 14 days     Elmarie Shiley, MD [facility] Pager [phone]  If 7PM-7AM, please contact night-coverage [email or web address] Password [email or web address] 07/11/2017, 2:59 PM

## 2017-07-11 NOTE — Discharge Instructions (Signed)
FOR 2 DAYS DO NOT SLEEP ON BACK, THE EYE PRESSURE CAN GO UP AND CAUSE VISION LOSS   SLEEP ON SIDE WITH NOSE TO PILLOW  DURING DAY KEEP UPRIGHT

## 2017-07-11 NOTE — Anesthesia Preprocedure Evaluation (Addendum)
Anesthesia Evaluation  Patient identified by MRN, date of birth, ID band Patient awake    Reviewed: Allergy & Precautions, NPO status , Patient's Chart, lab work & pertinent test results, reviewed documented beta blocker date and time   Airway Mallampati: II  TM Distance: >3 FB Neck ROM: Full    Dental  (+) Poor Dentition, Upper Dentures   Pulmonary COPD, Current Smoker,    Pulmonary exam normal breath sounds clear to auscultation       Cardiovascular hypertension, Pt. on medications and Pt. on home beta blockers + Peripheral Vascular Disease  Normal cardiovascular exam Rhythm:Regular Rate:Normal  Hx/o ischemic left lower ext.   Neuro/Psych  Headaches, Hx/o Fungal endophthalmitis Retinal detachment OD Weakness left side  Neuromuscular disease CVA, Residual Symptoms negative psych ROS   GI/Hepatic Neg liver ROS, GERD  Controlled and Medicated,  Endo/Other  Hyperlipidemia  Renal/GU negative Renal ROS     Musculoskeletal S/P Left AKA   Abdominal   Peds  Hematology  (+) anemia , Hx/o candidemia   Anesthesia Other Findings   Reproductive/Obstetrics Pelvic abscess                          Anesthesia Physical Anesthesia Plan  ASA: III  Anesthesia Plan: MAC   Post-op Pain Management:    Induction: Intravenous  PONV Risk Score and Plan: 4 or greater and Ondansetron, Dexamethasone, Propofol infusion and Treatment may vary due to age or medical condition  Airway Management Planned: Simple Face Mask and Nasal Cannula  Additional Equipment:   Intra-op Plan:   Post-operative Plan:   Informed Consent: I have reviewed the patients History and Physical, chart, labs and discussed the procedure including the risks, benefits and alternatives for the proposed anesthesia with the patient or authorized representative who has indicated his/her understanding and acceptance.   Dental advisory  given  Plan Discussed with: CRNA, Anesthesiologist and Surgeon  Anesthesia Plan Comments:       Anesthesia Quick Evaluation

## 2017-07-11 NOTE — Op Note (Signed)
Shirel Mallis Mcglothlin 07/11/2017 Diagnosis: tractional retinal detachment and endophthalmitis right eye  Procedure: [place] Vitrectomy, Membrane Peeling, Endolaser, Fluid Gas Exchange and membranectomy with tractional retinal detachment repair and tap for culture Operative Eye:  right eye  Surgeon: Royston Cowper Estimated Blood Loss: minimal Specimens for Pathology:  None Complications: none    The  patient was prepped and draped in the usual fashion for ocular surgery on the  right eye .  A lid speculum was placed.  Infusion line and trocar was placed at the 8 o'clock position approximately 3.5 mm from the surgical limbus.   The infusion line was allowed to run and then clamped when placed at the cannula opening. The line was inserted and secured to the drape with an adhesive strip.   Active trocars/cannula were placed at the 10 and 2 o'clock positions approximately 3.5 mm from the surgical limbus. The cannula was visualized in the vitreous cavity.  The light pipe and vitreous cutter were inserted into the vitreous cavity and a core vitrectomy was performed.  There was notable fibrosis of the vitreous with traction at the disc and nasal to the disc in the area of the tractional retinal detachment.  Care taken to remove the vitreous adhesions and truncate the fibrotic tissue.  After segmentation and delamination of the fibrotic tissue, the membrane overlying the nasal tractional detachment was elevated with ILM forceps and removed with the vitrector.    Endolaser was applied 360 degrees to the far periphery and in areas of ischemic retina.  There were notable sclerotic vessels in the temporal retina as well as tortuous and attenuated vasculature throughout the retina.  Vitreous tap was performed and sent for fungal cultures.  A partial air fluid exchange was performed and the trocars were sequentially removed and noted to be airtight.  100 ug of Voriconizole was injected in the posterior  space.    Subconjunctival injection of Vancomycin and Dexamethasone 4mg /42ml was placed.   The speculum and drapes were removed and the eye was patched with Polymixin/Bacitracin ophthalmic ointment. An eye shield was placed and the patient was transferred alert and conversant with stable vital signs to the post operative recovery area.  The patient tolerated the procedure well and no complications were noted.  Royston Cowper MD

## 2017-07-11 NOTE — Progress Notes (Signed)
  Echocardiogram Echocardiogram Transesophageal has been performed.  Anna Hayes 07/11/2017, 2:00 PM

## 2017-07-11 NOTE — CV Procedure (Signed)
    Transesophageal Echocardiogram Note  Anna Hayes [id number] 06/26/1951  Procedure: Transesophageal Echocardiogram Indications: bacteremia   Procedure Details Consent: Obtained Time Out: Verified patient identification, verified procedure, site/side was marked, verified correct patient position, special equipment/implants available, Radiology Safety Procedures followed,  medications/allergies/relevent history reviewed, required imaging and test results available.  Performed  Medications:  During this procedure the patient is administered a total of Versed 4  mg and Fentanyl 50 mcg  to achieve and maintain moderate conscious sedation.  The patient's heart rate, blood pressure, and oxygen saturation are monitored continuously during the procedure. The period of conscious sedation is 30 minutes, of which I was present face-to-face 100% of this time.  Left Ventrical:  Normal LV function   Mitral Valve: normal   Aortic Valve: normal   Tricuspid Valve: trace TR   Pulmonic Valve: poorly visualized , no obvious PI  Left Atrium/ Left atrial appendage: no thrombi   Atrial septum: no obvious ASD or PFO  Aorta: mild aortic plaque    Complications: No apparent complications Patient did tolerate procedure well.   Thayer Headings, Brooke Bonito., MD, [facility] 07/11/2017, 10:30 AM

## 2017-07-12 ENCOUNTER — Encounter (HOSPITAL_COMMUNITY): Payer: Self-pay | Admitting: Ophthalmology

## 2017-07-12 ENCOUNTER — Inpatient Hospital Stay (HOSPITAL_COMMUNITY)
Admission: RE | Admit: 2017-07-12 | Discharge: 2017-07-28 | DRG: 559 | Disposition: A | Discharge status: Home Health | Payer: Medicare Other | Source: Intra-hospital | Attending: Physical Medicine & Rehabilitation | Admitting: Physical Medicine & Rehabilitation

## 2017-07-12 DIAGNOSIS — K651 Peritoneal abscess: Secondary | ICD-10-CM | POA: Diagnosis present

## 2017-07-12 DIAGNOSIS — I69354 Hemiplegia and hemiparesis following cerebral infarction affecting left non-dominant side: Secondary | ICD-10-CM

## 2017-07-12 DIAGNOSIS — M7989 Other specified soft tissue disorders: Secondary | ICD-10-CM

## 2017-07-12 DIAGNOSIS — M792 Neuralgia and neuritis, unspecified: Secondary | ICD-10-CM

## 2017-07-12 DIAGNOSIS — Z89612 Acquired absence of left leg above knee: Secondary | ICD-10-CM

## 2017-07-12 DIAGNOSIS — H44001 Unspecified purulent endophthalmitis, right eye: Secondary | ICD-10-CM | POA: Diagnosis present

## 2017-07-12 DIAGNOSIS — Y838 Other surgical procedures as the cause of abnormal reaction of the patient, or of later complication, without mention of misadventure at the time of the procedure: Secondary | ICD-10-CM

## 2017-07-12 DIAGNOSIS — I10 Essential (primary) hypertension: Secondary | ICD-10-CM | POA: Diagnosis present

## 2017-07-12 DIAGNOSIS — S78112A Complete traumatic amputation at level between left hip and knee, initial encounter: Secondary | ICD-10-CM | POA: Diagnosis not present

## 2017-07-12 DIAGNOSIS — B49 Unspecified mycosis: Secondary | ICD-10-CM

## 2017-07-12 DIAGNOSIS — H4419 Other endophthalmitis: Secondary | ICD-10-CM

## 2017-07-12 DIAGNOSIS — F17210 Nicotine dependence, cigarettes, uncomplicated: Secondary | ICD-10-CM | POA: Diagnosis present

## 2017-07-12 DIAGNOSIS — R509 Fever, unspecified: Secondary | ICD-10-CM | POA: Diagnosis not present

## 2017-07-12 DIAGNOSIS — K5900 Constipation, unspecified: Secondary | ICD-10-CM | POA: Diagnosis present

## 2017-07-12 DIAGNOSIS — Z4781 Encounter for orthopedic aftercare following surgical amputation: Secondary | ICD-10-CM | POA: Diagnosis present

## 2017-07-12 DIAGNOSIS — Z79899 Other long term (current) drug therapy: Secondary | ICD-10-CM | POA: Diagnosis not present

## 2017-07-12 DIAGNOSIS — E876 Hypokalemia: Secondary | ICD-10-CM | POA: Diagnosis present

## 2017-07-12 DIAGNOSIS — D62 Acute posthemorrhagic anemia: Secondary | ICD-10-CM | POA: Diagnosis present

## 2017-07-12 DIAGNOSIS — N739 Female pelvic inflammatory disease, unspecified: Secondary | ICD-10-CM

## 2017-07-12 DIAGNOSIS — Z880 Allergy status to penicillin: Secondary | ICD-10-CM | POA: Diagnosis not present

## 2017-07-12 DIAGNOSIS — R000 Tachycardia, unspecified: Secondary | ICD-10-CM | POA: Diagnosis not present

## 2017-07-12 DIAGNOSIS — M79661 Pain in right lower leg: Secondary | ICD-10-CM

## 2017-07-12 DIAGNOSIS — Z91040 Latex allergy status: Secondary | ICD-10-CM | POA: Diagnosis not present

## 2017-07-12 DIAGNOSIS — Z8719 Personal history of other diseases of the digestive system: Secondary | ICD-10-CM

## 2017-07-12 DIAGNOSIS — B3789 Other sites of candidiasis: Secondary | ICD-10-CM

## 2017-07-12 DIAGNOSIS — M79609 Pain in unspecified limb: Secondary | ICD-10-CM | POA: Diagnosis not present

## 2017-07-12 DIAGNOSIS — S78119A Complete traumatic amputation at level between unspecified hip and knee, initial encounter: Secondary | ICD-10-CM

## 2017-07-12 DIAGNOSIS — K5901 Slow transit constipation: Secondary | ICD-10-CM

## 2017-07-12 DIAGNOSIS — T8189XA Other complications of procedures, not elsewhere classified, initial encounter: Secondary | ICD-10-CM | POA: Diagnosis not present

## 2017-07-12 DIAGNOSIS — T8149XA Infection following a procedure, other surgical site, initial encounter: Secondary | ICD-10-CM

## 2017-07-12 LAB — CBC
HCT: 27.5 % — ABNORMAL LOW (ref 36.0–46.0)
HEMOGLOBIN: 8.6 g/dL — AB (ref 12.0–15.0)
MCH: 24.8 pg — AB (ref 26.0–34.0)
MCHC: 31.3 g/dL (ref 30.0–36.0)
MCV: 79.3 fL (ref 78.0–100.0)
Platelets: 370 10*3/uL (ref 150–400)
RBC: 3.47 MIL/uL — ABNORMAL LOW (ref 3.87–5.11)
RDW: 21 % — AB (ref 11.5–15.5)
WBC: 8.2 10*3/uL (ref 4.0–10.5)

## 2017-07-12 LAB — HEPARIN LEVEL (UNFRACTIONATED)

## 2017-07-12 MED ORDER — AMLODIPINE BESYLATE 5 MG PO TABS
5.0000 mg | ORAL_TABLET | Freq: Every day | ORAL | Status: DC
  Administered 2017-07-13 – 2017-07-28 (×15): 5 mg via ORAL
  Filled 2017-07-12 (×16): qty 1

## 2017-07-12 MED ORDER — OFLOXACIN 0.3 % OP SOLN
1.0000 [drp] | Freq: Four times a day (QID) | OPHTHALMIC | Status: AC
  Administered 2017-07-12: 1 [drp] via OPHTHALMIC
  Filled 2017-07-12: qty 5

## 2017-07-12 MED ORDER — METRONIDAZOLE 500 MG PO TABS: 500.0000 mg | ORAL_TABLET | Freq: Three times a day (TID) | ORAL | 0 refills | Status: DC

## 2017-07-12 MED ORDER — RIVAROXABAN 20 MG PO TABS
20.0000 mg | ORAL_TABLET | Freq: Every day | ORAL | Status: DC
  Administered 2017-07-13 – 2017-07-28 (×16): 20 mg via ORAL
  Filled 2017-07-12 (×17): qty 1

## 2017-07-12 MED ORDER — ACETAMINOPHEN 325 MG PO TABS
325.0000 mg | ORAL_TABLET | ORAL | Status: DC | PRN
  Administered 2017-07-16 – 2017-07-28 (×6): 650 mg via ORAL
  Filled 2017-07-12 (×8): qty 2

## 2017-07-12 MED ORDER — ACETAMINOPHEN 650 MG RE SUPP: 325.0000 mg | RECTAL | Status: DC | PRN

## 2017-07-12 MED ORDER — METRONIDAZOLE 500 MG PO TABS
500.0000 mg | ORAL_TABLET | Freq: Three times a day (TID) | ORAL | Status: DC
  Administered 2017-07-12 – 2017-07-28 (×48): 500 mg via ORAL
  Filled 2017-07-12 (×48): qty 1

## 2017-07-12 MED ORDER — BOOST PLUS PO LIQD
237.0000 mL | Freq: Two times a day (BID) | ORAL | Status: DC
Start: 2017-07-12 — End: 2017-07-12
  Filled 2017-07-12 (×3): qty 237

## 2017-07-12 MED ORDER — DOCUSATE SODIUM 100 MG PO CAPS
100.0000 mg | ORAL_CAPSULE | Freq: Every day | ORAL | Status: DC
  Administered 2017-07-13 – 2017-07-14 (×2): 100 mg via ORAL
  Filled 2017-07-12 (×2): qty 1

## 2017-07-12 MED ORDER — ONDANSETRON HCL 4 MG PO TABS
4.0000 mg | ORAL_TABLET | Freq: Four times a day (QID) | ORAL | Status: DC | PRN
  Administered 2017-07-13 – 2017-07-28 (×14): 4 mg via ORAL
  Filled 2017-07-12 (×14): qty 1

## 2017-07-12 MED ORDER — ONDANSETRON HCL 4 MG/2ML IJ SOLN: 4.0000 mg | Freq: Four times a day (QID) | INTRAMUSCULAR | Status: DC | PRN

## 2017-07-12 MED ORDER — OXYCODONE-ACETAMINOPHEN 5-325 MG PO TABS
1.0000 | ORAL_TABLET | ORAL | Status: DC | PRN
  Administered 2017-07-12: 2 via ORAL
  Administered 2017-07-13: 1 via ORAL
  Administered 2017-07-13 – 2017-07-15 (×6): 2 via ORAL
  Administered 2017-07-15 (×2): 1 via ORAL
  Administered 2017-07-16 – 2017-07-20 (×14): 2 via ORAL
  Administered 2017-07-20 – 2017-07-21 (×3): 1 via ORAL
  Administered 2017-07-21 – 2017-07-23 (×4): 2 via ORAL
  Administered 2017-07-23: 1 via ORAL
  Administered 2017-07-23 – 2017-07-24 (×2): 2 via ORAL
  Administered 2017-07-24: 1 via ORAL
  Administered 2017-07-25 (×2): 2 via ORAL
  Administered 2017-07-26: 1 via ORAL
  Administered 2017-07-26: 2 via ORAL
  Administered 2017-07-26: 1 via ORAL
  Administered 2017-07-27 – 2017-07-28 (×3): 2 via ORAL
  Filled 2017-07-12 (×10): qty 2
  Filled 2017-07-12: qty 1
  Filled 2017-07-12 (×2): qty 2
  Filled 2017-07-12: qty 1
  Filled 2017-07-12 (×5): qty 2
  Filled 2017-07-12: qty 1
  Filled 2017-07-12: qty 2
  Filled 2017-07-12: qty 1
  Filled 2017-07-12: qty 2
  Filled 2017-07-12: qty 1
  Filled 2017-07-12: qty 2
  Filled 2017-07-12: qty 1
  Filled 2017-07-12: qty 2
  Filled 2017-07-12: qty 1
  Filled 2017-07-12 (×8): qty 2
  Filled 2017-07-12: qty 1
  Filled 2017-07-12 (×3): qty 2
  Filled 2017-07-12 (×2): qty 1
  Filled 2017-07-12: qty 2
  Filled 2017-07-12: qty 1

## 2017-07-12 MED ORDER — ADULT MULTIVITAMIN W/MINERALS CH
1.0000 | ORAL_TABLET | Freq: Every day | ORAL | Status: DC
  Administered 2017-07-13 – 2017-07-28 (×14): 1 via ORAL
  Filled 2017-07-12 (×15): qty 1

## 2017-07-12 MED ORDER — POLYSACCHARIDE IRON COMPLEX 150 MG PO CAPS
150.0000 mg | ORAL_CAPSULE | Freq: Every day | ORAL | Status: DC
  Administered 2017-07-13 – 2017-07-28 (×16): 150 mg via ORAL
  Filled 2017-07-12 (×16): qty 1

## 2017-07-12 MED ORDER — CEFPODOXIME PROXETIL 200 MG PO TABS
200.0000 mg | ORAL_TABLET | Freq: Two times a day (BID) | ORAL | Status: DC
  Administered 2017-07-12 – 2017-07-28 (×32): 200 mg via ORAL
  Filled 2017-07-12 (×33): qty 1

## 2017-07-12 MED ORDER — FLUCONAZOLE IN SODIUM CHLORIDE 400-0.9 MG/200ML-% IV SOLN
800.0000 mg | INTRAVENOUS | Status: DC
  Administered 2017-07-13: 800 mg via INTRAVENOUS
  Filled 2017-07-12 (×4): qty 400

## 2017-07-12 MED ORDER — CEFPODOXIME PROXETIL 200 MG PO TABS: 200.0000 mg | ORAL_TABLET | Freq: Two times a day (BID) | ORAL | 0 refills | Status: DC

## 2017-07-12 MED ORDER — GABAPENTIN 300 MG PO CAPS
300.0000 mg | ORAL_CAPSULE | Freq: Two times a day (BID) | ORAL | Status: DC
  Administered 2017-07-12 – 2017-07-28 (×32): 300 mg via ORAL
  Filled 2017-07-12 (×32): qty 1

## 2017-07-12 MED ORDER — HEPARIN (PORCINE) IN NACL 100-0.45 UNIT/ML-% IJ SOLN
900.0000 [IU]/h | INTRAMUSCULAR | Status: DC
  Administered 2017-07-12: 900 [IU]/h via INTRAVENOUS
  Filled 2017-07-12: qty 250

## 2017-07-12 MED ORDER — RIVAROXABAN 20 MG PO TABS
20.0000 mg | ORAL_TABLET | Freq: Every day | ORAL | Status: DC
  Administered 2017-07-12: 20 mg via ORAL
  Filled 2017-07-12: qty 1

## 2017-07-12 MED ORDER — RIVAROXABAN 20 MG PO TABS: 20.0000 mg | ORAL_TABLET | Freq: Every day | ORAL | 0 refills | Status: DC

## 2017-07-12 MED ORDER — SORBITOL 70 % SOLN: 30.0000 mL | Freq: Every day | Status: DC | PRN

## 2017-07-12 MED ORDER — PANTOPRAZOLE SODIUM 40 MG PO TBEC
40.0000 mg | DELAYED_RELEASE_TABLET | Freq: Every day | ORAL | Status: DC
  Administered 2017-07-13 – 2017-07-28 (×16): 40 mg via ORAL
  Filled 2017-07-12 (×16): qty 1

## 2017-07-12 NOTE — Progress Notes (Signed)
Jamse Arn, MD Physician Signed Physical Medicine and Rehabilitation  PMR Pre-admission Date of Service: 07/10/2017 3:54 PM  Related encounter: Admission (Current) from 06/27/2017 in [facility] 4E CV SURGICAL PROGRESSIVE CARE       [] Hide copied text PMR Admission Coordinator Pre-Admission Assessment  Patient: Anna Hayes is an 66 y.o., female MRN: [id number] DOB: Feb 23, 1951 Height: 5\' 2"  (157.5 cm) Weight: 64 kg (141 lb)                                                                                                                                                  Insurance Information HMO: yes    PPO:      PCP:      IPA:      80/20:      OTHER: Medicare advantage plan PRIMARY: [place] [employer]      Policy#: [id number]      Subscriber: pt CM Name: Fulton Mole      Phone#: [phone]     Fax#: [phone] Pre-Cert#: [id number] approved for 7 days with updates due to Vevelyn Royals phone [phone] fax [phone]      Employer: retired Benefits:  Phone #: [phone]     Name: 07/07/2017 Eff. Date: 09/26/2016     Deduct: none      Out of Pocket Max: $4400      Life Max: none CIR: $345 copay per day days 1 through 5 then insurance covers 100%      SNF: no copay days 1 through 20; $160 copay per day days 21-48; no copay days 49-100 Outpatient: $40 copay per visit     Co-Pay: visits per medical neccessity Home Health: 100%      Co-Pay: visits per medical neccessity DME: 80%     Co-Pay: 20% Providers: in network  SECONDARY: none      Medicaid Application Date:       Case Manager:  Disability Application Date:       Case Worker:   Emergency Contact Information        Contact Information    Name Relation Home Work Ar[place]o Daughter   [phone]     Current Medical History  Patient Admitting Diagnosis: Debility  History of Present Illness: HPI: Clovis Warwick [facility] a 65 y.o.femalewith history of palpitations, PAD, tobacco use, CVA with  residual Left HP.  Patient Burnard Hawthorne was admitted to [facility] with SBO who underwent lysis of adhesions and repair of perforated bowel 06/19/17. History taken from chart review. Hospital course complicated by fevers with leucocytosis due to s/p drain placement 10/1, fungemia and development of ischemic LLE due to occlusion of left popliteal and femoral arteries as well as thrombus in right femoral artery. She was started on IV heparin and transferred to [facility] on 10/2 due as patient with significant pain with motor and  sensory deficits. She was taken to OR for BLE thrombectomy with left 4 compartment fasciotomy by Dr. Donavan Burnet ischemic changes of left lower extremity and underwent left AKA 07/05/2017 per Dr. 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 Patient continues on heparin therapy already planned for oral anticoagulation.Therapy evaluations done today revealing difficulty WB on LE with instability RLE and pain affecting mobility.   Past Medical History      Past Medical History:  Diagnosis Date  . Frequent headaches   . Hypertension   . Muscle pain   . Palpitations   . Sinus problem   . Stroke [facility])     Family History  family history is not on file.  Prior Rehab/Hospitalizations:  Has the patient had major surgery during 100 days prior to admission? No  Current Medications   Current Facility-Administered Medications:    .  0.9 %  sodium chloride infusion, , Intravenous, Once, Inda Coke, CRNA .  0.9 %  sodium chloride infusion, 500 mL, Intravenous, Once PRN, Serafina Mitchell, MD .  0.9 %  sodium chloride infusion, , Intravenous, Continuous, Josephine Igo, MD .  acetaminophen (TYLENOL) tablet 325-650 mg, 325-650 mg, Oral, Q4H PRN, 650 mg at 07/11/17 0558 **OR** acetaminophen (TYLENOL) suppository 325-650 mg, 325-650 mg, Rectal, Q4H PRN, Serafina Mitchell, MD .  alum & mag hydroxide-simeth (MAALOX/MYLANTA) 200-200-20 MG/5ML suspension 15-30 mL, 15-30 mL, Oral, Q2H PRN, Serafina Mitchell, MD .  amLODipine (NORVASC) tablet 5 mg, 5 mg, Oral, Daily, Serafina Mitchell, MD, 5 mg at 07/12/17 1039 .  cefpodoxime Bennie Pierini) tablet 200 mg, 200 mg, Oral, Q12H, Tommy Medal, Lavell Islam, MD, 200 mg at 07/11/17 2112 .  docusate sodium (COLACE) capsule 100 mg, 100 mg, Oral, Daily, Serafina Mitchell, MD, 100 mg at 07/10/17 0918 .  feeding supplement (ENSURE ENLIVE) (ENSURE ENLIVE) liquid 237 mL, 237 mL, Oral, BID BM, Samuella Cota, MD, 237 mL at 07/10/17 1424 .  fluconazole (DIFLUCAN) IVPB 800 mg, 800 mg, Intravenous, Q24H, Tommy Medal, Lavell Islam, MD, Stopped at 07/11/17 1819 .  gabapentin (NEURONTIN) capsule 300 mg, 300 mg, Oral, BID, Serafina Mitchell, MD, 300 mg at 07/12/17 1039 .  guaiFENesin-dextromethorphan (ROBITUSSIN DM) 100-10 MG/5ML syrup 15 mL, 15 mL, Oral, Q4H PRN, Serafina Mitchell, MD .  iron polysaccharides (NIFEREX) capsule 150 mg, 150 mg, Oral, Daily, Regalado, Belkys A, MD, 150 mg at 07/12/17 1039 .  lactated ringers infusion, , Intravenous, Continuous, Lynda Rainwater, MD, Last Rate: 10 mL/hr at 07/05/17 1005 .  metroNIDAZOLE (FLAGYL) tablet 500 mg, 500 mg, Oral, Q8H, Tommy Medal, Lavell Islam, MD, 500 mg at 07/12/17 0542 .  morphine 2 MG/ML injection 2-5 mg, 2-5 mg, Intravenous, Q1H PRN, Serafina Mitchell, MD, 4 mg at 07/10/17 8144 .  multivitamin with minerals tablet 1 tablet, 1 tablet, Oral, Daily, Samuella Cota, MD, 1 tablet at 07/12/17 1039 .  ofloxacin (OCUFLOX) 0.3 % ophthalmic solution 1 drop, 1 drop, Both Eyes, QID, Jalene Mullet, MD, 1 drop at 07/11/17 2115 .  ondansetron (ZOFRAN) injection 4 mg, 4 mg, Intravenous, Q6H PRN, Serafina Mitchell, MD, 4 mg at 06/28/17 1244 .  oxyCODONE-acetaminophen (PERCOCET/ROXICET) 5-325 MG per tablet 1-2 tablet, 1-2 tablet, Oral, Q4H PRN, Conrad Iuka, MD, 1 tablet at 07/12/17 0825 .  pantoprazole (PROTONIX) EC tablet 40 mg, 40 mg, Oral, Daily, Serafina Mitchell, MD, 40 mg at 07/12/17 1039 .  phenol (CHLORASEPTIC) mouth spray 1 spray, 1 spray, Mouth/Throat, PRN, Serafina Mitchell, MD .  rivaroxaban (XARELTO) tablet 20 mg, 20 mg, Oral, Q supper, Wynell Balloon, RPH .  voriconazole SOLR 500 mcg, 500 mcg, Intravitreal, To OR, Jalene Mullet, MD .  voriconazole SOLR 500 mcg, 500 mcg, Intravitreal, To OR, Jalene Mullet, MD  Patients Current Diet: Diet Heart Room service appropriate? Yes; Fluid consistency: Thin  Precautions / Restrictions Precautions Precautions: None Restrictions Weight Bearing Restrictions: Yes LLE Weight Bearing: Non weight bearing   Has the patient had 2 or more falls or a fall with injury in the past year?No  Prior Activity Level Community (5-7x/wk): Mod I with quad cand, Independent with adls, flalccid LUE  Home Assistive Devices / [place] Devices/Equipment: Environmental consultant (specify type) Home Equipment: Cane - quad, Shower seat  Prior Device Use: Indicate devices/aids used by the patient prior to current illness, exacerbation or injury? cane   Prior Functional Level Prior Function Level of Independence: Independent with assistive device(s) Comments: uses quad cane for ambulation, attends Silver sneakers, independent with bathing and grooming   Self Care: Did the patient need help bathing, dressing, using the toilet or eating?  Independent  Indoor Mobility: Did the patient need assistance with walking  from room to room (with or without device)? Independent  Stairs: Did the patient need assistance with internal or external stairs (with or without device)? Independent  Functional Cognition: Did the patient need help planning regular tasks such as shopping or remembering to take medications? Needed some help  Current Functional Level Cognition  Overall Cognitive Status: Within Functional Limits for tasks assessed Orientation Level: Oriented X4    Extremity Assessment (includes Sensation/Coordination)  Upper Extremity Assessment: LUE deficits/detail LUE Deficits / Details: History of CVA with R hand contracture and increased tone.   Lower Extremity Assessment: Defer to PT evaluation RLE Deficits / Details: R hip and knee ROM limited by groin incision, strength grossly 3/5 RLE: Unable to fully assess due to pain LLE Deficits / Details: L LE ROM limited secondary to surgical pain and previous CVA, d LLE: Unable to fully assess due to pain LLE Sensation: decreased light touch (decreased below knee) LLE Coordination: decreased fine motor, decreased gross motor    ADLs  Overall ADL's : Needs assistance/impaired Eating/Feeding: Set up, Sitting Grooming: Set up, Sitting, Oral care Grooming Details (indicate cue type and reason): step-by-step set-up due to decreased functional use of L UE Upper Body Bathing: Min guard, Sitting Lower Body Bathing: Maximal assistance, Sitting/lateral leans, Sit to/from stand Upper Body Dressing : Minimal assistance, Sitting Lower Body Dressing: Total assistance, Sit to/from stand, Sitting/lateral leans Toilet Transfer: Total assistance Toilet Transfer Details (indicate cue type and reason): uanble  Toileting- Clothing Manipulation and Hygiene: Total assistance, Sit to/from stand Functional mobility during ADLs: Maximal assistance, Total assistance General ADL Comments: Session focused on core and R UE strengthening tasks in preparation for improved  ADL independence.     Mobility  Overal bed mobility: Needs Assistance Bed Mobility: Supine to Sit Rolling: Mod assist Supine to sit: HOB elevated, Mod assist Sit to  supine: Mod assist General bed mobility comments: assist to elevate trunk into sitting and to scoot hips toward EOB with use of bed pad; cues for technique; use of rail     Transfers  Overall transfer level: Needs assistance Equipment used: 2 person hand held assist Transfer via Lift Equipment: Stedy Transfers: Risk manager, Sit to/from Stand Sit to Stand: Mod assist, +2 physical assistance Stand pivot transfers: Min assist, +2 physical assistance Squat pivot transfers: Max assist, +2 safety/equipment, +2 physical assistance  Lateral/Scoot Transfers: +2 physical assistance, Max assist General transfer comment: cues for R foot placement and R hand placement; therapist blocked R foot to decrease risk of sliding; assist to power up and to gain balance upon stand and then for balance when pivoting R foot     Ambulation / Gait / Stairs / Wheelchair Mobility  Ambulation/Gait General Gait Details: bilat LE too painful this session to attempt    Posture / Balance Dynamic Sitting Balance Sitting balance - Comments: min assist or R UE support Balance Overall balance assessment: Needs assistance Sitting-balance support: Feet supported Sitting balance-Leahy Scale: Fair Sitting balance - Comments: min assist or R UE support Standing balance support: Bilateral upper extremity supported Standing balance-Leahy Scale: Poor Standing balance comment: Pt requires max A to maintain standing balance.     Special needs/care consideration BiPAP/CPAP  N/a CPM  N/a Continuous Drip IV heparin to Xarelto 07/12/2017 Dialysis  N/a Life Vest  N/a Oxygen  N/a Special Bed overlay mattress Trach Size  N/a Wound Vac 07/12/2017 AM      [] Hide copied text [place] Nurse wound consult note  Vascular team following for  assessment and plan of care to left leg wound and vac dressing. Reason for Consult: Vac dressing change to abd wound Wound type: Full thickness post-op wound with sutures visible to inner wound bed Wound bed: Appearance unchanged since previous assessment.  90% red and moist, 10% yellow slough to wound edges, decreasing amt of undermining to wound edges. 12X7X1cm with 2 cm undermining to lower wound edges from 5:00 o'clock to 7:00 o'clock Drainage (amount, consistency, odor) modamt tandrainage in the cannister, no odor Periwound: Intact skin surrounding Dressing procedure/placement/frequency: Applied Mepitel contact layer, then one half piece white foam, then one piece black foam. Pt medicated for pain prior to the procedure and tolerated with mod amt discomfort. WOC will plan to change abd Vac dressing Q M/W/F. Discussed plan of care and pt verbalized understanding. Julien Girt MSN, RN, [place], [place], [place]     RN to contact Dr. Sharol Given 07/12/17 to inquire into removal of AKA LLE incisional wound VAC rmoval  Skin wound VAC to abdomen site as well as AKA VAC to incision                            Bowel mgmt: incontinent LBM 07/12/17 Bladder mgmt: external catheter due to incontinence Diabetic mgmt   N/a   Previous Home Environment Living Arrangements:  (daughter and 15 year old grandson)  Lives With: Daughter (and adult grandson) Available Help at Discharge: Family, Available 24 hours/day Type of Home: House Home Layout: One level Home Access: Stairs to enter Entrance Stairs-Rails: None Entrance Stairs-Number of Steps: 5 Bathroom Shower/Tub: Optometrist: Yes Park: No  Discharge Living Setting Plans for Discharge Living Setting: Patient's home, Lives with (comment) (lives with daughter and adult grandson) Type of Home at Discharge: House Discharge  Home Layout: One level Discharge Home Access: Stairs  to enter Entrance Stairs-Rails: None Entrance Stairs-Number of Steps: 5 Discharge Bathroom Shower/Tub: Tub/shower unit Discharge Bathroom Toilet: Standard Discharge Bathroom Accessibility: Yes How Accessible: Accessible via walker Does the patient have any problems obtaining your medications?: No  Social/Family/Support Systems Patient Roles: Parent Contact Information: daughter Anticipated Caregiver: daughter and 8 year old grandson Anticipated Caregiver's Contact Information: see above Ability/Limitations of Caregiver: grandson with pt when daughter at work Caregiver Availability: 24/7 Discharge Plan Discussed with Primary Caregiver: Yes Is Caregiver In Agreement with Plan?: Yes Does Caregiver/Family have Issues with Lodging/Transportation while Pt is in Rehab?: No  Goals/Additional Needs Patient/Family Goal for Rehab: Min to mod asisst with PT and OT at wheelchair level Expected length of stay: ELOS 20-25 days Pt/Family Agrees to Admission and willing to participate: Yes Program Orientation Provided & Reviewed with Pt/Caregiver Including Roles  & Responsibilities: Yes  Decrease burden of Care through IP rehab admission: n/a  Possible need for SNF placement upon discharge: Possibly  Patient Condition: This patient's medical and functional status has changed since the consult dated: 06/28/2017 in which the Rehabilitation Physician determined and documented that the patient's condition is appropriate for intensive rehabilitative care in an inpatient rehabilitation facility. See "History of Present Illness" (above) for medical update. Functional changes are: mod to max assist. Patient's medical and functional status update has been discussed with the Rehabilitation physician and patient remains appropriate for inpatient rehabilitation. Will admit to inpatient rehab today.  Preadmission Screen Completed By:  Cleatrice Burke, 07/12/2017 12:18  PM ______________________________________________________________________   Discussed status with Dr. Posey Pronto on 07/12/2017 at 1215 and received telephone approval for admission today.  Admission Coordinator:  Cleatrice Burke, time [id number] Date 07/12/2017      Revision History

## 2017-07-12 NOTE — H&P (Signed)
Physical Medicine and Rehabilitation Admission H&P     HPI: Anna Hayes is a 66 y.o. female with history of palpitations, PAD, tobacco use, CVA with residual Left HP. Per chart review patient lives with children. Independent with assistive device prior to admission. One level home with 5 steps to entry. Family assistance is needed. Patient was who was admitted to [facility] with SBO who underwent lysis of adhesions and repair of perforated bowel 06/19/17. History taken from chart review. Hospital course complicated by fevers with leucocytosis due to s/p drain placement 10/1, fungemia and development of ischemic LLE due to occlusion of left popliteal and femoral arteries as well as thrombus in right femoral artery. She was started on IV heparin and transferred to [facility] on 10/2 due as patient with significant pain with motor and sensory deficits. She was taken to OR for BLE thrombectomy with left 4 compartment fasciotomy by Dr. Donavan Burnet ischemic changes of left lower extremity and underwent left AKA 07/05/2017 per Dr. Bridgett Larsson.Hospital course pain management as well as acute blood loss anemia 8.2 and monitored. Ophthalmology consulted to 07/07/2017 for suspect fungemis with endophthalamis OD>OS and placed on antifungal agents as well as follow-up per infectious disease. She also remains on Vantin as well as Flagyl to cover bacterial pathogens of abdominal abscess and plan to continue for 1 month and then drop the fluconazole 200 mg daily 2 week course. Underwent Pars Plana Vitrectomy, membrane peeling 07/11/2017 per ophthalmology. A TEE was completed showing no vegetation. Abdominal drain removed 07/11/2017. Patient was transitioned from intravenous heparin to Xarelto. MRI brain 10/13 reviewed, unremarkable for acute process. Therapy evaluations done today revealing difficulty WB on LE with instability RLE and  pain affecting mobility. CIR recommended due to deficits in mobility and  ability to carry out ADL tasks.Patient was admitted for a comprehensive rehabilitation program.  Review of Systems  Constitutional: Negative for chills and fever.  HENT: Negative for hearing loss.   Eyes: Negative for blurred vision and double vision.  Respiratory: Negative for cough and shortness of breath.   Cardiovascular: Positive for leg swelling. Negative for chest pain and palpitations.  Gastrointestinal: Positive for constipation and nausea.  Genitourinary: Negative for flank pain.  Musculoskeletal: Positive for myalgias.  Skin: Negative for rash.  Neurological: Positive for sensory change, focal weakness and headaches.  All other systems reviewed and are negative.  Past Medical History:  Diagnosis Date  . Frequent headaches   . Hypertension   . Muscle pain   . Palpitations   . Sinus problem   . Stroke [facility])    Past Surgical History:  Procedure Laterality Date  . AMPUTATION Left 07/05/2017   Procedure: LEFT ABOVE KNEE AMPUTATION;  Surgeon: Conrad Gibbsboro, MD;  Location: [place];  Service: Vascular;  Laterality: Left;  . EMBOLECTOMY Bilateral 06/27/2017   Procedure: BILATERAL FEMORAL POPLITEAL EMBOLECTOMY;  Surgeon: Serafina Mitchell, MD;  Location: [facility] OR;  Service: Vascular;  Laterality: Bilateral;  . FASCIOTOMY Left 06/27/2017   Procedure: FULL COMPARTMENT LEFT LOWER LEG FASCIOTOMY;  Surgeon: Serafina Mitchell, MD;  Location: [place];  Service: Vascular;  Laterality: Left;  . PATCH ANGIOPLASTY Left 06/27/2017   Procedure: PATCH ANGIOPLASTY Left Posterior Tibial Artery;  Surgeon: Serafina Mitchell, MD;  Location: [place];  Service: Vascular;  Laterality: Left;  . 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 07/11/2017   Procedure: REPAIR OF COMPLEX TRACTION RETINAL DETACHMENT WITH ENDO LASER AND ANTIFUNGAL INJECTION;  Surgeon: Jalene Mullet, MD;  Location: [place] OR;  Service: Ophthalmology;  Laterality: Right;  . TEE WITHOUT CARDIOVERSION N/A 07/11/2017   Procedure:  TRANSESOPHAGEAL ECHOCARDIOGRAM (TEE);  Surgeon: Acie Fredrickson Wonda Cheng, MD;  Location: [facility] ENDOSCOPY;  Service: Cardiovascular;  Laterality: N/A;   History reviewed. No pertinent family history. Social History:  reports that she has been smoking Cigarettes.  She has been smoking about 0.50 packs per day. She has never used smokeless tobacco. She reports that she does not drink alcohol or use drugs. Allergies:  Allergies  Allergen Reactions  . Latex Other (See Comments)    Blisters  . Codeine     UNSPECIFIED REACTION   . Penicillins Hives and Swelling   Medications Prior to Admission  Medication Sig Dispense Refill  . amLODipine (NORVASC) 5 MG tablet Take 5 mg by mouth daily.     Marland Kitchen atenolol (TENORMIN) 50 MG tablet Take 25 mg by mouth daily.     . betamethasone dipropionate (DIPROLENE) 0.05 % cream     . butalbital-acetaminophen-caffeine (FIORICET, ESGIC) 50-325-40 MG per tablet Take 2 tablets by mouth every 6 (six) hours as needed for headache or migraine.     . dicyclomine (BENTYL) 10 MG capsule Take 10 mg by mouth 3 (three) times daily before meals.     . furosemide (LASIX) 20 MG tablet Take 20 mg by mouth daily.     Marland Kitchen gabapentin (NEURONTIN) 300 MG capsule Take 300 mg by mouth 3 (three) times daily.     Marland Kitchen levothyroxine (SYNTHROID, LEVOTHROID) 25 MCG tablet Take 25 mcg by mouth daily.     Marland Kitchen lisinopril (PRINIVIL,ZESTRIL) 10 MG tablet Take 10 mg by mouth daily.     Marland Kitchen omeprazole (PRILOSEC) 40 MG capsule Take 40 mg by mouth daily.     . simvastatin (ZOCOR) 40 MG tablet Take 40 mg by mouth daily.       Drug Regimen Review  Drug regimen was reviewed and remains appropriate with no significant issues identified  Home: Home Living Family/patient expects to be discharged to:: Private residence Living Arrangements:  (daughter and 26 year old grandson) Available Help at Discharge: Family, Available 24 hours/day Type of Home: House Home Access: Stairs to enter Technical brewer of Steps:  5 Entrance Stairs-Rails: None Home Layout: One level Bathroom Shower/Tub: Chiropodist: Standard Bathroom Accessibility: Yes Home Equipment: Radio producer - quad, Civil engineer, contracting  Lives With: Daughter (and adult grandson)   Functional History: Prior Function Level of Independence: Independent with assistive device(s) Comments: uses quad cane for ambulation, attends Silver sneakers, independent with bathing and grooming   Functional Status:  Mobility: Bed Mobility Overal bed mobility: Needs Assistance Bed Mobility: Supine to Sit Rolling: Mod assist Supine to sit: HOB elevated, Mod assist Sit to supine: Mod assist General bed mobility comments: assist to elevate trunk into sitting and to scoot hips toward EOB with use of bed pad; cues for technique; use of rail  Transfers Overall transfer level: Needs assistance Equipment used: 2 person hand held assist Transfer via Lift Equipment: Stedy Transfers: Risk manager, Sit to/from Stand Sit to Stand: Mod assist, +2 physical assistance Stand pivot transfers: Min assist, +2 physical assistance Squat pivot transfers: Max assist, +2 safety/equipment, +2 physical assistance  Lateral/Scoot Transfers: +2 physical assistance, Max assist General transfer comment: cues for R foot placement and R hand placement; therapist blocked R foot to decrease risk of sliding; assist to power up and to gain balance upon stand and then for balance when pivoting R foot  Ambulation/Gait  General Gait Details: bilat LE too painful this session to attempt    ADL: ADL Overall ADL's : Needs assistance/impaired Eating/Feeding: Set up, Sitting Grooming: Set up, Sitting, Oral care Grooming Details (indicate cue type and reason): step-by-step set-up due to decreased functional use of L UE Upper Body Bathing: Min guard, Sitting Lower Body Bathing: Maximal assistance, Sitting/lateral leans, Sit to/from stand Upper Body Dressing : Minimal assistance,  Sitting Lower Body Dressing: Total assistance, Sit to/from stand, Sitting/lateral leans Toilet Transfer: Total assistance Toilet Transfer Details (indicate cue type and reason): uanble  Toileting- Clothing Manipulation and Hygiene: Total assistance, Sit to/from stand Functional mobility during ADLs: Maximal assistance, Total assistance General ADL Comments: Session focused on core and R UE strengthening tasks in preparation for improved ADL independence.   Cognition: Cognition Overall Cognitive Status: Within Functional Limits for tasks assessed Orientation Level: Oriented X4 Cognition Arousal/Alertness: Awake/alert Behavior During Therapy: WFL for tasks assessed/performed Overall Cognitive Status: Within Functional Limits for tasks assessed  Physical Exam: Blood pressure 124/68, pulse 98, temperature 98.4 F (36.9 C), temperature source Oral, resp. rate 14, height '5\' 2"'  (1.575 m), weight 64 kg (141 lb), SpO2 100 %. Physical Exam  Vitals reviewed. Constitutional: She appears well-developed.  Obese  HENT:  Head: Normocephalic and atraumatic.  Eyes: EOM are normal. Right eye exhibits no discharge. Left eye exhibits no discharge.  Neck: Normal range of motion. Neck supple.  Cardiovascular: Normal rate and regular rhythm.   Respiratory: Effort normal and breath sounds normal.  GI: Soft. Bowel sounds are normal.  Musculoskeletal: She exhibits edema and tenderness.  Left AKA  Neurological: She is alert.  Left facial weakness with minimal dysarthria.  LUE with edema  Left hand with contracture.  Able to follow basic commands without difficulty.  Motor: RUE: 5/5 proximal to distal LUE: 0/5 proximal to distal with tone and contracture RLE: HF, KE 4/5, ADF/PF 4/5 (pain inhibition) LLE: HF 4/5 (pain inhibition)  Skin:  +VAC  Psychiatric: Her affect is blunt. She is slowed.   Results for orders placed or performed during the hospital encounter of 06/27/17 (from the past 48 hour(s))    Heparin level (unfractionated)     Status: Abnormal   Collection Time: 07/11/17  3:41 AM  Result Value Ref Range   Heparin Unfractionated <0.10 (L) 0.30 - 0.70 IU/mL    Comment:        IF HEPARIN RESULTS ARE BELOW EXPECTED VALUES, AND PATIENT DOSAGE HAS BEEN CONFIRMED, SUGGEST FOLLOW UP TESTING OF ANTITHROMBIN III LEVELS.   CBC     Status: Abnormal   Collection Time: 07/11/17  3:41 AM  Result Value Ref Range   WBC 9.8 4.0 - 10.5 K/uL   RBC 3.41 (L) 3.87 - 5.11 MIL/uL   Hemoglobin 8.6 (L) 12.0 - 15.0 g/dL   HCT 27.1 (L) 36.0 - 46.0 %   MCV 79.5 78.0 - 100.0 fL   MCH 25.2 (L) 26.0 - 34.0 pg   MCHC 31.7 30.0 - 36.0 g/dL   RDW 20.8 (H) 11.5 - 15.5 %   Platelets 463 (H) 150 - 400 K/uL  Basic metabolic panel     Status: Abnormal   Collection Time: 07/11/17  3:41 AM  Result Value Ref Range   Sodium 136 135 - 145 mmol/L   Potassium 3.5 3.5 - 5.1 mmol/L   Chloride 103 101 - 111 mmol/L   CO2 26 22 - 32 mmol/L   Glucose, Bld 101 (H) 65 - 99 mg/dL   BUN <5 (  L) 6 - 20 mg/dL   Creatinine, Ser 0.66 0.44 - 1.00 mg/dL   Calcium 8.2 (L) 8.9 - 10.3 mg/dL   GFR calc non Af Amer >60 >60 mL/min   GFR calc Af Amer >60 >60 mL/min    Comment: (NOTE) The eGFR has been calculated using the CKD EPI equation. This calculation has not been validated in all clinical situations. eGFR's persistently <60 mL/min signify possible Chronic Kidney Disease.    Anion gap 7 5 - 15  Protime-INR     Status: None   Collection Time: 07/11/17  3:41 AM  Result Value Ref Range   Prothrombin Time 14.5 11.4 - 15.2 seconds   INR 1.14   Heparin level (unfractionated)     Status: Abnormal   Collection Time: 07/12/17  3:03 AM  Result Value Ref Range   Heparin Unfractionated <0.10 (L) 0.30 - 0.70 IU/mL    Comment:        IF HEPARIN RESULTS ARE BELOW EXPECTED VALUES, AND PATIENT DOSAGE HAS BEEN CONFIRMED, SUGGEST FOLLOW UP TESTING OF ANTITHROMBIN III LEVELS.   CBC     Status: Abnormal   Collection Time:  07/12/17  3:03 AM  Result Value Ref Range   WBC 8.2 4.0 - 10.5 K/uL   RBC 3.47 (L) 3.87 - 5.11 MIL/uL   Hemoglobin 8.6 (L) 12.0 - 15.0 g/dL   HCT 27.5 (L) 36.0 - 46.0 %   MCV 79.3 78.0 - 100.0 fL   MCH 24.8 (L) 26.0 - 34.0 pg   MCHC 31.3 30.0 - 36.0 g/dL   RDW 21.0 (H) 11.5 - 15.5 %   Platelets 370 150 - 400 K/uL   No results found.     Medical Problem List and Plan: 1.  Decreased functional mobility secondary to left AKA10/06/2017 as well as history of CVA with left-sided residual weakness. 2.  DVT Prophylaxis/Anticoagulation: Intravenous heparin transitioned to Xarelto 07/12/2017 3. Pain Management: Neurontin 300 mg twice a day,Oxycodone as needed 4. Mood:  Provide emotional support 5. Neuropsych: This patient is capable of making decisions on her own behalf. 6. Skin/Wound Care:  Routine skin checks 7. Fluids/Electrolytes/Nutrition:  Routine eye and nose with follow-up chemistries 8.Abdominal abscesses. Status post laparotomy, lysis of adhesions repair perforated bowel 06/19/2017 at [facility] post percutaneous drain placement October 1 and removed 07/11/2017.Continue Vantin 200 mg every 12 hours, Flagyl 500 mg every 8 hours.ID TO F/U ON DURATION OF ANTIBIOTICS 9.Fungemia with endophthalmitis.follow-up ophthalmology services.Intravenous fluconazole 800 mg daily with plan to change to oral therapy per infectious disease with plan for 6 weeks of therapy. Status post Pars Plana Vitrectomy, membrane peeling 07/11/2017 10.Acute blood loss anemia. Follow-up CBC. Continue iron supplement 11.Hypertension. Norvasc 5 mg daily 12.tobacco abuse. Counseling 13. Constipation. Laxative assistance  Post Admission Physician Evaluation: 1. Preadmission assessment reviewed and changes made below. 2. Functional deficits secondary  to left AKA10/06/2017 as well as history of CVA with left-sided residual weakness. 3. Patient is admitted to receive collaborative, interdisciplinary care  between the physiatrist, rehab nursing staff, and therapy team. 4. Patient's level of medical complexity and substantial therapy needs in context of that medical necessity cannot be provided at a lesser intensity of care such as a SNF. 5. Patient has experienced substantial functional loss from his/her baseline which was documented above under the "Functional History" and "Functional Status" headings.  Judging by the patient's diagnosis, physical exam, and functional history, the patient has potential for functional progress which will result in measurable gains while on inpatient  rehab.  These gains will be of substantial and practical use upon discharge  in facilitating mobility and self-care at the household level. 61. Physiatrist will provide 24 hour management of medical needs as well as oversight of the therapy plan/treatment and provide guidance as appropriate regarding the interaction of the two. 7. 24 hour rehab nursing will assist with safety, skin/wound care, disease management, medication administration, pain management and patient education  and help integrate therapy concepts, techniques,education, etc. 8. PT will assess and treat for/with: Lower extremity strength, range of motion, stamina, balance, functional mobility, safety, adaptive techniques and equipment, woundcare, coping skills, pain control, pre-prosthetic education.   Goals are: Supervision/Min A. 9. OT will assess and treat for/with: ADL's, functional mobility, safety, upper extremity strength, adaptive techniques and equipment, wound mgt, ego support, and community reintegration.   Goals are: Supervision/Min A. Therapy may not proceed with showering this patient. 10. SLP will assess and treat for/with: cognition.  Goals are: Mod I/Supervision. 11. Case Management and Social Worker will assess and treat for psychological issues and discharge planning. 12. Team conference will be held weekly to assess progress toward goals and to  determine barriers to discharge. 13. Patient will receive at least 3 hours of therapy per day at least 5 days per week. 14. ELOS: 14-17 days.       15. Prognosis:  good  Delice Lesch, MD, ABPMR Lauraine Rinne, PA-C 07/12/2017

## 2017-07-12 NOTE — Progress Notes (Signed)
Spoke with Dr. Trula Slade and pt is okay to transition to Xarelto.   Anna Hayes, [facility] 07/12/2017 11:35 AM

## 2017-07-12 NOTE — Care Management Note (Signed)
Case Management Note Marvetta Gibbons RN, BSN Unit 4E-Case Manager [phone]  Patient Details  Name: Anna Hayes MRN: [id number] Date of Birth: 1951-06-27  Subjective/Objective:   Pt. Originally admitted to [facility] 9/22 with small bowel obstruction s/p exp. Lap with lysis of adhesions and repair of small bowel perforation. Post op  had fevers, leukocytosis and CT abdomen and pelvis 9/29 revealed pelvic abscess had placement of percutaneous drain October 1. +  fungemia on 1/2 blood culture 9/27 and started on antifungal therapy. Developed critical left leg ischemia- and emergently transferred to [facility] where she underwent thrombectomy/embolectomy and fasciotomy with wound VAC placement October 2.   Action/Plan: PTA pt lived at home- PT/OT evals pending- CM to follow for d/c needs-   Expected Discharge Date:  07/12/17               Expected Discharge Plan:  [place]  In-House Referral:  Clinical Social Work  Discharge planning Services  CM Consult  Post Acute Care Choice:  NA Choice offered to:  NA  DME Arranged:    DME Agency:     HH Arranged:    [place] Agency:     Status of Service:  Completed, signed off  If discussed at [employer] of Stay Meetings, dates discussed:  10/16  Discharge Disposition: IP rehab (CIR)   Additional Comments:  07/12/17- 1400- Marvetta Gibbons RN, CM- pt for d/c today- per Pamala Hurry CIR has bed available today and will plan to admit today- bedside RN has contacted MD regarding incisional wound VAC- which will be d/c'd prior to tx to CIR.  07/11/17- 1145- Ellia Knowlton RN, CM- spoke with Pamala Hurry from [facility]- they continue to follow pt for possible admission- pt for TEE today and then OR for eye surgery later this afternoon with Dr. Posey Pronto- in light of this CIR will look to possibly admit pt tomorrow 10/17- will f/u with Pamala Hurry in AM for bed availability.   07/07/17- 1500- Kaevion Sinclair RN, CM- pt s/p L BKA - continues with  abd wound VAC- also has incisional wound VAC to BKA- CIR continues to follow for possible admission once medically stable for discharge.   Dawayne Patricia, RN 07/12/2017, 2:12 PM

## 2017-07-12 NOTE — Progress Notes (Signed)
Jamse Arn, MD Physician Signed Physical Medicine and Rehabilitation  Consult Note Date of Service: 06/28/2017 2:22 PM  Related encounter: Admission (Current) from 06/27/2017 in [facility] 4E CV SURGICAL PROGRESSIVE CARE     Expand All Collapse All   [] Hide copied text [] Hover for attribution information      Physical Medicine and Rehabilitation Consult  Reason for Consult: Deficits in mobility and ability to carry out ADL tasks due to BLE ischemia.  Referring Physician: Dr. Sarajane Jews.    HPI: Anna Hayes is a 66 y.o. female with history of palpitations, PAD, tobacco use, CVA with residual Left HP who was admitted to [facility] with SBO who underwent lysis of adhesions and repair of perforated bowel 06/19/17. History taken from chart review. Hospital course complicated by fevers with leucocytosis due to s/p drain placement 10/1, fungemia and development of ischemic LLE due to occlusion of left popliteal and femoral arteries as well as thrombus in right femoral artery. She was started on IV heparin and transferred to [facility] on 10/2 due as patient with significant pain with motor and sensory deficits. She was taken to OR for BLE thrombectomy with left 4 compartment fasciotomy by Dr. Trula Slade. Therapy evaluations done today revealing difficulty WB on LE with instability RLE and  pain affecting mobility. CIR recommended due to deficits in mobility and ability to carry out ADL tasks.    Review of Systems  Constitutional: Negative for chills and fever.  HENT: Negative for hearing loss and tinnitus.   Eyes: Negative for blurred vision and double vision.  Respiratory: Negative for cough and wheezing.   Cardiovascular: Positive for leg swelling. Negative for chest pain and palpitations.  Gastrointestinal: Positive for constipation (No BM for 2 days). Negative for abdominal pain, heartburn, nausea and vomiting.  Genitourinary: Negative for dysuria and urgency.  Musculoskeletal:  Positive for myalgias. Negative for back pain and neck pain.  Skin: Negative for itching and rash.  Neurological: Positive for sensory change and focal weakness. Negative for dizziness and headaches.  Psychiatric/Behavioral: The patient does not have insomnia.   All other systems reviewed and are negative.         Past Medical History:  Diagnosis Date  . Frequent headaches   . Muscle pain   . Palpitations   . Sinus problem   . Stroke [facility])     No past surgical history on file.    No family history on file.    Social History:  Lives with family. Used to work as an Engineer, production. Daughter works at night and grandson works days.  Independent and helps with cooking and housework. She reports that she has been smoking Cigarettes.  She has been smoking about 0.50 packs per day. She has never used smokeless tobacco. She reports that she does not drink alcohol or use drugs.          Allergies  Allergen Reactions  . Codeine   . Latex     Blisters  . Penicillins           Medications Prior to Admission  Medication Sig Dispense Refill  . amLODipine (NORVASC) 5 MG tablet Take 5 mg by mouth daily.     Marland Kitchen atenolol (TENORMIN) 50 MG tablet Take 25 mg by mouth daily.     . betamethasone dipropionate (DIPROLENE) 0.05 % cream     . butalbital-acetaminophen-caffeine (FIORICET, ESGIC) 50-325-40 MG per tablet Take 2 tablets by mouth every 6 (six) hours as needed for headache or migraine.     Marland Kitchen  dicyclomine (BENTYL) 10 MG capsule Take 10 mg by mouth 3 (three) times daily before meals.     . furosemide (LASIX) 20 MG tablet Take 20 mg by mouth daily.     Marland Kitchen gabapentin (NEURONTIN) 300 MG capsule Take 300 mg by mouth 3 (three) times daily.     Marland Kitchen levothyroxine (SYNTHROID, LEVOTHROID) 25 MCG tablet Take 25 mcg by mouth daily.     Marland Kitchen lisinopril (PRINIVIL,ZESTRIL) 10 MG tablet Take 10 mg by mouth daily.     Marland Kitchen omeprazole (PRILOSEC) 40 MG capsule Take 40 mg by mouth  daily.     . simvastatin (ZOCOR) 40 MG tablet Take 40 mg by mouth daily.       Home: Home Living Family/patient expects to be discharged to:: Private residence Living Arrangements: Children, Other relatives Available Help at Discharge: Family, Available 24 hours/day Type of Home: House Home Access: Stairs to enter [employer] of Steps: 5 Entrance Stairs-Rails: None Home Layout: One level Bathroom Shower/Tub: Chiropodist: Standard Bathroom Accessibility: Yes Home Equipment: Cane - quad, Shower seat  Functional History: Prior Function Level of Independence: Independent with assistive device(s) Comments: uses quad cane for ambulation, attends Silver sneakers, independent with bathing and grooming  Functional Status:  Mobility: Bed Mobility Overal bed mobility: Needs Assistance Bed Mobility: Supine to Sit, Rolling Rolling: Min assist Supine to sit: Mod assist, +2 for physical assistance General bed mobility comments: min A for rolling to L x2 for bed pan placement and removal, modA for trunk to upright, LE management off bed and pad scoot of hips to EoB Transfers Overall transfer level: Needs assistance Equipment used: 1 person hand held assist, 2 person hand held assist Transfers: Lateral/Scoot Transfers, Squat Pivot Transfers Squat pivot transfers: Total assist  Lateral/Scoot Transfers: +2 physical assistance, Min assist, Mod assist General transfer comment: pt initially attempted squat pivot transfer but unable to get her weight over her R LE and R floot slid forward on floor, on 2nd attempt PT tried to block R knee but too painful, dropped arm of recliner and bed and pt was able to perform lateral scoot to recliner initially with muliple scoots of hips initially modAx2, but by last scoot only required minA of one to block R foot  ADL: ADL Overall ADL's : Needs assistance/impaired Eating/Feeding: Set up, Sitting Grooming: Set up,  Sitting Upper Body Bathing: Min guard, Sitting Lower Body Bathing: Maximal assistance, Sitting/lateral leans Upper Body Dressing : Min guard, Sitting Lower Body Dressing: Maximal assistance, Sitting/lateral leans Toilet Transfer: +2 for physical assistance, Moderate assistance (lateral scoot; attempted squat pivot) Toilet Transfer Details (indicate cue type and reason): Unable to complete squat-pivot transfer.  Toileting- Clothing Manipulation and Hygiene: Maximal assistance, Sitting/lateral lean General ADL Comments: Pt very motivated to participate and demonstrates an excellent outlook.   Cognition: Cognition Overall Cognitive Status: Within Functional Limits for tasks assessed Orientation Level: Oriented X4 Cognition Arousal/Alertness: Awake/alert Behavior During Therapy: WFL for tasks assessed/performed Overall Cognitive Status: Within Functional Limits for tasks assessed   Blood pressure 116/69, pulse 97, temperature (!) 97.5 F (36.4 C), temperature source Oral, resp. rate 13, height 5\' 2"  (1.575 m), weight 79.6 kg (175 lb 8 oz), SpO2 100 %. Physical Exam  Nursing note and vitals reviewed. Constitutional: She is oriented to person, place, and time. She appears well-developed and well-nourished. No distress.  HENT:  Head: Normocephalic and atraumatic.  Eyes: Pupils are equal, round, and reactive to light. Conjunctivae and EOM are normal.  Neck: Normal  range of motion. Neck supple.  Cardiovascular: Normal rate and regular rhythm.   Respiratory: Effort normal and breath sounds normal. No stridor. No respiratory distress. She has no wheezes.  GI: Soft. She exhibits distension.  Musculoskeletal: She exhibits edema and tenderness.  Left foot dark and mottle appearing with coolness.   Neurological: She is alert and oriented to person, place, and time.  Left facial weakness with minimal dysarthria.  LUE with edema  Left hand with contracture.  Able to follow basic commands  without difficulty.  Motor: RUE: 5/5 proximal to distal LUE: 0/5 proximal to distal with tone and contracture RLE: HF, KE 2/5, ADF/PF 3/5 (pain inhibition) LLE: HF 1/5 (pain inhibition)  Skin: Skin is warm and dry. She is not diaphoretic.  RLE with multiple incisions and pain with attempts at ROM.  LLE with VAC in place and foot drop.  Psychiatric: She has a normal mood and affect. Her behavior is normal. Judgment and thought content normal.    Lab Results Last 24 Hours       Results for orders placed or performed during the hospital encounter of 06/27/17 (from the past 24 hour(s))  Comprehensive metabolic panel     Status: Abnormal   Collection Time: 06/27/17  3:50 PM  Result Value Ref Range   Sodium 133 (L) 135 - 145 mmol/L   Potassium 4.3 3.5 - 5.1 mmol/L   Chloride 111 101 - 111 mmol/L   CO2 19 (L) 22 - 32 mmol/L   Glucose, Bld 92 65 - 99 mg/dL   BUN <5 (L) 6 - 20 mg/dL   Creatinine, Ser 0.71 0.44 - 1.00 mg/dL   Calcium 6.8 (L) 8.9 - 10.3 mg/dL   Total Protein 4.3 (L) 6.5 - 8.1 g/dL   Albumin 1.4 (L) 3.5 - 5.0 g/dL   AST 44 (H) 15 - 41 U/L   ALT 22 14 - 54 U/L   Alkaline Phosphatase 47 38 - 126 U/L   Total Bilirubin 0.4 0.3 - 1.2 mg/dL   GFR calc non Af Amer >60 >60 mL/min   GFR calc Af Amer >60 >60 mL/min   Anion gap 3 (L) 5 - 15  I-STAT 7, (LYTES, BLD GAS, ICA, H+H)     Status: Abnormal   Collection Time: 06/27/17  4:16 PM  Result Value Ref Range   pH, Arterial 7.289 (L) 7.350 - 7.450   pCO2 arterial 41.7 32.0 - 48.0 mmHg   pO2, Arterial 156.0 (H) 83.0 - 108.0 mmHg   Bicarbonate 20.0 20.0 - 28.0 mmol/L   TCO2 21 (L) 22 - 32 mmol/L   O2 Saturation 99.0 %   Acid-base deficit 6.0 (H) 0.0 - 2.0 mmol/L   Sodium 138 135 - 145 mmol/L   Potassium 4.9 3.5 - 5.1 mmol/L   Calcium, Ion 1.00 (L) 1.15 - 1.40 mmol/L   HCT 32.0 (L) 36.0 - 46.0 %   Hemoglobin 10.9 (L) 12.0 - 15.0 g/dL   Patient temperature 36.9 C    Sample type ARTERIAL    CBC     Status: Abnormal   Collection Time: 06/27/17  6:03 PM  Result Value Ref Range   WBC 27.6 (H) 4.0 - 10.5 K/uL   RBC 4.14 3.87 - 5.11 MIL/uL   Hemoglobin 10.4 (L) 12.0 - 15.0 g/dL   HCT 30.9 (L) 36.0 - 46.0 %   MCV 74.6 (L) 78.0 - 100.0 fL   MCH 25.1 (L) 26.0 - 34.0 pg   MCHC 33.7 30.0 -  36.0 g/dL   RDW 16.1 (H) 11.5 - 15.5 %   Platelets 196 150 - 400 K/uL  APTT     Status: Abnormal   Collection Time: 06/27/17  6:03 PM  Result Value Ref Range   aPTT 155 (H) 24 - 36 seconds  CBC     Status: Abnormal   Collection Time: 06/28/17  4:23 AM  Result Value Ref Range   WBC 27.2 (H) 4.0 - 10.5 K/uL   RBC 3.75 (L) 3.87 - 5.11 MIL/uL   Hemoglobin 9.5 (L) 12.0 - 15.0 g/dL   HCT 27.6 (L) 36.0 - 46.0 %   MCV 73.6 (L) 78.0 - 100.0 fL   MCH 25.3 (L) 26.0 - 34.0 pg   MCHC 34.4 30.0 - 36.0 g/dL   RDW 16.5 (H) 11.5 - 15.5 %   Platelets 192 150 - 400 K/uL  Basic metabolic panel     Status: Abnormal   Collection Time: 06/28/17  4:23 AM  Result Value Ref Range   Sodium 135 135 - 145 mmol/L   Potassium 4.7 3.5 - 5.1 mmol/L   Chloride 108 101 - 111 mmol/L   CO2 20 (L) 22 - 32 mmol/L   Glucose, Bld 107 (H) 65 - 99 mg/dL   BUN 5 (L) 6 - 20 mg/dL   Creatinine, Ser 0.74 0.44 - 1.00 mg/dL   Calcium 6.7 (L) 8.9 - 10.3 mg/dL   GFR calc non Af Amer >60 >60 mL/min   GFR calc Af Amer >60 >60 mL/min   Anion gap 7 5 - 15  APTT     Status: Abnormal   Collection Time: 06/28/17  7:52 AM  Result Value Ref Range   aPTT 70 (H) 24 - 36 seconds      Imaging Results (Last 48 hours)  Dg Abd Portable 1v  Result Date: 06/28/2017 CLINICAL DATA:  Abdominal pain and nausea. EXAM: PORTABLE ABDOMEN - 1 VIEW COMPARISON:  None. FINDINGS: The bowel gas pattern is normal. No radio-opaque calculi or other significant radiographic abnormality are seen. Pigtail drainage catheter seen in the pelvis. Enteric contrast from prior CT. Degenerative change both hips. IMPRESSION: No  obstruction or visible free air. Electronically Signed   By: Staci Righter M.D.   On: 06/28/2017 13:47     Assessment/Plan: Diagnosis: Debility Labs independently reviewed.  Records reviewed and summated above.  1. Does the need for close, 24 hr/day medical supervision in concert with the patient's rehab needs make it unreasonable for this patient to be served in a less intensive setting? Yes 2. Co-Morbidities requiring supervision/potential complications: PAD (transition from heparin ggt when appropriate), tobacco use (counsel), CVA with residual Left HP (consider anti-spasticity meds), fevers, ABLA (transfuse if necessary to ensure appropriate perfusion for increased activity tolerance), leukocytosis (cont to monitor for signs and symptoms of infection, further workup if indicated, narrow IV abx, including Vanc when appropriate), post-op pain (Biofeedback training with therapies to help reduce reliance on opiate pain medications, particularly IV morphine, monitor pain control during therapies, and sedation at rest and titrate to maximum efficacy to ensure participation and gains in therapies) 3. Due to safety, skin/wound care, disease management, pain management and patient education, does the patient require 24 hr/day rehab nursing? Yes 4. Does the patient require coordinated care of a physician, rehab nurse, PT (1-2 hrs/day, 5 days/week) and OT (1-2 hrs/day, 5 days/week) to address physical and functional deficits in the context of the above medical diagnosis(es)? Yes Addressing deficits in the following areas: balance,  endurance, locomotion, strength, transferring, bathing, dressing, toileting and psychosocial support 5. Can the patient actively participate in an intensive therapy program of at least 3 hrs of therapy per day at least 5 days per week? No 6. The potential for patient to make measurable gains while on inpatient rehab is excellent 7. Anticipated functional outcomes upon discharge  from inpatient rehab are mod assist  with PT, mod assist with OT, n/a with SLP. 8. Estimated rehab length of stay to reach the above functional goals is: 20-25 days. 9. Anticipated D/C setting: Other 10. Anticipated post D/C treatments: SNF 11. Overall Rehab/Functional Prognosis: good  RECOMMENDATIONS: This patient's condition is appropriate for continued rehabilitative care in the following setting: CIR if/when able to tolerate 3 hours of therapy/day. Patient has agreed to participate in recommended program. Yes Note that insurance prior authorization may be required for reimbursement for recommended care.  Comment: Rehab Admissions Coordinator to follow up.  Delice Lesch, MD, ABPMR [place], [place] 06/28/2017    Revision History                        Routing History

## 2017-07-12 NOTE — Progress Notes (Signed)
[facility] for Infectious Disease    Date of Admission:  07/12/2017   Total days of antibiotics 15   ID: Anna Hayes is a 66 y.o. female with hx of SBO c/b perforattion s/p ex lap c/b  intra-abdominal abscess and secondary fungemia with c.tropicalis. Also had limb ischemia s/p embolectomy and angioplasty. 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  PPD#1 s/p [place] Vitrectomy, Membrane Peeling, Endolaser, Fluid Gas Exchange and membranectomy with tractional retinal detachment repair and tap for culture  She also underwent TEE which was negative for vegetations   Medications:  . [START ON 07/13/2017] amLODipine  5 mg Oral Daily  . cefpodoxime  200 mg Oral Q12H  . [START ON 07/13/2017] docusate sodium  100 mg Oral Daily  . gabapentin  300 mg Oral BID  . [START ON 07/13/2017] iron polysaccharides  150 mg Oral Daily  . metroNIDAZOLE  500 mg Oral Q8H  . [START ON 07/13/2017] multivitamin with minerals  1 tablet Oral Daily  . ofloxacin  1 drop Both Eyes QID  . [START ON 07/13/2017] pantoprazole  40 mg Oral Daily  . [START ON 07/13/2017] rivaroxaban  20 mg Oral Q supper    Objective: Vital signs in last 24 hours: Temp:  [98.3 F (36.8 C)-99.2 F (37.3 C)] 98.5 F (36.9 C) (10/17 1733) Pulse Rate:  [86-99] 97 (10/17 1733) Resp:  [12-18] 18 (10/17 1733) BP: (110-125)/(61-100) 125/67 (10/17 1733) SpO2:  [97 %-100 %] 100 % (10/17 1733) Physical Exam  Constitutional:  oriented to person, place, and time. appears well-developed and well-nourished. No distress.  HENT: [place]/AT, PERRLA, no scleral icterus Mouth/Throat: Oropharynx is clear and moist. No oropharyngeal exudate.  Cardiovascular: Normal rate, regular rhythm and normal heart sounds. Exam reveals  no gallop and no friction rub.  No murmur heard.  Pulmonary/Chest: Effort normal and breath sounds normal. No respiratory distress.  has no wheezes.  Abdominal: bandage midline.  Neurological: alert and oriented to person, place, and time.  Skin: Skin is warm and dry. No rash noted. No erythema.  Psychiatric: a normal mood and affect.  behavior is normal.   Lab Results  Recent Labs  07/10/17 1459 07/11/17 0341 07/12/17 0303  WBC  --  9.8 8.2  HGB  --  8.6* 8.6*  HCT  --  27.1* 27.5*  NA 136 136  --   K 3.5 3.5  --   CL 102 103  --   CO2 25 26  --   BUN <5* <5*  --   CREATININE 0.77 0.66  --     Microbiology: 10/13 blood cx ngtd 10/6 pelvis fluid ngtd 10/3 blood cx 1 of 4 bottles CoNS   Studies/Results: CT pelvis: Other: Open midline lower abdominal wound. Decreased size of rim enhancing collection deep to the wound at the peritoneal margin measuring 18 x 51 mm (AP by ML series 4 : Image 61).  Stable rim enhancing collection in the right hemipelvis measuring 18 mm (4:73).  Decreased size of posterior pelvis rim enhancing collection containing pigtail catheter measuring 14 x 31 mm (AP by ML 4:73).  Fluid collection overlying the left femoral vessels without enhancement is increased in size measuring 37 x 43 mm (AP by ML 4:85). Stable adjacent air fluid nonenhancing collection in subcutaneous fat.  Musculoskeletal:  No fracture is seen.  IMPRESSION: 1. Pelvis and lower abdominal rim enhancing fluid collections compatible with abscess are stable to decreased in size. The abscess in the posterior pelvis containing the drainage catheter is decreased in size. No new abscess identified. 2. Increase in size of nonenhancing fluid collection overlying the left femoral vessels, probably postoperative seroma. 3. Increased dilatation of proximal small bowel compatible with Obstruction.   Assessment/Plan: Endogenous candidal endophthalmitis with vitritis s/p Iv  vitreal voriconazole infusion 2/2 c.tropicalis bacteremia = continue with plan of doing 6 wk of IV fluconazole. Currently on day 14 of IV fluconazole. Treating as septic thrombophlebitis. Endocarditis ruled out.  -continue at fluconazole 800mg  daily for addn 4 weeks from today, until nov 14th. Can swithc to oral formulation - check weekly hepatic panel, which is due tomorrow - if starting to see LFT 5 x ULN, would decrease dose to fluconazole 400mg  daily. Once ready to go home can switch to orals - recommend to have weekly eye exam, until lesions improved. At minimum will need eye exam in 4 weeks to assess if fluconazole needs to be continued  Intra-abdominal abscess  Continue on cefpodoxime plus metronidazole (in addn to fluconazole) for 3-4 more weeks.  Would repeat abd CT at that time to see if intra abdominal fluid collections have resolved, then can stop cefpodoxime   We will see back in the ID clinic in [place], [facility] for Infectious Diseases Cell: [phone] Pager: [phone]  07/12/2017, 7:36 PM

## 2017-07-12 NOTE — Progress Notes (Signed)
Nutrition Follow-up  DOCUMENTATION CODES:   Obesity unspecified  INTERVENTION:    D/C Ensure Enlive  Start Boost Plus BID, patient prefers this supplement  NUTRITION DIAGNOSIS:   Increased nutrient needs related to wound healing as evidenced by estimated needs.  Ongoing  GOAL:   Patient will meet greater than or equal to 90% of their needs  Progressing  MONITOR:   PO intake, Supplement acceptance, Skin  ASSESSMENT:   66 yo female with PMH of stroke, frequent headaches who was admitted to [facility] on 9/22, treated for SBO (exploratory laparotomy), pelvic abscess (percutaneous drain placed), and fungemia. She was emergently transferred to [facility] on 10/2 due to arterial blockage of bilateral LE; S/P thrombectomy and fasciotomy 10/2.  S/P eye surgery to repair retinal detachment on 10/16. Patient says she is eating well, but can't see her lunch tray very well today with the patch on her right eye. Lunch tray at bedside < 50% complete. She prefers chocolate Boost supplement to Ensure Enlive. Even though Boost causes her diarrhea, she still drinks it because she loves it.  Labs and medications reviewed.  Diet Order:  Diet Heart Room service appropriate? Yes; Fluid consistency: Thin  Skin:   (stage II sacrum, mult surg incisions; L leg BKA; VAC to leg)  Last BM:  10/16  Height:   Ht Readings from Last 1 Encounters:  07/11/17 5\' 2"  (1.575 m)    Weight:   Wt Readings from Last 1 Encounters:  07/11/17 141 lb (64 kg)    Ideal Body Weight:  50 kg  BMI:  Body mass index is 25.79 kg/m.  Estimated Nutritional Needs:   Kcal:  1600-1800  Protein:  100-120 gm  Fluid:  1.8 L  EDUCATION NEEDS:   No education needs identified at this time  Molli Barrows, [place], [place], [place] Pager [phone] After Hours Pager [phone]

## 2017-07-12 NOTE — Progress Notes (Signed)
I met with pt at bedside and contacted her daughter by phone. I await medical clearance by Acute MD to admit pt to inpt rehab today. RN to contact Dr. Sharol Given concerning removal of AKA incisional Wound VAC if appropriate. I will contact Dr. Tyrell Antonio and f/u today to make arrangements to admit. RN is aware of plan. [phone]

## 2017-07-12 NOTE — Discharge Summary (Addendum)
Physician Discharge Summary  Machaela Caterino FTD:[id number] DOB: 1951-05-21 DOA: 06/27/2017  PCP: Rochel Brome, MD  Admit date: 06/27/2017 Discharge date: 07/12/2017  Admitted From: Home  Disposition: transfer to CIR  Recommendations for Outpatient Follow-up:  1. Follow up with PCP in 1-2 weeks 2. Please order IV fluconazole or follow ID recommendation for fungemia.  3. Follow with sx for intra-abdominal infection nd abdominal wall wound     Discharge Condition: stable.  CODE STATUS: Full code.  Diet recommendation: Heart Healthy   Brief/Interim Summary: 66 year old woman presented with abdominal pain and was admitted to [facility] 9/22. Found to have small bowel obstruction. Underwent expiratory laparotomy with lysis of adhesions and release of small bowel obstruction, repair of small bowel perforation. Subsequently had fevers, leukocytosis and CT abdomen and pelvis 9/29 revealed pelvic abscess and underwent successful placement of percutaneous drain October 1. Also noted to have fungemia on 1/2 blood culture 9/27 and started on antifungal therapy. She subsequently developed left lower stomach pain and discomfort October 1 and underwent CT angiogram which revealed bilateral arterial blockage both lower extremities, was started on heparin and emergently transferred to [facility] where she underwent thrombectomy and fasciotomy October 2.  Since then, vascular surgery has felt that her left foot is nonviable and left BKA has been recommended, but patient is having difficulty making that decision. Palliative medicine was consulted and patient met with them and family today for goals of care discussion.  After this meeting, patient has opted for surgery.  Subjective;  Denies abdominal pain. Came from TEE, no complications reported.   Assessment/Plan: Critical left leg ischemia, status post bilateral femoral embolectomy, angioplasty left posterior tibial artery, fasciotomy left leg  10/2. -VVS managing. Left foot nonviable. Patient now amenable to left BKA. Underwent surgery on 10/10 -repeat cbc tonight. -heparin change to xarelto.   Fungemia with Endophthalmitis; OD--OS. Left Eye with subretinal fungal infection in the temporal macula; -spoke with Dr Posey Pronto. He recommended MRI brain and orbit with and without contrast.  -he will discussed with ID, regarding resuming IV antifungal.  -S/P Intra-vitreal  antifungal injection 10-14. -on IV fluconazole. Follow Dr Baxter Flattery recommedation -Follow ID recommendation for tx.  -TEE negative for vegetation.  -S/P tractional retinal detachment repair with injection of intravitreal voriconazole.   Abdominal abscess, Status post laparotomy, lysis of adhesions and repair of perforated bowel9/24 at  [facility] -per general surgery. Wound VAC. - status post percutaneous drain placement October 1.  -WBC slowly trending down. Continue abx as per ID  -CT done 10/8 notes persistent fluid collections.  -CT abdomen 10-15. Pelvis and lower abdominal rim enhancing fluid collections compatible with abscess are stable to decreased in size. The abscess in the posterior pelvis containing the drainage catheter is decreased in size. No new abscess identified. Increase in size of nonenhancing fluid collection overlying the left femoral vessels, probably postoperative seroma. Surgery recommend to continue with wound vac and wound care for abdominal wall wound Per ID; continue with antibacterial therapy until abscess is resolved or she has had at least a month of therapy.  Antibiotics change to cefopoxime and flagyl. Follow final recommendation from Dr Baxter Flattery.  IR managing the drain.   Fungemia Candida tropicalis -now with endophthalmitis.  -On IV Fluconazole. Please follow ID recommendation, Dr Baxter Flattery to comment on treatment.  -TEE negative for vegetation.  repeated blood culture 10-03 and 10-13 negative.    Hypoalbuminemia.  -diet per  surgery .  -supplements per dietician   anemia, likely multifactorial including  perioperative blood loss. S/p 2 units PRBC 10/2. -Hgb stable.  Continue with   nu iron   Obesity unspecified: meets criteria BMI greater than 30  Status post AKI: Now resolved  PMH stroke with left upper and left lower extremity weakness, left facial droop  Pressure Injury, stage II, Buttocks, local care.   Discharge Diagnoses:  Principal Problem:   Critical lower limb ischemia Active Problems:   PAD (peripheral artery disease) (HCC)   Stroke (HCC)   SBO (small bowel obstruction) (HCC)   Intraabdominal fluid collection absess   Candidemia (HCC)   Abdominal pain   History of CVA with residual deficit   Tobacco abuse   Acute blood loss anemia   Post-operative pain   Leukocytosis   Ischemic neuropathy of left foot   DNR (do not resuscitate) discussion   Palliative care by specialist   Pressure injury of skin   Pelvic abscess in female [facility])   Pelvic abscess in female   Fungal endophthalmitis   Intra-abdominal abscess [facility])    Discharge Instructions   Allergies as of 07/12/2017      Reactions   Latex Other (See Comments)   Blisters   Codeine    UNSPECIFIED REACTION   Penicillins Hives, Swelling      Medication List    STOP taking these medications   betamethasone dipropionate 0.05 % cream Commonly known as:  DIPROLENE     TAKE these medications   amLODipine 5 MG tablet Commonly known as:  NORVASC Take 5 mg by mouth daily.   atenolol 50 MG tablet Commonly known as:  TENORMIN Take 25 mg by mouth daily.   butalbital-acetaminophen-caffeine 50-325-40 MG tablet Commonly known as:  FIORICET, ESGIC Take 2 tablets by mouth every 6 (six) hours as needed for headache or migraine.   cefpodoxime 200 MG tablet Commonly known as:  VANTIN Take 1 tablet (200 mg total) by mouth every 12 (twelve) hours.   dicyclomine 10 MG capsule Commonly known as:  BENTYL Take 10 mg by mouth 3  (three) times daily before meals.   furosemide 20 MG tablet Commonly known as:  LASIX Take 20 mg by mouth daily.   gabapentin 300 MG capsule Commonly known as:  NEURONTIN Take 300 mg by mouth 3 (three) times daily.   levothyroxine 25 MCG tablet Commonly known as:  SYNTHROID, LEVOTHROID Take 25 mcg by mouth daily.   lisinopril 10 MG tablet Commonly known as:  PRINIVIL,ZESTRIL Take 10 mg by mouth daily.   metroNIDAZOLE 500 MG tablet Commonly known as:  FLAGYL Take 1 tablet (500 mg total) by mouth every 8 (eight) hours.   omeprazole 40 MG capsule Commonly known as:  PRILOSEC Take 40 mg by mouth daily.   rivaroxaban 20 MG Tabs tablet Commonly known as:  XARELTO Take 1 tablet (20 mg total) by mouth daily with supper.   simvastatin 40 MG tablet Commonly known as:  ZOCOR Take 40 mg by mouth daily.      Follow-up Information    Lininger, Juanda Bond., MD Follow up.   Specialty:  Surgery Why:  Call and follow up once you have left the hospital.  Contact information: [address] [place] [address] [phone]          Allergies  Allergen Reactions  . Latex Other (See Comments)    Blisters  . Codeine     UNSPECIFIED REACTION   . Penicillins Hives and Swelling    Consultations:  ID  Vascular  General surgery  Procedures/Studies: Mr Jeri Cos ON Contrast  Result Date: 07/08/2017 CLINICAL DATA:  Functional diarrhea. Fungemia. 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 EXAM: MRI HEAD AND ORBITS WITHOUT AND WITH CONTRAST TECHNIQUE: Multiplanar, multiecho pulse sequences of the brain and surrounding structures were obtained without and with intravenous contrast. Multiplanar, multiecho pulse sequences of the orbits and surrounding structures were obtained including fat saturation techniques, before and after intravenous contrast administration. CONTRAST:  89m MULTIHANCE GADOBENATE DIMEGLUMINE 529 MG/ML IV SOLN COMPARISON:  Head CT 02/24/2011 FINDINGS: MRI  HEAD FINDINGS Brain: Large area of encephalomalacia affecting the right insula posterior frontal lobe extending from cortex to the ependyma. Mild involvement of the superior right temporal lobe. Findings consistent with remote MCA branch infarct. There is mild associated hemosiderin staining. Wallerian atrophy seen into the right brainstem. No evidence of intracranial infection. No infarct, evidence of sulcal debris, hydrocephalus, or masslike area. No abnormal intracranial enhancement. Vascular: Major flow voids are preserved. Dural venous sinuses have symmetric unremarkable flow. Skull and upper cervical spine: Negative for marrow lesion. MRI ORBITS FINDINGS Orbits: History of endophthalmitis. No abnormal or asymmetric vitreous signal. The globes have a normal shape. No asymmetric or suspected scleral thickening or avid enhancement. On coronal postcontrast imaging there is mildly prominent enhancement at the level of the optic sheaths, but this enhancement appears less prominent and more discontinuous on coronal postcontrast imaging, doubt true dural thickening. No abnormal signal within the optic nerves themselves. No orbital fat inflammation. Normal appearance of the extraocular muscles and lacrimal glands. Visualized sinuses: Clear Soft tissues: Negative IMPRESSION: 1. History of endophthalmitis which is not apparent by imaging. No noted scleral or periscleral inflammation. 2. No evidence of intracranial infection. 3. Remote right MCA branch infarct. Electronically Signed   By: JMonte FantasiaM.D.   On: 07/08/2017 10:23   Ct Abdomen Pelvis W Contrast  Result Date: 07/10/2017 CLINICAL DATA:  66y/o  F; evaluate abscess drain. EXAM: CT ABDOMEN AND PELVIS WITH CONTRAST TECHNIQUE: Multidetector CT imaging of the abdomen and pelvis was performed using the standard protocol following bolus administration of intravenous contrast. CONTRAST:  1013mISOVUE-300 IOPAMIDOL (ISOVUE-300) INJECTION 61% COMPARISON:   07/03/2017 CT abdomen and pelvis. FINDINGS: Lower chest: No acute abnormality. Hepatobiliary: No focal liver abnormality is seen. No gallstones, gallbladder wall thickening, or biliary dilatation. Pancreas: Unremarkable. No pancreatic ductal dilatation or surrounding inflammatory changes. Spleen: Normal in size without focal abnormality. Adrenals/Urinary Tract: Stable nodularity of left adrenal gland. Stable right kidney upper pole cyst measuring 20 HU. No hydronephrosis or urinary stone disease. Normal bladder. Stomach/Bowel: Normal stomach. Interval increase proximal distention of small bowel. Colon is unremarkable. Vascular/Lymphatic: Aortic atherosclerosis. No enlarged abdominal or pelvic lymph nodes. Reproductive: Status post hysterectomy. No adnexal masses. Other: Open midline lower abdominal wound. Decreased size of rim enhancing collection deep to the wound at the peritoneal margin measuring 18 x 51 mm (AP by ML series 4 : Image 61). Stable rim enhancing collection in the right hemipelvis measuring 18 mm (4:73). Decreased size of posterior pelvis rim enhancing collection containing pigtail catheter measuring 14 x 31 mm (AP by ML 4:73). Fluid collection overlying the left femoral vessels without enhancement is increased in size measuring 37 x 43 mm (AP by ML 4:85). Stable adjacent air fluid nonenhancing collection in subcutaneous fat. Musculoskeletal: No fracture is seen. IMPRESSION: 1. Pelvis and lower abdominal rim enhancing fluid collections compatible with abscess are stable to decreased in size. The abscess in the posterior pelvis containing the drainage catheter is decreased in size. No  new abscess identified. 2. Increase in size of nonenhancing fluid collection overlying the left femoral vessels, probably postoperative seroma. 3. Increased dilatation of proximal small bowel compatible with obstruction. Electronically Signed   By: Kristine Garbe M.D.   On: 07/10/2017 02:00   Ct Abdomen  Pelvis W Contrast  Addendum Date: 07/03/2017   ADDENDUM REPORT: 07/03/2017 14:20 ADDENDUM: Small amount of fluid around the right common femoral vessels compatible with recent surgery. No significant lymph node enlargement in the abdomen or pelvis. Electronically Signed   By: Markus Daft M.D.   On: 07/03/2017 14:20   Result Date: 07/03/2017 CLINICAL DATA:  Abdominal pain and fever. History of pelvic fluid collection and status post percutaneous drain. EXAM: CT ABDOMEN AND PELVIS WITH CONTRAST TECHNIQUE: Multidetector CT imaging of the abdomen and pelvis was performed using the standard protocol following bolus administration of intravenous contrast. CONTRAST:  137m ISOVUE-300 IOPAMIDOL (ISOVUE-300) INJECTION 61% COMPARISON:  06/24/2017 and 06/26/2017 FINDINGS: Lower chest: Mild atelectasis at the lung bases. No pleural effusions. Coronary artery calcifications. Hepatobiliary: Normal appearance of the liver, gallbladder and portal venous system. No biliary dilatation. Pancreas: Normal appearance of the pancreas without inflammation or duct dilatation. Spleen: Normal appearance of spleen without enlargement. Adrenals/Urinary Tract: Stable nodularity in the left adrenal gland. Right adrenal tissue is unremarkable. Low-density structure in the right kidney upper pole likely represents a cyst. This cyst is slightly more dense than would be expected but no significantly change in Hounsfield units on the delayed imaging. No hydronephrosis. Small focus of gas within the urinary bladder. Stomach/Bowel: Normal appearance of stomach. Decreased distension of small bowel loops containing fluid in left abdomen. No evidence for a high-grade bowel obstruction. Evidence for an appendectomy. Vascular/Lymphatic: Atherosclerotic calcifications in the aorta and iliac arteries without aneurysm. There appears to be blood flow in the main visceral arteries. Portal venous system and main mesenteric veins are patent. IVC and renal veins  are patent. Iliac veins appear to be patent. Reproductive: Uterus has been removed. Again noted is a small cyst or follicle in the right ovary. Pelvic fluid collection is adjacent to left adnexa. Other: Again noted is a right transgluteal pigtail drain within a small pelvic fluid collection. The pelvic fluid collection has decreased in size measuring 2.6 x 5.2 cm and previously measured 3.3 x 8.0 cm. This collection is adjacent to the left adnexa. Again noted is a fluid collection in the anterior lower abdomen which measures 7.4 x 2.9 cm on sequence 3, image 60, previously measured 10.0 x 3.4 cm. The anterior abdominal incision has been opened and the patient now has a wound VAC in this area. There is extensive edematous tissue or fat between the wound VAC and this anterior fluid collection. Again noted is a small amount of fluid just anterior to the left psoas muscle on sequence 3, image 58. Again noted is a small amount of soft tissue gas along the anterior abdominal incision on sequence 3, image 44. There is a fluid collection along the right side of lower abdomen on sequence 3, image 69 that could be associated with bowel. Musculoskeletal: No acute bone abnormality. There is fluid collection just anterior to the left common femoral vessels on sequence 3, image 84. This collection measures 3.4 x 2.7 cm and compatible with recent surgery. There is a small air-fluid collection just below the skin surface in the left inguinal region which is likely postoperative as well. Subcutaneous edema in the abdomen and pelvis. IMPRESSION: Pelvic abscess collection containing a  percutaneous drain has decreased in size but there is residual fluid in this collection. There is a residual fluid collection or abscess in the anterior lower abdomen that has decreased in size. Postsurgical changes with a wound VAC in the anterior lower abdomen. Probable right renal cyst. Small amount of gas in the urinary bladder that could be  iatrogenic. Electronically Signed: By: Markus Daft M.D. On: 07/03/2017 13:46   Dg Abd Portable 1v  Result Date: 06/28/2017 CLINICAL DATA:  Abdominal pain and nausea. EXAM: PORTABLE ABDOMEN - 1 VIEW COMPARISON:  None. FINDINGS: The bowel gas pattern is normal. No radio-opaque calculi or other significant radiographic abnormality are seen. Pigtail drainage catheter seen in the pelvis. Enteric contrast from prior CT. Degenerative change both hips. IMPRESSION: No obstruction or visible free air. Electronically Signed   By: Staci Righter M.D.   On: 06/28/2017 13:47   Mr Rosealee Albee JJ Contrast  Result Date: 07/08/2017 CLINICAL DATA:  Functional diarrhea. Fungemia. 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 EXAM: MRI HEAD AND ORBITS WITHOUT AND WITH CONTRAST TECHNIQUE: Multiplanar, multiecho pulse sequences of the brain and surrounding structures were obtained without and with intravenous contrast. Multiplanar, multiecho pulse sequences of the orbits and surrounding structures were obtained including fat saturation techniques, before and after intravenous contrast administration. CONTRAST:  15m MULTIHANCE GADOBENATE DIMEGLUMINE 529 MG/ML IV SOLN COMPARISON:  Head CT 02/24/2011 FINDINGS: MRI HEAD FINDINGS Brain: Large area of encephalomalacia affecting the right insula posterior frontal lobe extending from cortex to the ependyma. Mild involvement of the superior right temporal lobe. Findings consistent with remote MCA branch infarct. There is mild associated hemosiderin staining. Wallerian atrophy seen into the right brainstem. No evidence of intracranial infection. No infarct, evidence of sulcal debris, hydrocephalus, or masslike area. No abnormal intracranial enhancement. Vascular: Major flow voids are preserved. Dural venous sinuses have symmetric unremarkable flow. Skull and upper cervical spine: Negative for marrow lesion. MRI ORBITS FINDINGS Orbits: History of endophthalmitis. No abnormal or asymmetric vitreous  signal. The globes have a normal shape. No asymmetric or suspected scleral thickening or avid enhancement. On coronal postcontrast imaging there is mildly prominent enhancement at the level of the optic sheaths, but this enhancement appears less prominent and more discontinuous on coronal postcontrast imaging, doubt true dural thickening. No abnormal signal within the optic nerves themselves. No orbital fat inflammation. Normal appearance of the extraocular muscles and lacrimal glands. Visualized sinuses: Clear Soft tissues: Negative IMPRESSION: 1. History of endophthalmitis which is not apparent by imaging. No noted scleral or periscleral inflammation. 2. No evidence of intracranial infection. 3. Remote right MCA branch infarct. Electronically Signed   By: JMonte FantasiaM.D.   On: 07/08/2017 10:23     Subjective: Tolerating diet.   Discharge Exam: Vitals:   07/12/17 0811 07/12/17 1200  BP: 124/68   Pulse: 95 98  Resp: 16 14  Temp:  98.4 F (36.9 C)  SpO2: 97% 100%   Vitals:   07/12/17 0400 07/12/17 0800 07/12/17 0811 07/12/17 1200  BP: 110/61 (!) 120/100 124/68   Pulse: 86 95 95 98  Resp: _0 Temp: 98.9 F (37.2 C)   98.4 F (36.9 C)  TempSrc: Oral   Oral  SpO2: 97% 100% 97% 100%  Weight:      Height:        General: Pt is alert, awake, not in acute distress Cardiovascular: RRR, S1/S2 +, no rubs, no gallops Respiratory: CTA bilaterally, no wheezing, no rhonchi Abdominal: Soft, NT, ND, bowel sounds +  wound vac in place.  Extremities: no edema, no cyanosis Left AKA    The results of significant diagnostics from this hospitalization (including imaging, microbiology, ancillary and laboratory) are listed below for reference.     Microbiology: Recent Results (from the past 240 hour(s))  MRSA PCR Screening     Status: None   Collection Time: 07/05/17  5:58 AM  Result Value Ref Range Status   MRSA by PCR NEGATIVE NEGATIVE Final    Comment:        The GeneXpert  MRSA Assay (FDA approved for NASAL specimens only), is one component of a comprehensive MRSA colonization surveillance program. It is not intended to diagnose MRSA infection nor to guide or monitor treatment for MRSA infections.   Culture, blood (Routine X 2) w Reflex to ID Panel     Status: None (Preliminary result)   Collection Time: 07/08/17 12:03 PM  Result Value Ref Range Status   Specimen Description BLOOD RIGHT HAND  Final   Special Requests   Final    BOTTLES DRAWN AEROBIC AND ANAEROBIC Blood Culture adequate volume   Culture NO GROWTH 3 DAYS  Final   Report Status PENDING  Incomplete  Culture, blood (Routine X 2) w Reflex to ID Panel     Status: None (Preliminary result)   Collection Time: 07/08/17 12:04 PM  Result Value Ref Range Status   Specimen Description BLOOD LEFT HAND  Final   Special Requests   Final    BOTTLES DRAWN AEROBIC AND ANAEROBIC Blood Culture results may not be optimal due to an excessive volume of blood received in culture bottles   Culture NO GROWTH 3 DAYS  Final   Report Status PENDING  Incomplete     Labs: BNP (last 3 results) No results for input(s): BNP in the last 8760 hours. Basic Metabolic Panel:  Recent Labs Lab 07/06/17 0808 07/07/17 0905 07/08/17 0328 07/10/17 1459 07/11/17 0341  NA 136 138 137 136 136  K 4.1 3.5 3.7 3.5 3.5  CL 101 105 100* 102 103  CO2 _0 GLUCOSE 111* 84 89 112* 101*  BUN _1 <5* <5*  CREATININE 0.68 0.72 0.73 0.77 0.66  CALCIUM 7.6* 7.7* 7.9* 8.0* 8.2*   Liver Function Tests: No results for input(s): AST, ALT, ALKPHOS, BILITOT, PROT, ALBUMIN in the last 168 hours. No results for input(s): LIPASE, AMYLASE in the last 168 hours. No results for input(s): AMMONIA in the last 168 hours. CBC:  Recent Labs Lab 07/08/17 0328 07/09/17 0230 07/10/17 0241 07/11/17 0341 07/12/17 0303  WBC 12.8* 11.4* 10.9* 9.8 8.2  HGB 8.2* 7.9* 8.2* 8.6* 8.6*  HCT 26.8* 25.7* 26.3* 27.1* 27.5*  MCV 78.8  78.8 78.7 79.5 79.3  PLT 404* 426* 436* 463* 370   Cardiac Enzymes: No results for input(s): CKTOTAL, CKMB, CKMBINDEX, TROPONINI in the last 168 hours. BNP: Invalid input(s): POCBNP CBG: No results for input(s): GLUCAP in the last 168 hours. D-Dimer No results for input(s): DDIMER in the last 72 hours. Hgb A1c No results for input(s): HGBA1C in the last 72 hours. Lipid Profile No results for input(s): CHOL, HDL, LDLCALC, TRIG, CHOLHDL, LDLDIRECT in the last 72 hours. Thyroid function studies No results for input(s): TSH, T4TOTAL, T3FREE, THYROIDAB in the last 72 hours.  Invalid input(s): FREET3 Anemia work up No results for input(s): VITAMINB12, FOLATE, FERRITIN, TIBC, IRON, RETICCTPCT in the last 72 hours. Urinalysis No results found for: COLORURINE, APPEARANCEUR, [place], [place], [place], [place], [place],  [place] Stain, PROTEINUR, UROBILINOGEN, NITRITE, LEUKOCYTESUR Sepsis Labs Invalid input(s): PROCALCITONIN,  WBC,  LACTICIDVEN Microbiology Recent Results (from the past 240 hour(s))  MRSA PCR Screening     Status: None   Collection Time: 07/05/17  5:58 AM  Result Value Ref Range Status   MRSA by PCR NEGATIVE NEGATIVE Final    Comment:        The GeneXpert MRSA Assay (FDA approved for NASAL specimens only), is one component of a comprehensive MRSA colonization surveillance program. It is not intended to diagnose MRSA infection nor to guide or monitor treatment for MRSA infections.   Culture, blood (Routine X 2) w Reflex to ID Panel     Status: None (Preliminary result)   Collection Time: 07/08/17 12:03 PM  Result Value Ref Range Status   Specimen Description BLOOD RIGHT HAND  Final   Special Requests   Final    BOTTLES DRAWN AEROBIC AND ANAEROBIC Blood Culture adequate volume   Culture NO GROWTH 3 DAYS  Final   Report Status PENDING  Incomplete  Culture, blood (Routine X 2) w Reflex to ID Panel     Status: None (Preliminary result)   Collection Time: 07/08/17  12:04 PM  Result Value Ref Range Status   Specimen Description BLOOD LEFT HAND  Final   Special Requests   Final    BOTTLES DRAWN AEROBIC AND ANAEROBIC Blood Culture results may not be optimal due to an excessive volume of blood received in culture bottles   Culture NO GROWTH 3 DAYS  Final   Report Status PENDING  Incomplete     Time coordinating discharge: Over 30 minutes  SIGNED:   Elmarie Shiley, MD  [facility] 07/12/2017, 1:23 PM Pager [phone]  If 7PM-7AM, please contact night-coverage [email or web address] Password [email or web address]

## 2017-07-12 NOTE — Progress Notes (Signed)
ANTICOAGULATION CONSULT NOTE - Follow Up Consult  Pharmacy Consult for Heparin Indication: LE ischemia  Patient Measurements: Height: 5\' 2"  (157.5 cm) Weight: 141 lb (64 kg) IBW/kg (Calculated) : 50.1 Heparin Dosing Weight: 69 kg  Labs:  Recent Labs  07/10/17 0241 07/10/17 1459 07/11/17 0341 07/12/17 0303  HGB 8.2*  --  8.6* 8.6*  HCT 26.3*  --  27.1* 27.5*  PLT 436*  --  463* 370  LABPROT  --   --  14.5  --   INR  --   --  1.14  --   HEPARINUNFRC 0.57  --  <0.10* <0.10*  CREATININE  --  0.77 0.66  --     Estimated Creatinine Clearance: 59.2 mL/min (by C-G formula based on SCr of 0.66 mg/dL).  Assessment: 66 yo F s/p ex lap with lysis of adhesions and small bowel perforation repair at [facility] on 9/24, admitted with ischemic left limb s/p bilateral femoral popliteal embolectomy and left leg fasciotomy on 10/2, BKA 10/10.   To resume hep per Dr. Posey Pronto s/p eye surgery  Goal of Therapy:  Heparin level 0.3-0.5 units/ml Monitor platelets by anticoagulation protocol: Yes   Plan:  Resume heparin 900 units/hr HL 1700  Daily HL, CBC - plans to change to PO?  Levester Fresh, PharmD, BCPS, BCCCP Clinical Pharmacist Clinical phone for 07/12/2017 from 7a-3:30p: [phone] If after 3:30p, please call main pharmacy at: [phone] 07/12/2017 8:15 AM

## 2017-07-12 NOTE — Consult Note (Addendum)
[facility] Nurse wound consult note  Vascular team following for assessment and plan of care to left leg wound and vac dressing. Reason for Consult: Vac dressing change to abd wound Wound type: Full thickness post-op wound with sutures visible to inner wound bed Wound bed: Appearance unchanged since previous assessment.  90% red and moist, 10% yellow slough to wound edges, decreasing amt of undermining to wound edges.  12X7X1cm with 2 cm undermining to lower wound edges from 5:00 o'clock to 7:00 o'clock Drainage (amount, consistency, odor) modamt tandrainage in the cannister, no odor Periwound: Intact skin surrounding Dressing procedure/placement/frequency: Applied Mepitel contact layer, then one half piece white foam, then one piece black foam. Pt medicated for pain prior to the procedure and tolerated with mod amt discomfort. WOC will plan to change abd Vac dressing Q M/W/F. Discussed plan of care and pt verbalized understanding. Julien Girt MSN, RN, [place], [place], [place]

## 2017-07-12 NOTE — Progress Notes (Signed)
Physical Therapy Treatment Patient Details Name: Anna Hayes MRN: [id number] DOB: 12-Sep-1951 Today's Date: 07/12/2017    History of Present Illness Pt is a 66 y.o. female with medical history significant of CVA with left arm weakness present to [place] with a sbo after experincing symptoms for ten days.she went to OR on 9/24 had lysis of adhesions and repair of perforated bowel.  Post op she developed AKI with peak cr of 2.1 this stabalized with ivf.  On 9/27 she started developing some fevers and worsening leukocytosis.  ble u/s was done which was neg for DVT.  Ct scan of abd/pelvis showed a developing absess which was drained by IR on 10/1.  She was started on levaquin /flagyl on 9/27.  Blood cultures from 9/27 also growing out fungus therefore casopofugin was started on 10/1 after their doctor consulted with [facility].  On the evening around 9/30 she then developed an ischemic left limb.  A stat cta with run off was performed with showed bilateral occlusion to both legs.  Pt was then arranged to be transferred here to get emergent vascular surgery which she has gotten s/p thrombectomy of bilateral vessels(see vascular report) and left leg fasciotomy. s/p L AKA 07/05/17    PT Comments    Patient continues to be eager to participate in therapy. Pt required min/mod A +2 for functional transfers. Pt continues to be a good candidate for CIR level therapies.    Follow Up Recommendations  CIR     Equipment Recommendations  Other (comment) (to be determined at next venue)    Recommendations for Other Services Rehab consult     Precautions / Restrictions Restrictions Weight Bearing Restrictions: Yes LLE Weight Bearing: Non weight bearing    Mobility  Bed Mobility Overal bed mobility: Needs Assistance Bed Mobility: Supine to Sit     Supine to sit: HOB elevated;Mod assist     General bed mobility comments: assist to elevate trunk into sitting and to scoot hips toward EOB with use  of bed pad; cues for technique; use of rail   Transfers Overall transfer level: Needs assistance Equipment used: 2 person hand held assist Transfers: Stand Pivot Transfers;Sit to/from Stand Sit to Stand: Mod assist;+2 physical assistance Stand pivot transfers: Min assist;+2 physical assistance       General transfer comment: cues for R foot placement and R hand placement; therapist blocked R foot to decrease risk of sliding; assist to power up and to gain balance upon stand and then for balance when pivoting R foot   Ambulation/Gait                 Stairs            Wheelchair Mobility    Modified Rankin (Stroke Patients Only)       Balance Overall balance assessment: Needs assistance Sitting-balance support: Feet supported Sitting balance-Leahy Scale: Fair       Standing balance-Leahy Scale: Poor                              Cognition Arousal/Alertness: Awake/alert Behavior During Therapy: WFL for tasks assessed/performed Overall Cognitive Status: Within Functional Limits for tasks assessed                                        Exercises      General  Comments General comments (skin integrity, edema, etc.): bandage over R eye      Pertinent Vitals/Pain Pain Assessment: Faces Faces Pain Scale: Hurts a little bit Pain Location: abdomen; L residual limb Pain Descriptors / Indicators: Sore Pain Intervention(s): Limited activity within patient's tolerance;Repositioned    Home Living                      Prior Function            PT Goals (current goals can now be found in the care plan section) Acute Rehab PT Goals PT Goal Formulation: With patient Time For Goal Achievement: 07/12/17 Potential to Achieve Goals: Good Progress towards PT goals: Progressing toward goals    Frequency    Min 3X/week      PT Plan Current plan remains appropriate    Co-evaluation              AM-PAC PT "6  Clicks" Daily Activity  Outcome Measure  Difficulty turning over in bed (including adjusting bedclothes, sheets and blankets)?: Unable Difficulty moving from lying on back to sitting on the side of the bed? : Unable Difficulty sitting down on and standing up from a chair with arms (e.g., wheelchair, bedside commode, etc,.)?: Unable Help needed moving to and from a bed to chair (including a wheelchair)?: A Lot Help needed walking in hospital room?: Total Help needed climbing 3-5 steps with a railing? : Total 6 Click Score: 7    End of Session Equipment Utilized During Treatment: Gait belt Activity Tolerance: Patient tolerated treatment well Patient left: in chair;with call bell/phone within reach;with chair alarm set Nurse Communication: Mobility status PT Visit Diagnosis: Unsteadiness on feet (R26.81);Other abnormalities of gait and mobility (R26.89);Muscle weakness (generalized) (M62.81);Difficulty in walking, not elsewhere classified (R26.2);Hemiplegia and hemiparesis;Pain Hemiplegia - Right/Left: Left Hemiplegia - dominant/non-dominant: Non-dominant Hemiplegia - caused by: Cerebral infarction Pain - Right/Left: Left Pain - part of body: Ankle and joints of foot;Leg     Time: 6789-3810 PT Time Calculation (min) (ACUTE ONLY): 27 min  Charges:  $Therapeutic Activity: 23-37 mins                    G Codes:       Earney Navy, PTA Pager: [phone]     Darliss Cheney 07/12/2017, 10:52 AM

## 2017-07-13 ENCOUNTER — Inpatient Hospital Stay (HOSPITAL_COMMUNITY): Payer: Medicare Other | Admitting: Occupational Therapy

## 2017-07-13 ENCOUNTER — Inpatient Hospital Stay (HOSPITAL_COMMUNITY): Payer: Medicare Other

## 2017-07-13 DIAGNOSIS — S78112A Complete traumatic amputation at level between left hip and knee, initial encounter: Secondary | ICD-10-CM

## 2017-07-13 LAB — CBC WITH DIFFERENTIAL/PLATELET
Basophils Absolute: 0 10*3/uL (ref 0.0–0.1)
Basophils Relative: 0 %
EOS PCT: 1 %
Eosinophils Absolute: 0.1 10*3/uL (ref 0.0–0.7)
HEMATOCRIT: 28.8 % — AB (ref 36.0–46.0)
Hemoglobin: 9 g/dL — ABNORMAL LOW (ref 12.0–15.0)
LYMPHS ABS: 2.6 10*3/uL (ref 0.7–4.0)
LYMPHS PCT: 29 %
MCH: 25 pg — AB (ref 26.0–34.0)
MCHC: 31.3 g/dL (ref 30.0–36.0)
MCV: 80 fL (ref 78.0–100.0)
MONO ABS: 0.9 10*3/uL (ref 0.1–1.0)
MONOS PCT: 10 %
NEUTROS ABS: 5.4 10*3/uL (ref 1.7–7.7)
Neutrophils Relative %: 60 %
PLATELETS: 330 10*3/uL (ref 150–400)
RBC: 3.6 MIL/uL — ABNORMAL LOW (ref 3.87–5.11)
RDW: 21 % — ABNORMAL HIGH (ref 11.5–15.5)
WBC: 9 10*3/uL (ref 4.0–10.5)

## 2017-07-13 LAB — CULTURE, BLOOD (ROUTINE X 2)
CULTURE: NO GROWTH
Culture: NO GROWTH
SPECIAL REQUESTS: ADEQUATE

## 2017-07-13 LAB — COMPREHENSIVE METABOLIC PANEL
ALT: 12 U/L — ABNORMAL LOW (ref 14–54)
ANION GAP: 11 (ref 5–15)
AST: 21 U/L (ref 15–41)
Albumin: 2.5 g/dL — ABNORMAL LOW (ref 3.5–5.0)
Alkaline Phosphatase: 57 U/L (ref 38–126)
BILIRUBIN TOTAL: 0.3 mg/dL (ref 0.3–1.2)
BUN: 7 mg/dL (ref 6–20)
CO2: 22 mmol/L (ref 22–32)
Calcium: 8.3 mg/dL — ABNORMAL LOW (ref 8.9–10.3)
Chloride: 104 mmol/L (ref 101–111)
Creatinine, Ser: 0.65 mg/dL (ref 0.44–1.00)
Glucose, Bld: 87 mg/dL (ref 65–99)
POTASSIUM: 3.3 mmol/L — AB (ref 3.5–5.1)
Sodium: 137 mmol/L (ref 135–145)
TOTAL PROTEIN: 6.9 g/dL (ref 6.5–8.1)

## 2017-07-13 MED ORDER — OXYCODONE-ACETAMINOPHEN 5-325 MG PO TABS: 1.0000 | ORAL_TABLET | Freq: Once | ORAL | Status: DC

## 2017-07-13 MED ORDER — COLLAGENASE 250 UNIT/GM EX OINT
TOPICAL_OINTMENT | Freq: Every day | CUTANEOUS | Status: DC
  Administered 2017-07-13 – 2017-07-18 (×6): via TOPICAL
  Administered 2017-07-18: 1 via TOPICAL
  Administered 2017-07-19 – 2017-07-28 (×11): via TOPICAL
  Filled 2017-07-13: qty 30

## 2017-07-13 MED FILL — Voriconazole For Inj 200 MG: INTRAVENOUS | Qty: 200 | Status: AC

## 2017-07-13 NOTE — Progress Notes (Signed)
Patient has an unstagable ulcer to the coccyx that measures 1.2X2X0.4. The wound bed can not be seen & is covered in soft necrotic tissue/slough. There is slight redness to the inferior border, no drainage noted. No c/o pain to the area when touched. She also has 2 abrasions, 1 to the right upper buttock and 1 to the left lower buttock. The one to the right shows signs of healing & has a skin flap. The one to the left is open with non-attached edges, has reddened wound base & is blanchable. Looks like it could have been caused by shearing forces. Patient is on an air mattress. Sheets were removed & a dry flow pad placed beneath her, brief removed to allow air mattress to be therapeutic. Pure wick device was being place for incontinence & to keep areas dry. Orders for wound care pending. Patients nurse present at time of assessment. Foam dressings placed on wounds until orders placed by physician or wound care team. Bethena Midget RN, WTA

## 2017-07-13 NOTE — Consult Note (Addendum)
WOC re-consult: WOC has been previously following for ABD wound.  Requested today to assess a sacrum wound. Sacrum with unstageable pressure injury; 1X.8cm, 100% tightly adhered slough, small amt yellow drainage, no odor Left buttock with partial thickness wound; appearance consistent with shear; .8X.8X.1cm, pink and dry with shaggy, irregular edges. No odor or drainage. Pt states she has been using a lift to assist with getting OOB. Right buttock partial thickness wound with the same appearance; 2X1X.1cm Pt is on a low-airloss bed to reduce pressure.  Foam dressings in place to bilat buttocks to protect and promote healing.  Santyl ointment to provide enzymatic debridement of nonviable tissue to sacrum. Discussed plan of care with patient and she verbalized understanding. Julien Girt MSN, RN, [place], [place], [place]

## 2017-07-13 NOTE — Discharge Instructions (Addendum)
Inpatient Rehab Discharge Instructions  Anna Hayes Discharge date and time: No discharge date for patient encounter.   Activities/Precautions/ Functional Status: Activity: activity as tolerated Diet: regular diet Wound Care: none needed Functional status:  ___ No restrictions     ___ Walk up steps independently ___ 24/7 supervision/assistance   ___ Walk up steps with assistance ___ Intermittent supervision/assistance  ___ Bathe/dress independently ___ Walk with walker     _x__ Bathe/dress with assistance ___ Walk Independently    ___ Shower independently ___ Walk with assistance    ___ Shower with assistance ___ No alcohol     ___ Return to work/school ________    COMMUNITY REFERRALS UPON DISCHARGE:    Home Health:   PT     OT     RN                         Agency:  [facility] Phone: [phone]   Medical Equipment/Items Ordered: wheelchair, cushion, hospital bed, drop arm commode                                                     Agency/Supplier:  [facility] @ [phone]   GENERAL COMMUNITY RESOURCES FOR PATIENT/FAMILY:  Support Groups: Amputee Support Group  (see flyer)      Special Instructions: Continue fluconazole 800 mg daily until 08/09/2017 and stop  Continue Vantin 200 mg every 12 hours and Flagyl 500 mg every 8 hours until 08/09/2017 and stop   Home health nurse for wound VAC changes to the abdomen Monday Wednesday Friday.SANTYL ointment daily to sacrum and buttocks  Follow-up with Dr. Bridgett Larsson vascular surgery for removal of staples from stump     My questions have been answered and I understand these instructions. I will adhere to these goals and the provided educational materials after my discharge from the hospital.  Patient/Caregiver Signature _______________________________ Date __________  Clinician Signature _______________________________________ Date __________  Please bring this form and your medication list with you to  all your follow-up doctor's appointments. Information on my medicine - XARELTO (rivaroxaban)  This medication education was reviewed with me or my healthcare representative as part of my discharge preparation.    WHY WAS XARELTO PRESCRIBED FOR YOU? Xarelto was prescribed to treat blood clots that may have been found in the veins of your legs (deep vein thrombosis) or in your lungs (pulmonary embolism) and to reduce the risk of them occurring again.  What do you need to know about Xarelto? The dose is one 20 mg tablet taken ONCE A DAY with your evening meal.  DO NOT stop taking Xarelto without talking to the health care provider who prescribed the medication.  Refill your prescription for 20 mg tablets before you run out.  After discharge, you should have regular check-up appointments with your healthcare provider that is prescribing your Xarelto.  In the future your dose may need to be changed if your kidney function changes by a significant amount.  What do you do if you miss a dose? If you are taking Xarelto TWICE DAILY and you miss a dose, take it as soon as you remember. You may take two 15 mg tablets (total 30 mg) at the same time then resume your regularly scheduled 15 mg twice daily the next day.  If you are taking Xarelto ONCE DAILY and you miss a dose, take it as soon as you remember on the same day then continue your regularly scheduled once daily regimen the next day. Do not take two doses of Xarelto at the same time.   Important Safety Information Xarelto is a blood thinner medicine that can cause bleeding. You should call your healthcare provider right away if you experience any of the following: ? Bleeding from an injury or your nose that does not stop. ? Unusual colored urine (red or dark brown) or unusual colored stools (red or black). ? Unusual bruising for unknown reasons. ? A serious fall or if you hit your head (even if there is no bleeding).  Some medicines  may interact with Xarelto and might increase your risk of bleeding while on Xarelto. To help avoid this, consult your healthcare provider or pharmacist prior to using any new prescription or non-prescription medications, including herbals, vitamins, non-steroidal anti-inflammatory drugs (NSAIDs) and supplements.  This website has more information on Xarelto: [email or web address]

## 2017-07-13 NOTE — Progress Notes (Signed)
Occupational Therapy Session Note  Patient Details  Name: Anna Hayes MRN: [id number] Date of Birth: [birth year]  Today's Date: 07/13/2017 OT Individual Time: 1530-1600 OT Individual Time Calculation (min): 30 min    Short Term Goals: Week 1:  OT Short Term Goal 1 (Week 1): Pt will complete a transfer to toilet with mod A. OT Short Term Goal 2 (Week 1): Pt will don pants from bed level with mod A. OT Short Term Goal 3 (Week 1): pt will be able to lateral lean to doff pants while sitting on toilet with mod A.  Skilled Therapeutic Interventions/Progress Updates:  Pt transitioned easily to OT session but reports extreme fatigue and is supine in bed. Pt declines exiting the bed this session as well as further therapeutic exercise. OT continues to provided education to pt and caregiver, her daughter, regarding OT purpose, POC, and goals while in rehab. Pt verbalized understanding and agreement. OT discussed toileting and demonstrated lateral leans and simulated clothing management for toilet. Pt declined practice this session but drop arm left in room for next session. Pt remained in bed with call bell and all needed items within reach upon exiting the room.   Therapy Documentation Precautions:  Precautions Precautions: Fall Precaution Comments: LUE hemi plegic Restrictions Weight Bearing Restrictions: Yes LLE Weight Bearing: Non weight bearing General:   Vital Signs: Therapy Vitals Temp: 98.5 F (36.9 C) Temp Source: Oral Pulse Rate: (!) 107 Resp: 18 BP: 124/69 Patient Position (if appropriate): Lying Oxygen Therapy SpO2: 100 % O2 Device: Not Delivered Pain: Pain Assessment Pain Assessment: No/denies pain Pain Score: 7  Pain Location: Leg (" all over") Pain Orientation: Left Pain Intervention(s): Medication (See eMAR) ADL: ADL ADL Comments: Refer to functional navigator Vision Baseline Vision/History: Wears glasses Wears Glasses: At all times Patient Visual  Report: Blurring of vision Vision Assessment?: No apparent visual deficits Additional Comments: continues to have "mild" blurry vision per pt Perception  Perception: Within Functional Limits Praxis Praxis: Intact Exercises:   Other Treatments:    See Function Navigator for Current Functional Status.   Therapy/Group: Individual Therapy  Gypsy Decant 07/13/2017, 4:05 PM

## 2017-07-13 NOTE — Plan of Care (Signed)
Problem: RH BOWEL ELIMINATION Goal: RH STG MANAGE BOWEL WITH ASSISTANCE STG Manage Bowel with mod  Assistance.  Outcome: Not Progressing Pt requires bedpan for first night on rehab  Problem: RH SKIN INTEGRITY Goal: RH STG SKIN FREE OF INFECTION/BREAKDOWN With mod assistance Outcome: Not Progressing Pt has breakdown on coccyx and sheer to buttocks, left AKA incision without s/s of infection.  Problem: RH SAFETY Goal: RH STG DECREASED RISK OF FALL WITH ASSISTANCE STG Decreased Risk of Fall With Assistance.  Outcome: Progressing Calls appropriately  Problem: RH PAIN MANAGEMENT Goal: RH STG PAIN MANAGED AT OR BELOW PT'S PAIN GOAL Outcome: Progressing Managed with current medication  Problem: RH KNOWLEDGE DEFICIT LIMB LOSS Goal: RH STG INCREASE KNOWLEDGE OF SELF CARE AFTER LIMB LOSS With min assist Outcome: Not Applicable Date Met: 67/67/20 Preliminary teaching begun, pt has understanding but requires more teaching

## 2017-07-13 NOTE — Progress Notes (Signed)
Occupational Therapy Session Note  Patient Details  Name: Anna Hayes MRN: [id number] Date of Birth: [birth year]  Today's Date: 07/13/2017 OT Individual Time: 1130-1200 OT Individual Time Calculation (min): 30 min    Short Term Goals: Week 1:  OT Short Term Goal 1 (Week 1): Pt will complete a transfer to toilet with mod A. OT Short Term Goal 2 (Week 1): Pt will don pants from bed level with mod A. OT Short Term Goal 3 (Week 1): pt will be able to lateral lean to doff pants while sitting on toilet with mod A.  Skilled Therapeutic Interventions/Progress Updates:  Publishing copy;Functional mobility training;Pain management;Patient/family education;Psychosocial support;Self Care/advanced ADL retraining;Skin care/wound managment;Therapeutic Activities;Therapeutic Exercise;UE/LE Strength taining/ROM;UE/LE Coordination activities   Session Information: Pt seated in w/c upon OT arrival and stated "I'm plumb wore out" following morning ADL.  Pt agreeable to participating in therapy with min encouragement.  Education provided on body positioning in w/c to ensure safety.  Pt tends to sit with L hip posterior to R.  Pt verbalized understanding of information.  Pt completed 4 sit to stands from w/c level with max VCing for body mechanics and head/hip ratio over RLE, however was able to stand with mod A to facilitate.  Rest breaks required d/t fatigue.  Education provided on desensitization strategies to L residual limb, pt demonstrated and verbalized understanding of information discussed.  Pt left seated in w/c with all needs in reach and daughter present upon OT departure.  Therapy Documentation Precautions:  Precautions Precautions: Fall Restrictions Weight Bearing Restrictions: Yes LLE Weight Bearing: Non weight bearing Pain: Pain Assessment Pain Assessment: No/denies pain Pain Score: 6   See Function Navigator for  Current Functional Status.   Therapy/Group: Individual Therapy  Marcella Dubs 07/13/2017, 2:07 PM

## 2017-07-13 NOTE — Evaluation (Signed)
Occupational Therapy Assessment and Plan  Patient Details  Name: Anna Hayes MRN: [id number] Date of Birth: [birth year]  OT Diagnosis: acute pain, flaccid hemiplegia and hemiparesis and muscle weakness (generalized) Rehab Potential: Rehab Potential (ACUTE ONLY): Good ELOS: 12-14 days   Today's Date: 07/13/2017 OT Individual Time: 5093-2671 OT Individual Time Calculation (min): 70 min     Problem List:  Patient Active Problem List   Diagnosis Date Noted  . Amputation of left lower extremity above knee upon examination ([place]) 07/12/2017  . Unilateral AKA, left ([place])   . Neuropathic pain   . Fungemia   . Benign essential HTN   . Slow transit constipation   . Intra-abdominal abscess ([place])   . Fungal endophthalmitis   . Pressure injury of skin 07/06/2017  . Pelvic abscess in female [facility])   . Pelvic abscess in female   . DNR (do not resuscitate) discussion   . Palliative care by specialist   . Ischemic neuropathy of left foot   . Abdominal pain   . History of CVA with residual deficit   . Tobacco abuse   . Acute blood loss anemia   . Post-operative pain   . Leukocytosis   . PAD (peripheral artery disease) ([place]) 06/27/2017  . Critical lower limb ischemia 06/27/2017  . SBO (small bowel obstruction) ([place]) 06/27/2017  . Intraabdominal fluid collection absess 06/27/2017  . Candidemia ([place]) 06/27/2017  . Stroke [facility])     Past Medical History:  Past Medical History:  Diagnosis Date  . Frequent headaches   . Hypertension   . Muscle pain   . Palpitations   . Sinus problem   . Stroke [facility])    Past Surgical History:  Past Surgical History:  Procedure Laterality Date  . AMPUTATION Left 07/05/2017   Procedure: LEFT ABOVE KNEE AMPUTATION;  Surgeon: Conrad Farmingdale, MD;  Location: [place];  Service: Vascular;  Laterality: Left;  . EMBOLECTOMY Bilateral 06/27/2017   Procedure: BILATERAL FEMORAL POPLITEAL EMBOLECTOMY;  Surgeon: Serafina Mitchell, MD;  [facility]: [facility] OR;  Service:  Vascular;  Laterality: Bilateral;  . FASCIOTOMY Left 06/27/2017   Procedure: FULL COMPARTMENT LEFT LOWER LEG FASCIOTOMY;  Surgeon: Serafina Mitchell, MD;  Location: [place];  Service: Vascular;  Laterality: Left;  . PATCH ANGIOPLASTY Left 06/27/2017   Procedure: PATCH ANGIOPLASTY Left Posterior Tibial Artery;  Surgeon: Serafina Mitchell, MD;  Location: [place];  Service: Vascular;  Laterality: Left;  . 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 TEE WITHOUT CARDIOVERSION N/A 07/11/2017   Procedure: TRANSESOPHAGEAL ECHOCARDIOGRAM (TEE);  Surgeon: Acie Fredrickson Wonda Cheng, MD;  Location: [facility] ENDOSCOPY;  Service: Cardiovascular;  Laterality: N/A;    Assessment & Plan Clinical Impression:  Anna Hayes is a 66 y.o. female with history of palpitations, PAD, tobacco use, CVA with residual Left HP. Per chart review patient lives with children. Independent with assistive device prior to admission. One level home with 5 steps to entry. Family assistance is needed. Patient was who was admitted to [facility] with SBO who underwent lysis of adhesions and repair of perforated bowel 06/19/17. History taken from chart review. Hospital course complicated by fevers with leucocytosis due to s/p drain placement 10/1, fungemia and development of ischemic LLE due to occlusion of left popliteal and femoral arteries as well as thrombus  in right femoral artery. She was started on IV heparin and transferred to [facility] on 10/2 due as patient with significant pain with motor and sensory deficits. She was taken to OR for BLE thrombectomy with left 4 compartment fasciotomy by Dr. Donavan Burnet ischemic changes of left lower extremity and underwent left AKA 07/05/2017 per Dr. Bridgett Larsson.Hospital course pain management as well  as acute blood loss anemia 8.2 and monitored. Ophthalmology consulted to 07/07/2017 for suspect fungemis with endophthalamis OD>OS and placed on antifungal agents as well as follow-up per infectious disease. She also remains on Vantin as well as Flagyl to cover bacterial pathogens of abdominal abscess and plan to continue for 1 month and then drop the fluconazole 200 mg daily 2 week course. Underwent Pars Plana Vitrectomy, membrane peeling 07/11/2017 per ophthalmology. A TEE was completed showing no vegetation. Abdominal drain removed 07/11/2017. Patient was transitioned from intravenous heparin to Xarelto. MRI brain 10/13 reviewed, unremarkable for acute process. Therapy evaluations done today revealing difficulty WB on LE with instability RLE and  pain affecting mobility. CIR recommended due to deficits in mobility and ability to carry out ADL tasks.Patient was admitted for a comprehensive rehabilitation program.  Patient transferred to CIR on 07/12/2017 .    Patient currently requires total with basic self-care skills secondary to muscle weakness and muscle paralysis, decreased cardiorespiratoy endurance, abnormal tone and decreased sitting balance, decreased standing balance and hemiplegia.  Prior to hospitalization, patient could complete ADLs with mod I.  Patient will benefit from skilled intervention to increase independence with basic self-care skills prior to discharge home with care partner.  Anticipate patient will require minimal physical assistance and follow up home health.  OT - End of Session Activity Tolerance: Tolerates 10 - 20 min activity with multiple rests Endurance Deficit: Yes OT Assessment Rehab Potential (ACUTE ONLY): Good OT Barriers to Discharge: Inaccessible home environment;Wound Care;Incontinence OT Barriers to Discharge Comments: stairs to enter OT Patient demonstrates impairments in the following area(s): Balance;Pain;Motor;Endurance;Skin Integrity;Safety OT Basic ADL's  Functional Problem(s): Bathing;Dressing;Toileting OT Transfers Functional Problem(s): Toilet;Tub/Shower OT Additional Impairment(s): None OT Plan OT Intensity: Minimum of 1-2 x/day, 45 to 90 minutes OT Frequency: 5 out of 7 days OT Duration/Estimated Length of Stay: 12-14 days OT Treatment/Interventions: Balance/vestibular training;Discharge planning;DME/adaptive equipment instruction;Functional mobility training;Pain management;Patient/family education;Psychosocial support;Self Care/advanced ADL retraining;Skin care/wound managment;Therapeutic Activities;Therapeutic Exercise;UE/LE Strength taining/ROM;UE/LE Coordination activities OT Self Feeding Anticipated Outcome(s): mod I OT Basic Self-Care Anticipated Outcome(s): set up bathing and dressing bed level OT Toileting Anticipated Outcome(s): mod A OT Bathroom Transfers Anticipated Outcome(s): supervision to toilet, min A to tub bench (when wound vac is removed) OT Recommendation Patient destination: Home Follow Up Recommendations: Home health OT Equipment Recommended: To be determined   Skilled Therapeutic Intervention Pt seen for initial evaluation and pt education about OT process, POC.  Pt received in bed and she is very motivated and agreeable to therapy, but has low endurance, pain, and limited options for mobility as she has no functional use of her LUE and she had no left lower limb to weight bear on.  She needed mod-max A with bed mobility to avoid shearing her bottom on the sticky air mattress pad.  She is able to push up to stand with help of her R arm but then needs max A to stabilize as she only has the R arm and leg to support herself.  With this limitation, clothing management in standing is not possible for her to do. She will need to learn to dress from  bed, or learn how to laterally weight shift, or have her caregiver fully assist her once she can learn to balance in standing with S using R arm for support.  Discussed all of  these problems with pt and she fully understands she has challenges ahead and is willing to work on them. Showering is not possible at this time due to wound vac.  Pt resting in w/c with all needs met.   OT Evaluation Precautions/Restrictions  Precautions Precautions: Fall Restrictions LLE Weight Bearing: Non weight bearing   Pain Pain Assessment Pain Score: 6  Home Living/Prior Functioning Home Living Family/patient expects to be discharged to:: Private residence Living Arrangements: Children, Other relatives, Other (Comment) (Daughter and Games developer ) Available Help at Discharge: Family, Available 24 hours/day Type of Home: House Home Access: Stairs to enter Technical brewer of Steps: 5 Entrance Stairs-Rails: None Home Layout: One level Bathroom Shower/Tub: Chiropodist: Standard  Lives With: Daughter Prior Function Level of Independence: Independent with basic ADLs, Independent with homemaking with ambulation, Requires assistive device for independence Driving: No Vocation: Retired Comments: uses quad cane for ambulation, attends Silver sneakers, independent with bathing and grooming  ADL ADL ADL Comments: Refer to functional navigator Vision Baseline Vision/History: Wears glasses Wears Glasses: At all times Patient Visual Report: Blurring of vision Vision Assessment?: No apparent visual deficits Additional Comments: continues to have "mild" blurry vision per pt Perception  Perception: Within Functional Limits Praxis Praxis: Intact Cognition Overall Cognitive Status: Within Functional Limits for tasks assessed Arousal/Alertness: Awake/alert Orientation Level: Person;Place;Situation Person: Oriented Place: Oriented Situation: Oriented Year: 2018 Month: October Day of Week: Correct Memory: Appears intact Immediate Memory Recall: Sock;Bed;Blue Memory Recall: Sock;Blue;Bed Memory Recall Sock: Without Cue Memory Recall Blue: Without  Cue Memory Recall Bed: Without Cue Sensation Sensation Light Touch: Appears Intact (BUE) Stereognosis: Appears Intact Hot/Cold: Appears Intact Proprioception: Appears Intact (RLE/RUE only) Additional Comments: Pt has LUE hemiplegia with no active movement, L AKA amputation Coordination Gross Motor Movements are Fluid and Coordinated: No Fine Motor Movements are Fluid and Coordinated: No Coordination and Movement Description: due to L hemiplegia Motor  Motor Motor: Hemiplegia Motor - Skilled Clinical Observations: generalized motor weakness Mobility    mod -max A bed mobility and stand pivot Trunk/Postural Assessment  Cervical Assessment Cervical Assessment: Within Functional Limits Thoracic Assessment Thoracic Assessment: Within Functional Limits Lumbar Assessment Lumbar Assessment: Within Functional Limits Postural Control Postural Control:  (sitting WFL, standing on RLE only - max A)  Balance Dynamic Sitting Balance Dynamic Sitting - Level of Assistance: 5: Stand by assistance Static Standing Balance Static Standing - Level of Assistance: 2: Max assist Extremity/Trunk Assessment RUE Assessment RUE Assessment: Within Functional Limits LUE Assessment LUE Assessment: Exceptions to WFL LUE Tone LUE Tone Comments: CVA 15 years ago with no active movement and increased flexor tone.  No PROM restricitons in hand as pt has been diligent with self ROM.  restricted sh flexion above 85 degrees.   See Function Navigator for Current Functional Status.   Refer to Care Plan for Long Term Goals  Recommendations for other services: None    Discharge Criteria: Patient will be discharged from OT if patient refuses treatment 3 consecutive times without medical reason, if treatment goals not met, if there is a change in medical status, if patient makes no progress towards goals or if patient is discharged from hospital.  The above assessment, treatment plan, treatment alternatives and  goals were discussed and mutually agreed upon: by patient  [facility] 07/13/2017,  1:15 PM

## 2017-07-13 NOTE — Progress Notes (Signed)
[place] PHYSICAL MEDICINE & REHABILITATION     PROGRESS NOTE    Subjective/Complaints: Had a good night. Pain controlled. Had a good night  ROS: pt denies nausea, vomiting, diarrhea, cough, shortness of breath or chest pain   Objective: Vital Signs: Blood pressure 131/65, pulse 82, temperature 98.6 F (37 C), temperature source Oral, resp. rate 18, height 5' 2.01" (1.575 m), weight 63 kg (138 lb 14.2 oz), SpO2 97 %. No results found.  Recent Labs  07/12/17 0303 07/13/17 0607  WBC 8.2 9.0  HGB 8.6* 9.0*  HCT 27.5* 28.8*  PLT 370 330    Recent Labs  07/11/17 0341 07/13/17 0607  NA 136 137  K 3.5 3.3*  CL 103 104  GLUCOSE 101* 87  BUN <5* 7  CREATININE 0.66 0.65  CALCIUM 8.2* 8.3*   CBG (last 3)  No results for input(s): GLUCAP in the last 72 hours.  Wt Readings from Last 3 Encounters:  07/13/17 63 kg (138 lb 14.2 oz)  07/11/17 64 kg (141 lb)    Physical Exam:  Constitutional: She appears well-developed.  Obese  HENT:  Head: Normocephalic and atraumatic.  Eyes: EOM are normal. Right eye exhibits no discharge. Left eye exhibits no discharge.  Neck: Normal range of motion. Neck supple.  Cardiovascular: RRR   Respiratory: CTA B GI: Soft. Bowel sounds are normal.  Musculoskeletal: She exhibits edema and tenderness.  Left AKA tender to touch.   Neurological: She is alert.  Left facial weakness with minimal dysarthria.  LUE with edema  Able to follow basic commands without difficulty.  Motor: RUE: 5/5 proximal to distal LUE: 0/5 proximal to distal with tone and contracture in wrist and finger flexors RLE: HF, KE 4/5, ADF/PF 4/5 (pain inhibition) LLE: HF 4/5 (pain inhibition)  Skin:  +VAC abdomen. AK incision cdi. RLE dry without breakdown, incision healed Psychiatric: Her affect is blunt and pleasant.  Assessment/Plan: 1. Functional deficits secondary to left AKA which require 3+ hours per day of interdisciplinary therapy in a comprehensive inpatient  rehab setting. Physiatrist is providing close team supervision and 24 hour management of active medical problems listed below. Physiatrist and rehab team continue to assess barriers to discharge/monitor patient progress toward functional and medical goals.  Function:  Bathing Bathing position      Bathing parts      Bathing assist        Upper Body Dressing/Undressing Upper body dressing                    Upper body assist        Lower Body Dressing/Undressing Lower body dressing                                  Lower body assist        Toileting Toileting          Toileting assist     Transfers Chair/bed Engineer, materials activity did not occur: N/A Type: Manual      Cognition Comprehension Comprehension assist level: Follows complex conversation/direction with no assist  Expression    Social Interaction Social Interaction assist level: Interacts appropriately with others - No medications needed.  Problem Solving Problem solving assist level: Solves complex problems: Recognizes & self-corrects  Memory Memory  assist level: Complete Independence: No helper   Medical Problem List and Plan: 1.  Decreased functional mobility secondary to left AKA10/06/2017 as well as history of CVA with left-sided residual weakness.  -beginning therapies today 2.  DVT Prophylaxis/Anticoagulation: Intravenous heparin transitioned to Xarelto 07/12/2017 3. Pain Management: Neurontin 300 mg twice a day,Oxycodone as needed  -pre-treat with oxycodone prior to therapies 4. Mood:  Provide emotional support 5. Neuropsych: This patient is capable of making decisions on her own behalf. 6. Skin/Wound Care:  Routine skin checks 7. Fluids/Electrolytes/Nutrition:  Routine eye and nose with follow-up chemistries 8.Abdominal abscesses. Status post laparotomy, lysis of adhesions repair perforated bowel  06/19/2017 at [facility] post percutaneous drain placement October 1 and removed 07/11/2017.Continue Vantin 200 mg every 12 hours, Flagyl 500 mg every 8 hours.ID TO F/U ON DURATION OF ANTIBIOTICS 9.Fungemia with endophthalmitis.follow-up ophthalmology services.Intravenous fluconazole 800 mg daily with plan to change to oral therapy per infectious disease with plan for 6 weeks of therapy. Status post Pars Plana Vitrectomy, membrane peeling 07/11/2017  10.Acute blood loss anemia. Follow-up CBC. Continue iron supplement 11.Hypertension. Norvasc 5 mg daily 12.tobacco abuse. Counseling 13. Constipation. Laxative assistance   LOS (Days) 1 A FACE TO FACE EVALUATION WAS PERFORMED  Alger Simons T, MD 07/13/2017 8:50 AM

## 2017-07-13 NOTE — Progress Notes (Signed)
Patient information reviewed and entered into eRehab system by Kaiesha Tonner, RN, CRRN, PPS Coordinator.  Information including medical coding and functional independence measure will be reviewed and updated through discharge.     Per nursing patient was given "Data Collection Information Summary for Patients in Inpatient Rehabilitation Facilities with attached "Privacy Act Statement-Health Care Records" upon admission.  Present in Education notebook.  

## 2017-07-13 NOTE — Evaluation (Signed)
Physical Therapy Assessment and Plan  Patient Details  Name: Anna Hayes MRN: [id number] Date of Birth: [birth year]  PT Diagnosis: Contracture of joint: R ankle, Difficulty walking, Dizziness and giddiness, Edema, Hemiparesis non-dominant and Impaired sensation Rehab Potential: Good ELOS: 12-14   Today's Date: 07/13/2017 PT Individual Time: 1415-1530 PT Individual Time Calculation (min): 75 min    Problem List:  Patient Active Problem List   Diagnosis Date Noted  . Amputation of left lower extremity above knee upon examination ([place]) 07/12/2017  . Unilateral AKA, left ([place])   . Neuropathic pain   . Fungemia   . Benign essential HTN   . Slow transit constipation   . Intra-abdominal abscess ([place])   . Fungal endophthalmitis   . Pressure injury of skin 07/06/2017  . Pelvic abscess in female [facility])   . Pelvic abscess in female   . DNR (do not resuscitate) discussion   . Palliative care by specialist   . Ischemic neuropathy of left foot   . Abdominal pain   . History of CVA with residual deficit   . Tobacco abuse   . Acute blood loss anemia   . Post-operative pain   . Leukocytosis   . PAD (peripheral artery disease) ([place]) 06/27/2017  . Critical lower limb ischemia 06/27/2017  . SBO (small bowel obstruction) ([place]) 06/27/2017  . Intraabdominal fluid collection absess 06/27/2017  . Candidemia ([place]) 06/27/2017  . Stroke [facility])     Past Medical History:  Past Medical History:  Diagnosis Date  . Frequent headaches   . Hypertension   . Muscle pain   . Palpitations   . Sinus problem   . Stroke [facility])    Past Surgical History:  Past Surgical History:  Procedure Laterality Date  . AMPUTATION Left 07/05/2017   Procedure: LEFT ABOVE KNEE AMPUTATION;  Surgeon: Conrad [place], MD;  Location: [place];  Service: Vascular;  Laterality: Left;  . EMBOLECTOMY Bilateral 06/27/2017   Procedure: BILATERAL FEMORAL POPLITEAL EMBOLECTOMY;  Surgeon: Serafina Mitchell, MD;  Location: [facility] OR;   Service: Vascular;  Laterality: Bilateral;  . FASCIOTOMY Left 06/27/2017   Procedure: FULL COMPARTMENT LEFT LOWER LEG FASCIOTOMY;  Surgeon: Serafina Mitchell, MD;  Location: [place];  Service: Vascular;  Laterality: Left;  . PATCH ANGIOPLASTY Left 06/27/2017   Procedure: PATCH ANGIOPLASTY Left Posterior Tibial Artery;  Surgeon: Serafina Mitchell, MD;  Location: [place];  Service: Vascular;  Laterality: Left;  . 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 07/11/2017   Procedure: REPAIR OF COMPLEX TRACTION RETINAL DETACHMENT WITH ENDO LASER AND ANTIFUNGAL INJECTION;  Surgeon: Jalene Mullet, MD;  Location: [place];  Service: Ophthalmology;  Laterality: Right;  . TEE WITHOUT CARDIOVERSION N/A 07/11/2017   Procedure: TRANSESOPHAGEAL ECHOCARDIOGRAM (TEE);  Surgeon: Acie Fredrickson Wonda Cheng, MD;  Location: [facility] ENDOSCOPY;  Service: Cardiovascular;  Laterality: N/A;    Assessment & Plan Clinical Impression: [facility] a 66 y.o.femalewith history of palpitations, PAD, tobacco use, CVA with residual Left HP. Per chart review patient lives with children. Independent with assistive device prior to admission. One level home with 5 steps to entry. Family assistance is needed. Patient was who was admitted to [facility] with SBO who underwent lysis of adhesions and repair of perforated bowel 06/19/17. History taken from chart review. Hospital course complicated by fevers with leucocytosis due to s/p drain placement 10/1, fungemia and development of ischemic LLE due to occlusion of left popliteal and femoral arteries as well as thrombus in right femoral artery.  She was started on IV heparin and transferred to [facility] on 10/2 due as patient with significant pain with motor and sensory deficits. She was taken to OR for BLE thrombectomy with left 4 compartment fasciotomy by Dr. Donavan Burnet ischemic changes of left lower extremity and underwent left AKA 07/05/2017 per Dr. Bridgett Larsson.Hospital course pain management  as well as acute blood loss anemia 8.2 and monitored. Ophthalmology consulted to 07/07/2017 for suspect fungemis with endophthalamis OD>OS and placed on antifungal agents as well as follow-up per infectious disease. She also remains on Vantin as well as Flagyl to cover bacterial pathogens of abdominal abscess and plan to continue for 1 month and then drop the fluconazole 200 mg daily 2 week course. Underwent [place] Vitrectomy, membrane peeling 07/11/2017 per ophthalmology.  Patient transferred to CIR on 07/12/2017 .   Patient currently requires max with mobility secondary to muscle weakness and muscle joint tightness, decreased cardiorespiratoy endurance, impaired timing and sequencing, abnormal tone and unbalanced muscle activation and decreased sitting balance, decreased standing balance, decreased postural control, hemiplegia and decreased balance strategies.  Prior to hospitalization, patient was modified independent  with mobility and lived with Daughter in a House home.  Home access is 5Stairs to enter.  Patient will benefit from skilled PT intervention to maximize safe functional mobility, minimize fall risk and decrease caregiver burden for planned discharge home with 24 hour supervision.  Anticipate patient will benefit from follow up [place] at discharge.  PT - End of Session Activity Tolerance: Tolerates 10 - 20 min activity with multiple rests Endurance Deficit: Yes Endurance Deficit Description: pt exhausted and falling asleep during eval PT Assessment Rehab Potential (ACUTE/IP ONLY): Good PT Barriers to Discharge: Inaccessible home environment PT Barriers to Discharge Comments: currently has 5 Steps to descend from sidewalk to enter apartment; no railing PT Patient demonstrates impairments in the following area(s): Balance;Edema;Endurance;Motor;Sensory;Pain PT Transfers Functional Problem(s): Bed Mobility;Bed to Chair;Car;Furniture PT Locomotion Functional Problem(s): Wheelchair  Mobility PT Plan PT Intensity: Minimum of 1-2 x/day ,45 to 90 minutes PT Frequency: 5 out of 7 days PT Duration Estimated Length of Stay: 12-14 PT Treatment/Interventions: Balance/vestibular training;Discharge planning;Community reintegration;DME/adaptive equipment instruction;Functional mobility training;Patient/family education;Pain management;Neuromuscular re-education;Psychosocial support;Splinting/orthotics;Therapeutic Exercise;Therapeutic Activities;UE/LE Strength taining/ROM;UE/LE Coordination activities;Wheelchair propulsion/positioning PT Transfers Anticipated Outcome(s): supervision basic; min assist car PT Locomotion Anticipated Outcome(s): supervision w/c x 150' controlled env, 50' home env, 150' community environment PT Recommendation Follow Up Recommendations: Home health PT Patient destination: Home Equipment Recommended: Wheelchair (measurements);Wheelchair cushion (measurements) Equipment Details: pt owns [facility]  Skilled Therapeutic Intervention:  Pt asleep in w/c when PT entered room.  Pt stated she "hurt all over" and was exhausted.  Pt repositioned self in w/c with mod assist.  W/c propulsion on level tile with 2 turns. Pt needs ultra hemi wc in order to steer with R foot. None available at present. Sit> stand facing raised bed, using R hand to pull up, x 10 seconds, x 20 seconds.  Pt c/o dizziness upon standing but less so during 2nd trial. Stand pivot to return to bed.   Bed mobility training for rolling R, using hemi method.  Unilateral bridging RLE x 5.  Discussed scheduling with pt and dtr [name].  [name] stated that ramp is pending to allow access down to their apt.   Placed 18 x 18 gel cushion in w/c, and instructed Eva in massaging gel QD.   Pt left resting in bed in L side lying with pillows for positioning, and all needs within reach; passed pt to  Katie, OT entering room.   PT Evaluation Precautions/Restrictions Precautions Precautions: Fall Precaution Comments: LUE  hemi plegic Restrictions Weight Bearing Restrictions: Yes LLE Weight Bearing: Non weight bearing General   Vital SignsTherapy Vitals Temp: 98.5 F (36.9 C) Temp Source: Oral Pulse Rate: (!) 107 Resp: 18 BP: 124/69 Patient Position (if appropriate): Lying Oxygen Therapy SpO2: 100 % O2 Device: Not Delivered Pain Pain Assessment Pain Assessment: No/denies pain Pain Score: 7  Pain Location: Leg (" all over") Pain Orientation: Left Pain Intervention(s): Medication (See eMAR) Home Living/Prior Functioning Home Living Available Help at Discharge: Family;Available 24 hours/day Type of Home: House Home Access: Stairs to enter [employer] of Steps: 5 Entrance Stairs-Rails: None (plans to install railings and /or ramp) Home Layout: One level Bathroom Shower/Tub: Chiropodist: Standard  Lives With: Daughter Prior Function Level of Independence: Independent with basic ADLs;Independent with homemaking with ambulation;Requires assistive device for independence Driving: No 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: uses quad cane for ambulation, attends [employer], independent with bathing and grooming  Vision/Perception  Vision - Assessment Additional Comments: continues to have "mild" blurry vision per pt Perception Perception: Within Functional Limits Praxis Praxis: Intact  Cognition Overall Cognitive Status: Within Functional Limits for tasks assessed Arousal/Alertness: Awake/alert Orientation Level: Oriented X4 Memory: Appears intact Sensation Sensation Light Touch: Impaired Detail Light Touch Impaired Details: Impaired RLE (great toe and 2nd toe) Stereognosis: Appears Intact Hot/Cold: Appears Intact Proprioception: Appears Intact Additional Comments: Pt has LUE hemiplegia with no active movement, L AKA amputation Coordination Gross Motor Movements are Fluid and Coordinated: No Fine Motor Movements are Fluid  and Coordinated: No Coordination and Movement Description: due to L hemiplegia Motor  Motor Motor: Hemiplegia Motor - Skilled Clinical Observations: generalized motor weakness  Mobility Bed Mobility Bed Mobility: Rolling Right;Rolling Left;Sit to Sidelying Right;Sit to Supine Rolling Right: 5: Supervision Rolling Right Details: Verbal cues for technique Rolling Left: 3: Mod assist Rolling Left Details: Manual facilitation for weight shifting;Verbal cues for technique;Manual facilitation for placement Sit to Supine: 2: Max assist Sit to Supine - Details: Manual facilitation for weight shifting;Verbal cues for technique;Manual facilitation for placement Transfers Transfers: Yes Stand Pivot Transfers: 2: Max assist Stand Pivot Transfer Details: Manual facilitation for weight shifting;Verbal cues for technique;Manual facilitation for weight bearing Stand Pivot Transfer Details (indicate cue type and reason): using bed rail with R hand, moving to R Locomotion  Ambulation Ambulation: No Gait Gait: No Stairs / Additional Locomotion Stairs: No Wheelchair Mobility Wheelchair Mobility: Yes Wheelchair Assistance: 3: Mod assist Wheelchair Propulsion: Right upper extremity (pt is 5'2" tall; R foot did not reach floor; assistance for steering) Wheelchair Parts Management: Needs assistance Distance: 50  Trunk/Postural Assessment  Cervical Assessment Cervical Assessment: Within Functional Limits Thoracic Assessment Thoracic Assessment: Within Functional Limits Lumbar Assessment Lumbar Assessment: Within Functional Limits Postural Control Postural Control: Deficits on evaluation Righting Reactions: deficient due to R SLS  Protective Responses: delayed and inadquate  Balance Balance Balance Assessed: Yes Static Sitting Balance Static Sitting - Level of Assistance: 5: Stand by assistance Dynamic Sitting Balance Dynamic Sitting - Level of Assistance: 4: Min assist Sitting balance -  Comments: min assist or R UE support Static Standing Balance Static Standing - Level of Assistance: 2: Max assist Extremity Assessment  RUE Assessment RUE Assessment: Within Functional Limits LUE Assessment LUE Assessment: Exceptions to WFL LUE Tone LUE Tone Comments: CVA 15 years ago with no active movement and increased flexor tone.  No PROM restricitons in  hand as pt has been diligent with self ROM.  restricted sh flexion above 85 degrees. RLE Assessment RLE Assessment: Exceptions to [facility] RLE PROM (degrees) RLE Overall PROM Comments: ankle DF -10 degrees due to tightness heel cord RLE Strength RLE Overall Strength Comments: grossly in sitting: hip flex, knee ext, ankle DF 4/5 LLE Assessment LLE Assessment: Exceptions to [facility] LLE Strength LLE Overall Strength Comments: grossly in sitting: hip flex 2-/5; hip abd/add 2/5  See Function Navigator for Current Functional Status.  Refer to Care Plan for Long Term Goals  Recommendations for other services: Therapeutic Recreation  Stress management and Outing/community reintegration  Discharge Criteria: Patient will be discharged from PT if patient refuses treatment 3 consecutive times without medical reason, if treatment goals not met, if there is a change in medical status, if patient makes no progress towards goals or if patient is discharged from hospital.  The above assessment, treatment plan, treatment alternatives and goals were discussed and mutually agreed upon: by patient and by family  Ninel Abdella 07/13/2017, 4:11 PM

## 2017-07-14 ENCOUNTER — Inpatient Hospital Stay (HOSPITAL_COMMUNITY): Payer: Medicare Other | Admitting: Occupational Therapy

## 2017-07-14 ENCOUNTER — Inpatient Hospital Stay (HOSPITAL_COMMUNITY): Payer: Medicare Other | Admitting: Physical Therapy

## 2017-07-14 DIAGNOSIS — D62 Acute posthemorrhagic anemia: Secondary | ICD-10-CM

## 2017-07-14 MED ORDER — BISACODYL 10 MG RE SUPP: 10.0000 mg | Freq: Every day | RECTAL | Status: DC | PRN

## 2017-07-14 MED ORDER — POTASSIUM CHLORIDE CRYS ER 20 MEQ PO TBCR
20.0000 meq | EXTENDED_RELEASE_TABLET | Freq: Every day | ORAL | Status: DC
  Administered 2017-07-14 – 2017-07-25 (×12): 20 meq via ORAL
  Filled 2017-07-14 (×13): qty 1

## 2017-07-14 MED ORDER — SENNOSIDES-DOCUSATE SODIUM 8.6-50 MG PO TABS
1.0000 | ORAL_TABLET | Freq: Two times a day (BID) | ORAL | Status: DC
  Administered 2017-07-14 – 2017-07-28 (×28): 1 via ORAL
  Filled 2017-07-14 (×28): qty 1

## 2017-07-14 NOTE — Plan of Care (Signed)
Problem: RH BOWEL ELIMINATION Goal: RH STG MANAGE BOWEL WITH ASSISTANCE STG Manage Bowel with mod  Assistance.   Outcome: Not Progressing No bowel movement  Goal: RH STG MANAGE BOWEL W/MEDICATION W/ASSISTANCE STG Manage Bowel with Medication with mod  Assistance.   Outcome: Not Progressing No bowel movement Goal: RH STG MANAGE BOWEL W/EQUIPMENT W/ASSISTANCE STG Manage Bowel With Equipment With mod Assistance   Outcome: Not Progressing No bowel movement Goal: RH OTHER STG BOWEL ELIMINATION GOALS W/ASSIST Other STG Bowel Elimination Goals With  Mod Assistance.   Outcome: Not Progressing No bowel movement  Problem: RH SKIN INTEGRITY Goal: RH STG SKIN FREE OF INFECTION/BREAKDOWN With mod assistance  Outcome: Progressing Pt has no s/s of skin infection Goal: RH STG MAINTAIN SKIN INTEGRITY WITH ASSISTANCE STG Maintain Skin Integrity With  Mod Assistance.   Outcome: Not Progressing Total assist with wounds  Problem: RH SAFETY Goal: RH STG ADHERE TO SAFETY PRECAUTIONS W/ASSISTANCE/DEVICE STG Adhere to Safety Precautions With mod assist Assistance/Device.   Outcome: Progressing Pt calls appropriately, safe in all situations.  Problem: RH PAIN MANAGEMENT Goal: RH STG PAIN MANAGED AT OR BELOW PT'S PAIN GOAL With mod assist  Outcome: Progressing Pain controlled with current pain meds prn

## 2017-07-14 NOTE — Progress Notes (Signed)
Orthopedic Tech Progress Note Patient Details:  Anna Hayes 1951/06/23 [id number] Called [employer] for brace order. Patient ID: Anna Hayes, female   DOB: Jul 15, 1951, 66 y.o.   MRN: [id number]   Anna Hayes 07/14/2017, 3:45 PM

## 2017-07-14 NOTE — Progress Notes (Signed)
Occupational Therapy Session Note  Patient Details  Name: Anna Hayes MRN: [id number] Date of Birth: [birth year]  Today's Date: 07/14/2017 OT Individual Time: 1421-1506 OT Individual Time Calculation (min): 45 min    Short Term Goals: Week 1:  OT Short Term Goal 1 (Week 1): Pt will complete a transfer to toilet with mod A. OT Short Term Goal 2 (Week 1): Pt will don pants from bed level with mod A. OT Short Term Goal 3 (Week 1): pt will be able to lateral lean to doff pants while sitting on toilet with mod A.  Skilled Therapeutic Interventions/Progress Updates:    Tx focus on sitting balance, memory, and trunk control during leisure participation.   Pt greeted supine in bed, required encouragement to participate. Agreeable to tx in room at EOB. Mod A for elevating trunk during supine<sit. Played card game with new learning demands, working on lateral leans while retrieving card hands placed under each hip. Difficultly with weight shifting to Lt side, but pt able to complete to meet task demands. Max cues for attention and recalling discussed rules of game. Pt with tangential tendencies and required cues to redirect. At end of tx pt returned to supine, boosted herself up in bed with use of headboard, and was left with all needs within reach.   Therapy Documentation Precautions:  Precautions Precautions: Fall Precaution Comments: LUE hemi plegic Restrictions Weight Bearing Restrictions: Yes LLE Weight Bearing: Non weight bearing General: General OT Amount of Missed Time: 11 Minutes Pain: Pain Assessment Pain Assessment: No/denies pain Pain Score: 0-No pain Faces Pain Scale: No hurt Pain Location: Other (Comment) (Residual limb) Pain Orientation: Left Pain Onset: On-going Pain Intervention(s): Repositioned ADL: ADL ADL Comments: Refer to functional navigator     See Function Navigator for Current Functional Status.   Therapy/Group: Individual Therapy  Harbour Nordmeyer A  Jerrell Mangel 07/14/2017, 3:58 PM

## 2017-07-14 NOTE — IPOC Note (Signed)
Overall Plan of Care [facility]) Patient Details Name: Anna Hayes MRN: [id number] DOB: 04-Mar-1951  Admitting Diagnosis: <principal problem not specified>Left AKA  Hospital Problems: Active Problems:   Amputation of left lower extremity above knee upon examination [facility])     Functional Problem List: Nursing    PT Balance, Edema, Endurance, Motor, Sensory, Pain  OT Balance, Pain, Motor, Endurance, Skin Integrity, Safety  SLP    TR         Basic ADL's: OT Bathing, Dressing, Toileting     Advanced  ADL's: OT       Transfers: PT Bed Mobility, Bed to Chair, Car, Manufacturing systems engineer, Metallurgist: PT Wheelchair Mobility     Additional Impairments: OT None  SLP        TR      Anticipated Outcomes Item Anticipated Outcome  Self Feeding mod I  Swallowing      Basic self-care  set up bathing and dressing bed level  Toileting  mod A   Bathroom Transfers supervision to toilet, min A to tub bench (when wound vac is removed)  Bowel/Bladder     Transfers  supervision basic; min assist car  Locomotion  supervision w/c x 150' controlled env, 50' home env, 150' community environment  Communication     Cognition     Pain     Safety/Judgment      Therapy Plan: PT Intensity: Minimum of 1-2 x/day ,45 to 90 minutes PT Frequency: 5 out of 7 days PT Duration Estimated Length of Stay: 12-14 OT Intensity: Minimum of 1-2 x/day, 45 to 90 minutes OT Frequency: 5 out of 7 days OT Duration/Estimated Length of Stay: 12-14 days      Team Interventions: Nursing Interventions    PT interventions Balance/vestibular training, Discharge planning, Community reintegration, DME/adaptive equipment instruction, Functional mobility training, Patient/family education, Pain management, Neuromuscular re-education, Psychosocial support, Splinting/orthotics, Therapeutic Exercise, Therapeutic Activities, UE/LE Strength taining/ROM, UE/LE Coordination activities, Wheelchair  propulsion/positioning  OT Interventions Training and development officer, Discharge planning, DME/adaptive equipment instruction, Functional mobility training, Pain management, Patient/family education, Psychosocial support, Self Care/advanced ADL retraining, Skin care/wound managment, Therapeutic Activities, Therapeutic Exercise, UE/LE Strength taining/ROM, UE/LE Coordination activities  SLP Interventions    TR Interventions    SW/CM Interventions Discharge Planning, Psychosocial Support, Patient/Family Education   Barriers to Discharge MD  Medical stability  Nursing      PT Inaccessible home environment currently has 5 Steps to descend from sidewalk to enter apartment; no railing  OT Inaccessible home environment, Wound Care, Incontinence stairs to enter  SLP      SW       Team Discharge Planning: Destination: PT-Home ,OT- Home , SLP-  Projected Follow-up: PT-Home health PT, OT-  Home health OT, SLP-  Projected Equipment Needs: PT-Wheelchair (measurements), Wheelchair cushion (measurements), OT- To be determined, SLP-  Equipment Details: PT-pt owns [facility], OT-  Patient/family involved in discharge planning: PT- Patient, Family member/caregiver,  OT-Patient, SLP-   MD ELOS: 12-14 days Medical Rehab Prognosis:  Excellent Assessment: The patient has been admitted for CIR therapies with the diagnosis of left AKA. The team will be addressing functional mobility, strength, stamina, balance, safety, adaptive techniques and equipment, self-care, bowel and bladder mgt, patient and caregiver education, pain mgt, wound care, pre-prosthetic education. Goals have been set at supervision to min assist+ with self-care and ADL's and supervision with transfers and w/c level mobility.    Meredith Staggers, MD, Mellody Drown  See Team Conference Notes for weekly updates to the plan of care

## 2017-07-14 NOTE — Consult Note (Signed)
[facility] Nurse wound consult note  Reason for Consult: Vac dressing change to abd wound Wound type: Full thickness post-op wound with sutures visible to inner wound bed Wound bed: Appearance unchanged since previous assessment.  90% red and moist, 10% yellow slough to wound edges, decreasing amt of undermining to wound edges.   Drainage (amount, consistency, odor) modamt tandrainage in the cannister, no odor Periwound: Intact skin surrounding Dressing procedure/placement/frequency: Applied Mepitel contact layer, then one half piece white foam, then one piece black foam. Pt medicated for pain prior to the procedure and tolerated with mod amt discomfort. WOC will plan to change abd Vac dressing Q M/W/F at 0800. Discussed plan of care and pt verbalized understanding. Julien Girt MSN, RN, [place], [place], [place]

## 2017-07-14 NOTE — Progress Notes (Signed)
[facility] PHYSICAL MEDICINE & REHABILITATION     PROGRESS NOTE    Subjective/Complaints: Was fatigued yesterday. Struggled with activity tolerance in therapy. Feels better today  ROS: pt denies nausea, vomiting, diarrhea, cough, shortness of breath or chest pain   Objective: Vital Signs: Blood pressure 120/62, pulse 92, temperature 98.7 F (37.1 C), temperature source Oral, resp. rate 18, height 5' 2.01" (1.575 m), weight 63 kg (138 lb 14.2 oz), SpO2 98 %. No results found.  Recent Labs  07/12/17 0303 07/13/17 0607  WBC 8.2 9.0  HGB 8.6* 9.0*  HCT 27.5* 28.8*  PLT 370 330    Recent Labs  07/13/17 0607  NA 137  K 3.3*  CL 104  GLUCOSE 87  BUN 7  CREATININE 0.65  CALCIUM 8.3*   CBG (last 3)  No results for input(s): GLUCAP in the last 72 hours.  Wt Readings from Last 3 Encounters:  07/13/17 63 kg (138 lb 14.2 oz)  07/11/17 64 kg (141 lb)    Physical Exam:  Constitutional: She appears well-developed.  Obese  HENT:  Head: Normocephalic and atraumatic.  Eyes: EOM are normal. Right eye exhibits no discharge. Left eye exhibits no discharge.  Neck: Normal range of motion. Neck supple.  Cardiovascular: RRR without murmur. No JVD    Respiratory: CTA Bilaterally without wheezes or rales. Normal effort  GI:  BS +, non-tender, non-distended .  Musculoskeletal: She exhibits edema and tenderness.  Left AKA tender to touch.   Neurological: She is alert.  Left facial weakness with minimal dysarthria.  LUE with edema  Able to follow basic commands without difficulty.  Motor: RUE: 5/5 proximal to distal LUE: 0/5 proximal to distal with tone and contracture in wrist and finger flexors RLE: HF, KE 4/5, ADF/PF 4/5 (pain inhibition) LLE: HF 4/5 (pain inhibition)  Skin:  +VAC abdomen remains in place. AK incision cdi. RLE dry without breakdown, incision healed Psychiatric: Her affect is pleasant.  Assessment/Plan: 1. Functional deficits secondary to left AKA which  require 3+ hours per day of interdisciplinary therapy in a comprehensive inpatient rehab setting. Physiatrist is providing close team supervision and 24 hour management of active medical problems listed below. Physiatrist and rehab team continue to assess barriers to discharge/monitor patient progress toward functional and medical goals.  Function:  Bathing Bathing position   Position: Wheelchair/chair at sink  Bathing parts Body parts bathed by patient: Chest, Left arm, Abdomen, Front perineal area, Right upper leg, Left upper leg Body parts bathed by helper: Right arm, Right lower leg, Back  Bathing assist        Upper Body Dressing/Undressing Upper body dressing   What is the patient wearing?: Pull over shirt/dress     Pull over shirt/dress - Perfomed by patient: Thread/unthread right sleeve, Put head through opening, Pull shirt over trunk Pull over shirt/dress - Perfomed by helper: Thread/unthread left sleeve        Upper body assist Assist Level: 2 helpers      Lower Body Dressing/Undressing Lower body dressing Lower body dressing/undressing activity did not occur: N/A What is the patient wearing?: Pants, Non-skid slipper socks       Pants- Performed by helper: Thread/unthread right pants leg, Thread/unthread left pants leg, Pull pants up/down   Non-skid slipper socks- Performed by helper: Don/doff right sock                  Lower body assist Assist for lower body dressing: 2 Helpers (+2 with sit to stand  from w/c)      Toileting Toileting Toileting activity did not occur: No continent bowel/bladder event        Toileting assist     Transfers Chair/bed transfer Chair/bed transfer activity did not occur: Safety/medical concerns Chair/bed transfer method: Stand pivot Chair/bed transfer assist level: Maximal assist (Pt 25 - 49%/lift and lower) Chair/bed transfer assistive device: Bedrails     Locomotion Ambulation Ambulation activity did not occur:  Safety/medical concerns (dizzy with standing; LUE hemi)         Wheelchair Wheelchair activity did not occur: N/A Type: Manual Max wheelchair distance: 50 Assist Level: Moderate assistance (Pt 50 - 74%)  Cognition Comprehension Comprehension assist level: Follows complex conversation/direction with no assist  Expression Expression assist level: Expresses complex ideas: With no assist  Social Interaction Social Interaction assist level: Interacts appropriately with others - No medications needed.  Problem Solving Problem solving assist level: Solves complex problems: Recognizes & self-corrects  Memory Memory assist level: Complete Independence: No helper   Medical Problem List and Plan: 1.  Decreased functional mobility secondary to left AKA10/06/2017 as well as history of CVA with left-sided residual weakness.  -continue therapies. Hope for better activity tolerance today.   -stump sock/belt ordered  2.  DVT Prophylaxis/Anticoagulation: Intravenous heparin transitioned to Xarelto 07/12/2017 3. Pain Management: Neurontin 300 mg twice a day,Oxycodone as needed  -pre-treat with oxycodone prior to therapies 4. Mood:  Provide emotional support 5. Neuropsych: This patient is capable of making decisions on her own behalf. 6. Skin/Wound Care:  Routine skin checks 7. Fluids/Electrolytes/Nutrition:  Routine eye and nose with follow-up chemistries 8.Abdominal abscesses. Status post laparotomy, lysis of adhesions repair perforated bowel 06/19/2017 at [facility] post percutaneous drain placement October 1 and removed 07/11/2017.Continue Vantin 200 mg every 12 hours, Flagyl 500 mg every 8 hours. ID TO F/U ON DURATION OF ANTIBIOTICS 9.Fungemia with endophthalmitis.follow-up ophthalmology services.Intravenous fluconazole 800 mg daily with plan to change to oral therapy per infectious disease with plan for 6 weeks of therapy. Status post [place] Vitrectomy, membrane peeling 07/11/2017   10.Acute blood loss anemia. Follow-up hgb 9.0  . Continue iron supplement  -recheck next week 11.Hypertension. Norvasc 5 mg daily 12.tobacco abuse. Counseling 13. Constipation. Laxative assistance  -needs bm today. Last one 10/16   LOS (Days) 2 A FACE TO FACE EVALUATION WAS PERFORMED  Meredith Staggers, MD 07/14/2017 8:23 AM

## 2017-07-14 NOTE — Plan of Care (Signed)
Problem: RH BOWEL ELIMINATION Goal: RH STG MANAGE BOWEL WITH ASSISTANCE STG Manage Bowel with mod  Assistance.   Outcome: Progressing  07/14/17 1217  Bowel Management Goals  STG: Pt will manage bowels with assistance 2-Maximum assistance   Goal: RH STG MANAGE BOWEL W/MEDICATION W/ASSISTANCE STG Manage Bowel with Medication with mod  Assistance.   Outcome: Progressing  07/14/17 1217  Bowel Management Goals  STG: Pt will manage bowels with medication with assistance 1-Total assistance   Goal: RH STG MANAGE BOWEL W/EQUIPMENT W/ASSISTANCE STG Manage Bowel With Equipment With mod Assistance   Outcome: Progressing  07/14/17 1217  Bowel Management Goals  STG: Pt will manage bowels with equipment with assistance 1-Total assistance    Problem: RH SKIN INTEGRITY Goal: RH STG MAINTAIN SKIN INTEGRITY WITH ASSISTANCE STG Maintain Skin Integrity With  Mod Assistance.   Outcome: Progressing  07/14/17 1217  Skin Integrity Goals  STG: Maintain skin integrity with assistance 1-Total assistance   Goal: RH STG ABLE TO PERFORM INCISION/WOUND CARE W/ASSISTANCE STG Able To Perform Incision/Wound Care With min Assistance.   Outcome: Progressing  07/14/17 1217  Skin Integrity Goals  STG: Pt will be able to perform incision/wound care with assistance 1-Total assistance    Problem: RH SAFETY Goal: RH STG ADHERE TO SAFETY PRECAUTIONS W/ASSISTANCE/DEVICE STG Adhere to Safety Precautions With mod assist Assistance/Device.   Outcome: Progressing  07/14/17 1217  Safety Goals  STG:Pt will adhere to safety precautions with assistance/device 6-Modified independent   Goal: RH STG DECREASED RISK OF FALL WITH ASSISTANCE STG Decreased Risk of Fall With mod Assistance.   Outcome: Progressing  07/14/17 1217  Safety Goals  BWI:OMBTDHRCB risk of fall with assistance/device 6-Modified independent

## 2017-07-14 NOTE — Progress Notes (Signed)
Occupational Therapy Session Note  Patient Details  Name: Anna Hayes MRN: [id number] Date of Birth: 1951/07/05  Today's Date: 07/14/2017 OT Individual Time: 6314-9702 OT Individual Time Calculation (min): 49 min  and Today's Date: 07/14/2017 OT Missed Time: 11 Minutes Missed Time Reason: Patient fatigue   Short Term Goals: Week 1:  OT Short Term Goal 1 (Week 1): Pt will complete a transfer to toilet with mod A. OT Short Term Goal 2 (Week 1): Pt will don pants from bed level with mod A. OT Short Term Goal 3 (Week 1): pt will be able to lateral lean to doff pants while sitting on toilet with mod A.  Skilled Therapeutic Interventions/Progress Updates:    Pt resting semi-supine in bed upon OT arrival and reported fatigue from yesterday.  Pt agreeable to participating in ADL and functional mobility retraining to improve functional safety and I with BADLs.  Pt transitioned to sitting EOB with Mod A to manage trunk and assist with scooting EOB.  Pt completed stand pivot transfer towards R side with Max A for initial lift and to guide to w/c.  Pt completed UB and partial LB bathing from w/c level at sink.  Pt donned shirt with A to adjust on L trunk.  Pt stood with Mod A at sink for OT to complete peri-care (partial incontinent BM).  Pt needed to return to sitting, OT assisted in threading pants, pt stood once more to complete peri-care and for OT to don pants over hips.  Pt unable to maintain standing balance at this time to participate in clothing management while standing.  Pt visibly fatigued following multiple sit to stands and required prolonged rest break before participating in grooming tasks from w/c level.  Pt completed grooming with setup of supplies.  11 minutes of therapy missed due to fatigue.  Pt left seated in w/c with call light and all other needs within reach.  Therapy Documentation Precautions:  Precautions Precautions: Fall Precaution Comments: LUE hemi  plegic Restrictions Weight Bearing Restrictions: Yes LLE Weight Bearing: Non weight bearing General: General OT Amount of Missed Time: 11 Minutes Pain: Pain Assessment Pain Assessment: 0-10 Pain Score: 6  Faces Pain Scale: No hurt Pain Type: Acute pain Pain Location: Other (Comment) (Residual limb) Pain Orientation: Left Pain Descriptors / Indicators: Aching Pain Frequency: Constant Pain Onset: On-going Patients Stated Pain Goal: 2 Pain Intervention(s): Repositioned ADL: ADL ADL Comments: Refer to functional navigator  See Function Navigator for Current Functional Status.   Therapy/Group: Individual Therapy  Marcella Dubs 07/14/2017, 12:30 PM

## 2017-07-14 NOTE — Progress Notes (Signed)
Social Work  Social Work Assessment and Plan  Patient Details  Name: Anna Hayes MRN: [id number] Date of Birth: 07-29-1951  Today's Date: 07/14/2017  Problem List:  Patient Active Problem List   Diagnosis Date Noted  . Hypokalemia   . Pain and swelling of right lower leg   . Amputation of left lower extremity above knee upon examination ([place]) 07/12/2017  . Unilateral AKA, left ([place])   . Neuropathic pain   . Fungemia   . Benign essential HTN   . Slow transit constipation   . Intra-abdominal abscess ([place])   . Fungal endophthalmitis   . Pressure injury of skin 07/06/2017  . Pelvic abscess in female [facility])   . Pelvic abscess in female   . DNR (do not resuscitate) discussion   . Palliative care by specialist   . Ischemic neuropathy of left foot   . Abdominal pain   . History of CVA with residual deficit   . Tobacco abuse   . Acute blood loss anemia   . Post-operative pain   . Leukocytosis   . PAD (peripheral artery disease) ([place]) 06/27/2017  . Critical lower limb ischemia 06/27/2017  . SBO (small bowel obstruction) ([place]) 06/27/2017  . Intraabdominal fluid collection absess 06/27/2017  . Candidemia ([place]) 06/27/2017  . Stroke [facility])    Past Medical History:  Past Medical History:  Diagnosis Date  . Frequent headaches   . Hypertension   . Muscle pain   . Palpitations   . Sinus problem   . Stroke [facility])    Past Surgical History:  Past Surgical History:  Procedure Laterality Date  . AMPUTATION Left 07/05/2017   Procedure: LEFT ABOVE KNEE AMPUTATION;  Surgeon: Conrad Omak, MD;  Location: [place];  Service: Vascular;  Laterality: Left;  . EMBOLECTOMY Bilateral 06/27/2017   Procedure: BILATERAL FEMORAL POPLITEAL EMBOLECTOMY;  Surgeon: Serafina Mitchell, MD;  Location: [facility] OR;  Service: Vascular;  Laterality: Bilateral;  . FASCIOTOMY Left 06/27/2017   Procedure: FULL COMPARTMENT LEFT LOWER LEG FASCIOTOMY;  Surgeon: Serafina Mitchell, MD;  Location: [place];  Service:  Vascular;  Laterality: Left;  . PATCH ANGIOPLASTY Left 06/27/2017   Procedure: PATCH ANGIOPLASTY Left Posterior Tibial Artery;  Surgeon: Serafina Mitchell, MD;  Location: [place];  Service: Vascular;  Laterality: Left;  . 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 07/11/2017   Procedure: REPAIR OF COMPLEX TRACTION RETINAL DETACHMENT WITH ENDO LASER AND ANTIFUNGAL INJECTION;  Surgeon: Jalene Mullet, MD;  Location: [place];  Service: Ophthalmology;  Laterality: Right;  . TEE WITHOUT CARDIOVERSION N/A 07/11/2017   Procedure: TRANSESOPHAGEAL ECHOCARDIOGRAM (TEE);  Surgeon: Acie Fredrickson Wonda Cheng, MD;  Location: [facility] ENDOSCOPY;  Service: Cardiovascular;  Laterality: N/A;   Social History:  reports that she has been smoking Cigarettes.  She has been smoking about 0.50 packs per day. She has never used smokeless tobacco. She reports that she does not drink alcohol or use drugs.  Family / Support Systems Marital Status: Widow/Widower How Long?: 9 yrs Patient Roles: Parent, Other (Comment) (grandparent) Children: daughter, [name] @ [phone] Other Supports: grandson, Thea Silversmith Anticipated Caregiver: daughter and 67 year old grandson Ability/Limitations of Caregiver: grandson with pt when daughter at work Caregiver Availability: 24/7 Family Dynamics: Pt describes family as very supportive.    Social History Preferred language: English Religion: Baptist Cultural Background: NA Read: Yes Write: Yes Employment Status: Disabled Date Retired/Disabled/Unemployed: 2003 with stroke Legal Hisotry/Current Legal Issues: None Guardian/Conservator: None - per MD, pt  is capable of making decisions on her own behalf   Abuse/Neglect Physical Abuse: Denies Verbal Abuse: Denies Sexual Abuse: Denies Exploitation of patient/patient's resources: Denies Self-Neglect: Denies  Emotional Status Pt's affect, behavior adn adjustment status: Pt able to complete assessment interview without any  difficulty.  Soft speech and offers only brief answers.  She denies any significant emotional distress and references her stroke recovery when making point that she will "get through this too." Recent Psychosocial Issues: Pt independent prior to decline beginning in Sept Pyschiatric History: None Substance Abuse History: None  Patient / Family Perceptions, Expectations & Goals Pt/Family understanding of illness & functional limitations: Pt and family with basic understanding of medical issues leading to AKA and of current functional limitations/ need for CIR. Premorbid pt/family roles/activities: Pt was independent and had learned to compensate well with residual stroke related deficits. Anticipated changes in roles/activities/participation: Family will need to increase support  Pt/family expectations/goals: "I'm wanting to get home as soon as I can."  [employer]: None Premorbid Home Care/DME Agencies: Other (Comment) (following CVA in 2003) Transportation available at discharge: yes Resource referrals recommended: Support group (specify)  Discharge Planning Living Arrangements: Children, Other relatives Support Systems: Children, Other relatives Type of Residence: Private residence Insurance Resources: Medicare ([place] Medicare) Financial Resources: Radio broadcast assistant Screen Referred: No Living Expenses: Rent Money Management: Family Does the patient have any problems obtaining your medications?: No Home Management: pt, daughter and grandson Patient/Family Preliminary Plans: Pt to return home with family providing 24/7 support Social Work Anticipated Follow Up Needs: HH/OP Expected length of stay: 12-14 days  Clinical Impression Pleasant, elderly woman here following AKA.  Residual deficits from prior CVA in 2003.  FAmily able to provide 24/7 support and pt very motivated to get home "as soon as possible."  She denies any significant emotional  distress.  Will follow for support and d/c planning needs.    Kairo Laubacher 07/14/2017, 1:47 PM

## 2017-07-14 NOTE — Progress Notes (Signed)
Physical Therapy Session Note  Patient Details  Name: Anna Hayes MRN: [id number] Date of Birth: 10/19/1950  Today's Date: 07/14/2017 PT Individual Time: 1100-1200 PT Individual Time Calculation (min): 60 min   Short Term Goals: Week 1:  PT Short Term Goal 1 (Week 1): pt will roll R with min assist PT Short Term Goal 2 (Week 1): pt will move supine<> sit with mod assist, using bed features PT Short Term Goal 3 (Week 1): pt will stand using bed rail with mod assist PT Short Term Goal 4 (Week 1): pt will propel appropriate -height w/c x 150' with supervision  Skilled Therapeutic Interventions/Progress Updates:    no c/o pain.  Session focus on LLE strength/flexibility, transfers, and w/c positioning.    Pt transfers throughout session to R and L with max assist, best attempt mod assist to R back to bed.  Sit>supine on therapy mat and in bed with supervision, requires min assist to come to sitting on flat therapy mat.  PT instructed pt in supine hip flexor stretch, 2x10 reps SLR and hip abd/add to midline, and 3x30 second L hip adductor stretch.    PT adjusted wheel locks on w/c and provided pt with ROHO hybrid elite cushion for improved position, transfers, and pressure relief in sitting.    Pt returned to room at end of session and positioned back to bed with call bell in reach and needs met.   Therapy Documentation Precautions:  Precautions Precautions: Fall Precaution Comments: LUE hemi plegic Restrictions Weight Bearing Restrictions: Yes LLE Weight Bearing: Non weight bearing   See Function Navigator for Current Functional Status.   Therapy/Group: Individual Therapy  Caitlin E Warren 07/14/2017, 12:16 PM  

## 2017-07-14 NOTE — Progress Notes (Signed)
Physical Therapy Session Note  Patient Details  Name: Anna Hayes MRN: [id number] Date of Birth: Jun 04, 1951  Today's Date: 07/14/2017 PT Individual Time: 1620-1700 PT Individual Time Calculation (min): 40 min   Short Term Goals: Week 1:  PT Short Term Goal 1 (Week 1): pt will roll R with min assist PT Short Term Goal 2 (Week 1): pt will move supine<> sit with mod assist, using bed features PT Short Term Goal 3 (Week 1): pt will stand using bed rail with mod assist PT Short Term Goal 4 (Week 1): pt will propel appropriate -height w/c x 150' with supervision  Skilled Therapeutic Interventions/Progress Updates:   Seated RLE and RUE therex. UE all completed with 3# weight x 15 each: chest press, shoulder press, bicep curls, tricep extension. LE with level 2 tband x 12 each: LAQ, HS curl, hip abduction, reciprocal marches, hips extension.  Pt required multiple rest breaks due to excessive fatigue.    Bed mobility with mod assist to come to sitting EOB. Moderate assist for reciprocal scooting to prevent sheer on skin damage. Min assist from PT for sit>supine following treatment. Once in supine PT also provided min assist for scooting to [facility] with heavy use of bed features.      Therapy Documentation Precautions:  Precautions Precautions: Fall Precaution Comments: LUE hemi plegic Restrictions Weight Bearing Restrictions: Yes LLE Weight Bearing: Non weight bearing General:   pain: 0/10 at rest  See Function Navigator for Current Functional Status.   Therapy/Group: Individual Therapy  [name] 07/14/2017, 5:39 PM

## 2017-07-15 ENCOUNTER — Inpatient Hospital Stay (HOSPITAL_COMMUNITY): Payer: Medicare Other

## 2017-07-15 ENCOUNTER — Inpatient Hospital Stay (HOSPITAL_COMMUNITY): Payer: Medicare Other | Admitting: Physical Therapy

## 2017-07-15 DIAGNOSIS — I10 Essential (primary) hypertension: Secondary | ICD-10-CM

## 2017-07-15 DIAGNOSIS — M79609 Pain in unspecified limb: Secondary | ICD-10-CM

## 2017-07-15 DIAGNOSIS — M79661 Pain in right lower leg: Secondary | ICD-10-CM

## 2017-07-15 DIAGNOSIS — M7989 Other specified soft tissue disorders: Secondary | ICD-10-CM

## 2017-07-15 DIAGNOSIS — E876 Hypokalemia: Secondary | ICD-10-CM

## 2017-07-15 MED ORDER — FLUCONAZOLE 200 MG PO TABS
800.0000 mg | ORAL_TABLET | Freq: Every day | ORAL | Status: DC
  Administered 2017-07-15 – 2017-07-28 (×14): 800 mg via ORAL
  Filled 2017-07-15: qty 8
  Filled 2017-07-15: qty 4
  Filled 2017-07-15 (×2): qty 8
  Filled 2017-07-15: qty 4
  Filled 2017-07-15: qty 8
  Filled 2017-07-15: qty 4
  Filled 2017-07-15: qty 8
  Filled 2017-07-15 (×2): qty 4
  Filled 2017-07-15: qty 8
  Filled 2017-07-15: qty 4
  Filled 2017-07-15: qty 8
  Filled 2017-07-15 (×6): qty 4
  Filled 2017-07-15: qty 8

## 2017-07-15 NOTE — Progress Notes (Signed)
[place] PHYSICAL MEDICINE & REHABILITATION     PROGRESS NOTE    Subjective/Complaints: Pt seen laying in bed this AM.  She slept well overnight.  She states therapies are going well.  Later informed by therapies about pain, edema, and warmth in extremity.   ROS: Denies nausea, vomiting, diarrhea, shortness of breath or chest pain   Objective: Vital Signs: Blood pressure 110/65, pulse (!) 103, temperature 99.3 F (37.4 C), temperature source Oral, resp. rate 18, height 5' 2.01" (1.575 m), weight 63 kg (138 lb 14.2 oz), SpO2 99 %. No results found.  Recent Labs  07/13/17 0607  WBC 9.0  HGB 9.0*  HCT 28.8*  PLT 330    Recent Labs  07/13/17 0607  NA 137  K 3.3*  CL 104  GLUCOSE 87  BUN 7  CREATININE 0.65  CALCIUM 8.3*   CBG (last 3)  No results for input(s): GLUCAP in the last 72 hours.  Wt Readings from Last 3 Encounters:  07/13/17 63 kg (138 lb 14.2 oz)  07/11/17 64 kg (141 lb)    Physical Exam:  Constitutional: She appears well-developed. Obese  HENT: Normocephalic and atraumatic.  Eyes: EOM are normal. Right eye exhibits no discharge. Left eye exhibits no discharge.  Neck: Normal range of motion. Neck supple.  Cardiovascular: RRR. No JVD    Respiratory: CTA Bilaterally. Normal effort. GI:  BS +, non-tender, non-distended.  Musculoskeletal: She exhibits edema and tenderness.  Left AKA tender to touch.   Neurological: She is alert.  Left facial weakness with minimal dysarthria.  LUE with edema  Able to follow basic commands without difficulty.  Motor: RUE: 5/5 proximal to distal LUE: 0/5 proximal to distal with tone and contracture in wrist and fingers RLE: HF, KE 4/5, ADF/PF 4/5 (stable, pain inhibition) LLE: HF 4/5 (pain inhibition)  Skin:  +VAC abdomen remains in place. AK incision with dressing c/d/i. RLE dry without breakdown, incision healed Psychiatric: Her affect is pleasant.  Assessment/Plan: 1. Functional deficits secondary to left AKA  which require 3+ hours per day of interdisciplinary therapy in a comprehensive inpatient rehab setting. Physiatrist is providing close team supervision and 24 hour management of active medical problems listed below. Physiatrist and rehab team continue to assess barriers to discharge/monitor patient progress toward functional and medical goals.  Function:  Bathing Bathing position   Position: Wheelchair/chair at sink  Bathing parts Body parts bathed by patient: Chest, Left arm, Abdomen, Front perineal area, Right upper leg, Left upper leg, Right lower leg Body parts bathed by helper: Right arm, Buttocks, Back  Bathing assist Assist Level: Touching or steadying assistance(Pt > 75%)      Upper Body Dressing/Undressing Upper body dressing   What is the patient wearing?: Pull over shirt/dress     Pull over shirt/dress - Perfomed by patient: Thread/unthread right sleeve, Put head through opening, Pull shirt over trunk Pull over shirt/dress - Perfomed by helper: Thread/unthread left sleeve        Upper body assist Assist Level: Touching or steadying assistance(Pt > 75%)      Lower Body Dressing/Undressing Lower body dressing Lower body dressing/undressing activity did not occur: N/A What is the patient wearing?: Pants, Non-skid slipper socks       Pants- Performed by helper: Thread/unthread right pants leg, Thread/unthread left pants leg, Pull pants up/down   Non-skid slipper socks- Performed by helper: Don/doff right sock  Lower body assist Assist for lower body dressing: Touching or steadying assistance (Pt > 75%)      Toileting Toileting Toileting activity did not occur: No continent bowel/bladder event        Toileting assist     Transfers Chair/bed transfer Chair/bed transfer activity did not occur: Safety/medical concerns Chair/bed transfer method: Squat pivot Chair/bed transfer assist level: Maximal assist (Pt 25 - 49%/lift and  lower) Chair/bed transfer assistive device: Bedrails     Locomotion Ambulation Ambulation activity did not occur: Safety/medical concerns (dizzy with standing; LUE hemi)         Wheelchair Wheelchair activity did not occur: N/A Type: Manual Max wheelchair distance: 50 Assist Level: Moderate assistance (Pt 50 - 74%)  Cognition Comprehension Comprehension assist level: Follows complex conversation/direction with no assist  Expression Expression assist level: Expresses complex ideas: With no assist  Social Interaction Social Interaction assist level: Interacts appropriately with others - No medications needed.  Problem Solving Problem solving assist level: Solves complex problems: Recognizes & self-corrects  Memory Memory assist level: Complete Independence: No helper   Medical Problem List and Plan: 1.  Decreased functional mobility secondary to left AKA10/06/2017 as well as history of CVA with left-sided residual weakness.  Cont CIR  -Stump sock/belt ordered  2.  DVT Prophylaxis/Anticoagulation: Intravenous heparin transitioned to Xarelto on 07/12/2017  RLE with calf edema, warmth, tenderness.  Dopplers ordered 3. Pain Management: Neurontin 300 mg twice a day,Oxycodone as needed  -pre-treat with oxycodone prior to therapies 4. Mood:  Provide emotional support 5. Neuropsych: This patient is capable of making decisions on her own behalf. 6. Skin/Wound Care:  Routine skin checks 7. Fluids/Electrolytes/Nutrition:  Routine I/Os 8.Abdominal abscesses. Status post laparotomy, lysis of adhesions repair perforated bowel 06/19/2017 at [facility] post percutaneous drain placement October 1 and removed 07/11/2017.Continue Vantin 200 mg every 12 hours, Flagyl 500 mg every 8 hours. ID TO F/U ON DURATION OF ANTIBIOTICS 9.Fungemia with endophthalmitis.follow-up ophthalmology services.Intravenous fluconazole 800 mg daily with plan to change to oral therapy per infectious disease with  plan for 6 weeks of therapy. Status post Pars Plana Vitrectomy, membrane peeling 07/11/2017  10.Acute blood loss anemia.   hgb 9.0 on 10/18.   Continue iron supplement  -Labs ordered for Monday 11.Hypertension. Norvasc 5 mg daily  Controlled 10/20 12.tobacco abuse. Counseling 13. Constipation. Laxative assistance  -needs bm today. Last one 10/16 14. Hypokalemia  K+ 3.3 on 10/18  Cont supplementation  Labs ordered for Monday   LOS (Days) 3 A FACE TO FACE EVALUATION WAS PERFORMED  Camilla Skeen [name], MD 07/15/2017 4:16 PM

## 2017-07-15 NOTE — Progress Notes (Signed)
Alerted by PT Amy that pt complaining of calf pain. PT reports calf warm and tender to touch. Pt with hx of DVT. MD notified and new orders received for Doppler study of RLE. Pt. Placed on bed rest until further notice.

## 2017-07-15 NOTE — Progress Notes (Signed)
Occupational Therapy Session Note  Patient Details  Name: Anna Hayes MRN: [id number] Date of Birth: December 13, 1950  Today's Date: 07/15/2017 OT Individual Time: 0254-2706 OT Individual Time Calculation (min): 56 min    Short Term Goals: Week 1:  OT Short Term Goal 1 (Week 1): Pt will complete a transfer to toilet with mod A. OT Short Term Goal 2 (Week 1): Pt will don pants from bed level with mod A. OT Short Term Goal 3 (Week 1): pt will be able to lateral lean to doff pants while sitting on toilet with mod A.  Skilled Therapeutic Interventions/Progress Updates:    1:1. Pt supine to sitting EOB with MIN A for trunk elevation. Pt requires increased rest break after all transitional movement 2/2 decreased endurance. Pt stand pivot transfer with MOD A and VC for hand placement throuhgout session and manual facilitaion of weight shift. Pt bathes at sit to stand level with A to wash buttocks, RUE, and R foot. Pt sit to stand throughout session for clothing management and peri care with MOD A 3x during session with Vc for hip extension. Pt threads RLE into pants with LE elvated on stool and OT advances pants past hips in standing wit MOD A for balance. Exited session with pt seated in bed with call lgiht in reach and all needs met.   Therapy Documentation Precautions:  Precautions Precautions: Fall Precaution Comments: LUE hemi plegic Restrictions Weight Bearing Restrictions: Yes LLE Weight Bearing: Non weight bearing  See Function Navigator for Current Functional Status.   Therapy/Group: Individual Therapy  Tonny Branch 07/15/2017, 9:22 AM

## 2017-07-15 NOTE — Progress Notes (Signed)
VASCULAR LAB PRELIMINARY  PRELIMINARY  PRELIMINARY  PRELIMINARY  Right lower extremity venous duplex completed.    Preliminary report:  There is no DVT or SVT noted in the right lower extremity.   Raudel Bazen, RVT 07/15/2017, 6:37 PM

## 2017-07-15 NOTE — Progress Notes (Signed)
Physical Therapy Session Note  Patient Details  Name: Anna Hayes MRN: [id number] Date of Birth: 01-Nov-1950  Today's Date: 07/15/2017 PT Individual Time: 1105-1145 PT Individual Time Calculation (min): 40 min  and Today's Date: 07/15/2017 PT Missed Time: 20 Minutes Missed Time Reason: MD hold (Comment) (bedrest, waiting for dopplers for RLE)  Short Term Goals: Week 1:  PT Short Term Goal 1 (Week 1): pt will roll R with min assist PT Short Term Goal 2 (Week 1): pt will move supine<> sit with mod assist, using bed features PT Short Term Goal 3 (Week 1): pt will stand using bed rail with mod assist PT Short Term Goal 4 (Week 1): pt will propel appropriate -height w/c x 150' with supervision  Skilled Therapeutic Interventions/Progress Updates:   Pt supine upon arrival and agreeable to therapy, no c/o pain. Transferred to EOB and to w/c via squat pivot w/ Max A and increased time w/ frequent rest breaks. Encouraged pt to use RLE for WB during transfer. Total A w/c mobility to gym to work on functional strength w/ sit>stands in parallel bars. Performed 2 sit<>stands and pt c/o severe pain in R calf when standing. PT noticed posterior calf skin was hot to the touch compared to surrounding areas on legs in addition to a positive calf squeeze test for DVT. RN alerted at which point RN requested PT to take pt back to bed and end session early. RN then verbally reported MD requesting medical hold/bed rest until dopplers were completed. Returned to room in w/c, transferred to EOB w/ Max A and then supine. Ended session in supine, call bell within reach and all needs met.    Therapy Documentation Precautions:  Precautions Precautions: Fall Precaution Comments: LUE hemi plegic Restrictions Weight Bearing Restrictions: Yes LLE Weight Bearing: Non weight bearing General: PT Amount of Missed Time (min): 20 Minutes PT Missed Treatment Reason: MD hold (Comment) (bedrest, waiting for dopplers for  RLE)  See Function Navigator for Current Functional Status.   Therapy/Group: Individual Therapy  [name] 07/15/2017, 12:40 PM

## 2017-07-15 NOTE — Progress Notes (Signed)
Occupational Therapy Session Note  Patient Details  Name: Anna Hayes MRN: [id number] Date of Birth: June 01, 1951  Today's Date: 07/15/2017 OT Missed Time: 95 Minutes Missed Time Reason: MD hold (comment);Patient on bedrest   Pt missed 75 min skilled OT services 2/2 pt on bedrest/MD hold awaiting doppler test for DVT.   See Function Navigator for Current Functional Status.   Therapy/Group: Individual Therapy  Tonny Branch 07/15/2017, 3:37 PM

## 2017-07-16 NOTE — Progress Notes (Signed)
[place] PHYSICAL MEDICINE & REHABILITATION     PROGRESS NOTE    Subjective/Complaints: Pt seen laying in bed this AM.  She stats she slept great overnight.  She states her calf feels much better.   ROS: Denies nausea, vomiting, diarrhea, shortness of breath or chest pain   Objective: Vital Signs: Blood pressure 131/65, pulse 97, temperature 98.5 F (36.9 C), temperature source Oral, resp. rate 18, height 5' 2.01" (1.575 m), weight 63 kg (138 lb 14.2 oz), SpO2 99 %. No results found. No results for input(s): WBC, HGB, HCT, PLT in the last 72 hours. No results for input(s): NA, K, CL, GLUCOSE, BUN, CREATININE, CALCIUM in the last 72 hours.  Invalid input(s): CO CBG (last 3)  No results for input(s): GLUCAP in the last 72 hours.  Wt Readings from Last 3 Encounters:  07/13/17 63 kg (138 lb 14.2 oz)  07/11/17 64 kg (141 lb)    Physical Exam:  Constitutional: She appears well-developed. Obese  HENT: Normocephalic and atraumatic.  Eyes: EOM are normal. Right eye exhibits no discharge. Left eye exhibits no discharge.  Neck: Normal range of motion. Neck supple.  Cardiovascular: RRR. No JVD    Respiratory: CTA Bilaterally. Normal effort. GI:  BS +, non-tender, non-distended.  Musculoskeletal: She exhibits edema and tenderness.  Left AKA tender to touch.   Minimal TTP right calf Neurological: She is alert.  Left facial weakness with minimal dysarthria.  Able to follow basic commands without difficulty.  Motor: RUE: 5/5 proximal to distal LUE: 0/5 proximal to distal with tone and contracture in wrist and fingers RLE: HF, KE 4/5, ADF/PF 4/5 (stable, pain inhibition) LLE: HF 4+/5 (pain inhibition)  Skin:  +VAC abdomen remains in place. AK incision with dressing c/d/i. RLE dry without breakdown, incision healed Psychiatric: Her affect is pleasant.  Assessment/Plan: 1. Functional deficits secondary to left AKA which require 3+ hours per day of interdisciplinary therapy in a  comprehensive inpatient rehab setting. Physiatrist is providing close team supervision and 24 hour management of active medical problems listed below. Physiatrist and rehab team continue to assess barriers to discharge/monitor patient progress toward functional and medical goals.  Function:  Bathing Bathing position   Position: Wheelchair/chair at sink  Bathing parts Body parts bathed by patient: Chest, Left arm, Abdomen, Front perineal area, Right upper leg, Left upper leg, Right lower leg Body parts bathed by helper: Right arm, Buttocks, Back  Bathing assist Assist Level: Touching or steadying assistance(Pt > 75%)      Upper Body Dressing/Undressing Upper body dressing   What is the patient wearing?: Pull over shirt/dress     Pull over shirt/dress - Perfomed by patient: Thread/unthread right sleeve, Put head through opening, Pull shirt over trunk Pull over shirt/dress - Perfomed by helper: Thread/unthread left sleeve        Upper body assist Assist Level: Touching or steadying assistance(Pt > 75%)      Lower Body Dressing/Undressing Lower body dressing Lower body dressing/undressing activity did not occur: N/A What is the patient wearing?: Pants, Non-skid slipper socks       Pants- Performed by helper: Thread/unthread right pants leg, Thread/unthread left pants leg, Pull pants up/down   Non-skid slipper socks- Performed by helper: Don/doff right sock                  Lower body assist Assist for lower body dressing: Touching or steadying assistance (Pt > 75%)      Toileting Toileting Toileting activity did not  occur: No continent bowel/bladder event        Toileting assist     Transfers Chair/bed transfer Chair/bed transfer activity did not occur: Safety/medical concerns Chair/bed transfer method: Squat pivot Chair/bed transfer assist level: Maximal assist (Pt 25 - 49%/lift and lower) Chair/bed transfer assistive device: Bedrails      Locomotion Ambulation Ambulation activity did not occur: Safety/medical concerns (dizzy with standing; LUE hemi)         Wheelchair Wheelchair activity did not occur: N/A Type: Manual Max wheelchair distance: 50 Assist Level: Moderate assistance (Pt 50 - 74%)  Cognition Comprehension Comprehension assist level: Follows complex conversation/direction with no assist  Expression Expression assist level: Expresses complex ideas: With no assist  Social Interaction Social Interaction assist level: Interacts appropriately with others - No medications needed.  Problem Solving Problem solving assist level: Solves complex problems: Recognizes & self-corrects  Memory Memory assist level: Complete Independence: No helper   Medical Problem List and Plan: 1.  Decreased functional mobility secondary to left AKA10/06/2017 as well as history of CVA with left-sided residual weakness.  Resume CIR  -Stump sock/belt ordered  2.  DVT Prophylaxis/Anticoagulation: Intravenous heparin transitioned to Xarelto on 07/12/2017  Dopplers neg for DVT 3. Pain Management: Neurontin 300 mg twice a day,Oxycodone as needed  -pre-treat with oxycodone prior to therapies 4. Mood:  Provide emotional support 5. Neuropsych: This patient is capable of making decisions on her own behalf. 6. Skin/Wound Care:  Routine skin checks 7. Fluids/Electrolytes/Nutrition:  Routine I/Os 8.Abdominal abscesses. Status post laparotomy, lysis of adhesions repair perforated bowel 06/19/2017 at [facility] post percutaneous drain placement October 1 and removed 07/11/2017.Continue Vantin 200 mg every 12 hours, Flagyl 500 mg every 8 hours. ID TO F/U ON DURATION OF ANTIBIOTICS 9.Fungemia with endophthalmitis.follow-up ophthalmology services.Intravenous fluconazole 800 mg daily with plan to change to oral therapy per infectious disease with plan for 6 weeks of therapy. Status post Pars Plana Vitrectomy, membrane peeling 07/11/2017   10.Acute blood loss anemia.   hgb 9.0 on 10/18.   Continue iron supplement  Labs ordered for tomorrow 11.Hypertension. Norvasc 5 mg daily  Controlled 10/21 12.tobacco abuse. Counseling 13. Constipation. Laxative assistance  -needs bm today. Last one 10/16 14. Hypokalemia  K+ 3.3 on 10/18  Cont supplementation  Labs ordered for tomorrow   LOS (Days) 4 A FACE TO FACE EVALUATION WAS PERFORMED  Ankit [name], MD 07/16/2017 7:53 AM

## 2017-07-16 NOTE — Progress Notes (Signed)
Patient c/o bed bed being warm towards the feet area. RN checked the airmatt and it did feel warmer than usual. Facilities paged. Bed has been replaced. Few hours after, RN was informed that the bed needs to be changed as the bed is for bariatric patient. Patient already sleeping then and advised to have bed replaced in the morning when patient is awake.

## 2017-07-17 ENCOUNTER — Inpatient Hospital Stay (HOSPITAL_COMMUNITY): Payer: Medicare Other | Admitting: Occupational Therapy

## 2017-07-17 ENCOUNTER — Inpatient Hospital Stay (HOSPITAL_COMMUNITY): Payer: Medicare Other

## 2017-07-17 LAB — BASIC METABOLIC PANEL WITH GFR
Anion gap: 9 (ref 5–15)
BUN: 5 mg/dL — ABNORMAL LOW (ref 6–20)
CO2: 24 mmol/L (ref 22–32)
Calcium: 8.3 mg/dL — ABNORMAL LOW (ref 8.9–10.3)
Chloride: 102 mmol/L (ref 101–111)
Creatinine, Ser: 0.63 mg/dL (ref 0.44–1.00)
GFR calc Af Amer: >60 mL/min
GFR calc non Af Amer: >60 mL/min
Glucose, Bld: 93 mg/dL (ref 65–99)
Potassium: 3.8 mmol/L (ref 3.5–5.1)
Sodium: 135 mmol/L (ref 135–145)

## 2017-07-17 LAB — CBC
HEMATOCRIT: 28.3 % — AB (ref 36.0–46.0)
HEMOGLOBIN: 8.8 g/dL — AB (ref 12.0–15.0)
MCH: 25 pg — ABNORMAL LOW (ref 26.0–34.0)
MCHC: 31.1 g/dL (ref 30.0–36.0)
MCV: 80.4 fL (ref 78.0–100.0)
Platelets: 427 10*3/uL — ABNORMAL HIGH (ref 150–400)
RBC: 3.52 MIL/uL — ABNORMAL LOW (ref 3.87–5.11)
RDW: 20.6 % — ABNORMAL HIGH (ref 11.5–15.5)
WBC: 7.9 10*3/uL (ref 4.0–10.5)

## 2017-07-17 NOTE — Consult Note (Signed)
[facility] Nurse wound consult note  Reason for Consult: Vac dressing change to abd wound Wound type: Full thickness post-op wound with sutures visible to inner wound bed Wound bed: Appearance unchanged since previous assessment.  90% red and moist, 10% yellow slough to wound edges, decreasing amt of undermining to wound edges. Wound is becoming more shallow, so plan to discontinue use of white foam. Drainage (amount, consistency, odor) modamt tandrainage in the cannister, no odor Periwound: Intact skin surrounding Dressing procedure/placement/frequency: Applied Mepitel contact layer, then one piece black foam. Pt medicated for pain prior to the procedure and tolerated with mod amt discomfort. WOC will plan to change abd Vac dressing Q M/W/F at 0800. Discussed plan of care and pt verbalized understanding. Julien Girt MSN, RN, [place], [place], [place]

## 2017-07-17 NOTE — Progress Notes (Signed)
Physical Therapy Note  Patient Details  Name: Anna Hayes MRN: [id number] Date of Birth: 03-Mar-1951 Today's Date: 07/17/2017  [id number], 40 min individual tx Pain: "little bit" R calf, R shoulder, declined meds  W/c propulsion with mod assist for steering on level tile and carpet,  hemi method. Pt locked and unlocked R brake with 1 cues; needed assistance for L brake.  Strengthening core and RLE using Kinetron at level 40 with PT assistance, x 20 cycles x 2, leaning forward in order to fully load RLE.  Sit> stand at sturfdy table x 10 seconds, max assist.  Transfer training for forward wt shift, head/hips relationship, pushing iwht RUE/RLe to elevate hips.  Pt left resting in w/c with dtr in room, and all needs within reach.  Pt would benefit from ultra hemi ht w/c in order to reach foot to floor better.   See function navigator for current status.  Sher Shampine 07/17/2017, 11:43 AM

## 2017-07-17 NOTE — Progress Notes (Signed)
[facility] PHYSICAL MEDICINE & REHABILITATION     PROGRESS NOTE    Subjective/Complaints: No new complaints. Had reasonable weekend. Pain in left leg noted  ROS: pt denies nausea, vomiting, diarrhea, cough, shortness of breath or chest pain  Objective: Vital Signs: Blood pressure 131/66, pulse (!) 102, temperature 98.6 F (37 C), temperature source Oral, resp. rate 18, height 5' 2.01" (1.575 m), weight 63 kg (138 lb 14.2 oz), SpO2 100 %. No results found.  Recent Labs  07/17/17 0652  WBC 7.9  HGB 8.8*  HCT 28.3*  PLT 427*    Recent Labs  07/17/17 0652  NA 135  K 3.8  CL 102  GLUCOSE 93  BUN 5*  CREATININE 0.63  CALCIUM 8.3*   CBG (last 3)  No results for input(s): GLUCAP in the last 72 hours.  Wt Readings from Last 3 Encounters:  07/13/17 63 kg (138 lb 14.2 oz)  07/11/17 64 kg (141 lb)    Physical Exam:  Constitutional: She appears well-developed. Obese  HENT: Normocephalic and atraumatic.  Eyes: EOM are normal. Right eye exhibits no discharge. Left eye exhibits no discharge.  Neck: Normal range of motion. Neck supple.  Cardiovascular: RRR without murmur. No JVD     Respiratory: CTA Bilaterally without wheezes or rales. Normal effort  GI:  BS +, non-tender, non-distended.  Musculoskeletal: She exhibits edema and tenderness.  Left AKA tremains tender to palpation   Neurological: She is alert.  Left facial weakness with minimal dysarthria.  LUE with edema  Able to follow basic commands without difficulty.  Motor: RUE: 5/5 proximal to distal LUE: 0/5 proximal to distal with tone and contracture in wrist and fingers RLE: HF, KE 4/5, ADF/PF 4/5 (unchanged) LLE: HF 4/5 (pain inhibition)  Skin:  +VAC abdomen remains in place. AK incision with dressing c/d/i. RLE dry without breakdown, incision healed Psychiatric: Her affect is pleasant.  Assessment/Plan: 1. Functional deficits secondary to left AKA which require 3+ hours per day of interdisciplinary therapy  in a comprehensive inpatient rehab setting. Physiatrist is providing close team supervision and 24 hour management of active medical problems listed below. Physiatrist and rehab team continue to assess barriers to discharge/monitor patient progress toward functional and medical goals.  Function:  Bathing Bathing position   Position: Wheelchair/chair at sink  Bathing parts Body parts bathed by patient: Chest, Left arm, Abdomen, Front perineal area, Right upper leg, Left upper leg, Right lower leg Body parts bathed by helper: Right arm, Buttocks, Back  Bathing assist Assist Level: Touching or steadying assistance(Pt > 75%)      Upper Body Dressing/Undressing Upper body dressing   What is the patient wearing?: Pull over shirt/dress     Pull over shirt/dress - Perfomed by patient: Thread/unthread right sleeve, Put head through opening, Pull shirt over trunk Pull over shirt/dress - Perfomed by helper: Thread/unthread left sleeve        Upper body assist Assist Level: Touching or steadying assistance(Pt > 75%)      Lower Body Dressing/Undressing Lower body dressing Lower body dressing/undressing activity did not occur: N/A What is the patient wearing?: Pants, Non-skid slipper socks       Pants- Performed by helper: Thread/unthread right pants leg, Thread/unthread left pants leg, Pull pants up/down   Non-skid slipper socks- Performed by helper: Don/doff right sock                  Lower body assist Assist for lower body dressing: Touching or steadying assistance (Pt >  75%)      Toileting Toileting Toileting activity did not occur: No continent bowel/bladder event        Toileting assist     Transfers Chair/bed transfer Chair/bed transfer activity did not occur: Safety/medical concerns Chair/bed transfer method: Squat pivot Chair/bed transfer assist level: Maximal assist (Pt 25 - 49%/lift and lower) Chair/bed transfer assistive device: Bedrails      Locomotion Ambulation Ambulation activity did not occur: Safety/medical concerns (dizzy with standing; LUE hemi)         Wheelchair Wheelchair activity did not occur: N/A Type: Manual Max wheelchair distance: 50 Assist Level: Moderate assistance (Pt 50 - 74%)  Cognition Comprehension Comprehension assist level: Follows complex conversation/direction with no assist  Expression Expression assist level: Expresses complex ideas: With no assist  Social Interaction Social Interaction assist level: Interacts appropriately with others - No medications needed.  Problem Solving Problem solving assist level: Solves complex problems: Recognizes & self-corrects  Memory Memory assist level: Complete Independence: No helper   Medical Problem List and Plan: 1.  Decreased functional mobility secondary to left AKA10/06/2017 as well as history of CVA with left-sided residual weakness.  Cont CIR  -Stump sock/belt per [employer]  -team conference tomorrow 2.  DVT Prophylaxis/Anticoagulation: Intravenous heparin transitioned to Xarelto on 07/12/2017  RLE with calf edema, warmth, tenderness.  Dopplers ordered 3. Pain Management: Neurontin 300 mg twice a day,Oxycodone as needed  -pre-treat with oxycodone prior to therapies 4. Mood:  Provide emotional support 5. Neuropsych: This patient is capable of making decisions on her own behalf. 6. Skin/Wound Care:  Routine skin checks. Vac changed today 7. Fluids/Electrolytes/Nutrition:  Routine I/Os 8.Abdominal abscesses. Status post laparotomy, lysis of adhesions repair perforated bowel 06/19/2017 at [facility] post percutaneous drain placement October 1 and removed 07/11/2017.Continue Vantin 200 mg every 12 hours, Flagyl 500 mg every 8 hours. ID TO F/U ON DURATION OF ANTIBIOTICS 9.Fungemia with endophthalmitis.follow-up ophthalmology services.Intravenous fluconazole 800 mg daily with plan to change to oral therapy per infectious disease with plan for 6  weeks of therapy. Status post Pars Plana Vitrectomy, membrane peeling 07/11/2017  10.Acute blood loss anemia.   hgb 8.8 on 10/22.   Continue iron supplement 11.Hypertension. Norvasc 5 mg daily  Controlled 10/22 12.tobacco abuse. Counseling 13. Constipation. Laxative assistance  -needs bm today. Last one 10/16 14. Hypokalemia  K+ 3.89 on 10/22  Cont supplementation      LOS (Days) 5 A FACE TO FACE EVALUATION WAS PERFORMED  Meredith Staggers, MD 07/17/2017 10:31 AM

## 2017-07-17 NOTE — Progress Notes (Signed)
Occupational Therapy Session Note  Patient Details  Name: Anna Hayes MRN: [id number] Date of Birth: 1951-07-05  Today's Date: 07/17/2017 OT Individual Time: 1419-1450 OT Individual Time Calculation (min): 31 min    Short Term Goals: Week 1:  OT Short Term Goal 1 (Week 1): Pt will complete a transfer to toilet with mod A. OT Short Term Goal 2 (Week 1): Pt will don pants from bed level with mod A. OT Short Term Goal 3 (Week 1): pt will be able to lateral lean to doff pants while sitting on toilet with mod A.  Skilled Therapeutic Interventions/Progress Updates:    Pt completed transfer from supine to sit EOB with min assist to begin session, with use of the grab bar.  She then completed squat pivot transfer to the wheelchair with mod assist.  Worked on sit to stand and standing balance at the sink for 3 intervals.  Mod demonstrational cueing and mod assist needed for RLE and hand placement, to push up from the wheelchair and then transition hand to the sink, instead of trying to pull up on the sink.  Increased fear with all transitions but she was able to maintain standing for intervals of 1 minute or less.  Returned to bed at end of session with call button and phone in reach.    Therapy Documentation Precautions:  Precautions Precautions: Fall Precaution Comments: LUE hemi plegic Restrictions Weight Bearing Restrictions: Yes LLE Weight Bearing: Non weight bearing  Pain: Pain Assessment Pain Assessment: No/denies pain ADL: See Function Navigator for Current Functional Status.   Therapy/Group: Individual Therapy  Anna Hayes OTR/L 07/17/2017, 4:06 PM

## 2017-07-17 NOTE — Progress Notes (Signed)
Occupational Therapy Session Note  Patient Details  Name: Anna Hayes MRN: [id number] Date of Birth: 07-30-1951  Today's Date: 07/17/2017 OT Individual Time: 0930-1040 and 1300-1344 OT Individual Time Calculation (min): 70 min and 44 min    Short Term Goals: Week 1:  OT Short Term Goal 1 (Week 1): Pt will complete a transfer to toilet with mod A. OT Short Term Goal 2 (Week 1): Pt will don pants from bed level with mod A. OT Short Term Goal 3 (Week 1): pt will be able to lateral lean to doff pants while sitting on toilet with mod A.  Skilled Therapeutic Interventions/Progress Updates:    Session 1: Upon entering the room, pt supine in bed with no c/o pain. Pt does report fatigue but agreeable to OT intervention this session. Pt requesting to performed bathing and dressing while seated on EOB. Pt performed supine >sit with min A to EOB with sitting balance at close supervision. Pt returning to supine to wash peri area and therapist assisting pt with washing buttocks. LB clothing management from supine as well with total A. Pt required encouragement but agreeable to transferring from bed >wheelchair with max A stand pivot transfer.Pt seated in wheelchair at sink for grooming tasks with set up A. OT wrote schedule in large print and reviewed with pt. Pt remained in wheelchair with call bell and all needed items within reach upon exiting the room.   Session 2: Upon entering the room, pt seated in wheelchair with daughter present and request to return to bed secondary to fatigue. Pt also requesting to go to main entrance in order to see outside. OT assisted pt to wheelchair with her intermittently attempting to propel wheelchair with mod A needed. Pt taking rest break in lobby and assisted back to room. Pt transferred back into bed with max A stand pivot transfer. Pt performed sit >supine with min A for safety. Call bell and all needed items within reach upon exiting the room.   Therapy  Documentation Precautions:  Precautions Precautions: Fall Precaution Comments: LUE hemi plegic Restrictions Weight Bearing Restrictions: Yes LLE Weight Bearing: Non weight bearing  Pain: Pain Assessment Pain Assessment: 0-10 Pain Score: 5  Pain Type: Acute pain Pain Location: Abdomen Pain Orientation: Anterior Pain Intervention(s): Medication (See eMAR) ADL: ADL ADL Comments: Refer to functional navigator  See Function Navigator for Current Functional Status.   Therapy/Group: Individual Therapy  Gypsy Decant 07/17/2017, 10:46 AM

## 2017-07-18 ENCOUNTER — Inpatient Hospital Stay (HOSPITAL_COMMUNITY): Payer: Medicare Other

## 2017-07-18 ENCOUNTER — Inpatient Hospital Stay (HOSPITAL_COMMUNITY): Payer: Medicare Other | Admitting: Occupational Therapy

## 2017-07-18 NOTE — Progress Notes (Signed)
[facility] PHYSICAL MEDICINE & REHABILITATION     PROGRESS NOTE    Subjective/Complaints: Overall doing fairly well. Left leg itching.   ROS: pt denies nausea, vomiting, diarrhea, cough, shortness of breath or chest pain   Objective: Vital Signs: Blood pressure 119/66, pulse 99, temperature 98.7 F (37.1 C), temperature source Oral, resp. rate 16, height 5' 2.01" (1.575 m), weight 63 kg (138 lb 14.2 oz), SpO2 95 %. No results found.  Recent Labs  07/17/17 0652  WBC 7.9  HGB 8.8*  HCT 28.3*  PLT 427*    Recent Labs  07/17/17 0652  NA 135  K 3.8  CL 102  GLUCOSE 93  BUN 5*  CREATININE 0.63  CALCIUM 8.3*   CBG (last 3)  No results for input(s): GLUCAP in the last 72 hours.  Wt Readings from Last 3 Encounters:  07/13/17 63 kg (138 lb 14.2 oz)  07/11/17 64 kg (141 lb)    Physical Exam:  Constitutional: She appears well-developed. Obese  HENT: Normocephalic and atraumatic.  Eyes: EOM are normal. Right eye exhibits no discharge. Left eye exhibits no discharge.  Neck: Normal range of motion. Neck supple.  Cardiovascular: RRR without murmur. No JVD      Respiratory: CTA Bilaterally without wheezes or rales. Normal effort   GI:  abd wound more shallow. Vac in place Musculoskeletal: She exhibits less stump edema   Left AKA tremains tender to palpation   Neurological: She is alert.  Left facial weakness with minimal dysarthria.  LUE with edema  Able to follow basic commands without difficulty.  Motor: RUE: 5/5 proximal to distal LUE: 0/5 proximal to distal with tone and contracture in wrist and fingers RLE: HF, KE 4/5, ADF/PF 4/5 (stable) LLE: HF 4/5 (pain inhibition)  Skin:    AK incision with dressing c/d/i. RLE dry without breakdown, incision healed Psychiatric: Her affect is bright  Assessment/Plan: 1. Functional deficits secondary to left AKA which require 3+ hours per day of interdisciplinary therapy in a comprehensive inpatient rehab setting. Physiatrist  is providing close team supervision and 24 hour management of active medical problems listed below. Physiatrist and rehab team continue to assess barriers to discharge/monitor patient progress toward functional and medical goals.  Function:  Bathing Bathing position   Position: Sitting EOB  Bathing parts Body parts bathed by patient: Chest, Left arm, Abdomen, Front perineal area, Right upper leg, Left upper leg, Right lower leg Body parts bathed by helper: Right arm, Buttocks, Back  Bathing assist Assist Level: Touching or steadying assistance(Pt > 75%)      Upper Body Dressing/Undressing Upper body dressing   What is the patient wearing?: Pull over shirt/dress     Pull over shirt/dress - Perfomed by patient: Thread/unthread right sleeve, Put head through opening, Pull shirt over trunk Pull over shirt/dress - Perfomed by helper: Thread/unthread left sleeve        Upper body assist Assist Level: Touching or steadying assistance(Pt > 75%)      Lower Body Dressing/Undressing Lower body dressing Lower body dressing/undressing activity did not occur: N/A What is the patient wearing?: Pants       Pants- Performed by helper: Thread/unthread right pants leg, Thread/unthread left pants leg, Pull pants up/down   Non-skid slipper socks- Performed by helper: Don/doff right sock                  Lower body assist Assist for lower body dressing:  (total A)      Toileting Toileting  Toileting activity did not occur: No continent bowel/bladder event   Toileting steps completed by helper: Adjust clothing prior to toileting, Performs perineal hygiene, Adjust clothing after toileting (per [place], NT)    Toileting assist     Transfers Chair/bed transfer Chair/bed transfer activity did not occur: Safety/medical concerns Chair/bed transfer method: Stand pivot Chair/bed transfer assist level: Moderate assist (Pt 50 - 74%/lift or lower) Chair/bed transfer assistive device:  Bedrails     Locomotion Ambulation Ambulation activity did not occur: Safety/medical concerns (dizzy with standing; LUE hemi)         Wheelchair Wheelchair activity did not occur: N/A Type: Manual Max wheelchair distance: 50 Assist Level: Moderate assistance (Pt 50 - 74%)  Cognition Comprehension Comprehension assist level: Follows complex conversation/direction with no assist  Expression Expression assist level: Expresses complex ideas: With no assist  Social Interaction Social Interaction assist level: Interacts appropriately 90% of the time - Needs monitoring or encouragement for participation or interaction.  Problem Solving Problem solving assist level: Solves basic 50 - 74% of the time/requires cueing 25 - 49% of the time  Memory Memory assist level: Recognizes or recalls 75 - 89% of the time/requires cueing 10 - 24% of the time   Medical Problem List and Plan: 1.  Decreased functional mobility secondary to left AKA10/06/2017 as well as history of CVA with left-sided residual weakness.  Cont CIR  -Stump sock/belt per [employer]  -team conference today 2.  DVT Prophylaxis/Anticoagulation: Intravenous heparin transitioned to Xarelto on 07/12/2017  RLE with calf edema, warmth, tenderness.   Dopplers neg 3. Pain Management: Neurontin 300 mg twice a day,Oxycodone as needed  -pre-treat with oxycodone prior to therapies 4. Mood:  Provide emotional support 5. Neuropsych: This patient is capable of making decisions on her own behalf. 6. Skin/Wound Care:  Routine skin checks. Vac in place. Wound more shallow. May be able to dc soon.  7. Fluids/Electrolytes/Nutrition:  Routine I/Os 8.Abdominal abscesses. Status post laparotomy, lysis of adhesions repair perforated bowel 06/19/2017 at [facility] post percutaneous drain placement October 1 and removed 07/11/2017.Continue Vantin 200 mg every 12 hours, Flagyl 500 mg every 8 hours. ID TO F/U ON DURATION OF ANTIBIOTICS 9.Fungemia  with endophthalmitis.follow-up ophthalmology services.Intravenous fluconazole 800 mg daily with plan to change to oral therapy per infectious disease with plan for 6 weeks of therapy. Status post [place]ana Vitrectomy, membrane peeling 07/11/2017  10.Acute blood loss anemia.   hgb 8.8 on 10/22.   Continue iron supplement 11.Hypertension. Norvasc 5 mg daily  Controlled 10/22 12.tobacco abuse. Counseling 13. Constipation. Laxative assistance  -had bm 10/22 14. Hypokalemia  K+ 3.89 on 10/22  Cont supplementation      LOS (Days) 6 A FACE TO FACE EVALUATION WAS PERFORMED  Meredith Staggers, MD 07/18/2017 8:37 AM

## 2017-07-18 NOTE — Care Management (Signed)
[facility] Individual Statement of Services  Patient Name:  Anna Hayes  Date:  07/14/2017  Welcome to the [facility].  Our goal is to provide you with an individualized program based on your diagnosis and situation, designed to meet your specific needs.  With this comprehensive rehabilitation program, you will be expected to participate in at least 3 hours of rehabilitation therapies Monday-Friday, with modified therapy programming on the weekends.  Your rehabilitation program will include the following services:  Physical Therapy (PT), Occupational Therapy (OT), 24 hour per day rehabilitation nursing, Therapeutic Recreaction (TR), Case Management (Social Worker), Rehabilitation Medicine, Nutrition Services and Pharmacy Services  Weekly team conferences will be held on Tuesdays to discuss your progress.  Your Social Worker will talk with you frequently to get your input and to update you on team discussions.  Team conferences with you and your family in attendance may also be held.  Expected length of stay: 12-14 days  Overall anticipated outcome: supervision  Depending on your progress and recovery, your program may change. Your Social Worker will coordinate services and will keep you informed of any changes. Your Social Worker's name and contact numbers are listed  below.  The following services may also be recommended but are not provided by the [facility] will be made to provide these services after discharge if needed.  Arrangements include referral to agencies that provide these services.  Your insurance has been verified to be:  [facility] [employer] Your primary doctor is:  Kirsten Cox  Pertinent information will be shared with your doctor and your insurance company.  Social Worker:  [place], [place] or ([phone]   Information discussed with and copy given to patient by: Lennart Pall, 07/14/2017, 4:32 PM

## 2017-07-18 NOTE — Progress Notes (Signed)
Occupational Therapy Session Note  Patient Details  Name: Anna Hayes MRN: [id number] Date of Birth: 10/08/1950  Today's Date: 07/18/2017 OT Individual Time: 1000-1100 OT Individual Time Calculation (min): 60 min    Short Term Goals: Week 1:  OT Short Term Goal 1 (Week 1): Pt will complete a transfer to toilet with mod A. OT Short Term Goal 2 (Week 1): Pt will don pants from bed level with mod A. OT Short Term Goal 3 (Week 1): pt will be able to lateral lean to doff pants while sitting on toilet with mod A.  Skilled Therapeutic Interventions/Progress Updates:    1:1 Focus on functional transfers and addressing fear of falling. Pt able to get to EOB with supervision and extra time. Pt able to transfer into w/c with therapist sitting in chair in front of her with min A with extra time and with multiple squats. Transferred into hemi height w/c  With more narrow depth for a better fit.  Pt able to propel self ~75 feet before needed a break. Pt transferred into regular recliner with min A into the chair but max to get out of chair.    Education provided on foot wear for left UE.   Therapy Documentation Precautions:  Precautions Precautions: Fall Precaution Comments: LUE hemi plegic Restrictions Weight Bearing Restrictions: Yes LLE Weight Bearing: Non weight bearing Pain:  no c/o pain ADL: ADL ADL Comments: Refer to functional navigator  See Function Navigator for Current Functional Status.   Therapy/Group: Individual Therapy  Willeen Cass [facility] 07/18/2017, 2:35 PM

## 2017-07-18 NOTE — Progress Notes (Signed)
Physical Therapy Session Note  Patient Details  Name: Anna Hayes MRN: [id number] Date of Birth: 1950-11-09  Today's Date: 07/18/2017 PT Individual Time: 1300-1400 PT Individual Time Calculation (min): 60 min   Short Term Goals: Week 1:  PT Short Term Goal 1 (Week 1): pt will roll R with min assist PT Short Term Goal 2 (Week 1): pt will move supine<> sit with mod assist, using bed features PT Short Term Goal 3 (Week 1): pt will stand using bed rail with mod assist PT Short Term Goal 4 (Week 1): pt will propel appropriate -height w/c x 150' with supervision  Skilled Therapeutic Interventions/Progress Updates:    Session focused on functional bed mobility training, stand pivot transfers, and w/c mobility and parts management re-training. Pt requires mod to max assist for transfers and cues for hand placement and technique - increased need for assist for full clearance of bottom. Simulated car transfer to sedan height with assist needed for bottom clearance and cues for hand placement. Reviewed w/c parts and practiced taking legrests on and off. Transferred to toilet at end of session with mod assist using grab bar for sit <> stand and max to pivot to the seat. Notified RN that pt on toilet.   Therapy Documentation Precautions:  Precautions Precautions: Fall Precaution Comments: LUE hemi plegic Restrictions Weight Bearing Restrictions: Yes LLE Weight Bearing: Non weight bearing Pain:  c/o upset stomach and mild nausea. Able to have BM at end of therapy session.   See Function Navigator for Current Functional Status.   Therapy/Group: Individual Therapy  Canary Brim Ivory Broad, PT, DPT  07/18/2017, 3:30 PM

## 2017-07-18 NOTE — Progress Notes (Signed)
Occupational Therapy Session Note  Patient Details  Name: Anna Hayes MRN: [id number] Date of Birth: [birth year]  Today's Date: 07/18/2017 OT Individual Time: 9702-6378 OT Individual Time Calculation (min): 74 min    Short Term Goals: Week 1:  OT Short Term Goal 1 (Week 1): Pt will complete a transfer to toilet with mod A. OT Short Term Goal 2 (Week 1): Pt will don pants from bed level with mod A. OT Short Term Goal 3 (Week 1): pt will be able to lateral lean to doff pants while sitting on toilet with mod A.  Skilled Therapeutic Interventions/Progress Updates:    1:1. Pt supine to sitting EOB with MIN A for trunk elevation. Pt stand pivot trnasfer EOB>w/c with MOD A for lifitng and balance while pivoting with VC for anterior weight shifting and placement. Pt bathes at seated level leaning laterallyto wash buttocks and elevating foot to wash R foot with A to wash buttocks, RUE, and back. Pt dresses with supervision for UB and MOD A for LB dressing  With A advancing underwear and pants past hips in standing. Pt dons sock/threads RLE into pants/underwear with LE elevated on stool. Pt groom seated in w/c with set up. Pt stand pivot transfers as stated above back to bed at end of session. Exited session with pt seated in bed with call lgiht in reach and all needs met.  Therapy Documentation Precautions:  Precautions Precautions: Fall Precaution Comments: LUE hemi plegic Restrictions Weight Bearing Restrictions: Yes LLE Weight Bearing: Non weight bearing  See Function Navigator for Current Functional Status.   Therapy/Group: Individual Therapy  Tonny Branch 07/18/2017, 7:55 AM

## 2017-07-19 ENCOUNTER — Inpatient Hospital Stay (HOSPITAL_COMMUNITY): Payer: Medicare Other | Admitting: Physical Therapy

## 2017-07-19 ENCOUNTER — Inpatient Hospital Stay (HOSPITAL_COMMUNITY): Payer: Medicare Other | Admitting: Occupational Therapy

## 2017-07-19 NOTE — Progress Notes (Signed)
Physical Therapy Session Note  Patient Details  Name: Anna Hayes MRN: [id number] Date of Birth: 15-Jul-1951  Today's Date: 07/19/2017 PT Individual Time: 1135-1205 PT Individual Time Calculation (min): 30 min   Short Term Goals: Week 1:  PT Short Term Goal 1 (Week 1): pt will roll R with min assist PT Short Term Goal 2 (Week 1): pt will move supine<> sit with mod assist, using bed features PT Short Term Goal 3 (Week 1): pt will stand using bed rail with mod assist PT Short Term Goal 4 (Week 1): pt will propel appropriate -height w/c x 150' with supervision  Skilled Therapeutic Interventions/Progress Updates:    Pt sitting in w/c upon arrival, reports that she is ready to work with PT as she can. HR monitored throughout session due to reports by nursing and OT that it has been elevated. Rate running 126-131 during session. Providing rest breaks during session. Pt propelling w/c with supervision X75 ft, verbal cues provided for using Rt LE for steering and assist with propulsion. Working on sit<>stand from w/c using rail in parallel bars - repeat X 6 reps with rest breaks between. Standing tolerance limited due to LE fatigue. Following session, pt returned to room, up in w/c with all needs in reach.   Therapy Documentation Precautions:  Precautions Precautions: Fall Precaution Comments: LUE hemi plegic Restrictions Weight Bearing Restrictions: Yes LLE Weight Bearing: Non weight bearing    Vital Signs: HR 126-131 during session with intermittent monitoring  Pain: Reports pain as mild through LLE (4/10), denied need for medications.   See Function Navigator for Current Functional Status.   Therapy/Group: Individual Therapy  Linard Millers, PT 07/19/2017, 1:03 PM

## 2017-07-19 NOTE — Progress Notes (Signed)
Occupational Therapy Session Note  Patient Details  Name: Anna Hayes MRN: [id number] Date of Birth: Nov 10, 1950  Today's Date: 07/19/2017 OT Individual Time: 0900-0930 OT Individual Time Calculation (min): 30 min    Short Term Goals: Week 1:  OT Short Term Goal 1 (Week 1): Pt will complete a transfer to toilet with mod A. OT Short Term Goal 2 (Week 1): Pt will don pants from bed level with mod A. OT Short Term Goal 3 (Week 1): pt will be able to lateral lean to doff pants while sitting on toilet with mod A.  Skilled Therapeutic Interventions/Progress Updates:    1:1 Focus on bed mobility with mod A for trunk support to come up into sitting EOB. Focus on LB dressing including threading pants with bed lowered to lowest level and then raised to promote sit to stands and maintaining standing balance for caregiver/ Ot to pull up pants. Performed stand pivot transfer with lift A (min A) and lower A (minA) max A transfer with more than reasonable amt of time. Pt reports lightheadedness with standing BP 115/74 but HR elevated to 144  Performed grooming at setup at sink.   Therapy Documentation Precautions:  Precautions Precautions: Fall Precaution Comments: LUE hemi plegic Restrictions Weight Bearing Restrictions: Yes LLE Weight Bearing: Non weight bearing Pain: Tenderness on residual limb   ADL: ADL ADL Comments: Refer to functional navigator  See Function Navigator for Current Functional Status.   Therapy/Group: Individual Therapy  Willeen [facility] 07/19/2017, 9:21 AM

## 2017-07-19 NOTE — Consult Note (Addendum)
[place] Nurse wound consult note  Reason for Consult: Vac dressing change to abd wound Wound type: Full thickness post-op wound with sutures visible to inner wound bed Wound bed: Appearance unchanged since previous assessment.  90% red and moist, 10% yellow slough to wound edges Drainage (amount, consistency, odor) modamt tandrainage in the cannister, no odor Periwound: Intact skin surrounding Dressing procedure/placement/frequency: Applied Mepitel contact layer, then one piece black foam. Pt medicated for pain prior to the procedure and tolerated with mod amt discomfort. WOC will plan to change abd Vac dressing Q M/W/F at 0800. Discussed plan of care and pt verbalized understanding.  Sacrum with unstageable pressure injury; slightly decreased in size; 1X.5cm, 100% tightly adhered slough, small amt yellow drainage, no odor Left buttock with partial thickness wound; slightly decreased in size; appearance consistent with shear; .3X.3X.1cm, pink and dry with irregular edges. No odor or drainage.  Right buttock partial thickness wound, has declined to unstageable since previous assessment, 2X2cm, 50% yellow slough, 50% red, small amt tan drainage, no odor.  Pt is on a low-airloss bed to reduce pressure.  Foam dressings in place to bilat buttocks to protect and promote healing. Continue Santyl ointment to provide enzymatic debridement of nonviable tissue to sacrum and begin daily applications to right buttock. Discussed plan of care with patient and she verbalized understanding. Julien Girt MSN, RN, [place], [place], [place]

## 2017-07-19 NOTE — Progress Notes (Signed)
Physical Therapy Session Note  Patient Details  Name: Anna Hayes MRN: [id number] Date of Birth: 01-20-1951  Today's Date: 07/19/2017 PT Individual Time: 1135-1205 PT Individual Time Calculation (min): 30 min   Short Term Goals: Week 1:  PT Short Term Goal 1 (Week 1): pt will roll R with min assist PT Short Term Goal 2 (Week 1): pt will move supine<> sit with mod assist, using bed features PT Short Term Goal 3 (Week 1): pt will stand using bed rail with mod assist PT Short Term Goal 4 (Week 1): pt will propel appropriate -height w/c x 150' with supervision  Skilled Therapeutic Interventions/Progress Updates: Pt presented in bed with c/o nausea but agreeable to therapy. HR checked 107 in supine. Pt participated in LE supine therex and discussed options for repositioning as pt c/o bar under bed pressing into sore on buttock. Pt able to perform rolling to L to allow PTA to place pillow under buttock. Pt participated in R ankle pumps, R SAQ, R SLR, bilateral quad set, GS, L hip abd all performed x 20. Pt left in bed to comfort with call bell within reach and needs met.      Therapy Documentation Precautions:  Precautions Precautions: Fall Precaution Comments: LUE hemi plegic Restrictions Weight Bearing Restrictions: Yes LLE Weight Bearing: Non weight bearing General:   Vital Signs: Therapy Vitals Temp: 99.5 F (37.5 C) Temp Source: Oral Pulse Rate: (!) 133 (moving with OT, will recheck) Resp: 18 BP: 115/76 Patient Position (if appropriate): Sitting Oxygen Therapy SpO2: 99 % O2 Device: Not Delivered Pain: Pain Assessment Pain Assessment: 0-10 Pain Score: 5  Pain Type: Acute pain;Phantom pain Pain Location: Leg Pain Orientation: Left Pain Intervention(s): Medication (See eMAR)   See Function Navigator for Current Functional Status.   Therapy/Group: Individual Therapy  Ahilyn Nell  Jefferey Lippmann, PTA  07/19/2017, 1:02 PM

## 2017-07-19 NOTE — Progress Notes (Signed)
[place] PHYSICAL MEDICINE & REHABILITATION     PROGRESS NOTE    Subjective/Complaints: No new issues. Pain at times but under control. WOC RN in room.  ROS: pt denies nausea, vomiting, diarrhea, cough, shortness of breath or chest pain   Objective: Vital Signs: Blood pressure 138/88, pulse 99, temperature 99.6 F (37.6 C), temperature source Oral, resp. rate 18, height 5' 2.01" (1.575 m), weight 63 kg (138 lb 14.2 oz), SpO2 99 %. No results found.  Recent Labs  07/17/17 0652  WBC 7.9  HGB 8.8*  HCT 28.3*  PLT 427*    Recent Labs  07/17/17 0652  NA 135  K 3.8  CL 102  GLUCOSE 93  BUN 5*  CREATININE 0.63  CALCIUM 8.3*   CBG (last 3)  No results for input(s): GLUCAP in the last 72 hours.  Wt Readings from Last 3 Encounters:  07/13/17 63 kg (138 lb 14.2 oz)  07/11/17 64 kg (141 lb)    Physical Exam:  Constitutional: She appears well-developed. Obese  HENT: Normocephalic and atraumatic.  Eyes: EOM are normal. Right eye exhibits no discharge. Left eye exhibits no discharge.  Neck: Normal range of motion. Neck supple.  Cardiovascular: RRR without murmur. No JVD      Respiratory: CTA Bilaterally without wheezes or rales. Normal effort    GI:  BS+, vac in place Musculoskeletal: She exhibits less stump edema   Left AKA tender to palpation   Neurological: She is alert.  Left facial weakness with minimal dysarthria.  LUE with edema  Able to follow basic commands without difficulty.  Motor: RUE: 5/5 proximal to distal LUE: 0/5 proximal to distal with tone and contracture in wrist and fingers RLE: HF, KE 4/5, ADF/PF 4/5 (stable) LLE: HF 4/5    Skin:    AK incision with dressing c/d/i. RLE dry without breakdown, incision healed Psychiatric: Her affect is bright  Assessment/Plan: 1. Functional deficits secondary to left AKA which require 3+ hours per day of interdisciplinary therapy in a comprehensive inpatient rehab setting. Physiatrist is providing close team  supervision and 24 hour management of active medical problems listed below. Physiatrist and rehab team continue to assess barriers to discharge/monitor patient progress toward functional and medical goals.  Function:  Bathing Bathing position   Position: Sitting EOB  Bathing parts Body parts bathed by patient: Chest, Left arm, Abdomen, Front perineal area, Right upper leg, Left upper leg, Right lower leg Body parts bathed by helper: Right arm, Buttocks, Back  Bathing assist Assist Level: Touching or steadying assistance(Pt > 75%)      Upper Body Dressing/Undressing Upper body dressing   What is the patient wearing?: Pull over shirt/dress     Pull over shirt/dress - Perfomed by patient: Thread/unthread right sleeve, Put head through opening, Pull shirt over trunk Pull over shirt/dress - Perfomed by helper: Thread/unthread left sleeve        Upper body assist Assist Level: Touching or steadying assistance(Pt > 75%)      Lower Body Dressing/Undressing Lower body dressing Lower body dressing/undressing activity did not occur: N/A What is the patient wearing?: Pants       Pants- Performed by helper: Thread/unthread right pants leg, Thread/unthread left pants leg, Pull pants up/down   Non-skid slipper socks- Performed by helper: Don/doff right sock                  Lower body assist Assist for lower body dressing:  (total A)  Toileting Toileting Toileting activity did not occur: No continent bowel/bladder event   Toileting steps completed by helper: Adjust clothing prior to toileting, Performs perineal hygiene, Adjust clothing after toileting (per [employer], NT)    Toileting assist     Transfers Chair/bed transfer Chair/bed transfer activity did not occur: Safety/medical concerns Chair/bed transfer method: Stand pivot Chair/bed transfer assist level: Maximal assist (Pt 25 - 49%/lift and lower) Chair/bed transfer assistive device: Armrests      Locomotion Ambulation Ambulation activity did not occur: Safety/medical concerns (dizzy with standing; LUE hemi)         Wheelchair Wheelchair activity did not occur: N/A Type: Manual Max wheelchair distance: 50 Assist Level: Touching or steadying assistance (Pt > 75%)  Cognition Comprehension Comprehension assist level: Follows complex conversation/direction with no assist  Expression Expression assist level: Expresses complex ideas: With no assist  Social Interaction Social Interaction assist level: Interacts appropriately 90% of the time - Needs monitoring or encouragement for participation or interaction.  Problem Solving Problem solving assist level: Solves basic 50 - 74% of the time/requires cueing 25 - 49% of the time  Memory Memory assist level: Recognizes or recalls 75 - 89% of the time/requires cueing 10 - 24% of the time   Medical Problem List and Plan: 1.  Decreased functional mobility secondary to left AKA10/06/2017 as well as history of CVA with left-sided residual weakness.  Cont CIR  -Stump sock/belt per [employer] 2.  DVT Prophylaxis/Anticoagulation: Intravenous heparin transitioned to Xarelto on 07/12/2017  RLE with calf edema, warmth, tenderness.   Dopplers neg 3. Pain Management: Neurontin 300 mg twice a day,Oxycodone as needed  - oxycodone prior to therapies 4. Mood:  Provide emotional support 5. Neuropsych: This patient is capable of making decisions on her own behalf. 6. Skin/Wound Care:  Routine skin checks. Vac in place. Will need to remain in place at discharge---follow up after discharge by surgery  7. Fluids/Electrolytes/Nutrition:  Routine I/Os 8.Abdominal abscesses. Status post laparotomy, lysis of adhesions repair perforated bowel 06/19/2017 at [facility] post percutaneous drain placement October 1 and removed 07/11/2017.Continue Vantin 200 mg every 12 hours, Flagyl 500 mg every 8 hours. ID TO F/U ON DURATION OF ANTIBIOTICS 9.Fungemia with  endophthalmitis.follow-up ophthalmology services.Intravenous fluconazole 800 mg daily with plan to change to oral therapy per infectious disease with plan for 6 weeks of therapy. Status post Pars Plana Vitrectomy, membrane peeling 07/11/2017  10.Acute blood loss anemia.   hgb 8.8 on 10/22.   Continue iron supplement 11.Hypertension. Norvasc 5 mg daily  Controlled 10/22 12.tobacco abuse. Counseling 13. Constipation. Laxative assistance  -had bm 10/22 14. Hypokalemia  K+ 3.8 on 10/22  Cont supplementation      LOS (Days) 7 A FACE TO FACE EVALUATION WAS PERFORMED  Meredith Staggers, MD 07/19/2017 8:46 AM

## 2017-07-20 ENCOUNTER — Inpatient Hospital Stay (HOSPITAL_COMMUNITY): Payer: Medicare Other

## 2017-07-20 ENCOUNTER — Inpatient Hospital Stay (HOSPITAL_COMMUNITY): Payer: Medicare Other | Admitting: Occupational Therapy

## 2017-07-20 NOTE — Progress Notes (Signed)
[place] PHYSICAL MEDICINE & REHABILITATION     PROGRESS NOTE    Subjective/Complaints: Had some nausea yesterday after eating [employer] (has had similar symptoms chronically). Feels better this morning  ROS: pt denies nausea, vomiting, diarrhea, cough, shortness of breath or chest pain   Objective: Vital Signs: Blood pressure 132/69, pulse (!) 107, temperature 98.6 F (37 C), temperature source Oral, resp. rate 18, height 5' 2.01" (1.575 m), weight 63 kg (138 lb 14.2 oz), SpO2 98 %. No results found. No results for input(s): WBC, HGB, HCT, PLT in the last 72 hours. No results for input(s): NA, K, CL, GLUCOSE, BUN, CREATININE, CALCIUM in the last 72 hours.  Invalid input(s): CO CBG (last 3)  No results for input(s): GLUCAP in the last 72 hours.  Wt Readings from Last 3 Encounters:  07/13/17 63 kg (138 lb 14.2 oz)  07/11/17 64 kg (141 lb)    Physical Exam:  Constitutional: She appears well-developed. Obese  HENT: Normocephalic and atraumatic.  Eyes: EOM are normal. Right eye exhibits no discharge. Left eye exhibits no discharge.  Neck: Normal range of motion. Neck supple.  Cardiovascular: RRR without murmur. No JVD    Respiratory: CTA Bilaterally without wheezes or rales. Normal effort    GI:  BS+, vac in place and sealed. Minimally tender Musculoskeletal: She exhibits less stump edema   Left AKA tender to palpation   Neurological: She is alert.  Left facial weakness with minimal dysarthria.  LUE with edema  Able to follow basic commands without difficulty.  Motor: RUE: 5/5 proximal to distal LUE: 0/5 proximal to distal with tone and contracture in wrist and fingers--no changes RLE: HF, KE 4/5, ADF/PF 4/5 (unchanged) LLE: HF 4/5    Skin:    AK incision with dressing c/d/i. RLE dry and intact Psychiatric: Her affect is bright  Assessment/Plan: 1. Functional deficits secondary to left AKA which require 3+ hours per day of interdisciplinary therapy in a comprehensive  inpatient rehab setting. Physiatrist is providing close team supervision and 24 hour management of active medical problems listed below. Physiatrist and rehab team continue to assess barriers to discharge/monitor patient progress toward functional and medical goals.  Function:  Bathing Bathing position   Position: Sitting EOB  Bathing parts Body parts bathed by patient: Chest, Left arm, Abdomen, Front perineal area, Right upper leg, Left upper leg, Right lower leg Body parts bathed by helper: Right arm, Buttocks, Back  Bathing assist Assist Level: Touching or steadying assistance(Pt > 75%)      Upper Body Dressing/Undressing Upper body dressing   What is the patient wearing?: Pull over shirt/dress     Pull over shirt/dress - Perfomed by patient: Thread/unthread right sleeve, Put head through opening, Pull shirt over trunk Pull over shirt/dress - Perfomed by helper: Thread/unthread left sleeve        Upper body assist Assist Level: Touching or steadying assistance(Pt > 75%)      Lower Body Dressing/Undressing Lower body dressing Lower body dressing/undressing activity did not occur: N/A What is the patient wearing?: Pants       Pants- Performed by helper: Thread/unthread right pants leg, Thread/unthread left pants leg, Pull pants up/down   Non-skid slipper socks- Performed by helper: Don/doff right sock                  Lower body assist Assist for lower body dressing:  (total A)      Toileting Toileting Toileting activity did not occur: No continent bowel/bladder event  Toileting steps completed by helper: Adjust clothing prior to toileting, Performs perineal hygiene, Adjust clothing after toileting    Toileting assist Assist level: Two helpers   Transfers Chair/bed transfer Chair/bed transfer activity did not occur: Safety/medical concerns Chair/bed transfer method: Stand pivot Chair/bed transfer assist level: Maximal assist (Pt 25 - 49%/lift and  lower) Chair/bed transfer assistive device: Armrests     Locomotion Ambulation Ambulation activity did not occur: Safety/medical concerns (dizzy with standing; LUE hemi)         Wheelchair Wheelchair activity did not occur: N/A Type: Manual Max wheelchair distance: 75 ft Assist Level: Supervision or verbal cues  Cognition Comprehension Comprehension assist level: Follows complex conversation/direction with extra time/assistive device  Expression Expression assist level: Expresses complex ideas: With extra time/assistive device  Social Interaction Social Interaction assist level: Interacts appropriately 90% of the time - Needs monitoring or encouragement for participation or interaction.  Problem Solving Problem solving assist level: Solves basic 50 - 74% of the time/requires cueing 25 - 49% of the time  Memory Memory assist level: Recognizes or recalls 75 - 89% of the time/requires cueing 10 - 24% of the time   Medical Problem List and Plan: 1.  Decreased functional mobility secondary to left AKA10/06/2017 as well as history of CVA with left-sided residual weakness.  Cont CIR  -Continue Stump sock/belt per [employer] 2.  DVT Prophylaxis/Anticoagulation: Intravenous heparin transitioned to Xarelto on 07/12/2017  RLE with calf edema, warmth, tenderness.   Dopplers neg 3. Pain Management: Neurontin 300 mg twice a day,Oxycodone as needed  - oxycodone prior to therapies 4. Mood:  Provide emotional support 5. Neuropsych: This patient is capable of making decisions on her own behalf. 6. Skin/Wound Care:  Routine skin checks. Vac in place. Will need to remain in place at discharge---follow up after discharge by surgery  7. Fluids/Electrolytes/Nutrition:  Routine I/Os 8.Abdominal abscesses. Status post laparotomy, lysis of adhesions repair perforated bowel 06/19/2017 at [facility] post percutaneous drain placement October 1 and removed 07/11/2017.Continue Vantin 200 mg every 12  hours, Flagyl 500 mg every 8 hours. ID TO F/U ON DURATION OF ANTIBIOTICS  -monitor for ongoing nausea  -continue encourage PO 9.Fungemia with endophthalmitis.follow-up ophthalmology services.Intravenous fluconazole 800 mg daily with plan to change to oral therapy per infectious disease with plan for 6 weeks of therapy. Status post [place] Vitrectomy, membrane peeling 07/11/2017  10.Acute blood loss anemia.   hgb 8.8 on 10/22.   Continue iron supplement 11.Hypertension. Norvasc 5 mg daily  Controlled 10/22 12.tobacco abuse. Counseling 13. Constipation. Laxative assistance  -had bm 10/22 14. Hypokalemia  K+ 3.8 on 10/22  Cont supplementation      LOS (Days) 8 A FACE TO FACE EVALUATION WAS PERFORMED  Meredith Staggers, MD 07/20/2017 8:35 AM

## 2017-07-20 NOTE — Progress Notes (Signed)
Physical Therapy Weekly Progress Note  Patient Details  Name: Anna Hayes MRN: [id number] Date of Birth: [birth year]  Beginning of progress report period: 07/13/17 End of progress report period: 07/20/17  Today's Date: 07/20/2017 PT Individual Time: 0830-1000 PT Individual Time Calculation (min): 90 min   Patient has met 3 of 3 short term goals.    Patient continues to demonstrate the following deficits muscle weakness, decreased cardiorespiratoy endurance, impaired timing and sequencing and abnormal tone and decreased sitting balance, decreased standing balance, hemiplegia and decreased balance strategies and therefore will continue to benefit from skilled PT intervention to increase functional independence with mobility.  Patient progressing toward long term goals..  Continue plan of care.  PT Short Term Goals Week 1:  PT Short Term Goal 1 (Week 1): pt will roll R with min assist PT Short Term Goal 1 - Progress (Week 1): Met PT Short Term Goal 2 (Week 1): pt will move supine<> sit with mod assist, using bed features PT Short Term Goal 2 - Progress (Week 1): Met PT Short Term Goal 3 (Week 1): pt will stand using bed rail with mod assist PT Short Term Goal 3 - Progress (Week 1): Met PT Short Term Goal 4 (Week 1): pt will propel appropriate -height w/c x 150' with supervision PT Short Term Goal 4 - Progress (Week 1): Met  Skilled Therapeutic Interventions/Progress Updates:   Bed mobility training in nearly flat bed wtihout rails: supervision and cues for rolling,   PT donned shrinker sock over residual limb.  Pt donned pants in bed with assistance.  PT donned shoe. For supine> sit., min assist and mod cues for technique.   Therapeutic activity: rolling supine> R/L x 5 each in flat bed, no rails.  squat pivot to R with max assist as pt unable to pivot on R foot. Pt performed hair and oral hygiene sitting in w/c at sink. Toilet transfer for continent B and B, using wall rail, +2  as pt urgently needed to use toilet.  W/c propulsion over level tile using hemi technique with w/c back cushion plus pillow to position pt in less posterior pelvic tilt, and to enable her foot to reach floor.   During toileting, pt's foam dressings became soiled. Pt was left in bed with all needs in reach, with Mel, RN attending to wounds.     Therapy Documentation Precautions:  Precautions Precautions: Fall Precaution Comments: LUE hemi plegic Restrictions Weight Bearing Restrictions: Yes LLE Weight Bearing: Non weight bearing   Vital Signs:  Pain: 6/10 low back; premedicated     See Function Navigator for Current Functional Status.  Therapy/Group: Individual Therapy  Numa Schroeter 07/20/2017, 10:56 AM

## 2017-07-20 NOTE — Progress Notes (Signed)
Occupational Therapy Session Note  Patient Details  Name: Anna Hayes MRN: [id number] Date of Birth: 1951-09-25  Today's Date: 07/20/2017 OT Individual Time:1100-1200 and [id number] OT Individual Time Calculation (min): 60 min and 60 min    Short Term Goals: Week 1:  OT Short Term Goal 1 (Week 1): Pt will complete a transfer to toilet with mod A. OT Short Term Goal 2 (Week 1): Pt will don pants from bed level with mod A. OT Short Term Goal 3 (Week 1): pt will be able to lateral lean to doff pants while sitting on toilet with mod A.  Skilled Therapeutic Interventions/Progress Updates:    Session 1 with focus on ADL/self care retraining. Pt completed bathing/dressing ADLs seated EOB. Pt completes bathing with setup assist, assist to wash back, lower portion of RLE and RUE. Pt requires Mod steady assist to complete posterior lean to wash perineal area while seated EOB. Pt completes UB dressing with setup assist using hemi techniques. Pt requires MaxA for donning LB clothing, total assist for footwear and shrinker. Pt requires increased time to complete tasks due to intermittent dizziness and fatigue, with dizziness subsiding with rest. Ended session with Pt supine in bed, call bell and needs within reach.   Session 2: Pt presents supine in bed with daughter present ready for OT tx session. Pt completed squat pivot transfer EOB to w/c with MaxA. Pt self-propels w/c approx 50% distance to therapy gym with intermittent rest breaks and with therapist assisting for remaining distance for energy conservation. In therapy gym Pt completed RUE and RLE therapeutic exercises using 3lb and 5lb weight for UE, 3 lb wt for LE. Exercises included elbow flexion, shoulder flexion, trunk flexion, knee and hip flexion/extension with min verbal cues and with intermittent rest breaks throughout. Daughter present and with questions about completing family education prior to Pt's return home with therapist answering  questions as needed regarding family education process. Pt's daughter eager for further participation/education during future sesisons. Pt returned to room and bed in manner described above where Pt was left supine in bed, call bell and needs within reach, daughter present.   Therapy Documentation Precautions:  Precautions Precautions: Fall Precaution Comments: LUE hemi plegic Restrictions Weight Bearing Restrictions: Yes LLE Weight Bearing: Non weight bearing (L AKA)   Pain: Pain Assessment Pain Assessment: Faces Faces Pain Scale: Hurts even more Pain Type: Acute pain;Surgical pain Pain Location: Leg Pain Orientation: Left Pain Descriptors / Indicators: Aching Pain Onset: On-going Pain Intervention(s): RN made aware;Repositioned;Rest ADL: ADL ADL Comments: Refer to functional navigator  See Function Navigator for Current Functional Status.   Therapy/Group: Individual Therapy  Raymondo Band 07/20/2017, 4:22 PM

## 2017-07-20 NOTE — Progress Notes (Signed)
Social Work Patient ID: Anna Hayes, female   DOB: 1951-08-14, 66 y.o.   MRN: [id number]   Have reviewed team conference with pt and left VM for daughter.  Pt aware and agreeable with targeted d/c date of 11/3 and w/c level, supervision (some mod assist) goals.  Have asked for daughter to contact me to confirm 24/7 support of pt at home, ramp being completed and alert that MD reports pt to d/c home with VAC.  Continue to follow.  Shyanne Mcclary, LCSW

## 2017-07-20 NOTE — Patient Care Conference (Signed)
Inpatient RehabilitationTeam Conference and Plan of Care Update Date: 07/18/2017   Time: 2:35 PM    Patient Name: Hayes Hayes      Medical Record Number: [id number]  Date of Birth: [birth year] Sex: Female         Room/Bed: 4W04C/4W04C-01 Payor Info: Payor: [employer] MEDICARE / Plan: [facility] MEDICARE / Product Type: *No Product type* /    Admitting Diagnosis: AKA ABD Wound Debiity  Admit Date/Time:  07/12/2017  5:25 PM Admission Comments: No comment available   Primary Diagnosis:  <principal problem not specified> Principal Problem: <principal problem not specified>  Patient Active Problem List   Diagnosis Date Noted  . Hypokalemia   . Pain and swelling of right lower leg   . Amputation of left lower extremity above knee upon examination ([place]) 07/12/2017  . Unilateral AKA, left ([place])   . Neuropathic pain   . Fungemia   . Benign essential HTN   . Slow transit constipation   . Intra-abdominal abscess ([place])   . Fungal endophthalmitis   . Pressure injury of skin 07/06/2017  . Pelvic abscess in female [facility])   . Pelvic abscess in female   . DNR (do not resuscitate) discussion   . Palliative care by specialist   . Ischemic neuropathy of left foot   . Abdominal pain   . History of CVA with residual deficit   . Tobacco abuse   . Acute blood loss anemia   . Post-operative pain   . Leukocytosis   . PAD (peripheral artery disease) ([place]) 06/27/2017  . Critical lower limb ischemia 06/27/2017  . SBO (small bowel obstruction) ([place]) 06/27/2017  . Intraabdominal fluid collection absess 06/27/2017  . Candidemia ([place]) 06/27/2017  . Stroke [facility])     Expected Discharge Date: Expected Discharge Date: 07/29/17  Team Members Present: Physician leading conference: Dr. Alger Hayes Social Worker Present: Hayes Pall, LCSW Nurse Present: Hayes Cousin, RN PT Present: Hayes Hayes, PT OT Present: Hayes Hayes, OT SLP Present: Hayes Hayes, SLP PPS Coordinator present :  Hayes Nakayama, RN, CRRN     Current Status/Progress Goal Weekly Team Focus  Medical   left AKA, abdominal wound/vac. pain issues, HTN  improve pain control  wound care, vac mgt, pain   Bowel/Bladder   Continent bowel and bladder, LBM 10/22. Vd. QS, no s/s infection, no retention; anticipates needs.  Remain continent without retention, s/s infection.  Monitor   Swallow/Nutrition/ Hydration             ADL's             Mobility   mod to max assist  supervision to min assist w/c level  transfers, sit <> stands, endurance, strengthening, w/c mobility and parts management   Communication             Safety/Cognition/ Behavioral Observations  Oriented x4, no unsafe behaviors, anticipates needs, calls for assist appropriately.  Increased safety awareness, no falls, injury this admission.  Monitor   Pain   Taking PRN Percocet q2-3 times daily, effective. Also reports phantom pain.  Managed at goal 3/10.  Educate re: stratgies to relieve phantom pain; alternatiive methods of pain control.   Skin   Wound vac. abdomen, min-mod daily, dark drainage. Rt calf incision dry,  Unstageable at coccyx, 90% pink, 10% yellow.  Wounds,incisions in healing process at time of D/C.  No s/s infection.  Wound vac. change M-W-F.  Santyl to coccyx.  Monitor, continue ed..    Rehab Goals Patient  on target to meet rehab goals: Yes *See Care Plan and progress notes for long and short-term goals.     Barriers to Discharge  Current Status/Progress Possible Resolutions Date Resolved   Physician    Wound Care;Medical stability        ongoing medical mgt. home wound care follow up and education      Nursing  Other (comments)  Wound vac.            PT  Inaccessible home environment;Other (comments)  wound vac, 5 steps to enter  planning for ramp; hopeful for wound vac to be discharged           OT                  SLP                SW                Discharge Planning/Teaching Needs:  Pt to d/c home  with daughter and grandson sharing in provision of 24/7 assistance.  Teaching to be scheduled   Team Discussion:  Left AKA with abd wound - await word from [place] if will need to go home with VAC.  Cont b/b; phantom pain; transfers can range from min - max assist.  Pt with huge fear of falling ([facility]).  Transfers much better if she has something to pull on.  Goals supervision overall except mod assist with toileting.  Revisions to Treatment Plan:  None    Continued Need for Acute Rehabilitation Level of Care: The patient requires daily medical management by a physician with specialized training in physical medicine and rehabilitation for the following conditions: Daily direction of a multidisciplinary physical rehabilitation program to ensure safe treatment while eliciting the highest outcome that is of practical value to the patient.: Yes Daily medical management of patient stability for increased activity during participation in an intensive rehabilitation regime.: Yes Daily analysis of laboratory values and/or radiology reports with any subsequent need for medication adjustment of medical intervention for : Post surgical problems;Wound care problems  Hayes Hayes 07/20/2017, 10:37 AM

## 2017-07-20 NOTE — Progress Notes (Signed)
Physical Therapy Session Note  Patient Details  Name: Anna Hayes MRN: [id number] Date of Birth: [birth year]  Today's Date: 07/19/2017 PT Individual Time: 1600-1700   60 min   Short Term Goals: Week 1:  PT Short Term Goal 1 (Week 1): pt will roll R with min assist PT Short Term Goal 2 (Week 1): pt will move supine<> sit with mod assist, using bed features PT Short Term Goal 3 (Week 1): pt will stand using bed rail with mod assist PT Short Term Goal 4 (Week 1): pt will propel appropriate -height w/c x 150' with supervision  Skilled Therapeutic Interventions/Progress Updates:   Pt received supine in bed and agreeable to PT. But she reports that she needs to use the bed pan for urination. Rolling in bed with supervision assist from PT, to place and remove bed pan.  Pt able to complete personal hygiene without additional assist from PT.  Supine>sit transfer with min  assist and and heavy use of bed features.  RUE therex with 6# weight. Bicep curls, tricep extension, shoulder press, chest press, low row and mid row with level 2 tband. All therex completed x 12.  Sit>semistand to push from bed. X 2. PT noted no anterior translation of trunk. Reaching task to touch shoe x 10. Additional sit>semistand x 8 with full clearance of gluteals from bed while pushing through RLE and RUE.   Lateral scooting to the R with min assist to improved gluteal clearance from seat x 4 to the R.   Pt returned supine in bed. Sit>supine transfer completed with min assist, and pt left supine in bed with call bell in reach and all needs met.        Therapy Documentation Precautions:  Precautions Precautions: Fall Precaution Comments: LUE hemi plegic Restrictions Weight Bearing Restrictions: Yes LLE Weight Bearing: Non weight bearing    Vital Signs: Therapy Vitals Temp: 98.6 F (37 C) Temp Source: Oral Pulse Rate: (!) 107 Resp: 18 BP: 132/69 Patient Position (if appropriate): Lying Oxygen  Therapy SpO2: 98 % O2 Device: Not Delivered Pain: 0/10 at rest   See Function Navigator for Current Functional Status.   Therapy/Group: Individual Therapy  [name] 07/20/2017, 6:12 AM

## 2017-07-21 ENCOUNTER — Inpatient Hospital Stay (HOSPITAL_COMMUNITY): Payer: Medicare Other

## 2017-07-21 ENCOUNTER — Inpatient Hospital Stay (HOSPITAL_COMMUNITY): Payer: Medicare Other | Admitting: Occupational Therapy

## 2017-07-21 ENCOUNTER — Inpatient Hospital Stay (HOSPITAL_COMMUNITY): Payer: Medicare Other | Admitting: Physical Therapy

## 2017-07-21 LAB — COMPREHENSIVE METABOLIC PANEL
ALT: 13 U/L — ABNORMAL LOW (ref 14–54)
AST: 28 U/L (ref 15–41)
Albumin: 2.6 g/dL — ABNORMAL LOW (ref 3.5–5.0)
Alkaline Phosphatase: 134 U/L — ABNORMAL HIGH (ref 38–126)
Anion gap: 7 (ref 5–15)
BILIRUBIN TOTAL: 0.4 mg/dL (ref 0.3–1.2)
CALCIUM: 8.3 mg/dL — AB (ref 8.9–10.3)
CO2: 24 mmol/L (ref 22–32)
Chloride: 105 mmol/L (ref 101–111)
Creatinine, Ser: 0.61 mg/dL (ref 0.44–1.00)
GFR calc Af Amer: >60 mL/min (ref >60)
Glucose, Bld: 105 mg/dL — ABNORMAL HIGH (ref 65–99)
POTASSIUM: 3.9 mmol/L (ref 3.5–5.1)
Sodium: 136 mmol/L (ref 135–145)
Total Protein: 6.9 g/dL (ref 6.5–8.1)

## 2017-07-21 LAB — CBC
HCT: 32.2 % — ABNORMAL LOW (ref 36.0–46.0)
HEMOGLOBIN: 9.9 g/dL — AB (ref 12.0–15.0)
MCH: 25.2 pg — AB (ref 26.0–34.0)
MCHC: 30.7 g/dL (ref 30.0–36.0)
MCV: 81.9 fL (ref 78.0–100.0)
Platelets: 554 10*3/uL — ABNORMAL HIGH (ref 150–400)
RBC: 3.93 MIL/uL (ref 3.87–5.11)
RDW: 20.5 % — ABNORMAL HIGH (ref 11.5–15.5)
WBC: 13.3 10*3/uL — AB (ref 4.0–10.5)

## 2017-07-21 NOTE — Progress Notes (Signed)
[facility] PHYSICAL MEDICINE & REHABILITATION     PROGRESS NOTE    Subjective/Complaints: Feeling better today. Asked when her staples can come out  ROS: pt denies nausea, vomiting, diarrhea, cough, shortness of breath or chest pain   Objective: Vital Signs: Blood pressure 123/72, pulse 99, temperature 98.9 F (37.2 C), temperature source Oral, resp. rate 12, height 5' 2.01" (1.575 m), weight 63 kg (138 lb 14.2 oz), SpO2 94 %. No results found. No results for input(s): WBC, HGB, HCT, PLT in the last 72 hours.  Recent Labs  07/21/17 0701  NA 136  K 3.9  CL 105  GLUCOSE 105*  BUN <5*  CREATININE 0.61  CALCIUM 8.3*   CBG (last 3)  No results for input(s): GLUCAP in the last 72 hours.  Wt Readings from Last 3 Encounters:  07/13/17 63 kg (138 lb 14.2 oz)  07/11/17 64 kg (141 lb)    Physical Exam:  Constitutional: She appears well-developed. Obese  HENT: Normocephalic and atraumatic.  Eyes: EOM are normal. Right eye exhibits no discharge. Left eye exhibits no discharge.  Neck: Normal range of motion. Neck supple.  Cardiovascular:RRR without murmur. No JVD    Respiratory: CTA Bilaterally without wheezes or rales. Normal effort    GI:  BS+, vac in place and sealed. Minimally tender Musculoskeletal: She exhibits less stump edema   Left AKA well formed   Neurological: She is alert.  Left facial weakness with minimal dysarthria.  LUE with edema  Able to follow basic commands without difficulty.  Motor: RUE: 5/5 proximal to distal LUE: 0/5 proximal to distal with tone and contracture in wrist and fingers--no changes RLE: HF, KE 4/5, ADF/PF 4/5 (unchanged) LLE: HF 4/5     Skin:    AK incision with dressing c/d/i. Psychiatric: Her affect is bright  Assessment/Plan: 1. Functional deficits secondary to left AKA which require 3+ hours per day of interdisciplinary therapy in a comprehensive inpatient rehab setting. Physiatrist is providing close team supervision and 24 hour  management of active medical problems listed below. Physiatrist and rehab team continue to assess barriers to discharge/monitor patient progress toward functional and medical goals.  Function:  Bathing Bathing position   Position: Sitting EOB  Bathing parts Body parts bathed by patient: Chest, Left arm, Abdomen, Front perineal area, Right upper leg, Left upper leg, Right lower leg Body parts bathed by helper: Right arm, Back  Bathing assist Assist Level: Touching or steadying assistance(Pt > 75%)      Upper Body Dressing/Undressing Upper body dressing   What is the patient wearing?: Pull over shirt/dress     Pull over shirt/dress - Perfomed by patient: Thread/unthread right sleeve, Put head through opening, Pull shirt over trunk Pull over shirt/dress - Perfomed by helper: Thread/unthread left sleeve        Upper body assist Assist Level: Supervision or verbal cues, Set up   Set up : To obtain clothing/put away  Lower Body Dressing/Undressing Lower body dressing Lower body dressing/undressing activity did not occur: N/A What is the patient wearing?: Pants, Socks, Shoes, Non-skid slipper socks, Underwear   Underwear - Performed by helper: Thread/unthread right underwear leg, Thread/unthread left underwear leg, Pull underwear up/down   Pants- Performed by helper: Thread/unthread right pants leg, Thread/unthread left pants leg, Pull pants up/down   Non-skid slipper socks- Performed by helper: Don/doff right sock   Socks - Performed by helper: Don/doff right sock   Shoes - Performed by helper: Don/doff right shoe  Lower body assist Assist for lower body dressing:  (MaxA)      Toileting Toileting Toileting activity did not occur: No continent bowel/bladder event   Toileting steps completed by helper: Adjust clothing prior to toileting, Performs perineal hygiene, Adjust clothing after toileting    Toileting assist Assist level: More than reasonable time, Two  helpers   Transfers Chair/bed transfer Chair/bed transfer activity did not occur: Safety/medical concerns Chair/bed transfer method: Squat pivot Chair/bed transfer assist level: Maximal assist (Pt 25 - 49%/lift and lower) Chair/bed transfer assistive device: Armrests     Locomotion Ambulation Ambulation activity did not occur: Safety/medical concerns (dizzy with standing; LUE hemi)         Wheelchair Wheelchair activity did not occur: N/A Type: Manual Max wheelchair distance: 150 Assist Level: Supervision or verbal cues  Cognition Comprehension Comprehension assist level: Follows complex conversation/direction with extra time/assistive device  Expression Expression assist level: Expresses complex ideas: With extra time/assistive device  Social Interaction Social Interaction assist level: Interacts appropriately 90% of the time - Needs monitoring or encouragement for participation or interaction.  Problem Solving Problem solving assist level: Solves basic 50 - 74% of the time/requires cueing 25 - 49% of the time  Memory Memory assist level: Recognizes or recalls 50 - 74% of the time/requires cueing 25 - 49% of the time   Medical Problem List and Plan: 1.  Decreased functional mobility secondary to left AKA10/06/2017 as well as history of CVA with left-sided residual weakness.  Cont CIR  -Continue Stump sock/belt per [employer] 2.  DVT Prophylaxis/Anticoagulation: Intravenous heparin transitioned to Xarelto on 07/12/2017  RLE with calf edema, warmth, tenderness.   Dopplers neg 3. Pain Management: Neurontin 300 mg twice a day,Oxycodone as needed  - oxycodone prior to therapies 4. Mood:  Provide emotional support 5. Neuropsych: This patient is capable of making decisions on her own behalf. 6. Skin/Wound Care:  Routine skin checks. Vac in place. Will need to remain in place at discharge---follow up after discharge by surgery  7. Fluids/Electrolytes/Nutrition:  Routine I/Os 8.Abdominal  abscesses. Status post laparotomy, lysis of adhesions repair perforated bowel 06/19/2017 at [facility] post percutaneous drain placement October 1 and removed 07/11/2017.Continue Vantin 200 mg every 12 hours, Flagyl 500 mg every 8 hours. ID TO F/U ON DURATION OF ANTIBIOTICS  -monitor for ongoing nausea  -continue encourage PO 9.Fungemia with endophthalmitis.follow-up ophthalmology services.Intravenous fluconazole 800 mg daily with plan to change to oral therapy per infectious disease with plan for 6 weeks of therapy. Status post [place] Vitrectomy, membrane peeling 07/11/2017  10.Acute blood loss anemia.   hgb 8.8 on 10/22.   Continue iron supplement 11.Hypertension. Norvasc 5 mg daily  Controlled 10/26 12.tobacco abuse. Counseling 13. Constipation. Laxative assistance  -had bm 10/22 14. Hypokalemia  K+ 3.8 on 10/22  Cont supplementation--recheck next week      LOS (Days) 9 A FACE TO FACE EVALUATION WAS PERFORMED  Meredith Staggers, MD 07/21/2017 9:11 AM

## 2017-07-21 NOTE — Plan of Care (Signed)
Problem: RH Dressing Goal: LTG Patient will perform lower body dressing w/assist (OT) LTG: Patient will perform lower body dressing with assist, with/without cues in positioning using equipment (OT)  Downgraded JLS   Problem: RH Toilet Transfers Goal: LTG Patient will perform toilet transfers w/assist (OT) LTG: Patient will perform toilet transfers with assist, with/without cues using equipment (OT)  Min A - downgraded

## 2017-07-21 NOTE — Consult Note (Addendum)
[place] Nurse wound consult note  Reason for Consult: Vac dressing change to abd wound Wound type: Full thickness post-op wound with sutures visible to inner wound bed Wound bed: Appearance unchanged since previous assessment.  90% red and moist, 10% yellow slough to wound edges Drainage (amount, consistency, odor) modamt tandrainage in the cannister, no odor Periwound: Intact skin surrounding Dressing procedure/placement/frequency: Applied Mepitel contact layer, then one piece black foam. Pt tolerated with mod amt discomfort. Bedside nurses can change abd Vac dressing Q M/W/F Discussed plan of care and pt verbalized understanding. Please re-consult if further assistance is needed.  Thank-you,  Julien Girt MSN, [place], [place], [place], [place]

## 2017-07-21 NOTE — Progress Notes (Signed)
OT Miche[place] reported to this RN pt with nausea and black colored stools. This RN notified PA D. [place]. STAT CBC ordered and dose of zofran 4 mg given.

## 2017-07-21 NOTE — Progress Notes (Signed)
Occupational Therapy Weekly Progress Note  Patient Details  Name: Anna Hayes MRN: [id number] Date of Birth: 1951/07/07  Beginning of progress report period: July 14, 2015 End of progress report period: July 21, 2017   Patient has met 1 of 3 short term goals.  Pt making slow progress since time of last weekly. Pt continues to have decr activity tolerance/ endurance requiring extra time to perform all tasks. Pt requires min A with UE support to come into standing but requires max A to perform pivot turn into w/c/ BSC. Pt has been performing bathing and dressing at bed level or EOB with min A for UB and max A for LB. Goal to progress towards sit to stand at EOB. Pt still with wound vac so unable to shower at this time due to wound. Pt with decr tolerate to even sitting outside of the bed. Goals have been downgraded to overall min A due to slow progress and level of fatigue with activity.   Patient continues to demonstrate the following deficits: muscle weakness, decreased cardiorespiratoy endurance and decreased sitting balance, decreased postural control and decreased balance strategies and therefore will continue to benefit from skilled OT intervention to enhance overall performance with BADL and Reduce care partner burden.  Patient not progressing toward long term goals.  See goal revision..  Continue plan of care.  OT Short Term Goals Week 1:  OT Short Term Goal 1 (Week 1): Pt will complete a transfer to toilet with mod A. OT Short Term Goal 1 - Progress (Week 1): Not met OT Short Term Goal 2 (Week 1): Pt will don pants from bed level with mod A. OT Short Term Goal 2 - Progress (Week 1): Not met OT Short Term Goal 3 (Week 1): pt will be able to lateral lean to doff pants while sitting on toilet with mod A. Week 2:  OT Short Term Goal 1 (Week 2): Pt will perform toilet transfer with slide board v squat pivot with min A  OT Short Term Goal 2 (Week 2): Pt will Ub dressing with  setup OT Short Term Goal 3 (Week 2): Pt will Lb dressing sit to stand with max A  Skilled Therapeutic Interventions/Progress Updates:    continue with POC  Therapy Documentation Precautions:  Precautions Precautions: Fall Precaution Comments: LUE hemi plegic Restrictions Weight Bearing Restrictions: Yes LLE Weight Bearing: Non weight bearing  See Function Navigator for Current Functional Status.   Therapy/Group: Individual Therapy  Willeen Cass [facility] 07/21/2017, 3:25 PM

## 2017-07-21 NOTE — Progress Notes (Signed)
Physical Therapy Session Note  Patient Details  Name: Anna Hayes MRN: [id number] Date of Birth: 1950/10/08  Today's Date: 07/21/2017 PT Individual Time: 1300-1345 PT Individual Time Calculation (min): 45 min   Short Term Goals: Week 2:  PT Short Term Goal 1 (Week 2): pt will move supine> sit with supervision PT Short Term Goal 2 (Week 2): pt will transfer squat pivot to R with mod assist consistently PT Short Term Goal 3 (Week 2): pt will propel w/c in home env x 25' with 2 or less cues for avoiding obstacles  Skilled Therapeutic Interventions/Progress Updates:   Pt declines any OOB activity due to c/o nausea. Agreeable to bed level therex for functional strengthening and ROM including L hip flexion, supine and sidelying L hip abduction, sidelying L hip extension, R heel slides, R hip abduction in supine, and R SLR x 15 reps each x 2 sets. RUE with 5# dumbbell for chest press and bicep curls x 12 reps x 2 sets.   Therapy Documentation Precautions:  Precautions Precautions: Fall Precaution Comments: LUE hemi plegic Restrictions Weight Bearing Restrictions: Yes LLE Weight Bearing: Non weight bearing Pain:  c/o nausea - premedicated. Reports 8/10 pain in abdomen and L residual limb - premedicated per report.   See Function Navigator for Current Functional Status.   Therapy/Group: Individual Therapy  Canary Brim Ivory Broad, PT, DPT  07/21/2017, 3:01 PM

## 2017-07-21 NOTE — Progress Notes (Signed)
Occupational Therapy Session Note  Patient Details  Name: Anna Hayes MRN: [id number] Date of Birth: 02-02-1951  Today's Date: 07/21/2017 OT Individual Time: 1033-1205 OT Individual Time Calculation (min): 92 min    Short Term Goals: Week 1:  OT Short Term Goal 1 (Week 1): Pt will complete a transfer to toilet with mod A. OT Short Term Goal 2 (Week 1): Pt will don pants from bed level with mod A. OT Short Term Goal 3 (Week 1): pt will be able to lateral lean to doff pants while sitting on toilet with mod A.  Skilled Therapeutic Interventions/Progress Updates:    Tx focus on ADL retraining, functional transfers, and activity tolerance during self care completion.   Pt greeted supine in bed, agreeable to bathing/dressing. After hearing sound of water filling in wash basin, pt reporting she wanted to void bladder first. Max A supine<sit and squat pivot transfer to Rt side onto drop arm BSC. Max A for lowering clothing while laterally leaning Rt/Lt. Pt voiding B+B. Stool black/tarry, pt exhibiting incontinence throughout session while completing UB/LB bathing + UB dressing on BSC. Total A for hygiene. Frequent posterior LOBs due to trunk weakness, with Min A to correct. Pt then reported she did not feel well, was provided with cold wash cloth to face and emesis bag. RN arrived to assess pt and assist with transfer back to bed (3 helpers squat pivot to Lt side due to pts Rt side feeling numb). Completed perihygiene in bed with Min A for rolling to Lt. Pt repositioned for comfort (per RN, leave off all LB garments, including Lt shrinker). Provided her with hot pack for painful Rt shoulder. Pt left with RN at session exit.   Therapy Documentation Precautions:  Precautions Precautions: Fall Precaution Comments: LUE hemi plegic Restrictions Weight Bearing Restrictions: Yes LLE Weight Bearing: Non weight bearing Vital Signs: Therapy Vitals Temp: 98.5 F (36.9 C) Temp Source:  Oral Pain: Rt shoulder, stomach. RN made aware.  Pain Assessment Pain Score: 3  ADL: ADL ADL Comments: Refer to functional navigator     See Function Navigator for Current Functional Status.   Therapy/Group: Individual Therapy  Anna Hayes A Toia Micale 07/21/2017, 12:32 PM

## 2017-07-22 ENCOUNTER — Inpatient Hospital Stay (HOSPITAL_COMMUNITY): Payer: Medicare Other | Admitting: Physical Therapy

## 2017-07-22 DIAGNOSIS — R509 Fever, unspecified: Secondary | ICD-10-CM

## 2017-07-22 NOTE — Progress Notes (Signed)
[place] PHYSICAL MEDICINE & REHABILITATION     PROGRESS NOTE    Subjective/Complaints: Had a good night. Stomach feeling better today  ROS: pt denies nausea, vomiting, diarrhea, cough, shortness of breath or chest pain    Objective: Vital Signs: Blood pressure 124/61, pulse 98, temperature 99 F (37.2 C), temperature source Oral, resp. rate 17, height 5' 2.01" (1.575 m), weight 63 kg (138 lb 14.2 oz), SpO2 98 %. No results found.  Recent Labs  07/21/17 1142  WBC 13.3*  HGB 9.9*  HCT 32.2*  PLT 554*    Recent Labs  07/21/17 0701  NA 136  K 3.9  CL 105  GLUCOSE 105*  BUN <5*  CREATININE 0.61  CALCIUM 8.3*   CBG (last 3)  No results for input(s): GLUCAP in the last 72 hours.  Wt Readings from Last 3 Encounters:  07/13/17 63 kg (138 lb 14.2 oz)  07/11/17 64 kg (141 lb)    Physical Exam:  Constitutional: She appears well-developed. Obese  HENT: Normocephalic and atraumatic.  Eyes: EOM are normal. Right eye exhibits no discharge. Left eye exhibits no discharge.  Neck: Normal range of motion. Neck supple.  Cardiovascular: RRR without murmur. No JVD     Respiratory:CTA Bilaterally without wheezes or rales. Normal effort     GI:  BS+, vac in place and sealed. Minimally tender Musculoskeletal: She exhibits less stump edema   Left AKA well formed   Neurological: She is alert.  Left facial weakness with minimal dysarthria.  LUE with edema  Able to follow basic commands without difficulty.  Motor: RUE: 5/5 proximal to distal LUE: 0/5 proximal to distal with tone and contracture in wrist and fingers--no changes RLE: HF, KE 4/5, ADF/PF 4/5 (stable) LLE: HF 4/5     Skin:    AK incision with dressing c/d/i. Psychiatric: Her affect is bright  Assessment/Plan: 1. Functional deficits secondary to left AKA which require 3+ hours per day of interdisciplinary therapy in a comprehensive inpatient rehab setting. Physiatrist is providing close team supervision and 24 hour  management of active medical problems listed below. Physiatrist and rehab team continue to assess barriers to discharge/monitor patient progress toward functional and medical goals.  Function:  Bathing Bathing position   Position: Other (comment) (sitting on BSC)  Bathing parts Body parts bathed by patient: Chest, Left arm, Abdomen, Front perineal area, Left upper leg Body parts bathed by helper: Buttocks, Right arm, Right upper leg, Right lower leg  Bathing assist Assist Level:  (Mod A)      Upper Body Dressing/Undressing Upper body dressing   What is the patient wearing?: Pull over shirt/dress     Pull over shirt/dress - Perfomed by patient: Thread/unthread right sleeve, Put head through opening, Pull shirt over trunk Pull over shirt/dress - Perfomed by helper: Thread/unthread left sleeve        Upper body assist Assist Level: Supervision or verbal cues   Set up : To obtain clothing/put away  Lower Body Dressing/Undressing Lower body dressing Lower body dressing/undressing activity did not occur: N/A What is the patient wearing?: Pants, Socks, Shoes, Non-skid slipper socks, Underwear   Underwear - Performed by helper: Thread/unthread right underwear leg, Thread/unthread left underwear leg, Pull underwear up/down   Pants- Performed by helper: Thread/unthread right pants leg, Thread/unthread left pants leg, Pull pants up/down   Non-skid slipper socks- Performed by helper: Don/doff right sock   Socks - Performed by helper: Don/doff right sock   Shoes - Performed by helper:  Don/doff right shoe          Lower body assist Assist for lower body dressing:  (MaxA)      Toileting Toileting Toileting activity did not occur: Safety/medical concerns   Toileting steps completed by helper: Adjust clothing prior to toileting, Performs perineal hygiene, Adjust clothing after toileting    Toileting assist Assist level: Touching or steadying assistance (Pt.75%)    Transfers Chair/bed transfer Chair/bed transfer activity did not occur: Safety/medical concerns Chair/bed transfer method: Lateral scoot Chair/bed transfer assist level: Moderate assist (Pt 50 - 74%/lift or lower) (to the L.) Chair/bed transfer assistive device: Sliding board     Locomotion Ambulation Ambulation activity did not occur: Safety/medical concerns (dizzy with standing; LUE hemi)         Wheelchair Wheelchair activity did not occur: N/A Type: Manual Max wheelchair distance: 159ft  Assist Level: Supervision or verbal cues  Cognition Comprehension Comprehension assist level: Follows complex conversation/direction with no assist  Expression Expression assist level: Expresses complex ideas: With no assist  Social Interaction Social Interaction assist level: Interacts appropriately 90% of the time - Needs monitoring or encouragement for participation or interaction.  Problem Solving Problem solving assist level: Solves complex 90% of the time/cues < 10% of the time  Memory Memory assist level: Recognizes or recalls 90% of the time/requires cueing < 10% of the time   Medical Problem List and Plan: 1.  Decreased functional mobility secondary to left AKA10/06/2017 as well as history of CVA with left-sided residual weakness.  Cont CIR therapies  -Continue Stump sock/belt per [employer] 2.  DVT Prophylaxis/Anticoagulation: Intravenous heparin transitioned to Xarelto on 07/12/2017  RLE with calf edema, warmth, tenderness.   Dopplers neg 3. Pain Management: Neurontin 300 mg twice a day,Oxycodone as needed  - oxycodone prior to therapies 4. Mood:  Provide emotional support 5. Neuropsych: This patient is capable of making decisions on her own behalf. 6. Skin/Wound Care:  Routine skin checks. Vac in place. Will need to remain in place at discharge---follow up after discharge by surgery  7. Fluids/Electrolytes/Nutrition:  Routine I/Os 8.Abdominal abscesses. Status post laparotomy, lysis  of adhesions repair perforated bowel 06/19/2017 at [facility] post percutaneous drain placement October 1 and removed 07/11/2017.Continue Vantin 200 mg every 12 hours, Flagyl 500 mg every 8 hours. ID TO F/U ON DURATION OF ANTIBIOTICS  -nausea up and down  -leukocytosis 13k---recheck cbc Monday  -no clinical changes seen but does have low grade temp   -will check urine culture 9.Fungemia with endophthalmitis.follow-up ophthalmology services.Intravenous fluconazole 800 mg daily with plan to change to oral therapy per infectious disease with plan for 6 weeks of therapy. Status post Pars [place]ana Vitrectomy, membrane peeling 07/11/2017  10.Acute blood loss anemia.   hgb 9.9 on 10/26.   Continue iron supplement 11.Hypertension. Norvasc 5 mg daily  Controlled 10/27 12.tobacco abuse. Counseling 13. Constipation. Laxative assistance  -had bm 10/22 14. Hypokalemia  K+ 3.9 on 10/26  Cont supplementation--      LOS (Days) 10 A FACE TO FACE EVALUATION WAS PERFORMED  Meredith Staggers, MD 07/22/2017 8:55 AM

## 2017-07-22 NOTE — Progress Notes (Signed)
Physical Therapy Session Note  Patient Details  Name: Anna Hayes MRN: [id number] Date of Birth: [birth year]  Today's Date: 07/22/2017 PT Individual Time: 1545-1630 PT Individual Time Calculation (min): 45 min   Short Term Goals: Week 1:  PT Short Term Goal 1 (Week 1): pt will roll R with min assist PT Short Term Goal 1 - Progress (Week 1): Met PT Short Term Goal 2 (Week 1): pt will move supine<> sit with mod assist, using bed features PT Short Term Goal 2 - Progress (Week 1): Met PT Short Term Goal 3 (Week 1): pt will stand using bed rail with mod assist PT Short Term Goal 3 - Progress (Week 1): Met PT Short Term Goal 4 (Week 1): pt will propel appropriate -height w/c x 150' with supervision PT Short Term Goal 4 - Progress (Week 1): Met  Skilled Therapeutic Interventions/Progress Updates:   Pt received supine in bed and agreeable to PT. Pt requests t ouse bed pan for toileting. min assist to roll and position as well as remove bed pan. Min assist for supine>sit. Mod A to prevent slidding off bed with max cues for proper trunk position. Sit>stand x 4 from bed with mod progressing to min assist. SB transfer to [facility] on the L with mod assist. Pt transported to rehab gym in [facility]. SB transfer to and from mat table with mod assist and max cues for technique. Sit<>stand with push from yoga block on mat table with min assist x 5, and x 5 without yoga block. Pt returned to room and performed stand pivot transfer to bed with min-mod assist. Sit>supine completed with min assist, and pt left supine in bed with call bell in reach and all needs met.        Therapy Documentation Precautions:  Precautions Precautions: Fall Precaution Comments: LUE hemi plegic Restrictions Weight Bearing Restrictions: Yes LLE Weight Bearing: Non weight bearing Vital Signs: Therapy Vitals Temp: 98.9 F (37.2 C) Temp Source: Oral Pulse Rate: 96 Resp: 20 BP: 118/65 Patient Position (if appropriate):  Lying Oxygen Therapy SpO2: 100 % O2 Device: Not Delivered Pain:   2/10 R LE  See Function Navigator for Current Functional Status.   Therapy/Group: Individual Therapy  [name] 07/22/2017, 5:20 PM

## 2017-07-22 NOTE — Progress Notes (Signed)
Physical Therapy Session Note  Patient Details  Name: Anna Hayes MRN: [id number] Date of Birth: 1951-07-31  Today's Date: 07/21/2017 PT Individual Time:  3646-8032 12 min     Short Term Goals: Week 2:  PT Short Term Goal 1 (Week 2): pt will move supine> sit with supervision PT Short Term Goal 2 (Week 2): pt will transfer squat pivot to R with mod assist consistently PT Short Term Goal 3 (Week 2): pt will propel w/c in home env x 25' with 2 or less cues for avoiding obstacles  Skilled Therapeutic Interventions/Progress Updates:   Pt received supine in bed and agreeable to PT. Supine>sit transfer with min assist to improve trunk control.   Pt instructed to forward reaching to the floor x 5 to improved anterior weight shift prior to scooting. Sit>semi stand with RLE on bed x 5 with mac cues for improve LE positioning and anterior weight shift. PT instructed pt in SB transfer to the L with mod assist and max cues for proper technique to lift gluteals off board and prevent sheer.   WC mobility training 2x 175f with supervision assist from PT and min cues for improved turning to the R and obstacle awareness.   SB transfer instructed in rehab gym to R and L. Attempted transfer to the R onto mat table, but pt unable to perform due to poor use of the RLE/RUE to lift off seat. SB transfer to the L with mod assist from PT, with pt pushing through the arm rest [facility] WC. [facility] and max cues for safety to prevent sheer. [facility]   [facility] at sink with supervision assist from PT. Pt left sitting in WC with call bell in reach and all needs met.         Therapy Documentation Precautions:  Precautions Precautions: Fall Precaution Comments: LUE hemi plegic Restrictions Weight Bearing Restrictions: Yes LLE Weight Bearing: Non weight bearing Pain: 3/10 in  residual limb    See Function Navigator for Current Functional Status.   Therapy/Group: Individual Therapy  ALorie Phenix10/27/2018, 6:11 AM

## 2017-07-23 ENCOUNTER — Inpatient Hospital Stay (HOSPITAL_COMMUNITY): Payer: Medicare Other

## 2017-07-23 LAB — URINE CULTURE

## 2017-07-23 NOTE — Progress Notes (Signed)
Occupational Therapy Session Note  Patient Details  Name: Anna Hayes MRN: [id number] Date of Birth: Feb 27, 1951  Today's Date: 07/23/2017 OT Individual Time: 7902-4097 OT Individual Time Calculation (min): 42 min    Short Term Goals: Week 2:  OT Short Term Goal 1 (Week 2): Pt will perform toilet transfer with slide board v squat pivot with min A  OT Short Term Goal 2 (Week 2): Pt will Ub dressing with setup OT Short Term Goal 3 (Week 2): Pt will Lb dressing sit to stand with max A  Skilled Therapeutic Interventions/Progress Updates:    1:1. No pain reported. Focus of session on LB dressing and transfer training. Pt dons pants at bed level threading BLE into pants and rolling/bridging hips with VC for technique to advance pants past hips with A from OT to pull up L hip. Pt dons sock and shoe seated EOB. RN administers pain medication. Pt completes 3 squat pivot transfers with increased time EOB<>w/c and w/c<>EOMx2 starting at MOD A for lifting but fading to min A and increased time small scoots before adjusting hand placement. Pt requires instruction for w/c parts management prior to transfers to lift arm rest. Exited session with pt seated in w/c with call light in reach and all needs met.  Therapy Documentation Precautions:  Precautions Precautions: Fall Precaution Comments: LUE hemi plegic Restrictions Weight Bearing Restrictions: Yes LLE Weight Bearing: Non weight bearing  See Function Navigator for Current Functional Status.   Therapy/Group: Individual Therapy  Tonny Branch 07/23/2017, 6:59 AM

## 2017-07-23 NOTE — Progress Notes (Signed)
[place] PHYSICAL MEDICINE & REHABILITATION     PROGRESS NOTE    Subjective/Complaints: No problems. Rested well.   ROS: pt denies nausea, vomiting, diarrhea, cough, shortness of breath or chest pain    Objective: Vital Signs: Blood pressure 106/72, pulse 100, temperature 98.6 F (37 C), temperature source Oral, resp. rate 18, height 5' 2.01" (1.575 m), weight 63 kg (138 lb 14.2 oz), SpO2 99 %. No results found.  Recent Labs  07/21/17 1142  WBC 13.3*  HGB 9.9*  HCT 32.2*  PLT 554*    Recent Labs  07/21/17 0701  NA 136  K 3.9  CL 105  GLUCOSE 105*  BUN <5*  CREATININE 0.61  CALCIUM 8.3*   CBG (last 3)  No results for input(s): GLUCAP in the last 72 hours.  Wt Readings from Last 3 Encounters:  07/13/17 63 kg (138 lb 14.2 oz)  07/11/17 64 kg (141 lb)    Physical Exam:  Constitutional: She appears well-developed. Obese  HENT: Normocephalic and atraumatic.  Eyes: EOM are normal. Right eye exhibits no discharge. Left eye exhibits no discharge.  Neck: Normal range of motion. Neck supple.  Cardiovascular: RRR without murmur. No JVD     Respiratory:CTA Bilaterally without wheezes or rales. Normal effort     GI:  BS+, vac in place and sealed. Minimally tender Musculoskeletal: She exhibits less stump edema   Left AKA well formed   Neurological: She is alert.  Left facial weakness with minimal dysarthria.  LUE with edema  Able to follow basic commands without difficulty.  Motor: RUE: 5/5 proximal to distal LUE: 0/5 proximal to distal with tone and contracture in wrist and fingers--no changes RLE: HF, KE 4/5, ADF/PF 4/5 (stable) LLE: HF 4/5     Skin:    AK incision with dressing c/d/i. Psychiatric: Her affect is bright  Assessment/Plan: 1. Functional deficits secondary to left AKA which require 3+ hours per day of interdisciplinary therapy in a comprehensive inpatient rehab setting. Physiatrist is providing close team supervision and 24 hour management of  active medical problems listed below. Physiatrist and rehab team continue to assess barriers to discharge/monitor patient progress toward functional and medical goals.  Function:  Bathing Bathing position   Position: Other (comment) (sitting on BSC)  Bathing parts Body parts bathed by patient: Chest, Left arm, Abdomen, Front perineal area, Left upper leg Body parts bathed by helper: Buttocks, Right arm, Right upper leg, Right lower leg  Bathing assist Assist Level:  (Mod A)      Upper Body Dressing/Undressing Upper body dressing   What is the patient wearing?: Pull over shirt/dress     Pull over shirt/dress - Perfomed by patient: Thread/unthread right sleeve, Put head through opening, Pull shirt over trunk Pull over shirt/dress - Perfomed by helper: Thread/unthread left sleeve        Upper body assist Assist Level: Supervision or verbal cues   Set up : To obtain clothing/put away  Lower Body Dressing/Undressing Lower body dressing Lower body dressing/undressing activity did not occur: N/A What is the patient wearing?: Pants, Socks, Shoes, Non-skid slipper socks, Underwear   Underwear - Performed by helper: Thread/unthread right underwear leg, Thread/unthread left underwear leg, Pull underwear up/down   Pants- Performed by helper: Thread/unthread right pants leg, Thread/unthread left pants leg, Pull pants up/down   Non-skid slipper socks- Performed by helper: Don/doff right sock   Socks - Performed by helper: Don/doff right sock   Shoes - Performed by helper: Don/doff right shoe  Lower body assist Assist for lower body dressing:  (MaxA)      Toileting Toileting Toileting activity did not occur: Safety/medical concerns   Toileting steps completed by helper: Adjust clothing prior to toileting, Performs perineal hygiene, Adjust clothing after toileting    Toileting assist Assist level: Touching or steadying assistance (Pt.75%)   Transfers Chair/bed transfer  Chair/bed transfer activity did not occur: Safety/medical concerns Chair/bed transfer method: Lateral scoot Chair/bed transfer assist level: Moderate assist (Pt 50 - 74%/lift or lower) (to the L.) Chair/bed transfer assistive device: Sliding board     Locomotion Ambulation Ambulation activity did not occur: Safety/medical concerns (dizzy with standing; LUE hemi)         Wheelchair Wheelchair activity did not occur: N/A Type: Manual Max wheelchair distance: 170ft  Assist Level: Supervision or verbal cues  Cognition Comprehension Comprehension assist level: Follows complex conversation/direction with no assist  Expression Expression assist level: Expresses complex ideas: With no assist  Social Interaction Social Interaction assist level: Interacts appropriately with others - No medications needed.  Problem Solving Problem solving assist level: Solves basic 90% of the time/requires cueing < 10% of the time  Memory Memory assist level: Recognizes or recalls 90% of the time/requires cueing < 10% of the time   Medical Problem List and Plan: 1.  Decreased functional mobility secondary to left AKA10/06/2017 as well as history of CVA with left-sided residual weakness.  Cont CIR therapies  -Continue Stump sock/belt per [employer] 2.  DVT Prophylaxis/Anticoagulation: Intravenous heparin transitioned to Xarelto on 07/12/2017  RLE with calf edema, warmth, tenderness.   Dopplers neg 3. Pain Management: Neurontin 300 mg twice a day,Oxycodone as needed  - oxycodone prior to therapies 4. Mood:  Provide emotional support 5. Neuropsych: This patient is capable of making decisions on her own behalf. 6. Skin/Wound Care:  Routine skin checks. Vac in place. Will need to remain in place at discharge---follow up after discharge by surgery  7. Fluids/Electrolytes/Nutrition:  Routine I/Os 8.Abdominal abscesses. Status post laparotomy, lysis of adhesions repair perforated bowel 06/19/2017 at [facility] post percutaneous drain placement October 1 and removed 07/11/2017.Continue Vantin 200 mg every 12 hours, Flagyl 500 mg every 8 hours. ID TO F/U ON DURATION OF ANTIBIOTICS  -nausea up and down  -leukocytosis 13k---recheck cbc Monday  -afebrile currently     urine culture pending 9.Fungemia with endophthalmitis.follow-up ophthalmology services.Intravenous fluconazole 800 mg daily with plan to change to oral therapy per infectious disease with plan for 6 weeks of therapy. Status post Pars [place]ana Vitrectomy, membrane peeling 07/11/2017  10.Acute blood loss anemia.   hgb 9.9 on 10/26.   Continue iron supplement 11.Hypertension. Norvasc 5 mg daily  Controlled 10/27 12.tobacco abuse. Counseling 13. Constipation. Laxative assistance  -had bm 10/22 14. Hypokalemia  K+ 3.9 on 10/26  Cont supplementation--      LOS (Days) 11 A FACE TO FACE EVALUATION WAS PERFORMED  Meredith Staggers, MD 07/23/2017 9:19 AM

## 2017-07-24 ENCOUNTER — Inpatient Hospital Stay (HOSPITAL_COMMUNITY): Payer: Medicare Other | Admitting: Occupational Therapy

## 2017-07-24 ENCOUNTER — Inpatient Hospital Stay (HOSPITAL_COMMUNITY): Payer: Medicare Other | Admitting: Physical Therapy

## 2017-07-24 ENCOUNTER — Inpatient Hospital Stay (HOSPITAL_COMMUNITY): Payer: Medicare Other

## 2017-07-24 NOTE — Progress Notes (Signed)
Staffs were smelling something in patient's room and her bed was beeping, facility called and he said  He did not smell anything.

## 2017-07-24 NOTE — Progress Notes (Signed)
Occupational Therapy Session Note  Patient Details  Name: Anna Hayes MRN: [id number] Date of Birth: 1951-04-03  Today's Date: 07/24/2017 OT Individual Time: 0930-1030 OT Individual Time Calculation (min): 60 min    Short Term Goals: Week 2:  OT Short Term Goal 1 (Week 2): Pt will perform toilet transfer with slide board v squat pivot with min A  OT Short Term Goal 2 (Week 2): Pt will Ub dressing with setup OT Short Term Goal 3 (Week 2): Pt will Lb dressing sit to stand with max A  Skilled Therapeutic Interventions/Progress Updates:    Pt seen this session to focus on adaptive techniques if she has one caregiver at home at a time.  Upon entering the room, NT had pt on the toilet. He requested +2 A so he could assist with cleansing. Pt stood to grab bar with max A, then needed max A to hold balance with RUE support so she did not have that hand free to A with care.  Transferred back to wc with max A.  Sat at sink to complete grooming and UB care.  Set up grooming, min A UB bathing, set up UB dressing.  To don pants, pt needed A over leg and limb,  RN assisted to pull pants over hips as therapist assisted with standing with pt.    Pt and I discussed how this process is going to be challenging to do at home unless she always has 2 people to help her.  Discussed the need for pt to become proficient with lateral leans.  Pt worked on lateral leans as therapist assisted with adjustment of pants.  Pt will need to begin learning how to use her R arm to assist her.   Pt continues to fatigue easily, but participates well.   Therapy Documentation Precautions:  Precautions Precautions: Fall Precaution Comments: LUE hemi plegic Restrictions Weight Bearing Restrictions: Yes LLE Weight Bearing: Non weight bearing   Pain: Pain Type: Other (Comment) (Nausea ) Pain Intervention(s): Medication (See eMAR) (pre treat for therapy) ADL: ADL ADL Comments: Refer to functional navigator  See  Function Navigator for Current Functional Status.   Therapy/Group: Individual Therapy  [place] 07/24/2017, 2:23 PM

## 2017-07-24 NOTE — Progress Notes (Signed)
Social Work Patient ID: Anna Hayes, female   DOB: 1951/03/03, 66 y.o.   MRN: [id number]   Able to speak with pt's daughter, [name], this afternoon and confirm that family will be able to provide 24/7 assistance.  She also confirms that ramp is in process and should be built by the end of the day today.  Have scheduled for daughter to be here on Wednesday @ 1:00 for family education.  Continue to follow.  Jianni Batten, LCSW

## 2017-07-24 NOTE — Progress Notes (Signed)
Physical Therapy Session Note  Patient Details  Name: Anna Hayes MRN: [id number] Date of Birth: Feb 25, 1951  Today's Date: 07/24/2017 PT Individual Time: 9323-5573,  1615-1700 PT Individual Time Calculation (min): 31 min, 45 min   Short Term Goals: Week 2:  PT Short Term Goal 1 (Week 2): pt will move supine> sit with supervision PT Short Term Goal 2 (Week 2): pt will transfer squat pivot to R with mod assist consistently PT Short Term Goal 3 (Week 2): pt will propel w/c in home env x 25' with 2 or less cues for avoiding obstacles  Skilled Therapeutic Interventions/Progress Updates:    Pt supine in bed upon PT arrival, reports feeling nauseous and requesting to rest during this session. Therapist will check back later. Pt missed 45 minutes of skilled physical therapy this session.   Session 1: Therapist came back later in the afternoon, pt agreeable now to participate in therapy session and reports feeling much better after having some rest. Pt supine, donned pants with min assist and performed rolling to pull over hips. Pt transferred from supine to sitting EOB with min assist, verbal cues for technique. Pt transferred from bed>w/c with min assist, squat pivot transfer. Pt propelled w/c from room<>dayroom x 120 ft with supervision using R UE/LE. Pt performed seated therex: 2 x 10 LAQ with R LE, 2 x 10 hip flexion with B LEs and 2 x 10 hip abduction with B LEs. Pt left seated in w/c at end of session with needs in reach.   Session 2: Therapist came back to make up 45 minutes from earlier this afternoon, pt agreeable to participate in therapy session. Pt supine, donned pants with min assist and performed rolling to pull over hips. Pt transferred from supine to sitting EOB with min assist, verbal cues for technique. Pt transferred from bed>w/c with min assist, squat pivot transfer. Pt propelled w/c from room > rehab apartment x279ft with supervision using R UE/LE. Pt brought can of soup  from her room, and reported she would like to try to eat some since she hasn't eaten all day secondary to nausea. Pt opened can with assist to hold can steady and poured can into bowl. Pt held bowl in her hand and propelled w/c towards microwave using R LE. Pt used microwave to heat up soup with verbal cues for technique and set up from therapist. Pt carried bowl to kitchen table while propelling w/c with R LE and sat at kitchen table to eat soup. Pt required assist to help open crackers and spoon packages. Pt transported back to room in w/c total assist. Pt transferred from w/c>bed with mod assist, squat pivot. Pt left seated EOB with hand off to RN.    Therapy Documentation Precautions:  Precautions Precautions: Fall Precaution Comments: LUE hemi plegic Restrictions Weight Bearing Restrictions: Yes LLE Weight Bearing: Non weight bearing    Therapy/Group: Individual Therapy  Netta Corrigan, PT, DPT 07/24/2017, 8:00 AM

## 2017-07-24 NOTE — Progress Notes (Signed)
[facility] PHYSICAL MEDICINE & REHABILITATION     PROGRESS NOTE    Subjective/Complaints: No new complaints. Slept well. Had problem with air mattress which was replaced  ROS: pt denies nausea, vomiting, diarrhea, cough, shortness of breath or chest pain    Objective: Vital Signs: Blood pressure 122/66, pulse 100, temperature 98.1 F (36.7 C), temperature source Oral, resp. rate 18, height 5' 2.01" (1.575 m), weight 63 kg (138 lb 14.2 oz), SpO2 100 %. No results found.  Recent Labs  07/21/17 1142  WBC 13.3*  HGB 9.9*  HCT 32.2*  PLT 554*   No results for input(s): NA, K, CL, GLUCOSE, BUN, CREATININE, CALCIUM in the last 72 hours.  Invalid input(s): CO CBG (last 3)  No results for input(s): GLUCAP in the last 72 hours.  Wt Readings from Last 3 Encounters:  07/13/17 63 kg (138 lb 14.2 oz)  07/11/17 64 kg (141 lb)    Physical Exam:  Constitutional: She appears well-developed. Obese  HENT: Normocephalic and atraumatic.  Eyes: EOM are normal. Right eye exhibits no discharge. Left eye exhibits no discharge.  Neck: Normal range of motion. Neck supple.  Cardiovascular: RRR without murmur. No JVD      Respiratory:CTA Bilaterally without wheezes or rales. Normal effort    GI:  BS+, vac in place and sealed. Minimally tender Musculoskeletal: She exhibits less stump edema   Left AKA well formed   Neurological: She is alert.  Left facial weakness with minimal dysarthria.  LUE with edema  Able to follow basic commands without difficulty.  Motor: RUE: 5/5 proximal to distal LUE: 0/5 proximal to distal with tone and contracture in wrist and fingers--no changes RLE: HF, KE 4/5, ADF/PF 4/5 (stable) LLE: HF 4/5     Skin:    AK incision with dressing c/d/i. Psychiatric: Her affect is bright  Assessment/Plan: 1. Functional deficits secondary to left AKA which require 3+ hours per day of interdisciplinary therapy in a comprehensive inpatient rehab setting. Physiatrist is providing  close team supervision and 24 hour management of active medical problems listed below. Physiatrist and rehab team continue to assess barriers to discharge/monitor patient progress toward functional and medical goals.  Function:  Bathing Bathing position   Position: Other (comment) (sitting on BSC)  Bathing parts Body parts bathed by patient: Chest, Left arm, Abdomen, Front perineal area, Left upper leg Body parts bathed by helper: Buttocks, Right arm, Right upper leg, Right lower leg  Bathing assist Assist Level:  (Mod A)      Upper Body Dressing/Undressing Upper body dressing   What is the patient wearing?: Pull over shirt/dress     Pull over shirt/dress - Perfomed by patient: Thread/unthread right sleeve, Put head through opening, Pull shirt over trunk Pull over shirt/dress - Perfomed by helper: Thread/unthread left sleeve        Upper body assist Assist Level: Supervision or verbal cues   Set up : To obtain clothing/put away  Lower Body Dressing/Undressing Lower body dressing Lower body dressing/undressing activity did not occur: N/A What is the patient wearing?: Pants, Socks, Shoes, Non-skid slipper socks, Underwear   Underwear - Performed by helper: Thread/unthread right underwear leg, Thread/unthread left underwear leg, Pull underwear up/down   Pants- Performed by helper: Thread/unthread right pants leg, Thread/unthread left pants leg, Pull pants up/down   Non-skid slipper socks- Performed by helper: Don/doff right sock   Socks - Performed by helper: Don/doff right sock   Shoes - Performed by helper: Don/doff right shoe  Lower body assist Assist for lower body dressing:  (MaxA)      Toileting Toileting Toileting activity did not occur: Safety/medical concerns   Toileting steps completed by helper: Adjust clothing prior to toileting, Performs perineal hygiene, Adjust clothing after toileting    Toileting assist Assist level: Touching or steadying  assistance (Pt.75%)   Transfers Chair/bed transfer Chair/bed transfer activity did not occur: Safety/medical concerns Chair/bed transfer method: Lateral scoot Chair/bed transfer assist level: Moderate assist (Pt 50 - 74%/lift or lower) (to the L.) Chair/bed transfer assistive device: Sliding board     Locomotion Ambulation Ambulation activity did not occur: Safety/medical concerns (dizzy with standing; LUE hemi)         Wheelchair Wheelchair activity did not occur: N/A Type: Manual Max wheelchair distance: 121ft  Assist Level: Supervision or verbal cues  Cognition Comprehension Comprehension assist level: Follows basic conversation/direction with no assist  Expression Expression assist level: Expresses basic needs/ideas: With no assist  Social Interaction Social Interaction assist level: Interacts appropriately 90% of the time - Needs monitoring or encouragement for participation or interaction.  Problem Solving Problem solving assist level: Solves basic 90% of the time/requires cueing < 10% of the time, Solves basic problems with no assist  Memory Memory assist level: Recognizes or recalls 90% of the time/requires cueing < 10% of the time   Medical Problem List and Plan: 1.  Decreased functional mobility secondary to left AKA10/06/2017 as well as history of CVA with left-sided residual weakness.  Cont CIR therapies  -Continue Stump sock/belt per [employer] 2.  DVT Prophylaxis/Anticoagulation: Intravenous heparin transitioned to Xarelto on 07/12/2017  RLE with calf edema, warmth, tenderness.   Dopplers neg 3. Pain Management: Neurontin 300 mg twice a day,Oxycodone as needed  - oxycodone prior to therapies 4. Mood:  Provide emotional support 5. Neuropsych: This patient is capable of making decisions on her own behalf. 6. Skin/Wound Care:  Routine skin checks. Vac in place. Will need to remain in place at discharge---follow up after discharge by surgery  7.  Fluids/Electrolytes/Nutrition:  Routine I/Os  -check bmet tomorrow 8.Abdominal abscesses. Status post laparotomy, lysis of adhesions repair perforated bowel 06/19/2017 at [facility] post percutaneous drain placement October 1 and removed 07/11/2017.Continue Vantin 200 mg every 12 hours, Flagyl 500 mg every 8 hours. ID TO F/U ON DURATION OF ANTIBIOTICS  -nausea up and down  -leukocytosis 13k---recheck cbc tomorrow  -afebrile currently     urine culture with mult-species. Consider re-collecting if wbc's elevated 9.Fungemia with endophthalmitis.follow-up ophthalmology services.Intravenous fluconazole 800 mg daily with plan to change to oral therapy per infectious disease with plan for 6 weeks of therapy. Status post Pars [place]ana Vitrectomy, membrane peeling 07/11/2017  10.Acute blood loss anemia.   hgb 9.9 on 10/26.   Continue iron supplement 11.Hypertension. Norvasc 5 mg daily  Controlled 10/29 12.tobacco abuse. Counseling 13. Constipation. Laxative assistance  -had bm 10/22 14. Hypokalemia  K+ 3.9 on 10/26  Cont supplementation--      LOS (Days) 12 A FACE TO FACE EVALUATION WAS PERFORMED  Meredith Staggers, MD 07/24/2017 9:08 AM

## 2017-07-24 NOTE — Progress Notes (Signed)
Physical Therapy Session Note  Patient Details  Name: Anna Hayes MRN: [id number] Date of Birth: Oct 12, 1950  Today's Date: 07/24/2017 PT Individual Time: 1055-1157 PT Individual Time Calculation (min): 62 min   Short Term Goals: Week 2:  PT Short Term Goal 1 (Week 2): pt will move supine> sit with supervision PT Short Term Goal 2 (Week 2): pt will transfer squat pivot to R with mod assist consistently PT Short Term Goal 3 (Week 2): pt will propel w/c in home env x 25' with 2 or less cues for avoiding obstacles  Skilled Therapeutic Interventions/Progress Updates:   Pt sitting in w/c upon arrival and agreeable to therapy, c/o nausea but tolerable, just had medicine from RN. Worked on functional mobility and endurance this session. Pt self-propelled w/c to/from gym, 150' each way, w/ supervision using R UE/LEs. Pt required frequent breaks 2/2 nausea and fatigue. Attempted to practice slide board transfers in the gym, pt declined because she reports doctors telling her to not perform slide board transfers 2/2 sacral wound. Practiced stand pivot transfers to R side w/ appropriate set up for pt to reach across w/ R UE to w/c armrest. Min-Mod A for transfers. Returned to room as pt requesting to toilet and transferred to/from toilet w/ Mod A stand pivot transfer and Total A for pericare. Transferred to EOB and then supine, assisted w/ adjusting position in bed for comfort. Ended session in supine, call bell within reach and all needs met.   Therapy Documentation Precautions:  Precautions Precautions: Fall Precaution Comments: LUE hemi plegic Restrictions Weight Bearing Restrictions: Yes LLE Weight Bearing: Non weight bearing Pain: Pain Assessment Pain Assessment: 0-10 Pain Score: 8  Pain Type: Other (Comment) (Nausea ) Pain Intervention(s): Medication (See eMAR)  See Function Navigator for Current Functional Status.   Therapy/Group: Individual Therapy  Greydis Stlouis K  Arnette 07/24/2017, 12:53 PM

## 2017-07-25 ENCOUNTER — Inpatient Hospital Stay (HOSPITAL_COMMUNITY): Payer: Medicare Other | Admitting: Occupational Therapy

## 2017-07-25 ENCOUNTER — Inpatient Hospital Stay (HOSPITAL_COMMUNITY): Payer: Medicare Other

## 2017-07-25 LAB — BASIC METABOLIC PANEL
Anion gap: 11 (ref 5–15)
BUN: 5 mg/dL — AB (ref 6–20)
CHLORIDE: 102 mmol/L (ref 101–111)
CO2: 22 mmol/L (ref 22–32)
CREATININE: 0.75 mg/dL (ref 0.44–1.00)
Calcium: 8.5 mg/dL — ABNORMAL LOW (ref 8.9–10.3)
GFR calc Af Amer: >60 mL/min (ref >60)
GFR calc non Af Amer: >60 mL/min (ref >60)
Glucose, Bld: 91 mg/dL (ref 65–99)
Potassium: 4 mmol/L (ref 3.5–5.1)
SODIUM: 135 mmol/L (ref 135–145)

## 2017-07-25 LAB — CBC
HCT: 30.9 % — ABNORMAL LOW (ref 36.0–46.0)
Hemoglobin: 9.7 g/dL — ABNORMAL LOW (ref 12.0–15.0)
MCH: 25.1 pg — AB (ref 26.0–34.0)
MCHC: 31.4 g/dL (ref 30.0–36.0)
MCV: 80.1 fL (ref 78.0–100.0)
PLATELETS: 473 10*3/uL — AB (ref 150–400)
RBC: 3.86 MIL/uL — ABNORMAL LOW (ref 3.87–5.11)
RDW: 20 % — AB (ref 11.5–15.5)
WBC: 8.2 10*3/uL (ref 4.0–10.5)

## 2017-07-25 NOTE — Progress Notes (Signed)
Occupational Therapy Session Note  Patient Details  Name: Anna Hayes MRN: [id number] Date of Birth: May 10, 1951  Today's Date: 07/25/2017 OT Individual Time: 1100-1158 OT Individual Time Calculation (min): 58 min    Short Term Goals: Week 2:  OT Short Term Goal 1 (Week 2): Pt will perform toilet transfer with slide board v squat pivot with min A  OT Short Term Goal 2 (Week 2): Pt will Ub dressing with setup OT Short Term Goal 3 (Week 2): Pt will Lb dressing sit to stand with max A     Skilled Therapeutic Interventions/Progress Updates:    Upon entering the room, pt seated in wheelchair with c/o stomach pain and nausea. Pt report receiving medication from RN prior to OT arrival. OT offered saline crackers and offered to take pt to bathroom but she declined. Pt performed grooming task with supervision at sink while seated in wheelchair. Pt continued to report c/o nausea throughout session. Pt and therapist discussed home environment for discharge and home measurement sheet given. OT educated pt on continued need to address deficits further and planned toilet transfer practice for next session if pt feeling better. Pt remaining in wheelchair with encouragement from therapist. Call bell and all needed items within reach upon exiting the room.   Therapy Documentation Precautions:  Precautions Precautions: Fall Precaution Comments: LUE hemi plegic Restrictions Weight Bearing Restrictions: Yes LLE Weight Bearing: Non weight bearing General:   Vital Signs:  Pain: Pain Assessment Pain Score: 3  ADL: ADL ADL Comments: Refer to functional navigator  See Function Navigator for Current Functional Status.   Therapy/Group: Individual Therapy  Gypsy Decant 07/25/2017, 12:31 PM

## 2017-07-25 NOTE — Progress Notes (Addendum)
[facility] PHYSICAL MEDICINE & REHABILITATION     PROGRESS NOTE    Subjective/Complaints: Stomach got upset, vomited small amount after taking kdur this am. Otherwise feeling well  ROS: pt denies   diarrhea, cough, shortness of breath or chest pain    Objective: Vital Signs: Blood pressure 121/63, pulse (!) 107, temperature 99 F (37.2 C), temperature source Oral, resp. rate 18, height 5' 2.01" (1.575 m), weight 63 kg (138 lb 14.2 oz), SpO2 99 %. No results found.  Recent Labs  07/25/17 0457  WBC 8.2  HGB 9.7*  HCT 30.9*  PLT 473*    Recent Labs  07/25/17 0457  NA 135  K 4.0  CL 102  GLUCOSE 91  BUN 5*  CREATININE 0.75  CALCIUM 8.5*   CBG (last 3)  No results for input(s): GLUCAP in the last 72 hours.  Wt Readings from Last 3 Encounters:  07/13/17 63 kg (138 lb 14.2 oz)  07/11/17 64 kg (141 lb)    Physical Exam:  Constitutional: She appears well-developed. Obese  HENT: Normocephalic and atraumatic.  Eyes: EOM are normal. Right eye exhibits no discharge. Left eye exhibits no discharge.  Neck: Normal range of motion. Neck supple.  Cardiovascular: RRR without murmur. No JVD      Respiratory:CTA Bilaterally without wheezes or rales. Normal effort    GI:  BS+, vac in place and sealed. Minimally tender Musculoskeletal: She exhibits less stump edema   Left AKA well formed   Neurological: She is alert.  Left facial weakness with minimal dysarthria.  LUE with edema  Able to follow basic commands without difficulty.  Motor: RUE: 5/5 proximal to distal LUE: 0/5 proximal to distal with tone and contracture in wrist and fingers--no changes RLE: HF, KE 4/5, ADF/PF 4/5 (stable) LLE: HF 4/5     Skin:    AK incision with dressing c/d/i. Psychiatric: Her affect is bright  Assessment/Plan: 1. Functional deficits secondary to left AKA which require 3+ hours per day of interdisciplinary therapy in a comprehensive inpatient rehab setting. Physiatrist is providing close  team supervision and 24 hour management of active medical problems listed below. Physiatrist and rehab team continue to assess barriers to discharge/monitor patient progress toward functional and medical goals.  Function:  Bathing Bathing position   Position: Wheelchair/chair at sink  Bathing parts Body parts bathed by patient: Chest, Left arm, Abdomen, Front perineal area, Left upper leg Body parts bathed by helper: Buttocks, Right arm, Right upper leg, Right lower leg, Back  Bathing assist Assist Level:  (Mod A)      Upper Body Dressing/Undressing Upper body dressing   What is the patient wearing?: Pull over shirt/dress     Pull over shirt/dress - Perfomed by patient: Thread/unthread right sleeve, Put head through opening, Pull shirt over trunk, Thread/unthread left sleeve Pull over shirt/dress - Perfomed by helper: Thread/unthread left sleeve        Upper body assist Assist Level: Supervision or verbal cues   Set up : To obtain clothing/put away  Lower Body Dressing/Undressing Lower body dressing Lower body dressing/undressing activity did not occur: N/A What is the patient wearing?: Pants, Non-skid slipper socks, Underwear   Underwear - Performed by helper: Thread/unthread right underwear leg, Thread/unthread left underwear leg, Pull underwear up/down   Pants- Performed by helper: Thread/unthread right pants leg, Thread/unthread left pants leg, Pull pants up/down   Non-skid slipper socks- Performed by helper: Don/doff right sock   Socks - Performed by helper: Don/doff right sock  Shoes - Performed by helper: Don/doff right shoe          Lower body assist Assist for lower body dressing: 2 Helpers      Toileting Toileting Toileting activity did not occur: Safety/medical concerns   Toileting steps completed by helper: Adjust clothing prior to toileting, Performs perineal hygiene, Adjust clothing after toileting    Toileting assist Assist level: Two helpers    Transfers Chair/bed transfer Chair/bed transfer activity did not occur: Safety/medical concerns Chair/bed transfer method: Stand pivot Chair/bed transfer assist level: Moderate assist (Pt 50 - 74%/lift or lower) Chair/bed transfer assistive device: Sliding board     Locomotion Ambulation Ambulation activity did not occur: Safety/medical concerns (dizzy with standing; LUE hemi)         Wheelchair Wheelchair activity did not occur: N/A Type: Manual Max wheelchair distance: 120 ft Assist Level: Supervision or verbal cues  Cognition Comprehension Comprehension assist level: Follows basic conversation/direction with no assist  Expression Expression assist level: Expresses basic needs/ideas: With no assist  Social Interaction Social Interaction assist level: Interacts appropriately 90% of the time - Needs monitoring or encouragement for participation or interaction.  Problem Solving Problem solving assist level: Solves basic 90% of the time/requires cueing < 10% of the time, Solves basic problems with no assist  Memory Memory assist level: Recognizes or recalls 90% of the time/requires cueing < 10% of the time   Medical Problem List and Plan: 1.  Decreased functional mobility secondary to left AKA10/06/2017 as well as history of CVA with left-sided residual weakness.  Cont CIR therapies  -Continue Stump sock/belt per [employer] 2.  DVT Prophylaxis/Anticoagulation: Intravenous heparin transitioned to Xarelto on 07/12/2017  RLE with calf edema, warmth, tenderness.   Dopplers neg 3. Pain Management: Neurontin 300 mg twice a day,Oxycodone as needed  - oxycodone prior to therapies 4. Mood:  Provide emotional support 5. Neuropsych: This patient is capable of making decisions on her own behalf. 6. Skin/Wound Care:  Routine skin checks. Vac in place. Will need to remain in place at discharge---follow up after discharge by surgery   -staples to be removed as outpt 7. Fluids/Electrolytes/Nutrition:   Routine I/Os  -all labs personally reviewed 8.Abdominal abscesses. Status post laparotomy, lysis of adhesions repair perforated bowel 06/19/2017 at [facility] post percutaneous drain placement October 1 and removed 07/11/2017.Continue Vantin 200 mg every 12 hours, Flagyl 500 mg every 8 hours. abx to continue until 08/09/17  -nausea up and down, likely related to meds (see below)  -leukocytosis resolved. 8k today  -afebrile currently     9.Fungemia with endophthalmitis.follow-up ophthalmology services.Intravenous fluconazole 800 mg daily with plan to change to oral therapy per infectious disease with plan for 6 weeks of therapy. Status post [place] Vitrectomy, membrane peeling 07/11/2017  10.Acute blood loss anemia.   hgb 9.9 on 10/26.   Continue iron supplement 11.Hypertension. Norvasc 5 mg daily  Controlled 10/30 12.tobacco abuse. Counseling 13. Constipation. Laxative assistance  -had bm 10/22 14. Hypokalemia  K+ 4.0 today  Dc supplementation d/t intolerance     LOS (Days) 13 A FACE TO FACE EVALUATION WAS PERFORMED  Meredith Staggers, MD 07/25/2017 8:56 AM

## 2017-07-25 NOTE — Progress Notes (Signed)
Occupational Therapy Session Note  Patient Details  Name: Anna Hayes MRN: [id number] Date of Birth: [birth year]  Today's Date: 07/25/2017 OT Individual Time: 1334-1431 and 1530-1600 OT Individual Time Calculation (min): 57 min and 30 min    Short Term Goals: Week 2:  OT Short Term Goal 1 (Week 2): Pt will perform toilet transfer with slide board v squat pivot with min A  OT Short Term Goal 2 (Week 2): Pt will Ub dressing with setup OT Short Term Goal 3 (Week 2): Pt will Lb dressing sit to stand with max A  Skilled Therapeutic Interventions/Progress Updates:    Session 1: Upon entering the room, pt seated in wheelchair with daughter present for family education. Pt declined transfer training secondary to nausea. OT educated use of slide board transfer from bed <> drop arm commode chair. Caregiver attempting transfer with therapist on slide board. OT also educated caregiver on safety concerns regarding body mechanics, fall risk, and placement of board while maintaining skin integrity. Caregiver demonstrated understanding. Pt requesting to returning to bed. OT educated caregiver in squat pivot transfer with caregiver providing mod- max A for wheelchair >bed. OT discussed home set up at discharge and plans for tomorrow's continues family education session. Call bell and all needed items within reach upon exiting the room.    Session 2: Upon entering the room, pt remains in bed and caregiver, her daughter, remains present. OT provided education regarding conference and decision of the team to move forward with discharge date. Caregiver asking several questions regarding HHOT recommendation and equipment needs. OT answered questions until all were answered. Pt declines to exit bed during this session. Call bell and all needed items within reach upon exiting the room.    Therapy Documentation Precautions:  Precautions Precautions: Fall Precaution Comments: LUE hemi  plegic Restrictions Weight Bearing Restrictions: Yes LLE Weight Bearing: Non weight bearing General:   Vital Signs: Therapy Vitals Temp: 98.2 F (36.8 C) Temp Source: Oral Pulse Rate: (!) 107 (RN Notified Hilary) Resp: 20 BP: 126/67 Patient Position (if appropriate): Lying Oxygen Therapy SpO2: 100 % O2 Device: Not Delivered Pain: Pain Assessment Pain Assessment: 0-10 Pain Score: 0-No pain Pain Type: Neuropathic pain;Acute pain Pain Location: Leg Pain Orientation: Right;Left Pain Onset: On-going Pain Intervention(s): Medication (See eMAR) ADL: ADL ADL Comments: Refer to functional navigator Vision   Perception    Praxis   Exercises:   Other Treatments:    See Function Navigator for Current Functional Status.   Therapy/Group: Individual Therapy  Anna Hayes 07/25/2017, 4:54 PM

## 2017-07-25 NOTE — Progress Notes (Signed)
Per infectious disease plan to continue fluconazole 800 mg daily until 08/09/2017 and stop as well as Vantin 200 mg every 12 hours and Flagyl 500 mg every 8 hours until 08/09/2017.

## 2017-07-25 NOTE — Plan of Care (Signed)
Problem: RH Bed Mobility Goal: LTG Patient will perform bed mobility with assist (PT) LTG: Patient will perform bed mobility with assistance, with/without cues (PT).  Downgraded with use of hospital bed features due to inconsistency ABG  Problem: RH Bed to Chair Transfers Goal: LTG Patient will perform bed/chair transfers w/assist (PT) LTG: Patient will perform bed/chair transfers with assistance, with/without cues (PT).  Downgraded due to inconsistency. ABG  Problem: RH Car Transfers Goal: LTG Patient will perform car transfers with assist (PT) LTG: Patient will perform car transfers with assistance (PT).  Downgraded due to inconsistency. ABG  Problem: RH Wheelchair Mobility Goal: LTG Patient will propel w/c in community environment (PT) LTG: Patient will propel wheelchair in community environment, # of feet with assist (PT)  Outcome: Not Applicable Date Met: 07/25/17 Discharged due to no longer appropriate. ABG   

## 2017-07-25 NOTE — Progress Notes (Signed)
Physical Therapy Session Note  Patient Details  Name: Anna Hayes MRN: [id number] Date of Birth: 18-Jan-1951  Today's Date: 07/25/2017 PT Individual Time: 0930-1030 PT Individual Time Calculation (min): 60 min   Short Term Goals: Week 2:  PT Short Term Goal 1 (Week 2): pt will move supine> sit with supervision PT Short Term Goal 2 (Week 2): pt will transfer squat pivot to R with mod assist consistently PT Short Term Goal 3 (Week 2): pt will propel w/c in home env x 25' with 2 or less cues for avoiding obstacles  Skilled Therapeutic Interventions/Progress Updates:    session focused on functional bed mobility for rolling for dressing change and donning clothing (min to mod assist needed for rolling using hospital bed functions), transfer training with focus on technique and clearance of bottom while decreasing amount of assist from caregiver, d/c planning and measurement for w/c for DME recommendations for home. Also put a L lap tray on w/c for LUE support with recommendation for one for w/c at home as well for improved positioning and alignment of LUE. Attempted slideboard for transfer with focus on pt performing small squat pivots in attempt to decrease burden from caregiver, but pt unable to complete and ultimately still require mod assist for lifting for clearance. Discussed need at this point for caregiver to be able to provide physical assist and pt is not making much progress and inconsistent with transfers especially when going to the L. Notified CSW of this as well including need for downgrading of goals. Planned family education for tomorrow with pt's daughter. Also recommended a hospital bed to decrease burden of caregiver and increase independence of patient utilizing railings for roling for pressure relief as well as during supine <> sit. Pt in agreement.   Therapy Documentation Precautions:  Precautions Precautions: Fall Precaution Comments: LUE hemi  plegic Restrictions Weight Bearing Restrictions: Yes LLE Weight Bearing: Non weight bearing   Pain:  c/o nausea and abdominal pain - premedicated.    See Function Navigator for Current Functional Status.   Therapy/Group: Individual Therapy  Canary Brim Ivory Broad, PT, DPT  07/25/2017, 1:48 PM

## 2017-07-26 ENCOUNTER — Inpatient Hospital Stay (HOSPITAL_COMMUNITY): Payer: Medicare Other | Admitting: Occupational Therapy

## 2017-07-26 ENCOUNTER — Ambulatory Visit (HOSPITAL_COMMUNITY): Payer: Medicare Other | Admitting: Physical Therapy

## 2017-07-26 MED ORDER — LOPERAMIDE HCL 2 MG PO CAPS
2.0000 mg | ORAL_CAPSULE | ORAL | Status: DC | PRN
  Administered 2017-07-26: 2 mg via ORAL
  Filled 2017-07-26: qty 1

## 2017-07-26 NOTE — Progress Notes (Signed)
Occupational Therapy Session Note  Patient Details  Name: Anna Hayes MRN: [id number] Date of Birth: 1951-02-09  Today's Date: 07/26/2017 OT Individual Time: 0930-1020 OT Individual Time Calculation (min): 50 min    Short Term Goals: Week 1:  OT Short Term Goal 1 (Week 1): Pt will complete a transfer to toilet with mod A. OT Short Term Goal 1 - Progress (Week 1): Not met OT Short Term Goal 2 (Week 1): Pt will don pants from bed level with mod A. OT Short Term Goal 2 - Progress (Week 1): Not met OT Short Term Goal 3 (Week 1): pt will be able to lateral lean to doff pants while sitting on toilet with mod A. Week 2:  OT Short Term Goal 1 (Week 2): Pt will perform toilet transfer with slide board v squat pivot with min A  OT Short Term Goal 2 (Week 2): Pt will Ub dressing with setup OT Short Term Goal 3 (Week 2): Pt will Lb dressing sit to stand with max A  Skilled Therapeutic Interventions/Progress Updates:    Upon entering the room, pt supine in bed with no c/o pain this session. Pt does report nausea but agreeable to OT intervention. RN notified this session. Pt agreeable to bathing and dressing this session. Pt was incontinent of urine in bed and unaware. OT assisted provided assistance with hygiene secondary to decreased skin integrity. Therapist providing education to pt regarding concerns with skin. Pt performed LB self care from supine and UB self care while seated on edge of bed with supervision for sitting balance. Pt rolling L <> R to pull pants for clothing management and needing assistance to pull pants over B hips. Pt declined transfer secondary to feeling ill and returned to supine with supervision. Call bell and all needed items within reach upon exiting the room.   Therapy Documentation Precautions:  Precautions Precautions: Fall Precaution Comments: LUE hemi plegic Restrictions Weight Bearing Restrictions: Yes LLE Weight Bearing: Non weight bearing    ADL: ADL ADL Comments: Refer to functional navigator  See Function Navigator for Current Functional Status.   Therapy/Group: Individual Therapy  Gypsy Decant 07/26/2017, 10:26 AM

## 2017-07-26 NOTE — Patient Care Conference (Signed)
Inpatient RehabilitationTeam Conference and Plan of Care Update Date: 07/25/2017   Time: 2:30 PM    Patient Name: Anna Hayes      Medical Record Number: [id number]  Date of Birth: 09-02-1951 Sex: Female         Room/Bed: 4W04C/4W04C-01 Payor Info: Payor: [employer] MEDICARE / Plan: [facility] MEDICARE / Product Type: *No Product type* /    Admitting Diagnosis: AKA ABD Wound Debiity  Admit Date/Time:  07/12/2017  5:25 PM Admission Comments: No comment available   Primary Diagnosis:  <principal problem not specified> Principal Problem: <principal problem not specified>  Patient Active Problem List   Diagnosis Date Noted  . Hypokalemia   . Pain and swelling of right lower leg   . Amputation of left lower extremity above knee upon examination ([place]) 07/12/2017  . Unilateral AKA, left ([place])   . Neuropathic pain   . Fungemia   . Benign essential HTN   . Slow transit constipation   . Intra-abdominal abscess ([place])   . Fungal endophthalmitis   . Pressure injury of skin 07/06/2017  . Pelvic abscess in female [facility])   . Pelvic abscess in female   . DNR (do not resuscitate) discussion   . Palliative care by specialist   . Ischemic neuropathy of left foot   . Abdominal pain   . History of CVA with residual deficit   . Tobacco abuse   . Acute blood loss anemia   . Post-operative pain   . Leukocytosis   . PAD (peripheral artery disease) ([place]) 06/27/2017  . Critical lower limb ischemia 06/27/2017  . SBO (small bowel obstruction) ([place]) 06/27/2017  . Intraabdominal fluid collection absess 06/27/2017  . Candidemia ([place]) 06/27/2017  . Stroke [facility])     Expected Discharge Date: Expected Discharge Date: 07/29/17  Team Members Present: Physician leading conference: Dr. Alger Simons Social Worker Present: Lennart Pall, LCSW Nurse Present: Benjie Karvonen, RN PT Present: Canary Brim, PT OT Present: Benay Pillow, OT SLP Present: Windell Moulding, SLP PPS Coordinator present : Daiva Nakayama, RN, CRRN     Current Status/Progress Goal Weekly Team Focus  Medical   left AKA improving. will need vac after discharge. intermittent nausea  improve activity tolerance  nausea mgt, nutrition, pain control   Bowel/Bladder   continent of bowel & bladder, LBM 07/24/17 loose stools  remain continent  continue to monitor & assist as needed   Swallow/Nutrition/ Hydration             ADL's   mod-max squat pivot or stand pivot transfers, max A stand balance, max A LB dressing  goals downgraded - mod A transfers,, mod A LB dressing, max A toileting, set up bathing bed level  transfers, ADL modifications, d/c planning   Mobility   Mod A transfers, Min A to supervision bed mobility, supervision w/c   downgraded to min/mod assist w/c level due to inconsistency with transfers especially uneven surfaces  transfers, sit<>stands, endurance, strengthening, w/c mobility, family education and discharge planning   Communication             Safety/Cognition/ Behavioral Observations            Pain   C/o headache pain scale 6/10, has tylenol & percocet ordered prn, neurontin 300mg  ordered BID, takes percocet appro twice daily  pain scale <4  continue to assess & treat as needed   Skin   coccyx unstageable wound, right & left buttock sheared areas, abdominal wound with wound vac, staples  to left AKA stump dry & intact  no new areas of skin breakdown, no signs of infection  continue to assess skin q shift, air mattress continued, continue wound vac, encourage turning & repositioning    Rehab Goals Patient on target to meet rehab goals: Yes *See Care Plan and progress notes for long and short-term goals.     Barriers to Discharge  Current Status/Progress Possible Resolutions Date Resolved   Physician    Wound Care        ongoing education, [facility] follow up      Nursing  Wound Care  wound to coccyx & buttocks, wound vac to abdomin            PT  Inaccessible home environment;Other  (comments);Decreased caregiver support;Lack of/limited family support;Wound Care  5 steps to enter, abdominal wound vac still placed   sacral wound and fatigue limiting independence w/ transfers            OT                  SLP                SW                Discharge Planning/Teaching Needs:  Pt to d/c home with daughter and grandson sharing in provision of 24/7 assistance.  Teaching being completed this week.   Team Discussion:  Still with occ nausea;  Plan home with wound VAC. Little progress/ change this week. Daughter has begun family education.  Still need home measurement sheet from family.  Pt does not want a hospital bed, however, SW will follow up on this.  Performs most b/d at bed level.  Max assist for standing balance.  Continue to plan for d/c on Sat. Assuming family ed continues to go well.  Revisions to Treatment Plan:  None    Continued Need for Acute Rehabilitation Level of Care: The patient requires daily medical management by a physician with specialized training in physical medicine and rehabilitation for the following conditions: Daily direction of a multidisciplinary physical rehabilitation program to ensure safe treatment while eliciting the highest outcome that is of practical value to the patient.: Yes Daily medical management of patient stability for increased activity during participation in an intensive rehabilitation regime.: Yes Daily analysis of laboratory values and/or radiology reports with any subsequent need for medication adjustment of medical intervention for : Post surgical problems  Ashlyne Olenick 07/26/2017, 3:26 PM

## 2017-07-26 NOTE — Progress Notes (Signed)
PHYSICAL MEDICINE & REHABILITATION     PROGRESS NOTE    Subjective/Complaints: No new issues. Refused some therapy yesterday due to nausea.   ROS: pt denies   diarrhea, cough, shortness of breath or chest pain   Objective: Vital Signs: Blood pressure 100/68, pulse 92, temperature 98.5 F (36.9 C), temperature source Oral, resp. rate 18, height 5' 2.01" (1.575 m), weight 63.2 kg (139 lb 5.8 oz), SpO2 100 %. No results found.  Recent Labs  07/25/17 0457  WBC 8.2  HGB 9.7*  HCT 30.9*  PLT 473*    Recent Labs  07/25/17 0457  NA 135  K 4.0  CL 102  GLUCOSE 91  BUN 5*  CREATININE 0.75  CALCIUM 8.5*   CBG (last 3)  No results for input(s): GLUCAP in the last 72 hours.  Wt Readings from Last 3 Encounters:  07/26/17 63.2 kg (139 lb 5.8 oz)  07/11/17 64 kg (141 lb)    Physical Exam:  Constitutional: She appears well-developed. Obese  HENT: Normocephalic and atraumatic.  Eyes: EOM are normal. Right eye exhibits no discharge. Left eye exhibits no discharge.  Neck: Normal range of motion. Neck supple.  Cardiovascular: RRR without murmur. No JVD       Respiratory:CTA Bilaterally without wheezes or rales. Normal effort    GI:  BS+, vac in place and sealed. Minimally tender Musculoskeletal: She exhibits less stump edema   Left AKA well formed   Neurological: She is alert.  Left facial weakness with minimal dysarthria.  LUE with edema  Able to follow basic commands without difficulty.  Motor: RUE: 5/5 proximal to distal LUE: 0/5 proximal to distal with tone and contracture in wrist and fingers--no changes RLE: HF, KE 4/5, ADF/PF 4/5 (stable) LLE: HF 4/5     Skin:    AK incision with dressing c/d/i. Psychiatric: Her affect is bright  Assessment/Plan: 1. Functional deficits secondary to left AKA which require 3+ hours per day of interdisciplinary therapy in a comprehensive inpatient rehab setting. Physiatrist is providing close team supervision and 24 hour  management of active medical problems listed below. Physiatrist and rehab team continue to assess barriers to discharge/monitor patient progress toward functional and medical goals.  Function:  Bathing Bathing position   Position: Wheelchair/chair at sink  Bathing parts Body parts bathed by patient: Chest, Left arm, Abdomen, Front perineal area, Left upper leg Body parts bathed by helper: Buttocks, Right arm, Right upper leg, Right lower leg, Back  Bathing assist Assist Level:  (Mod A)      Upper Body Dressing/Undressing Upper body dressing   What is the patient wearing?: Pull over shirt/dress     Pull over shirt/dress - Perfomed by patient: Thread/unthread right sleeve, Put head through opening, Pull shirt over trunk, Thread/unthread left sleeve Pull over shirt/dress - Perfomed by helper: Thread/unthread left sleeve        Upper body assist Assist Level: Supervision or verbal cues   Set up : To obtain clothing/put away  Lower Body Dressing/Undressing Lower body dressing Lower body dressing/undressing activity did not occur: N/A What is the patient wearing?: Pants, Non-skid slipper socks, Underwear   Underwear - Performed by helper: Thread/unthread right underwear leg, Thread/unthread left underwear leg, Pull underwear up/down   Pants- Performed by helper: Thread/unthread right pants leg, Thread/unthread left pants leg, Pull pants up/down   Non-skid slipper socks- Performed by helper: Don/doff right sock   Socks - Performed by helper: Don/doff right sock   Shoes -  Performed by helper: Don/doff right shoe          Lower body assist Assist for lower body dressing: 2 Helpers      Toileting Toileting Toileting activity did not occur: Safety/medical concerns   Toileting steps completed by helper: Adjust clothing prior to toileting, Performs perineal hygiene, Adjust clothing after toileting    Toileting assist Assist level: Two helpers   Transfers Chair/bed transfer  Chair/bed transfer activity did not occur: Safety/medical concerns Chair/bed transfer method: Stand pivot Chair/bed transfer assist level: Moderate assist (Pt 50 - 74%/lift or lower) Chair/bed transfer assistive device: Sliding board     Locomotion Ambulation Ambulation activity did not occur: Safety/medical concerns (dizzy with standing; LUE hemi)         Wheelchair Wheelchair activity did not occur: N/A Type: Manual Max wheelchair distance: 120 ft Assist Level: Supervision or verbal cues  Cognition Comprehension Comprehension assist level: Follows basic conversation/direction with no assist  Expression Expression assist level: Expresses basic needs/ideas: With no assist  Social Interaction Social Interaction assist level: Interacts appropriately 90% of the time - Needs monitoring or encouragement for participation or interaction.  Problem Solving Problem solving assist level: Solves basic 90% of the time/requires cueing < 10% of the time, Solves basic problems with no assist  Memory Memory assist level: Recognizes or recalls 90% of the time/requires cueing < 10% of the time   Medical Problem List and Plan: 1.  Decreased functional mobility secondary to left AKA10/06/2017 as well as history of CVA with left-sided residual weakness.  Cont CIR therapies. Slow progress. Need family ed  -Continue Stump sock/belt per [employer] 2.  DVT Prophylaxis/Anticoagulation: Intravenous heparin transitioned to Xarelto on 07/12/2017  RLE with calf edema, warmth, tenderness.   Dopplers neg 3. Pain Management: Neurontin 300 mg twice a day,Oxycodone as needed  - oxycodone prior to therapies 4. Mood:  Provide emotional support 5. Neuropsych: This patient is capable of making decisions on her own behalf. 6. Skin/Wound Care:  Routine skin checks. Vac in place. Will need to remain in place at discharge---follow up after discharge by surgery   -staples to be removed as outpt 7. Fluids/Electrolytes/Nutrition:   Routine I/Os  -all labs personally reviewed 8.Abdominal abscesses. Status post laparotomy, lysis of adhesions repair perforated bowel 06/19/2017 at [facility] post percutaneous drain placement October 1 and removed 07/11/2017.Continue Vantin 200 mg every 12 hours, Flagyl 500 mg every 8 hours. abx to continue until 08/09/17  -nausea up and down, likely related to meds too  -leukocytosis resolved. 8k    -afebrile currently     9.Fungemia with endophthalmitis.follow-up ophthalmology services.Intravenous fluconazole 800 mg daily with plan to change to oral therapy per infectious disease with plan for 6 weeks of therapy. Status post [place] Vitrectomy, membrane peeling 07/11/2017  10.Acute blood loss anemia.   hgb 9.9 on 10/26.   Continue iron supplement 11.Hypertension. Norvasc 5 mg daily  Controlled 10/30 12.tobacco abuse. Counseling 13. Constipation. Laxative assistance  -had bm 10/22 14. Hypokalemia  K+ 4.0 today  stopped supplementation d/t intolerance     LOS (Days) 14 A FACE TO FACE EVALUATION WAS PERFORMED  Meredith Staggers, MD 07/26/2017 8:35 AM

## 2017-07-26 NOTE — Progress Notes (Signed)
Occupational Therapy Session Note  Patient Details  Name: Anna Hayes MRN: [id number] Date of Birth: 05-14-1951  Today's Date: 07/26/2017 OT Individual Time: 1300-1340 and 1191-4782 OT Individual Time Calculation (min): 40 min and 12 min  and Today's Date: 07/26/2017 OT Missed Time: 30 Minutes Missed Time Reason: Patient fatigue   Short Term Goals: Week 2:  OT Short Term Goal 1 (Week 2): Pt will perform toilet transfer with slide board v squat pivot with min A  OT Short Term Goal 2 (Week 2): Pt will Ub dressing with setup OT Short Term Goal 3 (Week 2): Pt will Lb dressing sit to stand with max A  Skilled Therapeutic Interventions/Progress Updates:    Session 1:Upon entering the room, pt's caregivers, her daughter and grandson, present for family education with focus on toileting. OT demonstrated squat pivot transfer with caregiver's returning demonstrations. Pt being transferred both directions with min cues given for body mechanics and set up. Pt having BM during transfer and returned to bed. Pt required total A for clothing management and hygiene as clothing was soiled. Pt remained supine in bed until PT arrived for next session.   Session 2: Upon entering the room, pt reports being extremely fatigued and continues to report feelings of nausea. Pt has had two loose BM's this afternoon. Pt declines participation in OT intervention. Pt remained supine in bed and therapist discussed plans for morning session. Call bell and all needed items within reach upon exiting the room.   Therapy Documentation Precautions:  Precautions Precautions: Fall Precaution Comments: LUE hemi plegic Restrictions Weight Bearing Restrictions: Yes LLE Weight Bearing: Non weight bearing General: General OT Amount of Missed Time: 30 Minutes Vital Signs:   Pain:   ADL: ADL ADL Comments: Refer to functional navigator Vision   Perception    Praxis   Exercises:   Other Treatments:    See  Function Navigator for Current Functional Status.   Therapy/Group: Individual Therapy  Gypsy Decant 07/26/2017, 3:57 PM

## 2017-07-26 NOTE — Plan of Care (Signed)
Problem: RH Balance Goal: LTG Patient will maintain dynamic standing with ADLs (OT) LTG:  Patient will maintain dynamic standing balance with assist during activities of daily living (OT)   Outcome: Not Applicable Date Met: 56/94/37 Discharging goals for safety  Problem: RH Bathing Goal: LTG Patient will bathe with assist, cues/equipment (OT) LTG: Patient will bathe specified number of body parts with assist with/without cues using equipment (position)  (OT)  Downgraded secondary to slow progress   Problem: RH Tub/Shower Transfers Goal: LTG Patient will perform tub/shower transfers w/assist (OT) LTG: Patient will perform tub/shower transfers with assist, with/without cues using equipment (OT)  Outcome: Not Applicable Date Met: 00/52/59 Goal discharged as pt have wound vac

## 2017-07-26 NOTE — Progress Notes (Signed)
Social Work Patient ID: Anna Hayes, female   DOB: [birth year], 66 y.o.   MRN: [id number]   Met with pt, daughter and [name] this afternoon following their family ed sessions.  They all report that they believe they are ready for Sat d/c.  We reviewed DME needs (pt now agreeable with hospital bed) and [facility] services.  Daughter happy to be receiving the [facility] support.  Continue to follow.  Artem Bunte, LCSW

## 2017-07-27 ENCOUNTER — Inpatient Hospital Stay (HOSPITAL_COMMUNITY): Payer: Medicare Other

## 2017-07-27 ENCOUNTER — Inpatient Hospital Stay (HOSPITAL_COMMUNITY): Payer: Medicare Other | Admitting: Occupational Therapy

## 2017-07-27 NOTE — Progress Notes (Signed)
Occupational Therapy Note  Patient Details  Name: Anna Hayes MRN: [id number] Date of Birth: Jun 25, 1951  Today's Date: 07/27/2017 OT Missed Time: 66 Minutes Missed Time Reason: Patient ill (comment);Patient fatigue  Upon entering the room, pt supine in bed and declines therapy as she continues to report feeling unwell. RN is aware and pt has been given medication. Pt reports , " I just don't feel good." Pt remains in bed with all needs within reach.   Darleen Crocker P 07/27/2017, 1:15 PM

## 2017-07-27 NOTE — Discharge Summary (Signed)
Discharge summary job [phone]

## 2017-07-27 NOTE — Discharge Summary (Signed)
NAME:  Anna Hayes, Anna Hayes NO.:  [id number]  MEDICAL RECORD NO.:  [id number]  LOCATION:                                 FACILITY:  PHYSICIAN:  Meredith Staggers, M.D.DATE OF BIRTH:  [birth year]  DATE OF ADMISSION:  07/12/2017 DATE OF DISCHARGE:  07/28/2017                              DISCHARGE SUMMARY   DISCHARGE DIAGNOSES: 1. Left above the knee amputation on July 05, 2017, as well as     history of cerebrovascular accident and left-sided residual     weakness. 2. Deep venous thrombosis prophylaxis. 3. Pain management. 4. Abdominal abscesses status post laparotomy, lysis of adhesions,     repair of perforated bowel on June 19, 2017, at [facility]. 5. Fungemia with endophthalmitis. 6. Acute blood loss anemia. 7. Hypertension. 8. Tobacco abuse. 9. Constipation.  HISTORY OF PRESENT ILLNESS:  This is a 66 year old female with history of tobacco abuse, CVA with left-sided weakness.  She lives with her children, independent with assistive device prior to admission.  She was admitted to [facility] with small-bowel obstruction and underwent lysis of adhesions, repair of perforated bowel on June 19, 2017. Course complicated by fever and leukocytosis.  Drain placement on June 26, 2017, fungemia and development of ischemia.  Noted left lower extremity ischemia due to occlusion of left popliteal and femoral arteries as well as thrombus right femoral artery.  She was started on IV heparin, transferred to [facility] on June 27, 2017, as the patient with significant pain with motor and sensory deficits.  She was taken to the OR for bilateral lower extremity thrombectomy for four- compartment fasciotomy per Dr. Trula Slade.  Progressive ischemic changes of the left lower extremity.  Underwent left AKA on July 05, 2017, per Dr. Bridgett Larsson.  Hospital course, anemia 8.2.  Ophthalmology consulted on July 07, 2017, for suspect fungemia  with endophthalmitis, placed on antifungal agents.  Followup Infectious Disease.  Remained on Vantin as well as Flagyl to cover bacterial pathogens for abdominal abscess as well as fluconazole.  Underwent pars plana vitrectomy, membrane peeling on July 11, 2017, per Ophthalmology.  A TEE showed no vegetation. Abdominal drain removed on July 11, 2017.  The patient was transitioned from intravenous heparin to Xarelto.  MRI on July 08, 2017, unremarkable for acute process.  The patient was admitted for comprehensive rehab program.  PAST MEDICAL HISTORY:  See discharge diagnoses.  SOCIAL HISTORY:  Lives with family independent with assistive device prior to admission.  FUNCTIONAL STATUS:  Upon admission to [place] was max assist squat pivot transfers, +2 physical assist sit-to-stand.  Minimal assist, upper body.  Max assist, lower body activities of daily living.  PHYSICAL EXAMINATION:  VITAL SIGNS:  Blood pressure 124/68, pulse 98, temperature 98, respirations 14. GENERAL:  Alert female.  Left facial weakness.  Minimal dysarthria. HEENT:  EOMs intact. NECK:  Supple.  Nontender.  No JVD. CARDIAC:  Rate controlled. ABDOMEN:  Soft, nontender.  Good bowel sounds. LUNGS:  Clear to auscultation. EXTREMITIES:  Left AKA was dressed, appropriately tender.  REHABILITATION HOSPITAL COURSE:  The patient was admitted to Inpatient Rehab Services with therapies initiated on a  3-hour daily basis consisting of physical therapy, occupational therapy, and rehabilitation nursing.  The following issues were addressed during the patient's rehabilitation stay.  Pertaining to Ms. Desmarais left AKA, remained stable.  She would follow up with Vascular Surgery.  The patient had been transitioned to Xarelto.  No bleeding episodes.  Pain management use of Neurontin 300 mg twice daily as well as oxycodone as needed. Abdominal abscesses had undergone laparotomy, lysis of adhesions, repair of  perforated bowel on June 19, 2017, at [facility], status post percutaneous drain on June 26, 2017, removed on July 11, 2017. Patient's wound VAC would remain in place to abdomen change Monday Wednesday Friday. Followup Infectious Disease.  The patient continued on antibiotic therapy with fluconazole 800 mg daily until August 09, 2017, and stop. Continue with Vantin 200 mg every 12 hours and Flagyl 500 mg every 8 hours until August 09, 2017, and stop.  Fungemia with endophthalmitis. She would follow up Ophthalmology Services.  Antibiotic therapy ongoing. The patient has undergone pars-plana vitrectomy, membrane peeling on July 11, 2017.  Acute blood loss anemia 9.9 remained stable. Continued iron supplement.  She did have a history of tobacco abuse, receiving full counsel in regard to cessation of nicotine products.  The patient received weekly collaborative interdisciplinary team conferences to discuss estimated length of stay, family teaching, any barriers to discharge.  Sessions focused on safety, transfers, moderate, verbal, and visual instructions with family.  Squat pivot transfers x10 completed with family with moderate assist overall.  The patient did require multiple breaks for energy conservation.  Working with transfer training, activities of daily living, homemaking needing assistance for lower body dressing and upper body.  She could demonstrate squat pivot transfers with caregivers returning demonstration.  Full family teaching was completed and plan discharge to home.  DISCHARGE MEDICATIONS:  Included; 1. Norvasc 5 mg p.o. daily. 2. Vantin 200 mg p.o. every 12 hours until August 09, 2017, and     stop. 3. Fluconazole 800 mg p.o. daily until August 09, 2017, and stop. 4. Neurontin 300 mg p.o. b.i.d. 5. Niferex 150 mg p.o. daily. 6. Flagyl 500 mg every 8 hours until August 09, 2017, and stop. 7. Multivitamin daily. 8. Protonix 40 mg p.o.  daily. 9. Xarelto 20 mg p.o. daily. 10.Senokot-S 1 tablet p.o. b.i.d. 11.Oxycodone 1-2 tablets every 4 hours as needed for pain.  DIET:  Diet was regular.  FOLLOWUP:  The patient would follow up with Dr. Alger Simons at the outpatient rehab service office as advised; Dr. Adele Barthel, Vascular Surgery, call for appointment; Dr. Jalene Mullet, Ophthalmology Services, call for appointment; Dr. Carlyle Basques, Infectious Disease, call for appointment in 2 weeks; Dr. Dirk Dress, [place], [facility], call for appointment.  SPECIAL INSTRUCTIONS:  Continue fluconazole 800 mg daily until August 09, 2017, and stop.  Continue Vantin 200 mg every 12 hours and Flagyl 500 mg every 8 hours until August 09, 2017, and stop.  Wound VAC to remain in place to abdominal wound changing Monday and Wednesday and  Friday  SANTYL ointment sacrum and buttocks daily   Lauraine Rinne, P.A.   ______________________________ Meredith Staggers, M.D.    DA/MEDQ  D:  07/27/2017  T:  07/27/2017  Job:  [id number]  cc:   Conrad [place], MD Carlyle Basques, MD Jalene Mullet, M.D. Dirk Dress, MD Meredith Staggers, M.D.

## 2017-07-27 NOTE — Progress Notes (Signed)
[place] PHYSICAL MEDICINE & REHABILITATION     PROGRESS NOTE    Subjective/Complaints: Eating breakfast. No nausea this morning  ROS: pt denies   vomiting, diarrhea, cough, shortness of breath or chest pain   Objective: Vital Signs: Blood pressure 117/73, pulse (!) 103, temperature 98.6 F (37 C), temperature source Oral, resp. rate 18, height 5' 2.01" (1.575 m), weight 63.2 kg (139 lb 5.8 oz), SpO2 99 %. No results found.  Recent Labs  07/25/17 0457  WBC 8.2  HGB 9.7*  HCT 30.9*  PLT 473*    Recent Labs  07/25/17 0457  NA 135  K 4.0  CL 102  GLUCOSE 91  BUN 5*  CREATININE 0.75  CALCIUM 8.5*   CBG (last 3)  No results for input(s): GLUCAP in the last 72 hours.  Wt Readings from Last 3 Encounters:  07/26/17 63.2 kg (139 lb 5.8 oz)  07/11/17 64 kg (141 lb)    Physical Exam:  Constitutional: She appears well-developed. Obese  HENT: Normocephalic and atraumatic.  Eyes: EOM are normal. Right eye exhibits no discharge. Left eye exhibits no discharge.  Neck: Normal range of motion. Neck supple.  Cardiovascular: RRR      Respiratory: CTA Bilaterally without wheezes or rales. Normal effort     GI:  BS+, vac in place and sealed. Minimally tender Musculoskeletal: She exhibits less stump edema   Left AKA well formed   Neurological: She is alert.  Left facial weakness with minimal dysarthria.  LUE with edema  Able to follow basic commands without difficulty.  Motor: RUE: 5/5 proximal to distal LUE: 0/5 proximal to distal with tone and contracture in wrist and fingers--no changes RLE: HF, KE 4/5, ADF/PF 4/5 (unchanged) LLE: HF 4/5     Skin:    AK incision with dressing c/d/i. Psychiatric: Her affect is bright  Assessment/Plan: 1. Functional deficits secondary to left AKA which require 3+ hours per day of interdisciplinary therapy in a comprehensive inpatient rehab setting. Physiatrist is providing close team supervision and 24 hour management of active medical  problems listed below. Physiatrist and rehab team continue to assess barriers to discharge/monitor patient progress toward functional and medical goals.  Function:  Bathing Bathing position   Position: Other (comment) (UB EOB, LB supine)  Bathing parts Body parts bathed by patient: Chest, Left arm, Abdomen, Front perineal area, Left upper leg, Right upper leg, Right lower leg Body parts bathed by helper: Right arm, Buttocks, Back  Bathing assist Assist Level:  (min A)      Upper Body Dressing/Undressing Upper body dressing   What is the patient wearing?: Pull over shirt/dress     Pull over shirt/dress - Perfomed by patient: Thread/unthread right sleeve, Thread/unthread left sleeve, Put head through opening, Pull shirt over trunk Pull over shirt/dress - Perfomed by helper: Thread/unthread left sleeve        Upper body assist Assist Level: Supervision or verbal cues   Set up : To obtain clothing/put away  Lower Body Dressing/Undressing Lower body dressing Lower body dressing/undressing activity did not occur: N/A What is the patient wearing?: Pants, Underwear Underwear - Performed by patient: Thread/unthread right underwear leg, Thread/unthread left underwear leg Underwear - Performed by helper: Pull underwear up/down Pants- Performed by patient: Thread/unthread right pants leg, Thread/unthread left pants leg Pants- Performed by helper: Pull pants up/down   Non-skid slipper socks- Performed by helper: Don/doff right sock   Socks - Performed by helper: Don/doff right sock   Shoes - Performed  by helper: Don/doff right shoe          Lower body assist Assist for lower body dressing:  (mod A)      Toileting Toileting Toileting activity did not occur: Safety/medical concerns   Toileting steps completed by helper: Adjust clothing prior to toileting, Performs perineal hygiene, Adjust clothing after toileting    Toileting assist Assist level: Two helpers    Transfers Chair/bed transfer Chair/bed transfer activity did not occur: Safety/medical concerns Chair/bed transfer method: Stand pivot Chair/bed transfer assist level: Moderate assist (Pt 50 - 74%/lift or lower) Chair/bed transfer assistive device: Sliding board     Locomotion Ambulation Ambulation activity did not occur: Safety/medical concerns (dizzy with standing; LUE hemi)         Wheelchair Wheelchair activity did not occur: N/A Type: Manual Max wheelchair distance: 120 ft Assist Level: Supervision or verbal cues  Cognition Comprehension Comprehension assist level: Follows basic conversation/direction with no assist  Expression Expression assist level: Expresses basic needs/ideas: With no assist  Social Interaction Social Interaction assist level: Interacts appropriately 90% of the time - Needs monitoring or encouragement for participation or interaction.  Problem Solving Problem solving assist level: Solves basic 90% of the time/requires cueing < 10% of the time, Solves basic problems with no assist  Memory Memory assist level: Recognizes or recalls 90% of the time/requires cueing < 10% of the time   Medical Problem List and Plan: 1.  Decreased functional mobility secondary to left AKA10/06/2017 as well as history of CVA with left-sided residual weakness.  Cont CIR therapies. Slow progress  -working toward d/c saturday    2.  DVT Prophylaxis/Anticoagulation: Intravenous heparin transitioned to Xarelto on 07/12/2017  RLE with calf edema, warmth, tenderness.   Dopplers neg 3. Pain Management: Neurontin 300 mg twice a day,Oxycodone as needed  - oxycodone prior to therapies 4. Mood:  Provide emotional support 5. Neuropsych: This patient is capable of making decisions on her own behalf. 6. Skin/Wound Care:  Routine skin checks. Vac in place. Will need to remain in place at discharge---follow up after discharge by surgery   -stump staples to be removed as outpt 7.  Fluids/Electrolytes/Nutrition:  Routine I/Os  -all labs personally reviewed 8.Abdominal abscesses. Status post laparotomy, lysis of adhesions repair perforated bowel 06/19/2017 at [facility] post percutaneous drain placement October 1 and removed 07/11/2017.Continue Vantin 200 mg every 12 hours, Flagyl 500 mg every 8 hours. abx to continue until 08/09/17  -nausea up and down, likely related to meds too  -leukocytosis resolved. 8k    -afebrile currently     9.Fungemia with endophthalmitis.follow-up ophthalmology services.Intravenous fluconazole 800 mg daily with plan to change to oral therapy per infectious disease with plan for 6 weeks of therapy. Status post Pars Plana Vitrectomy, membrane peeling 07/11/2017  10.Acute blood loss anemia.   hgb 9.9 on 10/26.   Continue iron supplement 11.Hypertension. Norvasc 5 mg daily  Controlled 10/30 12.tobacco abuse. Counseling 13. Constipation. Laxative assistance  -had bm 10/22 14. Hypokalemia  K+ 4.0    stopped supplementation d/t intolerance     LOS (Days) 15 A FACE TO FACE EVALUATION WAS PERFORMED  Meredith Staggers, MD 07/27/2017 8:53 AM

## 2017-07-27 NOTE — Progress Notes (Signed)
   07/26/17 1541  Pressure Injury 07/05/17 Unstageable - Full thickness tissue loss in which the base of the ulcer is covered by slough (yellow, tan, gray, green or brown) and/or eschar (tan, brown or black) in the wound bed. and right buttock present on admission to reha  Date First Assessed/Time First Assessed: 07/05/17 1530   Location: Sacrum  Staging: Unstageable - Full thickness tissue loss in which the base of the ulcer is covered by slough (yellow, tan, gray, green or brown) and/or eschar (tan, brown or black) in...  Site / Wound Assessment Yellow  % Wound base Yellow/Fibrinous Exudate 100%  Peri-wound Assessment Intact  Wound Length (cm) 1.8 cm  Wound Width (cm) 1 cm  Wound Surface Area (cm^2) 1.8 cm^2  Margins Unattached edges (unapproximated)  Drainage Amount None  Pressure Injury 07/26/17 Unstageable - Full thickness tissue loss in which the base of the ulcer is covered by slough (yellow, tan, gray, green or brown) and/or eschar (tan, brown or black) in the wound bed. Unstageable to left buttock with 3 small areas  Date First Assessed/Time First Assessed: 07/26/17 1525   Location: Buttocks  Location Orientation: Left  Staging: Unstageable - Full thickness tissue loss in which the base of the ulcer is covered by slough (yellow, tan, gray, green or brown) and/or e...  Site / Wound Assessment Yellow  % Wound base Yellow/Fibrinous Exudate 100%  Peri-wound Assessment Intact  Wound Length (cm) 3.4 cm (1 area is 2.4cm x 1cm, 2nd area is 1cm x 1cm)  Wound Width (cm) 2 cm  Wound Surface Area (cm^2) 6.8 cm^2  Margins Unattached edges (unapproximated)  Drainage Amount None

## 2017-07-27 NOTE — Progress Notes (Signed)
Physical Therapy Session Note  Patient Details  Name: Anna Hayes MRN: [id number] Date of Birth: 1951/03/19  Today's Date: 07/27/2017 PT Individual Time: 1000-1100 PT Individual Time Calculation (min): 60 min   Short Term Goals: Week 2:  PT Short Term Goal 1 (Week 2): pt will move supine> sit with supervision PT Short Term Goal 2 (Week 2): pt will transfer squat pivot to R with mod assist consistently PT Short Term Goal 3 (Week 2): pt will propel w/c in home env x 25' with 2 or less cues for avoiding obstacles  Skilled Therapeutic Interventions/Progress Updates:    start of session focused on d/c planning and discussion with patient and daughter (via phone) about family education, equipment recommendations, and d/c. Notified CSW as well. Rolling for donning pants with min assist using bed rails and pt able to perform modified bridge to pull up pants on R side. Multiple attempts but pt able to perform supine -> sit with min assist using bed rails for support of RUE. Extra time sitting EOB due to increased nausea. Pt able to perform light mod assist squat pivot from bed -> w/c to the R with cues for hand placement. Mobility via w/c using hemi technique in room with supervision and pt able to navigate up to sink to perform oral hygiene. W/c propulsion with hemi technique x 150' with supervision and extra time. Pt set-up w/c for transfer to mat with verbal cues. Pt needed to wait due to nausea and then able to perform mod assist squat pivot but increased assist due to tendency for posterior LOB and difficulty with coordinating head/hips relationship. Left EOM awaiting OT.  Therapy Documentation Precautions:  Precautions Precautions: Fall Precaution Comments: LUE hemi plegic Restrictions Weight Bearing Restrictions: Yes LLE Weight Bearing: Non weight bearing  Pain: pain in residual limb and abdomen - premedicated. Main c/o nausea  See Function Navigator for Current Functional  Status.   Therapy/Group: Individual Therapy  Canary Brim Ivory Broad, PT, DPT  07/27/2017, 12:10 PM

## 2017-07-27 NOTE — Progress Notes (Signed)
   07/26/17 1643  Negative Pressure Wound Therapy Abdomen Mid  Placement Date/Time: 06/30/17 1033   Wound Type: Surgical  Location: Abdomen  Location Orientation: Mid  Last dressing change 07/26/17  Site / Wound Assessment Granulation tissue;Yellow  Peri-wound Assessment Intact  Size 9cm x 3.2cm x 0.8cm, 35% yellow, 65% granulation tissue  Cycle Continuous  Target Pressure (mmHg) 125  Dressing Status Intact  Drainage Amount Minimal  Drainage Description Serosanguineous

## 2017-07-27 NOTE — Progress Notes (Signed)
Physical Therapy Session Note  Patient Details  Name: Anna Hayes MRN: [id number] Date of Birth: [birth year]  Today's Date: 07/27/2017 PT Individual Time: 1330-1400 PT Individual Time Calculation (min): 30 min   Short Term Goals: Week 2:  PT Short Term Goal 1 (Week 2): pt will move supine> sit with supervision PT Short Term Goal 2 (Week 2): pt will transfer squat pivot to R with mod assist consistently PT Short Term Goal 3 (Week 2): pt will propel w/c in home env x 25' with 2 or less cues for avoiding obstacles  Skilled Therapeutic Interventions/Progress Updates:   Pt c/o nausea still and overall not feeling well, declining any OOB activities or seated EOB. Pt only agreeable to bed level therex for functional strengthening and ROM including LLE hip abduction (supine and sidelying), L hip flexion, L hip extension in sidelying, RLE SLR and hip abduction and RUE with 4# dumbbell for bicep curls and serratus punches x 15 reps each all x 2 sets. Missed 15 min due to nausea and not feeling well.  Therapy Documentation Precautions:  Precautions Precautions: Fall Precaution Comments: LUE hemi plegic Restrictions Weight Bearing Restrictions: Yes LLE Weight Bearing: Non weight bearing General: PT Amount of Missed Time (min): 15 Minutes PT Missed Treatment Reason: Patient ill (Comment)  Pain: Ongoing pain in abdomen - repositioned as able.    See Function Navigator for Current Functional Status.   Therapy/Group: Individual Therapy  Canary Brim Ivory Broad, PT, DPT  07/27/2017, 2:05 PM

## 2017-07-27 NOTE — Progress Notes (Signed)
Physical Therapy Session Note  Patient Details  Name: Anna Hayes MRN: [id number] Date of Birth: [birth year]  Today's Date: 07/26/2017 PT Individual Time 1340-1435   72mn   Short Term Goals: Week 1:  PT Short Term Goal 1 (Week 1): pt will roll R with min assist PT Short Term Goal 1 - Progress (Week 1): Met PT Short Term Goal 2 (Week 1): pt will move supine<> sit with mod assist, using bed features PT Short Term Goal 2 - Progress (Week 1): Met PT Short Term Goal 3 (Week 1): pt will stand using bed rail with mod assist PT Short Term Goal 3 - Progress (Week 1): Met PT Short Term Goal 4 (Week 1): pt will propel appropriate -height w/c x 150' with supervision PT Short Term Goal 4 - Progress (Week 1): Met Week 2:  PT Short Term Goal 1 (Week 2): pt will move supine> sit with supervision PT Short Term Goal 2 (Week 2): pt will transfer squat pivot to R with mod assist consistently PT Short Term Goal 3 (Week 2): pt will propel w/c in home env x 25' with 2 or less cues for avoiding obstacles  Skilled Therapeutic Interventions/Progress Updates:   Pt received supine in bed and agreeable to PT. Supine>sit transfer with min assist and rails of bed.   Pt reports that she is very Nauseous and feels "clammy" while sitting EOB. Following extended rest break, pt willing to participate further in PT. Pt's daughter and grandson present for family education. Treatment session focused on safety with transfers with moderate verbal, and visual instruction for pt and Family for proper sequencing, positioning, cues for pt and set up. Squat pivot transfers x 10 throughout treatment with 4 completed by daughter to the R and L with mod assist overall.  Pt returned to room and performed stand pivot transfer to bed with mod assist. Sit>supine completed with supervision assist and left supine in bed with call bell in reach and all needs met. Pt required multiple rest breaks throughout due to nausea. RN aware.          Therapy Documentation Precautions:  Precautions Precautions: Fall Precaution Comments: LUE hemi plegic Restrictions Weight Bearing Restrictions: Yes LLE Weight Bearing: Non weight bearing Pain: 0/10  "just nausea"  See Function Navigator for Current Functional Status.   Therapy/Group: Individual Therapy  ALorie Phenix11/09/2016, 5:32 AM

## 2017-07-27 NOTE — Progress Notes (Signed)
Occupational Therapy Session Note  Patient Details  Name: Anna Hayes MRN: [id number] Date of Birth: [birth year]  Today's Date: 07/27/2017 OT Individual Time: 6283-1517 OT Individual Time Calculation (min): 42 min  and Today's Date: 07/27/2017 OT Missed Time: 18 Minutes  Missed Time Reason: Patient ill (comment)   Short Term Goals: Week 2:  OT Short Term Goal 1 (Week 2): Pt will perform toilet transfer with slide board v squat pivot with min A  OT Short Term Goal 2 (Week 2): Pt will Ub dressing with setup OT Short Term Goal 3 (Week 2): Pt will Lb dressing sit to stand with max A  Skilled Therapeutic Interventions/Progress Updates:   Pt received in gym and transitioned from PT session easily. Pt engaged in lateral leans on edge of mat for simulated LB clothing management task. Pt able to pull theraband over R hip but needing assistance to full pull over L hip with min A for balance during lateral leans. Pt reports feeling increased nausea and is clammy. Pt transferred from mat to wheelchair with mod A squat pivot transfer. Therapist assisted pt back to room and RN notified. Temp taken with results of 99.8. Pt reports just feeling unwell. Therapist provided drink, crackers, and cloth to forehead while notifying RN further. Pt requesting to remain in wheelchair at this time. Call bell within reach.   Therapy Documentation Precautions:  Precautions Precautions: Fall Precaution Comments: LUE hemi plegic Restrictions Weight Bearing Restrictions: Yes LLE Weight Bearing: Non weight bearing General: General OT Amount of Missed Time: 18 Minutes Vital Signs:  Pain: Pain Assessment Pain Assessment: 0-10 Pain Score: 2  Faces Pain Scale: Hurts a little bit Pain Type: Chronic pain Pain Location: Shoulder Pain Orientation: Right Pain Radiating Towards: neck Pain Descriptors / Indicators: Aching;Throbbing Pain Frequency: Intermittent Pain Onset: Gradual Patients Stated Pain Goal:  1 Pain Intervention(s): Medication (See eMAR) Multiple Pain Sites: No PAINAD (Pain Assessment in Advanced Dementia) Breathing: normal Critical Care Pain Observation Tool (CPOT) Facial Expression: Relaxed, neutral ADL: ADL ADL Comments: Refer to functional navigator  See Function Navigator for Current Functional Status.   Therapy/Group: Individual Therapy  Gypsy Decant 07/27/2017, 12:33 PM

## 2017-07-27 NOTE — Consult Note (Addendum)
WOC follow-up: Refer to previous progress note by bedside nurse for wound assessment and measurements of sacrum and buttocks wounds.  Both remain unstageable; continue present plan of care with Santyl ointment for enzymatic debridement of nonviable tissue. Bedside nurses are changing abd Vac dressing Q M/W/F; pt will need to follow-up with surgical team after discharge. Please re-consult if further assistance is needed.  Thank-you,  Julien Girt MSN, [place], [place], [place], [place]

## 2017-07-28 ENCOUNTER — Inpatient Hospital Stay (HOSPITAL_COMMUNITY): Payer: Medicare Other

## 2017-07-28 ENCOUNTER — Ambulatory Visit (HOSPITAL_COMMUNITY): Payer: Medicare Other | Admitting: Physical Therapy

## 2017-07-28 ENCOUNTER — Inpatient Hospital Stay (HOSPITAL_COMMUNITY): Payer: Medicare Other | Admitting: Occupational Therapy

## 2017-07-28 DIAGNOSIS — R000 Tachycardia, unspecified: Secondary | ICD-10-CM

## 2017-07-28 LAB — COMPREHENSIVE METABOLIC PANEL
ALBUMIN: 2.7 g/dL — AB (ref 3.5–5.0)
ALK PHOS: 167 U/L — AB (ref 38–126)
ALT: 10 U/L — ABNORMAL LOW (ref 14–54)
ANION GAP: 15 (ref 5–15)
AST: 24 U/L (ref 15–41)
BILIRUBIN TOTAL: 0.9 mg/dL (ref 0.3–1.2)
BUN: 8 mg/dL (ref 6–20)
CALCIUM: 8.8 mg/dL — AB (ref 8.9–10.3)
CO2: 18 mmol/L — ABNORMAL LOW (ref 22–32)
Chloride: 100 mmol/L — ABNORMAL LOW (ref 101–111)
Creatinine, Ser: 0.87 mg/dL (ref 0.44–1.00)
GFR calc Af Amer: >60 mL/min (ref >60)
Glucose, Bld: 88 mg/dL (ref 65–99)
Potassium: 4 mmol/L (ref 3.5–5.1)
Sodium: 133 mmol/L — ABNORMAL LOW (ref 135–145)
TOTAL PROTEIN: 7.4 g/dL (ref 6.5–8.1)

## 2017-07-28 MED ORDER — ADULT MULTIVITAMIN W/MINERALS CH: 1.0000 | ORAL_TABLET | Freq: Every day | ORAL | Status: DC

## 2017-07-28 MED ORDER — CEFPODOXIME PROXETIL 200 MG PO TABS: ORAL_TABLET | ORAL | 0 refills | Status: DC

## 2017-07-28 MED ORDER — GABAPENTIN 300 MG PO CAPS: 300.0000 mg | ORAL_CAPSULE | Freq: Two times a day (BID) | ORAL | 0 refills | Status: DC

## 2017-07-28 MED ORDER — AMLODIPINE BESYLATE 5 MG PO TABS: 5.0000 mg | ORAL_TABLET | Freq: Every day | ORAL | 0 refills | Status: DC

## 2017-07-28 MED ORDER — RIVAROXABAN 20 MG PO TABS: 20.0000 mg | ORAL_TABLET | Freq: Every day | ORAL | 0 refills | Status: DC

## 2017-07-28 MED ORDER — POLYSACCHARIDE IRON COMPLEX 150 MG PO CAPS: 150.0000 mg | ORAL_CAPSULE | Freq: Every day | ORAL | 0 refills | Status: DC

## 2017-07-28 MED ORDER — COLLAGENASE 250 UNIT/GM EX OINT: TOPICAL_OINTMENT | Freq: Every day | CUTANEOUS | 0 refills | Status: DC

## 2017-07-28 MED ORDER — ATENOLOL 25 MG PO TABS
25.0000 mg | ORAL_TABLET | Freq: Every day | ORAL | Status: DC
  Administered 2017-07-28: 25 mg via ORAL
  Filled 2017-07-28: qty 1

## 2017-07-28 MED ORDER — LEVOTHYROXINE SODIUM 25 MCG PO TABS: 25.0000 ug | ORAL_TABLET | Freq: Every day | ORAL | 0 refills | Status: DC

## 2017-07-28 MED ORDER — METRONIDAZOLE 500 MG PO TABS: 500.0000 mg | ORAL_TABLET | Freq: Three times a day (TID) | ORAL | 0 refills | Status: DC

## 2017-07-28 MED ORDER — OXYCODONE-ACETAMINOPHEN 5-325 MG PO TABS: 1.0000 | ORAL_TABLET | ORAL | 0 refills | Status: DC | PRN

## 2017-07-28 MED ORDER — FLUCONAZOLE 200 MG PO TABS: 800.0000 mg | ORAL_TABLET | Freq: Every day | ORAL | 0 refills | Status: DC

## 2017-07-28 MED ORDER — ATENOLOL 50 MG PO TABS: 25.0000 mg | ORAL_TABLET | Freq: Every day | ORAL | 0 refills | Status: DC

## 2017-07-28 MED ORDER — OMEPRAZOLE 40 MG PO CPDR: 40.0000 mg | DELAYED_RELEASE_CAPSULE | Freq: Every day | ORAL | 0 refills | Status: DC

## 2017-07-28 NOTE — Progress Notes (Signed)
[facility] PHYSICAL MEDICINE & REHABILITATION     PROGRESS NOTE    Subjective/Complaints: No new issues. Nausea comes and goes like at home  ROS: pt denies   diarrhea, cough, shortness of breath or chest pain   Objective: Vital Signs: Blood pressure 120/68, pulse (!) 115, temperature 99.2 F (37.3 C), temperature source Oral, resp. rate 17, height 5' 2.01" (1.575 m), weight 63.2 kg (139 lb 5.8 oz), SpO2 100 %. No results found. No results for input(s): WBC, HGB, HCT, PLT in the last 72 hours.  Recent Labs  07/28/17 0457  NA 133*  K 4.0  CL 100*  GLUCOSE 88  BUN 8  CREATININE 0.87  CALCIUM 8.8*   CBG (last 3)  No results for input(s): GLUCAP in the last 72 hours.  Wt Readings from Last 3 Encounters:  07/26/17 63.2 kg (139 lb 5.8 oz)  07/11/17 64 kg (141 lb)    Physical Exam:  Constitutional: She appears well-developed. Obese  HENT: Normocephalic and atraumatic.  Eyes: EOM are normal. Right eye exhibits no discharge. Left eye exhibits no discharge.  Neck: Normal range of motion. Neck supple.  Cardiovascular: tachycardic Respiratory: CTA Bilaterally without wheezes or rales. Normal effort     GI:  BS+, vac in place and sealed. Minimally tender Musculoskeletal: She exhibits less stump edema   Left AKA well formed   Neurological: She is alert.  Left facial weakness with minimal dysarthria.  LUE with edema  Able to follow basic commands without difficulty.  Motor: RUE: 5/5 proximal to distal LUE: 0/5 proximal to distal with tone and contracture in wrist and fingers--no changes RLE: HF, KE 4/5, ADF/PF 4/5 (unchanged) LLE: HF 4/5     Skin:    AK incision with dressing c/d/i. Psychiatric: Her affect is bright  Assessment/Plan: 1. Functional deficits secondary to left AKA which require 3+ hours per day of interdisciplinary therapy in a comprehensive inpatient rehab setting. Physiatrist is providing close team supervision and 24 hour management of active medical  problems listed below. Physiatrist and rehab team continue to assess barriers to discharge/monitor patient progress toward functional and medical goals.  Function:  Bathing Bathing position   Position: Other (comment) (UB EOB, LB supine)  Bathing parts Body parts bathed by patient: Chest, Left arm, Abdomen, Front perineal area, Left upper leg, Right upper leg, Right lower leg Body parts bathed by helper: Right arm, Buttocks, Back  Bathing assist Assist Level:  (min A)      Upper Body Dressing/Undressing Upper body dressing   What is the patient wearing?: Pull over shirt/dress     Pull over shirt/dress - Perfomed by patient: Thread/unthread right sleeve, Thread/unthread left sleeve, Put head through opening, Pull shirt over trunk Pull over shirt/dress - Perfomed by helper: Thread/unthread left sleeve        Upper body assist Assist Level: Supervision or verbal cues   Set up : To obtain clothing/put away  Lower Body Dressing/Undressing Lower body dressing Lower body dressing/undressing activity did not occur: N/A What is the patient wearing?: Pants, Underwear Underwear - Performed by patient: Thread/unthread right underwear leg, Thread/unthread left underwear leg Underwear - Performed by helper: Pull underwear up/down Pants- Performed by patient: Thread/unthread right pants leg, Thread/unthread left pants leg Pants- Performed by helper: Pull pants up/down   Non-skid slipper socks- Performed by helper: Don/doff right sock   Socks - Performed by helper: Don/doff right sock   Shoes - Performed by helper: Don/doff right shoe  Lower body assist Assist for lower body dressing:  (mod A)      Toileting Toileting Toileting activity did not occur: Safety/medical concerns   Toileting steps completed by helper: Adjust clothing prior to toileting, Performs perineal hygiene, Adjust clothing after toileting    Toileting assist Assist level: Two helpers    Transfers Chair/bed transfer Chair/bed transfer activity did not occur: Safety/medical concerns Chair/bed transfer method: Squat pivot Chair/bed transfer assist level: Moderate assist (Pt 50 - 74%/lift or lower) Chair/bed transfer assistive device: Armrests     Locomotion Ambulation Ambulation activity did not occur: Safety/medical concerns (dizzy with standing; LUE hemi)         Wheelchair Wheelchair activity did not occur: N/A Type: Manual Max wheelchair distance: 150' Assist Level: Supervision or verbal cues  Cognition Comprehension Comprehension assist level: Follows basic conversation/direction with no assist  Expression Expression assist level: Expresses basic needs/ideas: With no assist  Social Interaction Social Interaction assist level: Interacts appropriately 90% of the time - Needs monitoring or encouragement for participation or interaction.  Problem Solving Problem solving assist level: Solves basic 90% of the time/requires cueing < 10% of the time, Solves basic problems with no assist  Memory Memory assist level: Recognizes or recalls 90% of the time/requires cueing < 10% of the time   Medical Problem List and Plan: 1.  Decreased functional mobility secondary to left AKA10/06/2017 as well as history of CVA with left-sided residual weakness.  Cont CIR therapies. Family ed  -working toward d/c Saturday 11/3    2.  DVT Prophylaxis/Anticoagulation: Intravenous heparin transitioned to Xarelto on 07/12/2017  RLE with calf edema, warmth, tenderness.   Dopplers neg 3. Pain Management: Neurontin 300 mg twice a day,Oxycodone as needed  - oxycodone prior to therapies 4. Mood:  Provide emotional support 5. Neuropsych: This patient is capable of making decisions on her own behalf. 6. Skin/Wound Care:  Routine skin checks. Vac in place. Will need to remain in place at discharge---follow up after discharge by surgery   -stump staples to be removed as outpt 7.  Fluids/Electrolytes/Nutrition:  Routine I/Os  -all labs personally reviewed 8.Abdominal abscesses. Status post laparotomy, lysis of adhesions repair perforated bowel 06/19/2017 [facility] post percutaneous drain placement October 1 and removed 07/11/2017.Continue Vantin 200 mg every 12 hours, Flagyl 500 mg every 8 hours. abx to continue until 08/09/17  -nausea up and down, likely related to meds too  -leukocytosis resolved. 8k    -afebrile currently     9.Fungemia with endophthalmitis.follow-up ophthalmology services.Intravenous fluconazole 800 mg daily with plan to change to oral therapy per infectious disease with plan for 6 weeks of therapy. Status post [place] Vitrectomy, membrane peeling 07/11/2017  10.Acute blood loss anemia.   hgb 9.9 on 10/26.   Continue iron supplement 11.Hypertension. Norvasc 5 mg daily  -pt also on tenormin at home. Hasn't received in hospital. Likely why HR is up (in addition to anemia)   -resume at 25mg  daily today 12.tobacco abuse. Counseling 13. Constipation. Laxative assistance  -had bm 10/22 14. Hypokalemia  K+ 4.0    stopped supplementation d/t intolerance     LOS (Days) 16 A FACE TO FACE EVALUATION WAS PERFORMED  Meredith Staggers, MD 07/28/2017 8:43 AM

## 2017-07-28 NOTE — Discharge Summary (Signed)
Occupational Therapy Discharge Summary  Patient Details  Name: Nanette Wirsing MRN: [id number] Date of Birth: 1950/12/08  Today's Date: 07/28/2017 OT Individual Time: 1128-1203 and 1300-1400 OT Individual Time Calculation (min): 35 min and 60 min  Patient has met 4 of 7 long term goals due to improved activity tolerance, improved balance, postural control, ability to compensate for deficits, improved attention, improved awareness and improved coordination.  Patient to discharge at overall Mod Assist level.  Patient's care partner is independent and has received hands on family training to provide the necessary physical and cognitive assistance at discharge.    3 goals unmet due to pt requiring increased assist with toileting tasks/transfers + dynamic balance. Pt admitted to CIR with premorbid Lt hemiparesis, and due to new Lt AKA, she had difficultly with acquiring enough Rt sided strength to compensate for entire Lt side during self care tasks and functional transfers. Therefore stated goals were unable to be met.   Recommendation:  Patient will benefit from ongoing skilled OT services in home health setting to continue to advance functional skills in the area of BADL.  Equipment: drop arm BSC  Reasons for discharge: discharge from hospital  Patient/family agrees with progress made and goals achieved: Yes   Skilled Therapeutic Intervention:  Tx focus on sitting balance and activity tolerance, during self care completion.   Pt greeted supine in bed with RN present. C/o persistent nausea. With encouragement, pt agreeable to sit EOB to complete grooming tasks. Pt requiring extra time to transition from supine<sitting with Mod A for elevating trunk on Lt side (due mostly to c/o nausea). Pt completing oral care and grooming tasks with hemi techniques while being provided with education regarding grad day this afternoon and d/c recommendations from OT. With extra time, pt transitioned back  to supine. Able to pull herself up in bed when bed was placed on trendelenberg. She was repositioned for comfort and left with all needs within reach.   2nd Session 1: 1 tx (60 min) Tx focus on OT reevaluation, sitting balance, and adaptive bathing/dressing skills.  Pt greeted supine in bed, requesting to eat a little lunch before starting tx. Still with c/o nausea. Pt transitioning to EOB with supervision to eat her fruit salad. After she completed bathing/dressing EOB for UB and bedlevel for LB. Pt requiring max encouragement to complete self care at max level of independence with AE and adaptive foostool. Pt throughout required rest breaks due to nausea/fatigue. Discussed with RN pts need for fitted shrinker prior to d/c. Continued d/c planning with pt and suggestions to increase ease of self care completion wth caregivers. At end of tx pt was repositioned for comfort and left with all needs within reach.     OT Discharge Precautions/Restrictions  Restrictions Weight Bearing Restrictions: Yes LLE Weight Bearing: Non weight bearing Pain No c/o pain during tx    ADL ADL ADL Comments: Please see functional navigator for ADL status Vision Baseline Vision/History: Wears glasses Wears Glasses: At all times Vision Assessment?: No apparent visual deficits Perception  Perception: Within Functional Limits Praxis Praxis: Intact Cognition Overall Cognitive Status: Within Functional Limits for tasks assessed Arousal/Alertness: Awake/alert Orientation Level: Oriented X4 Attention: Sustained Sustained Attention: Appears intact Memory: Appears intact Awareness: Appears intact Problem Solving: Appears intact Safety/Judgment: Appears intact Sensation Sensation Light Touch: Not tested Stereognosis: Not tested Hot/Cold: Appears Intact Proprioception: Appears Intact Coordination Gross Motor Movements are Fluid and Coordinated: No Fine Motor Movements are Fluid and Coordinated:  No Coordination and Movement  Description:  (affected by Lt hemiplegia + Lt AKA ) Motor  Motor Motor: Hemiplegia Motor - Skilled Clinical Observations: generalized motor weakness (generalized weakness + decreased cardiorespiratory endurance) Trunk/Postural Assessment  Cervical Assessment Cervical Assessment: Within Functional Limits Thoracic Assessment Thoracic Assessment: Within Functional Limits Lumbar Assessment Lumbar Assessment: Within Functional Limits Postural Control Postural Control: Deficits on evaluation (Pt losing balance posteriorally x3 during dynamic LB self care completion EOB)  Balance Balance Balance Assessed: Yes Dynamic Sitting Balance Dynamic Sitting - Level of Assistance: 5: Stand by assistance Sitting balance - Comments:  (during bathing/dressing tasks) Extremity/Trunk Assessment RUE Assessment RUE Assessment: Within Functional Limits LUE Assessment LUE Assessment: Exceptions to WFL LUE Tone LUE Tone Comments:  (CVA 15 years ago with residual spastic hemiparesis. No functional movement present in this limb)  See Function Navigator for Current Functional Status.  Jaide Hillenburg A Alyssa Rotondo 07/28/2017, 5:44 PM

## 2017-07-28 NOTE — Progress Notes (Signed)
Social Work  Discharge Note  The overall goal for the admission was met for:   Discharge location: Yes - home with daughter and grandson to provide 24/7 assistance  Length of Stay: Yes - 16 days  Discharge activity level: Yes - met downgraded goals of mod assistance  Home/community participation: Yes  Services provided included: MD, RD, PT, OT, RN, TR, Pharmacy and [place]: [facility] Medicare  Follow-up services arranged: Home Health: RN, PT, OT via [place], DME: 18x18 hemi/ lightweight w/c with left 1/2 laptray, Jay 2 cushion, hospital bed and wide droparm commode via [place] and Patient/Family has no preference for HH/DME agencies  Comments (or additional information):  Patient/Family verbalized understanding of follow-up arrangements: Yes  Individual responsible for coordination of the follow-up plan: pt  Confirmed correct DME delivered: Prudence Heiny 07/28/2017    Lucindy Borel

## 2017-07-28 NOTE — Progress Notes (Signed)
Physical Therapy Discharge Summary  Patient Details  Name: Anna Hayes MRN: [id number] Date of Birth: 05/14/1951  Today's Date: 07/28/2017 PT Individual Time: 1415-1500 PT Individual Time Calculation (min): 45 min    Patient has met 8 of 8 long term goals due to improved activity tolerance.  Patient to discharge at a wheelchair level Mod Assist.   Patient's care partner is independent to provide the necessary physical assistance at discharge.  Reasons goals not met: all PT goals met.   Recommendation:  Patient will benefit from ongoing skilled PT services in home health setting to continue to advance safe functional mobility, address ongoing impairments in balance, safety, transfers strength, , and minimize fall risk.  Equipment: WC  Reasons for discharge: treatment goals met and discharge from hospital  Patient/family agrees with progress made and goals achieved: Yes   PT therapy  Pt received supine in bed and agreeable to PT. Supine>sit transfer with min assist min cues for bed features as listed below. PT instructed pt in Grad day assessment to measure progress toward goals. See below for details. Patient returned to room and left sitting in WC with call bell in reach and all needs met.     PT Discharge     Cognition Overall Cognitive Status: Within Functional Limits for tasks assessed Arousal/Alertness: Awake/alert Orientation Level: Oriented X4 Attention: Sustained Sustained Attention: Appears intact Memory: Appears intact Awareness: Appears intact Problem Solving: Appears intact Safety/Judgment: Appears intact Sensation Sensation Light Touch: Not tested Stereognosis: Not tested Hot/Cold: Appears Intact Proprioception: Appears Intact Coordination Gross Motor Movements are Fluid and Coordinated: No Fine Motor Movements are Fluid and Coordinated: No Coordination and Movement Description:  (affected by Lt hemiplegia + Lt AKA ) Motor  Motor Motor:  Hemiplegia Motor - Skilled Clinical Observations: generalized motor weakness (generalized weakness + decreased cardiorespiratory endurance)  Mobility Bed Mobility Bed Mobility: Supine to Sit Rolling Right: 5: Supervision Rolling Left: 5: Supervision Supine to Sit: 4: Min guard Sit to Supine: 5: Supervision Sit to Supine - Details: Verbal cues for precautions/safety Transfers Transfers: Yes Stand Pivot Transfers: 2: Max assist Stand Pivot Transfer Details: Visual cues/gestures for sequencing;Verbal cues for sequencing;Manual facilitation for placement Squat Pivot Transfers: 3: Mod assist Squat Pivot Transfer Details: Visual cues/gestures for sequencing;Verbal cues for sequencing Locomotion  Ambulation Ambulation: No Gait Gait: No Stairs / Additional Locomotion Stairs: No Wheelchair Mobility Wheelchair Mobility: Yes Wheelchair Assistance: 5: Supervision Wheelchair Propulsion: Right upper extremity Wheelchair Parts Management: Needs assistance Distance: 150ft   Trunk/Postural Assessment  Cervical Assessment Cervical Assessment: Within Functional Limits Thoracic Assessment Thoracic Assessment: Within Functional Limits Lumbar Assessment Lumbar Assessment: Within Functional Limits Postural Control Postural Control: Deficits on evaluation  Balance Balance Balance Assessed: Yes Static Sitting Balance Static Sitting - Level of Assistance: 6: Modified independent (Device/Increase time) Dynamic Sitting Balance Dynamic Sitting - Level of Assistance: 5: Stand by assistance Static Standing Balance Static Standing - Level of Assistance: 2: Max assist Extremity Assessment      RLE Assessment RLE Assessment: Exceptions to WFL RLE Strength RLE Overall Strength Comments: grossly in sitting: hip flex, knee ext, ankle DF 4/5 LLE Assessment LLE Assessment: Exceptions to WFL LLE Strength LLE Overall Strength Comments: grossly in sitting: hip flex 3+/5; hip abd/add 3+/5   See  Function Navigator for Current Functional Status.  Austin E Tucker 07/28/2017, 3:29 PM  

## 2017-07-28 NOTE — Discharge Planning (Signed)
Pt educated with daughter on home health services. Wound vac changed by floor RN. Ready to leave to go home with daughter.

## 2017-07-28 NOTE — Plan of Care (Signed)
Problem: RH Balance Goal: LTG: Patient will maintain dynamic sitting balance (OT) LTG:  Patient will maintain dynamic sitting balance with assistance during activities of daily living (OT)  Outcome: Not Met (add Reason) Pt with posterior LOBs EOB during dynamic self care tasks at time of d/c, requiring Min A-supervision.   Problem: RH Toileting Goal: LTG Patient will perform toileting w/assist, cues/equip (OT) LTG: Patient will perform toiletiing (clothes management/hygiene) with assist, with/without cues using equipment (OT)  Outcome: Not Met (add Reason) Pt still lacks the physical abilities to weight shift enough for increasing her independence with toileting via lateral leans.   Problem: RH Toilet Transfers Goal: LTG Patient will perform toilet transfers w/assist (OT) LTG: Patient will perform toilet transfers with assist, with/without cues using equipment (OT)  Outcome: Not Met (add Reason) Last documented toilet transfer recorded pt at [facility]

## 2017-07-28 NOTE — Progress Notes (Signed)
Orthopedic Tech Progress Note Patient Details:  Anna Hayes 1951-07-06 [id number]  Ortho Devices Ortho Device/Splint Location: Floor nurse requested that pt need to be re-fitted for stump sock.  Placed call to [facility] for re-fitting.   Har[place], Jaso 07/28/2017, 2:09 PM

## 2017-07-30 DIAGNOSIS — L89322 Pressure ulcer of left buttock, stage 2: Secondary | ICD-10-CM | POA: Diagnosis not present

## 2017-07-30 DIAGNOSIS — Z4800 Encounter for change or removal of nonsurgical wound dressing: Secondary | ICD-10-CM | POA: Diagnosis not present

## 2017-07-30 DIAGNOSIS — L89153 Pressure ulcer of sacral region, stage 3: Secondary | ICD-10-CM | POA: Diagnosis not present

## 2017-07-30 DIAGNOSIS — Z89612 Acquired absence of left leg above knee: Secondary | ICD-10-CM | POA: Diagnosis not present

## 2017-07-30 DIAGNOSIS — I69354 Hemiplegia and hemiparesis following cerebral infarction affecting left non-dominant side: Secondary | ICD-10-CM | POA: Diagnosis not present

## 2017-07-30 DIAGNOSIS — Z4801 Encounter for change or removal of surgical wound dressing: Secondary | ICD-10-CM | POA: Diagnosis not present

## 2017-07-30 DIAGNOSIS — L89312 Pressure ulcer of right buttock, stage 2: Secondary | ICD-10-CM | POA: Diagnosis not present

## 2017-07-30 DIAGNOSIS — K5900 Constipation, unspecified: Secondary | ICD-10-CM | POA: Diagnosis not present

## 2017-07-30 DIAGNOSIS — I10 Essential (primary) hypertension: Secondary | ICD-10-CM | POA: Diagnosis not present

## 2017-07-30 DIAGNOSIS — Z4781 Encounter for orthopedic aftercare following surgical amputation: Secondary | ICD-10-CM | POA: Diagnosis not present

## 2017-07-30 DIAGNOSIS — T8140XD Infection following a procedure, unspecified, subsequent encounter: Secondary | ICD-10-CM | POA: Diagnosis not present

## 2017-07-30 DIAGNOSIS — I739 Peripheral vascular disease, unspecified: Secondary | ICD-10-CM | POA: Diagnosis not present

## 2017-08-02 DIAGNOSIS — L89312 Pressure ulcer of right buttock, stage 2: Secondary | ICD-10-CM | POA: Diagnosis not present

## 2017-08-02 DIAGNOSIS — Z4781 Encounter for orthopedic aftercare following surgical amputation: Secondary | ICD-10-CM | POA: Diagnosis not present

## 2017-08-02 DIAGNOSIS — I739 Peripheral vascular disease, unspecified: Secondary | ICD-10-CM | POA: Diagnosis not present

## 2017-08-02 DIAGNOSIS — L89153 Pressure ulcer of sacral region, stage 3: Secondary | ICD-10-CM | POA: Diagnosis not present

## 2017-08-02 DIAGNOSIS — I10 Essential (primary) hypertension: Secondary | ICD-10-CM | POA: Diagnosis not present

## 2017-08-02 DIAGNOSIS — T8140XD Infection following a procedure, unspecified, subsequent encounter: Secondary | ICD-10-CM | POA: Diagnosis not present

## 2017-08-02 DIAGNOSIS — L89322 Pressure ulcer of left buttock, stage 2: Secondary | ICD-10-CM | POA: Diagnosis not present

## 2017-08-02 DIAGNOSIS — Z89612 Acquired absence of left leg above knee: Secondary | ICD-10-CM | POA: Diagnosis not present

## 2017-08-02 DIAGNOSIS — Z4800 Encounter for change or removal of nonsurgical wound dressing: Secondary | ICD-10-CM | POA: Diagnosis not present

## 2017-08-02 DIAGNOSIS — I69354 Hemiplegia and hemiparesis following cerebral infarction affecting left non-dominant side: Secondary | ICD-10-CM | POA: Diagnosis not present

## 2017-08-02 DIAGNOSIS — Z4801 Encounter for change or removal of surgical wound dressing: Secondary | ICD-10-CM | POA: Diagnosis not present

## 2017-08-02 DIAGNOSIS — K5900 Constipation, unspecified: Secondary | ICD-10-CM | POA: Diagnosis not present

## 2017-08-03 DIAGNOSIS — Z4801 Encounter for change or removal of surgical wound dressing: Secondary | ICD-10-CM | POA: Diagnosis not present

## 2017-08-03 DIAGNOSIS — L89153 Pressure ulcer of sacral region, stage 3: Secondary | ICD-10-CM | POA: Diagnosis not present

## 2017-08-03 DIAGNOSIS — Z4800 Encounter for change or removal of nonsurgical wound dressing: Secondary | ICD-10-CM | POA: Diagnosis not present

## 2017-08-03 DIAGNOSIS — Z4781 Encounter for orthopedic aftercare following surgical amputation: Secondary | ICD-10-CM | POA: Diagnosis not present

## 2017-08-03 DIAGNOSIS — L89312 Pressure ulcer of right buttock, stage 2: Secondary | ICD-10-CM | POA: Diagnosis not present

## 2017-08-03 DIAGNOSIS — K5900 Constipation, unspecified: Secondary | ICD-10-CM | POA: Diagnosis not present

## 2017-08-03 DIAGNOSIS — I69354 Hemiplegia and hemiparesis following cerebral infarction affecting left non-dominant side: Secondary | ICD-10-CM | POA: Diagnosis not present

## 2017-08-03 DIAGNOSIS — T8140XD Infection following a procedure, unspecified, subsequent encounter: Secondary | ICD-10-CM | POA: Diagnosis not present

## 2017-08-03 DIAGNOSIS — L89322 Pressure ulcer of left buttock, stage 2: Secondary | ICD-10-CM | POA: Diagnosis not present

## 2017-08-03 DIAGNOSIS — Z89612 Acquired absence of left leg above knee: Secondary | ICD-10-CM | POA: Diagnosis not present

## 2017-08-03 DIAGNOSIS — I10 Essential (primary) hypertension: Secondary | ICD-10-CM | POA: Diagnosis not present

## 2017-08-03 DIAGNOSIS — I739 Peripheral vascular disease, unspecified: Secondary | ICD-10-CM | POA: Diagnosis not present

## 2017-08-04 DIAGNOSIS — T8140XD Infection following a procedure, unspecified, subsequent encounter: Secondary | ICD-10-CM | POA: Diagnosis not present

## 2017-08-04 DIAGNOSIS — Z4781 Encounter for orthopedic aftercare following surgical amputation: Secondary | ICD-10-CM | POA: Diagnosis not present

## 2017-08-04 DIAGNOSIS — I739 Peripheral vascular disease, unspecified: Secondary | ICD-10-CM | POA: Diagnosis not present

## 2017-08-04 DIAGNOSIS — L89153 Pressure ulcer of sacral region, stage 3: Secondary | ICD-10-CM | POA: Diagnosis not present

## 2017-08-04 DIAGNOSIS — Z4800 Encounter for change or removal of nonsurgical wound dressing: Secondary | ICD-10-CM | POA: Diagnosis not present

## 2017-08-04 DIAGNOSIS — I69354 Hemiplegia and hemiparesis following cerebral infarction affecting left non-dominant side: Secondary | ICD-10-CM | POA: Diagnosis not present

## 2017-08-04 DIAGNOSIS — K5900 Constipation, unspecified: Secondary | ICD-10-CM | POA: Diagnosis not present

## 2017-08-04 DIAGNOSIS — I10 Essential (primary) hypertension: Secondary | ICD-10-CM | POA: Diagnosis not present

## 2017-08-04 DIAGNOSIS — Z4801 Encounter for change or removal of surgical wound dressing: Secondary | ICD-10-CM | POA: Diagnosis not present

## 2017-08-04 DIAGNOSIS — L89312 Pressure ulcer of right buttock, stage 2: Secondary | ICD-10-CM | POA: Diagnosis not present

## 2017-08-04 DIAGNOSIS — L89322 Pressure ulcer of left buttock, stage 2: Secondary | ICD-10-CM | POA: Diagnosis not present

## 2017-08-04 DIAGNOSIS — Z89612 Acquired absence of left leg above knee: Secondary | ICD-10-CM | POA: Diagnosis not present

## 2017-08-07 DIAGNOSIS — Z89612 Acquired absence of left leg above knee: Secondary | ICD-10-CM | POA: Diagnosis not present

## 2017-08-07 DIAGNOSIS — L89312 Pressure ulcer of right buttock, stage 2: Secondary | ICD-10-CM | POA: Diagnosis not present

## 2017-08-07 DIAGNOSIS — Z4801 Encounter for change or removal of surgical wound dressing: Secondary | ICD-10-CM | POA: Diagnosis not present

## 2017-08-07 DIAGNOSIS — Z4781 Encounter for orthopedic aftercare following surgical amputation: Secondary | ICD-10-CM | POA: Diagnosis not present

## 2017-08-07 DIAGNOSIS — I69354 Hemiplegia and hemiparesis following cerebral infarction affecting left non-dominant side: Secondary | ICD-10-CM | POA: Diagnosis not present

## 2017-08-07 DIAGNOSIS — T8140XD Infection following a procedure, unspecified, subsequent encounter: Secondary | ICD-10-CM | POA: Diagnosis not present

## 2017-08-07 DIAGNOSIS — Z4800 Encounter for change or removal of nonsurgical wound dressing: Secondary | ICD-10-CM | POA: Diagnosis not present

## 2017-08-07 DIAGNOSIS — I10 Essential (primary) hypertension: Secondary | ICD-10-CM | POA: Diagnosis not present

## 2017-08-07 DIAGNOSIS — I739 Peripheral vascular disease, unspecified: Secondary | ICD-10-CM | POA: Diagnosis not present

## 2017-08-07 DIAGNOSIS — L89322 Pressure ulcer of left buttock, stage 2: Secondary | ICD-10-CM | POA: Diagnosis not present

## 2017-08-07 DIAGNOSIS — L89153 Pressure ulcer of sacral region, stage 3: Secondary | ICD-10-CM | POA: Diagnosis not present

## 2017-08-07 DIAGNOSIS — K5900 Constipation, unspecified: Secondary | ICD-10-CM | POA: Diagnosis not present

## 2017-08-08 DIAGNOSIS — I69354 Hemiplegia and hemiparesis following cerebral infarction affecting left non-dominant side: Secondary | ICD-10-CM | POA: Diagnosis not present

## 2017-08-08 DIAGNOSIS — L89153 Pressure ulcer of sacral region, stage 3: Secondary | ICD-10-CM | POA: Diagnosis not present

## 2017-08-08 DIAGNOSIS — T8140XD Infection following a procedure, unspecified, subsequent encounter: Secondary | ICD-10-CM | POA: Diagnosis not present

## 2017-08-08 DIAGNOSIS — K5900 Constipation, unspecified: Secondary | ICD-10-CM | POA: Diagnosis not present

## 2017-08-08 DIAGNOSIS — I739 Peripheral vascular disease, unspecified: Secondary | ICD-10-CM | POA: Diagnosis not present

## 2017-08-08 DIAGNOSIS — L89312 Pressure ulcer of right buttock, stage 2: Secondary | ICD-10-CM | POA: Diagnosis not present

## 2017-08-08 DIAGNOSIS — Z4781 Encounter for orthopedic aftercare following surgical amputation: Secondary | ICD-10-CM | POA: Diagnosis not present

## 2017-08-08 DIAGNOSIS — Z4800 Encounter for change or removal of nonsurgical wound dressing: Secondary | ICD-10-CM | POA: Diagnosis not present

## 2017-08-08 DIAGNOSIS — Z4801 Encounter for change or removal of surgical wound dressing: Secondary | ICD-10-CM | POA: Diagnosis not present

## 2017-08-08 DIAGNOSIS — Z89612 Acquired absence of left leg above knee: Secondary | ICD-10-CM | POA: Diagnosis not present

## 2017-08-08 DIAGNOSIS — I10 Essential (primary) hypertension: Secondary | ICD-10-CM | POA: Diagnosis not present

## 2017-08-08 DIAGNOSIS — L89322 Pressure ulcer of left buttock, stage 2: Secondary | ICD-10-CM | POA: Diagnosis not present

## 2017-08-09 DIAGNOSIS — Z89612 Acquired absence of left leg above knee: Secondary | ICD-10-CM | POA: Diagnosis not present

## 2017-08-09 DIAGNOSIS — E876 Hypokalemia: Secondary | ICD-10-CM | POA: Diagnosis not present

## 2017-08-09 DIAGNOSIS — Z4801 Encounter for change or removal of surgical wound dressing: Secondary | ICD-10-CM | POA: Diagnosis not present

## 2017-08-09 DIAGNOSIS — L89153 Pressure ulcer of sacral region, stage 3: Secondary | ICD-10-CM | POA: Diagnosis not present

## 2017-08-09 DIAGNOSIS — M79661 Pain in right lower leg: Secondary | ICD-10-CM | POA: Diagnosis not present

## 2017-08-09 DIAGNOSIS — I10 Essential (primary) hypertension: Secondary | ICD-10-CM | POA: Diagnosis not present

## 2017-08-09 DIAGNOSIS — L89322 Pressure ulcer of left buttock, stage 2: Secondary | ICD-10-CM | POA: Diagnosis not present

## 2017-08-09 DIAGNOSIS — T8140XD Infection following a procedure, unspecified, subsequent encounter: Secondary | ICD-10-CM | POA: Diagnosis not present

## 2017-08-09 DIAGNOSIS — K5900 Constipation, unspecified: Secondary | ICD-10-CM | POA: Diagnosis not present

## 2017-08-09 DIAGNOSIS — L89312 Pressure ulcer of right buttock, stage 2: Secondary | ICD-10-CM | POA: Diagnosis not present

## 2017-08-09 DIAGNOSIS — I739 Peripheral vascular disease, unspecified: Secondary | ICD-10-CM | POA: Diagnosis not present

## 2017-08-09 DIAGNOSIS — I69354 Hemiplegia and hemiparesis following cerebral infarction affecting left non-dominant side: Secondary | ICD-10-CM | POA: Diagnosis not present

## 2017-08-09 DIAGNOSIS — Z89611 Acquired absence of right leg above knee: Secondary | ICD-10-CM | POA: Diagnosis not present

## 2017-08-09 DIAGNOSIS — Z4800 Encounter for change or removal of nonsurgical wound dressing: Secondary | ICD-10-CM | POA: Diagnosis not present

## 2017-08-09 DIAGNOSIS — S78112A Complete traumatic amputation at level between left hip and knee, initial encounter: Secondary | ICD-10-CM | POA: Diagnosis not present

## 2017-08-09 DIAGNOSIS — Z4781 Encounter for orthopedic aftercare following surgical amputation: Secondary | ICD-10-CM | POA: Diagnosis not present

## 2017-08-11 DIAGNOSIS — I10 Essential (primary) hypertension: Secondary | ICD-10-CM | POA: Diagnosis not present

## 2017-08-11 DIAGNOSIS — Z4800 Encounter for change or removal of nonsurgical wound dressing: Secondary | ICD-10-CM | POA: Diagnosis not present

## 2017-08-11 DIAGNOSIS — L89312 Pressure ulcer of right buttock, stage 2: Secondary | ICD-10-CM | POA: Diagnosis not present

## 2017-08-11 DIAGNOSIS — T8140XD Infection following a procedure, unspecified, subsequent encounter: Secondary | ICD-10-CM | POA: Diagnosis not present

## 2017-08-11 DIAGNOSIS — Z89612 Acquired absence of left leg above knee: Secondary | ICD-10-CM | POA: Diagnosis not present

## 2017-08-11 DIAGNOSIS — I69354 Hemiplegia and hemiparesis following cerebral infarction affecting left non-dominant side: Secondary | ICD-10-CM | POA: Diagnosis not present

## 2017-08-11 DIAGNOSIS — L89322 Pressure ulcer of left buttock, stage 2: Secondary | ICD-10-CM | POA: Diagnosis not present

## 2017-08-11 DIAGNOSIS — L89153 Pressure ulcer of sacral region, stage 3: Secondary | ICD-10-CM | POA: Diagnosis not present

## 2017-08-11 DIAGNOSIS — Z4781 Encounter for orthopedic aftercare following surgical amputation: Secondary | ICD-10-CM | POA: Diagnosis not present

## 2017-08-11 DIAGNOSIS — K5900 Constipation, unspecified: Secondary | ICD-10-CM | POA: Diagnosis not present

## 2017-08-11 DIAGNOSIS — Z4801 Encounter for change or removal of surgical wound dressing: Secondary | ICD-10-CM | POA: Diagnosis not present

## 2017-08-11 DIAGNOSIS — I739 Peripheral vascular disease, unspecified: Secondary | ICD-10-CM | POA: Diagnosis not present

## 2017-08-14 DIAGNOSIS — Z89612 Acquired absence of left leg above knee: Secondary | ICD-10-CM | POA: Diagnosis not present

## 2017-08-14 DIAGNOSIS — K5900 Constipation, unspecified: Secondary | ICD-10-CM | POA: Diagnosis not present

## 2017-08-14 DIAGNOSIS — I10 Essential (primary) hypertension: Secondary | ICD-10-CM | POA: Diagnosis not present

## 2017-08-14 DIAGNOSIS — Z4781 Encounter for orthopedic aftercare following surgical amputation: Secondary | ICD-10-CM | POA: Diagnosis not present

## 2017-08-14 DIAGNOSIS — I739 Peripheral vascular disease, unspecified: Secondary | ICD-10-CM | POA: Diagnosis not present

## 2017-08-14 DIAGNOSIS — L89322 Pressure ulcer of left buttock, stage 2: Secondary | ICD-10-CM | POA: Diagnosis not present

## 2017-08-14 DIAGNOSIS — Z4801 Encounter for change or removal of surgical wound dressing: Secondary | ICD-10-CM | POA: Diagnosis not present

## 2017-08-14 DIAGNOSIS — Z4800 Encounter for change or removal of nonsurgical wound dressing: Secondary | ICD-10-CM | POA: Diagnosis not present

## 2017-08-14 DIAGNOSIS — I69354 Hemiplegia and hemiparesis following cerebral infarction affecting left non-dominant side: Secondary | ICD-10-CM | POA: Diagnosis not present

## 2017-08-14 DIAGNOSIS — L89312 Pressure ulcer of right buttock, stage 2: Secondary | ICD-10-CM | POA: Diagnosis not present

## 2017-08-14 DIAGNOSIS — T8140XD Infection following a procedure, unspecified, subsequent encounter: Secondary | ICD-10-CM | POA: Diagnosis not present

## 2017-08-14 DIAGNOSIS — L89153 Pressure ulcer of sacral region, stage 3: Secondary | ICD-10-CM | POA: Diagnosis not present

## 2017-08-16 ENCOUNTER — Inpatient Hospital Stay: Payer: Medicare Other | Admitting: Infectious Disease

## 2017-08-18 DIAGNOSIS — I10 Essential (primary) hypertension: Secondary | ICD-10-CM | POA: Diagnosis not present

## 2017-08-18 DIAGNOSIS — L89153 Pressure ulcer of sacral region, stage 3: Secondary | ICD-10-CM | POA: Diagnosis not present

## 2017-08-18 DIAGNOSIS — Z89612 Acquired absence of left leg above knee: Secondary | ICD-10-CM | POA: Diagnosis not present

## 2017-08-18 DIAGNOSIS — K5900 Constipation, unspecified: Secondary | ICD-10-CM | POA: Diagnosis not present

## 2017-08-18 DIAGNOSIS — T8140XD Infection following a procedure, unspecified, subsequent encounter: Secondary | ICD-10-CM | POA: Diagnosis not present

## 2017-08-18 DIAGNOSIS — Z4801 Encounter for change or removal of surgical wound dressing: Secondary | ICD-10-CM | POA: Diagnosis not present

## 2017-08-18 DIAGNOSIS — I739 Peripheral vascular disease, unspecified: Secondary | ICD-10-CM | POA: Diagnosis not present

## 2017-08-18 DIAGNOSIS — I69354 Hemiplegia and hemiparesis following cerebral infarction affecting left non-dominant side: Secondary | ICD-10-CM | POA: Diagnosis not present

## 2017-08-18 DIAGNOSIS — Z4781 Encounter for orthopedic aftercare following surgical amputation: Secondary | ICD-10-CM | POA: Diagnosis not present

## 2017-08-18 DIAGNOSIS — L89322 Pressure ulcer of left buttock, stage 2: Secondary | ICD-10-CM | POA: Diagnosis not present

## 2017-08-18 DIAGNOSIS — Z4800 Encounter for change or removal of nonsurgical wound dressing: Secondary | ICD-10-CM | POA: Diagnosis not present

## 2017-08-18 DIAGNOSIS — L89312 Pressure ulcer of right buttock, stage 2: Secondary | ICD-10-CM | POA: Diagnosis not present

## 2017-08-21 DIAGNOSIS — I69354 Hemiplegia and hemiparesis following cerebral infarction affecting left non-dominant side: Secondary | ICD-10-CM | POA: Diagnosis not present

## 2017-08-21 DIAGNOSIS — L89322 Pressure ulcer of left buttock, stage 2: Secondary | ICD-10-CM | POA: Diagnosis not present

## 2017-08-21 DIAGNOSIS — Z89612 Acquired absence of left leg above knee: Secondary | ICD-10-CM | POA: Diagnosis not present

## 2017-08-21 DIAGNOSIS — T8140XD Infection following a procedure, unspecified, subsequent encounter: Secondary | ICD-10-CM | POA: Diagnosis not present

## 2017-08-21 DIAGNOSIS — Z4800 Encounter for change or removal of nonsurgical wound dressing: Secondary | ICD-10-CM | POA: Diagnosis not present

## 2017-08-21 DIAGNOSIS — I10 Essential (primary) hypertension: Secondary | ICD-10-CM | POA: Diagnosis not present

## 2017-08-21 DIAGNOSIS — K5900 Constipation, unspecified: Secondary | ICD-10-CM | POA: Diagnosis not present

## 2017-08-21 DIAGNOSIS — Z4801 Encounter for change or removal of surgical wound dressing: Secondary | ICD-10-CM | POA: Diagnosis not present

## 2017-08-21 DIAGNOSIS — L89312 Pressure ulcer of right buttock, stage 2: Secondary | ICD-10-CM | POA: Diagnosis not present

## 2017-08-21 DIAGNOSIS — I739 Peripheral vascular disease, unspecified: Secondary | ICD-10-CM | POA: Diagnosis not present

## 2017-08-21 DIAGNOSIS — L89153 Pressure ulcer of sacral region, stage 3: Secondary | ICD-10-CM | POA: Diagnosis not present

## 2017-08-21 DIAGNOSIS — Z4781 Encounter for orthopedic aftercare following surgical amputation: Secondary | ICD-10-CM | POA: Diagnosis not present

## 2017-08-22 ENCOUNTER — Telehealth: Payer: Self-pay

## 2017-08-22 NOTE — Telephone Encounter (Signed)
Anna Hayes (not sure what facility he is from, didn't specify) called stating that post op care is on track, and that patient still has staples in amputation side and patient is concerned about pain medication to which she would be able to qualify for.

## 2017-08-23 DIAGNOSIS — Z4800 Encounter for change or removal of nonsurgical wound dressing: Secondary | ICD-10-CM | POA: Diagnosis not present

## 2017-08-23 DIAGNOSIS — Z4781 Encounter for orthopedic aftercare following surgical amputation: Secondary | ICD-10-CM | POA: Diagnosis not present

## 2017-08-23 DIAGNOSIS — Z4801 Encounter for change or removal of surgical wound dressing: Secondary | ICD-10-CM | POA: Diagnosis not present

## 2017-08-23 DIAGNOSIS — I10 Essential (primary) hypertension: Secondary | ICD-10-CM | POA: Diagnosis not present

## 2017-08-23 DIAGNOSIS — Z89612 Acquired absence of left leg above knee: Secondary | ICD-10-CM | POA: Diagnosis not present

## 2017-08-23 DIAGNOSIS — I69354 Hemiplegia and hemiparesis following cerebral infarction affecting left non-dominant side: Secondary | ICD-10-CM | POA: Diagnosis not present

## 2017-08-23 DIAGNOSIS — L89312 Pressure ulcer of right buttock, stage 2: Secondary | ICD-10-CM | POA: Diagnosis not present

## 2017-08-23 DIAGNOSIS — T8140XD Infection following a procedure, unspecified, subsequent encounter: Secondary | ICD-10-CM | POA: Diagnosis not present

## 2017-08-23 DIAGNOSIS — L89322 Pressure ulcer of left buttock, stage 2: Secondary | ICD-10-CM | POA: Diagnosis not present

## 2017-08-23 DIAGNOSIS — L89153 Pressure ulcer of sacral region, stage 3: Secondary | ICD-10-CM | POA: Diagnosis not present

## 2017-08-23 DIAGNOSIS — I739 Peripheral vascular disease, unspecified: Secondary | ICD-10-CM | POA: Diagnosis not present

## 2017-08-23 DIAGNOSIS — K5900 Constipation, unspecified: Secondary | ICD-10-CM | POA: Diagnosis not present

## 2017-08-23 MED ORDER — OXYCODONE-ACETAMINOPHEN 5-325 MG PO TABS: 1.0000 | ORAL_TABLET | ORAL | 0 refills | Status: DC | PRN

## 2017-08-23 NOTE — Telephone Encounter (Signed)
I refilled percocet. Further refill will require OV.

## 2017-08-23 NOTE — Telephone Encounter (Signed)
Ronalee Belts OT notified and he will let them know about the rx and the appt 08/29/17 @ 11:20 arrive by 11:00.

## 2017-08-23 NOTE — Telephone Encounter (Signed)
Doctor in facility should be helping him with pain medications. Staples in per discretion of surgeon

## 2017-08-23 NOTE — Telephone Encounter (Signed)
Message was  relayed per secure VM of Ronalee Belts  OT [facility]. I looked at discharge and Mrs Reiland went home with family after inpt d/c, so he was not calling from a facility.  Dan rx percocet 5/325 #30 at discharge 07/29/17.  She has an appointment with you 08/29/17.  Please advise.

## 2017-08-25 DIAGNOSIS — Z4800 Encounter for change or removal of nonsurgical wound dressing: Secondary | ICD-10-CM | POA: Diagnosis not present

## 2017-08-25 DIAGNOSIS — Z4781 Encounter for orthopedic aftercare following surgical amputation: Secondary | ICD-10-CM | POA: Diagnosis not present

## 2017-08-25 DIAGNOSIS — I10 Essential (primary) hypertension: Secondary | ICD-10-CM | POA: Diagnosis not present

## 2017-08-25 DIAGNOSIS — Z89612 Acquired absence of left leg above knee: Secondary | ICD-10-CM | POA: Diagnosis not present

## 2017-08-25 DIAGNOSIS — L89312 Pressure ulcer of right buttock, stage 2: Secondary | ICD-10-CM | POA: Diagnosis not present

## 2017-08-25 DIAGNOSIS — L89322 Pressure ulcer of left buttock, stage 2: Secondary | ICD-10-CM | POA: Diagnosis not present

## 2017-08-25 DIAGNOSIS — Z4801 Encounter for change or removal of surgical wound dressing: Secondary | ICD-10-CM | POA: Diagnosis not present

## 2017-08-25 DIAGNOSIS — I739 Peripheral vascular disease, unspecified: Secondary | ICD-10-CM | POA: Diagnosis not present

## 2017-08-25 DIAGNOSIS — L89153 Pressure ulcer of sacral region, stage 3: Secondary | ICD-10-CM | POA: Diagnosis not present

## 2017-08-25 DIAGNOSIS — I69354 Hemiplegia and hemiparesis following cerebral infarction affecting left non-dominant side: Secondary | ICD-10-CM | POA: Diagnosis not present

## 2017-08-25 DIAGNOSIS — T8140XD Infection following a procedure, unspecified, subsequent encounter: Secondary | ICD-10-CM | POA: Diagnosis not present

## 2017-08-25 DIAGNOSIS — K5900 Constipation, unspecified: Secondary | ICD-10-CM | POA: Diagnosis not present

## 2017-08-26 DIAGNOSIS — L89312 Pressure ulcer of right buttock, stage 2: Secondary | ICD-10-CM | POA: Diagnosis not present

## 2017-08-26 DIAGNOSIS — K5900 Constipation, unspecified: Secondary | ICD-10-CM | POA: Diagnosis not present

## 2017-08-26 DIAGNOSIS — I69354 Hemiplegia and hemiparesis following cerebral infarction affecting left non-dominant side: Secondary | ICD-10-CM | POA: Diagnosis not present

## 2017-08-26 DIAGNOSIS — T8140XD Infection following a procedure, unspecified, subsequent encounter: Secondary | ICD-10-CM | POA: Diagnosis not present

## 2017-08-26 DIAGNOSIS — I10 Essential (primary) hypertension: Secondary | ICD-10-CM | POA: Diagnosis not present

## 2017-08-26 DIAGNOSIS — L89322 Pressure ulcer of left buttock, stage 2: Secondary | ICD-10-CM | POA: Diagnosis not present

## 2017-08-26 DIAGNOSIS — Z4801 Encounter for change or removal of surgical wound dressing: Secondary | ICD-10-CM | POA: Diagnosis not present

## 2017-08-26 DIAGNOSIS — Z4781 Encounter for orthopedic aftercare following surgical amputation: Secondary | ICD-10-CM | POA: Diagnosis not present

## 2017-08-26 DIAGNOSIS — Z89612 Acquired absence of left leg above knee: Secondary | ICD-10-CM | POA: Diagnosis not present

## 2017-08-26 DIAGNOSIS — Z4800 Encounter for change or removal of nonsurgical wound dressing: Secondary | ICD-10-CM | POA: Diagnosis not present

## 2017-08-26 DIAGNOSIS — I739 Peripheral vascular disease, unspecified: Secondary | ICD-10-CM | POA: Diagnosis not present

## 2017-08-26 DIAGNOSIS — L89153 Pressure ulcer of sacral region, stage 3: Secondary | ICD-10-CM | POA: Diagnosis not present

## 2017-08-26 DIAGNOSIS — T8189XA Other complications of procedures, not elsewhere classified, initial encounter: Secondary | ICD-10-CM | POA: Diagnosis not present

## 2017-08-27 DIAGNOSIS — S78112A Complete traumatic amputation at level between left hip and knee, initial encounter: Secondary | ICD-10-CM | POA: Diagnosis not present

## 2017-08-28 DIAGNOSIS — Z4781 Encounter for orthopedic aftercare following surgical amputation: Secondary | ICD-10-CM | POA: Diagnosis not present

## 2017-08-28 DIAGNOSIS — L89153 Pressure ulcer of sacral region, stage 3: Secondary | ICD-10-CM | POA: Diagnosis not present

## 2017-08-28 DIAGNOSIS — Z89612 Acquired absence of left leg above knee: Secondary | ICD-10-CM | POA: Diagnosis not present

## 2017-08-28 DIAGNOSIS — I69354 Hemiplegia and hemiparesis following cerebral infarction affecting left non-dominant side: Secondary | ICD-10-CM | POA: Diagnosis not present

## 2017-08-28 DIAGNOSIS — L89322 Pressure ulcer of left buttock, stage 2: Secondary | ICD-10-CM | POA: Diagnosis not present

## 2017-08-28 DIAGNOSIS — T8140XD Infection following a procedure, unspecified, subsequent encounter: Secondary | ICD-10-CM | POA: Diagnosis not present

## 2017-08-28 DIAGNOSIS — Z4801 Encounter for change or removal of surgical wound dressing: Secondary | ICD-10-CM | POA: Diagnosis not present

## 2017-08-28 DIAGNOSIS — I10 Essential (primary) hypertension: Secondary | ICD-10-CM | POA: Diagnosis not present

## 2017-08-28 DIAGNOSIS — K5900 Constipation, unspecified: Secondary | ICD-10-CM | POA: Diagnosis not present

## 2017-08-28 DIAGNOSIS — L89312 Pressure ulcer of right buttock, stage 2: Secondary | ICD-10-CM | POA: Diagnosis not present

## 2017-08-28 DIAGNOSIS — Z4800 Encounter for change or removal of nonsurgical wound dressing: Secondary | ICD-10-CM | POA: Diagnosis not present

## 2017-08-28 DIAGNOSIS — I739 Peripheral vascular disease, unspecified: Secondary | ICD-10-CM | POA: Diagnosis not present

## 2017-08-29 ENCOUNTER — Other Ambulatory Visit: Payer: Self-pay

## 2017-08-29 ENCOUNTER — Encounter: Payer: Medicare Other | Attending: Physical Medicine & Rehabilitation | Admitting: Physical Medicine & Rehabilitation

## 2017-08-29 ENCOUNTER — Encounter: Payer: Self-pay | Admitting: Physical Medicine & Rehabilitation

## 2017-08-29 VITALS — BP 134/80 | HR 87

## 2017-08-29 DIAGNOSIS — Z89612 Acquired absence of left leg above knee: Secondary | ICD-10-CM | POA: Diagnosis not present

## 2017-08-29 DIAGNOSIS — I69354 Hemiplegia and hemiparesis following cerebral infarction affecting left non-dominant side: Secondary | ICD-10-CM | POA: Diagnosis not present

## 2017-08-29 DIAGNOSIS — Z4781 Encounter for orthopedic aftercare following surgical amputation: Secondary | ICD-10-CM | POA: Diagnosis not present

## 2017-08-29 DIAGNOSIS — H44009 Unspecified purulent endophthalmitis, unspecified eye: Secondary | ICD-10-CM | POA: Diagnosis not present

## 2017-08-29 DIAGNOSIS — L02211 Cutaneous abscess of abdominal wall: Secondary | ICD-10-CM | POA: Insufficient documentation

## 2017-08-29 DIAGNOSIS — Z79899 Other long term (current) drug therapy: Secondary | ICD-10-CM | POA: Diagnosis not present

## 2017-08-29 DIAGNOSIS — R51 Headache: Secondary | ICD-10-CM | POA: Insufficient documentation

## 2017-08-29 DIAGNOSIS — G43919 Migraine, unspecified, intractable, without status migrainosus: Secondary | ICD-10-CM | POA: Insufficient documentation

## 2017-08-29 DIAGNOSIS — G43001 Migraine without aura, not intractable, with status migrainosus: Secondary | ICD-10-CM

## 2017-08-29 DIAGNOSIS — Z9889 Other specified postprocedural states: Secondary | ICD-10-CM | POA: Diagnosis not present

## 2017-08-29 DIAGNOSIS — M792 Neuralgia and neuritis, unspecified: Secondary | ICD-10-CM

## 2017-08-29 DIAGNOSIS — G43009 Migraine without aura, not intractable, without status migrainosus: Secondary | ICD-10-CM | POA: Insufficient documentation

## 2017-08-29 DIAGNOSIS — S78112A Complete traumatic amputation at level between left hip and knee, initial encounter: Secondary | ICD-10-CM

## 2017-08-29 DIAGNOSIS — G894 Chronic pain syndrome: Secondary | ICD-10-CM

## 2017-08-29 DIAGNOSIS — Z5181 Encounter for therapeutic drug level monitoring: Secondary | ICD-10-CM | POA: Diagnosis not present

## 2017-08-29 DIAGNOSIS — F17210 Nicotine dependence, cigarettes, uncomplicated: Secondary | ICD-10-CM | POA: Diagnosis not present

## 2017-08-29 MED ORDER — OXYCODONE-ACETAMINOPHEN 5-325 MG PO TABS: 1.0000 | ORAL_TABLET | ORAL | 0 refills | Status: DC | PRN

## 2017-08-29 MED ORDER — TOPIRAMATE 25 MG PO TABS: 25.0000 mg | ORAL_TABLET | Freq: Every day | ORAL | 2 refills | Status: DC

## 2017-08-29 NOTE — Progress Notes (Signed)
Subjective:    Patient ID: Anna Hayes, female    DOB: December 26, 1950, 66 y.o.   MRN: [id number]  HPI   Mrs. Wien is here in follow up of her left AKA.  She has been home for the last month and been doing fairly well for the most part.  Therapy is working on leg strength and standing.  She can now transfer more easily using her right leg.  From a pain standpoint she has pain radiating from her low back to both hips/legs. Percocet has helped the pain.  She states that the pain has been more severe in her back since she was in the hospital.  Tends to be a bit better when she is sitting and moving around.  Additionally she complains of ongoing migraine headaches.  She was on gabapentin for phantom pain but she did not notice any benefit with use of the phantom limb pain or the headaches while on the gabapentin.  She had been taking Fioricet in the past with some relief for rescue symptoms.  She is completed her antibiotics and has an infectious disease visit set up this week.  She is now moving her bowels without any issues.  She still has the staples in her left leg but is not yet seen vascular surgery in follow-up.   Pain Inventory Average Pain 6 Pain Right Now 8 My pain is stabbing  In the last 24 hours, has pain interfered with the following? General activity 3 Relation with others 3 Enjoyment of life 3 What TIME of day is your pain at its worst? varies Sleep (in general) Poor  Pain is worse with: . Pain improves with: medication Relief from Meds: 5  Mobility walk with assistance ability to climb steps?  no do you drive?  no use a wheelchair needs help with transfers Do you have any goals in this area?  yes  Function retired I need assistance with the following:  dressing, toileting, household duties and shopping  Neuro/Psych bladder control problems bowel control problems  Prior Studies Any changes since last visit?  no  Physicians involved in your  care Any changes since last visit?  no   No family history on file. Social History   Socioeconomic History  . Marital status: Widowed    Spouse name: Not on file  . Number of children: Not on file  . Years of education: Not on file  . Highest education level: Not on file  Social Needs  . Financial resource strain: Not on file  . Food insecurity - worry: Not on file  . Food insecurity - inability: Not on file  . Transportation needs - medical: Not on file  . Transportation needs - non-medical: Not on file  Occupational History  . Not on file  Tobacco Use  . Smoking status: Current Every Day Smoker    Packs/day: 0.50    Types: Cigarettes  . Smokeless tobacco: Never Used  Substance and Sexual Activity  . Alcohol use: No  . Drug use: No  . Sexual activity: Not on file  Other Topics Concern  . Not on file  Social History Narrative  . Not on file   Past Surgical History:  Procedure Laterality Date  . AMPUTATION Left 07/05/2017   Procedure: LEFT ABOVE KNEE AMPUTATION;  Surgeon: Conrad New Rockford, MD;  Location: [place];  Service: Vascular;  Laterality: Left;  . EMBOLECTOMY Bilateral 06/27/2017   Procedure: BILATERAL FEMORAL POPLITEAL EMBOLECTOMY;  Surgeon: Serafina Mitchell,  MD;  Location: [place];  Service: Vascular;  Laterality: Bilateral;  . FASCIOTOMY Left 06/27/2017   Procedure: FULL COMPARTMENT LEFT LOWER LEG FASCIOTOMY;  Surgeon: Serafina Mitchell, MD;  Location: [place];  Service: Vascular;  Laterality: Left;  . PATCH ANGIOPLASTY Left 06/27/2017   Procedure: PATCH ANGIOPLASTY Left Posterior Tibial Artery;  Surgeon: Serafina Mitchell, MD;  Location: [place];  Service: Vascular;  Laterality: Left;  . 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 07/11/2017   Procedure: REPAIR OF COMPLEX TRACTION RETINAL DETACHMENT WITH ENDO LASER AND ANTIFUNGAL INJECTION;  Surgeon: Jalene Mullet, MD;  Location: [place];  Service: Ophthalmology;  Laterality: Right;  . TEE WITHOUT CARDIOVERSION N/A  07/11/2017   Procedure: TRANSESOPHAGEAL ECHOCARDIOGRAM (TEE);  Surgeon: Acie Fredrickson Wonda Cheng, MD;  Location[facility] ENDOSCOPY;  Service: Cardiovascular;  Laterality: N/A;   Past Medical History:  Diagnosis Date  . Frequent headaches   . Hypertension   . Muscle pain   . Palpitations   . Sinus problem   . Stroke [facility])    There were no vitals taken for this visit.  Opioid Risk Score:   Fall Risk Score:  `1  Depression screen PHQ 2/9  Depression screen PHQ 2/9 08/29/2017  Decreased Interest 0  Down, Depressed, Hopeless 0  PHQ - 2 Score 0     Review of Systems  Constitutional: Positive for unexpected weight change.  HENT: Negative.   Eyes: Negative.   Respiratory: Negative.   Cardiovascular: Negative.   Gastrointestinal: Negative.   Endocrine: Negative.   Genitourinary: Negative.   Musculoskeletal: Negative.   Skin: Negative.   Allergic/Immunologic: Negative.   Neurological: Negative.   Hematological: Negative.   Psychiatric/Behavioral: Negative.        Objective:   Physical Exam  Constitutional: She appears well-developed. Obese HENT: Normocephalic and atraumatic.  Eyes: EOM are normal. Right eye exhibits no discharge. Left eye exhibits no discharge.  Neck: Normal range of motion. Neck supple.  Cardiovascular:  Regular rate Respiratory: Clear with normal effort t     GI:  BS+, vac in place and sealed. Minimally tender Musculoskeletal: Stump is well formed with 1+ edema still neurological: She is alert.    Speech clear LUE with edema  Able to follow basic commands without difficulty.  Motor: RUE: 5/5 proximal to distal LUE: 0/5 proximal to distal with tone and contracture in wrist and fingers--no changes RLE: HF, KE 4/5, ADF/PF 4/5 (unchanged) LLE: HF 4/5    Skin:    AK incision intact with staples all in place. Psychiatric: Her affect is bright        Assessment & Plan:  1. Decreased functional mobility secondary to left AKA10/06/2017 as well as history of  CVA with left-sided residual weakness.             -therapy to continue at home.  Discussed importance of improving her leg strength and trunk control as well as aerobic capacity as able              -She will be a prosthetic candidate once the limb is ready.  Discussed use of a shrinker.  Gave her a prescription for a left above-knee stump shrinker which she will get filled at [facility].   2. Pain Management/headaches: stop gabapentin ( as she's not taking)   -We will initiate a trial of Topamax 25-50 mg nightly for migraine prophylaxis             - oxycodone prior to therapies 3. Skin/Wound  Care: Made a referral to vascular surgery for staple removal  8.Abdominal abscesses. Status post laparotomy, lysis of adhesions repair perforated bowel 06/19/2017 at [facility] post percutaneous drain placement October 1  -continue vac Surgical follow up                          9.Fungemia with endophthalmitis.follow-up ophthalmology services.status post antibiotic treatment.  Patient has scheduled infectious disease follow-up this week 11. Constipation resolved   I will follow-up with the patient in about 1 month's time.  15 minutes of time was spent directly with the patient in consultation and examination today.

## 2017-08-29 NOTE — Patient Instructions (Signed)
PLEASE FEEL FREE TO CALL OUR OFFICE WITH ANY PROBLEMS OR QUESTIONS ([phone])  HAVE A HAPPY HOLIDAYS!                     ^                  ^^                ^ ^ ^             ^ ^ ^ ^ ^           ^ ^ ^ ^ ^ ^ ^        ^ ^ ^ ^ ^ ^ ^ ^ ^      ^ ^ ^ ^ ^ ^ ^ ^ ^ ^ ^                ^^^^                ^^^^                ^^^^     

## 2017-08-30 DIAGNOSIS — I10 Essential (primary) hypertension: Secondary | ICD-10-CM | POA: Diagnosis not present

## 2017-08-30 DIAGNOSIS — L89153 Pressure ulcer of sacral region, stage 3: Secondary | ICD-10-CM | POA: Diagnosis not present

## 2017-08-30 DIAGNOSIS — L89312 Pressure ulcer of right buttock, stage 2: Secondary | ICD-10-CM | POA: Diagnosis not present

## 2017-08-30 DIAGNOSIS — K5900 Constipation, unspecified: Secondary | ICD-10-CM | POA: Diagnosis not present

## 2017-08-30 DIAGNOSIS — T8140XD Infection following a procedure, unspecified, subsequent encounter: Secondary | ICD-10-CM | POA: Diagnosis not present

## 2017-08-30 DIAGNOSIS — I739 Peripheral vascular disease, unspecified: Secondary | ICD-10-CM | POA: Diagnosis not present

## 2017-08-30 DIAGNOSIS — Z4781 Encounter for orthopedic aftercare following surgical amputation: Secondary | ICD-10-CM | POA: Diagnosis not present

## 2017-08-30 DIAGNOSIS — I69354 Hemiplegia and hemiparesis following cerebral infarction affecting left non-dominant side: Secondary | ICD-10-CM | POA: Diagnosis not present

## 2017-08-30 DIAGNOSIS — Z4800 Encounter for change or removal of nonsurgical wound dressing: Secondary | ICD-10-CM | POA: Diagnosis not present

## 2017-08-30 DIAGNOSIS — Z89612 Acquired absence of left leg above knee: Secondary | ICD-10-CM | POA: Diagnosis not present

## 2017-08-30 DIAGNOSIS — L89322 Pressure ulcer of left buttock, stage 2: Secondary | ICD-10-CM | POA: Diagnosis not present

## 2017-08-30 DIAGNOSIS — Z4801 Encounter for change or removal of surgical wound dressing: Secondary | ICD-10-CM | POA: Diagnosis not present

## 2017-08-31 ENCOUNTER — Ambulatory Visit (INDEPENDENT_AMBULATORY_CARE_PROVIDER_SITE_OTHER): Payer: Medicare Other | Admitting: Infectious Disease

## 2017-08-31 ENCOUNTER — Encounter: Payer: Self-pay | Admitting: Infectious Disease

## 2017-08-31 VITALS — BP 121/77 | HR 86 | Temp 98.9°F

## 2017-08-31 DIAGNOSIS — Z4800 Encounter for change or removal of nonsurgical wound dressing: Secondary | ICD-10-CM | POA: Diagnosis not present

## 2017-08-31 DIAGNOSIS — B377 Candidal sepsis: Secondary | ICD-10-CM

## 2017-08-31 DIAGNOSIS — H4419 Other endophthalmitis: Secondary | ICD-10-CM | POA: Diagnosis not present

## 2017-08-31 DIAGNOSIS — I809 Phlebitis and thrombophlebitis of unspecified site: Secondary | ICD-10-CM

## 2017-08-31 DIAGNOSIS — N739 Female pelvic inflammatory disease, unspecified: Secondary | ICD-10-CM | POA: Diagnosis not present

## 2017-08-31 DIAGNOSIS — I998 Other disorder of circulatory system: Secondary | ICD-10-CM

## 2017-08-31 DIAGNOSIS — I70229 Atherosclerosis of native arteries of extremities with rest pain, unspecified extremity: Secondary | ICD-10-CM

## 2017-08-31 DIAGNOSIS — R188 Other ascites: Secondary | ICD-10-CM | POA: Diagnosis not present

## 2017-08-31 DIAGNOSIS — Z4801 Encounter for change or removal of surgical wound dressing: Secondary | ICD-10-CM | POA: Diagnosis not present

## 2017-08-31 DIAGNOSIS — Z4781 Encounter for orthopedic aftercare following surgical amputation: Secondary | ICD-10-CM | POA: Diagnosis not present

## 2017-08-31 DIAGNOSIS — T8140XD Infection following a procedure, unspecified, subsequent encounter: Secondary | ICD-10-CM | POA: Diagnosis not present

## 2017-08-31 DIAGNOSIS — I6930 Unspecified sequelae of cerebral infarction: Secondary | ICD-10-CM | POA: Diagnosis not present

## 2017-08-31 DIAGNOSIS — I739 Peripheral vascular disease, unspecified: Secondary | ICD-10-CM

## 2017-08-31 DIAGNOSIS — K5900 Constipation, unspecified: Secondary | ICD-10-CM | POA: Diagnosis not present

## 2017-08-31 DIAGNOSIS — S78112A Complete traumatic amputation at level between left hip and knee, initial encounter: Secondary | ICD-10-CM

## 2017-08-31 DIAGNOSIS — Z89612 Acquired absence of left leg above knee: Secondary | ICD-10-CM

## 2017-08-31 DIAGNOSIS — B379 Candidiasis, unspecified: Secondary | ICD-10-CM | POA: Insufficient documentation

## 2017-08-31 DIAGNOSIS — L89312 Pressure ulcer of right buttock, stage 2: Secondary | ICD-10-CM | POA: Diagnosis not present

## 2017-08-31 DIAGNOSIS — R7881 Bacteremia: Secondary | ICD-10-CM | POA: Diagnosis not present

## 2017-08-31 DIAGNOSIS — K56609 Unspecified intestinal obstruction, unspecified as to partial versus complete obstruction: Secondary | ICD-10-CM | POA: Diagnosis not present

## 2017-08-31 DIAGNOSIS — I10 Essential (primary) hypertension: Secondary | ICD-10-CM | POA: Diagnosis not present

## 2017-08-31 DIAGNOSIS — I69354 Hemiplegia and hemiparesis following cerebral infarction affecting left non-dominant side: Secondary | ICD-10-CM | POA: Diagnosis not present

## 2017-08-31 DIAGNOSIS — B49 Unspecified mycosis: Secondary | ICD-10-CM | POA: Diagnosis not present

## 2017-08-31 DIAGNOSIS — L89153 Pressure ulcer of sacral region, stage 3: Secondary | ICD-10-CM | POA: Diagnosis not present

## 2017-08-31 DIAGNOSIS — L89322 Pressure ulcer of left buttock, stage 2: Secondary | ICD-10-CM | POA: Diagnosis not present

## 2017-08-31 HISTORY — DX: Phlebitis and thrombophlebitis of unspecified site: I80.9

## 2017-08-31 LAB — COMPLETE METABOLIC PANEL WITH GFR
AG Ratio: 0.9 (calc) — ABNORMAL LOW (ref 1.0–2.5)
ALBUMIN MSPROF: 3.3 g/dL — AB (ref 3.6–5.1)
ALKALINE PHOSPHATASE (APISO): 100 U/L (ref 33–130)
ALT: 11 U/L (ref 6–29)
AST: 21 U/L (ref 10–35)
BUN/Creatinine Ratio: 17 (calc) (ref 6–22)
BUN: 8 mg/dL (ref 7–25)
CALCIUM: 9.6 mg/dL (ref 8.6–10.4)
CO2: 25 mmol/L (ref 20–32)
CREATININE: 0.47 mg/dL — AB (ref 0.50–0.99)
Chloride: 100 mmol/L (ref 98–110)
GFR, Est African American: 118 mL/min/{1.73_m2} (ref >=60)
GFR, Est Non African American: 101 mL/min/{1.73_m2} (ref >=60)
GLOBULIN: 3.8 g/dL — AB (ref 1.9–3.7)
GLUCOSE: 103 mg/dL — AB (ref 65–99)
Potassium: 3.6 mmol/L (ref 3.5–5.3)
Sodium: 137 mmol/L (ref 135–146)
Total Bilirubin: 0.4 mg/dL (ref 0.2–1.2)
Total Protein: 7.1 g/dL (ref 6.1–8.1)

## 2017-08-31 NOTE — Progress Notes (Signed)
Subjective:    Patient ID: Anna Hayes, female    DOB: 18-May-1951, 66 y.o.   MRN: [id number]  HPI  66 y.o. female with  Hx of SBO, perforation sp ex lap complicated by abscess, fungemia with C tropicalis, Candidal endophthalmitis. She is sp iv voriconazole, who developed retinal detachment  and then underwent  later  Plana Vitrectomy, Membrane Peeling, Endolaser, Fluid Gas Exchange and membranectomy with tractional retinal detachment repair on October 16th, 2018, by Dr. Jalene Mullet. She underwent TEE that was clear of vegetations. She also had clot and was treated with embolectomy and given treatment with high dose fluconazole 800mg  daily x 4 weeks. My colleague Dr. Baxter Flattery had recommended weekly eye exam until lesions improved and minimum formal eye exam before end of 4 weeks. While she had eye exams I do not see that Dr. Posey Pronto or anyone was specifically asked to come back and see her from Ophthalmology. Patient was also supposed to be seen by [place] in ID prior to her antibiotics being completed.  She was also treated with systemic antibiotics changed over to oral abx in form of cefpodoxime and metronidazole and completed 4 additional weeks of these antibiotics.  She had non viable foot despite embolectomy and had to undergo left AKA.  She presents today for follow-up and is accompanied by her daughter.  Past Medical History:  Diagnosis Date  . Frequent headaches   . Hypertension   . Muscle pain   . Palpitations   . Sinus problem   . Stroke [facility])     Past Surgical History:  Procedure Laterality Date  . AMPUTATION Left 07/05/2017   Procedure: LEFT ABOVE KNEE AMPUTATION;  Surgeon: Conrad Avoca, MD;  Location: [place];  Service: Vascular;  Laterality: Left;  . EMBOLECTOMY Bilateral 06/27/2017   Procedure: BILATERAL FEMORAL POPLITEAL EMBOLECTOMY;  Surgeon: Serafina Mitchell, MD;  [facility];  Service: Vascular;  Laterality: Bilateral;  . FASCIOTOMY Left 06/27/2017   Procedure:  FULL COMPARTMENT LEFT LOWER LEG FASCIOTOMY;  Surgeon: Serafina Mitchell, MD;  Location: [place];  Service: Vascular;  Laterality: Left;  . PATCH ANGIOPLASTY Left 06/27/2017   Procedure: PATCH ANGIOPLASTY Left Posterior Tibial Artery;  Surgeon: Serafina Mitchell, MD;  Location: [place];  Service: Vascular;  Laterality: Left;  . 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 07/11/2017   Procedure: REPAIR OF COMPLEX TRACTION RETINAL DETACHMENT WITH ENDO LASER AND ANTIFUNGAL INJECTION;  Surgeon: Jalene Mullet, MD;  Location: [place];  Service: Ophthalmology;  Laterality: Right;  . TEE WITHOUT CARDIOVERSION N/A 07/11/2017   Procedure: TRANSESOPHAGEAL ECHOCARDIOGRAM (TEE);  Surgeon: Acie Fredrickson Wonda Cheng, MD;  Location: [facility] ENDOSCOPY;  Service: Cardiovascular;  Laterality: N/A;    No family history on file.    Social History   Socioeconomic History  . Marital status: Widowed    Spouse name: None  . Number of children: None  . Years of education: None  . Highest education level: None  Social Needs  . Financial resource strain: None  . Food insecurity - worry: None  . Food insecurity - inability: None  . Transportation needs - medical: None  . Transportation needs - non-medical: None  Occupational History  . None  Tobacco Use  . Smoking status: Current Every Day Smoker    Packs/day: 0.50    Types: Cigarettes  . Smokeless tobacco: Never Used  Substance and Sexual Activity  . Alcohol use: No  . Drug use: No  . Sexual activity: None  Other Topics Concern  . None  Social History Narrative  . None    Allergies  Allergen Reactions  . Latex Other (See Comments)    Blisters  . Codeine     UNSPECIFIED REACTION   . Penicillins Hives and Swelling     Current Outpatient Medications:  .  amLODipine (NORVASC) 5 MG tablet, Take 1 tablet (5 mg total) by mouth daily., Disp: 30 tablet, Rfl: 0 .  atenolol (TENORMIN) 50 MG tablet, Take 0.5 tablets (25 mg total) by mouth daily., Disp: 30 tablet,  Rfl: 0 .  collagenase (SANTYL) ointment, Apply topically daily., Disp: 15 g, Rfl: 0 .  dicyclomine (BENTYL) 10 MG capsule, Take 10 mg by mouth 3 (three) times daily before meals. , Disp: , Rfl:  .  furosemide (LASIX) 20 MG tablet, Take 20 mg by mouth daily. , Disp: , Rfl:  .  iron polysaccharides (NIFEREX) 150 MG capsule, Take 1 capsule (150 mg total) by mouth daily., Disp: 30 capsule, Rfl: 0 .  levothyroxine (SYNTHROID, LEVOTHROID) 25 MCG tablet, Take 1 tablet (25 mcg total) by mouth daily., Disp: 30 tablet, Rfl: 0 .  Multiple Vitamin (MULTIVITAMIN WITH MINERALS) TABS tablet, Take 1 tablet by mouth daily., Disp: , Rfl:  .  omeprazole (PRILOSEC) 40 MG capsule, Take 1 capsule (40 mg total) by mouth daily., Disp: 30 capsule, Rfl: 0 .  oxyCODONE-acetaminophen (PERCOCET/ROXICET) 5-325 MG tablet, Take 1-2 tablets by mouth every 4 (four) hours as needed for moderate pain., Disp: 30 tablet, Rfl: 0 .  rivaroxaban (XARELTO) 20 MG TABS tablet, Take 1 tablet (20 mg total) by mouth daily with supper., Disp: 30 tablet, Rfl: 0 .  simvastatin (ZOCOR) 40 MG tablet, Take 40 mg by mouth daily. , Disp: , Rfl:  .  topiramate (TOPAMAX) 25 MG tablet, Take 1-2 tablets (25-50 mg total) by mouth at bedtime., Disp: 60 tablet, Rfl: 2      limb ischemia sp embolectomy and angioplasty     Review of Systems  Constitutional: Negative for activity change, appetite change, chills, diaphoresis, fatigue, fever and unexpected weight change.  HENT: Negative for congestion, rhinorrhea, sinus pressure, sneezing, sore throat and trouble swallowing.   Eyes: Positive for visual disturbance. Negative for photophobia.  Respiratory: Negative for cough, chest tightness, shortness of breath, wheezing and stridor.   Cardiovascular: Negative for chest pain, palpitations and leg swelling.  Gastrointestinal: Negative for abdominal distention, abdominal pain, anal bleeding, blood in stool, constipation, diarrhea, nausea and vomiting.    Genitourinary: Negative for difficulty urinating, dysuria, flank pain and hematuria.  Musculoskeletal: Positive for myalgias. Negative for arthralgias, back pain, gait problem and joint swelling.  Skin: Positive for wound. Negative for color change, pallor and rash.  Neurological: Negative for dizziness, tremors, weakness and light-headedness.  Hematological: Negative for adenopathy. Does not bruise/bleed easily.  Psychiatric/Behavioral: Negative for agitation, behavioral problems, confusion, decreased concentration, dysphoric mood and sleep disturbance.       Objective:   Physical Exam  Constitutional: She is oriented to person, place, and time. She appears well-developed and well-nourished. No distress.  HENT:  Head: Normocephalic and atraumatic.  Mouth/Throat: No oropharyngeal exudate.  Eyes: Conjunctivae and EOM are normal. Pupils are equal, round, and reactive to light. No scleral icterus.  She had no visual field cuts to my exam but could not read with right eye at all  Neck: Normal range of motion. Neck supple.  Cardiovascular: Normal rate and regular rhythm.  Pulmonary/Chest: Effort normal. No respiratory distress. She has no  wheezes.  Abdominal: She exhibits no distension.    Musculoskeletal: She exhibits no edema or tenderness.       Legs: Neurological: She is alert and oriented to person, place, and time. She exhibits normal muscle tone.  Skin: Skin is warm and dry. No rash noted. She is not diaphoretic. No erythema.  Psychiatric: She has a normal mood and affect. Her behavior is normal. Judgment and thought content normal.          Assessment & Plan:    C tropicalis fungemia with septic thromboplebitis and  endophthalmitis sp iv voriconazole, Plana Vitrectomy, Membrane Peeling, Endolaser, Fluid Gas Exchange and membranectomy with tractional retinal detachment repair, sp 4 weeks of high dose fluconazole 800 mg  --check surveillance blood cultures to ensure cure of  fungemia --she needs to see Dr. Jalene Mullet and we called his office to make arrangements for this  Intra-abdominal abscess: she has received sufficient treatment with drainage and antibiotics  Nonviable foot sp AKA: she needs to followp with [facility] to have staples removed   I spent greater than 25 minutes with the patient including greater than 50% of time in face to face counsel of the patient and her daughter re nature of fungemia and fungal endophthalmitis , need for repeat formal eye exam by ophthalmology and in coordination of her  Care with ophthalmology.

## 2017-09-01 ENCOUNTER — Telehealth: Payer: Self-pay | Admitting: Behavioral Health

## 2017-09-01 ENCOUNTER — Ambulatory Visit: Payer: Medicare Other | Admitting: Vascular Surgery

## 2017-09-01 ENCOUNTER — Encounter: Payer: Self-pay | Admitting: Behavioral Health

## 2017-09-01 DIAGNOSIS — L89153 Pressure ulcer of sacral region, stage 3: Secondary | ICD-10-CM | POA: Diagnosis not present

## 2017-09-01 DIAGNOSIS — I10 Essential (primary) hypertension: Secondary | ICD-10-CM | POA: Diagnosis not present

## 2017-09-01 DIAGNOSIS — T8140XD Infection following a procedure, unspecified, subsequent encounter: Secondary | ICD-10-CM | POA: Diagnosis not present

## 2017-09-01 DIAGNOSIS — L89322 Pressure ulcer of left buttock, stage 2: Secondary | ICD-10-CM | POA: Diagnosis not present

## 2017-09-01 DIAGNOSIS — I69354 Hemiplegia and hemiparesis following cerebral infarction affecting left non-dominant side: Secondary | ICD-10-CM | POA: Diagnosis not present

## 2017-09-01 DIAGNOSIS — Z4801 Encounter for change or removal of surgical wound dressing: Secondary | ICD-10-CM | POA: Diagnosis not present

## 2017-09-01 DIAGNOSIS — Z89612 Acquired absence of left leg above knee: Secondary | ICD-10-CM | POA: Diagnosis not present

## 2017-09-01 DIAGNOSIS — Z4781 Encounter for orthopedic aftercare following surgical amputation: Secondary | ICD-10-CM | POA: Diagnosis not present

## 2017-09-01 DIAGNOSIS — L89312 Pressure ulcer of right buttock, stage 2: Secondary | ICD-10-CM | POA: Diagnosis not present

## 2017-09-01 DIAGNOSIS — Z4800 Encounter for change or removal of nonsurgical wound dressing: Secondary | ICD-10-CM | POA: Diagnosis not present

## 2017-09-01 DIAGNOSIS — I739 Peripheral vascular disease, unspecified: Secondary | ICD-10-CM | POA: Diagnosis not present

## 2017-09-01 DIAGNOSIS — K5900 Constipation, unspecified: Secondary | ICD-10-CM | POA: Diagnosis not present

## 2017-09-01 LAB — DRUG TOX MONITOR 1 W/CONF, ORAL FLD
AMPHETAMINES: NEGATIVE ng/mL (ref <10)
BENZODIAZEPINES: NEGATIVE ng/mL (ref <0.50)
Barbiturates: NEGATIVE ng/mL (ref <10)
Buprenorphine: NEGATIVE ng/mL (ref <0.10)
COTININE: 139.3 ng/mL — AB (ref <5.0)
Cocaine: NEGATIVE ng/mL (ref <5.0)
Fentanyl: NEGATIVE ng/mL (ref <0.10)
Heroin Metabolite: NEGATIVE ng/mL (ref <1.0)
MARIJUANA: NEGATIVE ng/mL (ref <2.5)
MDMA: NEGATIVE ng/mL (ref <10)
Meprobamate: NEGATIVE ng/mL (ref <2.5)
Methadone: NEGATIVE ng/mL (ref <5.0)
Nicotine Metabolite: POSITIVE ng/mL — AB (ref <5.0)
OPIATES: NEGATIVE ng/mL (ref <2.5)
Phencyclidine: NEGATIVE ng/mL (ref <10)
TAPENTADOL: NEGATIVE ng/mL (ref <5.0)
TRAMADOL: NEGATIVE ng/mL (ref <5.0)
Zolpidem: NEGATIVE ng/mL (ref <5.0)

## 2017-09-01 LAB — DRUG TOX ALC METAB W/CON, ORAL FLD: ALCOHOL METABOLITE: NEGATIVE ng/mL (ref <25)

## 2017-09-01 NOTE — Telephone Encounter (Signed)
Writer spoke with [place] at Dr. Serita Grit office.  She stated she will talk to him to see about follow up appointment for patient.  [place] stated they will call the patient for a follow up appointment and call [facility] to let us know the appointment was scheduled.

## 2017-09-05 DIAGNOSIS — I739 Peripheral vascular disease, unspecified: Secondary | ICD-10-CM | POA: Diagnosis not present

## 2017-09-05 DIAGNOSIS — T8140XD Infection following a procedure, unspecified, subsequent encounter: Secondary | ICD-10-CM | POA: Diagnosis not present

## 2017-09-05 DIAGNOSIS — K5900 Constipation, unspecified: Secondary | ICD-10-CM | POA: Diagnosis not present

## 2017-09-05 DIAGNOSIS — L89322 Pressure ulcer of left buttock, stage 2: Secondary | ICD-10-CM | POA: Diagnosis not present

## 2017-09-05 DIAGNOSIS — Z4800 Encounter for change or removal of nonsurgical wound dressing: Secondary | ICD-10-CM | POA: Diagnosis not present

## 2017-09-05 DIAGNOSIS — Z89612 Acquired absence of left leg above knee: Secondary | ICD-10-CM | POA: Diagnosis not present

## 2017-09-05 DIAGNOSIS — I69354 Hemiplegia and hemiparesis following cerebral infarction affecting left non-dominant side: Secondary | ICD-10-CM | POA: Diagnosis not present

## 2017-09-05 DIAGNOSIS — Z4781 Encounter for orthopedic aftercare following surgical amputation: Secondary | ICD-10-CM | POA: Diagnosis not present

## 2017-09-05 DIAGNOSIS — I10 Essential (primary) hypertension: Secondary | ICD-10-CM | POA: Diagnosis not present

## 2017-09-05 DIAGNOSIS — L89312 Pressure ulcer of right buttock, stage 2: Secondary | ICD-10-CM | POA: Diagnosis not present

## 2017-09-05 DIAGNOSIS — Z4801 Encounter for change or removal of surgical wound dressing: Secondary | ICD-10-CM | POA: Diagnosis not present

## 2017-09-05 DIAGNOSIS — L89153 Pressure ulcer of sacral region, stage 3: Secondary | ICD-10-CM | POA: Diagnosis not present

## 2017-09-06 ENCOUNTER — Telehealth: Payer: Self-pay

## 2017-09-06 LAB — CULTURE, BLOOD (SINGLE)
MICRO NUMBER: 81373753
MICRO NUMBER:: 81373749
Result:: NO GROWTH
SPECIMEN QUALITY:: ADEQUATE

## 2017-09-06 NOTE — Telephone Encounter (Signed)
Called Anna Hayes back and relayed this information.

## 2017-09-06 NOTE — Telephone Encounter (Signed)
Verbally consulted with Dr. Naaman Plummer, he stated that this information should in actuality be addressed with the operating surgeon thou he does state that it should be ok to wait until Friday for the changing.

## 2017-09-06 NOTE — Telephone Encounter (Addendum)
Terry RN [facility] called, stated due to inclement weather they were unable to have her wound vac changed until Tuesday when normally she is on a MWF schedule, wants to know if it needs to be changed again today or can it wait until Friday?  Also according to her the wound is SIGNIFICANTLY improved and wants to know also if it continues to need changing since the patients appointment is in January.  Please advise.

## 2017-09-08 ENCOUNTER — Ambulatory Visit (INDEPENDENT_AMBULATORY_CARE_PROVIDER_SITE_OTHER): Payer: Medicare Other | Admitting: Family

## 2017-09-08 ENCOUNTER — Encounter: Payer: Self-pay | Admitting: Family

## 2017-09-08 VITALS — BP 114/81 | HR 83 | Temp 98.4°F | Resp 18

## 2017-09-08 DIAGNOSIS — L89322 Pressure ulcer of left buttock, stage 2: Secondary | ICD-10-CM | POA: Diagnosis not present

## 2017-09-08 DIAGNOSIS — I69354 Hemiplegia and hemiparesis following cerebral infarction affecting left non-dominant side: Secondary | ICD-10-CM | POA: Diagnosis not present

## 2017-09-08 DIAGNOSIS — L89153 Pressure ulcer of sacral region, stage 3: Secondary | ICD-10-CM | POA: Diagnosis not present

## 2017-09-08 DIAGNOSIS — Z89612 Acquired absence of left leg above knee: Secondary | ICD-10-CM | POA: Diagnosis not present

## 2017-09-08 DIAGNOSIS — F17200 Nicotine dependence, unspecified, uncomplicated: Secondary | ICD-10-CM

## 2017-09-08 DIAGNOSIS — K5900 Constipation, unspecified: Secondary | ICD-10-CM | POA: Diagnosis not present

## 2017-09-08 DIAGNOSIS — I739 Peripheral vascular disease, unspecified: Secondary | ICD-10-CM | POA: Diagnosis not present

## 2017-09-08 DIAGNOSIS — Z4781 Encounter for orthopedic aftercare following surgical amputation: Secondary | ICD-10-CM | POA: Diagnosis not present

## 2017-09-08 DIAGNOSIS — I779 Disorder of arteries and arterioles, unspecified: Secondary | ICD-10-CM

## 2017-09-08 DIAGNOSIS — Z4801 Encounter for change or removal of surgical wound dressing: Secondary | ICD-10-CM | POA: Diagnosis not present

## 2017-09-08 DIAGNOSIS — T8140XD Infection following a procedure, unspecified, subsequent encounter: Secondary | ICD-10-CM | POA: Diagnosis not present

## 2017-09-08 DIAGNOSIS — I10 Essential (primary) hypertension: Secondary | ICD-10-CM | POA: Diagnosis not present

## 2017-09-08 DIAGNOSIS — L89312 Pressure ulcer of right buttock, stage 2: Secondary | ICD-10-CM | POA: Diagnosis not present

## 2017-09-08 DIAGNOSIS — Z4800 Encounter for change or removal of nonsurgical wound dressing: Secondary | ICD-10-CM | POA: Diagnosis not present

## 2017-09-08 NOTE — Progress Notes (Signed)
Postoperative Visit   History of Present Illness  Anna Hayes is a 66 y.o. female who is s/p left above-the-knee amputation and placement of negative pressure dressing on incision on 07-05-17 by Dr. Bridgett Larsson for left foot gangrene.  She is also s/p Bilateral common femoral artery exposure                         #2:  Bilateral popliteal artery exposure                         #3:  Left Posterior tibial artery exposure                         #4:  Thrombectomy of right CFA, PFA, CFA, popliteal, anterior and posterior tibial vessels                         #5:  Thrombectomy of left CFA, PFA, SFA, popliteal, anter tibial, and posterior tibial arteries                         #6:  Vein patch angioplasty of left posterior tibial artery                         #7:  4 compartment left leg fasciotomy                         #8:  Wound vac placement On 06-27-17 by Dr. Trula Slade for bilateral lower extremity thrombus with ischemia to the left leg. The patient was transferred from [facility] that morning when she was examined and found to have a mottled ischemic left foot.  The patient had a CT angiogram the day before which showed bilateral femoral thrombus as well as bilateral popliteal occlusion.  The patient was examined when she got here and taken emergently to the operating room.  She returns today for post operative check and staples removal.   She also has a hx of SBO with pelvic abscess, surgery to address this was possibly at [facility] since pt lives in [place]. A wound vac in in place caudal to her umbilicus. Infectious Disease has been treating her. Excerpt from Dr. Derek Mound HPI on 08-31-17: " SBO, perforation sp ex lap complicated by abscess, fungemia with C tropicalis, Candidal endophthalmitis. She is sp iv voriconazole, who developed retinal detachment  and then underwent  later  Plana Vitrectomy, Membrane Peeling, Endolaser, Fluid Gas Exchange andmembranectomy with  tractional retinal detachment repair on October 16th, 2018, by Dr. Jalene Mullet".   Her daughter is with her.   The patient's Left AKA incision is healed.  The patient notes pain is well controlled.    For VQI Use Only  PRE-ADM LIVING: Home  AMB STATUS: Wheelchair   Past Medical History:  Diagnosis Date  . Frequent headaches   . Hypertension   . Muscle pain   . Palpitations   . Septic thrombophlebitis 08/31/2017  . Sinus problem   . Stroke [facility])     Past Surgical History:  Procedure Laterality Date  . AMPUTATION Left 07/05/2017   Procedure: LEFT ABOVE KNEE AMPUTATION;  Surgeon: Conrad Alburnett, MD;  Location: [place];  Service: Vascular;  Laterality: Left;  . EMBOLECTOMY Bilateral 06/27/2017   Procedure:  BILATERAL FEMORAL POPLITEAL EMBOLECTOMY;  Surgeon: Serafina Mitchell, MD;  Location: [facility] OR;  Service: Vascular;  Laterality: Bilateral;  . FASCIOTOMY Left 06/27/2017   Procedure: FULL COMPARTMENT LEFT LOWER LEG FASCIOTOMY;  Surgeon: Serafina Mitchell, MD;  Location: [place];  Service: Vascular;  Laterality: Left;  . PATCH ANGIOPLASTY Left 06/27/2017   Procedure: PATCH ANGIOPLASTY Left Posterior Tibial Artery;  Surgeon: Serafina Mitchell, MD;  Location: [place];  Service: Vascular;  Laterality: Left;  . 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 07/11/2017   Procedure: REPAIR OF COMPLEX TRACTION RETINAL DETACHMENT WITH ENDO LASER AND ANTIFUNGAL INJECTION;  Surgeon: Jalene Mullet, MD;  Location: [place];  Service: Ophthalmology;  Laterality: Right;  . TEE WITHOUT CARDIOVERSION N/A 07/11/2017   Procedure: TRANSESOPHAGEAL ECHOCARDIOGRAM (TEE);  Surgeon: Acie Fredrickson Wonda Cheng, MD;  Location: [facility] ENDOSCOPY;  Service: Cardiovascular;  Laterality: N/A;    Social History   Socioeconomic History  . Marital status: Widowed    Spouse name: Not on file  . Number of children: Not on file  . Years of education: Not on file  . Highest education level: Not on file  Social Needs  . Financial  resource strain: Not on file  . Food insecurity - worry: Not on file  . Food insecurity - inability: Not on file  . Transportation needs - medical: Not on file  . Transportation needs - non-medical: Not on file  Occupational History  . Not on file  Tobacco Use  . Smoking status: Current Every Day Smoker    Packs/day: 0.50    Types: Cigarettes  . Smokeless tobacco: Never Used  Substance and Sexual Activity  . Alcohol use: No  . Drug use: No  . Sexual activity: Not on file  Other Topics Concern  . Not on file  Social History Narrative  . Not on file    Allergies  Allergen Reactions  . Latex Other (See Comments)    Blisters  . Codeine     UNSPECIFIED REACTION   . Penicillins Hives and Swelling    Current Outpatient Medications on File Prior to Visit  Medication Sig Dispense Refill  . amLODipine (NORVASC) 5 MG tablet Take 1 tablet (5 mg total) by mouth daily. 30 tablet 0  . atenolol (TENORMIN) 50 MG tablet Take 0.5 tablets (25 mg total) by mouth daily. 30 tablet 0  . collagenase (SANTYL) ointment Apply topically daily. 15 g 0  . dicyclomine (BENTYL) 10 MG capsule Take 10 mg by mouth 3 (three) times daily before meals.     . furosemide (LASIX) 20 MG tablet Take 20 mg by mouth daily.     . iron polysaccharides (NIFEREX) 150 MG capsule Take 1 capsule (150 mg total) by mouth daily. 30 capsule 0  . levothyroxine (SYNTHROID, LEVOTHROID) 25 MCG tablet Take 1 tablet (25 mcg total) by mouth daily. 30 tablet 0  . Multiple Vitamin (MULTIVITAMIN WITH MINERALS) TABS tablet Take 1 tablet by mouth daily.    Marland Kitchen omeprazole (PRILOSEC) 40 MG capsule Take 1 capsule (40 mg total) by mouth daily. 30 capsule 0  . oxyCODONE-acetaminophen (PERCOCET/ROXICET) 5-325 MG tablet Take 1-2 tablets by mouth every 4 (four) hours as needed for moderate pain. 30 tablet 0  . rivaroxaban (XARELTO) 20 MG TABS tablet Take 1 tablet (20 mg total) by mouth daily with supper. 30 tablet 0  . simvastatin (ZOCOR) 40 MG  tablet Take 40 mg by mouth daily.     Marland Kitchen topiramate (TOPAMAX) 25 MG tablet  Take 1-2 tablets (25-50 mg total) by mouth at bedtime. 60 tablet 2   No current facility-administered medications on file prior to visit.       Physical Examination  Vitals:   09/08/17 1232 09/08/17 1235  BP: 122/74 114/81  Pulse: 83   Resp: 18   Temp: 98.4 F (36.9 C)   TempSrc: Oral   SpO2: 98%    There is no height or weight on file to calculate BMI.  LLE: Incision is healed.  Staples are intact.  Medical Decision Making  Raegan Winders Lorensen is a 66 y.o. female who presents s/p left above-the-knee amputation on 07-05-17 by Dr. Bridgett Larsson for left foot gangrene. She is also s/p thrombectomy of right CFA, PFA, CFA, popliteal, anterior and posterior tibial vessels and thrombectomy of left CFA, PFA, SFA, popliteal, anter tibial, and posterior tibial arteries on 06-27-17 by Dr. Trula Slade for bilateral lower extremity thrombus with ischemia to the left leg.   All staples removed from left AKA stump.  .  The patient's stump is healing appropriately with resolution of pre-operative symptoms.  Follow up in 3 months to evaluate arterial status of right leg with right LE arterial duplex and right ABI.    Pt has a prescription for [employer] prosthetics to work with her on obtaining a left AKA prosthesis.   The patient was counseled re smoking cessation and given several free resources re smoking cessation.   I advised pt and her daughter to notify us if pt develops concerns re the circulation in her right foot or leg, or if concerns arise re her left AKA stump.   I discussed in depth with the patient the nature of atherosclerosis, and emphasized the importance of maximal medical management including strict control of blood pressure, blood glucose, and lipid levels, obtaining regular exercise, and cessation of smoking.  The patient is aware that without maximal medical management the underlying atherosclerotic disease  process will progress, possibly leading to a more proximal amputation. The patient agrees to participate in their maximal medical care.  Clemon Chambers, RN, MSN, FNP-C [facility] Office: [phone]  09/08/2017, 12:46 PM  Clinic MD: Donzetta Matters

## 2017-09-08 NOTE — Patient Instructions (Addendum)
Steps to Quit Smoking Smoking tobacco can be bad for your health. It can also affect almost every organ in your body. Smoking puts you and people around you at risk for many serious long-lasting (chronic) diseases. Quitting smoking is hard, but it is one of the best things that you can do for your health. It is never too late to quit. What are the benefits of quitting smoking? When you quit smoking, you lower your risk for getting serious diseases and conditions. They can include:  Lung cancer or lung disease.  Heart disease.  Stroke.  Heart attack.  Not being able to have children (infertility).  Weak bones (osteoporosis) and broken bones (fractures).  If you have coughing, wheezing, and shortness of breath, those symptoms may get better when you quit. You may also get sick less often. If you are pregnant, quitting smoking can help to lower your chances of having a baby of low birth weight. What can I do to help me quit smoking? Talk with your doctor about what can help you quit smoking. Some things you can do (strategies) include:  Quitting smoking totally, instead of slowly cutting back how much you smoke over a period of time.  Going to in-person counseling. You are more likely to quit if you go to many counseling sessions.  Using resources and support systems, such as: ? Online chats with a counselor. ? Phone quitlines. ? Printed self-help materials. ? Support groups or group counseling. ? Text messaging programs. ? Mobile phone apps or applications.  Taking medicines. Some of these medicines may have nicotine in them. If you are pregnant or breastfeeding, do not take any medicines to quit smoking unless your doctor says it is okay. Talk with your doctor about counseling or other things that can help you.  Talk with your doctor about using more than one strategy at the same time, such as taking medicines while you are also going to in-person counseling. This can help make  quitting easier. What things can I do to make it easier to quit? Quitting smoking might feel very hard at first, but there is a lot that you can do to make it easier. Take these steps:  Talk to your family and friends. Ask them to support and encourage you.  Call phone quitlines, reach out to support groups, or work with a counselor.  Ask people who smoke to not smoke around you.  Avoid places that make you want (trigger) to smoke, such as: ? Bars. ? Parties. ? Smoke-break areas at work.  Spend time with people who do not smoke.  Lower the stress in your life. Stress can make you want to smoke. Try these things to help your stress: ? Getting regular exercise. ? Deep-breathing exercises. ? Yoga. ? Meditating. ? Doing a body scan. To do this, close your eyes, focus on one area of your body at a time from head to toe, and notice which parts of your body are tense. Try to relax the muscles in those areas.  Download or buy apps on your mobile phone or tablet that can help you stick to your quit plan. There are many free apps, such as QuitGuide from the CDC (Centers for Disease Control and Prevention). You can find more support from smokefree.gov and other websites.  This information is not intended to replace advice given to you by your health care provider. Make sure you discuss any questions you have with your health care provider. Document Released: 07/09/2009 Document   Revised: 05/10/2016 Document Reviewed: 01/27/2015 Elsevier Interactive Patient Education  2018 Elsevier Inc.     Peripheral Vascular Disease Peripheral vascular disease (PVD) is a disease of the blood vessels that are not part of your heart and brain. A simple term for PVD is poor circulation. In most cases, PVD narrows the blood vessels that carry blood from your heart to the rest of your body. This can result in a decreased supply of blood to your arms, legs, and internal organs, like your stomach or kidneys.  However, it most often affects a person's lower legs and feet. There are two types of PVD.  Organic PVD. This is the more common type. It is caused by damage to the structure of blood vessels.  Functional PVD. This is caused by conditions that make blood vessels contract and tighten (spasm).  Without treatment, PVD tends to get worse over time. PVD can also lead to acute ischemic limb. This is when an arm or limb suddenly has trouble getting enough blood. This is a medical emergency. Follow these instructions at home:  Take medicines only as told by your doctor.  Do not use any tobacco products, including cigarettes, chewing tobacco, or electronic cigarettes. If you need help quitting, ask your doctor.  Lose weight if you are overweight, and maintain a healthy weight as told by your doctor.  Eat a diet that is low in fat and cholesterol. If you need help, ask your doctor.  Exercise regularly. Ask your doctor for some good activities for you.  Take good care of your feet. ? Wear comfortable shoes that fit well. ? Check your feet often for any cuts or sores. Contact a doctor if:  You have cramps in your legs while walking.  You have leg pain when you are at rest.  You have coldness in a leg or foot.  Your skin changes.  You are unable to get or have an erection (erectile dysfunction).  You have cuts or sores on your feet that are not healing. Get help right away if:  Your arm or leg turns cold and blue.  Your arms or legs become red, warm, swollen, painful, or numb.  You have chest pain or trouble breathing.  You suddenly have weakness in your face, arm, or leg.  You become very confused or you cannot speak.  You suddenly have a very bad headache.  You suddenly cannot see. This information is not intended to replace advice given to you by your health care provider. Make sure you discuss any questions you have with your health care provider. Document Released:  12/07/2009 Document Revised: 02/18/2016 Document Reviewed: 02/20/2014 Elsevier Interactive Patient Education  2017 Elsevier Inc.  

## 2017-09-10 ENCOUNTER — Encounter: Payer: Self-pay | Admitting: Family

## 2017-09-11 ENCOUNTER — Telehealth: Payer: Self-pay | Admitting: *Deleted

## 2017-09-11 DIAGNOSIS — Z4781 Encounter for orthopedic aftercare following surgical amputation: Secondary | ICD-10-CM | POA: Diagnosis not present

## 2017-09-11 DIAGNOSIS — L89153 Pressure ulcer of sacral region, stage 3: Secondary | ICD-10-CM | POA: Diagnosis not present

## 2017-09-11 DIAGNOSIS — I739 Peripheral vascular disease, unspecified: Secondary | ICD-10-CM | POA: Diagnosis not present

## 2017-09-11 DIAGNOSIS — K5900 Constipation, unspecified: Secondary | ICD-10-CM | POA: Diagnosis not present

## 2017-09-11 DIAGNOSIS — Z4801 Encounter for change or removal of surgical wound dressing: Secondary | ICD-10-CM | POA: Diagnosis not present

## 2017-09-11 DIAGNOSIS — I10 Essential (primary) hypertension: Secondary | ICD-10-CM | POA: Diagnosis not present

## 2017-09-11 DIAGNOSIS — Z4800 Encounter for change or removal of nonsurgical wound dressing: Secondary | ICD-10-CM | POA: Diagnosis not present

## 2017-09-11 DIAGNOSIS — T8140XD Infection following a procedure, unspecified, subsequent encounter: Secondary | ICD-10-CM | POA: Diagnosis not present

## 2017-09-11 DIAGNOSIS — L89322 Pressure ulcer of left buttock, stage 2: Secondary | ICD-10-CM | POA: Diagnosis not present

## 2017-09-11 DIAGNOSIS — Z89612 Acquired absence of left leg above knee: Secondary | ICD-10-CM | POA: Diagnosis not present

## 2017-09-11 DIAGNOSIS — L89312 Pressure ulcer of right buttock, stage 2: Secondary | ICD-10-CM | POA: Diagnosis not present

## 2017-09-11 DIAGNOSIS — I69354 Hemiplegia and hemiparesis following cerebral infarction affecting left non-dominant side: Secondary | ICD-10-CM | POA: Diagnosis not present

## 2017-09-11 NOTE — Telephone Encounter (Signed)
Oral swab drug screen was negative for prescribed medications. Unable to determine if this is expected or not as not documentation of last dose taken at the 08/29/17 appt.

## 2017-09-13 DIAGNOSIS — L89322 Pressure ulcer of left buttock, stage 2: Secondary | ICD-10-CM | POA: Diagnosis not present

## 2017-09-13 DIAGNOSIS — Z4781 Encounter for orthopedic aftercare following surgical amputation: Secondary | ICD-10-CM | POA: Diagnosis not present

## 2017-09-13 DIAGNOSIS — I69354 Hemiplegia and hemiparesis following cerebral infarction affecting left non-dominant side: Secondary | ICD-10-CM | POA: Diagnosis not present

## 2017-09-13 DIAGNOSIS — Z89612 Acquired absence of left leg above knee: Secondary | ICD-10-CM | POA: Diagnosis not present

## 2017-09-13 DIAGNOSIS — Z4800 Encounter for change or removal of nonsurgical wound dressing: Secondary | ICD-10-CM | POA: Diagnosis not present

## 2017-09-13 DIAGNOSIS — L89312 Pressure ulcer of right buttock, stage 2: Secondary | ICD-10-CM | POA: Diagnosis not present

## 2017-09-13 DIAGNOSIS — K5900 Constipation, unspecified: Secondary | ICD-10-CM | POA: Diagnosis not present

## 2017-09-13 DIAGNOSIS — L89153 Pressure ulcer of sacral region, stage 3: Secondary | ICD-10-CM | POA: Diagnosis not present

## 2017-09-13 DIAGNOSIS — I10 Essential (primary) hypertension: Secondary | ICD-10-CM | POA: Diagnosis not present

## 2017-09-13 DIAGNOSIS — I739 Peripheral vascular disease, unspecified: Secondary | ICD-10-CM | POA: Diagnosis not present

## 2017-09-13 DIAGNOSIS — T8140XD Infection following a procedure, unspecified, subsequent encounter: Secondary | ICD-10-CM | POA: Diagnosis not present

## 2017-09-13 DIAGNOSIS — Z4801 Encounter for change or removal of surgical wound dressing: Secondary | ICD-10-CM | POA: Diagnosis not present

## 2017-09-15 DIAGNOSIS — T8140XD Infection following a procedure, unspecified, subsequent encounter: Secondary | ICD-10-CM | POA: Diagnosis not present

## 2017-09-15 DIAGNOSIS — I69354 Hemiplegia and hemiparesis following cerebral infarction affecting left non-dominant side: Secondary | ICD-10-CM | POA: Diagnosis not present

## 2017-09-15 DIAGNOSIS — Z89612 Acquired absence of left leg above knee: Secondary | ICD-10-CM | POA: Diagnosis not present

## 2017-09-15 DIAGNOSIS — L89322 Pressure ulcer of left buttock, stage 2: Secondary | ICD-10-CM | POA: Diagnosis not present

## 2017-09-15 DIAGNOSIS — I739 Peripheral vascular disease, unspecified: Secondary | ICD-10-CM | POA: Diagnosis not present

## 2017-09-15 DIAGNOSIS — Z4801 Encounter for change or removal of surgical wound dressing: Secondary | ICD-10-CM | POA: Diagnosis not present

## 2017-09-15 DIAGNOSIS — Z4781 Encounter for orthopedic aftercare following surgical amputation: Secondary | ICD-10-CM | POA: Diagnosis not present

## 2017-09-15 DIAGNOSIS — I10 Essential (primary) hypertension: Secondary | ICD-10-CM | POA: Diagnosis not present

## 2017-09-15 DIAGNOSIS — K5900 Constipation, unspecified: Secondary | ICD-10-CM | POA: Diagnosis not present

## 2017-09-15 DIAGNOSIS — L89153 Pressure ulcer of sacral region, stage 3: Secondary | ICD-10-CM | POA: Diagnosis not present

## 2017-09-15 DIAGNOSIS — L89312 Pressure ulcer of right buttock, stage 2: Secondary | ICD-10-CM | POA: Diagnosis not present

## 2017-09-15 DIAGNOSIS — Z4800 Encounter for change or removal of nonsurgical wound dressing: Secondary | ICD-10-CM | POA: Diagnosis not present

## 2017-09-18 DIAGNOSIS — L89322 Pressure ulcer of left buttock, stage 2: Secondary | ICD-10-CM | POA: Diagnosis not present

## 2017-09-18 DIAGNOSIS — L89153 Pressure ulcer of sacral region, stage 3: Secondary | ICD-10-CM | POA: Diagnosis not present

## 2017-09-18 DIAGNOSIS — I739 Peripheral vascular disease, unspecified: Secondary | ICD-10-CM | POA: Diagnosis not present

## 2017-09-18 DIAGNOSIS — Z4800 Encounter for change or removal of nonsurgical wound dressing: Secondary | ICD-10-CM | POA: Diagnosis not present

## 2017-09-18 DIAGNOSIS — Z4801 Encounter for change or removal of surgical wound dressing: Secondary | ICD-10-CM | POA: Diagnosis not present

## 2017-09-18 DIAGNOSIS — L89312 Pressure ulcer of right buttock, stage 2: Secondary | ICD-10-CM | POA: Diagnosis not present

## 2017-09-18 DIAGNOSIS — Z89612 Acquired absence of left leg above knee: Secondary | ICD-10-CM | POA: Diagnosis not present

## 2017-09-18 DIAGNOSIS — I10 Essential (primary) hypertension: Secondary | ICD-10-CM | POA: Diagnosis not present

## 2017-09-18 DIAGNOSIS — I69354 Hemiplegia and hemiparesis following cerebral infarction affecting left non-dominant side: Secondary | ICD-10-CM | POA: Diagnosis not present

## 2017-09-18 DIAGNOSIS — K5900 Constipation, unspecified: Secondary | ICD-10-CM | POA: Diagnosis not present

## 2017-09-18 DIAGNOSIS — Z4781 Encounter for orthopedic aftercare following surgical amputation: Secondary | ICD-10-CM | POA: Diagnosis not present

## 2017-09-18 DIAGNOSIS — T8140XD Infection following a procedure, unspecified, subsequent encounter: Secondary | ICD-10-CM | POA: Diagnosis not present

## 2017-09-18 NOTE — Addendum Note (Signed)
Addended by: Lianne Cure A on: 09/18/2017 11:53 AM   Modules accepted: Orders

## 2017-09-20 DIAGNOSIS — Z4781 Encounter for orthopedic aftercare following surgical amputation: Secondary | ICD-10-CM | POA: Diagnosis not present

## 2017-09-20 DIAGNOSIS — I739 Peripheral vascular disease, unspecified: Secondary | ICD-10-CM | POA: Diagnosis not present

## 2017-09-20 DIAGNOSIS — K5900 Constipation, unspecified: Secondary | ICD-10-CM | POA: Diagnosis not present

## 2017-09-20 DIAGNOSIS — L89322 Pressure ulcer of left buttock, stage 2: Secondary | ICD-10-CM | POA: Diagnosis not present

## 2017-09-20 DIAGNOSIS — I69354 Hemiplegia and hemiparesis following cerebral infarction affecting left non-dominant side: Secondary | ICD-10-CM | POA: Diagnosis not present

## 2017-09-20 DIAGNOSIS — Z89612 Acquired absence of left leg above knee: Secondary | ICD-10-CM | POA: Diagnosis not present

## 2017-09-20 DIAGNOSIS — L89312 Pressure ulcer of right buttock, stage 2: Secondary | ICD-10-CM | POA: Diagnosis not present

## 2017-09-20 DIAGNOSIS — Z4801 Encounter for change or removal of surgical wound dressing: Secondary | ICD-10-CM | POA: Diagnosis not present

## 2017-09-20 DIAGNOSIS — T8140XD Infection following a procedure, unspecified, subsequent encounter: Secondary | ICD-10-CM | POA: Diagnosis not present

## 2017-09-20 DIAGNOSIS — Z4800 Encounter for change or removal of nonsurgical wound dressing: Secondary | ICD-10-CM | POA: Diagnosis not present

## 2017-09-20 DIAGNOSIS — L89153 Pressure ulcer of sacral region, stage 3: Secondary | ICD-10-CM | POA: Diagnosis not present

## 2017-09-20 DIAGNOSIS — I10 Essential (primary) hypertension: Secondary | ICD-10-CM | POA: Diagnosis not present

## 2017-09-22 DIAGNOSIS — I69354 Hemiplegia and hemiparesis following cerebral infarction affecting left non-dominant side: Secondary | ICD-10-CM | POA: Diagnosis not present

## 2017-09-22 DIAGNOSIS — L89153 Pressure ulcer of sacral region, stage 3: Secondary | ICD-10-CM | POA: Diagnosis not present

## 2017-09-22 DIAGNOSIS — Z4781 Encounter for orthopedic aftercare following surgical amputation: Secondary | ICD-10-CM | POA: Diagnosis not present

## 2017-09-22 DIAGNOSIS — K5900 Constipation, unspecified: Secondary | ICD-10-CM | POA: Diagnosis not present

## 2017-09-22 DIAGNOSIS — L89312 Pressure ulcer of right buttock, stage 2: Secondary | ICD-10-CM | POA: Diagnosis not present

## 2017-09-22 DIAGNOSIS — Z4800 Encounter for change or removal of nonsurgical wound dressing: Secondary | ICD-10-CM | POA: Diagnosis not present

## 2017-09-22 DIAGNOSIS — Z89612 Acquired absence of left leg above knee: Secondary | ICD-10-CM | POA: Diagnosis not present

## 2017-09-22 DIAGNOSIS — I739 Peripheral vascular disease, unspecified: Secondary | ICD-10-CM | POA: Diagnosis not present

## 2017-09-22 DIAGNOSIS — Z4801 Encounter for change or removal of surgical wound dressing: Secondary | ICD-10-CM | POA: Diagnosis not present

## 2017-09-22 DIAGNOSIS — L89322 Pressure ulcer of left buttock, stage 2: Secondary | ICD-10-CM | POA: Diagnosis not present

## 2017-09-22 DIAGNOSIS — I10 Essential (primary) hypertension: Secondary | ICD-10-CM | POA: Diagnosis not present

## 2017-09-22 DIAGNOSIS — T8140XD Infection following a procedure, unspecified, subsequent encounter: Secondary | ICD-10-CM | POA: Diagnosis not present

## 2017-09-25 DIAGNOSIS — K5900 Constipation, unspecified: Secondary | ICD-10-CM | POA: Diagnosis not present

## 2017-09-25 DIAGNOSIS — L89153 Pressure ulcer of sacral region, stage 3: Secondary | ICD-10-CM | POA: Diagnosis not present

## 2017-09-25 DIAGNOSIS — I69354 Hemiplegia and hemiparesis following cerebral infarction affecting left non-dominant side: Secondary | ICD-10-CM | POA: Diagnosis not present

## 2017-09-25 DIAGNOSIS — Z4781 Encounter for orthopedic aftercare following surgical amputation: Secondary | ICD-10-CM | POA: Diagnosis not present

## 2017-09-25 DIAGNOSIS — T8140XD Infection following a procedure, unspecified, subsequent encounter: Secondary | ICD-10-CM | POA: Diagnosis not present

## 2017-09-25 DIAGNOSIS — Z89612 Acquired absence of left leg above knee: Secondary | ICD-10-CM | POA: Diagnosis not present

## 2017-09-25 DIAGNOSIS — Z4801 Encounter for change or removal of surgical wound dressing: Secondary | ICD-10-CM | POA: Diagnosis not present

## 2017-09-25 DIAGNOSIS — L89312 Pressure ulcer of right buttock, stage 2: Secondary | ICD-10-CM | POA: Diagnosis not present

## 2017-09-25 DIAGNOSIS — T8189XA Other complications of procedures, not elsewhere classified, initial encounter: Secondary | ICD-10-CM | POA: Diagnosis not present

## 2017-09-25 DIAGNOSIS — L89322 Pressure ulcer of left buttock, stage 2: Secondary | ICD-10-CM | POA: Diagnosis not present

## 2017-09-25 DIAGNOSIS — I10 Essential (primary) hypertension: Secondary | ICD-10-CM | POA: Diagnosis not present

## 2017-09-25 DIAGNOSIS — Z4800 Encounter for change or removal of nonsurgical wound dressing: Secondary | ICD-10-CM | POA: Diagnosis not present

## 2017-09-25 DIAGNOSIS — I739 Peripheral vascular disease, unspecified: Secondary | ICD-10-CM | POA: Diagnosis not present

## 2017-09-26 DIAGNOSIS — T8189XA Other complications of procedures, not elsewhere classified, initial encounter: Secondary | ICD-10-CM | POA: Diagnosis not present

## 2017-09-27 ENCOUNTER — Telehealth: Payer: Self-pay | Admitting: *Deleted

## 2017-09-27 DIAGNOSIS — I10 Essential (primary) hypertension: Secondary | ICD-10-CM | POA: Diagnosis not present

## 2017-09-27 DIAGNOSIS — Z4800 Encounter for change or removal of nonsurgical wound dressing: Secondary | ICD-10-CM | POA: Diagnosis not present

## 2017-09-27 DIAGNOSIS — T8140XD Infection following a procedure, unspecified, subsequent encounter: Secondary | ICD-10-CM | POA: Diagnosis not present

## 2017-09-27 DIAGNOSIS — Z89612 Acquired absence of left leg above knee: Secondary | ICD-10-CM | POA: Diagnosis not present

## 2017-09-27 DIAGNOSIS — I69354 Hemiplegia and hemiparesis following cerebral infarction affecting left non-dominant side: Secondary | ICD-10-CM | POA: Diagnosis not present

## 2017-09-27 DIAGNOSIS — I739 Peripheral vascular disease, unspecified: Secondary | ICD-10-CM | POA: Diagnosis not present

## 2017-09-27 DIAGNOSIS — K5900 Constipation, unspecified: Secondary | ICD-10-CM | POA: Diagnosis not present

## 2017-09-27 DIAGNOSIS — L89153 Pressure ulcer of sacral region, stage 3: Secondary | ICD-10-CM | POA: Diagnosis not present

## 2017-09-27 DIAGNOSIS — Z4801 Encounter for change or removal of surgical wound dressing: Secondary | ICD-10-CM | POA: Diagnosis not present

## 2017-09-27 DIAGNOSIS — L89322 Pressure ulcer of left buttock, stage 2: Secondary | ICD-10-CM | POA: Diagnosis not present

## 2017-09-27 DIAGNOSIS — Z4781 Encounter for orthopedic aftercare following surgical amputation: Secondary | ICD-10-CM | POA: Diagnosis not present

## 2017-09-27 DIAGNOSIS — S78112A Complete traumatic amputation at level between left hip and knee, initial encounter: Secondary | ICD-10-CM | POA: Diagnosis not present

## 2017-09-27 DIAGNOSIS — L89312 Pressure ulcer of right buttock, stage 2: Secondary | ICD-10-CM | POA: Diagnosis not present

## 2017-09-27 NOTE — Telephone Encounter (Signed)
She has been being seen by Dr Chin(?sp) the surgeon and she did approach them about the vac but they said they did not order the wound vac---(even though they applied vac in OR). I gave order from Dr Naaman Plummer to d/c vac and start wet to dry normal saline dressing changes.. She will d/c the vac and apply wet to dry saline dressing and daughter will be instructed and able to perform dressing changes.  RN will go weekly to make sure wound is healing.  I reiterated she will need to follow up with someone about the wound, as we are not following her for that reason.

## 2017-09-27 NOTE — Telephone Encounter (Signed)
Keri called to say that the certification period ends today and Anna Hayes has wound vac and they are due to see on Friday.  Per Dr Naaman Plummer we are not managing wound or orders.  I called and spoke with Keri and the issue is the orders were signed by Dr Naaman Plummer with [employer] and their [facility] orders.  The surgeon was only dealing with the stump.  The wound is actually 0.6x0.8x0.2 cm and the wound vac needs to be discontinued to prevent hypergranulation and they are only asking for wet to dry dressing changes.  Please advise.

## 2017-09-27 NOTE — Telephone Encounter (Signed)
There is another set of surgeons following the abdominal wound. Given that I signed the order, I will give the go ahead to stop. Can just apply wet to dry dressing. Ultimately it needs to be seen by someone

## 2017-09-29 DIAGNOSIS — K5900 Constipation, unspecified: Secondary | ICD-10-CM | POA: Diagnosis not present

## 2017-09-29 DIAGNOSIS — I69354 Hemiplegia and hemiparesis following cerebral infarction affecting left non-dominant side: Secondary | ICD-10-CM | POA: Diagnosis not present

## 2017-09-29 DIAGNOSIS — Z4781 Encounter for orthopedic aftercare following surgical amputation: Secondary | ICD-10-CM | POA: Diagnosis not present

## 2017-09-29 DIAGNOSIS — Z89612 Acquired absence of left leg above knee: Secondary | ICD-10-CM | POA: Diagnosis not present

## 2017-09-29 DIAGNOSIS — T8140XD Infection following a procedure, unspecified, subsequent encounter: Secondary | ICD-10-CM | POA: Diagnosis not present

## 2017-09-29 DIAGNOSIS — L89322 Pressure ulcer of left buttock, stage 2: Secondary | ICD-10-CM | POA: Diagnosis not present

## 2017-09-29 DIAGNOSIS — I10 Essential (primary) hypertension: Secondary | ICD-10-CM | POA: Diagnosis not present

## 2017-09-29 DIAGNOSIS — I739 Peripheral vascular disease, unspecified: Secondary | ICD-10-CM | POA: Diagnosis not present

## 2017-09-29 DIAGNOSIS — L89153 Pressure ulcer of sacral region, stage 3: Secondary | ICD-10-CM | POA: Diagnosis not present

## 2017-09-29 DIAGNOSIS — Z4801 Encounter for change or removal of surgical wound dressing: Secondary | ICD-10-CM | POA: Diagnosis not present

## 2017-09-29 DIAGNOSIS — Z4800 Encounter for change or removal of nonsurgical wound dressing: Secondary | ICD-10-CM | POA: Diagnosis not present

## 2017-09-29 DIAGNOSIS — L89312 Pressure ulcer of right buttock, stage 2: Secondary | ICD-10-CM | POA: Diagnosis not present

## 2017-10-02 ENCOUNTER — Encounter: Payer: Self-pay | Admitting: Infectious Disease

## 2017-10-02 ENCOUNTER — Ambulatory Visit (INDEPENDENT_AMBULATORY_CARE_PROVIDER_SITE_OTHER): Payer: Medicare Other | Admitting: Infectious Disease

## 2017-10-02 ENCOUNTER — Telehealth: Payer: Self-pay | Admitting: Behavioral Health

## 2017-10-02 VITALS — BP 113/71 | HR 77 | Temp 98.4°F

## 2017-10-02 DIAGNOSIS — H4419 Other endophthalmitis: Secondary | ICD-10-CM | POA: Diagnosis not present

## 2017-10-02 DIAGNOSIS — B377 Candidal sepsis: Secondary | ICD-10-CM | POA: Diagnosis not present

## 2017-10-02 DIAGNOSIS — I809 Phlebitis and thrombophlebitis of unspecified site: Secondary | ICD-10-CM

## 2017-10-02 DIAGNOSIS — R7881 Bacteremia: Secondary | ICD-10-CM

## 2017-10-02 DIAGNOSIS — I6930 Unspecified sequelae of cerebral infarction: Secondary | ICD-10-CM

## 2017-10-02 DIAGNOSIS — K651 Peritoneal abscess: Secondary | ICD-10-CM

## 2017-10-02 DIAGNOSIS — S78112A Complete traumatic amputation at level between left hip and knee, initial encounter: Secondary | ICD-10-CM

## 2017-10-02 DIAGNOSIS — G5792 Unspecified mononeuropathy of left lower limb: Secondary | ICD-10-CM

## 2017-10-02 DIAGNOSIS — B49 Unspecified mycosis: Secondary | ICD-10-CM

## 2017-10-02 DIAGNOSIS — Z89612 Acquired absence of left leg above knee: Secondary | ICD-10-CM | POA: Diagnosis not present

## 2017-10-02 DIAGNOSIS — I739 Peripheral vascular disease, unspecified: Secondary | ICD-10-CM

## 2017-10-02 DIAGNOSIS — B379 Candidiasis, unspecified: Secondary | ICD-10-CM

## 2017-10-02 DIAGNOSIS — L89153 Pressure ulcer of sacral region, stage 3: Secondary | ICD-10-CM | POA: Diagnosis not present

## 2017-10-02 NOTE — Telephone Encounter (Signed)
Nurse Midg from Dr. Ena Dawley office called stating patient has a follow up appointment with Dr. Posey Pronto 10/26/2017 at 9:30.  Office states they are trying to get in touch with patient to give her that information.

## 2017-10-02 NOTE — Progress Notes (Signed)
Subjective:   Chief complaint: followup for fungal endophthalmitis.   halmitis , intra-abdominal abscess, ischemic foot    Patient ID: Anna Hayes, female    DOB: [birth year], 67 y.o.   MRN: [id number]  HPI  67 y.o. female with  Hx of SBO, perforation sp ex lap complicated by abscess, fungemia with C tropicalis, Candidal endophthalmitis. She is sp iv voriconazole, who developed retinal detachment  and then underwent  later  Plana Vitrectomy, Membrane Peeling, Endolaser, Fluid Gas Exchange and membranectomy with tractional retinal detachment repair on October 16th, 2018, by Dr. Jalene Mullet. She underwent TEE that was clear of vegetations. She also had clot and was treated with embolectomy and given treatment with high dose fluconazole 800mg  daily x 4 weeks. My colleague Dr. Baxter Flattery had recommended weekly eye exam until lesions improved and minimum formal eye exam before end of 4 weeks. While she had eye exams I do not see that Dr. Posey Pronto or anyone was specifically asked to come back and see her from Ophthalmology. Patient was also supposed to be seen by [place] in [place] prior to her antibiotics being completed.  She was also treated with systemic antibiotics changed over to oral abx in form of cefpodoxime and metronidazole and completed 4 additional weeks of these antibiotics.  She had non viable foot despite embolectomy and had to undergo left AKA.  I saw her in followup a month ago. She still needed to have her staples removed by [facility]. We called Dr. Serita Grit office to ensure she had followup with him. Their office ensured [place] that they would be getting in touch with the pt but this has still not happened. She is doing well today and states her vision is better.   Her staples are out. She has  No wound vaccum anymore in abdomen.  Past Medical History:  Diagnosis Date  . Frequent headaches   . Hypertension   . Muscle pain   . Palpitations   . Septic thrombophlebitis 08/31/2017  . Sinus  problem   . Stroke [facility])     Past Surgical History:  Procedure Laterality Date  . AMPUTATION Left 07/05/2017   Procedure: LEFT ABOVE KNEE AMPUTATION;  Surgeon: Conrad Trafford, MD;  Location: [place];  Service: Vascular;  Laterality: Left;  . EMBOLECTOMY Bilateral 06/27/2017   Procedure: BILATERAL FEMORAL POPLITEAL EMBOLECTOMY;  Surgeon: Serafina Mitchell, MD;  [facility];  Service: Vascular;  Laterality: Bilateral;  . FASCIOTOMY Left 06/27/2017   Procedure: FULL COMPARTMENT LEFT LOWER LEG FASCIOTOMY;  Surgeon: Serafina Mitchell, MD;  Location: [place];  Service: Vascular;  Laterality: Left;  . PATCH ANGIOPLASTY Left 06/27/2017   Procedure: PATCH ANGIOPLASTY Left Posterior Tibial Artery;  Surgeon: Serafina Mitchell, MD;  Location: [place];  Service: Vascular;  Laterality: Left;  . 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 07/11/2017   Procedure: REPAIR OF COMPLEX TRACTION RETINAL DETACHMENT WITH ENDO LASER AND ANTIFUNGAL INJECTION;  Surgeon: Jalene Mullet, MD;  Location: [place];  Service: Ophthalmology;  Laterality: Right;  . TEE WITHOUT CARDIOVERSION N/A 07/11/2017   Procedure: TRANSESOPHAGEAL ECHOCARDIOGRAM (TEE);  Surgeon: Acie Fredrickson Wonda Cheng, MD;  Location: [facility] ENDOSCOPY;  Service: Cardiovascular;  Laterality: N/A;    No family history on file.    Social History   Socioeconomic History  . Marital status: Widowed    Spouse name: Not on file  . Number of children: Not on file  . Years of education: Not on file  . Highest education level:  Not on file  Social Needs  . Financial resource strain: Not on file  . Food insecurity - worry: Not on file  . Food insecurity - inability: Not on file  . Transportation needs - medical: Not on file  . Transportation needs - non-medical: Not on file  Occupational History  . Not on file  Tobacco Use  . Smoking status: Current Every Day Smoker    Packs/day: 0.50    Types: Cigarettes  . Smokeless tobacco: Never Used  Substance and Sexual  Activity  . Alcohol use: No  . Drug use: No  . Sexual activity: Not on file  Other Topics Concern  . Not on file  Social History Narrative  . Not on file    Allergies  Allergen Reactions  . Latex Other (See Comments)    Blisters  . Codeine     UNSPECIFIED REACTION   . Penicillins Hives and Swelling     Current Outpatient Medications:  .  amLODipine (NORVASC) 5 MG tablet, Take 1 tablet (5 mg total) by mouth daily., Disp: 30 tablet, Rfl: 0 .  atenolol (TENORMIN) 50 MG tablet, Take 0.5 tablets (25 mg total) by mouth daily., Disp: 30 tablet, Rfl: 0 .  collagenase (SANTYL) ointment, Apply topically daily., Disp: 15 g, Rfl: 0 .  dicyclomine (BENTYL) 10 MG capsule, Take 10 mg by mouth 3 (three) times daily before meals. , Disp: , Rfl:  .  furosemide (LASIX) 20 MG tablet, Take 20 mg by mouth daily. , Disp: , Rfl:  .  iron polysaccharides (NIFEREX) 150 MG capsule, Take 1 capsule (150 mg total) by mouth daily., Disp: 30 capsule, Rfl: 0 .  levothyroxine (SYNTHROID, LEVOTHROID) 25 MCG tablet, Take 1 tablet (25 mcg total) by mouth daily., Disp: 30 tablet, Rfl: 0 .  Multiple Vitamin (MULTIVITAMIN WITH MINERALS) TABS tablet, Take 1 tablet by mouth daily., Disp: , Rfl:  .  omeprazole (PRILOSEC) 40 MG capsule, Take 1 capsule (40 mg total) by mouth daily., Disp: 30 capsule, Rfl: 0 .  oxyCODONE-acetaminophen (PERCOCET/ROXICET) 5-325 MG tablet, Take 1-2 tablets by mouth every 4 (four) hours as needed for moderate pain., Disp: 30 tablet, Rfl: 0 .  rivaroxaban (XARELTO) 20 MG TABS tablet, Take 1 tablet (20 mg total) by mouth daily with supper., Disp: 30 tablet, Rfl: 0 .  simvastatin (ZOCOR) 40 MG tablet, Take 40 mg by mouth daily. , Disp: , Rfl:  .  topiramate (TOPAMAX) 25 MG tablet, Take 1-2 tablets (25-50 mg total) by mouth at bedtime., Disp: 60 tablet, Rfl: 2      limb ischemia sp embolectomy and angioplasty     Review of Systems  Constitutional: Negative for activity change, appetite  change, chills, diaphoresis, fatigue, fever and unexpected weight change.  HENT: Negative for congestion, rhinorrhea, sinus pressure, sneezing, sore throat and trouble swallowing.   Eyes: Positive for visual disturbance. Negative for photophobia.  Respiratory: Negative for cough, chest tightness, shortness of breath, wheezing and stridor.   Cardiovascular: Negative for chest pain, palpitations and leg swelling.  Gastrointestinal: Negative for abdominal distention, abdominal pain, anal bleeding, blood in stool, constipation, diarrhea, nausea and vomiting.  Genitourinary: Negative for difficulty urinating, dysuria, flank pain and hematuria.  Musculoskeletal: Negative for arthralgias, back pain, gait problem and joint swelling.  Skin: Positive for wound. Negative for color change, pallor and rash.  Neurological: Negative for dizziness, tremors, weakness and light-headedness.  Hematological: Negative for adenopathy. Does not bruise/bleed easily.  Psychiatric/Behavioral: Negative for agitation, behavioral problems, confusion, decreased  concentration, dysphoric mood and sleep disturbance.       Objective:   Physical Exam  Constitutional: She is oriented to person, place, and time. She appears well-developed and well-nourished. No distress.  HENT:  Head: Normocephalic and atraumatic.  Mouth/Throat: No oropharyngeal exudate.  Eyes: Conjunctivae and EOM are normal. Pupils are equal, round, and reactive to light. No scleral icterus.  She continues to not have  visual field cuts to my exam but could not read with right eye at all  Neck: Normal range of motion. Neck supple.  Cardiovascular: Normal rate and regular rhythm.  Pulmonary/Chest: Effort normal. No respiratory distress. She has no wheezes.  Abdominal: She exhibits no distension.    Musculoskeletal: She exhibits no edema or tenderness.       Legs: Neurological: She is alert and oriented to person, place, and time. She exhibits normal  muscle tone.  Skin: Skin is warm and dry. No rash noted. She is not diaphoretic. No erythema.  Psychiatric: She has a normal mood and affect. Her behavior is normal. Judgment and thought content normal.  Nursing note and vitals reviewed.  10/02/17:           Assessment & Plan:    C tropicalis fungemia with septic thromboplebitis and  endophthalmitis sp iv voriconazole, Plana Vitrectomy, Membrane Peeling, Endolaser, Fluid Gas Exchange and membranectomy with tractional retinal detachment repair, sp 4 weeks of high dose fluconazole 800 mg  We are reaching out to Opthhalmology again to ensure followup   Intra-abdominal abscess: sp sufficent abx. She has a few sutures here as well. Defer to surgery  Nonviable foot sp AKA: wound is healing up well

## 2017-10-03 ENCOUNTER — Telehealth: Payer: Self-pay | Admitting: Behavioral Health

## 2017-10-03 DIAGNOSIS — Z89612 Acquired absence of left leg above knee: Secondary | ICD-10-CM | POA: Diagnosis not present

## 2017-10-03 DIAGNOSIS — T8140XD Infection following a procedure, unspecified, subsequent encounter: Secondary | ICD-10-CM | POA: Diagnosis not present

## 2017-10-03 NOTE — Telephone Encounter (Signed)
Writer called patient's daughter [name] to inform her of her follow up appointment with Dr. Posey Pronto.  Patient daughter stated Dr. Serita Grit office already called to inform her that her appointment is 10/26/2017.

## 2017-10-04 DIAGNOSIS — I69354 Hemiplegia and hemiparesis following cerebral infarction affecting left non-dominant side: Secondary | ICD-10-CM | POA: Diagnosis not present

## 2017-10-04 DIAGNOSIS — T8140XD Infection following a procedure, unspecified, subsequent encounter: Secondary | ICD-10-CM | POA: Diagnosis not present

## 2017-10-04 DIAGNOSIS — Z89611 Acquired absence of right leg above knee: Secondary | ICD-10-CM | POA: Diagnosis not present

## 2017-10-04 DIAGNOSIS — L89312 Pressure ulcer of right buttock, stage 2: Secondary | ICD-10-CM | POA: Diagnosis not present

## 2017-10-04 DIAGNOSIS — I10 Essential (primary) hypertension: Secondary | ICD-10-CM | POA: Diagnosis not present

## 2017-10-04 DIAGNOSIS — M79661 Pain in right lower leg: Secondary | ICD-10-CM | POA: Diagnosis not present

## 2017-10-04 DIAGNOSIS — Z89612 Acquired absence of left leg above knee: Secondary | ICD-10-CM | POA: Diagnosis not present

## 2017-10-04 DIAGNOSIS — I739 Peripheral vascular disease, unspecified: Secondary | ICD-10-CM | POA: Diagnosis not present

## 2017-10-04 DIAGNOSIS — Z4801 Encounter for change or removal of surgical wound dressing: Secondary | ICD-10-CM | POA: Diagnosis not present

## 2017-10-04 DIAGNOSIS — Z4781 Encounter for orthopedic aftercare following surgical amputation: Secondary | ICD-10-CM | POA: Diagnosis not present

## 2017-10-04 DIAGNOSIS — E876 Hypokalemia: Secondary | ICD-10-CM | POA: Diagnosis not present

## 2017-10-04 DIAGNOSIS — Z4800 Encounter for change or removal of nonsurgical wound dressing: Secondary | ICD-10-CM | POA: Diagnosis not present

## 2017-10-04 DIAGNOSIS — L89322 Pressure ulcer of left buttock, stage 2: Secondary | ICD-10-CM | POA: Diagnosis not present

## 2017-10-04 DIAGNOSIS — K5900 Constipation, unspecified: Secondary | ICD-10-CM | POA: Diagnosis not present

## 2017-10-04 DIAGNOSIS — S78112A Complete traumatic amputation at level between left hip and knee, initial encounter: Secondary | ICD-10-CM | POA: Diagnosis not present

## 2017-10-04 DIAGNOSIS — L89153 Pressure ulcer of sacral region, stage 3: Secondary | ICD-10-CM | POA: Diagnosis not present

## 2017-10-06 DIAGNOSIS — L89312 Pressure ulcer of right buttock, stage 2: Secondary | ICD-10-CM | POA: Diagnosis not present

## 2017-10-06 DIAGNOSIS — Z4801 Encounter for change or removal of surgical wound dressing: Secondary | ICD-10-CM | POA: Diagnosis not present

## 2017-10-06 DIAGNOSIS — Z4781 Encounter for orthopedic aftercare following surgical amputation: Secondary | ICD-10-CM | POA: Diagnosis not present

## 2017-10-06 DIAGNOSIS — I739 Peripheral vascular disease, unspecified: Secondary | ICD-10-CM | POA: Diagnosis not present

## 2017-10-06 DIAGNOSIS — Z4800 Encounter for change or removal of nonsurgical wound dressing: Secondary | ICD-10-CM | POA: Diagnosis not present

## 2017-10-06 DIAGNOSIS — L89322 Pressure ulcer of left buttock, stage 2: Secondary | ICD-10-CM | POA: Diagnosis not present

## 2017-10-06 DIAGNOSIS — I10 Essential (primary) hypertension: Secondary | ICD-10-CM | POA: Diagnosis not present

## 2017-10-06 DIAGNOSIS — K5900 Constipation, unspecified: Secondary | ICD-10-CM | POA: Diagnosis not present

## 2017-10-06 DIAGNOSIS — Z89612 Acquired absence of left leg above knee: Secondary | ICD-10-CM | POA: Diagnosis not present

## 2017-10-06 DIAGNOSIS — T8140XD Infection following a procedure, unspecified, subsequent encounter: Secondary | ICD-10-CM | POA: Diagnosis not present

## 2017-10-06 DIAGNOSIS — I69354 Hemiplegia and hemiparesis following cerebral infarction affecting left non-dominant side: Secondary | ICD-10-CM | POA: Diagnosis not present

## 2017-10-06 DIAGNOSIS — L89153 Pressure ulcer of sacral region, stage 3: Secondary | ICD-10-CM | POA: Diagnosis not present

## 2017-10-09 ENCOUNTER — Encounter: Payer: Medicare Other | Admitting: Physical Medicine & Rehabilitation

## 2017-10-11 ENCOUNTER — Telehealth: Payer: Self-pay

## 2017-10-11 DIAGNOSIS — T8140XD Infection following a procedure, unspecified, subsequent encounter: Secondary | ICD-10-CM | POA: Diagnosis not present

## 2017-10-11 DIAGNOSIS — I739 Peripheral vascular disease, unspecified: Secondary | ICD-10-CM | POA: Diagnosis not present

## 2017-10-11 DIAGNOSIS — Z89612 Acquired absence of left leg above knee: Secondary | ICD-10-CM | POA: Diagnosis not present

## 2017-10-11 DIAGNOSIS — I69354 Hemiplegia and hemiparesis following cerebral infarction affecting left non-dominant side: Secondary | ICD-10-CM | POA: Diagnosis not present

## 2017-10-11 DIAGNOSIS — L89322 Pressure ulcer of left buttock, stage 2: Secondary | ICD-10-CM | POA: Diagnosis not present

## 2017-10-11 DIAGNOSIS — Z4781 Encounter for orthopedic aftercare following surgical amputation: Secondary | ICD-10-CM | POA: Diagnosis not present

## 2017-10-11 DIAGNOSIS — E782 Mixed hyperlipidemia: Secondary | ICD-10-CM | POA: Diagnosis not present

## 2017-10-11 DIAGNOSIS — K219 Gastro-esophageal reflux disease without esophagitis: Secondary | ICD-10-CM | POA: Diagnosis not present

## 2017-10-11 DIAGNOSIS — L89312 Pressure ulcer of right buttock, stage 2: Secondary | ICD-10-CM | POA: Diagnosis not present

## 2017-10-11 DIAGNOSIS — Z4800 Encounter for change or removal of nonsurgical wound dressing: Secondary | ICD-10-CM | POA: Diagnosis not present

## 2017-10-11 DIAGNOSIS — E032 Hypothyroidism due to medicaments and other exogenous substances: Secondary | ICD-10-CM | POA: Diagnosis not present

## 2017-10-11 DIAGNOSIS — G43009 Migraine without aura, not intractable, without status migrainosus: Secondary | ICD-10-CM | POA: Diagnosis not present

## 2017-10-11 DIAGNOSIS — I10 Essential (primary) hypertension: Secondary | ICD-10-CM | POA: Diagnosis not present

## 2017-10-11 DIAGNOSIS — K5900 Constipation, unspecified: Secondary | ICD-10-CM | POA: Diagnosis not present

## 2017-10-11 DIAGNOSIS — L89153 Pressure ulcer of sacral region, stage 3: Secondary | ICD-10-CM | POA: Diagnosis not present

## 2017-10-11 DIAGNOSIS — Z4801 Encounter for change or removal of surgical wound dressing: Secondary | ICD-10-CM | POA: Diagnosis not present

## 2017-10-11 NOTE — Telephone Encounter (Signed)
Patient called requesting a prescription for oxycodone and wants it sent to [facility] in [place].

## 2017-10-11 NOTE — Telephone Encounter (Signed)
Last time patient was seen in clinic was 08-29-2017, before that last appt 2007,had appointment scheduled for 10-09-2017 but was cancelled by patient due to, (Pt stated" She just couldn't make it there today"),   In addition her oral drug test swab had these results:  Oral swab drug screen was negative for prescribed medications. Unable to determine if this is expected or not as not documentation of last dose taken at the 08/29/17 appt.    Patients next appointment moved to 10-30-2017, please advise if we can produce script for her before her next appointment or have her see Zella Ball or wait until appointment date.  Please avise

## 2017-10-13 DIAGNOSIS — T8140XD Infection following a procedure, unspecified, subsequent encounter: Secondary | ICD-10-CM | POA: Diagnosis not present

## 2017-10-13 DIAGNOSIS — K5900 Constipation, unspecified: Secondary | ICD-10-CM | POA: Diagnosis not present

## 2017-10-13 DIAGNOSIS — I10 Essential (primary) hypertension: Secondary | ICD-10-CM | POA: Diagnosis not present

## 2017-10-13 DIAGNOSIS — Z4801 Encounter for change or removal of surgical wound dressing: Secondary | ICD-10-CM | POA: Diagnosis not present

## 2017-10-13 DIAGNOSIS — L89322 Pressure ulcer of left buttock, stage 2: Secondary | ICD-10-CM | POA: Diagnosis not present

## 2017-10-13 DIAGNOSIS — I69354 Hemiplegia and hemiparesis following cerebral infarction affecting left non-dominant side: Secondary | ICD-10-CM | POA: Diagnosis not present

## 2017-10-13 DIAGNOSIS — Z4800 Encounter for change or removal of nonsurgical wound dressing: Secondary | ICD-10-CM | POA: Diagnosis not present

## 2017-10-13 DIAGNOSIS — L89312 Pressure ulcer of right buttock, stage 2: Secondary | ICD-10-CM | POA: Diagnosis not present

## 2017-10-13 DIAGNOSIS — I739 Peripheral vascular disease, unspecified: Secondary | ICD-10-CM | POA: Diagnosis not present

## 2017-10-13 DIAGNOSIS — Z89612 Acquired absence of left leg above knee: Secondary | ICD-10-CM | POA: Diagnosis not present

## 2017-10-13 DIAGNOSIS — L89153 Pressure ulcer of sacral region, stage 3: Secondary | ICD-10-CM | POA: Diagnosis not present

## 2017-10-13 DIAGNOSIS — Z4781 Encounter for orthopedic aftercare following surgical amputation: Secondary | ICD-10-CM | POA: Diagnosis not present

## 2017-10-13 NOTE — Telephone Encounter (Signed)
Patient will have to have a visit with me or Zella Ball to receive medication

## 2017-10-13 NOTE — Telephone Encounter (Signed)
Contacted patient and informed. She will be here on February 4th, 2019

## 2017-10-18 DIAGNOSIS — T8140XD Infection following a procedure, unspecified, subsequent encounter: Secondary | ICD-10-CM | POA: Diagnosis not present

## 2017-10-18 DIAGNOSIS — L89322 Pressure ulcer of left buttock, stage 2: Secondary | ICD-10-CM | POA: Diagnosis not present

## 2017-10-18 DIAGNOSIS — L89153 Pressure ulcer of sacral region, stage 3: Secondary | ICD-10-CM | POA: Diagnosis not present

## 2017-10-18 DIAGNOSIS — Z4801 Encounter for change or removal of surgical wound dressing: Secondary | ICD-10-CM | POA: Diagnosis not present

## 2017-10-18 DIAGNOSIS — I739 Peripheral vascular disease, unspecified: Secondary | ICD-10-CM | POA: Diagnosis not present

## 2017-10-18 DIAGNOSIS — Z4781 Encounter for orthopedic aftercare following surgical amputation: Secondary | ICD-10-CM | POA: Diagnosis not present

## 2017-10-18 DIAGNOSIS — L89312 Pressure ulcer of right buttock, stage 2: Secondary | ICD-10-CM | POA: Diagnosis not present

## 2017-10-18 DIAGNOSIS — Z89612 Acquired absence of left leg above knee: Secondary | ICD-10-CM | POA: Diagnosis not present

## 2017-10-18 DIAGNOSIS — Z4800 Encounter for change or removal of nonsurgical wound dressing: Secondary | ICD-10-CM | POA: Diagnosis not present

## 2017-10-18 DIAGNOSIS — I69354 Hemiplegia and hemiparesis following cerebral infarction affecting left non-dominant side: Secondary | ICD-10-CM | POA: Diagnosis not present

## 2017-10-18 DIAGNOSIS — I10 Essential (primary) hypertension: Secondary | ICD-10-CM | POA: Diagnosis not present

## 2017-10-18 DIAGNOSIS — K5900 Constipation, unspecified: Secondary | ICD-10-CM | POA: Diagnosis not present

## 2017-10-20 DIAGNOSIS — Z4781 Encounter for orthopedic aftercare following surgical amputation: Secondary | ICD-10-CM | POA: Diagnosis not present

## 2017-10-20 DIAGNOSIS — L89322 Pressure ulcer of left buttock, stage 2: Secondary | ICD-10-CM | POA: Diagnosis not present

## 2017-10-20 DIAGNOSIS — I69354 Hemiplegia and hemiparesis following cerebral infarction affecting left non-dominant side: Secondary | ICD-10-CM | POA: Diagnosis not present

## 2017-10-20 DIAGNOSIS — I10 Essential (primary) hypertension: Secondary | ICD-10-CM | POA: Diagnosis not present

## 2017-10-20 DIAGNOSIS — L89312 Pressure ulcer of right buttock, stage 2: Secondary | ICD-10-CM | POA: Diagnosis not present

## 2017-10-20 DIAGNOSIS — Z4800 Encounter for change or removal of nonsurgical wound dressing: Secondary | ICD-10-CM | POA: Diagnosis not present

## 2017-10-20 DIAGNOSIS — L89153 Pressure ulcer of sacral region, stage 3: Secondary | ICD-10-CM | POA: Diagnosis not present

## 2017-10-20 DIAGNOSIS — K5900 Constipation, unspecified: Secondary | ICD-10-CM | POA: Diagnosis not present

## 2017-10-20 DIAGNOSIS — Z89612 Acquired absence of left leg above knee: Secondary | ICD-10-CM | POA: Diagnosis not present

## 2017-10-20 DIAGNOSIS — Z4801 Encounter for change or removal of surgical wound dressing: Secondary | ICD-10-CM | POA: Diagnosis not present

## 2017-10-20 DIAGNOSIS — T8140XD Infection following a procedure, unspecified, subsequent encounter: Secondary | ICD-10-CM | POA: Diagnosis not present

## 2017-10-20 DIAGNOSIS — I739 Peripheral vascular disease, unspecified: Secondary | ICD-10-CM | POA: Diagnosis not present

## 2017-10-25 DIAGNOSIS — Z4801 Encounter for change or removal of surgical wound dressing: Secondary | ICD-10-CM | POA: Diagnosis not present

## 2017-10-25 DIAGNOSIS — T8140XD Infection following a procedure, unspecified, subsequent encounter: Secondary | ICD-10-CM | POA: Diagnosis not present

## 2017-10-25 DIAGNOSIS — L89312 Pressure ulcer of right buttock, stage 2: Secondary | ICD-10-CM | POA: Diagnosis not present

## 2017-10-25 DIAGNOSIS — Z4781 Encounter for orthopedic aftercare following surgical amputation: Secondary | ICD-10-CM | POA: Diagnosis not present

## 2017-10-25 DIAGNOSIS — I739 Peripheral vascular disease, unspecified: Secondary | ICD-10-CM | POA: Diagnosis not present

## 2017-10-25 DIAGNOSIS — Z4800 Encounter for change or removal of nonsurgical wound dressing: Secondary | ICD-10-CM | POA: Diagnosis not present

## 2017-10-25 DIAGNOSIS — L89322 Pressure ulcer of left buttock, stage 2: Secondary | ICD-10-CM | POA: Diagnosis not present

## 2017-10-25 DIAGNOSIS — I69354 Hemiplegia and hemiparesis following cerebral infarction affecting left non-dominant side: Secondary | ICD-10-CM | POA: Diagnosis not present

## 2017-10-25 DIAGNOSIS — L89153 Pressure ulcer of sacral region, stage 3: Secondary | ICD-10-CM | POA: Diagnosis not present

## 2017-10-25 DIAGNOSIS — Z89612 Acquired absence of left leg above knee: Secondary | ICD-10-CM | POA: Diagnosis not present

## 2017-10-25 DIAGNOSIS — I10 Essential (primary) hypertension: Secondary | ICD-10-CM | POA: Diagnosis not present

## 2017-10-25 DIAGNOSIS — K5900 Constipation, unspecified: Secondary | ICD-10-CM | POA: Diagnosis not present

## 2017-10-26 DIAGNOSIS — H25012 Cortical age-related cataract, left eye: Secondary | ICD-10-CM | POA: Diagnosis not present

## 2017-10-26 DIAGNOSIS — H34811 Central retinal vein occlusion, right eye, with macular edema: Secondary | ICD-10-CM | POA: Diagnosis not present

## 2017-10-26 DIAGNOSIS — E113392 Type 2 diabetes mellitus with moderate nonproliferative diabetic retinopathy without macular edema, left eye: Secondary | ICD-10-CM | POA: Diagnosis not present

## 2017-10-26 DIAGNOSIS — H35033 Hypertensive retinopathy, bilateral: Secondary | ICD-10-CM | POA: Diagnosis not present

## 2017-10-27 DIAGNOSIS — Z4781 Encounter for orthopedic aftercare following surgical amputation: Secondary | ICD-10-CM | POA: Diagnosis not present

## 2017-10-27 DIAGNOSIS — Z89612 Acquired absence of left leg above knee: Secondary | ICD-10-CM | POA: Diagnosis not present

## 2017-10-27 DIAGNOSIS — Z4800 Encounter for change or removal of nonsurgical wound dressing: Secondary | ICD-10-CM | POA: Diagnosis not present

## 2017-10-27 DIAGNOSIS — L89153 Pressure ulcer of sacral region, stage 3: Secondary | ICD-10-CM | POA: Diagnosis not present

## 2017-10-27 DIAGNOSIS — T8140XD Infection following a procedure, unspecified, subsequent encounter: Secondary | ICD-10-CM | POA: Diagnosis not present

## 2017-10-27 DIAGNOSIS — Z4801 Encounter for change or removal of surgical wound dressing: Secondary | ICD-10-CM | POA: Diagnosis not present

## 2017-10-27 DIAGNOSIS — K5900 Constipation, unspecified: Secondary | ICD-10-CM | POA: Diagnosis not present

## 2017-10-27 DIAGNOSIS — I69354 Hemiplegia and hemiparesis following cerebral infarction affecting left non-dominant side: Secondary | ICD-10-CM | POA: Diagnosis not present

## 2017-10-27 DIAGNOSIS — I10 Essential (primary) hypertension: Secondary | ICD-10-CM | POA: Diagnosis not present

## 2017-10-27 DIAGNOSIS — I739 Peripheral vascular disease, unspecified: Secondary | ICD-10-CM | POA: Diagnosis not present

## 2017-10-27 DIAGNOSIS — L89322 Pressure ulcer of left buttock, stage 2: Secondary | ICD-10-CM | POA: Diagnosis not present

## 2017-10-27 DIAGNOSIS — L89312 Pressure ulcer of right buttock, stage 2: Secondary | ICD-10-CM | POA: Diagnosis not present

## 2017-10-28 DIAGNOSIS — S78112A Complete traumatic amputation at level between left hip and knee, initial encounter: Secondary | ICD-10-CM | POA: Diagnosis not present

## 2017-10-30 ENCOUNTER — Encounter: Payer: Self-pay | Admitting: Physical Medicine & Rehabilitation

## 2017-10-30 ENCOUNTER — Encounter: Payer: Medicare Other | Attending: Physical Medicine & Rehabilitation | Admitting: Physical Medicine & Rehabilitation

## 2017-10-30 VITALS — BP 115/67 | HR 69

## 2017-10-30 DIAGNOSIS — Z5181 Encounter for therapeutic drug level monitoring: Secondary | ICD-10-CM | POA: Diagnosis not present

## 2017-10-30 DIAGNOSIS — I69354 Hemiplegia and hemiparesis following cerebral infarction affecting left non-dominant side: Secondary | ICD-10-CM | POA: Diagnosis not present

## 2017-10-30 DIAGNOSIS — R51 Headache: Secondary | ICD-10-CM | POA: Diagnosis not present

## 2017-10-30 DIAGNOSIS — Z79899 Other long term (current) drug therapy: Secondary | ICD-10-CM | POA: Diagnosis not present

## 2017-10-30 DIAGNOSIS — F17210 Nicotine dependence, cigarettes, uncomplicated: Secondary | ICD-10-CM | POA: Insufficient documentation

## 2017-10-30 DIAGNOSIS — L02211 Cutaneous abscess of abdominal wall: Secondary | ICD-10-CM | POA: Diagnosis not present

## 2017-10-30 DIAGNOSIS — H44009 Unspecified purulent endophthalmitis, unspecified eye: Secondary | ICD-10-CM | POA: Diagnosis not present

## 2017-10-30 DIAGNOSIS — Z9889 Other specified postprocedural states: Secondary | ICD-10-CM | POA: Diagnosis not present

## 2017-10-30 DIAGNOSIS — Z89612 Acquired absence of left leg above knee: Secondary | ICD-10-CM | POA: Diagnosis not present

## 2017-10-30 DIAGNOSIS — Z4781 Encounter for orthopedic aftercare following surgical amputation: Secondary | ICD-10-CM | POA: Insufficient documentation

## 2017-10-30 DIAGNOSIS — M792 Neuralgia and neuritis, unspecified: Secondary | ICD-10-CM | POA: Diagnosis not present

## 2017-10-30 DIAGNOSIS — S78112A Complete traumatic amputation at level between left hip and knee, initial encounter: Secondary | ICD-10-CM

## 2017-10-30 MED ORDER — OXYCODONE-ACETAMINOPHEN 5-325 MG PO TABS: 1.0000 | ORAL_TABLET | Freq: Three times a day (TID) | ORAL | 0 refills | Status: DC | PRN

## 2017-10-30 NOTE — Patient Instructions (Signed)
PLEASE FEEL FREE TO CALL OUR OFFICE WITH ANY PROBLEMS OR QUESTIONS ([phone])      

## 2017-10-30 NOTE — Progress Notes (Signed)
UDS cancelled by Dr. Swartz 

## 2017-10-30 NOTE — Progress Notes (Signed)
Subjective:    Patient ID: Anna Hayes, female    DOB: September 07, 1951, 67 y.o.   MRN: [id number]  HPI  Anna Hayes is here in follow up of her left AKA and rehab stay. She has been working on putting on [facility] better.  Therapy still comes out to the house.  She does standing on a limited basis with therapy but does not appear to be doing much on her own.  She is transferring more easily.  She didn't take the topamax as prescribed. She's using it prn.  Her pain is somewhat better.  She ran out of her Percocet several weeks ago.  Constipation is improved.  She is found that diet makes a difference.  Pain Inventory Average Pain 6 Pain Right Now 5 My pain is sharp  In the last 24 hours, has pain interfered with the following? General activity 5 Relation with others 5 Enjoyment of life 5 What TIME of day is your pain at its worst? evening Sleep (in general) Fair  Pain is worse with: standing Pain improves with: rest and medication Relief from Meds: 8  Mobility ability to climb steps?  no do you drive?  no  Function retired  Neuro/Psych No problems in this area  Prior Studies Any changes since last visit?  no  Physicians involved in your care Any changes since last visit?  no   No family history on file. Social History   Socioeconomic History  . Marital status: Widowed    Spouse name: None  . Number of children: None  . Years of education: None  . Highest education level: None  Social Needs  . Financial resource strain: None  . Food insecurity - worry: None  . Food insecurity - inability: None  . Transportation needs - medical: None  . Transportation needs - non-medical: None  Occupational History  . None  Tobacco Use  . Smoking status: Current Some Day Smoker    Packs/day: 0.50    Types: Cigarettes  . Smokeless tobacco: Never Used  Substance and Sexual Activity  . Alcohol use: No  . Drug use: No  . Sexual activity: None  Other Topics  Concern  . None  Social History Narrative  . None   Past Surgical History:  Procedure Laterality Date  . AMPUTATION Left 07/05/2017   Procedure: LEFT ABOVE KNEE AMPUTATION;  Surgeon: Conrad [place], MD;  Location: [place];  Service: Vascular;  Laterality: Left;  . EMBOLECTOMY Bilateral 06/27/2017   Procedure: BILATERAL FEMORAL POPLITEAL EMBOLECTOMY;  Surgeon: Serafina Mitchell, MD;  [facility];  Service: Vascular;  Laterality: Bilateral;  . FASCIOTOMY Left 06/27/2017   Procedure: FULL COMPARTMENT LEFT LOWER LEG FASCIOTOMY;  Surgeon: Serafina Mitchell, MD;  Location: [place];  Service: Vascular;  Laterality: Left;  . PATCH ANGIOPLASTY Left 06/27/2017   Procedure: PATCH ANGIOPLASTY Left Posterior Tibial Artery;  Surgeon: Serafina Mitchell, MD;  Location: [place];  Service: Vascular;  Laterality: Left;  . 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 07/11/2017   Procedure: REPAIR OF COMPLEX TRACTION RETINAL DETACHMENT WITH ENDO LASER AND ANTIFUNGAL INJECTION;  Surgeon: Jalene Mullet, MD;  Location: [place];  Service: Ophthalmology;  Laterality: Right;  . TEE WITHOUT CARDIOVERSION N/A 07/11/2017   Procedure: TRANSESOPHAGEAL ECHOCARDIOGRAM (TEE);  Surgeon: Acie Fredrickson Wonda Cheng, MD;  Location: [facility] ENDOSCOPY;  Service: Cardiovascular;  Laterality: N/A;   Past Medical History:  Diagnosis Date  . Frequent headaches   . Hypertension   . Muscle  pain   . Palpitations   . Septic thrombophlebitis 08/31/2017  . Sinus problem   . Stroke ([place])    BP 115/67   Pulse 69   SpO2 98%   Opioid Risk Score:   Fall Risk Score:  `1  Depression screen PHQ 2/9  Depression screen [facility] 2/9 10/02/2017 08/31/2017 08/29/2017 08/29/2017  Decreased Interest 0 0 0 0  Down, Depressed, Hopeless 0 0 0 0  PHQ - 2 Score 0 0 0 0  Altered sleeping - - 0 -  Tired, decreased energy - - 0 -  Change in appetite - - 1 -  Feeling bad or failure about yourself  - - 1 -  Trouble concentrating - - 0 -  Moving slowly or  fidgety/restless - - 0 -  Suicidal thoughts - - 0 -  PHQ-9 Score - - 2 -  Difficult doing work/chores - - Not difficult at all -     Review of Systems  Constitutional: Negative.   HENT: Negative.   Eyes: Negative.   Respiratory: Negative.   Cardiovascular: Negative.   Gastrointestinal: Negative.   Endocrine: Negative.   Genitourinary: Negative.   Musculoskeletal: Negative.   Skin: Negative.   Allergic/Immunologic: Negative.   Neurological: Negative.   Hematological: Negative.   Psychiatric/Behavioral: Negative.   All other systems reviewed and are negative.      Objective:   Physical Exam  Constitutional: She appears well-developed. Obese HENT: Normocephalic and atraumatic.  Eyes: EOM are normal. Right eye exhibits no discharge. Left eye exhibits no discharge.  Neck: Normal range of motion. Neck supple.  Cardiovascular: RRR without murmur. No JVD  Respiratory: clear  GI: BS+, vac in place and sealed. Minimally tender Musculoskeletal: Stump is well formed with 1+ edema still neurological: She is alert.    Speech clear LUE with edema  Able to follow basic commands without difficulty.  Motor: RUE: 5/5 proximal to distal LUE: 0/5 proximal to distal with tone and contracture in wrist and fingers--no changes RLE: HF, KE 4/5, ADF/PF 4/5 (stable) LLE: HF 4/5 --stable skin:  AK clean and intact.  Wound has healed.  Edema has resolved essentially.  Abdominal wound only with a 3 x 1 cm scab.  Completely dry. Psychiatric: Her affect is bright        Assessment & Plan:  1. Decreased functional mobility secondary to left AKA10/06/2017 as well as history of CVA with left-sided residual weakness. -therapy to continue at home.     -Stressed to her that if she is interested in using and acquiring a prosthesis that she needs to work on her standing tolerance and balance.  Even so, she is facing uphill battle given her left hemiparesis.  I will discuss  with the prosthetist -continue with shrinker   .    2. Pain Management/headaches: stop gabapentin ( as she's not taking)             -Resume Topamax 25-50 mg nightly for migraine prophylaxis.  Stressed to her that this needs to be used on a scheduled basis-will write more oxycodone---want to wean off.  3. Skin/Wound Care: Made a referral to vascular surgery for staple removal  8.Abdominal abscesses. Status post laparotomy, lysis of adhesions repair perforated bowel 06/19/2017 at [facility] post percutaneous drain placement October 1             -may get wound wet, needs to dry off after showeing 9.Fungemia with endophthalmitis.follow-up ophthalmology services.status post antibiotic treatment.  Patient has scheduled  infectious disease follow-up this week 11. Constipation resolved: Able to manage with diet and stool softener   I will follow-up with the patient in about 2 month's time.  15 minutes of time was spent directly with the patient in consultation and examination today.

## 2017-11-01 DIAGNOSIS — I10 Essential (primary) hypertension: Secondary | ICD-10-CM | POA: Diagnosis not present

## 2017-11-01 DIAGNOSIS — T8140XD Infection following a procedure, unspecified, subsequent encounter: Secondary | ICD-10-CM | POA: Diagnosis not present

## 2017-11-01 DIAGNOSIS — L89312 Pressure ulcer of right buttock, stage 2: Secondary | ICD-10-CM | POA: Diagnosis not present

## 2017-11-01 DIAGNOSIS — Z4781 Encounter for orthopedic aftercare following surgical amputation: Secondary | ICD-10-CM | POA: Diagnosis not present

## 2017-11-01 DIAGNOSIS — L89322 Pressure ulcer of left buttock, stage 2: Secondary | ICD-10-CM | POA: Diagnosis not present

## 2017-11-01 DIAGNOSIS — I739 Peripheral vascular disease, unspecified: Secondary | ICD-10-CM | POA: Diagnosis not present

## 2017-11-01 DIAGNOSIS — Z89612 Acquired absence of left leg above knee: Secondary | ICD-10-CM | POA: Diagnosis not present

## 2017-11-01 DIAGNOSIS — K5900 Constipation, unspecified: Secondary | ICD-10-CM | POA: Diagnosis not present

## 2017-11-01 DIAGNOSIS — Z4801 Encounter for change or removal of surgical wound dressing: Secondary | ICD-10-CM | POA: Diagnosis not present

## 2017-11-01 DIAGNOSIS — Z4800 Encounter for change or removal of nonsurgical wound dressing: Secondary | ICD-10-CM | POA: Diagnosis not present

## 2017-11-01 DIAGNOSIS — I69354 Hemiplegia and hemiparesis following cerebral infarction affecting left non-dominant side: Secondary | ICD-10-CM | POA: Diagnosis not present

## 2017-11-01 DIAGNOSIS — L89153 Pressure ulcer of sacral region, stage 3: Secondary | ICD-10-CM | POA: Diagnosis not present

## 2017-11-02 DIAGNOSIS — L89153 Pressure ulcer of sacral region, stage 3: Secondary | ICD-10-CM | POA: Diagnosis not present

## 2017-11-02 DIAGNOSIS — T8140XD Infection following a procedure, unspecified, subsequent encounter: Secondary | ICD-10-CM | POA: Diagnosis not present

## 2017-11-02 DIAGNOSIS — Z4781 Encounter for orthopedic aftercare following surgical amputation: Secondary | ICD-10-CM | POA: Diagnosis not present

## 2017-11-02 DIAGNOSIS — L89322 Pressure ulcer of left buttock, stage 2: Secondary | ICD-10-CM | POA: Diagnosis not present

## 2017-11-03 DIAGNOSIS — Z89612 Acquired absence of left leg above knee: Secondary | ICD-10-CM | POA: Diagnosis not present

## 2017-11-03 DIAGNOSIS — L89322 Pressure ulcer of left buttock, stage 2: Secondary | ICD-10-CM | POA: Diagnosis not present

## 2017-11-03 DIAGNOSIS — I10 Essential (primary) hypertension: Secondary | ICD-10-CM | POA: Diagnosis not present

## 2017-11-03 DIAGNOSIS — L89153 Pressure ulcer of sacral region, stage 3: Secondary | ICD-10-CM | POA: Diagnosis not present

## 2017-11-03 DIAGNOSIS — Z4781 Encounter for orthopedic aftercare following surgical amputation: Secondary | ICD-10-CM | POA: Diagnosis not present

## 2017-11-03 DIAGNOSIS — L89312 Pressure ulcer of right buttock, stage 2: Secondary | ICD-10-CM | POA: Diagnosis not present

## 2017-11-03 DIAGNOSIS — I69354 Hemiplegia and hemiparesis following cerebral infarction affecting left non-dominant side: Secondary | ICD-10-CM | POA: Diagnosis not present

## 2017-11-03 DIAGNOSIS — Z4800 Encounter for change or removal of nonsurgical wound dressing: Secondary | ICD-10-CM | POA: Diagnosis not present

## 2017-11-03 DIAGNOSIS — T8140XD Infection following a procedure, unspecified, subsequent encounter: Secondary | ICD-10-CM | POA: Diagnosis not present

## 2017-11-03 DIAGNOSIS — Z4801 Encounter for change or removal of surgical wound dressing: Secondary | ICD-10-CM | POA: Diagnosis not present

## 2017-11-03 DIAGNOSIS — I739 Peripheral vascular disease, unspecified: Secondary | ICD-10-CM | POA: Diagnosis not present

## 2017-11-03 DIAGNOSIS — K5900 Constipation, unspecified: Secondary | ICD-10-CM | POA: Diagnosis not present

## 2017-11-08 DIAGNOSIS — Z4801 Encounter for change or removal of surgical wound dressing: Secondary | ICD-10-CM | POA: Diagnosis not present

## 2017-11-08 DIAGNOSIS — L89312 Pressure ulcer of right buttock, stage 2: Secondary | ICD-10-CM | POA: Diagnosis not present

## 2017-11-08 DIAGNOSIS — L89322 Pressure ulcer of left buttock, stage 2: Secondary | ICD-10-CM | POA: Diagnosis not present

## 2017-11-08 DIAGNOSIS — T8140XD Infection following a procedure, unspecified, subsequent encounter: Secondary | ICD-10-CM | POA: Diagnosis not present

## 2017-11-08 DIAGNOSIS — Z4781 Encounter for orthopedic aftercare following surgical amputation: Secondary | ICD-10-CM | POA: Diagnosis not present

## 2017-11-08 DIAGNOSIS — I69354 Hemiplegia and hemiparesis following cerebral infarction affecting left non-dominant side: Secondary | ICD-10-CM | POA: Diagnosis not present

## 2017-11-08 DIAGNOSIS — I10 Essential (primary) hypertension: Secondary | ICD-10-CM | POA: Diagnosis not present

## 2017-11-08 DIAGNOSIS — Z4800 Encounter for change or removal of nonsurgical wound dressing: Secondary | ICD-10-CM | POA: Diagnosis not present

## 2017-11-08 DIAGNOSIS — Z89612 Acquired absence of left leg above knee: Secondary | ICD-10-CM | POA: Diagnosis not present

## 2017-11-08 DIAGNOSIS — L89153 Pressure ulcer of sacral region, stage 3: Secondary | ICD-10-CM | POA: Diagnosis not present

## 2017-11-08 DIAGNOSIS — I739 Peripheral vascular disease, unspecified: Secondary | ICD-10-CM | POA: Diagnosis not present

## 2017-11-08 DIAGNOSIS — K5900 Constipation, unspecified: Secondary | ICD-10-CM | POA: Diagnosis not present

## 2017-11-10 DIAGNOSIS — L89312 Pressure ulcer of right buttock, stage 2: Secondary | ICD-10-CM | POA: Diagnosis not present

## 2017-11-10 DIAGNOSIS — L89322 Pressure ulcer of left buttock, stage 2: Secondary | ICD-10-CM | POA: Diagnosis not present

## 2017-11-10 DIAGNOSIS — Z4800 Encounter for change or removal of nonsurgical wound dressing: Secondary | ICD-10-CM | POA: Diagnosis not present

## 2017-11-10 DIAGNOSIS — I739 Peripheral vascular disease, unspecified: Secondary | ICD-10-CM | POA: Diagnosis not present

## 2017-11-10 DIAGNOSIS — Z4801 Encounter for change or removal of surgical wound dressing: Secondary | ICD-10-CM | POA: Diagnosis not present

## 2017-11-10 DIAGNOSIS — Z4781 Encounter for orthopedic aftercare following surgical amputation: Secondary | ICD-10-CM | POA: Diagnosis not present

## 2017-11-10 DIAGNOSIS — Z89612 Acquired absence of left leg above knee: Secondary | ICD-10-CM | POA: Diagnosis not present

## 2017-11-10 DIAGNOSIS — I10 Essential (primary) hypertension: Secondary | ICD-10-CM | POA: Diagnosis not present

## 2017-11-10 DIAGNOSIS — K5900 Constipation, unspecified: Secondary | ICD-10-CM | POA: Diagnosis not present

## 2017-11-10 DIAGNOSIS — I69354 Hemiplegia and hemiparesis following cerebral infarction affecting left non-dominant side: Secondary | ICD-10-CM | POA: Diagnosis not present

## 2017-11-10 DIAGNOSIS — T8140XD Infection following a procedure, unspecified, subsequent encounter: Secondary | ICD-10-CM | POA: Diagnosis not present

## 2017-11-10 DIAGNOSIS — L89153 Pressure ulcer of sacral region, stage 3: Secondary | ICD-10-CM | POA: Diagnosis not present

## 2017-11-14 DIAGNOSIS — H34811 Central retinal vein occlusion, right eye, with macular edema: Secondary | ICD-10-CM | POA: Diagnosis not present

## 2017-11-15 DIAGNOSIS — Z4781 Encounter for orthopedic aftercare following surgical amputation: Secondary | ICD-10-CM | POA: Diagnosis not present

## 2017-11-15 DIAGNOSIS — K5900 Constipation, unspecified: Secondary | ICD-10-CM | POA: Diagnosis not present

## 2017-11-15 DIAGNOSIS — I69354 Hemiplegia and hemiparesis following cerebral infarction affecting left non-dominant side: Secondary | ICD-10-CM | POA: Diagnosis not present

## 2017-11-15 DIAGNOSIS — L89322 Pressure ulcer of left buttock, stage 2: Secondary | ICD-10-CM | POA: Diagnosis not present

## 2017-11-15 DIAGNOSIS — I739 Peripheral vascular disease, unspecified: Secondary | ICD-10-CM | POA: Diagnosis not present

## 2017-11-15 DIAGNOSIS — Z89612 Acquired absence of left leg above knee: Secondary | ICD-10-CM | POA: Diagnosis not present

## 2017-11-15 DIAGNOSIS — L89153 Pressure ulcer of sacral region, stage 3: Secondary | ICD-10-CM | POA: Diagnosis not present

## 2017-11-15 DIAGNOSIS — Z4800 Encounter for change or removal of nonsurgical wound dressing: Secondary | ICD-10-CM | POA: Diagnosis not present

## 2017-11-15 DIAGNOSIS — L89312 Pressure ulcer of right buttock, stage 2: Secondary | ICD-10-CM | POA: Diagnosis not present

## 2017-11-15 DIAGNOSIS — Z4801 Encounter for change or removal of surgical wound dressing: Secondary | ICD-10-CM | POA: Diagnosis not present

## 2017-11-15 DIAGNOSIS — I10 Essential (primary) hypertension: Secondary | ICD-10-CM | POA: Diagnosis not present

## 2017-11-15 DIAGNOSIS — T8140XD Infection following a procedure, unspecified, subsequent encounter: Secondary | ICD-10-CM | POA: Diagnosis not present

## 2017-11-17 DIAGNOSIS — I739 Peripheral vascular disease, unspecified: Secondary | ICD-10-CM | POA: Diagnosis not present

## 2017-11-17 DIAGNOSIS — K5900 Constipation, unspecified: Secondary | ICD-10-CM | POA: Diagnosis not present

## 2017-11-17 DIAGNOSIS — Z89612 Acquired absence of left leg above knee: Secondary | ICD-10-CM | POA: Diagnosis not present

## 2017-11-17 DIAGNOSIS — Z4781 Encounter for orthopedic aftercare following surgical amputation: Secondary | ICD-10-CM | POA: Diagnosis not present

## 2017-11-17 DIAGNOSIS — T8140XD Infection following a procedure, unspecified, subsequent encounter: Secondary | ICD-10-CM | POA: Diagnosis not present

## 2017-11-17 DIAGNOSIS — L89312 Pressure ulcer of right buttock, stage 2: Secondary | ICD-10-CM | POA: Diagnosis not present

## 2017-11-17 DIAGNOSIS — Z4801 Encounter for change or removal of surgical wound dressing: Secondary | ICD-10-CM | POA: Diagnosis not present

## 2017-11-17 DIAGNOSIS — I69354 Hemiplegia and hemiparesis following cerebral infarction affecting left non-dominant side: Secondary | ICD-10-CM | POA: Diagnosis not present

## 2017-11-17 DIAGNOSIS — L89153 Pressure ulcer of sacral region, stage 3: Secondary | ICD-10-CM | POA: Diagnosis not present

## 2017-11-17 DIAGNOSIS — L89322 Pressure ulcer of left buttock, stage 2: Secondary | ICD-10-CM | POA: Diagnosis not present

## 2017-11-17 DIAGNOSIS — I10 Essential (primary) hypertension: Secondary | ICD-10-CM | POA: Diagnosis not present

## 2017-11-17 DIAGNOSIS — Z4800 Encounter for change or removal of nonsurgical wound dressing: Secondary | ICD-10-CM | POA: Diagnosis not present

## 2017-11-22 DIAGNOSIS — Z4781 Encounter for orthopedic aftercare following surgical amputation: Secondary | ICD-10-CM | POA: Diagnosis not present

## 2017-11-22 DIAGNOSIS — Z4801 Encounter for change or removal of surgical wound dressing: Secondary | ICD-10-CM | POA: Diagnosis not present

## 2017-11-22 DIAGNOSIS — T8140XD Infection following a procedure, unspecified, subsequent encounter: Secondary | ICD-10-CM | POA: Diagnosis not present

## 2017-11-22 DIAGNOSIS — L89312 Pressure ulcer of right buttock, stage 2: Secondary | ICD-10-CM | POA: Diagnosis not present

## 2017-11-22 DIAGNOSIS — Z89612 Acquired absence of left leg above knee: Secondary | ICD-10-CM | POA: Diagnosis not present

## 2017-11-22 DIAGNOSIS — L89153 Pressure ulcer of sacral region, stage 3: Secondary | ICD-10-CM | POA: Diagnosis not present

## 2017-11-22 DIAGNOSIS — Z4800 Encounter for change or removal of nonsurgical wound dressing: Secondary | ICD-10-CM | POA: Diagnosis not present

## 2017-11-22 DIAGNOSIS — I69354 Hemiplegia and hemiparesis following cerebral infarction affecting left non-dominant side: Secondary | ICD-10-CM | POA: Diagnosis not present

## 2017-11-22 DIAGNOSIS — I10 Essential (primary) hypertension: Secondary | ICD-10-CM | POA: Diagnosis not present

## 2017-11-22 DIAGNOSIS — I739 Peripheral vascular disease, unspecified: Secondary | ICD-10-CM | POA: Diagnosis not present

## 2017-11-22 DIAGNOSIS — K5900 Constipation, unspecified: Secondary | ICD-10-CM | POA: Diagnosis not present

## 2017-11-22 DIAGNOSIS — L89322 Pressure ulcer of left buttock, stage 2: Secondary | ICD-10-CM | POA: Diagnosis not present

## 2017-11-25 DIAGNOSIS — S78112A Complete traumatic amputation at level between left hip and knee, initial encounter: Secondary | ICD-10-CM | POA: Diagnosis not present

## 2017-11-26 ENCOUNTER — Other Ambulatory Visit: Payer: Self-pay | Admitting: Physical Medicine & Rehabilitation

## 2017-11-26 DIAGNOSIS — M792 Neuralgia and neuritis, unspecified: Secondary | ICD-10-CM

## 2017-11-26 DIAGNOSIS — G43001 Migraine without aura, not intractable, with status migrainosus: Secondary | ICD-10-CM

## 2017-11-29 NOTE — Telephone Encounter (Signed)
error 

## 2017-12-08 ENCOUNTER — Ambulatory Visit: Payer: Medicare Other | Admitting: Family

## 2017-12-08 ENCOUNTER — Encounter (HOSPITAL_COMMUNITY): Payer: Medicare Other

## 2017-12-18 DIAGNOSIS — H34811 Central retinal vein occlusion, right eye, with macular edema: Secondary | ICD-10-CM | POA: Diagnosis not present

## 2017-12-26 DIAGNOSIS — S78112A Complete traumatic amputation at level between left hip and knee, initial encounter: Secondary | ICD-10-CM | POA: Diagnosis not present

## 2017-12-27 ENCOUNTER — Encounter: Payer: Self-pay | Admitting: Physical Medicine & Rehabilitation

## 2017-12-27 ENCOUNTER — Encounter: Payer: Medicare Other | Attending: Physical Medicine & Rehabilitation | Admitting: Physical Medicine & Rehabilitation

## 2017-12-27 VITALS — BP 112/67 | HR 68 | Ht 62.0 in | Wt 150.0 lb

## 2017-12-27 DIAGNOSIS — R51 Headache: Secondary | ICD-10-CM | POA: Insufficient documentation

## 2017-12-27 DIAGNOSIS — H44009 Unspecified purulent endophthalmitis, unspecified eye: Secondary | ICD-10-CM | POA: Insufficient documentation

## 2017-12-27 DIAGNOSIS — G8114 Spastic hemiplegia affecting left nondominant side: Secondary | ICD-10-CM | POA: Diagnosis not present

## 2017-12-27 DIAGNOSIS — L02211 Cutaneous abscess of abdominal wall: Secondary | ICD-10-CM | POA: Diagnosis not present

## 2017-12-27 DIAGNOSIS — S78112A Complete traumatic amputation at level between left hip and knee, initial encounter: Secondary | ICD-10-CM

## 2017-12-27 DIAGNOSIS — Z9889 Other specified postprocedural states: Secondary | ICD-10-CM | POA: Diagnosis not present

## 2017-12-27 DIAGNOSIS — Z89612 Acquired absence of left leg above knee: Secondary | ICD-10-CM | POA: Insufficient documentation

## 2017-12-27 DIAGNOSIS — F17210 Nicotine dependence, cigarettes, uncomplicated: Secondary | ICD-10-CM | POA: Insufficient documentation

## 2017-12-27 DIAGNOSIS — B49 Unspecified mycosis: Secondary | ICD-10-CM

## 2017-12-27 DIAGNOSIS — I69354 Hemiplegia and hemiparesis following cerebral infarction affecting left non-dominant side: Secondary | ICD-10-CM | POA: Insufficient documentation

## 2017-12-27 DIAGNOSIS — Z4781 Encounter for orthopedic aftercare following surgical amputation: Secondary | ICD-10-CM | POA: Insufficient documentation

## 2017-12-27 DIAGNOSIS — I739 Peripheral vascular disease, unspecified: Secondary | ICD-10-CM

## 2017-12-27 NOTE — Patient Instructions (Signed)
PLEASE FEEL FREE TO CALL OUR OFFICE WITH ANY PROBLEMS OR QUESTIONS ([phone])      

## 2017-12-27 NOTE — Progress Notes (Signed)
Subjective:    Patient ID: Anna Hayes, female    DOB: 1951-01-18, 67 y.o.   MRN: [id number]  HPI   Anna Hayes is here in follow up of her left AKA.  She is completed home health therapies and is working on a home exercise program of her own.  She would like to get back to her Silver sneakers program at the [facility].  She states that she is been fairly active at home.  Daughter states that she is been up at the sink washing dishes and can transfer and moved completely on her own.  She is using her wheelchair as her primary means of transportation.  Pain is minimal at this point.  She still has some stump and abdominal pain.  Her abdominal wound has healed essentially.    Pain Inventory Average Pain 4 Pain Right Now 3 My pain is intermittent  In the last 24 hours, has pain interfered with the following? General activity 2 Relation with others 3 Enjoyment of life 0 What TIME of day is your pain at its worst? daytime Sleep (in general) Fair  Pain is worse with: some activites Pain improves with: . Relief from Meds: 7  Mobility use a wheelchair needs help with transfers  Function I need assistance with the following:  bathing and household duties  Neuro/Psych trouble walking  Prior Studies Any changes since last visit?  no  Physicians involved in your care Any changes since last visit?  no   No family history on file. Social History   Socioeconomic History  . Marital status: Widowed    Spouse name: Not on file  . Number of children: Not on file  . Years of education: Not on file  . Highest education level: Not on file  Occupational History  . Not on file  Social Needs  . Financial resource strain: Not on file  . Food insecurity:    Worry: Not on file    Inability: Not on file  . Transportation needs:    Medical: Not on file    Non-medical: Not on file  Tobacco Use  . Smoking status: Current Some Day Smoker    Packs/day: 0.50    Types: Cigarettes    . Smokeless tobacco: Never Used  Substance and Sexual Activity  . Alcohol use: No  . Drug use: No  . Sexual activity: Not on file  Lifestyle  . Physical activity:    Days per week: Not on file    Minutes per session: Not on file  . Stress: Not on file  Relationships  . Social connections:    Talks on phone: Not on file    Gets together: Not on file    Attends religious service: Not on file    Active member of club or organization: Not on file    Attends meetings of clubs or organizations: Not on file    Relationship status: Not on file  Other Topics Concern  . Not on file  Social History Narrative  . Not on file   Past Surgical History:  Procedure Laterality Date  . AMPUTATION Left 07/05/2017   Procedure: LEFT ABOVE KNEE AMPUTATION;  Surgeon: Conrad Houghton, MD;  Location: [place];  Service: Vascular;  Laterality: Left;  . EMBOLECTOMY Bilateral 06/27/2017   Procedure: BILATERAL FEMORAL POPLITEAL EMBOLECTOMY;  Surgeon: Serafina Mitchell, MD;  [facility];  Service: Vascular;  Laterality: Bilateral;  . FASCIOTOMY Left 06/27/2017   Procedure: FULL COMPARTMENT LEFT  LOWER LEG FASCIOTOMY;  Surgeon: Serafina Mitchell, MD;  Location: [place];  Service: Vascular;  Laterality: Left;  . PATCH ANGIOPLASTY Left 06/27/2017   Procedure: PATCH ANGIOPLASTY Left Posterior Tibial Artery;  Surgeon: Serafina Mitchell, MD;  Location: [place];  Service: Vascular;  Laterality: Left;  . 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 07/11/2017   Procedure: REPAIR OF COMPLEX TRACTION RETINAL DETACHMENT WITH ENDO LASER AND ANTIFUNGAL INJECTION;  Surgeon: Jalene Mullet, MD;  Location: [place];  Service: Ophthalmology;  Laterality: Right;  . TEE WITHOUT CARDIOVERSION N/A 07/11/2017   Procedure: TRANSESOPHAGEAL ECHOCARDIOGRAM (TEE);  Surgeon: Acie Fredrickson Wonda Cheng, MD;  Location: [facility] ENDOSCOPY;  Service: Cardiovascular;  Laterality: N/A;   Past Medical History:  Diagnosis Date  . Frequent headaches   .  Hypertension   . Muscle pain   . Palpitations   . Septic thrombophlebitis 08/31/2017  . Sinus problem   . Stroke (HCC)    BP 112/67   Pulse 68   Ht 5\' 2"  (1.575 m) Comment: states  Wt 150 lb (68 kg) Comment: states  SpO2 99%   BMI 27.44 kg/m   Opioid Risk Score:   Fall Risk Score:  `1  Depression screen PHQ 2/9  Depression screen [facility] 2/9 10/02/2017 08/31/2017 08/29/2017 08/29/2017  Decreased Interest 0 0 0 0  Down, Depressed, Hopeless 0 0 0 0  PHQ - 2 Score 0 0 0 0  Altered sleeping - - 0 -  Tired, decreased energy - - 0 -  Change in appetite - - 1 -  Feeling bad or failure about yourself  - - 1 -  Trouble concentrating - - 0 -  Moving slowly or fidgety/restless - - 0 -  Suicidal thoughts - - 0 -  PHQ-9 Score - - 2 -  Difficult doing work/chores - - Not difficult at all -     Review of Systems  Constitutional: Negative.   HENT: Negative.   Eyes: Negative.   Respiratory: Negative.   Cardiovascular: Negative.   Gastrointestinal: Negative.   Endocrine: Negative.   Genitourinary: Negative.   Musculoskeletal: Negative.   Skin: Negative.   Allergic/Immunologic: Negative.   Neurological: Positive for headaches.  Hematological: Negative.   Psychiatric/Behavioral: Negative.   All other systems reviewed and are negative.      Objective:   Physical Exam  General: No acute distress HEENT: EOMI, oral membranes moist Cards: reg rate  Chest: normal effort Abdomen: Soft, NT, minimally tender.  Abdominal wound is healed with granulation tissue.  The skin has puckered somewhat due to the scar tissue. Skin: dry, intact Extremities: no edema Musculoskeletal:Stump is well formed with 1+ edema still neurological: She is alert. Speech clear LUE with edema  Able to follow basic commands without difficulty.  Motor: RUE: 5/5 proximal to distal LUE: 0/5 proximal to distal with tone and contracture in wrist and fingers--stable examination.  Underlying tone is 2-3 out of  4. RLE: HF, KE 4/5, ADF/PF 4/5 (stable) LLE: Hip flexion, abduction and adduction are grossly 4+ out of 5. skin:  Above-knee wound has healed.   Psychiatric: Her affect is bright      Assessment & Plan:  1. Decreased functional mobility secondary to left AKA10/06/2017 as well as history of CVA with left-sided residual weakness. -Made referral to neuro rehab for pre-prosthetic assessment.  She has very good strength in her left leg and is very motivated to use a prosthesis.  She has already  been up at home and been  active with self-care and chores around the house.  And concerned about her left arm which is unusable for assistance with balancing and gait.            -Recommended that she call processes back to acquire a smaller shrinker to further reduce edema            .  2. Pain Management/headaches:stop gabapentin ( as she's not taking) -Continue Topamax 25-50 mg nightly for migraine prophylaxis.  Stressed to her that this needs to be used on a scheduled basis -She is essentially off of oxycodone 3.Skin/Wound Care:Abdominal wound has healed 8.Abdominal abscesses. Status post laparotomy, lysis of adhesions repair perforated bowel 06/19/2017 at [facility] post percutaneous drain placement October 1 -See above  I will follow-up with patient in about 3 months time.  I spent 15 minutes of direct patient time today reviewing her history, examining and coming up with a custom treatment plan        Assessment & Plan:

## 2018-01-08 DIAGNOSIS — H34811 Central retinal vein occlusion, right eye, with macular edema: Secondary | ICD-10-CM | POA: Diagnosis not present

## 2018-01-11 DIAGNOSIS — I10 Essential (primary) hypertension: Secondary | ICD-10-CM | POA: Diagnosis not present

## 2018-01-11 DIAGNOSIS — E782 Mixed hyperlipidemia: Secondary | ICD-10-CM | POA: Diagnosis not present

## 2018-01-11 DIAGNOSIS — I69354 Hemiplegia and hemiparesis following cerebral infarction affecting left non-dominant side: Secondary | ICD-10-CM | POA: Diagnosis not present

## 2018-01-11 DIAGNOSIS — G43009 Migraine without aura, not intractable, without status migrainosus: Secondary | ICD-10-CM | POA: Diagnosis not present

## 2018-01-11 DIAGNOSIS — K219 Gastro-esophageal reflux disease without esophagitis: Secondary | ICD-10-CM | POA: Diagnosis not present

## 2018-01-11 DIAGNOSIS — E890 Postprocedural hypothyroidism: Secondary | ICD-10-CM | POA: Diagnosis not present

## 2018-01-22 ENCOUNTER — Other Ambulatory Visit: Payer: Self-pay

## 2018-01-22 DIAGNOSIS — I779 Disorder of arteries and arterioles, unspecified: Secondary | ICD-10-CM

## 2018-01-22 DIAGNOSIS — F17200 Nicotine dependence, unspecified, uncomplicated: Secondary | ICD-10-CM

## 2018-01-22 DIAGNOSIS — Z89612 Acquired absence of left leg above knee: Secondary | ICD-10-CM

## 2018-01-23 DIAGNOSIS — H34811 Central retinal vein occlusion, right eye, with macular edema: Secondary | ICD-10-CM | POA: Diagnosis not present

## 2018-01-24 ENCOUNTER — Encounter: Payer: Self-pay | Admitting: Physical Therapy

## 2018-01-24 ENCOUNTER — Ambulatory Visit: Payer: Medicare Other | Admitting: Occupational Therapy

## 2018-01-24 ENCOUNTER — Ambulatory Visit: Payer: Medicare Other | Attending: Family Medicine | Admitting: Physical Therapy

## 2018-01-24 DIAGNOSIS — M6281 Muscle weakness (generalized): Secondary | ICD-10-CM | POA: Insufficient documentation

## 2018-01-24 DIAGNOSIS — M25652 Stiffness of left hip, not elsewhere classified: Secondary | ICD-10-CM | POA: Diagnosis not present

## 2018-01-24 DIAGNOSIS — I69354 Hemiplegia and hemiparesis following cerebral infarction affecting left non-dominant side: Secondary | ICD-10-CM | POA: Insufficient documentation

## 2018-01-24 DIAGNOSIS — R2681 Unsteadiness on feet: Secondary | ICD-10-CM

## 2018-01-25 ENCOUNTER — Telehealth: Payer: Self-pay | Admitting: Physical Therapy

## 2018-01-25 DIAGNOSIS — S78112A Complete traumatic amputation at level between left hip and knee, initial encounter: Secondary | ICD-10-CM | POA: Diagnosis not present

## 2018-01-25 NOTE — Telephone Encounter (Signed)
Dr. Naaman Plummer I evaluated Anna Hayes yesterday for PT. Her prior level of function was community slow but independent with quad cane. Her current functional level assessment indicates K2 basic community level. AmpNoPro score indicates K1 but hemiplegic UE made gait unsafe at pre-prosthetic level so would probably be higher. I simulated standing balance with 29" stool under left ischium and right foot on floor. She could maintain her balance without UE support, scan environment and reach 5" safely. She was referred to me by her PCP. I plan to only see her pre-prosthetically for 2 visits to update her HEP.  Her daughter & she report that they need your authorization to get prescription for a prosthesis. I would recommend a SAFETY (single axis friction engaging) knee with extension assist, single axis foot and silicon liner with velcro strap lanyard suspension. K1 or K2 potential would qualify her for this prosthesis.  Please let me know your concerns as I could assess or address some function during the 2 treatment sessions that are scheduled. Thank you [name], PT, DPT PT Specializing in [place] 01/25/2018@ 5:27 PM Phone:  [phone]  Fax:  [phone] [facility] [address] [place], [place] [address]

## 2018-01-25 NOTE — Therapy (Signed)
[facility] [address] [place], [place], [address] Phone: [phone]   Fax:  [phone]  Occupational Therapy Evaluation  Patient Details  Name: Anna Hayes MRN: [id number] Date of Birth: 1951/02/11 Referring Provider: Dirk Dress MD   Encounter Date: 01/24/2018  OT End of Session - 01/24/18 1356    Visit Number  1    Number of Visits  11    Authorization Type  [employer] Medicare     OT Start Time  1200    OT Stop Time  1230    OT Time Calculation (min)  30 min    Activity Tolerance  Patient tolerated treatment well    Behavior During Therapy  [facility] for tasks assessed/performed       Past Medical History:  Diagnosis Date  . Frequent headaches   . Hypertension   . Muscle pain   . Palpitations   . Septic thrombophlebitis 08/31/2017  . Sinus problem   . Stroke [facility])     Past Surgical History:  Procedure Laterality Date  . AMPUTATION Left 07/05/2017   Procedure: LEFT ABOVE KNEE AMPUTATION;  Surgeon: Conrad [place], MD;  Location: [place];  Service: Vascular;  Laterality: Left;  . EMBOLECTOMY Bilateral 06/27/2017   Procedure: BILATERAL FEMORAL POPLITEAL EMBOLECTOMY;  Surgeon: Serafina Mitchell, MD;  [facility];  Service: Vascular;  Laterality: Bilateral;  . FASCIOTOMY Left 06/27/2017   Procedure: FULL COMPARTMENT LEFT LOWER LEG FASCIOTOMY;  Surgeon: Serafina Mitchell, MD;  Location: [place];  Service: Vascular;  Laterality: Left;  . PATCH ANGIOPLASTY Left 06/27/2017   Procedure: PATCH ANGIOPLASTY Left Posterior Tibial Artery;  Surgeon: Serafina Mitchell, MD;  Location: [place];  Service: Vascular;  Laterality: Left;  . 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 07/11/2017   Procedure: REPAIR OF COMPLEX TRACTION RETINAL DETACHMENT WITH ENDO LASER AND ANTIFUNGAL INJECTION;  Surgeon: Jalene Mullet, MD;  Location: [place];  Service: Ophthalmology;  Laterality: Right;  . TEE WITHOUT CARDIOVERSION N/A 07/11/2017   Procedure: TRANSESOPHAGEAL ECHOCARDIOGRAM (TEE);  Surgeon: Acie Fredrickson Wonda Cheng, MD;  Location: [facility] ENDOSCOPY;  Service: Cardiovascular;  Laterality: N/A;    There were no vitals filed for this visit.  Subjective Assessment - 01/25/18 1240    Subjective   Pt s/p LAKA on 07/05/17, with hx of CVA presents to OT with decreased functional mobility and left hemiplegia, which impedes performance of daily activities    Limitations  LTFA 07/05/17, L hemiplegia from CVA    Patient Stated Goals  return to gardening    Currently in Pain?  No/denies        [facility] OT Assessment - 01/25/18 0001      Assessment   Medical Diagnosis  s/p L TFA, hx of CVA    Referring Provider  Dirk Dress MD    Onset Date/Surgical Date  01/01/18    Prior Therapy  Inpatient rehab, HHPT until March 2019      Precautions   Precautions  Fall      Balance Screen   Has the patient fallen in the past 6 months  No      Home  Environment   Family/patient expects to be discharged to:  Private residence    Living Arrangements  Children    Available Help at Discharge  Family    Type of [place]  One level    Bathroom Shower/Tub  Tub/Shower unit has tub bench, not able to use it yet     Lives With  Family      Prior Function   Level of Independence  Independent did not drive    Vocation  On disability    Leisure  read and garden, cook      ADL   Upper Body Bathing  Set up    Lower Body Bathing  Set up sponge bath, unable to get w/c into bathroom    Upper Body Dressing  Set up    Lower Body Dressing  Set up    Toilet Transfer  Modified independent    Toileting - Clothing Manipulation  Modified independent    Tub/Shower Transfer  -- unable       IADL   Shopping  Needs to be accompanied on any shopping trip    Light Housekeeping  Performs light daily tasks but cannot maintain acceptable level of cleanliness Pt loads dishwasher    Meal Prep  Able to complete simple  warm meal prep at a w/c level, needs education regarding safety    Medication Management  Is responsible for taking medication in correct dosages at correct time    Financial Management  Manages financial matters independently (budgets, writes checks, pays rent, bills goes to bank), collects and keeps track of income      Mobility   Mobility Status  -- modified independent with transfers      Vision - History   Baseline Vision  Wears glasses all the time      Vision Assessment   Vision Assessment  Vision impaired  _ to be further tested in functional context    Comment  Pt is only able to see shapes with       Cognition   Overall Cognitive Status  Within Functional Limits for tasks assessed      Sensation   Light Touch  Appears Intact      ROM / Strength   AROM / PROM / Strength  AROM;PROM      AROM   Overall AROM   Deficits    Overall AROM Comments  no active movement in LUE, RUE grossly WFLs      PROM   Overall PROM   Deficits    Overall PROM Comments  LUE shoulder flexion P/ROM 65 prior to pain, elbow extension -35, fingers fisted at rest, able to be passively extended to 95%                      OT Education - 01/25/18 1317    Education provided  Yes    Education Details  recommendation that pt does not stand at stove for cooking due to risk for falls, recommend cooking at w/c level    Person(s) Educated  Patient;Child(ren)    Methods  Explanation    Comprehension  Verbalized understanding          OT Long Term Goals - 01/25/18 1300      OT LONG TERM GOAL #1   Title  I with HEP    Time  4    Period  Weeks    Status  New    Target Date  02/24/18      OT LONG TERM GOAL #2   Title  I with LUE positioning including splinting PRN to minimize pain and risk for injury    Time  4    Period  Weeks    Status  New      OT LONG TERM GOAL #3   Title  Pt will verbalize understanding of adapted strategies for ADLs/ IADLS and AE to increase pt safety an  independence    Time  4    Period  Weeks    Status  New            Plan - 01/25/18 1247    Clinical Impression Statement  Anna Hayes a 67 y.o.femalewith history of palpitations, PAD, tobacco use, CVA with residual Left HP.Marland Kitchen Patient was who was admitted to [facility] with SBO who underwent lysis of adhesions and repair of perforated bowel 06/19/17. Hospital course complicated by fevers with leucocytosis due to s/p drain placement 10/1, fungemia and development of ischemic LLE due to occlusion of left popliteal and femoral arteries as well as thrombus in right femoral artery. She was started on IV heparin and transferred to [facility] on 10/2 due as patient with significant pain with motor and sensory deficits. She was taken to OR for BLE thrombectomy with left 4 compartment fasciotomy by Dr. Donavan Burnet ischemic changes of left lower extremity and underwent left AKA 07/05/2017 per Dr. Bridgett Larsson.Ophthalmology consulted to 07/07/2017 for suspect fungemis with endophthalamis OD>OS Underwent [place] Vitrectomy, membrane peeling 07/11/2017 per ophthalmology. A TEE was completed showing no vegetation. Abdominal drain removed 07/11/2017.Pt received therapies at [facility] then transitioned to home health therapies following d/c home. Pt presents to occupational therapy with the following deficits, left hemiplegia, pain, spasticity decreased balance, decreased functional mobility and visual impairments which impedes performance of ADLS/IADLS. Pt can benefit from skilled OT to maximize safety and indpendence with daily activities    Occupational Profile and client history currently impacting functional performance  Pt was I with ADLS / IADLS and going to the gym prior to her L AKA. PMH: L AKA, CVA with residual  left hemiplegia, hx of SBO with surgery for perforated bowel, .Ophthalmology consulted to 07/07/2017 for suspect fungemis with endophthalamis OD>OS, Underwent [place] Vitrectomy,  membrane peeling 07/11/2017 per ophthalmology    Occupational performance deficits (Please refer to evaluation for details):  IADL's;ADL's    Rehab Potential  Good    Current Impairments/barriers affecting progress:  Pt does not have a prosthesis yet, dense L hemiplegia    OT Frequency  1x / week anticipate only seeing for 2 visits due to financial concerns    OT Duration  4 weeks    OT Treatment/Interventions  Self-care/ADL training;Therapeutic exercise;Visual/perceptual remediation/compensation;Patient/family education;Splinting;Paraffin;Neuromuscular education;Balance training;Therapeutic activities;Energy conservation;Fluidtherapy;DME and/or AE instruction;Manual Therapy;Passive range of motion    Plan  HEP for LUE, assess positioning needs and education regarding safety for ADLSIADLS until pt gets prosthesis, plan to re-eval after pt gets a prosthesis    Clinical Decision Making  Limited treatment options, no task modification necessary    Consulted and Agree with Plan of Care  Patient;Family member/caregiver       Patient will benefit from skilled therapeutic intervention in order to improve the following deficits and impairments:  Abnormal gait, Decreased knowledge of use of DME, Impaired vision/preception, Decreased coordination, Decreased activity tolerance, Decreased endurance, Decreased range of motion, Decreased strength, Impaired tone, Impaired UE functional use, Impaired perceived functional ability, Difficulty walking, Decreased safety awareness, Decreased balance  Visit Diagnosis: Hemiplegia and hemiparesis following cerebral infarction affecting left non-dominant side ([place]) - Plan: Ot plan of care cert/re-cert  Unsteadiness on feet - Plan: Ot plan of care cert/re-cert  Muscle weakness (generalized) - Plan: Ot plan of care cert/re-cert  Problem List Patient Active Problem List   Diagnosis Date Noted  . Spastic hemiparesis of left nondominant side ([place]) 12/27/2017  .  Blood culture positive for Candida species 08/31/2017  . Septic thrombophlebitis 08/31/2017  . Migraine headache without aura 08/29/2017  . Hypokalemia   . Pain and swelling of right lower leg   . Amputation of left lower extremity above knee upon examination ([place]) 07/12/2017  . Unilateral AKA, left ([place])   . Neuropathic pain   . Fungemia   . Benign essential HTN   . Slow transit constipation   . Intra-abdominal abscess ([place])   . Fungal endophthalmitis   . Pressure injury of skin 07/06/2017  . Pelvic abscess in female [facility])   . Pelvic abscess in female   . DNR (do not resuscitate) discussion   . Palliative care by specialist   . Ischemic neuropathy of left foot   . Abdominal pain   . History of CVA with residual deficit   . Tobacco abuse   . Acute blood loss anemia   . Post-operative pain   . Leukocytosis   . Peripheral artery disease ([place]) 06/27/2017  . Critical lower limb ischemia 06/27/2017  . SBO (small bowel obstruction) ([place]) 06/27/2017  . Intraabdominal fluid collection absess 06/27/2017  . Candidemia ([place]) 06/27/2017  . Stroke ([place])     RINE,KATHRYN 01/25/2018, 1:18 PM Theone Murdoch, OTR/L [phone] Phone: [phone] 1:18 PM 01/25/18 [facility] [address] [place], [place], [address] Phone: [phone]   Fax:  [phone]  Name: Anna Hayes MRN: [id number] Date of Birth: 06-21-1951

## 2018-01-25 NOTE — Therapy (Signed)
[facility] [address] [place], [place], [address] Phone: [phone]   Fax:  [phone]  Physical Therapy Evaluation  Patient Details  Name: Anna Hayes MRN: [id number] Date of Birth: 23-Apr-1951 Referring Provider: Dirk Dress MD   Encounter Date: 01/24/2018  PT End of Session - 01/24/18 1602    Visit Number  1    Number of Visits  3    Authorization Type  [employer] Medicare    PT Start Time  1230    PT Stop Time  1315    PT Time Calculation (min)  45 min    Equipment Utilized During Treatment  Gait belt    Behavior During Therapy  [facility] for tasks assessed/performed       Past Medical History:  Diagnosis Date  . Frequent headaches   . Hypertension   . Muscle pain   . Palpitations   . Septic thrombophlebitis 08/31/2017  . Sinus problem   . Stroke [facility])     Past Surgical History:  Procedure Laterality Date  . AMPUTATION Left 07/05/2017   Procedure: LEFT ABOVE KNEE AMPUTATION;  Surgeon: Conrad Culbertson, MD;  Location: [facility];  Service: Vascular;  Laterality: Left;  . EMBOLECTOMY Bilateral 06/27/2017   Procedure: BILATERAL FEMORAL POPLITEAL EMBOLECTOMY;  Surgeon: Serafina Mitchell, MD;  [facility];  Service: Vascular;  Laterality: Bilateral;  . FASCIOTOMY Left 06/27/2017   Procedure: FULL COMPARTMENT LEFT LOWER LEG FASCIOTOMY;  Surgeon: Serafina Mitchell, MD;  Location: [facility];  Service: Vascular;  Laterality: Left;  . PATCH ANGIOPLASTY Left 06/27/2017   Procedure: PATCH ANGIOPLASTY Left Posterior Tibial Artery;  Surgeon: Serafina Mitchell, MD;  Location: [facility];  Service: Vascular;  Laterality: Left;  . 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 07/11/2017   Procedure: REPAIR OF COMPLEX TRACTION RETINAL DETACHMENT WITH ENDO LASER AND ANTIFUNGAL INJECTION;  Surgeon: Jalene Mullet, MD;  Location: [facility];  Service: Ophthalmology;  Laterality: Right;  . TEE WITHOUT CARDIOVERSION N/A 07/11/2017   Procedure:  TRANSESOPHAGEAL ECHOCARDIOGRAM (TEE);  Surgeon: Acie Fredrickson Wonda Cheng, MD;  Location: [facility] ENDOSCOPY;  Service: Cardiovascular;  Laterality: N/A;    There were no vitals filed for this visit.   Subjective Assessment - 01/24/18 1232    Subjective  This 67yo female was referred on 01/01/2018 by Rochel Brome, MD for PT & OT evaluations following cerebral infarction & left Transfemoral Amputation (07/05/2017). She has small bowel obstruction with ex lap (03/02/3015) complicated by abcess and embolus to left leg. She also had retinal detachment with repair 07/11/2017. Inpatient Rehab 07/12/2017-07/28/2017.     Patient is accompained by:  Family member [name], dtr    Pertinent History  left TFA, HTN, CVA (2003)    Limitations  Lifting;Standing;Walking;House hold activities    Patient Stated Goals  to get a prosthesis and walk.    Currently in Pain?  No/denies         [facility] PT Assessment - 01/24/18 1230      Assessment   Medical Diagnosis  left TFA, hx of CVA    Referring Provider  Dirk Dress, MD    Onset Date/Surgical Date  01/01/18    Hand Dominance  Right    Prior Therapy  Inpatient rehab, HHPT until March 2019      Precautions   Precautions  Fall      Balance Screen   Has the patient fallen in the past 6 months  No    Has the  patient had a decrease in activity level because of a fear of falling?   No    Is the patient reluctant to leave their home because of a fear of falling?   No      Home Environment   Living Environment  Private residence    Living Arrangements  Children adult dtr & 19yo grandson    Type of [place] entrance;Stairs to enter    Entrance Stairs-Number of Steps  5    Entrance Stairs-Rails  None    Home Layout  One level ground floor    Home Equipment  Bedside commode;Tub bench;Grab bars - tub/shower;Wheelchair - manual      Prior Function   Level of Independence  Independent;Independent with household mobility with device;Independent  with community mobility with device;Independent with gait used quad cane, reports slow but could walk community    Vocation  On disability    Leisure  read and garden, Training and development officer, travel      Posture/Postural Control   Posture/Postural Control  Postural limitations    Postural Limitations  Rounded Shoulders;Forward head;Weight shift right      Tone   Assessment Location  Left Lower Extremity      ROM / Strength   AROM / PROM / Strength  AROM;Strength      PROM   Overall PROM   Deficits    Overall PROM Comments  Left hip extension supine -3*, Thomas position -13*      Strength   Overall Strength  Deficits;Within functional limits for tasks performed    Overall Strength Comments  RLE 5/5, Left hip flexion 4/5, abduction 4/5, extension 3/5      Transfers   Transfers  Sit to Stand;Stand to Sit;Stand Pivot Transfers    Sit to Stand  6: Modified independent (Device/Increase time);With upper extremity assist;With armrests;From chair/3-in-1 to sink or stable support    Stand to Sit  6: Modified independent (Device/Increase time);With upper extremity assist;With armrests;To chair/3-in-1 sink or stable support    Stand Pivot Transfers  5: Supervision;With armrests;6: Modified independent (Device/Increase time) Mod indep. armrests removed, Supervision over w/c armrest      Balance   Balance Assessed  Yes      Dynamic Sitting Balance   Dynamic Sitting - Balance Support  No upper extremity supported RLE foot support    Dynamic Sitting - Level of Assistance  6: Modified independent (Device/Increase time)    Reach (Patient is able to reach ___ inches to right, left, forward, back)  10    Dynamic Sitting - Balance Activities  Reaching for objects;Reaching across midline;Trunk control activities      Static Standing Balance   Static Standing - Balance Support  No upper extremity supported;Right upper extremity supported No UE support with left ischium on 19" stool to simulate pro    Static Standing -  Level of Assistance  5: Stand by assistance    Static Standing - Comment/# of Minutes  able to stand without UE support near sink for safety and left ischuim on 19" stool to simulate prosthesis support.       Dynamic Standing Balance   Dynamic Standing - Balance Support  Left upper extremity supported;No upper extremity supported left ischuim on 19" stool to simulate prosthesis support    Dynamic Standing - Level of Assistance  5: Stand by assistance    Dynamic Standing - Balance Activities  Head nods;Head turns;Reaching for objects  Dynamic Standing - Comments  standing with 19" stool under left ischium to simulate prosthesis support:  reaches 5" anteriorly safely, looks over shoulders with trunk rotation to right & to side only to left, looks up & down without assistance with supervision.       LLE Tone   LLE Tone  Within Functional Limits hip only as AKA      Prosthetics Assessment - 01/24/18 1230      Prosthetics   Prosthetic Care Independent with  Skin check;Residual limb care    Edema  none in residual limb    Residual limb condition   no open areas, minimal hair growth    K code/activity level with prosthetic use   K2 basic community with fixed cadence       AMPUTEE MOBILITY PREDICTOR ASSESSMENT TOOL Initial instructions: Client is seated in a hard chair with arms. The following manoeuvres are tested with or without the use of the prosthesis.  Advise the person of each task or group of tasks prior to performance.  Please avoid unnecessary chatter throughout the test.  Safety First, no task should be performed if either the tester or client is uncertain of a safe outcome.  1. Sitting Balance: Sit forward in a chair with arms folded across chest for 60s. Cannot sit upright independently for 60s Can sit upright independently for 60s = 0 = 1  1  2. Sitting reach:  Reach forwards and grasp the ruler.  (Tester holds ruler 12in beyond extended arms midline to the sternum) Does not  attempt Cannot grasp or requires arm support Reaches forward and successfully grasps item.  = 0 = 1  = 2    2  3. Chair to chair transfer: 2 chairs at 72. Pt. may choose direction and use their upper limbs. Cannot do or requires physical assistance Performs independently, but appears unsteady Performs independently, appears to be steady and safe = 0  = 1 = 2  2  4. Arises from a chair: Ask pt. to fold arms across chest and stand. If unable, use arms or assistive device. Unable without help (physical assistance) Able, uses arms/assist device to help Able, without using arms = 0  = 1 = 2   1  5. Attempts to arise from a chair: (stopwatch ready) If attempt in no. 4. was without arms then ignore and allow another attempt without penalty. Unable without help (physical assistance) Able requires >1 attempt Able to rise one attempt = 0  = 1 = 2   2  6. Immediate Standing Balance: (first 5s) Begin timing immediately. Unsteady (staggers, moves foot, sways ) Steady using walking aid or other support Steady without walker or other support = 0 = 1  = 2  1  7. Standing Balance (30s): (stopwatch ready) For item no.'s 7 & 8, first attempt is without assistive device.  If support is required allow after first attempt Unsteady Steady but uses walking aid or other support Standing without support = 0  = 1 = 2    1  8. Single limb standing balance: (stopwatch ready) Time the duration of single limb standing on both the sound and prosthetic limb up to 30s.   Grade the quality, not the time.  *Eliminate item 8 for AMPnoPRO*  Sound side  30 seconds  Prosthetic side na seconds Non-prosthetic side Unsteady Steady but uses walking aid or other support for 30s Single-limb standing without support for 30s  Prosthetic Side Unsteady Steady but  uses walking aid or other support for 30s Single-limb standing without support for 30s  = 0 = 1  = 2    = 0  = 1  = 2     1     na  9. Standing reach: Reach forward and grasp the ruler.  (Tester holds ruler 12in beyond extended arm(s) midline to the sternum) Does not attempt Cannot grasp or requires arm support on assistive device Reaches forward and successfully grasps item no support = 0  = 1  = 2   1  10. Nudge test: With feet as close together as possible, examiner pushes lightly on pt.'s sternum with palm of hand 3 times (toes should rise) Begins to fall Staggers, grabs, catches self ore uses assistive device Steady = 0  = 1 = 2   1  11. Eyes Closed: (at maximum position #7) If support is required grade as unsteady. Unsteady or grips assistive device Steady without any use of assistive device = 0  = 1  1    12. Pick up objects off the floor: Pick up a pencil off the floor placed midline 12in in front of foot. Unable to pick up object and return to standing Performs with some help (table, chair, walking aid etc) Performs independently (without help) = 0  = 1   = 2  0  13. Sitting down:  Ask pt. to fold arms across chest and sit. If unable, use arm or assistive device. Unsafe (misjudged distance, falls into chair ) Uses arms, assistive device or not a smooth motion Safe, smooth motion = 0  = 1 = 2   1  14. Initiation of gait: (immediately after told to "go") Any hesitancy or multiple attempts to start No hesitancy = 0 = 1  0  15. Step length and height: Walk a measured distance of 13ft twice (up and back). Four scores are required or two scores (a. & b.) for each leg. "Marked deviation" is defined as extreme substitute movements to avoid clearing the floor. a. Swing Foot Does not advance a minimum of 12in Advances a minimum of 12in  b. Foot Clearance Foot does not completely clear floor without deviation Foot completely clears floor without marked deviation  = 0  = 1   = 0 = 1 Prosthesis  0   0 Sound  0   0  16. Step Continuity Stopping or  discontinuity between steps (stop & go gait) Steps appear continuous = 0  = 1  0  17. Turning:  180 degree turn when returning to chair. Unable to turn, requires intervention to prevent falling Greater than three steps but completes task without intervention No more than three continuous steps with or without assistive aid = 0  = 1  = 2    0  18. Variable cadence:  Walk a distance of 62ft fast as possible safely 4 times.  (Speeds may vary from slow to fast and fast to slow varying cadence) Unable to vary cadence in a controlled manner Asymmetrical increase in cadence controlled manner Symmetrical increase in speed in a controlled manner  = 0 = 1 = 2      0  19. Stepping over an obstacle: Place a movable box of 4in in height in the walking path. Cannot step over the box Catches foot, interrupts stride Steps over without interrupting stride = 0 = 1 = 2   0  20. Stairs (must have at least 2 steps):  Try to go up and down these stairs without holding on to the railing.  Don't hesitate to permit pt. to hold on to rail.  Safety First, if examiner feels that any risk in involved omit and score as 0.  Ascending Unsteady, cannot do One step at a time, or must hold on to railing or device Step over step, does not hold onto the railing or device  Descending Unsteady, cannot do One step at a time, or must hold on to railing or device Step over step, does not hold onto the railing or device  = 0  = 1 = 2    = 0  = 1 = 2   0     0  21. Assistive device selection:  Add points for the use of an assistive device if used for two or more items.  If testing without prosthesis use of appropriate assistive device is mandatory.   Bed bound Wheelchair / Parallel Bars Walker Crutches (axillary or forearm) Cane (straight or quad) None = 0 = 1 = 2 = 3 = 4 = 5    1    Total Score     AMPnoPRO    16  /43     K LEVEL (converted from AMP score)  AMPnoPRO   K0 =  (0-8)    K1= (9-20)    K2 = (21-28)    K3 = (29-36)    K4 = (37-43)          Objective measurements completed on examination: See above findings.                   PT Long Term Goals - 01/24/18 1311      PT LONG TERM GOAL #1   Title  Patient demonstrates understanding of updated pre-prosthetic HEP.     Time  3    Period  Weeks    Status  New    Target Date  02/16/18             Plan - 01/24/18 1643    Clinical Impression Statement  This 67yo female was functioning with left side hemiparesis from CVA in 2003 at community level with a quad cane. She had small bowel obstuction with surgery complications of embolus that resulted in left Transfermoral Amputation. She is function at w/c level.  Assessment of patient indicates that she has potential to use a prosthesis at basic community level with a quad cane. She has mild left hip flexor tightness that may be premorbid to amputation. She has good function of left hip. Since her hemiplegic LE was the one amputated she would have better rehab potential than if her stronger limb was amputated. She has good standing balance when 29" stool placed under left ischium for LLE support like a prosthesis: she maintains upright without UE support and scans environment safely. Due to hemiplegic LUE and amputated LLE, gait was not safe. AmpNoPro 16/43 indicates K1 ability and would probably have higher score if could ambulate. With current level of function, prior level of function, strength and range indicate patient has potential to function at K2 basic community level. PT recommends ischial containment socket, silicon liner with velcro lanyard, Single Axis Friction Engaging (SAFETY) knee with extension assist and single axis foot.  Patient would benefit from an update to pre-prosthetic HEP to increase level of exercises.      History and Personal Factors relevant to plan of care:  left TFA, HTN, CVA,  Clinical Presentation  Stable     Clinical Decision Making  Low    Rehab Potential  Good    PT Frequency  1x / week    PT Duration  2 weeks    PT Treatment/Interventions  ADLs/Self Care Home Management;DME Instruction;Therapeutic exercise;Patient/family education    PT Next Visit Plan  update pre-prosthetic HEP including modified standing at sink with 29" stool under left ischium    Consulted and Agree with Plan of Care  Patient;Family member/caregiver    Family Member Consulted  daughter       Patient will benefit from skilled therapeutic intervention in order to improve the following deficits and impairments:  Abnormal gait, Decreased balance, Decreased range of motion, Decreased strength, Postural dysfunction  Visit Diagnosis: Stiffness of left hip, not elsewhere classified  Unsteadiness on feet  Muscle weakness (generalized)     Problem List Patient Active Problem List   Diagnosis Date Noted  . Spastic hemiparesis of left nondominant side ([place]) 12/27/2017  . Blood culture positive for Candida species 08/31/2017  . Septic thrombophlebitis 08/31/2017  . Migraine headache without aura 08/29/2017  . Hypokalemia   . Pain and swelling of right lower leg   . Amputation of left lower extremity above knee upon examination ([place]) 07/12/2017  . Unilateral AKA, left ([place])   . Neuropathic pain   . Fungemia   . Benign essential HTN   . Slow transit constipation   . Intra-abdominal abscess ([place])   . Fungal endophthalmitis   . Pressure injury of skin 07/06/2017  . Pelvic abscess in female [facility])   . Pelvic abscess in female   . DNR (do not resuscitate) discussion   . Palliative care by specialist   . Ischemic neuropathy of left foot   . Abdominal pain   . History of CVA with residual deficit   . Tobacco abuse   . Acute blood loss anemia   . Post-operative pain   . Leukocytosis   . Peripheral artery disease ([place]) 06/27/2017  . Critical lower limb ischemia 06/27/2017  . SBO (small bowel obstruction) ([place])  06/27/2017  . Intraabdominal fluid collection absess 06/27/2017  . Candidemia ([place]) 06/27/2017  . Stroke [facility])     [name] PT, DPT 01/25/2018, 5:12 PM  [facility] [address] [place], [place], [address] Phone: [phone]   Fax:  [phone]  Name: Anna Hayes MRN: [id number] Date of Birth: Nov 12, 1950

## 2018-01-25 NOTE — Telephone Encounter (Signed)
I referred her to you at the time of her last OV with me,  12/27/17,  for pre-prosthetic assessment to make sure she would be safe on prosthesis.  Why was PCP referral needed?  She has already been working with Brooke Pace. I will forward your recommendations along and we'll  work toward a prosthesis.   Thanks and Take care ZTS

## 2018-01-26 NOTE — Telephone Encounter (Signed)
Apparently she had 2 referrals for PT. I only saw the most recent when I checked which was from her PCP on 4/8. When I just looked I saw your referral on 4/5. Sorry for the oversight.  I do feel that she is appropriate for a K2 prosthesis based on prior level of function with CVA and current level of function. Brooke Pace and I both feel optimal prosthesis would be SAFETY (Single Axis Friction Engaging) with extension assist and single axis foot. What are your thoughts? If you agree, does she need to return for office visit with you? Thank you [name], PT, DPT

## 2018-01-26 NOTE — Telephone Encounter (Signed)
Sounds very appropriate to me, Robin. Gerald Stabs going to pick up rx today.   Have a great weekend.   Z

## 2018-01-31 ENCOUNTER — Ambulatory Visit: Payer: Medicare Other | Admitting: Physical Therapy

## 2018-01-31 ENCOUNTER — Encounter: Payer: Self-pay | Admitting: Physical Therapy

## 2018-01-31 ENCOUNTER — Ambulatory Visit: Payer: Medicare Other | Admitting: Occupational Therapy

## 2018-01-31 DIAGNOSIS — M6281 Muscle weakness (generalized): Secondary | ICD-10-CM

## 2018-01-31 DIAGNOSIS — I69354 Hemiplegia and hemiparesis following cerebral infarction affecting left non-dominant side: Secondary | ICD-10-CM

## 2018-01-31 DIAGNOSIS — R2681 Unsteadiness on feet: Secondary | ICD-10-CM | POA: Diagnosis not present

## 2018-01-31 DIAGNOSIS — M25652 Stiffness of left hip, not elsewhere classified: Secondary | ICD-10-CM

## 2018-01-31 NOTE — Therapy (Signed)
[facility] [address] [place], [place], [address] Phone: [phone]   Fax:  [phone]  Occupational Therapy Treatment  Patient Details  Name: Anna Hayes MRN: [id number] Date of Birth: 12/18/1950 Referring Provider: Dirk Dress MD   Encounter Date: 01/31/2018  OT End of Session - 01/31/18 1821    Visit Number  2    Number of Visits  11    Authorization Type  UHC Medicare     OT Start Time  4970    OT Stop Time  1145    OT Time Calculation (min)  42 min    Activity Tolerance  Patient tolerated treatment well    Behavior During Therapy  [facility] for tasks assessed/performed       Past Medical History:  Diagnosis Date  . Frequent headaches   . Hypertension   . Muscle pain   . Palpitations   . Septic thrombophlebitis 08/31/2017  . Sinus problem   . Stroke [facility])     Past Surgical History:  Procedure Laterality Date  . AMPUTATION Left 07/05/2017   Procedure: LEFT ABOVE KNEE AMPUTATION;  Surgeon: Conrad [place]town, MD;  Location: [place];  Service: Vascular;  Laterality: Left;  . EMBOLECTOMY Bilateral 06/27/2017   Procedure: BILATERAL FEMORAL POPLITEAL EMBOLECTOMY;  Surgeon: Serafina Mitchell, MD;  [facility];  Service: Vascular;  Laterality: Bilateral;  . FASCIOTOMY Left 06/27/2017   Procedure: FULL COMPARTMENT LEFT LOWER LEG FASCIOTOMY;  Surgeon: Serafina Mitchell, MD;  Location: [place];  Service: Vascular;  Laterality: Left;  . PATCH ANGIOPLASTY Left 06/27/2017   Procedure: PATCH ANGIOPLASTY Left Posterior Tibial Artery;  Surgeon: Serafina Mitchell, MD;  Location: [place];  Service: Vascular;  Laterality: Left;  . 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 07/11/2017   Procedure: REPAIR OF COMPLEX TRACTION RETINAL DETACHMENT WITH ENDO LASER AND ANTIFUNGAL INJECTION;  Surgeon: Jalene Mullet, MD;  Location: [place];  Service: Ophthalmology;  Laterality: Right;  . TEE WITHOUT CARDIOVERSION N/A 07/11/2017   Procedure: TRANSESOPHAGEAL ECHOCARDIOGRAM (TEE);  Surgeon: Acie Fredrickson Wonda Cheng, MD;  Location: [facility] ENDOSCOPY;  Service: Cardiovascular;  Laterality: N/A;    There were no vitals filed for this visit.  Subjective Assessment - 01/31/18 1819    Subjective   Pt denies pain    Limitations  LTFA 07/05/17, L hemiplegia from CVA    Patient Stated Goals  return to gardening    Currently in Pain?  No/denies                 Treatment: Therapist fabricated a custom LUE resting hand splint for improved positioning. Pt verbalizes understanding of splint wear/ care and prec.          OT Education - 01/31/18 1820    Education provided  Yes    Education Details  splint wear, care and precautions    Person(s) Educated  Patient    Methods  Explanation;Demonstration;Verbal cues;Handout    Comprehension  Verbalized understanding;Returned demonstration          OT Long Term Goals - 01/25/18 1300      OT LONG TERM GOAL #1   Title  I with HEP    Time  4    Period  Weeks    Status  New    Target Date  02/24/18      OT LONG TERM GOAL #2   Title  I with LUE positioning including splinting PRN to minimize pain and risk for  injury    Time  4    Period  Weeks    Status  New      OT LONG TERM GOAL #3   Title  Pt will verbalize understanding of adapted strategies for ADLs/ IADLS and AE to increase pt safety an independence    Time  4    Period  Weeks    Status  New            Plan - 01/31/18 1822    Clinical Impression Statement  Pt is progressing towards goals. Pt was fitted with a custom resting hand splint for improved LUE positioning.    Rehab Potential  Good    Current Impairments/barriers affecting progress:  Pt does not have a prosthesis yet, dense L hemiplegia    OT Frequency  1x / week anticipate seein pt only 2x prior to pt receiving prosthesis    OT Duration  4 weeks    OT Treatment/Interventions  Self-care/ADL training;Therapeutic exercise;Visual/perceptual  remediation/compensation;Patient/family education;Splinting;Paraffin;Neuromuscular education;Balance training;Therapeutic activities;Energy conservation;Fluidtherapy;DME and/or AE instruction;Manual Therapy;Passive range of motion    Plan  splint check and modifications, HEP for LUE, safety for ADLs/IADLS, then anticipate placing pt on hold until after she receives prosthesis    Consulted and Agree with Plan of Care  Patient;Family member/caregiver       Patient will benefit from skilled therapeutic intervention in order to improve the following deficits and impairments:  Abnormal gait, Decreased knowledge of use of DME, Impaired vision/preception, Decreased coordination, Decreased activity tolerance, Decreased endurance, Decreased range of motion, Decreased strength, Impaired tone, Impaired UE functional use, Impaired perceived functional ability, Difficulty walking, Decreased safety awareness, Decreased balance  Visit Diagnosis: Hemiplegia and hemiparesis following cerebral infarction affecting left non-dominant side [facility])    Problem List Patient Active Problem List   Diagnosis Date Noted  . Spastic hemiparesis of left nondominant side ([place]) 12/27/2017  . Blood culture positive for Candida species 08/31/2017  . Septic thrombophlebitis 08/31/2017  . Migraine headache without aura 08/29/2017  . Hypokalemia   . Pain and swelling of right lower leg   . Amputation of left lower extremity above knee upon examination ([place]) 07/12/2017  . Unilateral AKA, left ([place])   . Neuropathic pain   . Fungemia   . Benign essential HTN   . Slow transit constipation   . Intra-abdominal abscess ([place])   . Fungal endophthalmitis   . Pressure injury of skin 07/06/2017  . Pelvic abscess in female [facility])   . Pelvic abscess in female   . DNR (do not resuscitate) discussion   . Palliative care by specialist   . Ischemic neuropathy of left foot   . Abdominal pain   . History of CVA with residual deficit   .  Tobacco abuse   . Acute blood loss anemia   . Post-operative pain   . Leukocytosis   . Peripheral artery disease ([place]) 06/27/2017  . Critical lower limb ischemia 06/27/2017  . SBO (small bowel obstruction) ([place]) 06/27/2017  . Intraabdominal fluid collection absess 06/27/2017  . Candidemia ([place]) 06/27/2017  . Stroke ([place])     RINE,KATHRYN 01/31/2018, 6:26 PM Theone Murdoch, OTR/L [phone] Phone: [phone] 6:28 PM 01/31/18 [facility] [address] [place], [place], [address] Phone: [phone]   Fax:  [phone]  Name: Nekita Pita MRN: [id number] Date of Birth: 03-May-1951

## 2018-01-31 NOTE — Therapy (Signed)
[facility] [address] [place], [place], [address] Phone: [phone]   Fax:  [phone]  Physical Therapy Treatment  Patient Details  Name: Anna Hayes MRN: [id number] Date of Birth: 04/17/1951 Referring Provider: Dirk Dress MD   Encounter Date: 01/31/2018  PT End of Session - 01/31/18 2235    Visit Number  2    Number of Visits  3    Authorization Type  UHC Medicare    PT Start Time  1025    PT Stop Time  1103    PT Time Calculation (min)  38 min    Behavior During Therapy  [facility] for tasks assessed/performed       Past Medical History:  Diagnosis Date  . Frequent headaches   . Hypertension   . Muscle pain   . Palpitations   . Septic thrombophlebitis 08/31/2017  . Sinus problem   . Stroke [facility])     Past Surgical History:  Procedure Laterality Date  . AMPUTATION Left 07/05/2017   Procedure: LEFT ABOVE KNEE AMPUTATION;  Surgeon: Conrad [place], MD;  Location: [place];  Service: Vascular;  Laterality: Left;  . EMBOLECTOMY Bilateral 06/27/2017   Procedure: BILATERAL FEMORAL POPLITEAL EMBOLECTOMY;  Surgeon: Serafina Mitchell, MD;  [facility];  Service: Vascular;  Laterality: Bilateral;  . FASCIOTOMY Left 06/27/2017   Procedure: FULL COMPARTMENT LEFT LOWER LEG FASCIOTOMY;  Surgeon: Serafina Mitchell, MD;  Location: [place];  Service: Vascular;  Laterality: Left;  . PATCH ANGIOPLASTY Left 06/27/2017   Procedure: PATCH ANGIOPLASTY Left Posterior Tibial Artery;  Surgeon: Serafina Mitchell, MD;  Location: [place];  Service: Vascular;  Laterality: Left;  . 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 07/11/2017   Procedure: REPAIR OF COMPLEX TRACTION RETINAL DETACHMENT WITH ENDO LASER AND ANTIFUNGAL INJECTION;  Surgeon: Jalene Mullet, MD;  Location: [place];  Service: Ophthalmology;  Laterality: Right;  . TEE WITHOUT CARDIOVERSION N/A 07/11/2017   Procedure: TRANSESOPHAGEAL ECHOCARDIOGRAM (TEE);  Surgeon:  Acie Fredrickson Wonda Cheng, MD;  Location: [facility] ENDOSCOPY;  Service: Cardiovascular;  Laterality: N/A;    There were no vitals filed for this visit.  Subjective Assessment - 01/31/18 1031    Subjective  No falls.     Pertinent History  left TFA, HTN, CVA (2003)    Limitations  Lifting;Standing;Walking;House hold activities    Patient Stated Goals  to get a prosthesis and walk.    Currently in Pain?  No/denies       Patient demonstrated 1-2 reps each of exercises that she is performing: Sitting: active ankle DF/PF, active abduction /adduction RLE & LLE (hips flexed) Supine: R single knee to chest, L hip flexion Sidelying: L hip flex /ext (mid range motion with 1 sec hold) & hip abduction with hip flexed to 90*  Bridging (TFA)    Place a towel roll under amputated limb. Use both legs to lift buttocks. Keep limb in contact with towel roll.  tighten buttock muscles while lifting hips. Hold __3-5__ seconds. Repeat __10__ times. Do _1-2___ sessions per day.  Copyright  VHI. All rights reserved.   Hip / Knee AROM: Abduction / Adduction    Roll to right side. Lift residual limb straight up and down while keeping hip straight. Keep leg back in line with trunk even if don't lift as high. Repeat _10___ times. Do __1-2__ sessions per day.  Copyright  VHI. All rights reserved.   Hip / Knee AROM: Flexion / Extension  Roll to right side. Bring knee to chest while bending knee. Reach limb back as far as possible. Hold 5 seconds while pushing leg back. Repeat _10___ times. Do __1-2__ sessions per day.  Copyright  VHI. All rights reserved.    Hip: Adduction / Abduction    Lay on back with legs out straight (not bent like picture). Place a towel roll between legs and place a solid belt around legs.  Squeeze towel roll for 3-5 seconds. Pull right leg out against belt for 3-5 seconds. Pull left leg out against belt for 3-5 seconds. Repeat __10_ times. Do _1-2__ sessions per day.  Copyright   VHI. All rights reserved.    Hip Extension (Prone)    Lift left leg as high as possible, then lower.  Repeat __10__ times. Do __1-2__ sessions per day.   Copyright  VHI. All rights reserved.    Hip Extension in Half Kneeling modified with w/c.    Face w/c to bed ~1 foot back. Lift right armrest on w/c. Turn to your right. Move left buttock cheek off w/c. Keep trunk upright. Push amputated limb back into extension. Hold 3-5 seconds. Do 10 repetitions.   Copyright  VHI. All rights reserved.                                PT Long Term Goals - 01/24/18 1311      PT LONG TERM GOAL #1   Title  Patient demonstrates understanding of updated pre-prosthetic HEP.     Time  3    Period  Weeks    Status  New    Target Date  02/16/18            Plan - 01/31/18 2236    Clinical Impression Statement  Patient seems to understand updated HEP instructed today. She was very pleased to learn that Dr had written prescription for prosthesis.     Rehab Potential  Good    PT Frequency  1x / week    PT Duration  2 weeks    PT Treatment/Interventions  ADLs/Self Care Home Management;DME Instruction;Therapeutic exercise;Patient/family education    PT Next Visit Plan  check updated pre-prosthetic HEP and add modified standing at sink with 29" stool under left ischium    Consulted and Agree with Plan of Care  Patient;Family member/caregiver    Family Member Consulted  daughter       Patient will benefit from skilled therapeutic intervention in order to improve the following deficits and impairments:  Abnormal gait, Decreased balance, Decreased range of motion, Decreased strength, Postural dysfunction  Visit Diagnosis: Hemiplegia and hemiparesis following cerebral infarction affecting left non-dominant side ([facility])  Unsteadiness on feet  Muscle weakness (generalized)  Stiffness of left hip, not elsewhere classified     Problem List Patient Active  Problem List   Diagnosis Date Noted  . Spastic hemiparesis of left nondominant side ([place]) 12/27/2017  . Blood culture positive for Candida species 08/31/2017  . Septic thrombophlebitis 08/31/2017  . Migraine headache without aura 08/29/2017  . Hypokalemia   . Pain and swelling of right lower leg   . Amputation of left lower extremity above knee upon examination ([place]) 07/12/2017  . Unilateral AKA, left ([place])   . Neuropathic pain   . Fungemia   . Benign essential HTN   . Slow transit constipation   . Intra-abdominal abscess ([place])   . Fungal endophthalmitis   .  Pressure injury of skin 07/06/2017  . Pelvic abscess in female [facility])   . Pelvic abscess in female   . DNR (do not resuscitate) discussion   . Palliative care by specialist   . Ischemic neuropathy of left foot   . Abdominal pain   . History of CVA with residual deficit   . Tobacco abuse   . Acute blood loss anemia   . Post-operative pain   . Leukocytosis   . Peripheral artery disease ([place]) 06/27/2017  . Critical lower limb ischemia 06/27/2017  . SBO (small bowel obstruction) ([place]) 06/27/2017  . Intraabdominal fluid collection absess 06/27/2017  . Candidemia ([place]) 06/27/2017  . Stroke [facility])     [name] PT, DPT 01/31/2018, 10:40 PM  [facility] [address] [place], [place], [address] Phone: [phone]   Fax:  [phone]  Name: Ardenia Stiner MRN: [id number] Date of Birth: 1951-05-21

## 2018-01-31 NOTE — Patient Instructions (Signed)
Your Splint This splint should initially be fitted by a healthcare practitioner.  The healthcare practitioner is responsible for providing wearing instructions and precautions to the patient, other healthcare practitioners and care provider involved in the patient's care.  This splint was custom made for you. Please read the following instructions to learn about wearing and caring for your splint.  Precautions Should your splint cause any of the following problems, remove the splint immediately and contact your therapist/physician.  Swelling  Severe Pain  Pressure Areas  Stiffness  Numbness  Do not wear your splint while operating machinery unless it has been fabricated for that purpose.  When To Wear Your Splint Where your splint according to your therapist/physician instructions. Daytime for 1 hours then check skin for pressure areas and redness if no problems wear splint for 2-3 hours at a time during daytime, always monitor your skin when you remove splint, STOP wearing splint if any problems, after 1 week of wearing splint during daytime, if no problems progress to night time wear,  Care and Cleaning of Your Splint 1. Keep your splint away from open flames. 2. Your splint will lose its shape in temperatures over 135 degrees Farenheit, ( in car windows, near radiators, ovens or in hot water).  Never make any adjustments to your splint, if the splint needs adjusting remove it and make an appointment to see your therapist. 3. Your splint, including the cushion liner may be cleaned with soap and lukewarm water.  Do not immerse in hot water over 135 degrees Farenheit. 4. Straps may be washed with soap and water, but do not moisten the self-adhesive portion. 5. For ink or hard to remove spots use a scouring cleanser which contains chlorine.  Rinse the splint thoroughly after using chlorine cleanser.

## 2018-01-31 NOTE — Patient Instructions (Addendum)
Sitting: active ankle DF/PF, abduction /adduction RLE & LLE,  Supine R single knee to chest, L hip flexion Sidelying: L hip flex /ext & hip abduction  Bridging (TFA)    With sound knee bent and foot flat, tighten buttock muscles while lifting hips. Hold ____ seconds. Repeat ____ times. Do ____ sessions per day.  Copyright  VHI. All rights reserved.  Hip / Knee AROM: Abduction / Adduction    Roll to sound side. Lift residual limb straight up and down while keeping hip straight. Repeat ____ times. Do ____ sessions per day.  Copyright  VHI. All rights reserved.  Hip / Knee AROM: Flexion / Extension    Roll to sound side. Bring knee to chest while bending knee. Reach limb back as far as possible while straightening knee. Repeat ____ times. Do ____ sessions per day.  Copyright  VHI. All rights reserved.  Pelvic Squeeze / Protected Hip: Adduction / Abduction    Get ON TARGET. Lie on back, horizontal flat down roller under pelvis, ball strapped between legs.Contract inner muscles to set pelvis. Push right knee into ball. Pull right knee outward. Hold ___ seconds. Repeat ___ times. Do ___ sessions per day.  Copyright  VHI. All rights reserved.  Hip Extension (Prone)    Lift one leg as high as possible, then lower. Repeat with other leg. Repeat ____ times. Do ____ sessions per day.  http://gt2.exer.us/636   Copyright  VHI. All rights reserved.  DEVELOPMENTAL POSITION: Tall Kneeling to Half Kneeling    From tall kneeling position, shift weight to one side. Bring opposite leg forward, place foot flat on surface. ___ reps per set, ___ sets per day, ___ days per week Repeat with other leg.  Copyright  VHI. All rights reserved.

## 2018-02-06 ENCOUNTER — Ambulatory Visit: Payer: Medicare Other | Admitting: Occupational Therapy

## 2018-02-06 ENCOUNTER — Ambulatory Visit: Payer: Medicare Other | Admitting: Physical Therapy

## 2018-02-06 ENCOUNTER — Encounter: Payer: Self-pay | Admitting: Physical Therapy

## 2018-02-06 ENCOUNTER — Encounter: Payer: Self-pay | Admitting: Occupational Therapy

## 2018-02-06 DIAGNOSIS — M6281 Muscle weakness (generalized): Secondary | ICD-10-CM

## 2018-02-06 DIAGNOSIS — R2681 Unsteadiness on feet: Secondary | ICD-10-CM

## 2018-02-06 DIAGNOSIS — I69354 Hemiplegia and hemiparesis following cerebral infarction affecting left non-dominant side: Secondary | ICD-10-CM | POA: Diagnosis not present

## 2018-02-06 DIAGNOSIS — M25652 Stiffness of left hip, not elsewhere classified: Secondary | ICD-10-CM | POA: Diagnosis not present

## 2018-02-06 NOTE — Therapy (Signed)
[facility] [address] [place], [place], [address] Phone: [phone]   Fax:  [phone]  Occupational Therapy Treatment  Patient Details  Name: Anna Hayes MRN: [id number] Date of Birth: 24-Jun-1951 Referring Provider: Dirk Dress MD   Encounter Date: 02/06/2018  OT End of Session - 02/06/18 1526    Visit Number  3    Authorization Type  [employer] Medicare     OT Start Time  6144    OT Stop Time  1525    OT Time Calculation (min)  38 min    Activity Tolerance  Patient tolerated treatment well    Behavior During Therapy  [facility] for tasks assessed/performed       Past Medical History:  Diagnosis Date  . Frequent headaches   . Hypertension   . Muscle pain   . Palpitations   . Septic thrombophlebitis 08/31/2017  . Sinus problem   . Stroke [facility])     Past Surgical History:  Procedure Laterality Date  . AMPUTATION Left 07/05/2017   Procedure: LEFT ABOVE KNEE AMPUTATION;  Surgeon: Anna Woodstock, MD;  Location: [place];  Service: Vascular;  Laterality: Left;  . EMBOLECTOMY Bilateral 06/27/2017   Procedure: BILATERAL FEMORAL POPLITEAL EMBOLECTOMY;  Surgeon: Anna Mitchell, MD;  [facility];  Service: Vascular;  Laterality: Bilateral;  . FASCIOTOMY Left 06/27/2017   Procedure: FULL COMPARTMENT LEFT LOWER LEG FASCIOTOMY;  Surgeon: Anna Mitchell, MD;  Location: [place];  Service: Vascular;  Laterality: Left;  . PATCH ANGIOPLASTY Left 06/27/2017   Procedure: PATCH ANGIOPLASTY Left Posterior Tibial Artery;  Surgeon: Anna Mitchell, MD;  Location: [place];  Service: Vascular;  Laterality: Left;  . 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 07/11/2017   Procedure: REPAIR OF COMPLEX TRACTION RETINAL DETACHMENT WITH ENDO LASER AND ANTIFUNGAL INJECTION;  Surgeon: Anna Mullet, MD;  Location: [place];  Service: Ophthalmology;  Laterality: Right;  . TEE WITHOUT CARDIOVERSION N/A 07/11/2017   Procedure:  TRANSESOPHAGEAL ECHOCARDIOGRAM (TEE);  Surgeon: Anna Fredrickson Wonda Cheng, MD;  Location: [facility] END[facility]COPY;  Service: Cardiovascular;  Laterality: N/A;    There were no vitals filed for this visit.  Subjective Assessment - 02/06/18 1454    Subjective   I feel good about being able to get up and down by myself.      Limitations  LTFA 07/05/17, L hemiplegia from CVA    Patient Stated Goals  return to gardening    Currently in Pain?  No/denies                   OT Treatments/Exercises (OP) - 02/06/18 0001      Neurological Re-education Exercises   Other Exercises 1  Reviewed HEP for passive range of motion for elbow and digits.  Added stretches to address shoulder range of motion and wrist range of motion.        Splinting   Splinting  Splint check.  Patient able to don her own splint.  She had questions relating to the thumb.  Reviewed process - no further modifications warranted.               OT Education - 02/06/18 1525    Education provided  Yes    Education Details  HEP Shoulder, elbow, wrist and digit range of motion    Person(s) Educated  Patient;Child(ren)    Methods  Explanation;Demonstration;Handout    Comprehension  Verbalized understanding  OT Long Term Goals - 02/06/18 1528      OT LONG TERM GOAL #1   Title  I with HEP    Status  On-going      OT LONG TERM GOAL #2   Title  I with LUE positioning including splinting PRN to minimize pain and risk for injury    Status  On-going      OT LONG TERM GOAL #3   Title  Pt will verbalize understanding of adapted strategies for ADLs/ IADLS and AE to increase pt safety an independence    Status  On-going            Plan - 02/06/18 1526    Clinical Impression Statement  Patient is agreeable to holding further OT services until she receives her prosthesis.      Occupational Profile and client history currently impacting functional performance  Pt was I with ADLS / IADLS and going to the gym prior to  her L AKA. PMH: L AKA, CVA with residual  left hemiplegia, hx of SBO with surgery for perforated bowel, .Ophthalmology consulted to 07/07/2017 for suspect fungemis with endophthalamis OD>OS, Underwent [place] Vitrectomy, membrane peeling 07/11/2017 per ophthalmology    Occupational performance deficits (Please refer to evaluation for details):  IADL's;ADL's    Rehab Potential  Good    Current Impairments/barriers affecting progress:  Pt does not have a prosthesis yet, dense L hemiplegia    OT Frequency  1x / week    OT Duration  4 weeks    OT Treatment/Interventions  Self-care/ADL training;Therapeutic exercise;Visual/perceptual remediation/compensation;Patient/family education;Splinting;Paraffin;Neuromuscular education;Balance training;Therapeutic activities;Energy conservation;Fluidtherapy;DME and/or AE instruction;Manual Therapy;Passive range of motion    Plan  place on hold, review HEP     Clinical Decision Making  Limited treatment options, no task modification necessary    Consulted and Agree with Plan of Care  Patient;Family member/caregiver    Family Member Consulted  Daughter Anna Hayes       Patient will benefit from skilled therapeutic intervention in order to improve the following deficits and impairments:  Abnormal gait, Decreased knowledge of use of DME, Impaired vision/preception, Decreased coordination, Decreased activity tolerance, Decreased endurance, Decreased range of motion, Decreased strength, Impaired tone, Impaired UE functional use, Impaired perceived functional ability, Difficulty walking, Decreased safety awareness, Decreased balance  Visit Diagnosis: Hemiplegia and hemiparesis following cerebral infarction affecting left non-dominant side (HCC)  Unsteadiness on feet  Muscle weakness (generalized)    Problem List Patient Active Problem List   Diagnosis Date Noted  . Spastic hemiparesis of left nondominant side ([place]) 12/27/2017  . Blood culture positive for Candida  species 08/31/2017  . Septic thrombophlebitis 08/31/2017  . Migraine headache without aura 08/29/2017  . Hypokalemia   . Pain and swelling of right lower leg   . Amputation of left lower extremity above knee upon examination ([place]) 07/12/2017  . Unilateral AKA, left ([place])   . Neuropathic pain   . Fungemia   . Benign essential HTN   . Slow transit constipation   . Intra-abdominal abscess ([place])   . Fungal endophthalmitis   . Pressure injury of skin 07/06/2017  . Pelvic abscess in female [facility])   . Pelvic abscess in female   . DNR (do not resuscitate) discussion   . Palliative care by specialist   . Ischemic neuropathy of left foot   . Abdominal pain   . History of CVA with residual deficit   . Tobacco abuse   . Acute blood loss anemia   .  Post-operative pain   . Leukocytosis   . Peripheral artery disease ([place]) 06/27/2017  . Critical lower limb ischemia 06/27/2017  . SBO (small bowel obstruction) ([place]) 06/27/2017  . Intraabdominal fluid collection absess 06/27/2017  . Candidemia ([place]) 06/27/2017  . Stroke [facility])     Mariah Milling, OTR/L 02/06/2018, 3:30 PM  [facility] [address] [place], [place], [address] Phone: [phone]   Fax:  [phone]  Name: Anna Hayes MRN: [id number] Date of Birth: [birth year]

## 2018-02-06 NOTE — Patient Instructions (Signed)
Flexion (Passive)   Seated at [place] wheelchair up close to table.  Place both elbows on table.   Disregard picture above.  Use right hand to GENTLY move left hand forward.  Motion should be comfortable and pain free. More important to move arm and keep body still, than move body.   10 repetitions straight forward, 10 to left, 10 to right.   Repeat ____ times. Do ____ sessions per day.  Copyright  VHI. All rights reserved.

## 2018-02-06 NOTE — Patient Instructions (Signed)
Seated on bar stool at sink (have majority of the stool under your left buttock): use right arm/hand for support.  1. Lean forward at hips and then back up into tall posture. 10 reps.  2. With right arm at side- small lean down toward right side and then back up. ++only do this one when your daughter is with you to stabilize the stool and offer balance assistance as needed.  3. With right hand on center of sink: turn and look over your shoulder on the right, then on the left side, alternating sides. 10 reps each way.   Perform this 3-4 days a week.

## 2018-02-07 NOTE — Therapy (Signed)
[facility] [address] [place], [place], [address] Phone: [phone]   Fax:  [phone]  Physical Therapy Treatment  Patient Details  Name: Anna Hayes MRN: [id number] Date of Birth: 06-12-1951 Referring Provider: Dirk Dress MD   Encounter Date: 02/06/2018  PT End of Session - 02/06/18 1535    Visit Number  3    Number of Visits  3    Authorization Type  [employer] Medicare    PT Start Time  0762    PT Stop Time  1613    PT Time Calculation (min)  40 min    Equipment Utilized During Treatment  Gait belt    Activity Tolerance  Patient tolerated treatment well;Patient limited by fatigue    Behavior During Therapy  [facility] for tasks assessed/performed       Past Medical History:  Diagnosis Date  . Frequent headaches   . Hypertension   . Muscle pain   . Palpitations   . Septic thrombophlebitis 08/31/2017  . Sinus problem   . Stroke [facility])     Past Surgical History:  Procedure Laterality Date  . AMPUTATION Left 07/05/2017   Procedure: LEFT ABOVE KNEE AMPUTATION;  Surgeon: [place], MD;  Location: [place];  Service: Vascular;  Laterality: Left;  . EMBOLECTOMY Bilateral 06/27/2017   Procedure: BILATERAL FEMORAL POPLITEAL EMBOLECTOMY;  Surgeon: Serafina Mitchell, MD;  [facility];  Service: Vascular;  Laterality: Bilateral;  . FASCIOTOMY Left 06/27/2017   Procedure: FULL COMPARTMENT LEFT LOWER LEG FASCIOTOMY;  Surgeon: Serafina Mitchell, MD;  Location: [place];  Service: Vascular;  Laterality: Left;  . PATCH ANGIOPLASTY Left 06/27/2017   Procedure: PATCH ANGIOPLASTY Left Posterior Tibial Artery;  Surgeon: Serafina Mitchell, MD;  Location: [place];  Service: Vascular;  Laterality: Left;  . 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 07/11/2017   Procedure: REPAIR OF COMPLEX TRACTION RETINAL DETACHMENT WITH ENDO LASER AND ANTIFUNGAL INJECTION;  Surgeon: Jalene Mullet, MD;  Location: [place];  Service:  Ophthalmology;  Laterality: Right;  . TEE WITHOUT CARDIOVERSION N/A 07/11/2017   Procedure: TRANSESOPHAGEAL ECHOCARDIOGRAM (TEE);  Surgeon: Acie Fredrickson Wonda Cheng, MD;  Location: [facility] ENDOSCOPY;  Service: Cardiovascular;  Laterality: N/A;    There were no vitals filed for this visit.  Subjective Assessment - 02/06/18 1534    Subjective  No new complaints. No falls or pain to report. Has done the HEP issued at last session, no issues to report.     Patient is accompained by:  Family member Anna Hayes, daughter    Pertinent History  left TFA, HTN, CVA (2003)    Limitations  Lifting;Standing;Walking;House hold activities    Patient Stated Goals  to get a prosthesis and walk.    Currently in Pain?  No/denies           [facility] Adult PT Treatment/Exercise - 02/06/18 1551      Neuro Re-ed    Neuro Re-ed Details   seated on stool at sink (stool mostly under left buttock): forward leaning at hips/back up tall, lateral reaching to floor with right UE and back up, alternating looking over each shoulder. cues needed on posture and ex form/technique. issued to HEP.                                Exercises   Other Exercises   on mat table: reviewed components of HEP issued at last  session- isometric hip strengthening with belt/towel and bridge ex's with cues on correct hold times/abdominal activation; attempted to get into prone for prone ex, increased time with eventually second persion and wedge under pt's chest/abodomen needed to get prone without hurting left arm. performed hip extension against gravity for 10 reps. removed from HEP due to increased difficulty to get into prone.                                       PT Education - 02/06/18 2255    Education provided  Yes    Education Details  HEP seated on stool; removed prone ex due to UE pain in this position    Person(s) Educated  Patient;Child(ren)    Methods  Explanation;Demonstration;Tactile cues;Verbal cues;Handout    Comprehension   Verbalized understanding;Returned demonstration          PT Long Term Goals - 01/24/18 1311      PT LONG TERM GOAL #1   Title  Patient demonstrates understanding of updated pre-prosthetic HEP.     Time  3    Period  Weeks    Status  New    Target Date  02/16/18            Plan - 02/06/18 1535    Clinical Impression Statement  Today's skilled session focused on finializing pt's HEP for home. Pt on hold after today until she obtains her prosthesis. Pt is progressing toward goals and should benefit from continued PT to progress toward unmet goals.    Rehab Potential  Good    PT Frequency  1x / week    PT Duration  2 weeks    PT Treatment/Interventions  ADLs/Self Care Home Management;DME Instruction;Therapeutic exercise;Patient/family education    PT Next Visit Plan  pt on hold until she obtains her prosthesis and will call ato schedule at that time both OT/PT appt's.; to be re-evaled by PT with goals updated at that time    Consulted and Agree with Plan of Care  Patient;Family member/caregiver    Family Member Consulted  daughter       Patient will benefit from skilled therapeutic intervention in order to improve the following deficits and impairments:  Abnormal gait, Decreased balance, Decreased range of motion, Decreased strength, Postural dysfunction  Visit Diagnosis: Hemiplegia and hemiparesis following cerebral infarction affecting left non-dominant side (HCC)  Unsteadiness on feet  Muscle weakness (generalized)     Problem List Patient Active Problem List   Diagnosis Date Noted  . Spastic hemiparesis of left nondominant side ([place]) 12/27/2017  . Blood culture positive for Candida species 08/31/2017  . Septic thrombophlebitis 08/31/2017  . Migraine headache without aura 08/29/2017  . Hypokalemia   . Pain and swelling of right lower leg   . Amputation of left lower extremity above knee upon examination ([place]) 07/12/2017  . Unilateral AKA, left ([place])   .  Neuropathic pain   . Fungemia   . Benign essential HTN   . Slow transit constipation   . Intra-abdominal abscess ([place])   . Fungal endophthalmitis   . Pressure injury of skin 07/06/2017  . Pelvic abscess in female [facility])   . Pelvic abscess in female   . DNR (do not resuscitate) discussion   . Palliative care by specialist   . Ischemic neuropathy of left foot   . Abdominal pain   . History of CVA with residual  deficit   . Tobacco abuse   . Acute blood loss anemia   . Post-operative pain   . Leukocytosis   . Peripheral artery disease ([place]) 06/27/2017  . Critical lower limb ischemia 06/27/2017  . SBO (small bowel obstruction) ([place]) 06/27/2017  . Intraabdominal fluid collection absess 06/27/2017  . Candidemia ([place]) 06/27/2017  . Stroke [facility])     Willow Ora, PTA, [facility] [address] [place], [place] [address] [phone] 02/07/18, 10:56 PM   Name: Kjerstin Abrigo MRN: [id number] Date of Birth: 31-Aug-1951

## 2018-02-15 ENCOUNTER — Ambulatory Visit (HOSPITAL_COMMUNITY): Payer: Medicare Other | Attending: Family

## 2018-02-15 ENCOUNTER — Ambulatory Visit: Payer: Medicare Other | Admitting: Family

## 2018-02-15 ENCOUNTER — Ambulatory Visit (HOSPITAL_COMMUNITY): Payer: Medicare Other

## 2018-02-25 DIAGNOSIS — S78112A Complete traumatic amputation at level between left hip and knee, initial encounter: Secondary | ICD-10-CM | POA: Diagnosis not present

## 2018-02-27 DIAGNOSIS — H34811 Central retinal vein occlusion, right eye, with macular edema: Secondary | ICD-10-CM | POA: Diagnosis not present

## 2018-03-09 DIAGNOSIS — Z89612 Acquired absence of left leg above knee: Secondary | ICD-10-CM | POA: Diagnosis not present

## 2018-03-21 ENCOUNTER — Encounter: Payer: Medicare Other | Admitting: Physical Medicine & Rehabilitation

## 2018-03-27 DIAGNOSIS — S78112A Complete traumatic amputation at level between left hip and knee, initial encounter: Secondary | ICD-10-CM | POA: Diagnosis not present

## 2018-04-10 ENCOUNTER — Ambulatory Visit (HOSPITAL_BASED_OUTPATIENT_CLINIC_OR_DEPARTMENT_OTHER): Payer: Medicare Other | Admitting: Physical Therapy

## 2018-04-10 ENCOUNTER — Ambulatory Visit: Payer: Medicare Other | Admitting: Physical Therapy

## 2018-04-10 ENCOUNTER — Ambulatory Visit: Payer: Medicare Other | Attending: Physical Medicine & Rehabilitation | Admitting: Physical Therapy

## 2018-04-10 ENCOUNTER — Encounter: Payer: Self-pay | Admitting: Physical Therapy

## 2018-04-10 ENCOUNTER — Other Ambulatory Visit: Payer: Self-pay

## 2018-04-10 ENCOUNTER — Encounter: Payer: Medicare Other | Admitting: Physical Therapy

## 2018-04-10 ENCOUNTER — Telehealth: Payer: Self-pay | Admitting: Physical Therapy

## 2018-04-10 DIAGNOSIS — M25652 Stiffness of left hip, not elsewhere classified: Secondary | ICD-10-CM | POA: Insufficient documentation

## 2018-04-10 DIAGNOSIS — R2681 Unsteadiness on feet: Secondary | ICD-10-CM | POA: Insufficient documentation

## 2018-04-10 DIAGNOSIS — I739 Peripheral vascular disease, unspecified: Secondary | ICD-10-CM

## 2018-04-10 DIAGNOSIS — R2689 Other abnormalities of gait and mobility: Secondary | ICD-10-CM | POA: Insufficient documentation

## 2018-04-10 DIAGNOSIS — S78112A Complete traumatic amputation at level between left hip and knee, initial encounter: Secondary | ICD-10-CM

## 2018-04-10 DIAGNOSIS — I69354 Hemiplegia and hemiparesis following cerebral infarction affecting left non-dominant side: Secondary | ICD-10-CM | POA: Insufficient documentation

## 2018-04-10 DIAGNOSIS — M6281 Muscle weakness (generalized): Secondary | ICD-10-CM

## 2018-04-10 DIAGNOSIS — R293 Abnormal posture: Secondary | ICD-10-CM | POA: Diagnosis not present

## 2018-04-10 NOTE — Therapy (Signed)
[facility] [address] [place], [place], [address] Phone: [phone]   Fax:  [phone]  Physical Therapy Evaluation  Patient Details  Name: Anna Hayes MRN: [id number] Date of Birth: 1950-11-17 Referring Provider: Alger Simons, MD   Encounter Date: 04/10/2018  PT End of Session - 04/10/18 2151    Visit Number  1    Number of Visits  25    Date for PT Re-Evaluation  07/09/18    Authorization Type  [employer] Medicare    PT Start Time  0930    PT Stop Time  1015    PT Time Calculation (min)  45 min    Equipment Utilized During Treatment  Gait belt    Activity Tolerance  Patient tolerated treatment well    Behavior During Therapy  [facility] for tasks assessed/performed       Past Medical History:  Diagnosis Date  . Frequent headaches   . Hypertension   . Muscle pain   . Palpitations   . Septic thrombophlebitis 08/31/2017  . Sinus problem   . Stroke [facility])     Past Surgical History:  Procedure Laterality Date  . AMPUTATION Left 07/05/2017   Procedure: LEFT ABOVE KNEE AMPUTATION;  Surgeon: Conrad , MD;  Location: [facility];  Service: Vascular;  Laterality: Left;  . EMBOLECTOMY Bilateral 06/27/2017   Procedure: BILATERAL FEMORAL POPLITEAL EMBOLECTOMY;  Surgeon: Serafina Mitchell, MD;  Location: [facility];  Service: Vascular;  Laterality: Bilateral;  . FASCIOTOMY Left 06/27/2017   Procedure: FULL COMPARTMENT LEFT LOWER LEG FASCIOTOMY;  Surgeon: Serafina Mitchell, MD;  Location: [place];  Service: Vascular;  Laterality: Left;  . PATCH ANGIOPLASTY Left 06/27/2017   Procedure: PATCH ANGIOPLASTY Left Posterior Tibial Artery;  Surgeon: Serafina Mitchell, MD;  Location: [place];  Service: Vascular;  Laterality: Left;  . 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 07/11/2017   Procedure: REPAIR OF COMPLEX TRACTION RETINAL DETACHMENT WITH ENDO LASER AND ANTIFUNGAL INJECTION;  Surgeon: Jalene Mullet, MD;  Location: [place];   Service: Ophthalmology;  Laterality: Right;  . TEE WITHOUT CARDIOVERSION N/A 07/11/2017   Procedure: TRANSESOPHAGEAL ECHOCARDIOGRAM (TEE);  Surgeon: Acie Fredrickson Wonda Cheng, MD;  Location: [facility] ENDOSCOPY;  Service: Cardiovascular;  Laterality: N/A;    There were no vitals filed for this visit.   Subjective Assessment - 04/10/18 2122    Subjective  This 67yo female underwent a left Transfemoral Amputation on 07/05/2017. She has small bowel obstruction with ex lap on 12/23/5186 complicated by abcess and embolus to left leg. She also had retinal detachment with repair 07/11/2017. She was functioning at community level with hemiwalker with left hemiparesis from CVA in 2003. Prosthesis delivered 03/09/2018.    Patient is accompained by:  Family member daughter, [name]    Pertinent History  left TFA, HTN, CVA (2003)    Limitations  Lifting;Standing;Walking;House hold activities    Patient Stated Goals  to use prosthesis to return to community & household mobility    Currently in Pain?  No/denies         [facility] PT Assessment - 04/10/18 0930      Assessment   Medical Diagnosis  Left Transfemoral Amputation, hx of CVA with left spastic hemiparesis    Referring Provider  Alger Simons, MD    Onset Date/Surgical Date  03/09/18 prosthesis delivery    Hand Dominance  Right    Prior Therapy  Inpatient, HHPT & 3 OPPT visits pre-prosthetic only  Precautions   Precautions  Fall      Restrictions   Weight Bearing Restrictions  No      Balance Screen   Has the patient fallen in the past 6 months  No    Has the patient had a decrease in activity level because of a fear of falling?   No    Is the patient reluctant to leave their home because of a fear of falling?   No      Home Environment   Living Environment  Private residence    Living Arrangements  Children    Type of [place] entrance;Stairs to enter    Entrance Stairs-Number of Steps  5    Entrance Stairs-Rails  None     Home Layout  One level ground floor    Home Equipment  Bedside commode;Tub bench;Grab bars - tub/shower;Wheelchair - manual;Other (comment) hemiwalker      Prior Function   Level of Independence  Independent;Independent with household mobility with device;Independent with community mobility with device    Vocation  On disability    Leisure  read and garden, cook      Posture/Postural Control   Posture/Postural Control  Postural limitations    Postural Limitations  Rounded Shoulders;Forward head;Increased lumbar lordosis;Flexed trunk;Weight shift right      Tone   Assessment Location  Left Upper Extremity      PROM   Overall PROM Comments  left hip extension -13* standing      Strength   Overall Strength  Deficits;Within functional limits for tasks performed    Overall Strength Comments  RLE grossly 5/5 tested in sitting, Left hip flexion 4/5, abduction 3-/5 & extension 3-/5 functional testing.       Transfers   Transfers  Sit to Stand;Stand to Sit    Sit to Stand  4: Min assist;From elevated surface;With upper extremity assist;With armrests;From chair/3-in-1 to parallel bar, PT manually controlling prosthesis    Stand to Sit  4: Min assist;With upper extremity assist;With armrests;To elevated surface;To chair/3-in-1 from parallel bar, PT manually controlling prosthesis      Ambulation/Gait   Ambulation/Gait  Yes    Ambulation/Gait Assistance  2: Max assist;3: Mod assist initially maxA, improved to [place] with sequence change    Ambulation/Gait Assistance Details  initially sequencing advancing prosthesis first & manual prosthetic knee stabilization and wt shift over prosthesis;  PT swtiched to advancing non-amputated limb first to stabilize prosthetic knee (stance control friction engaging SAFETY knee with extension assist) with prosthesis to step-to of prosthesis    Ambulation Distance (Feet)  5 Feet 5' X 4    Assistive device  Parallel bars;Prosthesis left Transfemoral prosthesis  SAFETY knee w/ extension assist    Gait Pattern  Step-to pattern;Decreased stance time - left;Decreased hip/knee flexion - left;Decreased weight shift to left;Left hip hike;Lateral hip instability;Trunk flexed;Poor foot clearance - left    Ambulation Surface  Indoor;Level      Static Standing Balance   Static Standing - Balance Support  Right upper extremity supported parallel bar support    Static Standing - Level of Assistance  5: Stand by assistance once PT positions prosthesis in point of stability    Static Standing Balance -  Activities   Romberg - Eyes Opened    Static Standing - Comment/# of Minutes  1 minute      Dynamic Standing Balance   Dynamic Standing - Balance Support  Left upper  extremity supported parallel bar support    Dynamic Standing - Level of Assistance  4: Min assist    Dynamic Standing - Balance Activities  Eyes opn;Head nods;Head turns    Dynamic Standing - Comments  turns head only (cervical no thoracic /trunk movement) looking right /left and up/down.      LUE Tone   LUE Tone  Hypertonic;Severe;Modified Ashworth      LUE Tone   Modified Ashworth Scale for Grading Hypertonia LUE  Considerable increase in muschle tone, passive movement difficult      Prosthetics Assessment - 04/10/18 0930      Prosthetics   Prosthetic Care Dependent with  Skin check;Residual limb care;Care of non-amputated limb;Prosthetic cleaning;Ply sock cleaning;Correct ply sock adjustment;Proper wear schedule/adjustment;Proper weight-bearing schedule/adjustment    Donning prosthesis   Max assist    Doffing prosthesis   Min assist    Current prosthetic wear tolerance (days/week)   worn 6 of 32 days since delivery    Current prosthetic wear tolerance (#hours/day)   reports wear 15 - 45 minutes sitting only    Current prosthetic weight-bearing tolerance (hours/day)   No c/o pain or discomfort in limb with standing or gait with partial weight on prosthesis    Edema  none in residual limb     Residual limb condition   no open areas, minimal hair growth, dry skin,     K code/activity level with prosthetic use   K2 basic community with fixed cadence               Objective measurements completed on examination: See above findings.      [place] Adult PT Treatment/Exercise - 04/10/18 0930      Prosthetics   Prosthetic Care Comments   initiate wear 2 hrs 2x/day sitting only, recommend donne after toileting, no transfers with prosthesis at this time    Education Provided  Skin check;Residual limb care;Prosthetic cleaning;Proper Donning;Correct ply sock adjustment;Proper Doffing;Proper wear schedule/adjustment    Person(s) Educated  Patient;Child(ren)    Education Method  Explanation;Demonstration;Tactile cues;Verbal cues    Education Method  Verbalized understanding;Returned demonstration;Tactile cues required;Verbal cues required;Needs further instruction               PT Short Term Goals - 04/10/18 2216      PT SHORT TERM GOAL #1   Title  Patient tolerates prosthesis wear >8 hours total /day without skin issues or limb pain. (All STGs Target Date: 05/10/2018)    Time  1    Period  Months    Status  New    Target Date  05/10/18      PT SHORT TERM GOAL #2   Title  Patient & daughter report proper cleaning and donning of prosthesis.     Time  1    Period  Months    Status  New    Target Date  05/10/18      PT SHORT TERM GOAL #3   Title  standing balance with hemiwalker support scanning environment looking right/left & up/down with supervision.     Time  1    Period  Months    Status  New    Target Date  05/10/18      PT SHORT TERM GOAL #4   Title  Sit to/from stand chairs with armrest to hemiwalker with patient controlling prosthesis with supervision.     Time  1    Period  Months    Status  New  Target Date  05/10/18      PT SHORT TERM GOAL #5   Title  Patient ambulates 71' with hemiwalker & prosthesis with modA.     Time  1    Period  Months     Status  New    Target Date  05/10/18        PT Long Term Goals - 04/10/18 2210      PT LONG TERM GOAL #1   Title  Patient & daughter verbalize & demonstrate proper prosthestic care to enable safe use of prosthesis (ALl LTGs Target Date: 07/06/2018)    Time  3    Period  Months    Status  New    Target Date  07/06/18      PT LONG TERM GOAL #2   Title  Patient tolerates wear of prosthesis >80% of awake hours to enable function throughout her day.     Time  3    Period  Months    Status  New    Target Date  07/06/18      PT LONG TERM GOAL #3   Title  Patient standing balance with prosthesis scanning environment looking right/left & up/down, reaching 5" anteriorly and managing clothes with intermittent UE support.     Time  3    Period  Months    Status  New    Target Date  07/06/18      PT LONG TERM GOAL #4   Title  Patient ambulates 200' with hemiwalker & prosthesis with minA from daughter.    Time  3    Period  Months    Status  New    Target Date  07/06/18      PT LONG TERM GOAL #5   Title  Patient ambulates 21' around furniture with hemiwalker & prosthesis modified independent for household mobility.     Time  3    Period  Months    Status  New    Target Date  07/06/18      Additional Long Term Goals   Additional Long Term Goals  Yes      PT LONG TERM GOAL #6   Title  Patient negotiates ramps & curbs with hemiwalker & prosthesis with minA from daughter.     Time  3    Period  Months    Status  New    Target Date  07/06/18             Plan - 04/10/18 2155    Clinical Impression Statement  This 67yo female was functioning with hemiwalker at basic community level since CVA in 2003. She has been w/c bound since left Transfemoral Amputation October 2018 which has led to deconditioning. She recieved her first prosthesis 03/09/2018 and is dependent in care/use including maximal assist to donne prosthesis. She has worn prosthesis only 6 of 32 days since  delivery. Patient has dependent standing balance with PT manually controlling / positioning prosthesis in position of stability, supervision for static balance & moderate assist scanning environment. Patient requires manual assist to control prosthetic knee, maximal assist to moderate assist for prosthetic gait in parallel bars.     History and Personal Factors relevant to plan of care:  lives with adult daughter who works outside home, left TFA, HTN, PAD, CVA 2003    Clinical Presentation  Stable    Clinical Decision Making  Low    Rehab Potential  Good    Clinical Impairments Affecting  Rehab Potential  high fall risk, prosthetic dependency,     PT Frequency  2x / week    PT Duration  12 weeks    PT Treatment/Interventions  ADLs/Self Care Home Management;DME Instruction;Gait training;Stair training;Functional mobility training;Therapeutic activities;Therapeutic exercise;Balance training;Neuromuscular re-education;Patient/family education;Prosthetic Training;Manual techniques;Passive range of motion;Vestibular    PT Next Visit Plan  review prosthetic care, establish HEP at sink, work on sit to/from stand     Consulted and Agree with Plan of Care  Patient;Family member/caregiver    Family Member Consulted  daughter, [name]       Patient will benefit from skilled therapeutic intervention in order to improve the following deficits and impairments:  Abnormal gait, Decreased activity tolerance, Decreased balance, Decreased endurance, Decreased knowledge of use of DME, Decreased mobility, Decreased range of motion, Decreased strength, Impaired flexibility, Impaired tone, Postural dysfunction, Prosthetic Dependency  Visit Diagnosis: Hemiplegia and hemiparesis following cerebral infarction affecting left non-dominant side (HCC)  Unsteadiness on feet  Muscle weakness (generalized)  Stiffness of left hip, not elsewhere classified  Other abnormalities of gait and mobility  Abnormal  posture     Problem List Patient Active Problem List   Diagnosis Date Noted  . Spastic hemiparesis of left nondominant side ([place]) 12/27/2017  . Blood culture positive for Candida species 08/31/2017  . Septic thrombophlebitis 08/31/2017  . Migraine headache without aura 08/29/2017  . Hypokalemia   . Pain and swelling of right lower leg   . Amputation of left lower extremity above knee upon examination ([place]) 07/12/2017  . Unilateral AKA, left ([place])   . Neuropathic pain   . Fungemia   . Benign essential HTN   . Slow transit constipation   . Intra-abdominal abscess ([place])   . Fungal endophthalmitis   . Pressure injury of skin 07/06/2017  . Pelvic abscess in female [facility])   . Pelvic abscess in female   . DNR (do not resuscitate) discussion   . Palliative care by specialist   . Ischemic neuropathy of left foot   . Abdominal pain   . History of CVA with residual deficit   . Tobacco abuse   . Acute blood loss anemia   . Post-operative pain   . Leukocytosis   . Peripheral artery disease ([place]) 06/27/2017  . Critical lower limb ischemia 06/27/2017  . SBO (small bowel obstruction) ([place]) 06/27/2017  . Intraabdominal fluid collection absess 06/27/2017  . Candidemia ([place]) 06/27/2017  . Stroke [facility])     [name] PT, DPT 04/10/2018, 10:21 PM  [facility] [address] [place], [place], [address] Phone: [phone]   Fax:  [phone]  Name: Anna Hayes MRN: [id number] Date of Birth: 07/15/1951

## 2018-04-10 NOTE — Telephone Encounter (Signed)
done

## 2018-04-10 NOTE — Telephone Encounter (Signed)
Dr. Naaman Plummer Ms. Anna Hayes received her prosthesis. Can you please place a PT evaluation order in Epic? Thank you [name], PT, DPT

## 2018-04-16 ENCOUNTER — Ambulatory Visit: Payer: Medicare Other | Admitting: Physical Therapy

## 2018-04-17 DIAGNOSIS — H40051 Ocular hypertension, right eye: Secondary | ICD-10-CM | POA: Diagnosis not present

## 2018-04-17 DIAGNOSIS — H25041 Posterior subcapsular polar age-related cataract, right eye: Secondary | ICD-10-CM | POA: Diagnosis not present

## 2018-04-25 ENCOUNTER — Ambulatory Visit: Payer: Medicare Other | Admitting: Physical Therapy

## 2018-04-27 DIAGNOSIS — S78112A Complete traumatic amputation at level between left hip and knee, initial encounter: Secondary | ICD-10-CM | POA: Diagnosis not present

## 2018-04-30 ENCOUNTER — Encounter: Payer: Medicare Other | Admitting: Physical Therapy

## 2018-05-02 DIAGNOSIS — I10 Essential (primary) hypertension: Secondary | ICD-10-CM | POA: Diagnosis not present

## 2018-05-02 DIAGNOSIS — I69354 Hemiplegia and hemiparesis following cerebral infarction affecting left non-dominant side: Secondary | ICD-10-CM | POA: Diagnosis not present

## 2018-05-02 DIAGNOSIS — G43009 Migraine without aura, not intractable, without status migrainosus: Secondary | ICD-10-CM | POA: Diagnosis not present

## 2018-05-02 DIAGNOSIS — E890 Postprocedural hypothyroidism: Secondary | ICD-10-CM | POA: Diagnosis not present

## 2018-05-02 DIAGNOSIS — E782 Mixed hyperlipidemia: Secondary | ICD-10-CM | POA: Diagnosis not present

## 2018-05-02 DIAGNOSIS — K219 Gastro-esophageal reflux disease without esophagitis: Secondary | ICD-10-CM | POA: Diagnosis not present

## 2018-05-04 ENCOUNTER — Encounter: Payer: Medicare Other | Admitting: Physical Therapy

## 2018-05-09 ENCOUNTER — Encounter: Payer: Medicare Other | Admitting: Physical Therapy

## 2018-05-14 ENCOUNTER — Encounter: Payer: Self-pay | Admitting: Physical Therapy

## 2018-05-14 ENCOUNTER — Ambulatory Visit: Payer: Medicare Other | Attending: Family Medicine | Admitting: Physical Therapy

## 2018-05-14 DIAGNOSIS — I69354 Hemiplegia and hemiparesis following cerebral infarction affecting left non-dominant side: Secondary | ICD-10-CM | POA: Diagnosis not present

## 2018-05-14 DIAGNOSIS — M6281 Muscle weakness (generalized): Secondary | ICD-10-CM | POA: Diagnosis not present

## 2018-05-14 DIAGNOSIS — R293 Abnormal posture: Secondary | ICD-10-CM | POA: Diagnosis not present

## 2018-05-14 DIAGNOSIS — R2681 Unsteadiness on feet: Secondary | ICD-10-CM | POA: Diagnosis not present

## 2018-05-14 DIAGNOSIS — R2689 Other abnormalities of gait and mobility: Secondary | ICD-10-CM | POA: Diagnosis not present

## 2018-05-15 NOTE — Therapy (Signed)
[facility] [address] [place], [place], [address] Phone: [phone]   Fax:  [phone]  Physical Therapy Treatment  Patient Details  Name: Anna Hayes MRN: [id number] Date of Birth: 10-23-1950 Referring Provider: Alger Simons, MD   Encounter Date: 05/14/2018  PT End of Session - 05/14/18 1800    Visit Number  2    Number of Visits  25    Date for PT Re-Evaluation  07/09/18    Authorization Type  [employer] Medicare    PT Start Time  1410    PT Stop Time  1440    PT Time Calculation (min)  30 min    Equipment Utilized During Treatment  Gait belt    Activity Tolerance  Patient tolerated treatment well    Behavior During Therapy  [facility] for tasks assessed/performed       Past Medical History:  Diagnosis Date  . Frequent headaches   . Hypertension   . Muscle pain   . Palpitations   . Septic thrombophlebitis 08/31/2017  . Sinus problem   . Stroke [facility])     Past Surgical History:  Procedure Laterality Date  . AMPUTATION Left 07/05/2017   Procedure: LEFT ABOVE KNEE AMPUTATION;  Surgeon: Conrad Bruning, MD;  Location: [place];  Service: Vascular;  Laterality: Left;  . EMBOLECTOMY Bilateral 06/27/2017   Procedure: BILATERAL FEMORAL POPLITEAL EMBOLECTOMY;  Surgeon: Serafina Mitchell, MD;  [facility];  Service: Vascular;  Laterality: Bilateral;  . FASCIOTOMY Left 06/27/2017   Procedure: FULL COMPARTMENT LEFT LOWER LEG FASCIOTOMY;  Surgeon: Serafina Mitchell, MD;  Location: [place];  Service: Vascular;  Laterality: Left;  . PATCH ANGIOPLASTY Left 06/27/2017   Procedure: PATCH ANGIOPLASTY Left Posterior Tibial Artery;  Surgeon: Serafina Mitchell, MD;  Location: [place];  Service: Vascular;  Laterality: Left;  . 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 07/11/2017   Procedure: REPAIR OF COMPLEX TRACTION RETINAL DETACHMENT WITH ENDO LASER AND ANTIFUNGAL INJECTION;  Surgeon: Jalene Mullet, MD;  Location: [place];   Service: Ophthalmology;  Laterality: Right;  . TEE WITHOUT CARDIOVERSION N/A 07/11/2017   Procedure: TRANSESOPHAGEAL ECHOCARDIOGRAM (TEE);  Surgeon: Acie Fredrickson Wonda Cheng, MD;  Location: [facility] ENDOSCOPY;  Service: Cardiovascular;  Laterality: N/A;    There were no vitals filed for this visit.  Subjective Assessment - 05/14/18 1400    Subjective  She moved from apt to single level house. Landlord put up ramp for entrance. She has not been back to PT since eval one month ago or worn prosthesis due to moving.     Patient is accompained by:  Family member   daughter, [name]   Pertinent History  left TFA, HTN, CVA (2003)    Limitations  Lifting;Standing;Walking;House hold activities    Patient Stated Goals  to use prosthesis to return to community & household mobility    Currently in Pain?  No/denies        Prosthetic Training with Transfemoral Prosthesis: Pt arrived with prosthesis donned incorrectly. PT instructed with demo in proper alignment of liner with donning, alignment of strap and donning prosthesis tightening strap completely in standing. Pt & dtr verbalized better understanding. PT demo & instructed in sit to/from stand pushing with RUE on armrest and prosthetic control. Pt performed with minA.  Pt attempted to perform lateral side step for stand pivot transfer but unable to extend prosthetic knee or control it. Pt attempted to step anteriorly but unable to extend prosthetic knee. PT spoke  with prosthetist to put manual lock on prosthesis and PT explained to pt / dtr benefits / differences in current PT also instructed in technique for sit to/from stand with manual lock knee. Both verbalized understanding.                          PT Short Term Goals - 04/10/18 2216      PT SHORT TERM GOAL #1   Title  Patient tolerates prosthesis wear >8 hours total /day without skin issues or limb pain. (All STGs Target Date: 05/10/2018)    Time  1    Period  Months    Status  New     Target Date  05/10/18      PT SHORT TERM GOAL #2   Title  Patient & daughter report proper cleaning and donning of prosthesis.     Time  1    Period  Months    Status  New    Target Date  05/10/18      PT SHORT TERM GOAL #3   Title  standing balance with hemiwalker support scanning environment looking right/left & up/down with supervision.     Time  1    Period  Months    Status  New    Target Date  05/10/18      PT SHORT TERM GOAL #4   Title  Sit to/from stand chairs with armrest to hemiwalker with patient controlling prosthesis with supervision.     Time  1    Period  Months    Status  New    Target Date  05/10/18      PT SHORT TERM GOAL #5   Title  Patient ambulates 57' with hemiwalker & prosthesis with modA.     Time  1    Period  Months    Status  New    Target Date  05/10/18        PT Long Term Goals - 04/10/18 2210      PT LONG TERM GOAL #1   Title  Patient & daughter verbalize & demonstrate proper prosthestic care to enable safe use of prosthesis (ALl LTGs Target Date: 07/06/2018)    Time  3    Period  Months    Status  New    Target Date  07/06/18      PT LONG TERM GOAL #2   Title  Patient tolerates wear of prosthesis >80% of awake hours to enable function throughout her day.     Time  3    Period  Months    Status  New    Target Date  07/06/18      PT LONG TERM GOAL #3   Title  Patient standing balance with prosthesis scanning environment looking right/left & up/down, reaching 5" anteriorly and managing clothes with intermittent UE support.     Time  3    Period  Months    Status  New    Target Date  07/06/18      PT LONG TERM GOAL #4   Title  Patient ambulates 200' with hemiwalker & prosthesis with minA from daughter.    Time  3    Period  Months    Status  New    Target Date  07/06/18      PT LONG TERM GOAL #5   Title  Patient ambulates 33' around furniture with hemiwalker & prosthesis modified independent for household mobility.  Time  3    Period  Months    Status  New    Target Date  07/06/18      Additional Long Term Goals   Additional Long Term Goals  Yes      PT LONG TERM GOAL #6   Title  Patient negotiates ramps & curbs with hemiwalker & prosthesis with minA from daughter.     Time  3    Period  Months    Status  New    Target Date  07/06/18            Plan - 05/14/18 1800    Clinical Impression Statement  Patient & daughter improved understanding of proper donning including liner orientation. She was able to balance once PT positioned prosthesis in position to facilitate prosthetic knee stability/ extension. Pt unable to lock or control prosthesis with movements in any direction. Pt would benefit from manual lock added to prosthesis which prosthetist is going to do prior to next PT appt.     Rehab Potential  Good    Clinical Impairments Affecting Rehab Potential  high fall risk, prosthetic dependency,     PT Frequency  2x / week    PT Duration  12 weeks    PT Treatment/Interventions  ADLs/Self Care Home Management;DME Instruction;Gait training;Stair training;Functional mobility training;Therapeutic activities;Therapeutic exercise;Balance training;Neuromuscular re-education;Patient/family education;Prosthetic Training;Manual techniques;Passive range of motion;Vestibular    PT Next Visit Plan  teach sit to stand with manual lock prosthesis with locking upon arising & stand to sit with knee locked /extended; review donning, HEP standing balance at sink.     Consulted and Agree with Plan of Care  Patient;Family member/caregiver    Family Member Consulted  daughter, [name]       Patient will benefit from skilled therapeutic intervention in order to improve the following deficits and impairments:  Abnormal gait, Decreased activity tolerance, Decreased balance, Decreased endurance, Decreased knowledge of use of DME, Decreased mobility, Decreased range of motion, Decreased strength, Impaired flexibility,  Impaired tone, Postural dysfunction, Prosthetic Dependency  Visit Diagnosis: Hemiplegia and hemiparesis following cerebral infarction affecting left non-dominant side (HCC)  Unsteadiness on feet  Muscle weakness (generalized)  Other abnormalities of gait and mobility  Abnormal posture     Problem List Patient Active Problem List   Diagnosis Date Noted  . Spastic hemiparesis of left nondominant side ([place]) 12/27/2017  . Blood culture positive for Candida species 08/31/2017  . Septic thrombophlebitis 08/31/2017  . Migraine headache without aura 08/29/2017  . Hypokalemia   . Pain and swelling of right lower leg   . Amputation of left lower extremity above knee upon examination ([place]) 07/12/2017  . Unilateral AKA, left ([place])   . Neuropathic pain   . Fungemia   . Benign essential HTN   . Slow transit constipation   . Intra-abdominal abscess ([place])   . Fungal endophthalmitis   . Pressure injury of skin 07/06/2017  . Pelvic abscess in female [facility])   . Pelvic abscess in female   . DNR (do not resuscitate) discussion   . Palliative care by specialist   . Ischemic neuropathy of left foot   . Abdominal pain   . History of CVA with residual deficit   . Tobacco abuse   . Acute blood loss anemia   . Post-operative pain   . Leukocytosis   . Peripheral artery disease ([place]) 06/27/2017  . Critical lower limb ischemia 06/27/2017  . SBO (small bowel obstruction) ([place]) 06/27/2017  .  Intraabdominal fluid collection absess 06/27/2017  . Candidemia ([place]) 06/27/2017  . Stroke [facility])     [name] PT, DPT 05/15/2018, 5:13 PM  [facility] [address] [place], [place], [address] Phone: [phone]   Fax:  [phone]  Name: Winona Sison MRN: [id number] Date of Birth: [birth year]

## 2018-05-18 ENCOUNTER — Encounter: Payer: Self-pay | Admitting: Physical Therapy

## 2018-05-18 ENCOUNTER — Ambulatory Visit: Payer: Medicare Other | Admitting: Physical Therapy

## 2018-05-18 DIAGNOSIS — R2681 Unsteadiness on feet: Secondary | ICD-10-CM | POA: Diagnosis not present

## 2018-05-18 DIAGNOSIS — I69354 Hemiplegia and hemiparesis following cerebral infarction affecting left non-dominant side: Secondary | ICD-10-CM

## 2018-05-18 DIAGNOSIS — R293 Abnormal posture: Secondary | ICD-10-CM

## 2018-05-18 DIAGNOSIS — M6281 Muscle weakness (generalized): Secondary | ICD-10-CM | POA: Diagnosis not present

## 2018-05-18 DIAGNOSIS — R2689 Other abnormalities of gait and mobility: Secondary | ICD-10-CM | POA: Diagnosis not present

## 2018-05-18 NOTE — Patient Instructions (Signed)
Wear prosthesis for 2 hours in the morning and 2 hours in the afternoon/evening.  With help: Stand at the sink with the wheelchair behind you.  - to stand: scoot forward, have right foot back under you, have heel of prosthesis on floor with toes slightly in the air. As you stand the left thigh will go down and this will lock the prosthesis. Slide the foot of the prosthesis back under neath you.  - to sit: place foot of prosthesis forward on surface of open cabinet under sink. Reach back to chair with right hand and sit down slowly. Unlock prosthesis after sitting down by pull the pin toward you.  When standing at sink with help and feet positioned hip width apart: - work on weight shifting onto the right leg and hold for 5 seconds. Come back to equal weight on both side and hold for 5 seconds. Then shift weight onto prosthesis and hold for 5 seconds. Repeat sequence for 5-10 reps as pain allows.  - with equal weight on both sides move hips toward sink so weight is on toes and hold for 5 seconds, then move hips back to middle position and hold for 5 seconds. Then move hips back so weight is on heels, hold for 5 seconds. Repeat sequence for 5-10 reps as pain allows.

## 2018-05-21 NOTE — Therapy (Signed)
[facility] [address] [place], [place], [address] Phone: [phone]   Fax:  [phone]  Physical Therapy Treatment  Patient Details  Name: Anna Hayes MRN: [id number] Date of Birth: 1950-12-15 Referring Provider: Alger Simons, MD   Encounter Date: 05/18/2018     05/18/18 1153  PT Visits / Re-Eval  Visit Number 3  Number of Visits 25  Date for PT Re-Evaluation 07/09/18  Authorization  Authorization Type [employer] Medicare  PT Time Calculation  PT Start Time 1148  PT Stop Time 1230  PT Time Calculation (min) 42 min  PT - End of Session  Equipment Utilized During Treatment Gait belt  Activity Tolerance Patient tolerated treatment well  Behavior During Therapy [facility] for tasks assessed/performed    Past Medical History:  Diagnosis Date  . Frequent headaches   . Hypertension   . Muscle pain   . Palpitations   . Septic thrombophlebitis 08/31/2017  . Sinus problem   . Stroke [facility])     Past Surgical History:  Procedure Laterality Date  . AMPUTATION Left 07/05/2017   Procedure: LEFT ABOVE KNEE AMPUTATION;  Surgeon: Conrad Troutman, MD;  Location: [place];  Service: Vascular;  Laterality: Left;  . EMBOLECTOMY Bilateral 06/27/2017   Procedure: BILATERAL FEMORAL POPLITEAL EMBOLECTOMY;  Surgeon: Serafina Mitchell, MD;  [facility];  Service: Vascular;  Laterality: Bilateral;  . FASCIOTOMY Left 06/27/2017   Procedure: FULL COMPARTMENT LEFT LOWER LEG FASCIOTOMY;  Surgeon: Serafina Mitchell, MD;  Location: [place];  Service: Vascular;  Laterality: Left;  . PATCH ANGIOPLASTY Left 06/27/2017   Procedure: PATCH ANGIOPLASTY Left Posterior Tibial Artery;  Surgeon: Serafina Mitchell, MD;  Location: [place];  Service: Vascular;  Laterality: Left;  . 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 07/11/2017   Procedure: REPAIR OF COMPLEX TRACTION RETINAL DETACHMENT WITH ENDO LASER AND ANTIFUNGAL INJECTION;  Surgeon: Jalene Mullet, MD;  Location: [place];  Service: Ophthalmology;  Laterality: Right;  . TEE WITHOUT CARDIOVERSION N/A 07/11/2017   Procedure: TRANSESOPHAGEAL ECHOCARDIOGRAM (TEE);  Surgeon: Acie Fredrickson Wonda Cheng, MD;  Location: [facility] ENDOSCOPY;  Service: Cardiovascular;  Laterality: N/A;    There were no vitals filed for this visit.      05/18/18 1152  Symptoms/Limitations  Subjective No new complaints. No falls.   Patient is accompained by: Family member (daughter [name])  Pertinent History left TFA, HTN, CVA (2003)  Limitations Lifting;Standing;Walking;House hold activities  Patient Stated Goals to use prosthesis to return to community & household mobility  Pain Assessment  Currently in Pain? No/denies     05/18/18 1154  Transfers  Transfers Sit to Stand;Stand to Sit  Sit to Stand 4: Min guard;5: Supervision;With upper extremity assist;From chair/3-in-1  Stand to Sit 4: Min guard;5: Supervision;With upper extremity assist;To chair/3-in-1  Comments multiple reps wheelchair<>sink with bottom cabinet open so to place foot of locked prosthesis in there to sit down. pt progressed from min guard assist to supervision. in standing at sink worked on lateral weight shifting and anterior/posterior weight shifting with min assist/facilitation needed. cues on posture as well with standing at sink. Pt to work on standing at sink with daughter at home. Did not issue sink HEP at this time as pt needs additional practice before sending it home.   Prosthetics  Prosthetic Care Comments  pt and daughter educated in use of lock on knee of prosthesis. pt was able to independently unlock prosthesis after sitting each time once she was shown how  to work lock.  Current prosthetic wear tolerance (days/week)  has been wearing liner only as prosthesis was here to have lock placed on it  Current prosthetic wear tolerance (#hours/day)  pt is to begin wearing prosthesis 2 hours 2x day  Residual limb condition  intact without any  issues per patient and daughter  Education Provided Residual limb care;Proper Donning;Proper Doffing;Proper wear schedule/adjustment;Proper weight-bearing schedule/adjustment  Person(s) Educated Patient;Child(ren)  Education Method Explanation;Demonstration;Verbal cues  Education Method Verbalized understanding;Returned demonstration;Verbal cues required;Needs further instruction  Donning Prosthesis 4  Doffing Prosthesis 5        PT Short Term Goals - 04/10/18 2216      PT SHORT TERM GOAL #1   Title  Patient tolerates prosthesis wear >8 hours total /day without skin issues or limb pain. (All STGs Target Date: 05/10/2018)    Time  1    Period  Months    Status  New    Target Date  05/10/18      PT SHORT TERM GOAL #2   Title  Patient & daughter report proper cleaning and donning of prosthesis.     Time  1    Period  Months    Status  New    Target Date  05/10/18      PT SHORT TERM GOAL #3   Title  standing balance with hemiwalker support scanning environment looking right/left & up/down with supervision.     Time  1    Period  Months    Status  New    Target Date  05/10/18      PT SHORT TERM GOAL #4   Title  Sit to/from stand chairs with armrest to hemiwalker with patient controlling prosthesis with supervision.     Time  1    Period  Months    Status  New    Target Date  05/10/18      PT SHORT TERM GOAL #5   Title  Patient ambulates 32' with hemiwalker & prosthesis with modA.     Time  1    Period  Months    Status  New    Target Date  05/10/18        PT Long Term Goals - 04/10/18 2210      PT LONG TERM GOAL #1   Title  Patient & daughter verbalize & demonstrate proper prosthestic care to enable safe use of prosthesis (ALl LTGs Target Date: 07/06/2018)    Time  3    Period  Months    Status  New    Target Date  07/06/18      PT LONG TERM GOAL #2   Title  Patient tolerates wear of prosthesis >80% of awake hours to enable function throughout her day.      Time  3    Period  Months    Status  New    Target Date  07/06/18      PT LONG TERM GOAL #3   Title  Patient standing balance with prosthesis scanning environment looking right/left & up/down, reaching 5" anteriorly and managing clothes with intermittent UE support.     Time  3    Period  Months    Status  New    Target Date  07/06/18      PT LONG TERM GOAL #4   Title  Patient ambulates 200' with hemiwalker & prosthesis with minA from daughter.    Time  3    Period  Months  Status  New    Target Date  07/06/18      PT LONG TERM GOAL #5   Title  Patient ambulates 40' around furniture with hemiwalker & prosthesis modified independent for household mobility.     Time  3    Period  Months    Status  New    Target Date  07/06/18      Additional Long Term Goals   Additional Long Term Goals  Yes      PT LONG TERM GOAL #6   Title  Patient negotiates ramps & curbs with hemiwalker & prosthesis with minA from daughter.     Time  3    Period  Months    Status  New    Target Date  07/06/18         05/18/18 1153  Plan  Clinical Impression Statement Today's skilled session focused on donning/doffing of prosthesis with new lock on knee and how to unlock prosthesis after sitting. Pt progressed to supervision at sink with sit to stands. Pt is progressing toward goals and should benefit from continued PT to progress toward unmet goals.   Pt will benefit from skilled therapeutic intervention in order to improve on the following deficits Abnormal gait;Decreased activity tolerance;Decreased balance;Decreased endurance;Decreased knowledge of use of DME;Decreased mobility;Decreased range of motion;Decreased strength;Impaired flexibility;Impaired tone;Postural dysfunction;Prosthetic Dependency  Rehab Potential Good  Clinical Impairments Affecting Rehab Potential high fall risk, prosthetic dependency,   PT Frequency 2x / week  PT Duration 12 weeks  PT Treatment/Interventions ADLs/Self Care Home  Management;DME Instruction;Gait training;Stair training;Functional mobility training;Therapeutic activities;Therapeutic exercise;Balance training;Neuromuscular re-education;Patient/family education;Prosthetic Training;Manual techniques;Passive range of motion;Vestibular  PT Next Visit Plan HEP standing balance at sink working on weight shifting, begin gait training with hemiwalker vs quad cane  Consulted and Agree with Plan of Care Patient;Family member/caregiver  Family Member Consulted daughter, [name]          Patient will benefit from skilled therapeutic intervention in order to improve the following deficits and impairments:  Abnormal gait, Decreased activity tolerance, Decreased balance, Decreased endurance, Decreased knowledge of use of DME, Decreased mobility, Decreased range of motion, Decreased strength, Impaired flexibility, Impaired tone, Postural dysfunction, Prosthetic Dependency  Visit Diagnosis: Hemiplegia and hemiparesis following cerebral infarction affecting left non-dominant side (HCC)  Unsteadiness on feet  Other abnormalities of gait and mobility  Muscle weakness (generalized)  Abnormal posture     Problem List Patient Active Problem List   Diagnosis Date Noted  . Spastic hemiparesis of left nondominant side ([place]) 12/27/2017  . Blood culture positive for Candida species 08/31/2017  . Septic thrombophlebitis 08/31/2017  . Migraine headache without aura 08/29/2017  . Hypokalemia   . Pain and swelling of right lower leg   . Amputation of left lower extremity above knee upon examination ([place]) 07/12/2017  . Unilateral AKA, left ([place])   . Neuropathic pain   . Fungemia   . Benign essential HTN   . Slow transit constipation   . Intra-abdominal abscess ([place])   . Fungal endophthalmitis   . Pressure injury of skin 07/06/2017  . Pelvic abscess in female [facility])   . Pelvic abscess in female   . DNR (do not resuscitate) discussion   . Palliative care by specialist    . Ischemic neuropathy of left foot   . Abdominal pain   . History of CVA with residual deficit   . Tobacco abuse   . Acute blood loss anemia   . Post-operative pain   .  Leukocytosis   . Peripheral artery disease ([place]) 06/27/2017  . Critical lower limb ischemia 06/27/2017  . SBO (small bowel obstruction) ([place]) 06/27/2017  . Intraabdominal fluid collection absess 06/27/2017  . Candidemia ([place]) 06/27/2017  . Stroke [facility])     Willow Ora, PTA, [facility] [address] [place], [place] [address] [phone] 05/21/18, 10:02 AM   Name: Anna Hayes MRN: [id number] Date of Birth: 26-Jun-1951

## 2018-05-23 ENCOUNTER — Encounter: Payer: Self-pay | Admitting: Physical Therapy

## 2018-05-23 ENCOUNTER — Ambulatory Visit: Payer: Medicare Other | Admitting: Physical Therapy

## 2018-05-23 VITALS — BP 125/57 | HR 61

## 2018-05-23 DIAGNOSIS — M6281 Muscle weakness (generalized): Secondary | ICD-10-CM | POA: Diagnosis not present

## 2018-05-23 DIAGNOSIS — R293 Abnormal posture: Secondary | ICD-10-CM

## 2018-05-23 DIAGNOSIS — R2681 Unsteadiness on feet: Secondary | ICD-10-CM

## 2018-05-23 DIAGNOSIS — I69354 Hemiplegia and hemiparesis following cerebral infarction affecting left non-dominant side: Secondary | ICD-10-CM

## 2018-05-23 DIAGNOSIS — R2689 Other abnormalities of gait and mobility: Secondary | ICD-10-CM

## 2018-05-23 NOTE — Therapy (Signed)
[facility] [address] [place], [place], [address] Phone: [phone]   Fax:  [phone]  Physical Therapy Treatment  Patient Details  Name: Anna Hayes MRN: [id number] Date of Birth: 07/05/1951 Referring Provider: Alger Simons, MD   Encounter Date: 05/23/2018  PT End of Session - 05/23/18 1320    Visit Number  4    Number of Visits  25    Date for PT Re-Evaluation  07/09/18    Authorization Type  [employer] Medicare    PT Start Time  1230    PT Stop Time  1316    PT Time Calculation (min)  46 min    Equipment Utilized During Treatment  Gait belt    Activity Tolerance  Patient tolerated treatment well    Behavior During Therapy  [facility] for tasks assessed/performed       Past Medical History:  Diagnosis Date  . Frequent headaches   . Hypertension   . Muscle pain   . Palpitations   . Septic thrombophlebitis 08/31/2017  . Sinus problem   . Stroke [facility])     Past Surgical History:  Procedure Laterality Date  . AMPUTATION Left 07/05/2017   Procedure: LEFT ABOVE KNEE AMPUTATION;  Surgeon: Conrad , MD;  Location: [place];  Service: Vascular;  Laterality: Left;  . EMBOLECTOMY Bilateral 06/27/2017   Procedure: BILATERAL FEMORAL POPLITEAL EMBOLECTOMY;  Surgeon: Serafina Mitchell, MD;  Location: [facility];  Service: Vascular;  Laterality: Bilateral;  . FASCIOTOMY Left 06/27/2017   Procedure: FULL COMPARTMENT LEFT LOWER LEG FASCIOTOMY;  Surgeon: Serafina Mitchell, MD;  Location: [place];  Service: Vascular;  Laterality: Left;  . PATCH ANGIOPLASTY Left 06/27/2017   Procedure: PATCH ANGIOPLASTY Left Posterior Tibial Artery;  Surgeon: Serafina Mitchell, MD;  Location: [place];  Service: Vascular;  Laterality: Left;  . 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 07/11/2017   Procedure: REPAIR OF COMPLEX TRACTION RETINAL DETACHMENT WITH ENDO LASER AND ANTIFUNGAL INJECTION;  Surgeon: Jalene Mullet, MD;  Location: [place];   Service: Ophthalmology;  Laterality: Right;  . TEE WITHOUT CARDIOVERSION N/A 07/11/2017   Procedure: TRANSESOPHAGEAL ECHOCARDIOGRAM (TEE);  Surgeon: Acie Fredrickson Wonda Cheng, MD;  Location: [facility] ENDOSCOPY;  Service: Cardiovascular;  Laterality: N/A;    Vitals:   05/23/18 1244  BP: (!) 125/57  Pulse: 61    Subjective Assessment - 05/23/18 1228    Subjective  She has been wearing prosthesis 2hrs some days 2x if dtr schedule permits. She feels the lock on prosthesis helps and she feels she can learn to use it now.     Patient is accompained by:  Family member   daughter [name]   Pertinent History  left TFA, HTN, CVA (2003)    Limitations  Lifting;Standing;Walking;House hold activities    Patient Stated Goals  to use prosthesis to return to community & household mobility    Currently in Pain?  No/denies      Prosthetic Training with locked Transfemoral Amputation:  Pt arrived with prosthesis properly donned.  She reports wear 2 hrs 2x/day without issues. PT verbally instructed pt & dtr to increase to 3 hrs 2x/day. Pt verbalized understanding.  Sit to stand with tactile & verbal cues on technique to lock prosthesis while arising then reach for [facility] with minA. Stand to sit keeping prosthetic knee locked until seated.  Standing balance with LBQC support & locked prosthesis scans looking right/left & up/down. PT attempted gait with [facility] but pt unable to  advance RLE due to limited weight bearing on prosthesis.  Parallel bars: Pt ambulated 10' with modA with short step length BLEs.                          PT Education - 05/23/18 1300    Education provided  Yes    Education Details  Add LBQC to HEP at sink & attempt balance with [facility] but use sink for safety, stability as needed. PT added right heel raise/knee flexion at slow controlled speed and progress to left wt shift then heel raise to simulate preparing for RLE swing/step    Person(s) Educated  Patient    Methods   Explanation;Demonstration;Tactile cues;Verbal cues    Comprehension  Verbalized understanding;Returned demonstration;Verbal cues required;Tactile cues required       PT Short Term Goals - 05/23/18 2132      PT SHORT TERM GOAL #1   Title  Patient tolerates prosthesis wear >8 hours total /day without skin issues or limb pain. (All STGs Target Date: 05/10/2018)    Baseline  NOT MET 05/23/2018 Pt wearing prosthesis 2 hrs 2x/day without issues.     Time  1    Period  Months    Status  Not Met      PT SHORT TERM GOAL #2   Title  Patient & daughter report proper cleaning and donning of prosthesis.     Baseline  MET 05/23/2018    Time  1    Period  Months    Status  Achieved      PT SHORT TERM GOAL #3   Title  standing balance with hemiwalker support scanning environment looking right/left & up/down with supervision.     Baseline  MET 05/23/2018 with [facility]    Time  1    Period  Months    Status  Achieved      PT SHORT TERM GOAL #4   Title  Sit to/from stand chairs with armrest to hemiwalker with patient controlling prosthesis with supervision.     Baseline  NOT MET 05/23/2018 sit to/from stand with minA & cues    Time  1    Period  Months    Status  Not Met      PT SHORT TERM GOAL #5   Title  Patient ambulates 57' with hemiwalker & prosthesis with modA.     Baseline  NOT MET 05/23/2018 Patient ambulates 10' in parallel bars with modA.     Time  1    Period  Months    Status  Not Met       PT Short Term Goals - 05/23/18 2145      PT SHORT TERM GOAL #1   Title  Patient tolerates prosthesis wear >8 hours total /day without skin issues or limb pain. (All updated STGs Target Date: 06/15/2018)    Baseline       Time  4    Period  Weeks    Status  On-going    Target Date  06/15/18      PT SHORT TERM GOAL #2   Title  Patient verbalizes initiating adjusting ply socks with volume changes.     Baseline        Time  4    Period  Weeks    Status  New    Target Date  06/15/18      PT  SHORT TERM GOAL #3   Title  Pt standing  balance with intermittent LBQC support able to adjust pants & shirt with supervision.     Baseline        Time  4    Period  Weeks    Status  Revised    Target Date  06/15/18      PT SHORT TERM GOAL #4   Title  Sit to/from stand chairs with armrest to [facility] with patient controlling prosthesis with supervision.     Baseline        Time  4    Period  Weeks    Status  Revised    Target Date  06/15/18      PT SHORT TERM GOAL #5   Title  Patient ambulates 35' with [facility] & prosthesis with modA.     Baseline        Time  4    Period  Weeks    Status  Revised    Target Date  06/15/18         PT Long Term Goals - 05/23/18 2135      PT LONG TERM GOAL #1   Title  Patient & daughter verbalize & demonstrate proper prosthestic care to enable safe use of prosthesis (ALl LTGs Target Date: 07/06/2018)    Time  3    Period  Months    Status  On-going    Target Date  07/06/18      PT LONG TERM GOAL #2   Title  Patient tolerates wear of prosthesis >80% of awake hours to enable function throughout her day.     Time  3    Period  Months    Status  On-going    Target Date  07/06/18      PT LONG TERM GOAL #3   Title  Patient standing balance with prosthesis scanning environment looking right/left & up/down, reaching 5" anteriorly and managing clothes with intermittent UE support.     Time  3    Period  Months    Status  On-going    Target Date  07/06/18      PT LONG TERM GOAL #4   Title  Patient ambulates 200' with hemiwalker & prosthesis with minA from daughter.    Time  3    Period  Months    Status  On-going    Target Date  07/06/18      PT LONG TERM GOAL #5   Title  Patient ambulates 59' around furniture with hemiwalker & prosthesis modified independent for household mobility.     Time  3    Period  Months    Status  On-going    Target Date  07/06/18      PT LONG TERM GOAL #6   Title  Patient negotiates ramps & curbs with hemiwalker  & prosthesis with minA from daughter.     Time  3    Period  Months    Status  On-going    Target Date  07/06/18            Plan - 05/23/18 2137    Clinical Impression Statement  Patient met or partially met Short Term Goals. Limited progress due to missing appointments with recent move. Patient improved weight shift on prosthesis & beginning to initiate RLE for swing.     Rehab Potential  Good    Clinical Impairments Affecting Rehab Potential  high fall risk, prosthetic dependency,     PT Frequency  2x / week  PT Duration  12 weeks    PT Treatment/Interventions  ADLs/Self Care Home Management;DME Instruction;Gait training;Stair training;Functional mobility training;Therapeutic activities;Therapeutic exercise;Balance training;Neuromuscular re-education;Patient/family education;Prosthetic Training;Manual techniques;Passive range of motion;Vestibular    PT Next Visit Plan  balance & gait with [facility]    Consulted and Agree with Plan of Care  Patient;Family member/caregiver    Family Member Consulted  daughter, [name]       Patient will benefit from skilled therapeutic intervention in order to improve the following deficits and impairments:  Abnormal gait, Decreased activity tolerance, Decreased balance, Decreased endurance, Decreased knowledge of use of DME, Decreased mobility, Decreased range of motion, Decreased strength, Impaired flexibility, Impaired tone, Postural dysfunction, Prosthetic Dependency  Visit Diagnosis: Hemiplegia and hemiparesis following cerebral infarction affecting left non-dominant side (HCC)  Unsteadiness on feet  Other abnormalities of gait and mobility  Muscle weakness (generalized)  Abnormal posture     Problem List Patient Active Problem List   Diagnosis Date Noted  . Spastic hemiparesis of left nondominant side ([place]) 12/27/2017  . Blood culture positive for Candida species 08/31/2017  . Septic thrombophlebitis 08/31/2017  . Migraine headache  without aura 08/29/2017  . Hypokalemia   . Pain and swelling of right lower leg   . Amputation of left lower extremity above knee upon examination ([place]) 07/12/2017  . Unilateral AKA, left ([place])   . Neuropathic pain   . Fungemia   . Benign essential HTN   . Slow transit constipation   . Intra-abdominal abscess ([place])   . Fungal endophthalmitis   . Pressure injury of skin 07/06/2017  . Pelvic abscess in female [facility])   . Pelvic abscess in female   . DNR (do not resuscitate) discussion   . Palliative care by specialist   . Ischemic neuropathy of left foot   . Abdominal pain   . History of CVA with residual deficit   . Tobacco abuse   . Acute blood loss anemia   . Post-operative pain   . Leukocytosis   . Peripheral artery disease ([place]) 06/27/2017  . Critical lower limb ischemia 06/27/2017  . SBO (small bowel obstruction) ([place]) 06/27/2017  . Intraabdominal fluid collection absess 06/27/2017  . Candidemia ([place]) 06/27/2017  . Stroke [facility])     [name] PT, DPT 05/23/2018, 9:40 PM  [facility] [address] [place], [place], [address] Phone: [phone]   Fax:  [phone]  Name: Anna Hayes MRN: [id number] Date of Birth: 24-Feb-1951

## 2018-05-24 ENCOUNTER — Encounter: Payer: Medicare Other | Admitting: Physical Therapy

## 2018-05-25 DIAGNOSIS — H40051 Ocular hypertension, right eye: Secondary | ICD-10-CM | POA: Diagnosis not present

## 2018-05-28 DIAGNOSIS — S78112A Complete traumatic amputation at level between left hip and knee, initial encounter: Secondary | ICD-10-CM | POA: Diagnosis not present

## 2018-06-11 ENCOUNTER — Other Ambulatory Visit: Payer: Self-pay

## 2018-06-11 ENCOUNTER — Encounter (HOSPITAL_COMMUNITY): Payer: Self-pay | Admitting: *Deleted

## 2018-06-11 DIAGNOSIS — H40051 Ocular hypertension, right eye: Secondary | ICD-10-CM | POA: Diagnosis not present

## 2018-06-11 NOTE — Progress Notes (Signed)
Spoke with pt for pre-op call. Pt denies cardiac history. States she has had DVT's in the past and is on Xarelto. She states she was not instructed to stop the Xarelto. Pt also has hx of a stroke in 2003 and can't use her right arm. Pt states she is not diabetic.

## 2018-06-12 ENCOUNTER — Ambulatory Visit (HOSPITAL_COMMUNITY): Payer: Medicare Other | Admitting: Anesthesiology

## 2018-06-12 ENCOUNTER — Ambulatory Visit: Payer: Self-pay | Admitting: Ophthalmology

## 2018-06-12 ENCOUNTER — Ambulatory Visit (HOSPITAL_COMMUNITY)
Admission: RE | Admit: 2018-06-12 | Discharge: 2018-06-12 | Disposition: A | Discharge status: Home / Self Care | Payer: Medicare Other | Source: Ambulatory Visit | Attending: Ophthalmology | Admitting: Ophthalmology

## 2018-06-12 ENCOUNTER — Encounter (HOSPITAL_COMMUNITY)
Admission: RE | Disposition: A | Discharge status: Home / Self Care | Payer: Self-pay | Source: Ambulatory Visit | Attending: Ophthalmology

## 2018-06-12 ENCOUNTER — Encounter (HOSPITAL_COMMUNITY): Payer: Self-pay | Admitting: Anesthesiology

## 2018-06-12 DIAGNOSIS — H269 Unspecified cataract: Secondary | ICD-10-CM | POA: Diagnosis not present

## 2018-06-12 DIAGNOSIS — Z86718 Personal history of other venous thrombosis and embolism: Secondary | ICD-10-CM | POA: Diagnosis not present

## 2018-06-12 DIAGNOSIS — I739 Peripheral vascular disease, unspecified: Secondary | ICD-10-CM | POA: Diagnosis not present

## 2018-06-12 DIAGNOSIS — Z8673 Personal history of transient ischemic attack (TIA), and cerebral infarction without residual deficits: Secondary | ICD-10-CM | POA: Diagnosis not present

## 2018-06-12 DIAGNOSIS — Z79899 Other long term (current) drug therapy: Secondary | ICD-10-CM | POA: Insufficient documentation

## 2018-06-12 DIAGNOSIS — Z7902 Long term (current) use of antithrombotics/antiplatelets: Secondary | ICD-10-CM | POA: Insufficient documentation

## 2018-06-12 DIAGNOSIS — I10 Essential (primary) hypertension: Secondary | ICD-10-CM | POA: Diagnosis not present

## 2018-06-12 DIAGNOSIS — H25041 Posterior subcapsular polar age-related cataract, right eye: Secondary | ICD-10-CM | POA: Diagnosis not present

## 2018-06-12 DIAGNOSIS — J449 Chronic obstructive pulmonary disease, unspecified: Secondary | ICD-10-CM | POA: Diagnosis not present

## 2018-06-12 DIAGNOSIS — M791 Myalgia, unspecified site: Secondary | ICD-10-CM | POA: Insufficient documentation

## 2018-06-12 HISTORY — DX: Constipation, unspecified: K59.00

## 2018-06-12 HISTORY — DX: Acute embolism and thrombosis of unspecified deep veins of unspecified lower extremity: I82.409

## 2018-06-12 HISTORY — DX: Anemia, unspecified: D64.9

## 2018-06-12 HISTORY — DX: Cardiac murmur, unspecified: R01.1

## 2018-06-12 HISTORY — PX: CATARACT EXTRACTION W/PHACO: SHX586

## 2018-06-12 LAB — BASIC METABOLIC PANEL
ANION GAP: 10 (ref 5–15)
BUN: 21 mg/dL (ref 8–23)
CALCIUM: 9.3 mg/dL (ref 8.9–10.3)
CO2: 25 mmol/L (ref 22–32)
CREATININE: 0.88 mg/dL (ref 0.44–1.00)
Chloride: 103 mmol/L (ref 98–111)
GFR calc Af Amer: >60 mL/min (ref >60)
Glucose, Bld: 90 mg/dL (ref 70–99)
Potassium: 4 mmol/L (ref 3.5–5.1)
Sodium: 138 mmol/L (ref 135–145)

## 2018-06-12 LAB — CBC
HCT: 34.6 % — ABNORMAL LOW (ref 36.0–46.0)
Hemoglobin: 10.8 g/dL — ABNORMAL LOW (ref 12.0–15.0)
MCH: 25.2 pg — ABNORMAL LOW (ref 26.0–34.0)
MCHC: 31.2 g/dL (ref 30.0–36.0)
MCV: 80.7 fL (ref 78.0–100.0)
PLATELETS: 425 10*3/uL — AB (ref 150–400)
RBC: 4.29 MIL/uL (ref 3.87–5.11)
RDW: 16.4 % — AB (ref 11.5–15.5)
WBC: 9.5 10*3/uL (ref 4.0–10.5)

## 2018-06-12 LAB — PROTIME-INR
INR: 1.21
PROTHROMBIN TIME: 15.2 s (ref 11.4–15.2)

## 2018-06-12 SURGERY — PHACOEMULSIFICATION, CATARACT, WITH IOL INSERTION
Anesthesia: Monitor Anesthesia Care | Site: Eye | Laterality: Right

## 2018-06-12 MED ORDER — GENTAMICIN SULFATE 40 MG/ML IJ SOLN
INTRAMUSCULAR | Status: AC
  Filled 2018-06-12: qty 2

## 2018-06-12 MED ORDER — ACETYLCHOLINE CHLORIDE 20 MG IO SOLR
INTRAOCULAR | Status: AC
  Filled 2018-06-12: qty 1

## 2018-06-12 MED ORDER — DEXAMETHASONE SODIUM PHOSPHATE 10 MG/ML IJ SOLN
INTRAMUSCULAR | Status: AC
  Filled 2018-06-12: qty 1

## 2018-06-12 MED ORDER — ONDANSETRON HCL 4 MG/2ML IJ SOLN
INTRAMUSCULAR | Status: AC
  Filled 2018-06-12: qty 2

## 2018-06-12 MED ORDER — TOBRAMYCIN 0.3 % OP OINT
TOPICAL_OINTMENT | OPHTHALMIC | Status: DC | PRN
  Administered 2018-06-12: 1 via OPHTHALMIC

## 2018-06-12 MED ORDER — BUPIVACAINE HCL (PF) 0.5 % IJ SOLN
INTRAMUSCULAR | Status: AC
  Filled 2018-06-12: qty 10

## 2018-06-12 MED ORDER — PROMETHAZINE HCL 25 MG/ML IJ SOLN: 6.2500 mg | INTRAMUSCULAR | Status: DC | PRN

## 2018-06-12 MED ORDER — BUPIVACAINE HCL (PF) 0.75 % IJ SOLN
INTRAMUSCULAR | Status: AC
  Filled 2018-06-12: qty 10

## 2018-06-12 MED ORDER — MIDAZOLAM HCL 5 MG/5ML IJ SOLN
INTRAMUSCULAR | Status: DC | PRN
  Administered 2018-06-12: 0.5 mg via INTRAVENOUS
  Administered 2018-06-12: 1 mg via INTRAVENOUS
  Administered 2018-06-12: 0.5 mg via INTRAVENOUS

## 2018-06-12 MED ORDER — BSS IO SOLN
INTRAOCULAR | Status: AC
  Filled 2018-06-12: qty 500

## 2018-06-12 MED ORDER — BUPIVACAINE HCL (PF) 0.75 % IJ SOLN
INTRAMUSCULAR | Status: DC | PRN
  Administered 2018-06-12: 4 mL

## 2018-06-12 MED ORDER — NA CHONDROIT SULF-NA HYALURON 40-30 MG/ML IO SOLN
INTRAOCULAR | Status: DC | PRN
  Administered 2018-06-12: 0.75 mL via INTRAOCULAR

## 2018-06-12 MED ORDER — ACETYLCHOLINE CHLORIDE 20 MG IO SOLR
INTRAOCULAR | Status: DC | PRN
  Administered 2018-06-12: 10 mg via INTRAOCULAR

## 2018-06-12 MED ORDER — FENTANYL CITRATE (PF) 100 MCG/2ML IJ SOLN: 25.0000 ug | INTRAMUSCULAR | Status: DC | PRN

## 2018-06-12 MED ORDER — NEOMYCIN-POLYMYXIN-DEXAMETH 3.5-10000-0.1 OP OINT
TOPICAL_OINTMENT | OPHTHALMIC | Status: AC
  Filled 2018-06-12: qty 3.5

## 2018-06-12 MED ORDER — DEXAMETHASONE SODIUM PHOSPHATE 10 MG/ML IJ SOLN
INTRAMUSCULAR | Status: DC | PRN
  Administered 2018-06-12: 10 mg via INTRA_ARTICULAR

## 2018-06-12 MED ORDER — PROPOFOL 10 MG/ML IV BOLUS
INTRAVENOUS | Status: AC
  Filled 2018-06-12: qty 20

## 2018-06-12 MED ORDER — OFLOXACIN 0.3 % OP SOLN
1.0000 [drp] | OPHTHALMIC | Status: AC | PRN
  Administered 2018-06-12 (×3): 1 [drp] via OPHTHALMIC
  Filled 2018-06-12: qty 5

## 2018-06-12 MED ORDER — BSS PLUS IO SOLN
INTRAOCULAR | Status: DC | PRN
  Administered 2018-06-12: 1 via INTRAOCULAR

## 2018-06-12 MED ORDER — SODIUM HYALURONATE 10 MG/ML IO SOLN
INTRAOCULAR | Status: DC | PRN
  Administered 2018-06-12: 0.85 mL via INTRAOCULAR

## 2018-06-12 MED ORDER — CYCLOPENTOLATE HCL 1 % OP SOLN
1.0000 [drp] | OPHTHALMIC | Status: AC | PRN
  Administered 2018-06-12 (×3): 1 [drp] via OPHTHALMIC
  Filled 2018-06-12: qty 2

## 2018-06-12 MED ORDER — LIDOCAINE HCL 2 % IJ SOLN
INTRAMUSCULAR | Status: DC | PRN
  Administered 2018-06-12: 4 mL

## 2018-06-12 MED ORDER — FENTANYL CITRATE (PF) 250 MCG/5ML IJ SOLN
INTRAMUSCULAR | Status: DC | PRN
  Administered 2018-06-12 (×2): 50 ug via INTRAVENOUS

## 2018-06-12 MED ORDER — BSS IO SOLN
INTRAOCULAR | Status: DC | PRN
  Administered 2018-06-12: 500 mL via INTRAOCULAR

## 2018-06-12 MED ORDER — PHENYLEPHRINE HCL 2.5 % OP SOLN
1.0000 [drp] | OPHTHALMIC | Status: AC | PRN
  Administered 2018-06-12 (×3): 1 [drp] via OPHTHALMIC
  Filled 2018-06-12: qty 2

## 2018-06-12 MED ORDER — FENTANYL CITRATE (PF) 250 MCG/5ML IJ SOLN
INTRAMUSCULAR | Status: AC
  Filled 2018-06-12: qty 5

## 2018-06-12 MED ORDER — ONDANSETRON HCL 4 MG/2ML IJ SOLN
INTRAMUSCULAR | Status: DC | PRN
  Administered 2018-06-12: 4 mg via INTRAVENOUS

## 2018-06-12 MED ORDER — EPINEPHRINE PF 1 MG/ML IJ SOLN
INTRAMUSCULAR | Status: DC | PRN
  Administered 2018-06-12: 1 mg

## 2018-06-12 MED ORDER — TETRACAINE HCL 0.5 % OP SOLN
OPHTHALMIC | Status: DC | PRN
  Administered 2018-06-12: 1 [drp] via OPHTHALMIC

## 2018-06-12 MED ORDER — BSS IO SOLN
INTRAOCULAR | Status: AC
  Filled 2018-06-12: qty 15

## 2018-06-12 MED ORDER — PREDNISOLONE ACETATE 1 % OP SUSP
OPHTHALMIC | Status: AC
  Filled 2018-06-12: qty 5

## 2018-06-12 MED ORDER — MEPERIDINE HCL 50 MG/ML IJ SOLN: 6.2500 mg | INTRAMUSCULAR | Status: DC | PRN

## 2018-06-12 MED ORDER — SODIUM CHLORIDE 0.9 % IV SOLN
INTRAVENOUS | Status: DC
  Administered 2018-06-12: 15:00:00 via INTRAVENOUS

## 2018-06-12 MED ORDER — MIDAZOLAM HCL 2 MG/2ML IJ SOLN
INTRAMUSCULAR | Status: AC
  Filled 2018-06-12: qty 2

## 2018-06-12 MED ORDER — PHENYLEPHRINE HCL 10 % OP SOLN
1.0000 [drp] | OPHTHALMIC | Status: AC | PRN
  Administered 2018-06-12 (×3): 1 [drp] via OPHTHALMIC
  Filled 2018-06-12: qty 5

## 2018-06-12 MED ORDER — GATIFLOXACIN 0.5 % OP SOLN
OPHTHALMIC | Status: AC
  Filled 2018-06-12: qty 2.5

## 2018-06-12 MED ORDER — PROPOFOL 10 MG/ML IV BOLUS
INTRAVENOUS | Status: DC | PRN
  Administered 2018-06-12 (×2): 30 mg via INTRAVENOUS

## 2018-06-12 SURGICAL SUPPLY — 38 items
APL SRG 3 HI ABS STRL LF PLS (MISCELLANEOUS) ×1
APPLICATOR COTTON TIP 6IN STRL (MISCELLANEOUS) ×3 IMPLANT
APPLICATOR DR MATTHEWS STRL (MISCELLANEOUS) ×3 IMPLANT
BLADE 10 SAFETY STRL DISP (BLADE) ×3 IMPLANT
BLADE KERATOME 2.75 (BLADE) ×1 IMPLANT
BLADE KERATOME 2.75MM (BLADE) ×1
CANN HYDRODISSECT NUC 25X7/8 (MISCELLANEOUS) ×3
CANNULA ANTERIOR CHAMBER 27GA (MISCELLANEOUS) IMPLANT
CANNULA HYDRODISSEC NUC 25X7/8 (MISCELLANEOUS) ×1 IMPLANT
DRAPE OPHTHALMIC 77X100 STRL (CUSTOM PROCEDURE TRAY) ×3 IMPLANT
GLOVE SS BIOGEL STRL SZ 8.5 (GLOVE) ×1 IMPLANT
GLOVE SUPERSENSE BIOGEL SZ 8.5 (GLOVE) ×2
GOWN STRL REUS W/ TWL LRG LVL3 (GOWN DISPOSABLE) ×1 IMPLANT
GOWN STRL REUS W/ TWL XL LVL3 (GOWN DISPOSABLE) ×1 IMPLANT
GOWN STRL REUS W/TWL LRG LVL3 (GOWN DISPOSABLE) ×3
GOWN STRL REUS W/TWL XL LVL3 (GOWN DISPOSABLE) ×3
KIT TURNOVER KIT B (KITS) ×3 IMPLANT
KNIFE CRESCENT 2.5 55 ANG (BLADE) IMPLANT
LENS IOL ACRSF IQ PC 22.5 (Intraocular Lens) IMPLANT
LENS IOL ACRYSOF IQ POST 22.5 (Intraocular Lens) ×3 IMPLANT
MARKER SKIN DUAL TIP RULER LAB (MISCELLANEOUS) ×3 IMPLANT
NDL FILTER BLUNT 18X1 1/2 (NEEDLE) IMPLANT
NDL HYPO 25GX1X1/2 BEV (NEEDLE) ×1 IMPLANT
NEEDLE FILTER BLUNT 18X 1/2SAF (NEEDLE)
NEEDLE FILTER BLUNT 18X1 1/2 (NEEDLE) IMPLANT
NEEDLE HYPO 25GX1X1/2 BEV (NEEDLE) ×3 IMPLANT
NS IRRIG 1000ML POUR BTL (IV SOLUTION) ×3 IMPLANT
PACK CATARACT CUSTOM (CUSTOM PROCEDURE TRAY) ×3 IMPLANT
PACK CATARACT MCHSCP (PACKS) ×3 IMPLANT
PAD ARMBOARD 7.5X6 YLW CONV (MISCELLANEOUS) ×6 IMPLANT
PROBE ANTERIOR 20G W/INFUS NDL (MISCELLANEOUS) IMPLANT
STOCKINETTE IMPERVIOUS 9X36 MD (GAUZE/BANDAGES/DRESSINGS) ×6 IMPLANT
SUT ETHILON 10 0 CS140 6 (SUTURE) ×3 IMPLANT
SUT VICRYL  9 0 (SUTURE) ×2
SUT VICRYL 9 0 (SUTURE) ×1 IMPLANT
TIP ABS 45DEG FLARED 0.9MM (TIP) ×2 IMPLANT
TOWEL OR 17X24 6PK STRL BLUE (TOWEL DISPOSABLE) ×6 IMPLANT
WATER STERILE IRR 1000ML POUR (IV SOLUTION) ×3 IMPLANT

## 2018-06-12 NOTE — Op Note (Signed)
Diagnosis: Cataract right eye  Procedure: Cataract extraction with intraocular lens implant right eye  Surgeon: Royston Cowper Anesthesia: Local   Estimated Blood Loss: minimal Specimens for Pathology: N/A Complications: None  The patient was prepped and draped in the usual fashion for ocular surgery on the right eye. A lid speculum was placed. The pupil was mid-dilated.    A side port incision was made.  Viscoelastic was used to fill the anterior chamber.  A superior clear corneal wound was made in a beveled fashion. The cystotome was used to perform a continous curvilinear capsulotomy. The phaco probe was inserted and the lens emulsified. All cortex and lens was removed with the IA tip.  The capsular bag was inflated with cohesive viscoelastic.  A 22.5 diopter [id number] IOL was placed in the capsular bag. The superior wound was closed with 10-0 nylon.  Miochol was placed through the sideport incision. The wounds were noted to be water tight. The IOL was well centered.  Decadron was placed in the subconjunctival space.  The speculum and drapes were removed and the eye was patched with Polymixin/Bacitracin ophthalmic ointment. An eye shield was placed and the patient was transferred alert and conversant with stable vital signs to the post operative recovery area. The patient tolerated the procedure well and no complications were noted.

## 2018-06-12 NOTE — Brief Op Note (Signed)
06/12/2018  6:24 PM  PATIENT:  Anna Hayes  67 y.o. female  PRE-OPERATIVE DIAGNOSIS:  cataract, right eye  POST-OPERATIVE DIAGNOSIS:  cataract, right eye  PROCEDURE:  Procedure(s): CATARACT EXTRACTION WITH INTRAOCULAR LENS IMPLANT (Right)  SURGEON:  Surgeon(s) and Role:    * Jalene Mullet, MD - Primary  PHYSICIAN ASSISTANT:   ASSISTANTS: none   ANESTHESIA:   local and MAC  EBL:  minimal   BLOOD ADMINISTERED:none  DRAINS: none   LOCAL MEDICATIONS USED:  MARCAINE    and LIDOCAINE   SPECIMEN:  No Specimen  DISPOSITION OF SPECIMEN:  N/A  COUNTS:  YES  TOURNIQUET: none DICTATION: .Note written in EPIC  PLAN OF CARE: Discharge to home after PACU  PATIENT DISPOSITION:  PACU - hemodynamically stable.   Delay start of Pharmacological VTE agent (>24hrs) due to surgical blood loss or risk of bleeding: not applicable

## 2018-06-12 NOTE — H&P (Deleted)
  The note originally documented on this encounter has been moved the the encounter in which it belongs.  

## 2018-06-12 NOTE — Anesthesia Preprocedure Evaluation (Signed)
Anesthesia Evaluation  Patient identified by MRN, date of birth, ID band Patient awake    Reviewed: Allergy & Precautions, NPO status , Patient's Chart, lab work & pertinent test results, reviewed documented beta blocker date and time   Airway Mallampati: II  TM Distance: >3 FB Neck ROM: Full    Dental  (+) Upper Dentures, Edentulous Upper, Edentulous Lower   Pulmonary COPD, Current Smoker,    Pulmonary exam normal breath sounds clear to auscultation       Cardiovascular hypertension, Pt. on medications and Pt. on home beta blockers + Peripheral Vascular Disease  Normal cardiovascular exam+ Valvular Problems/Murmurs  Rhythm:Regular Rate:Normal  Hx/o ischemic left lower ext.   Neuro/Psych  Headaches, Hx/o Fungal endophthalmitis Retinal detachment OD Weakness left side  Neuromuscular disease CVA, Residual Symptoms negative psych ROS   GI/Hepatic Neg liver ROS, GERD  Controlled and Medicated,  Endo/Other  Hyperlipidemia  Renal/GU negative Renal ROS     Musculoskeletal S/P Left AKA   Abdominal   Peds  Hematology  (+) anemia , Hx/o candidemia   Anesthesia Other Findings   Reproductive/Obstetrics Pelvic abscess                             Anesthesia Physical  Anesthesia Plan  ASA: III  Anesthesia Plan: MAC   Post-op Pain Management:    Induction: Intravenous  PONV Risk Score and Plan: 4 or greater and Ondansetron, Dexamethasone, Propofol infusion and Treatment may vary due to age or medical condition  Airway Management Planned: Simple Face Mask and Nasal Cannula  Additional Equipment: None  Intra-op Plan:   Post-operative Plan:   Informed Consent: I have reviewed the patients History and Physical, chart, labs and discussed the procedure including the risks, benefits and alternatives for the proposed anesthesia with the patient or authorized representative who has indicated his/her  understanding and acceptance.   Dental advisory given  Plan Discussed with: CRNA  Anesthesia Plan Comments:         Anesthesia Quick Evaluation

## 2018-06-12 NOTE — Anesthesia Procedure Notes (Signed)
Procedure Name: MAC Date/Time: 06/12/2018 5:34 PM Performed by: Teressa Lower., CRNA Pre-anesthesia Checklist: Patient identified, Emergency Drugs available, Suction available, Patient being monitored and Timeout performed Patient Re-evaluated:Patient Re-evaluated prior to induction Oxygen Delivery Method: Nasal cannula

## 2018-06-12 NOTE — Discharge Instructions (Signed)
Keep lens patch in place until follow-up appointment.

## 2018-06-12 NOTE — H&P (Signed)
Date of examination:  06/11/18  Indication for surgery: Cataract right eye with blurry vision  Pertinent past medical history:  Past Medical History:  Diagnosis Date  . Anemia   . Constipation   . DVT of lower extremity (deep venous thrombosis) ([place])   . Frequent headaches   . Heart murmur    found during pregnancy -  no problems  . Hypertension   . Muscle pain   . Palpitations   . Septic thrombophlebitis 08/31/2017  . Sinus problem   . Stroke [facility])     Pertinent ocular history:  History of proliferative diabetic retinopathy and cataracts OU  Pertinent family history: No family history on file.  General:  Healthy appearing patient in no distress.    Eyes:    Acuity OD 20/400    External: Within normal limits     Anterior segment: Cataract OD>>OS   Fundus: No view OD, PDR with attenuated vessels and laser scars OS      Impression:  Cataract right eye  Plan: Cataract extraction with intraocular lens implant right eye  Royston Cowper

## 2018-06-12 NOTE — Transfer of Care (Signed)
Immediate Anesthesia Transfer of Care Note  Patient: Anna Hayes  Procedure(s) Performed: CATARACT EXTRACTION (Right Eye)  Patient Location: PACU  Anesthesia Type:MAC  Level of Consciousness: awake, alert  and oriented  Airway & Oxygen Therapy: Patient Spontanous Breathing  Post-op Assessment: Report given to RN and Post -op Vital signs reviewed and stable  Post vital signs: Reviewed and stable  Last Vitals:  Vitals Value Taken Time  BP    Temp    Pulse 73 06/12/2018  6:25 PM  Resp 10 06/12/2018  6:25 PM  SpO2 100 % 06/12/2018  6:25 PM  Vitals shown include unvalidated device data.  Last Pain:  Vitals:   06/12/18 1522  TempSrc:   PainSc: 6       Patients Stated Pain Goal: 4 (45/03/88 8280)  Complications: No apparent anesthesia complications

## 2018-06-12 NOTE — Progress Notes (Signed)
Contacted Dr. Serita Grit office and requested orders.

## 2018-06-13 ENCOUNTER — Encounter (HOSPITAL_COMMUNITY): Payer: Self-pay | Admitting: Ophthalmology

## 2018-06-13 NOTE — Anesthesia Postprocedure Evaluation (Signed)
Anesthesia Post Note  Patient: Anna Hayes  Procedure(s) Performed: CATARACT EXTRACTION WITH INTRAOCULAR LENS IMPLANT (Right Eye)     Patient location during evaluation: PACU Anesthesia Type: MAC Level of consciousness: awake and alert Pain management: pain level controlled Vital Signs Assessment: post-procedure vital signs reviewed and stable Respiratory status: spontaneous breathing Cardiovascular status: stable Anesthetic complications: no    Last Vitals:  Vitals:   06/12/18 1840 06/12/18 1855  BP: 113/60 (!) 115/57  Pulse: 72 63  Resp: 11 12  Temp:    SpO2: 97% 99%    Last Pain:  Vitals:   06/12/18 1855  TempSrc:   PainSc: 0-No pain                 Nolon Nations

## 2018-07-10 DIAGNOSIS — H34811 Central retinal vein occlusion, right eye, with macular edema: Secondary | ICD-10-CM | POA: Diagnosis not present

## 2018-08-06 DIAGNOSIS — E782 Mixed hyperlipidemia: Secondary | ICD-10-CM | POA: Diagnosis not present

## 2018-08-06 DIAGNOSIS — G43009 Migraine without aura, not intractable, without status migrainosus: Secondary | ICD-10-CM | POA: Diagnosis not present

## 2018-08-06 DIAGNOSIS — Z23 Encounter for immunization: Secondary | ICD-10-CM | POA: Diagnosis not present

## 2018-08-06 DIAGNOSIS — E890 Postprocedural hypothyroidism: Secondary | ICD-10-CM | POA: Diagnosis not present

## 2018-08-06 DIAGNOSIS — I69354 Hemiplegia and hemiparesis following cerebral infarction affecting left non-dominant side: Secondary | ICD-10-CM | POA: Diagnosis not present

## 2018-08-06 DIAGNOSIS — K219 Gastro-esophageal reflux disease without esophagitis: Secondary | ICD-10-CM | POA: Diagnosis not present

## 2018-08-06 DIAGNOSIS — I10 Essential (primary) hypertension: Secondary | ICD-10-CM | POA: Diagnosis not present

## 2018-08-15 DIAGNOSIS — H34811 Central retinal vein occlusion, right eye, with macular edema: Secondary | ICD-10-CM | POA: Diagnosis not present

## 2018-09-17 DIAGNOSIS — H34811 Central retinal vein occlusion, right eye, with macular edema: Secondary | ICD-10-CM | POA: Diagnosis not present

## 2018-10-26 DIAGNOSIS — Z1231 Encounter for screening mammogram for malignant neoplasm of breast: Secondary | ICD-10-CM | POA: Diagnosis not present

## 2018-11-08 ENCOUNTER — Encounter: Payer: Self-pay | Admitting: Physical Therapy

## 2018-11-08 NOTE — Therapy (Signed)
[facility] [address] [place], [place], [address] Phone: [phone]   Fax:  [phone]  Patient Details  Name: Anna Hayes MRN: [id number] Date of Birth: 1950/11/14 Referring Provider:  Alger Simons, MD  Encounter Date: 11/08/2018  PHYSICAL THERAPY DISCHARGE SUMMARY  Visits from Start of Care: 4  Current functional level related to goals / functional outcomes: See last PT note on 05/23/2018   Remaining deficits: See last PT note on 05/23/2018   Education / Equipment: Prosthetic care  Plan: Patient agrees to discharge.  Patient goals were not met. Patient is being discharged due to not returning since the last visit.  ?????       Treana Lacour PT, DPT 11/08/2018, 11:10 AM  [facility] [address] [place], [place], [address] Phone: [phone]   Fax:  [phone]

## 2018-11-09 DIAGNOSIS — Z89612 Acquired absence of left leg above knee: Secondary | ICD-10-CM | POA: Diagnosis not present

## 2018-11-09 DIAGNOSIS — E890 Postprocedural hypothyroidism: Secondary | ICD-10-CM | POA: Diagnosis not present

## 2018-11-09 DIAGNOSIS — E782 Mixed hyperlipidemia: Secondary | ICD-10-CM | POA: Diagnosis not present

## 2018-11-09 DIAGNOSIS — Z0000 Encounter for general adult medical examination without abnormal findings: Secondary | ICD-10-CM | POA: Diagnosis not present

## 2018-11-09 DIAGNOSIS — I10 Essential (primary) hypertension: Secondary | ICD-10-CM | POA: Diagnosis not present

## 2018-11-12 DIAGNOSIS — H34811 Central retinal vein occlusion, right eye, with macular edema: Secondary | ICD-10-CM | POA: Diagnosis not present

## 2019-02-08 DIAGNOSIS — E782 Mixed hyperlipidemia: Secondary | ICD-10-CM | POA: Diagnosis not present

## 2019-02-08 DIAGNOSIS — G43009 Migraine without aura, not intractable, without status migrainosus: Secondary | ICD-10-CM | POA: Diagnosis not present

## 2019-02-08 DIAGNOSIS — K219 Gastro-esophageal reflux disease without esophagitis: Secondary | ICD-10-CM | POA: Diagnosis not present

## 2019-02-08 DIAGNOSIS — I10 Essential (primary) hypertension: Secondary | ICD-10-CM | POA: Diagnosis not present

## 2019-02-08 DIAGNOSIS — I69354 Hemiplegia and hemiparesis following cerebral infarction affecting left non-dominant side: Secondary | ICD-10-CM | POA: Diagnosis not present

## 2019-02-08 DIAGNOSIS — E890 Postprocedural hypothyroidism: Secondary | ICD-10-CM | POA: Diagnosis not present

## 2019-02-20 IMAGING — DX DG ABD PORTABLE 1V
1 series · 1 of 1 positions shown · non-contrast
Comparison: None.

CLINICAL DATA: Abdominal pain and nausea.

EXAM:
PORTABLE ABDOMEN - 1 VIEW

[abdomen kub]
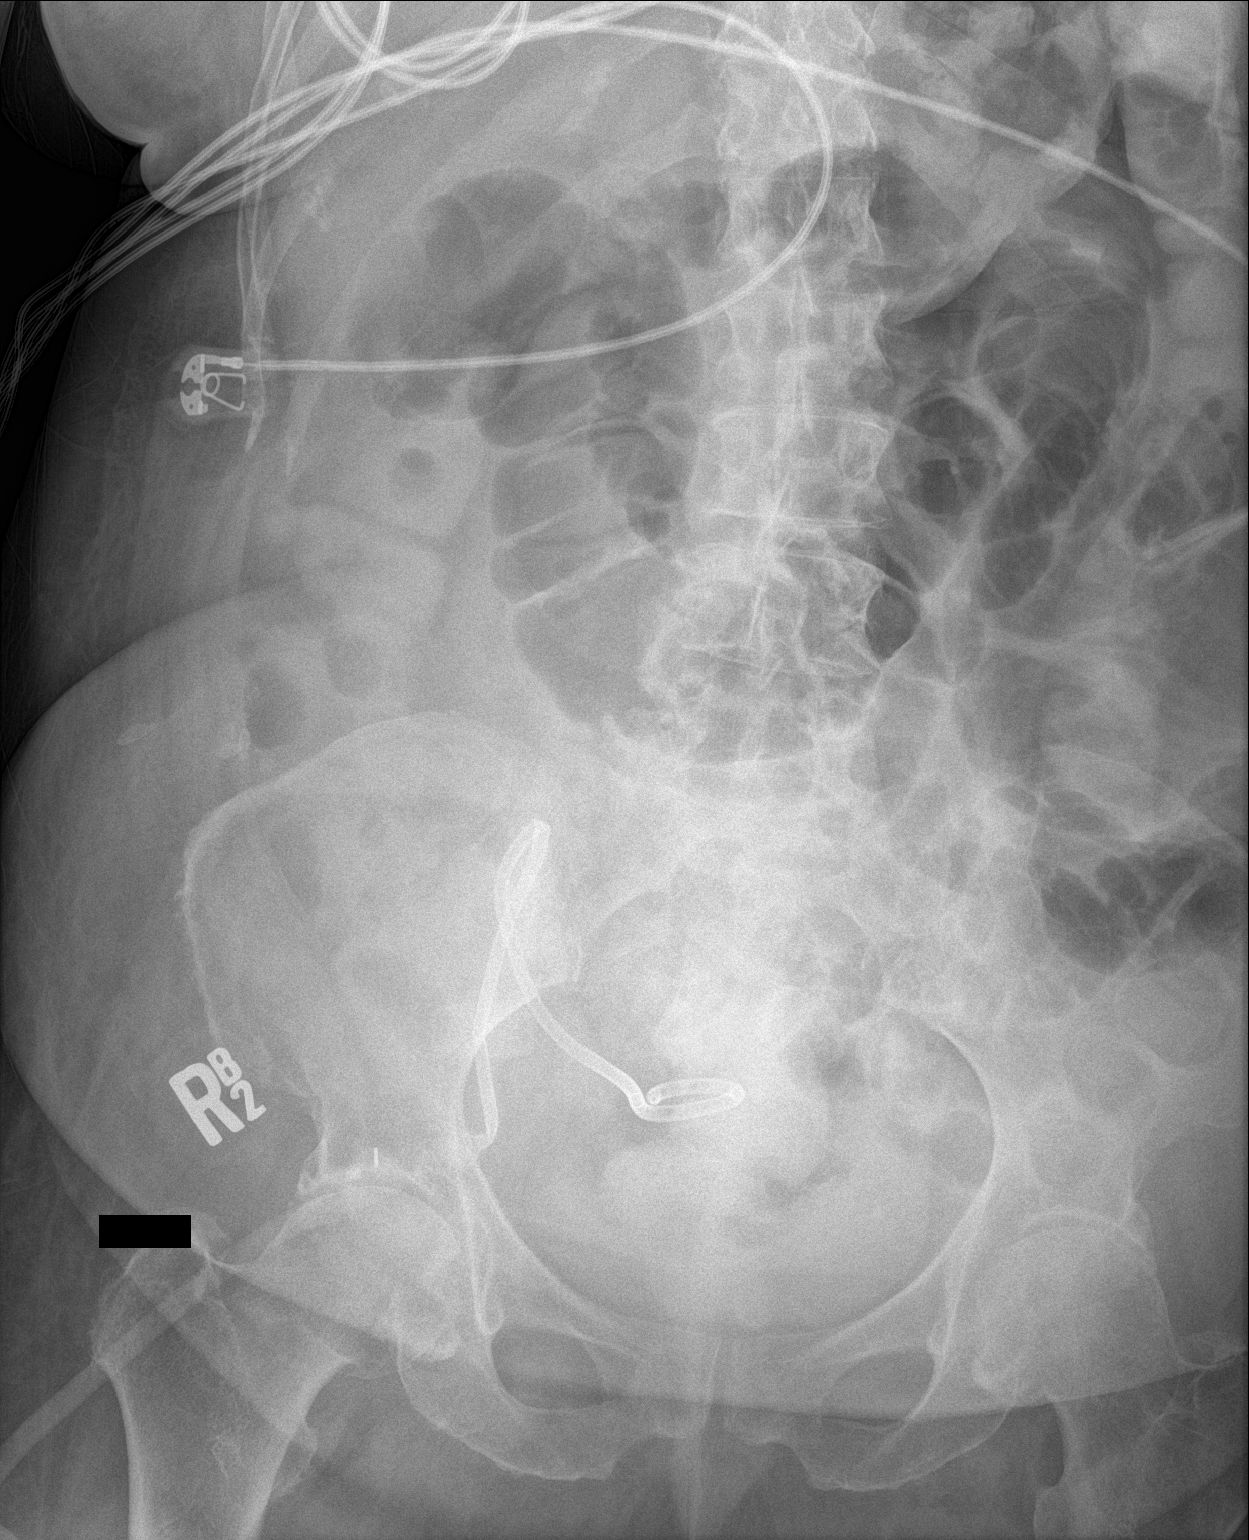

[1 of 1 positions shown; findings below may reference images not displayed]

FINDINGS: The bowel gas pattern is normal. No radio-opaque calculi or other
significant radiographic abnormality are seen. Pigtail drainage
catheter seen in the pelvis. Enteric contrast from prior CT.
Degenerative change both hips.
IMPRESSION: No obstruction or visible free air.

## 2019-03-04 IMAGING — CT CT ABD-PELV W/ CM
2 of 5 series · 16 of 46 positions shown, 18 images · IV contrast (APPLIED)
Comparison: 07/03/2017 CT abdomen and pelvis.

CLINICAL DATA: 66 y/o  F; evaluate abscess drain.

EXAM:
CT ABDOMEN AND PELVIS WITH CONTRAST
TECHNIQUE: Multidetector CT imaging of the abdomen and pelvis was performed
using the standard protocol following bolus administration of
intravenous contrast.
CONTRAST:  100mL VQ95BO-3BB IOPAMIDOL (VQ95BO-3BB) INJECTION 61%

[Series 4: abd/ pelvis 5.0 i30f 2 · axial · 0.98mm/px · z∈[+1006,+1456]mm · 13 of 100 slices shown, 15 images]
[im 5/100  soft-tissue]
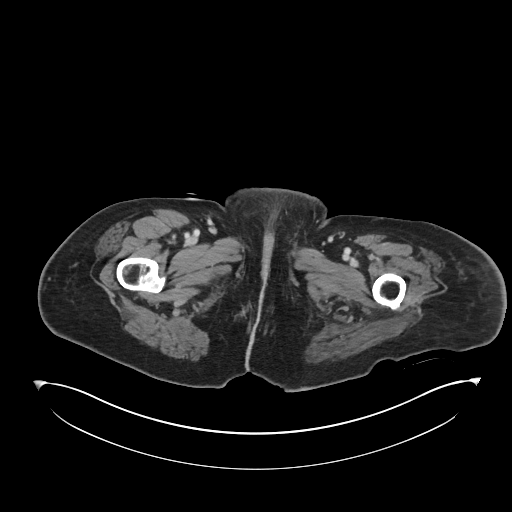
[im 5/100  bone]
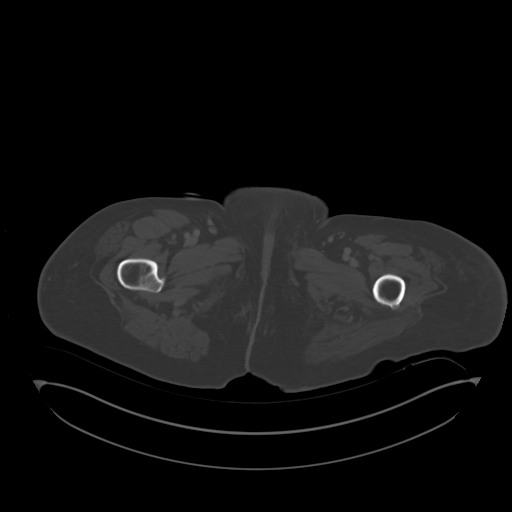
[im 15/100  soft-tissue]
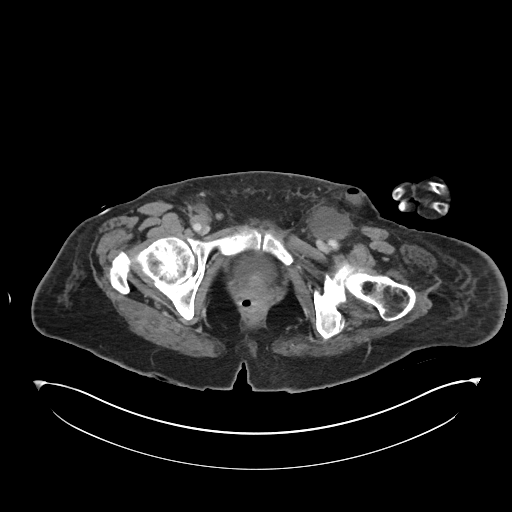
[im 20/100  soft-tissue]
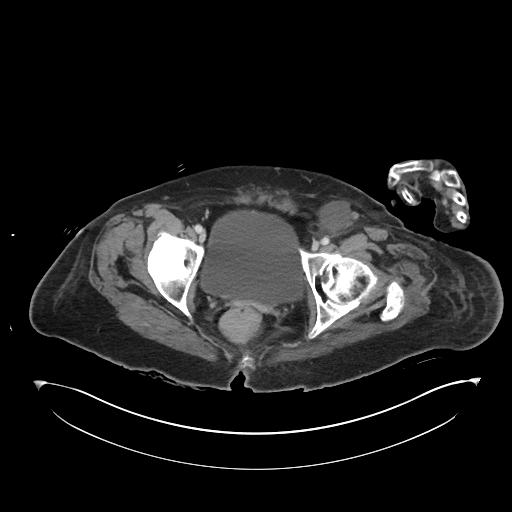
[im 30/100  soft-tissue]
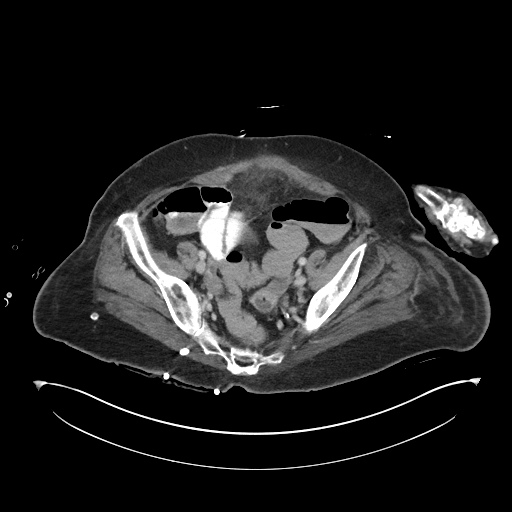
[im 35/100  soft-tissue]
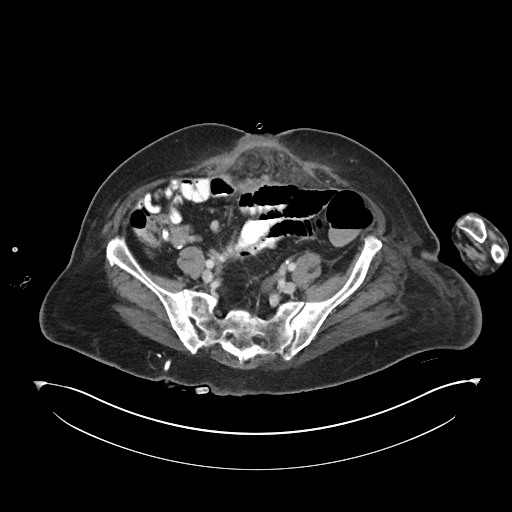
[im 45/100  soft-tissue]
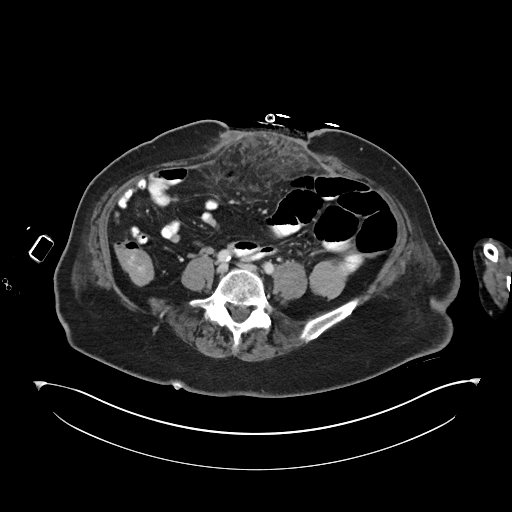
[im 50/100  soft-tissue]
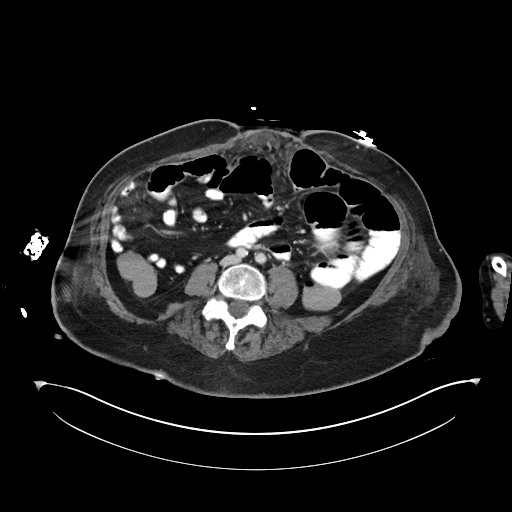
[im 55/100  soft-tissue]
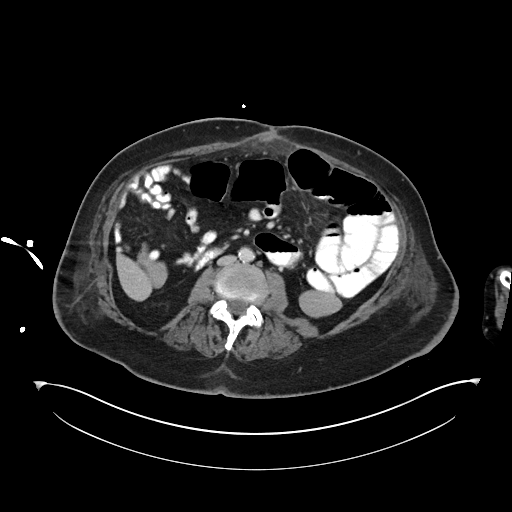
[im 65/100  soft-tissue]
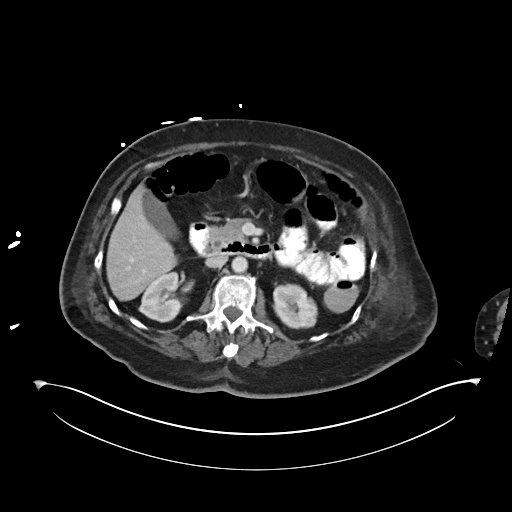
[im 65/100  bone]
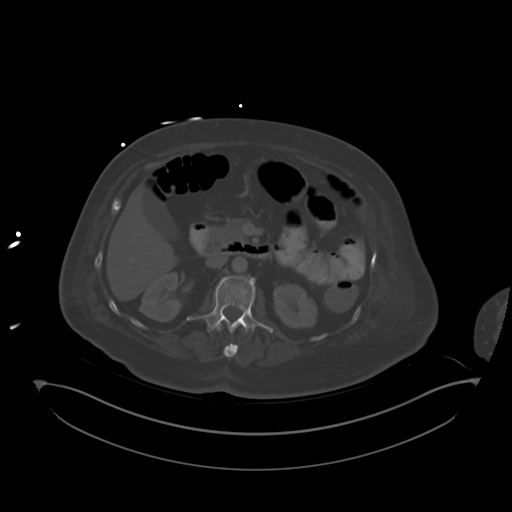
[im 70/100  soft-tissue]
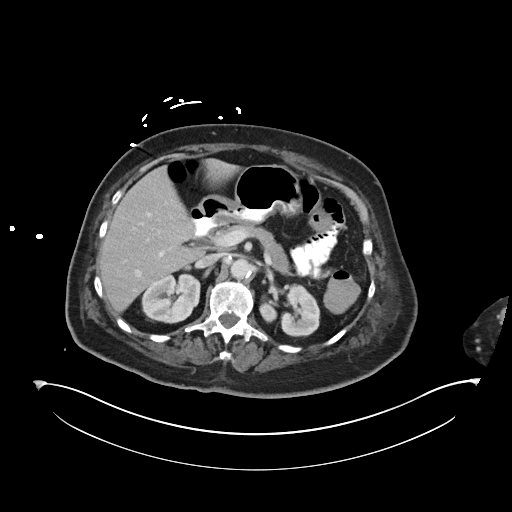
[im 80/100  soft-tissue]
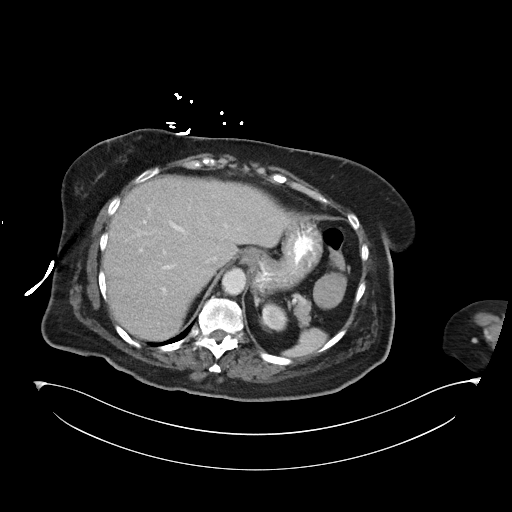
[im 85/100  soft-tissue]
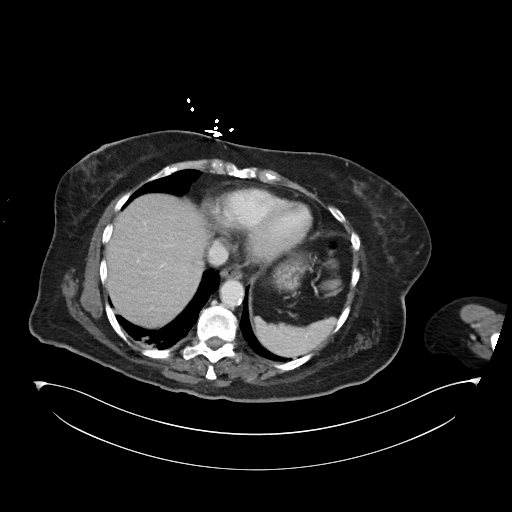
[im 95/100  soft-tissue]
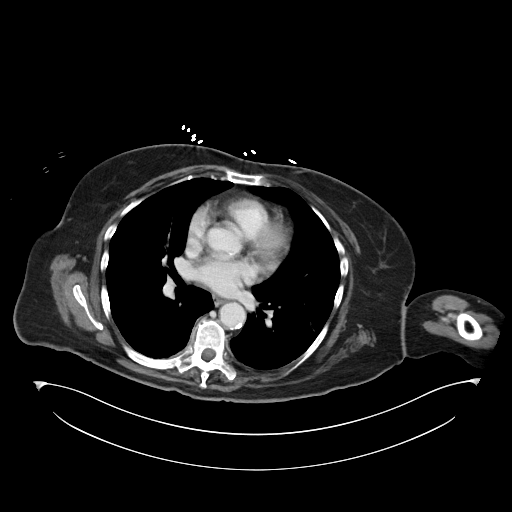

[Series 7: coronal soft tissue · coronal · 0.91mm/px · 3 of 99 slices shown]
[im 33/99  soft-tissue]
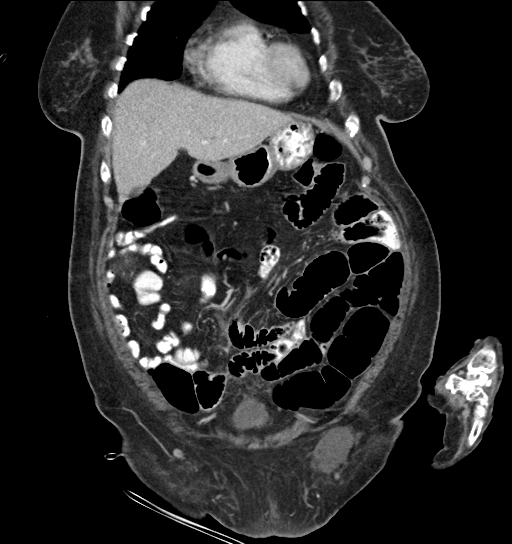
[im 44/99  soft-tissue]
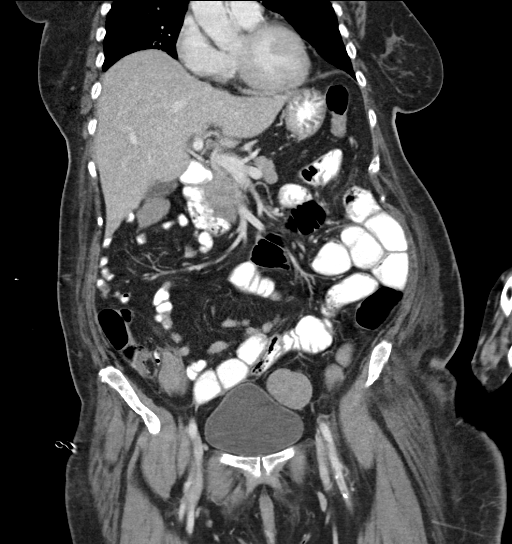
[im 55/99  soft-tissue]
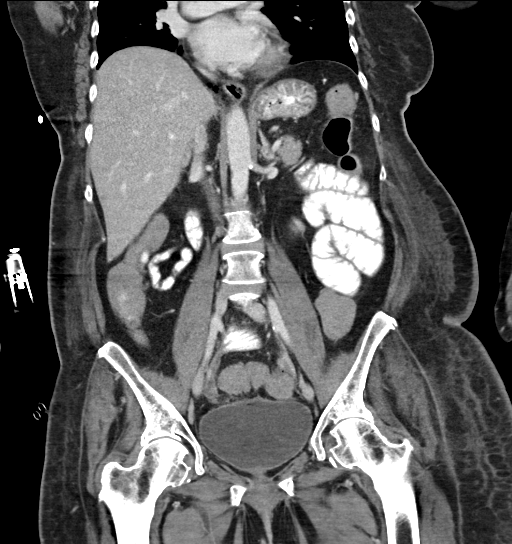

[16 of 46 positions shown; findings below may reference images not displayed]

FINDINGS: Lower chest: No acute abnormality.

Hepatobiliary: No focal liver abnormality is seen. No gallstones,
gallbladder wall thickening, or biliary dilatation.

Pancreas: Unremarkable. No pancreatic ductal dilatation or
surrounding inflammatory changes.

Spleen: Normal in size without focal abnormality.

Adrenals/Urinary Tract: Stable nodularity of left adrenal gland.
Stable right kidney upper pole cyst measuring 20 HU. No
hydronephrosis or urinary stone disease. Normal bladder.

Stomach/Bowel: Normal stomach. Interval increase proximal distention
of small bowel. Colon is unremarkable.

Vascular/Lymphatic: Aortic atherosclerosis. No enlarged abdominal or
pelvic lymph nodes.

Reproductive: Status post hysterectomy. No adnexal masses.

Other: Open midline lower abdominal wound. Decreased size of rim
enhancing collection deep to the wound at the peritoneal margin
measuring 18 x 51 mm (AP by ML series 4 : Image 61).

Stable rim enhancing collection in the right hemipelvis measuring 18
mm ([DATE]).

Decreased size of posterior pelvis rim enhancing collection
containing pigtail catheter measuring 14 x 31 mm (AP by ML [DATE]).

Fluid collection overlying the left femoral vessels without
enhancement is increased in size measuring 37 x 43 mm (AP by ML
[DATE]). Stable adjacent air fluid nonenhancing collection in
subcutaneous fat.

Musculoskeletal: No fracture is seen.
IMPRESSION: 1. Pelvis and lower abdominal rim enhancing fluid collections
compatible with abscess are stable to decreased in size. The abscess
in the posterior pelvis containing the drainage catheter is
decreased in size. No new abscess identified.
2. Increase in size of nonenhancing fluid collection overlying the
left femoral vessels, probably postoperative seroma.
3. Increased dilatation of proximal small bowel compatible with
obstruction.

By: Rtoyota Joshjax M.D.

## 2019-05-13 DIAGNOSIS — I10 Essential (primary) hypertension: Secondary | ICD-10-CM | POA: Diagnosis not present

## 2019-05-13 DIAGNOSIS — K219 Gastro-esophageal reflux disease without esophagitis: Secondary | ICD-10-CM | POA: Diagnosis not present

## 2019-05-13 DIAGNOSIS — R51 Headache: Secondary | ICD-10-CM | POA: Diagnosis not present

## 2019-05-13 DIAGNOSIS — E782 Mixed hyperlipidemia: Secondary | ICD-10-CM | POA: Diagnosis not present

## 2019-05-13 DIAGNOSIS — Z23 Encounter for immunization: Secondary | ICD-10-CM | POA: Diagnosis not present

## 2019-05-13 DIAGNOSIS — I69354 Hemiplegia and hemiparesis following cerebral infarction affecting left non-dominant side: Secondary | ICD-10-CM | POA: Diagnosis not present

## 2019-05-13 DIAGNOSIS — E890 Postprocedural hypothyroidism: Secondary | ICD-10-CM | POA: Diagnosis not present

## 2019-08-14 DIAGNOSIS — E890 Postprocedural hypothyroidism: Secondary | ICD-10-CM | POA: Diagnosis not present

## 2019-08-14 DIAGNOSIS — I69354 Hemiplegia and hemiparesis following cerebral infarction affecting left non-dominant side: Secondary | ICD-10-CM | POA: Diagnosis not present

## 2019-08-14 DIAGNOSIS — K219 Gastro-esophageal reflux disease without esophagitis: Secondary | ICD-10-CM | POA: Diagnosis not present

## 2019-08-14 DIAGNOSIS — I10 Essential (primary) hypertension: Secondary | ICD-10-CM | POA: Diagnosis not present

## 2019-08-14 DIAGNOSIS — E782 Mixed hyperlipidemia: Secondary | ICD-10-CM | POA: Diagnosis not present

## 2019-08-14 DIAGNOSIS — G43009 Migraine without aura, not intractable, without status migrainosus: Secondary | ICD-10-CM | POA: Diagnosis not present

## 2019-08-26 DIAGNOSIS — G43009 Migraine without aura, not intractable, without status migrainosus: Secondary | ICD-10-CM | POA: Diagnosis not present

## 2019-08-26 DIAGNOSIS — I10 Essential (primary) hypertension: Secondary | ICD-10-CM | POA: Diagnosis not present

## 2019-09-25 DIAGNOSIS — K219 Gastro-esophageal reflux disease without esophagitis: Secondary | ICD-10-CM | POA: Diagnosis not present

## 2019-09-25 DIAGNOSIS — I10 Essential (primary) hypertension: Secondary | ICD-10-CM | POA: Diagnosis not present

## 2019-10-22 DIAGNOSIS — I10 Essential (primary) hypertension: Secondary | ICD-10-CM | POA: Diagnosis not present

## 2019-10-22 DIAGNOSIS — I69354 Hemiplegia and hemiparesis following cerebral infarction affecting left non-dominant side: Secondary | ICD-10-CM | POA: Diagnosis not present

## 2019-10-22 DIAGNOSIS — G43009 Migraine without aura, not intractable, without status migrainosus: Secondary | ICD-10-CM | POA: Diagnosis not present

## 2019-10-22 DIAGNOSIS — E890 Postprocedural hypothyroidism: Secondary | ICD-10-CM | POA: Diagnosis not present

## 2019-10-22 DIAGNOSIS — K219 Gastro-esophageal reflux disease without esophagitis: Secondary | ICD-10-CM | POA: Diagnosis not present

## 2019-11-01 ENCOUNTER — Telehealth: Payer: Self-pay

## 2019-11-04 ENCOUNTER — Telehealth: Payer: Self-pay

## 2019-11-04 ENCOUNTER — Other Ambulatory Visit: Payer: Self-pay | Admitting: Family Medicine

## 2019-11-04 MED ORDER — RIVAROXABAN 20 MG PO TABS: 20.0000 mg | ORAL_TABLET | Freq: Every day | ORAL | 0 refills | Status: DC

## 2019-11-04 MED ORDER — LINZESS 145 MCG PO CAPS: 145.0000 ug | ORAL_CAPSULE | Freq: Every day | ORAL | 1 refills | Status: DC

## 2019-11-04 NOTE — Telephone Encounter (Signed)
Anna Hayes called requesting a refill on her xarelto 20 mg daily. She wants to pick-up a few samples since she is waiting for the RX to be sent.  She is going to pick-up Xarelto 10 mg #14  2 tablets daily.

## 2019-11-15 ENCOUNTER — Ambulatory Visit: Payer: Medicare Other | Admitting: Family Medicine

## 2019-11-18 ENCOUNTER — Other Ambulatory Visit: Payer: Self-pay

## 2019-11-18 ENCOUNTER — Ambulatory Visit (INDEPENDENT_AMBULATORY_CARE_PROVIDER_SITE_OTHER): Payer: Medicare Other | Admitting: Family Medicine

## 2019-11-18 ENCOUNTER — Encounter: Payer: Self-pay | Admitting: Family Medicine

## 2019-11-18 VITALS — BP 130/71 | HR 78 | Temp 98.0°F | Resp 16

## 2019-11-18 DIAGNOSIS — E038 Other specified hypothyroidism: Secondary | ICD-10-CM | POA: Diagnosis not present

## 2019-11-18 DIAGNOSIS — E782 Mixed hyperlipidemia: Secondary | ICD-10-CM

## 2019-11-18 DIAGNOSIS — Z720 Tobacco use: Secondary | ICD-10-CM

## 2019-11-18 DIAGNOSIS — I10 Essential (primary) hypertension: Secondary | ICD-10-CM

## 2019-11-18 DIAGNOSIS — S78119A Complete traumatic amputation at level between unspecified hip and knee, initial encounter: Secondary | ICD-10-CM

## 2019-11-18 DIAGNOSIS — I739 Peripheral vascular disease, unspecified: Secondary | ICD-10-CM

## 2019-11-18 DIAGNOSIS — G8114 Spastic hemiplegia affecting left nondominant side: Secondary | ICD-10-CM

## 2019-11-18 DIAGNOSIS — Z89612 Acquired absence of left leg above knee: Secondary | ICD-10-CM

## 2019-11-18 DIAGNOSIS — S78112A Complete traumatic amputation at level between left hip and knee, initial encounter: Secondary | ICD-10-CM

## 2019-11-18 MED ORDER — ATENOLOL 50 MG PO TABS: 25.0000 mg | ORAL_TABLET | Freq: Every day | ORAL | 1 refills | Status: DC

## 2019-11-18 MED ORDER — LEVOTHYROXINE SODIUM 25 MCG PO TABS: 25.0000 ug | ORAL_TABLET | Freq: Every day | ORAL | 1 refills | Status: DC

## 2019-11-18 NOTE — Progress Notes (Signed)
Subjective:  Patient ID: Anna Hayes, female    DOB: 25-Aug-1951  Age: 69 y.o. MRN: [id number]  Chief Complaint  Patient presents with  . Hypertension  . Hyperlipidemia  . Hypothyroidism    HPI  Patient presents with postprocedural hypothyroidism.  Date of diagnosis 1.  She is currently taking Synthroid, 25 mcg daily.  TSH was last checked 07/2019.  The result was reported as normal ( 3.73 mU/L ).      Additionally, she presents with history of migraines. When she has them they have a throbbing, and pounding character often with associated nausea and photophobia.  She has no obvious triggers for her headaches.  The pain improves with Bupap and Phenergan prn.     Pt presents for follow up of hypertension.  Date of diagnosis 2003.  Her current cardiac medication regimen includes a diuretic ( Lasix 20 mg QD prn ), a beta-blocker ( Atenolol 50 mg QD ), and an ACE inhibitor ( Lisinopril 20mg  daily ).  Review of her blood pressure log reveals systolics in the Q000111Q and diastolics in the Q000111Q. She has brought her bp cuff to compare it today and it appears to be accurate. She is tolerating the medication well and compliant with treatment taking her medication as directed. She eats healthy. Exercise is limited due to her left AKA.   GERD is well controlled with omeprazole.    Anna Hayes presents with a diagnosis of hemiplegia and hemiparesis following cerebral infarction affecting left non-dominant side.  She is able to move her amputated leg. Her left arm is affected by her stroke and she has minimal use of her hand.This was diagnosed 2003.  The course has been stable. She takes simvastatin and xarelto.    Pt presents with hyperlipidemia.  Date of diagnosis 2003.  Current treatment includes Zocor.  Compliance is good. She takes her medicines regularly and maintains her low cholesterol diet.  She follows up as directed, and maintains her exercise from her wheelchair as much as she can.   Patient  has PAD of BL extremities. She is on zocor and xarelto. She has a left AKA (06/2017.)  She has not quit smoking. Patient being followed by [facility].    Social Hx   Social History   Socioeconomic History  . Marital status: Widowed    Spouse name: Not on file  . Number of children: Not on file  . Years of education: Not on file  . Highest education level: Not on file  Occupational History  . Not on file  Tobacco Use  . Smoking status: Current Some Day Smoker    Packs/day: 0.50    Types: Cigarettes  . Smokeless tobacco: Never Used  Substance and Sexual Activity  . Alcohol use: No  . Drug use: No  . Sexual activity: Not on file  Other Topics Concern  . Not on file  Social History Narrative  . Not on file   Social Determinants of Health   Financial Resource Strain:   . Difficulty of Paying Living Expenses: Not on file  Food Insecurity:   . Worried About Charity fundraiser in the Last Year: Not on file  . Ran Out of Food in the Last Year: Not on file  Transportation Needs:   . Lack of Transportation (Medical): Not on file  . Lack of Transportation (Non-Medical): Not on file  Physical Activity:   . Days of Exercise per Week: Not on file  .  Minutes of Exercise per Session: Not on file  Stress:   . Feeling of Stress : Not on file  Social Connections:   . Frequency of Communication with Friends and Family: Not on file  . Frequency of Social Gatherings with Friends and Family: Not on file  . Attends Religious Services: Not on file  . Active Member of Clubs or Organizations: Not on file  . Attends Archivist Meetings: Not on file  . Marital Status: Not on file   Past Medical History:  Diagnosis Date  . Anemia   . Constipation   . DVT of lower extremity (deep venous thrombosis) ([place])   . Frequent headaches   . Heart murmur    found during pregnancy -  no problems  . Hypertension   . Muscle pain   . Palpitations   . Septic  thrombophlebitis 08/31/2017  . Sinus problem   . Stroke [facility])     Review of Systems  Constitutional: Negative for chills and fever.  HENT: Positive for sinus pain. Negative for congestion, ear pain and sore throat.   Respiratory: Negative for cough and shortness of breath.   Cardiovascular: Negative for chest pain and leg swelling.  Gastrointestinal: Negative for abdominal pain, constipation, diarrhea, nausea and vomiting.  Endocrine: Positive for polyuria. Negative for polydipsia and polyphagia.  Genitourinary: Negative for dysuria, flank pain, hematuria and urgency.  Musculoskeletal: Negative for arthralgias and myalgias.  Neurological: Positive for headaches. Negative for dizziness.  Psychiatric/Behavioral: Negative for dysphoric mood. The patient is not nervous/anxious.      Objective:  BP 130/71 (BP Location: Left Arm, Patient Position: Sitting, Cuff Size: Normal)   Pulse 78   Temp 98 F (36.7 C)   Resp 16   BP/Weight 11/18/2019 06/12/2018 0000000  Systolic BP [id number] 0000000  Diastolic BP 71 57 57  Wt. (Lbs) - 158.44 -  BMI - 28.98 -    Physical Exam Vitals reviewed.  Constitutional:      General: She is not in acute distress.    Appearance: Normal appearance.     Comments: Overweight.  Neck:     Thyroid: No thyroid mass.     Vascular: No carotid bruit.  Cardiovascular:     Rate and Rhythm: Normal rate and regular rhythm.     Heart sounds: No murmur.  Pulmonary:     Effort: Pulmonary effort is normal.     Breath sounds: Normal breath sounds.  Abdominal:     General: Bowel sounds are normal.     Palpations: Abdomen is soft. There is no mass.     Tenderness: There is no abdominal tenderness.  Musculoskeletal:     Comments: Left AKA. Left arm had decreased function. Left hand has contracture.   Skin:    General: Skin is warm and dry.  Neurological:     Mental Status: She is alert and oriented to person, place, and time.     Cranial Nerves: No cranial nerve  deficit.  Psychiatric:        Mood and Affect: Mood normal.        Behavior: Behavior normal.     Lab Results  Component Value Date   WBC 8.7 11/18/2019   HGB 10.9 (L) 11/18/2019   HCT 33.9 (L) 11/18/2019   PLT 492 (H) 11/18/2019   GLUCOSE 92 11/18/2019   CHOL 163 11/18/2019   TRIG 178 (H) 11/18/2019   HDL 45 11/18/2019   LDLCALC 88 11/18/2019   ALT 11 08/31/2017  AST 24 11/18/2019   NA 137 11/18/2019   K 5.2 11/18/2019   CL 103 11/18/2019   CREATININE 0.92 11/18/2019   BUN 12 11/18/2019   CO2 25 06/12/2018   INR 1.21 06/12/2018      Assessment & Plan:  PVD (peripheral vascular disease) (HCC) Severe disease.Treated with BL femoral popliteal embolectomy in 2018. Continue on statin and xarelto. Recommended quit smoking! She is trying to decrease.  Continue to work on eating a healthy diet. Labs drawn today.   Benign essential HTN Well controlled.  No changes to medicines.  Continue to work on eating a healthy diet and chair exercises.  Labs drawn today.   Secondary hypothyroidism Well controlled.  No changes to medicines.  Labs drawn today.   Spastic hemiparesis of left nondominant side (HCC) Stable. Patient is able to accomplish ADLs. Continue statin and xarelto for prevention of recurrent strokes. Strongly recommended quit smoking.  Mixed hyperlipidemia Well controlled.  No changes to medicines.  Continue to work on eating a healthy diet and work on chair exercises.  Labs drawn today.   Amputation of left lower extremity above knee upon examination ([place]) Continue good skin care. Follows with vascular and vein.  Meds ordered this encounter  Medications  . atenolol (TENORMIN) 50 MG tablet    Sig: Take 0.5 tablets (25 mg total) by mouth daily.    Dispense:  90 tablet    Refill:  1  . DISCONTD: levothyroxine (SYNTHROID) 25 MCG tablet    Sig: Take 1 tablet (25 mcg total) by mouth daily.    Dispense:  90 tablet    Refill:  1  . levothyroxine  (SYNTHROID) 25 MCG tablet    Sig: Take 1 tablet (25 mcg total) by mouth daily.    Dispense:  90 tablet    Refill:  1   Follow up in 3 months. Educated on reasons to quit smoking!  Rochel Brome [facility] [phone]

## 2019-11-18 NOTE — Patient Instructions (Addendum)
NO changes to current medications BP well controlled.  Hyperlipidemia well controlled. Refills of atenolol and synthroid given. Recommended quit smoking!

## 2019-11-19 LAB — CBC WITH DIFFERENTIAL/PLATELET
Basophils Absolute: 0 10*3/uL (ref 0.0–0.2)
Basos: 1 %
EOS (ABSOLUTE): 0.3 10*3/uL (ref 0.0–0.4)
Eos: 4 %
Hematocrit: 33.9 % — ABNORMAL LOW (ref 34.0–46.6)
Hemoglobin: 10.9 g/dL — ABNORMAL LOW (ref 11.1–15.9)
Immature Grans (Abs): 0.1 10*3/uL (ref 0.0–0.1)
Immature Granulocytes: 1 %
Lymphocytes Absolute: 2.8 10*3/uL (ref 0.7–3.1)
Lymphs: 32 %
MCH: 25.5 pg — ABNORMAL LOW (ref 26.6–33.0)
MCHC: 32.2 g/dL (ref 31.5–35.7)
MCV: 79 fL (ref 79–97)
Monocytes Absolute: 0.8 10*3/uL (ref 0.1–0.9)
Monocytes: 10 %
Neutrophils Absolute: 4.7 10*3/uL (ref 1.4–7.0)
Neutrophils: 52 %
Platelets: 492 10*3/uL — ABNORMAL HIGH (ref 150–450)
RBC: 4.27 x10E6/uL (ref 3.77–5.28)
RDW: 16 % — ABNORMAL HIGH (ref 11.7–15.4)
WBC: 8.7 10*3/uL (ref 3.4–10.8)

## 2019-11-19 LAB — COMP. METABOLIC PANEL (12)
AST: 24 IU/L (ref 0–40)
Albumin/Globulin Ratio: 1.3 (ref 1.2–2.2)
Albumin: 4.1 g/dL (ref 3.8–4.8)
Alkaline Phosphatase: 99 IU/L (ref 39–117)
BUN/Creatinine Ratio: 13 (ref 12–28)
BUN: 12 mg/dL (ref 8–27)
Bilirubin Total: <0.2 mg/dL (ref 0.0–1.2)
Calcium: 9.5 mg/dL (ref 8.7–10.3)
Chloride: 103 mmol/L (ref 96–106)
Creatinine, Ser: 0.92 mg/dL (ref 0.57–1.00)
GFR calc Af Amer: 73 mL/min/{1.73_m2} (ref >59)
GFR calc non Af Amer: 63 mL/min/{1.73_m2} (ref >59)
Globulin, Total: 3.1 g/dL (ref 1.5–4.5)
Glucose: 92 mg/dL (ref 65–99)
Potassium: 5.2 mmol/L (ref 3.5–5.2)
Sodium: 137 mmol/L (ref 134–144)
Total Protein: 7.2 g/dL (ref 6.0–8.5)

## 2019-11-19 LAB — LIPID PANEL
Chol/HDL Ratio: 3.6 ratio (ref 0.0–4.4)
Cholesterol, Total: 163 mg/dL (ref 100–199)
HDL: 45 mg/dL (ref >39)
LDL Chol Calc (NIH): 88 mg/dL (ref 0–99)
Triglycerides: 178 mg/dL — ABNORMAL HIGH (ref 0–149)
VLDL Cholesterol Cal: 30 mg/dL (ref 5–40)

## 2019-11-19 LAB — CARDIOVASCULAR RISK ASSESSMENT

## 2019-11-28 ENCOUNTER — Other Ambulatory Visit: Payer: Self-pay

## 2019-11-28 MED ORDER — ATENOLOL 50 MG PO TABS: 25.0000 mg | ORAL_TABLET | Freq: Every day | ORAL | 1 refills | Status: DC

## 2019-11-28 NOTE — Telephone Encounter (Signed)
Message was sent provider on 11/04/2019

## 2019-12-01 DIAGNOSIS — Z89612 Acquired absence of left leg above knee: Secondary | ICD-10-CM | POA: Insufficient documentation

## 2019-12-01 NOTE — Assessment & Plan Note (Signed)
Continue good skin care. Follows with vascular and vein.

## 2019-12-01 NOTE — Assessment & Plan Note (Signed)
Well controlled.  No changes to medicines.    Labs drawn today.  

## 2019-12-01 NOTE — Assessment & Plan Note (Signed)
Well controlled.  No changes to medicines.  Continue to work on eating a healthy diet and work on chair exercises.  Labs drawn today.

## 2019-12-01 NOTE — Assessment & Plan Note (Signed)
Severe disease.Treated with BL femoral popliteal embolectomy in 2018. Continue on statin and xarelto. Recommended quit smoking! She is trying to decrease.  Continue to work on eating a healthy diet. Labs drawn today.

## 2019-12-01 NOTE — Assessment & Plan Note (Addendum)
Stable. Patient is able to accomplish ADLs. Continue statin and xarelto for prevention of recurrent strokes. Strongly recommended quit smoking.

## 2019-12-01 NOTE — Assessment & Plan Note (Signed)
Well controlled.  No changes to medicines.  Continue to work on eating a healthy diet and chair exercises.  Labs drawn today.

## 2019-12-06 ENCOUNTER — Encounter: Payer: Self-pay | Admitting: Family Medicine

## 2019-12-06 DIAGNOSIS — N959 Unspecified menopausal and perimenopausal disorder: Secondary | ICD-10-CM | POA: Diagnosis not present

## 2019-12-06 DIAGNOSIS — M810 Age-related osteoporosis without current pathological fracture: Secondary | ICD-10-CM | POA: Diagnosis not present

## 2019-12-06 DIAGNOSIS — Z1231 Encounter for screening mammogram for malignant neoplasm of breast: Secondary | ICD-10-CM | POA: Diagnosis not present

## 2019-12-12 ENCOUNTER — Other Ambulatory Visit: Payer: Self-pay | Admitting: Family Medicine

## 2019-12-12 MED ORDER — ATENOLOL 50 MG PO TABS: 50.0000 mg | ORAL_TABLET | Freq: Every day | ORAL | 1 refills | Status: DC

## 2019-12-18 ENCOUNTER — Other Ambulatory Visit: Payer: Self-pay | Admitting: Family Medicine

## 2019-12-18 DIAGNOSIS — I10 Essential (primary) hypertension: Secondary | ICD-10-CM

## 2019-12-26 NOTE — Therapy (Signed)
[name] [address] [place], [place], [address] Phone: [phone]   Fax:  [phone]  Patient Details  Name: Anna Hayes MRN: [id number] Date of Birth: Sep 30, 1950 Referring Provider:  Meredith Staggers, MD  Encounter Date: 04/10/2018   [name] 12/26/2019, [facility] [address] [place], [place], [address] Phone: [phone]   Fax:  [phone]

## 2019-12-30 ENCOUNTER — Other Ambulatory Visit: Payer: Self-pay

## 2019-12-30 MED ORDER — BUTALBITAL-APAP-CAFFEINE 50-325-40 MG PO TABS: 1.0000 | ORAL_TABLET | Freq: Four times a day (QID) | ORAL | 2 refills | Status: DC | PRN

## 2020-01-13 ENCOUNTER — Other Ambulatory Visit: Payer: Self-pay

## 2020-01-13 MED ORDER — PROMETHAZINE HCL 25 MG PO TABS: 25.0000 mg | ORAL_TABLET | Freq: Four times a day (QID) | ORAL | 1 refills | Status: DC | PRN

## 2020-01-26 ENCOUNTER — Other Ambulatory Visit: Payer: Self-pay | Admitting: Family Medicine

## 2020-01-28 ENCOUNTER — Other Ambulatory Visit: Payer: Self-pay

## 2020-01-28 MED ORDER — BUTALBITAL-APAP-CAFFEINE 50-325-40 MG PO TABS: 1.0000 | ORAL_TABLET | Freq: Four times a day (QID) | ORAL | 2 refills | Status: DC | PRN

## 2020-01-28 MED ORDER — SIMVASTATIN 40 MG PO TABS: 40.0000 mg | ORAL_TABLET | Freq: Every day | ORAL | 0 refills | Status: DC

## 2020-02-12 ENCOUNTER — Other Ambulatory Visit: Payer: Self-pay

## 2020-02-12 DIAGNOSIS — E038 Other specified hypothyroidism: Secondary | ICD-10-CM

## 2020-02-12 MED ORDER — LEVOTHYROXINE SODIUM 25 MCG PO TABS: 25.0000 ug | ORAL_TABLET | Freq: Every day | ORAL | 1 refills | Status: DC

## 2020-02-18 NOTE — Progress Notes (Signed)
Subjective:  Patient ID: Anna Hayes, female    DOB: 1951-02-19  Age: 69 y.o. MRN: [id number]  Chief Complaint  Patient presents with  . Hypertension  . Gastroesophageal Reflux  . Hypothyroidism    HPI Patient presents with postprocedural hypothyroidism.  Date of diagnosis 52.  She is currently taking Synthroid, 25 mcg daily.  TSH was last checked 07/2019.  The result was reported as normal ( 3.73 mU/L ).      Additionally, she presents with history of migraines. Has 2-3 times per week.  When she has them they have a throbbing, pounding HA with nausea and photophobia.  She has no obvious triggers for her headaches.  The pain improves with tylenol, Bupap and Phenergan prn.      Pt presents for follow up of hypertension.  Date of diagnosis 2003.  Her current cardiac medication regimen includes a diuretic ( Lasix 20 mg QD prn ), a beta-blocker ( Atenolol 50 mg QD ), and an ACE inhibitor ( Lisinopril 20mg  daily ).  Review of her blood pressure log reveals systolics in the Q000111Q and diastolics in the Q000111Q. She has brought her bp cuff to compare it today and it appears to be accurate. She is tolerating the medication well and compliant with treatment taking her medication as directed. She eats healthy. Exercise is limited due to her left AKA.   GERD is well controlled with omeprazole.    Anna Hayes presents with a diagnosis of hemiplegia and hemiparesis following cerebral infarction affecting left non-dominant side.  She is able to move her amputated leg. Her left arm is affected by her stroke and she has minimal use of her hand.This was diagnosed 2003.  The course has been stable. She takes simvastatin and xarelto.     Pt presents with hyperlipidemia.  Date of diagnosis 2003.  Current treatment includes Zocor.  Compliance is good. She takes her medicines regularly and maintains her low cholesterol diet.  She follows up as directed, and maintains her exercise from her wheelchair as much as she  can.   Patient has PAD of BL extremities. She is on zocor and xarelto. She has a left AKA (06/2017.)  She has not quit smoking but has decreased to 1 ppd. She is trying to cut down herself. Patient being followed by [facility].  Patient was taking gabapentin, but it made her swell, so she stopped it.  Social Hx   Social History   Socioeconomic History  . Marital status: Widowed    Spouse name: Not on file  . Number of children: Not on file  . Years of education: Not on file  . Highest education level: Not on file  Occupational History  . Not on file  Tobacco Use  . Smoking status: Current Some Day Smoker    Packs/day: 0.50    Types: Cigarettes  . Smokeless tobacco: Never Used  Vaping Use  . Vaping Use: Never used  Substance and Sexual Activity  . Alcohol use: No  . Drug use: No  . Sexual activity: Not on file  Other Topics Concern  . Not on file  Social History Narrative  . Not on file   Social Determinants of Health   Financial Resource Strain:   . Difficulty of Paying Living Expenses:   Food Insecurity:   . Worried About Charity fundraiser in the Last Year:   . Arboriculturist in the Last Year:   Transportation Needs:   .  Lack of Transportation (Medical):   Marland Kitchen Lack of Transportation (Non-Medical):   Physical Activity:   . Days of Exercise per Week:   . Minutes of Exercise per Session:   Stress:   . Feeling of Stress :   Social Connections:   . Frequency of Communication with Friends and Family:   . Frequency of Social Gatherings with Friends and Family:   . Attends Religious Services:   . Active Member of Clubs or Organizations:   . Attends Archivist Meetings:   Marland Kitchen Marital Status:    Past Medical History:  Diagnosis Date  . Anemia   . Constipation   . DVT of lower extremity (deep venous thrombosis) ([place])   . Frequent headaches   . Heart murmur    found during pregnancy -  no problems  . Hypertension   . Muscle pain   .  Palpitations   . Septic thrombophlebitis 08/31/2017  . Sinus problem   . Stroke [facility])     Review of Systems  Constitutional: Negative for chills, fatigue and fever.  HENT: Negative for congestion, ear pain, rhinorrhea, sinus pain and sore throat.   Respiratory: Negative for cough and shortness of breath.   Cardiovascular: Negative for chest pain.  Gastrointestinal: Negative for abdominal pain, constipation, diarrhea, nausea and vomiting.  Endocrine: Positive for polyuria. Negative for polydipsia and polyphagia.  Genitourinary: Negative for dysuria and urgency.  Musculoskeletal: Negative for arthralgias, back pain and myalgias.  Neurological: Positive for headaches. Negative for dizziness, weakness and light-headedness.  Psychiatric/Behavioral: Negative for dysphoric mood. The patient is not nervous/anxious.      Objective:  BP (!) 122/56   Pulse 69   Temp (!) 97.3 F (36.3 C)   SpO2 99%   BP/Weight 02/19/2020 11/18/2019 Q000111Q  Systolic BP [id number]  Diastolic BP 56 71 57  Wt. (Lbs) - - 158.44  BMI - - 28.98    Physical Exam Vitals reviewed.  Constitutional:      General: She is not in acute distress.    Appearance: Normal appearance. She is normal weight.     Comments: Overweight.  Neck:     Thyroid: No thyroid mass.     Vascular: No carotid bruit.  Cardiovascular:     Rate and Rhythm: Normal rate and regular rhythm.     Pulses: Normal pulses.     Heart sounds: Normal heart sounds. No murmur.  Pulmonary:     Effort: Pulmonary effort is normal. No respiratory distress.     Breath sounds: Normal breath sounds.  Abdominal:     General: Abdomen is flat. Bowel sounds are normal.     Palpations: Abdomen is soft. There is no mass.     Tenderness: There is no abdominal tenderness.  Musculoskeletal:     Comments: Left AKA. Left arm had decreased function. Left hand has contracture.   Skin:    General: Skin is warm and dry.  Neurological:     Mental Status: She is  alert and oriented to person, place, and time.     Cranial Nerves: No cranial nerve deficit.  Psychiatric:        Mood and Affect: Mood normal.        Behavior: Behavior normal.     Lab Results  Component Value Date   WBC 9.6 02/19/2020   HGB 10.2 (L) 02/19/2020   HCT 30.3 (L) 02/19/2020   PLT 438 02/19/2020   GLUCOSE 94 02/19/2020   CHOL 158 02/19/2020  TRIG 183 (H) 02/19/2020   HDL 42 02/19/2020   LDLCALC 85 02/19/2020   ALT 24 02/19/2020   AST 26 02/19/2020   NA 136 02/19/2020   K 5.4 (H) 02/19/2020   CL 102 02/19/2020   CREATININE 0.99 02/19/2020   BUN 13 02/19/2020   CO2 21 02/19/2020   TSH 2.710 02/19/2020   INR 1.21 06/12/2018      Assessment & Plan:  1. Benign essential HTN Well controlled.  No changes to medicines.  Continue to work on eating a healthy diet and exercise.  Labs drawn today.  - CBC with Differential/Platelet - Comprehensive metabolic panel  2. Mixed hyperlipidemia Well controlled.  No changes to medicines.  Continue to work on eating a healthy diet and exercise.  Labs drawn today.  - Lipid panel  3. Secondary hypothyroidism - TSH  4. Spastic hemiparesis of left nondominant side ([place])  Patient accomodates well.  5. Amputation of left lower extremity above knee upon examination [facility])  Patient accomodates well.  6. Migraine without aura and with status migrainosus, not intractable .The current medical regimen is effective;  continue present plan and medications. . Meds ordered this encounter  Medications  . gabapentin (NEURONTIN) 100 MG capsule    Sig: Take 1 capsule (100 mg total) by mouth 3 (three) times daily.    Dispense:  270 capsule    Refill:  0   Follow up in 3 months. Educated on reasons to quit smoking!  Rochel Brome [facility] [phone]

## 2020-02-19 ENCOUNTER — Ambulatory Visit (INDEPENDENT_AMBULATORY_CARE_PROVIDER_SITE_OTHER): Payer: Medicare Other | Admitting: Family Medicine

## 2020-02-19 ENCOUNTER — Other Ambulatory Visit: Payer: Self-pay

## 2020-02-19 ENCOUNTER — Encounter: Payer: Self-pay | Admitting: Family Medicine

## 2020-02-19 VITALS — BP 122/56 | HR 69 | Temp 97.3°F

## 2020-02-19 DIAGNOSIS — I10 Essential (primary) hypertension: Secondary | ICD-10-CM | POA: Diagnosis not present

## 2020-02-19 DIAGNOSIS — G8114 Spastic hemiplegia affecting left nondominant side: Secondary | ICD-10-CM

## 2020-02-19 DIAGNOSIS — E782 Mixed hyperlipidemia: Secondary | ICD-10-CM

## 2020-02-19 DIAGNOSIS — G43001 Migraine without aura, not intractable, with status migrainosus: Secondary | ICD-10-CM

## 2020-02-19 DIAGNOSIS — E038 Other specified hypothyroidism: Secondary | ICD-10-CM

## 2020-02-19 DIAGNOSIS — S78112A Complete traumatic amputation at level between left hip and knee, initial encounter: Secondary | ICD-10-CM | POA: Diagnosis not present

## 2020-02-19 MED ORDER — GABAPENTIN 100 MG PO CAPS: 100.0000 mg | ORAL_CAPSULE | Freq: Three times a day (TID) | ORAL | 0 refills | Status: DC

## 2020-02-20 LAB — CARDIOVASCULAR RISK ASSESSMENT

## 2020-02-20 LAB — CBC WITH DIFFERENTIAL/PLATELET
Basophils Absolute: 0 10*3/uL (ref 0.0–0.2)
Basos: 0 %
EOS (ABSOLUTE): 0.6 10*3/uL — ABNORMAL HIGH (ref 0.0–0.4)
Eos: 7 %
Hematocrit: 30.3 % — ABNORMAL LOW (ref 34.0–46.6)
Hemoglobin: 10.2 g/dL — ABNORMAL LOW (ref 11.1–15.9)
Immature Grans (Abs): 0.1 10*3/uL (ref 0.0–0.1)
Immature Granulocytes: 1 %
Lymphocytes Absolute: 2.6 10*3/uL (ref 0.7–3.1)
Lymphs: 27 %
MCH: 25.8 pg — ABNORMAL LOW (ref 26.6–33.0)
MCHC: 33.7 g/dL (ref 31.5–35.7)
MCV: 77 fL — ABNORMAL LOW (ref 79–97)
Monocytes Absolute: 1.2 10*3/uL — ABNORMAL HIGH (ref 0.1–0.9)
Monocytes: 13 %
Neutrophils Absolute: 5 10*3/uL (ref 1.4–7.0)
Neutrophils: 52 %
Platelets: 438 10*3/uL (ref 150–450)
RBC: 3.96 x10E6/uL (ref 3.77–5.28)
RDW: 15.7 % — ABNORMAL HIGH (ref 11.7–15.4)
WBC: 9.6 10*3/uL (ref 3.4–10.8)

## 2020-02-20 LAB — LIPID PANEL
Chol/HDL Ratio: 3.8 ratio (ref 0.0–4.4)
Cholesterol, Total: 158 mg/dL (ref 100–199)
HDL: 42 mg/dL (ref >39)
LDL Chol Calc (NIH): 85 mg/dL (ref 0–99)
Triglycerides: 183 mg/dL — ABNORMAL HIGH (ref 0–149)
VLDL Cholesterol Cal: 31 mg/dL (ref 5–40)

## 2020-02-20 LAB — COMPREHENSIVE METABOLIC PANEL
ALT: 24 IU/L (ref 0–32)
AST: 26 IU/L (ref 0–40)
Albumin/Globulin Ratio: 1.4 (ref 1.2–2.2)
Albumin: 4.2 g/dL (ref 3.7–4.7)
Alkaline Phosphatase: 111 IU/L (ref 48–121)
BUN/Creatinine Ratio: 13 (ref 12–28)
BUN: 13 mg/dL (ref 8–27)
Bilirubin Total: <0.2 mg/dL (ref 0.0–1.2)
CO2: 21 mmol/L (ref 20–29)
Calcium: 9.3 mg/dL (ref 8.7–10.3)
Chloride: 102 mmol/L (ref 96–106)
Creatinine, Ser: 0.99 mg/dL (ref 0.57–1.00)
GFR calc Af Amer: 66 mL/min/{1.73_m2} (ref >59)
GFR calc non Af Amer: 58 mL/min/{1.73_m2} — ABNORMAL LOW (ref >59)
Globulin, Total: 3.1 g/dL (ref 1.5–4.5)
Glucose: 94 mg/dL (ref 65–99)
Potassium: 5.4 mmol/L — ABNORMAL HIGH (ref 3.5–5.2)
Sodium: 136 mmol/L (ref 134–144)
Total Protein: 7.3 g/dL (ref 6.0–8.5)

## 2020-02-20 LAB — TSH: TSH: 2.71 u[IU]/mL (ref 0.450–4.500)

## 2020-02-28 ENCOUNTER — Other Ambulatory Visit: Payer: Self-pay | Admitting: Physician Assistant

## 2020-03-08 ENCOUNTER — Encounter: Payer: Self-pay | Admitting: Family Medicine

## 2020-03-19 ENCOUNTER — Other Ambulatory Visit: Payer: Self-pay | Admitting: Family Medicine

## 2020-03-19 DIAGNOSIS — I10 Essential (primary) hypertension: Secondary | ICD-10-CM

## 2020-04-22 ENCOUNTER — Other Ambulatory Visit: Payer: Self-pay

## 2020-04-22 MED ORDER — SIMVASTATIN 40 MG PO TABS: 40.0000 mg | ORAL_TABLET | Freq: Every day | ORAL | 0 refills | Status: DC

## 2020-04-23 ENCOUNTER — Other Ambulatory Visit: Payer: Self-pay | Admitting: Family Medicine

## 2020-04-28 ENCOUNTER — Other Ambulatory Visit: Payer: Self-pay

## 2020-04-28 MED ORDER — BUTALBITAL-APAP-CAFFEINE 50-325-40 MG PO TABS: 1.0000 | ORAL_TABLET | Freq: Four times a day (QID) | ORAL | 2 refills | Status: DC | PRN

## 2020-05-12 ENCOUNTER — Other Ambulatory Visit: Payer: Self-pay | Admitting: Physician Assistant

## 2020-05-21 NOTE — Progress Notes (Signed)
Subjective:  Patient ID: Anna Hayes, female    DOB: 07-13-1951  Age: 69 y.o. MRN: [id number]  Chief Complaint  Patient presents with  . Hypertension  . Hyperlipidemia  . Hypothyroidism    HPIPatient presents with postprocedural hypothyroidism.  Date of diagnosis 25.  She is currently taking Synthroid, 25 mcg daily.  TSH was last checked May/2021.  The result was reported as normal (2.71 mU/L ).      Additionally, she presents with history of migraines. Has 2-3 times per week.  When she has them they have a throbbing, pounding HA with nausea and photophobia.  She has no obvious triggers for her headaches.  The pain improves with tylenol, Bupap and Phenergan prn.  She is currently having a headache.  Pt presents for follow up of hypertension.  Date of diagnosis 2003.  Her current cardiac medication regimen includes a diuretic ( Lasix 20 mg QD prn ), a beta-blocker ( Atenolol 50 mg QD ), and an ACE inhibitor ( Lisinopril 20mg  daily ).  She is tolerating the medication well and compliant with treatment taking her medication as directed. She eats healthy. Exercise is limited due to her left AKA.    GERD is well controlled with omeprazole.    [name] presents with a diagnosis of hemiplegia and hemiparesis following cerebral infarction affecting left non-dominant side.  She is able to move her amputated leg. Her left arm is affected by her stroke and she has minimal use of her hand.This was diagnosed 2003.  The course has been stable. She takes simvastatin and xarelto.      Pt presents with hyperlipidemia.  Date of diagnosis 2003.  Current treatment includes Zocor.  Compliance is good. She takes her medicines regularly and maintains her low cholesterol diet.  She follows up as directed, and maintains her exercise from her wheelchair as much as she can.   Patient has PAD of BL extremities. She is on zocor and xarelto. She has a left AKA (06/2017.)  She has not quit smoking but has decreased  to 1 ppd. She is trying to cut down herself. Patient being followed by [facility].    She reports a history of B12 deficiency.  She has been off B12 for quite some time but would like to restart her B12 injections.  She says they make her feel significantly better.  Social Hx   Social History   Socioeconomic History  . Marital status: Widowed    Spouse name: Not on file  . Number of children: Not on file  . Years of education: Not on file  . Highest education level: Not on file  Occupational History  . Not on file  Tobacco Use  . Smoking status: Current Some Day Smoker    Packs/day: 0.50    Types: Cigarettes  . Smokeless tobacco: Never Used  Vaping Use  . Vaping Use: Never used  Substance and Sexual Activity  . Alcohol use: No  . Drug use: No  . Sexual activity: Not on file  Other Topics Concern  . Not on file  Social History Narrative  . Not on file   Social Determinants of Health   Financial Resource Strain:   . Difficulty of Paying Living Expenses: Not on file  Food Insecurity:   . Worried About Charity fundraiser in the Last Year: Not on file  . Ran Out of Food in the Last Year: Not on file  Transportation Needs:   .  Lack of Transportation (Medical): Not on file  . Lack of Transportation (Non-Medical): Not on file  Physical Activity:   . Days of Exercise per Week: Not on file  . Minutes of Exercise per Session: Not on file  Stress:   . Feeling of Stress : Not on file  Social Connections:   . Frequency of Communication with Friends and Family: Not on file  . Frequency of Social Gatherings with Friends and Family: Not on file  . Attends Religious Services: Not on file  . Active Member of Clubs or Organizations: Not on file  . Attends Archivist Meetings: Not on file  . Marital Status: Not on file   Past Medical History:  Diagnosis Date  . Anemia   . Constipation   . DVT of lower extremity (deep venous thrombosis) ([place])   .  Frequent headaches   . Heart murmur    found during pregnancy -  no problems  . Hypertension   . Muscle pain   . Palpitations   . Septic thrombophlebitis 08/31/2017  . Sinus problem   . Stroke [facility])     Review of Systems  Constitutional: Negative for chills, fatigue and fever.  HENT: Negative for congestion, ear pain, rhinorrhea, sinus pain and sore throat.   Respiratory: Negative for cough and shortness of breath.   Cardiovascular: Negative for chest pain.  Gastrointestinal: Positive for nausea (Promethazine helps.). Negative for abdominal pain, constipation, diarrhea and vomiting.  Endocrine: Positive for polyuria. Negative for polydipsia and polyphagia.  Genitourinary: Negative for dysuria and urgency.  Musculoskeletal: Negative for arthralgias, back pain and myalgias.  Neurological: Positive for headaches. Negative for dizziness, weakness and light-headedness.  Psychiatric/Behavioral: Negative for dysphoric mood. The patient is not nervous/anxious.      Objective:  BP 136/68   Pulse 72   Temp 97.6 F (36.4 C)   Resp 14   Ht 5\' 2"  (1.575 m)   BMI 28.98 kg/m   BP/Weight 05/22/2020 02/19/2020 5/63/1497  Systolic BP 026 378 588  Diastolic BP 68 56 71  Wt. (Lbs) - - -  BMI 28.98 - -    Physical Exam Vitals reviewed.  Constitutional:      General: She is not in acute distress.    Appearance: Normal appearance. She is normal weight.     Comments: Overweight.  Neck:     Thyroid: No thyroid mass.     Vascular: No carotid bruit.  Cardiovascular:     Rate and Rhythm: Normal rate and regular rhythm.     Pulses: Normal pulses.     Heart sounds: Normal heart sounds. No murmur heard.   Pulmonary:     Effort: Pulmonary effort is normal. No respiratory distress.     Breath sounds: Normal breath sounds.  Abdominal:     General: Abdomen is flat. Bowel sounds are normal.     Palpations: Abdomen is soft. There is no mass.     Tenderness: There is no abdominal tenderness.    Musculoskeletal:        General: Deformity present.     Comments: Left AKA. Left arm had decreased function. Left hand has contracture.   Skin:    General: Skin is warm and dry.  Neurological:     Mental Status: She is alert and oriented to person, place, and time.     Cranial Nerves: No cranial nerve deficit.  Psychiatric:        Mood and Affect: Mood normal.  Behavior: Behavior normal.     Lab Results  Component Value Date   WBC 8.2 05/22/2020   HGB 11.0 (L) 05/22/2020   HCT 35.5 05/22/2020   PLT 420 05/22/2020   GLUCOSE 101 (H) 05/22/2020   CHOL 156 05/22/2020   TRIG 180 (H) 05/22/2020   HDL 44 05/22/2020   LDLCALC 81 05/22/2020   ALT 19 05/22/2020   AST 22 05/22/2020   NA 136 05/22/2020   K 4.6 05/22/2020   CL 103 05/22/2020   CREATININE 1.04 (H) 05/22/2020   BUN 14 05/22/2020   CO2 21 05/22/2020   TSH 2.710 02/19/2020   INR 1.21 06/12/2018      Assessment & Plan:  1. Benign essential HTN Well controlled.  No changes to medicines.  Continue to work on eating a healthy diet and exercise.  Labs drawn today.  - CBC with Differential/Platelet - Comprehensive metabolic panel  2. Mixed hyperlipidemia Well controlled.  No changes to medicines.  Continue to work on eating a healthy diet and exercise.  Labs drawn today.  - Lipid panel  3. Secondary hypothyroidism The current medical regimen is effective;  continue present plan and medications.  4. Spastic hemiparesis of left nondominant side (HCC) Compensates well.  5. B12 deficiency - Vitamin B12 - cyanocobalamin (,VITAMIN B-12,) 1000 MCG/ML injection; Inject 1 mL (1,000 mcg total) into the muscle every 30 (thirty) days.  Dispense: 1 mL; Refill: 5  6. Migraine with aura and without status migrainosus, not intractable - ketorolac (TORADOL) injection 60 mg  7. Amputation of left lower extremity above knee upon examination ([place]) Compensates well. . 8.  Other iron deficiency. -Iron studies added  on. Meds ordered this encounter  Medications  . atenolol (TENORMIN) 50 MG tablet    Sig: Take 1 tablet (50 mg total) by mouth daily.    Dispense:  90 tablet    Refill:  1  . cyanocobalamin (,VITAMIN B-12,) 1000 MCG/ML injection    Sig: Inject 1 mL (1,000 mcg total) into the muscle every 30 (thirty) days.    Dispense:  1 mL    Refill:  5  . ketorolac (TORADOL) injection 60 mg   Follow up in 3 months. Educated on reasons to quit smoking!  Rochel Brome [facility] [phone]

## 2020-05-22 ENCOUNTER — Encounter: Payer: Self-pay | Admitting: Family Medicine

## 2020-05-22 ENCOUNTER — Ambulatory Visit (INDEPENDENT_AMBULATORY_CARE_PROVIDER_SITE_OTHER): Payer: Medicare Other | Admitting: Family Medicine

## 2020-05-22 ENCOUNTER — Other Ambulatory Visit: Payer: Self-pay

## 2020-05-22 VITALS — BP 136/68 | HR 72 | Temp 97.6°F | Resp 14 | Ht 62.0 in

## 2020-05-22 DIAGNOSIS — E538 Deficiency of other specified B group vitamins: Secondary | ICD-10-CM | POA: Diagnosis not present

## 2020-05-22 DIAGNOSIS — G43109 Migraine with aura, not intractable, without status migrainosus: Secondary | ICD-10-CM | POA: Diagnosis not present

## 2020-05-22 DIAGNOSIS — E038 Other specified hypothyroidism: Secondary | ICD-10-CM

## 2020-05-22 DIAGNOSIS — G8114 Spastic hemiplegia affecting left nondominant side: Secondary | ICD-10-CM | POA: Diagnosis not present

## 2020-05-22 DIAGNOSIS — D649 Anemia, unspecified: Secondary | ICD-10-CM | POA: Diagnosis not present

## 2020-05-22 DIAGNOSIS — I10 Essential (primary) hypertension: Secondary | ICD-10-CM

## 2020-05-22 DIAGNOSIS — D508 Other iron deficiency anemias: Secondary | ICD-10-CM

## 2020-05-22 DIAGNOSIS — E782 Mixed hyperlipidemia: Secondary | ICD-10-CM | POA: Diagnosis not present

## 2020-05-22 DIAGNOSIS — S78112A Complete traumatic amputation at level between left hip and knee, initial encounter: Secondary | ICD-10-CM

## 2020-05-22 DIAGNOSIS — R7301 Impaired fasting glucose: Secondary | ICD-10-CM | POA: Diagnosis not present

## 2020-05-22 MED ORDER — KETOROLAC TROMETHAMINE 60 MG/2ML IM SOLN
60.0000 mg | Freq: Once | INTRAMUSCULAR | Status: AC
  Administered 2020-05-22: 60 mg via INTRAMUSCULAR

## 2020-05-22 MED ORDER — ATENOLOL 50 MG PO TABS: 50.0000 mg | ORAL_TABLET | Freq: Every day | ORAL | 1 refills | Status: DC

## 2020-05-22 MED ORDER — CYANOCOBALAMIN 1000 MCG/ML IJ SOLN: 1000.0000 ug | INTRAMUSCULAR | 5 refills | Status: DC

## 2020-05-23 LAB — COMPREHENSIVE METABOLIC PANEL
ALT: 19 IU/L (ref 0–32)
AST: 22 IU/L (ref 0–40)
Albumin/Globulin Ratio: 1.4 (ref 1.2–2.2)
Albumin: 4.2 g/dL (ref 3.7–4.7)
Alkaline Phosphatase: 88 IU/L (ref 48–121)
BUN/Creatinine Ratio: 13 (ref 12–28)
BUN: 14 mg/dL (ref 8–27)
Bilirubin Total: 0.2 mg/dL (ref 0.0–1.2)
CO2: 21 mmol/L (ref 20–29)
Calcium: 9.2 mg/dL (ref 8.7–10.3)
Chloride: 103 mmol/L (ref 96–106)
Creatinine, Ser: 1.04 mg/dL — ABNORMAL HIGH (ref 0.57–1.00)
GFR calc Af Amer: 62 mL/min/{1.73_m2} (ref >59)
GFR calc non Af Amer: 54 mL/min/{1.73_m2} — ABNORMAL LOW (ref >59)
Globulin, Total: 3 g/dL (ref 1.5–4.5)
Glucose: 101 mg/dL — ABNORMAL HIGH (ref 65–99)
Potassium: 4.6 mmol/L (ref 3.5–5.2)
Sodium: 136 mmol/L (ref 134–144)
Total Protein: 7.2 g/dL (ref 6.0–8.5)

## 2020-05-23 LAB — CBC WITH DIFFERENTIAL/PLATELET
Basophils Absolute: 0.1 10*3/uL (ref 0.0–0.2)
Basos: 1 %
EOS (ABSOLUTE): 0.4 10*3/uL (ref 0.0–0.4)
Eos: 5 %
Hematocrit: 35.5 % (ref 34.0–46.6)
Hemoglobin: 11 g/dL — ABNORMAL LOW (ref 11.1–15.9)
Immature Grans (Abs): 0 10*3/uL (ref 0.0–0.1)
Immature Granulocytes: 0 %
Lymphocytes Absolute: 2.6 10*3/uL (ref 0.7–3.1)
Lymphs: 31 %
MCH: 24.6 pg — ABNORMAL LOW (ref 26.6–33.0)
MCHC: 31 g/dL — ABNORMAL LOW (ref 31.5–35.7)
MCV: 79 fL (ref 79–97)
Monocytes Absolute: 0.9 10*3/uL (ref 0.1–0.9)
Monocytes: 11 %
Neutrophils Absolute: 4.3 10*3/uL (ref 1.4–7.0)
Neutrophils: 52 %
Platelets: 420 10*3/uL (ref 150–450)
RBC: 4.48 x10E6/uL (ref 3.77–5.28)
RDW: 16.1 % — ABNORMAL HIGH (ref 11.7–15.4)
WBC: 8.2 10*3/uL (ref 3.4–10.8)

## 2020-05-23 LAB — LIPID PANEL
Chol/HDL Ratio: 3.5 ratio (ref 0.0–4.4)
Cholesterol, Total: 156 mg/dL (ref 100–199)
HDL: 44 mg/dL (ref >39)
LDL Chol Calc (NIH): 81 mg/dL (ref 0–99)
Triglycerides: 180 mg/dL — ABNORMAL HIGH (ref 0–149)
VLDL Cholesterol Cal: 31 mg/dL (ref 5–40)

## 2020-05-23 LAB — VITAMIN B12: Vitamin B-12: 721 pg/mL (ref 232–1245)

## 2020-05-23 LAB — CARDIOVASCULAR RISK ASSESSMENT

## 2020-05-30 LAB — FERRITIN: Ferritin: 25 ng/mL (ref 15–150)

## 2020-05-30 LAB — IRON AND TIBC
Iron Saturation: 19 % (ref 15–55)
Iron: 75 ug/dL (ref 27–139)
Total Iron Binding Capacity: 389 ug/dL (ref 250–450)
UIBC: 314 ug/dL (ref 118–369)

## 2020-06-10 ENCOUNTER — Other Ambulatory Visit: Payer: Self-pay | Admitting: Family Medicine

## 2020-06-18 ENCOUNTER — Ambulatory Visit (INDEPENDENT_AMBULATORY_CARE_PROVIDER_SITE_OTHER): Payer: Medicare Other

## 2020-06-18 DIAGNOSIS — E538 Deficiency of other specified B group vitamins: Secondary | ICD-10-CM | POA: Diagnosis not present

## 2020-06-18 DIAGNOSIS — Z23 Encounter for immunization: Secondary | ICD-10-CM

## 2020-06-18 MED ORDER — CYANOCOBALAMIN 1000 MCG/ML IJ SOLN
1000.0000 ug | Freq: Once | INTRAMUSCULAR | Status: AC
  Administered 2020-06-18: 1000 ug via INTRAMUSCULAR

## 2020-07-06 ENCOUNTER — Other Ambulatory Visit: Payer: Self-pay

## 2020-07-06 MED ORDER — PROMETHAZINE HCL 25 MG PO TABS: 25.0000 mg | ORAL_TABLET | Freq: Three times a day (TID) | ORAL | 0 refills | Status: DC | PRN

## 2020-07-16 ENCOUNTER — Other Ambulatory Visit: Payer: Self-pay | Admitting: Family Medicine

## 2020-07-16 DIAGNOSIS — I10 Essential (primary) hypertension: Secondary | ICD-10-CM

## 2020-07-21 ENCOUNTER — Encounter: Payer: Self-pay | Admitting: Family Medicine

## 2020-07-21 ENCOUNTER — Other Ambulatory Visit: Payer: Self-pay

## 2020-07-21 ENCOUNTER — Ambulatory Visit (INDEPENDENT_AMBULATORY_CARE_PROVIDER_SITE_OTHER): Payer: Medicare Other | Admitting: Family Medicine

## 2020-07-21 VITALS — BP 124/62 | HR 70 | Temp 97.1°F | Resp 14 | Ht 62.0 in

## 2020-07-21 DIAGNOSIS — F17209 Nicotine dependence, unspecified, with unspecified nicotine-induced disorders: Secondary | ICD-10-CM

## 2020-07-21 DIAGNOSIS — M816 Localized osteoporosis [Lequesne]: Secondary | ICD-10-CM | POA: Diagnosis not present

## 2020-07-21 DIAGNOSIS — Z1231 Encounter for screening mammogram for malignant neoplasm of breast: Secondary | ICD-10-CM | POA: Diagnosis not present

## 2020-07-21 DIAGNOSIS — Z0000 Encounter for general adult medical examination without abnormal findings: Secondary | ICD-10-CM | POA: Diagnosis not present

## 2020-07-21 DIAGNOSIS — E538 Deficiency of other specified B group vitamins: Secondary | ICD-10-CM

## 2020-07-21 MED ORDER — CYANOCOBALAMIN 1000 MCG/ML IJ SOLN
1000.0000 ug | Freq: Once | INTRAMUSCULAR | Status: AC
  Administered 2020-07-21: 1000 ug via INTRAMUSCULAR

## 2020-07-21 NOTE — Patient Instructions (Addendum)
Ms. Spillman , Thank you for taking time to come for your Medicare Wellness Visit. I appreciate your ongoing commitment to your health goals. Please review the following plan we discussed and let me know if I can assist you in the future.   These are the goals we discussed: Goals    . Quit Smoking      Osteoporosis: Recommend alendronate (fosamax) 70 mg once weekly. Call me and let me know.      Recommend calcium with vitamin d 1200 mg daily. Go to pharmacy: Recommend shingrix vaccine (2 doses) Recommend TDAP vaccine   This is a list of the screening recommended for you and due dates:  Health Maintenance  Topic Date Due  . Tetanus Vaccine  Never done  . Pneumonia vaccines (2 of 2 - PPSV23) 07/31/2017  . Mammogram  10/24/2021  . Colon Cancer Screening  11/25/2025  . Flu Shot  Completed  . DEXA scan (bone density measurement)  Completed  . COVID-19 Vaccine  Completed  .  Hepatitis C: One time screening is recommended by Center for Disease Control  (CDC) for  adults born from [birth year] through 1967.   Completed    Preventive Care 19 Years and Older, Female Preventive care refers to lifestyle choices and visits with your health care provider that can promote health and wellness. This includes:  A yearly physical exam. This is also called an annual well check.  Regular dental and eye exams.  Immunizations.  Screening for certain conditions.  Healthy lifestyle choices, such as diet and exercise. What can I expect for my preventive care visit? Physical exam Your health care provider will check:  Height and weight. These may be used to calculate body mass index (BMI), which is a measurement that tells if you are at a healthy weight.  Heart rate and blood pressure.  Your skin for abnormal spots. Counseling Your health care provider may ask you questions about:  Alcohol, tobacco, and drug use.  Emotional well-being.  Home and relationship well-being.  Sexual  activity.  Eating habits.  History of falls.  Memory and ability to understand (cognition).  Work and work Statistician.  Pregnancy and menstrual history. What immunizations do I need?  Influenza (flu) vaccine  This is recommended every year. Tetanus, diphtheria, and pertussis (Tdap) vaccine  You may need a Td booster every 10 years. Varicella (chickenpox) vaccine  You may need this vaccine if you have not already been vaccinated. Zoster (shingles) vaccine  You may need this after age 85. Pneumococcal conjugate (PCV13) vaccine  One dose is recommended after age 71. Pneumococcal polysaccharide (PPSV23) vaccine  One dose is recommended after age 12. Measles, mumps, and rubella (MMR) vaccine  You may need at least one dose of MMR if you were born in 1957 or later. You may also need a second dose. Meningococcal conjugate (MenACWY) vaccine  You may need this if you have certain conditions. Hepatitis A vaccine  You may need this if you have certain conditions or if you travel or work in places where you may be exposed to hepatitis A. Hepatitis B vaccine  You may need this if you have certain conditions or if you travel or work in places where you may be exposed to hepatitis B. Haemophilus influenzae type b (Hib) vaccine  You may need this if you have certain conditions. You may receive vaccines as individual doses or as more than one vaccine together in one shot (combination vaccines). Talk with your health care  provider about the risks and benefits of combination vaccines. What tests do I need? Blood tests  Lipid and cholesterol levels. These may be checked every 5 years, or more frequently depending on your overall health.  Hepatitis C test.  Hepatitis B test. Screening  Lung cancer screening. You may have this screening every year starting at age 51 if you have a 30-pack-year history of smoking and currently smoke or have quit within the past 15  years.  Colorectal cancer screening. All adults should have this screening starting at age 87 and continuing until age 21. Your health care provider may recommend screening at age 64 if you are at increased risk. You will have tests every 1-10 years, depending on your results and the type of screening test.  Diabetes screening. This is done by checking your blood sugar (glucose) after you have not eaten for a while (fasting). You may have this done every 1-3 years.  Mammogram. This may be done every 1-2 years. Talk with your health care provider about how often you should have regular mammograms.  BRCA-related cancer screening. This may be done if you have a family history of breast, ovarian, tubal, or peritoneal cancers. Other tests  Sexually transmitted disease (STD) testing.  Bone density scan. This is done to screen for osteoporosis. You may have this done starting at age 61. Follow these instructions at home: Eating and drinking  Eat a diet that includes fresh fruits and vegetables, whole grains, lean protein, and low-fat dairy products. Limit your intake of foods with high amounts of sugar, saturated fats, and salt.  Take vitamin and mineral supplements as recommended by your health care provider.  Do not drink alcohol if your health care provider tells you not to drink.  If you drink alcohol: ? Limit how much you have to 0-1 drink a day. ? Be aware of how much alcohol is in your drink. In the [place] one drink equals one 12 oz bottle of beer (355 mL), one 5 oz glass of wine (148 mL), or one 1 oz glass of hard liquor (44 mL). Lifestyle  Take daily care of your teeth and gums.  Stay active. Exercise for at least 30 minutes on 5 or more days each week.  Do not use any products that contain nicotine or tobacco, such as cigarettes, e-cigarettes, and chewing tobacco. If you need help quitting, ask your health care provider.  If you are sexually active, practice safe sex. Use a  condom or other form of protection in order to prevent STIs (sexually transmitted infections).  Talk with your health care provider about taking a low-dose aspirin or statin. What's next?  Go to your health care provider once a year for a well check visit.  Ask your health care provider how often you should have your eyes and teeth checked.  Stay up to date on all vaccines. This information is not intended to replace advice given to you by your health care provider. Make sure you discuss any questions you have with your health care provider. Document Revised: 09/06/2018 Document Reviewed: 09/06/2018 Elsevier Patient Education  [place].    Osteoporosis  Osteoporosis happens when your bones get thin and weak. This can cause your bones to break (fracture) more easily. You can do things at home to make your bones stronger. Follow these instructions at home:  Activity  Exercise as told by your doctor. Ask your doctor what activities are safe for you. You should do: ? Exercises  that make your muscles work to hold your body weight up (weight-bearing exercises). These include tai chi, yoga, and walking. ? Exercises to make your muscles stronger. One example is lifting weights. Lifestyle  Limit alcohol intake to no more than 1 drink a day for nonpregnant women and 2 drinks a day for men. One drink equals 12 oz of beer, 5 oz of wine, or 1 oz of hard liquor.  Do not use any products that have nicotine or tobacco in them. These include cigarettes and e-cigarettes. If you need help quitting, ask your doctor. Preventing falls  Use tools to help you move around (mobility aids) as needed. These include canes, walkers, scooters, and crutches.  Keep rooms well-lit and free of clutter.  Put away things that could make you trip. These include cords and rugs.  Install safety rails on stairs. Install grab bars in bathrooms.  Use rubber mats in slippery areas, like bathrooms.  Wear  shoes that: ? Fit you well. ? Support your feet. ? Have closed toes. ? Have rubber soles or low heels.  Tell your doctor about all of the medicines you are taking. Some medicines can make you more likely to fall. General instructions  Eat plenty of calcium and vitamin D. These nutrients are good for your bones. Good sources of calcium and vitamin D include: ? Some fatty fish, such as salmon and tuna. ? Foods that have calcium and vitamin D added to them (fortified foods). For example, some breakfast cereals are fortified with calcium and vitamin D. ? Egg yolks. ? Cheese. ? Liver.  Take over-the-counter and prescription medicines only as told by your doctor.  Keep all follow-up visits as told by your doctor. This is important. Contact a doctor if:  You have not been tested (screened) for osteoporosis and you are: ? A woman who is age 54 or older. ? A man who is age 21 or older. Get help right away if:  You fall.  You get hurt. Summary  Osteoporosis happens when your bones get thin and weak.  Weak bones can break (fracture) more easily.  Eat plenty of calcium and vitamin D. These nutrients are good for your bones.  Tell your doctor about all of the medicines that you take. This information is not intended to replace advice given to you by your health care provider. Make sure you discuss any questions you have with your health care provider. Document Revised: 08/25/2017 Document Reviewed: 07/07/2017 Elsevier Patient Education  2020 [employer].

## 2020-07-21 NOTE — Progress Notes (Signed)
Subjective:  Patient ID: Anna Hayes, female    DOB: 1951-08-07  Age: 69 y.o. MRN: [id number]  Chief Complaint  Patient presents with  . Annual Exam    HPI Encounter for general adult medical examination without abnormal findings  Physical ("At Risk" items are starred): Patient's last physical exam was 1 year ago .  Smoking: Life-long smoker  Physical Activity: Exercises at least 3 times per week ;  Alcohol/Drug Use: Is a non-drinker ; No illicit drug use ;  Patient is not afflicted from Stress Incontinence and Urge Incontinence  Safety: reviewed ; Patient wears a seat belt, has smoke detectors, has carbon monoxide detectors, practices appropriate gun safety, and wears sunscreen with extended sun exposure. Dental Care: wears dentures, brushes daily  Ophthalmology/Optometry: Annual visit.  Hearing loss: none Vision impairments: wears glasses Bone density: osteoporosis Mamm: normal.    Fall Risk  07/21/2020 02/19/2020 12/27/2017 10/30/2017 10/02/2017  Falls in the past year? 0 0 No No No  Number falls in past yr: 0 0 - - -  Injury with Fall? 0 0 - - -  Risk for fall due to : Impaired mobility;No Fall Risks - - - -  Follow up Falls evaluation completed;Falls prevention discussed - - - -     Depression screen [facility] 2/9 07/21/2020 12/01/2019 10/02/2017 08/31/2017 08/29/2017  Decreased Interest 0 0 0 0 0  Down, Depressed, Hopeless 0 0 0 0 0  PHQ - 2 Score 0 0 0 0 0  Altered sleeping - - - - 0  Tired, decreased energy - - - - 0  Change in appetite - - - - 1  Feeling bad or failure about yourself  - - - - 1  Trouble concentrating - - - - 0  Moving slowly or fidgety/restless - - - - 0  Suicidal thoughts - - - - 0  PHQ-9 Score - - - - 2  Difficult doing work/chores - - - - Not difficult at all       Functional Status Survey: Is the patient deaf or have difficulty hearing?: No Does the patient have difficulty seeing, even when wearing glasses/contacts?: No Does the patient have  difficulty concentrating, remembering, or making decisions?: No Does the patient have difficulty walking or climbing stairs?: Yes Does the patient have difficulty dressing or bathing?: No Does the patient have difficulty doing errands alone such as visiting a doctor's office or shopping?: No   Social Hx   Social History   Socioeconomic History  . Marital status: Widowed    Spouse name: Not on file  . Number of children: Not on file  . Years of education: Not on file  . Highest education level: Not on file  Occupational History  . Not on file  Tobacco Use  . Smoking status: Current Some Day Smoker    Packs/day: 0.50    Years: 53.00    Pack years: 26.50    Types: Cigarettes  . Smokeless tobacco: Never Used  Vaping Use  . Vaping Use: Never used  Substance and Sexual Activity  . Alcohol use: No  . Drug use: No  . Sexual activity: Not Currently  Other Topics Concern  . Not on file  Social History Narrative  . Not on file   Social Determinants of Health   Financial Resource Strain:   . Difficulty of Paying Living Expenses: Not on file  Food Insecurity:   . Worried About Charity fundraiser in the Last Year:  Not on file  . Ran Out of Food in the Last Year: Not on file  Transportation Needs:   . Lack of Transportation (Medical): Not on file  . Lack of Transportation (Non-Medical): Not on file  Physical Activity:   . Days of Exercise per Week: Not on file  . Minutes of Exercise per Session: Not on file  Stress:   . Feeling of Stress : Not on file  Social Connections:   . Frequency of Communication with Friends and Family: Not on file  . Frequency of Social Gatherings with Friends and Family: Not on file  . Attends Religious Services: Not on file  . Active Member of Clubs or Organizations: Not on file  . Attends Archivist Meetings: Not on file  . Marital Status: Not on file   Past Medical History:  Diagnosis Date  . Anemia   . Constipation   . DVT of  lower extremity (deep venous thrombosis) ([place])   . Frequent headaches   . Heart murmur    found during pregnancy -  no problems  . Hypertension   . Muscle pain   . Palpitations   . Septic thrombophlebitis 08/31/2017  . Sinus problem   . Stroke [facility])    Family History  Problem Relation Age of Onset  . Breast cancer Mother   . Prostate cancer Father   . Prostate cancer Brother     Review of Systems  Constitutional: Negative for chills, fatigue and fever.  HENT: Positive for sinus pressure. Negative for congestion, rhinorrhea and sore throat.   Respiratory: Negative for cough and shortness of breath.   Cardiovascular: Negative for chest pain and palpitations.  Gastrointestinal: Negative for abdominal pain, constipation, diarrhea, nausea and vomiting.  Genitourinary: Negative for dysuria and urgency.  Musculoskeletal: Negative for back pain and myalgias.  Neurological: Positive for headaches. Negative for dizziness, weakness and light-headedness.  Psychiatric/Behavioral: Negative for dysphoric mood. The patient is not nervous/anxious.       Objective:  BP 124/62   Pulse 70   Temp (!) 97.1 F (36.2 C)   Resp 14   Ht 5\' 2"  (1.575 m)   BMI 28.98 kg/m   BP/Weight 07/21/2020 05/22/2020 05/27/5175  Systolic BP 160 737 106  Diastolic BP 62 68 56  Wt. (Lbs) - - -  BMI 28.98 28.98 -    Physical Exam Constitutional:      Appearance: Normal appearance. She is normal weight.  Musculoskeletal:     Comments: Left AKA.   Neurological:     Mental Status: She is alert and oriented to person, place, and time.  Psychiatric:        Mood and Affect: Mood normal.        Behavior: Behavior normal.     Lab Results  Component Value Date   WBC 8.2 05/22/2020   HGB 11.0 (L) 05/22/2020   HCT 35.5 05/22/2020   PLT 420 05/22/2020   GLUCOSE 101 (H) 05/22/2020   CHOL 156 05/22/2020   TRIG 180 (H) 05/22/2020   HDL 44 05/22/2020   LDLCALC 81 05/22/2020   ALT 19 05/22/2020   AST 22  05/22/2020   NA 136 05/22/2020   K 4.6 05/22/2020   CL 103 05/22/2020   CREATININE 1.04 (H) 05/22/2020   BUN 14 05/22/2020   CO2 21 05/22/2020   TSH 2.710 02/19/2020   INR 1.21 06/12/2018      Assessment & Plan:  1. General medical exam These are the goals  we discussed: Goals    . Quit Smoking      Osteoporosis: Recommend alendronate (fosamax) 70 mg once weekly.  Patient to call me and let me know.  Recommend calcium with vitamin d 1200 mg daily.  Go to pharmacy: Recommend shingrix vaccine (2 doses) Recommend TDAP vaccine   2. B12 deficiency - cyanocobalamin ((VITAMIN B-12)) injection 1,000 mcg  3. Localized osteoporosis without current pathological fracture Recommend start on calcium with vitamin d 1200 mg daily.  Recommended fosamax, but pt prefers to wait and think about it.   4. Tobacco use disorder, continuous  Patient is working on quitting smoking. She is gradually decreasing.  Prefers no education or medicines to help.  5. Breast cancer screening.   Order mammogram  Meds ordered this encounter  Medications  . cyanocobalamin ((VITAMIN B-12)) injection 1,000 mcg    These are the goals we discussed: Goals    . Quit Smoking        This is a list of the screening recommended for you and due dates:  Health Maintenance  Topic Date Due  . Tetanus Vaccine  Never done  . Pneumonia vaccines (2 of 2 - PPSV23) 07/31/2017  . Mammogram  10/24/2021  . Colon Cancer Screening  11/25/2025  . Flu Shot  Completed  . DEXA scan (bone density measurement)  Completed  . COVID-19 Vaccine  Completed  .  Hepatitis C: One time screening is recommended by Center for Disease Control  (CDC) for  adults born from [birth year] through 1967.   Completed     AN INDIVIDUALIZED CARE PLAN: was established or reinforced today.   SELF MANAGEMENT: The patient and I together assessed ways to personally work towards obtaining the recommended goals  Support needs The patient and/or family needs  were assessed and services were offered and not necessary at this time.    Follow-up: No follow-ups on file. Rochel Brome [facility] [phone]

## 2020-07-23 ENCOUNTER — Other Ambulatory Visit: Payer: Self-pay | Admitting: Family Medicine

## 2020-07-29 ENCOUNTER — Other Ambulatory Visit: Payer: Self-pay

## 2020-07-29 MED ORDER — BUTALBITAL-APAP-CAFFEINE 50-325-40 MG PO TABS: 1.0000 | ORAL_TABLET | Freq: Four times a day (QID) | ORAL | 2 refills | Status: DC | PRN

## 2020-08-11 ENCOUNTER — Other Ambulatory Visit: Payer: Self-pay

## 2020-08-11 DIAGNOSIS — E038 Other specified hypothyroidism: Secondary | ICD-10-CM

## 2020-08-11 MED ORDER — LEVOTHYROXINE SODIUM 25 MCG PO TABS: 25.0000 ug | ORAL_TABLET | Freq: Every day | ORAL | 0 refills | Status: DC

## 2020-08-19 ENCOUNTER — Ambulatory Visit (INDEPENDENT_AMBULATORY_CARE_PROVIDER_SITE_OTHER): Payer: Medicare Other | Admitting: Family Medicine

## 2020-08-19 ENCOUNTER — Other Ambulatory Visit: Payer: Self-pay

## 2020-08-19 ENCOUNTER — Encounter: Payer: Self-pay | Admitting: Family Medicine

## 2020-08-19 VITALS — BP 136/68 | HR 71 | Temp 97.4°F | Ht 62.0 in | Wt 176.0 lb

## 2020-08-19 DIAGNOSIS — S78112A Complete traumatic amputation at level between left hip and knee, initial encounter: Secondary | ICD-10-CM

## 2020-08-19 DIAGNOSIS — E538 Deficiency of other specified B group vitamins: Secondary | ICD-10-CM

## 2020-08-19 DIAGNOSIS — E782 Mixed hyperlipidemia: Secondary | ICD-10-CM | POA: Diagnosis not present

## 2020-08-19 DIAGNOSIS — G43011 Migraine without aura, intractable, with status migrainosus: Secondary | ICD-10-CM

## 2020-08-19 DIAGNOSIS — E038 Other specified hypothyroidism: Secondary | ICD-10-CM | POA: Diagnosis not present

## 2020-08-19 DIAGNOSIS — I10 Essential (primary) hypertension: Secondary | ICD-10-CM | POA: Diagnosis not present

## 2020-08-19 DIAGNOSIS — I739 Peripheral vascular disease, unspecified: Secondary | ICD-10-CM

## 2020-08-19 MED ORDER — ATENOLOL 50 MG PO TABS: 50.0000 mg | ORAL_TABLET | Freq: Every day | ORAL | 1 refills | Status: DC

## 2020-08-19 MED ORDER — KETOROLAC TROMETHAMINE 60 MG/2ML IM SOLN
60.0000 mg | Freq: Once | INTRAMUSCULAR | Status: AC
  Administered 2020-08-19: 60 mg via INTRAMUSCULAR

## 2020-08-19 MED ORDER — CYANOCOBALAMIN 1000 MCG/ML IJ SOLN: 1000.0000 ug | Freq: Once | INTRAMUSCULAR | Status: DC

## 2020-08-19 NOTE — Progress Notes (Signed)
Subjective:  Patient ID: Anna Hayes, female    DOB: 1951/01/01  Age: 69 y.o. MRN: [id number]  Chief Complaint  Patient presents with  . Gastroesophageal Reflux  . Hyperlipidemia  . Hypertension    HPI Woke up with a headache. Woke up the past 2-3 days with a headache however tylenol has been helping. She is requesting a toradol shot.   Pt presents for follow up of hypertension.  Current meds Lasix, Atenolol, Lisinopril. She eats healthy. Exercise is limited due to her left AKA.    GERD is well controlled with omeprazole.    Hyperlipidemia- Current treatment includes Zocor. Maintains her low cholesterol diet.  She follows up as directed, and maintains her exercise from her wheelchair as much as she can.   History of stroke. Weakness unilaterally. Hx of DVT. On xarelto.   B12 deficiency. On b12 shots monthly.   PVD - Left AKA. On xarelto.  Current Outpatient Medications on File Prior to Visit  Medication Sig Dispense Refill  . butalbital-acetaminophen-caffeine (FIORICET) 50-325-40 MG tablet Take 1 tablet by mouth every 6 (six) hours as needed for migraine. 30 tablet 2  . cyanocobalamin (,VITAMIN B-12,) 1000 MCG/ML injection Inject 1 mL (1,000 mcg total) into the muscle every 30 (thirty) days. 1 mL 5  . furosemide (LASIX) 20 MG tablet Take 20 mg by mouth daily as needed for fluid.     Marland Kitchen levothyroxine (SYNTHROID) 25 MCG tablet Take 1 tablet (25 mcg total) by mouth daily. 90 tablet 0  . LINZESS 145 MCG CAPS capsule TAKE 1 CAPSULE (145 MCG TOTAL) BY MOUTH DAILY BEFORE BREAKFAST. 90 capsule 1  . lisinopril (ZESTRIL) 20 MG tablet TAKE 1 TABLET BY MOUTH EVERY DAY AS DIRECTED 90 tablet 1  . omeprazole (PRILOSEC) 40 MG capsule TAKE 1 CAPSULE BY MOUTH EVERY DAY 90 capsule 1  . promethazine (PHENERGAN) 25 MG tablet Take 1 tablet (25 mg total) by mouth every 8 (eight) hours as needed for nausea or vomiting. 180 tablet 0  . simvastatin (ZOCOR) 40 MG tablet TAKE 1 TABLET BY MOUTH  EVERYDAY AT BEDTIME 90 tablet 0  . XARELTO 20 MG TABS tablet TAKE 1 TABLET (20 MG TOTAL) BY MOUTH DAILY WITH SUPPER. 90 tablet 0   No current facility-administered medications on file prior to visit.   Past Medical History:  Diagnosis Date  . Anemia   . Constipation   . DVT of lower extremity (deep venous thrombosis) ([place])   . Frequent headaches   . Heart murmur    found during pregnancy -  no problems  . Hypertension   . Muscle pain   . Palpitations   . Septic thrombophlebitis 08/31/2017  . Sinus problem   . Stroke [facility])    Past Surgical History:  Procedure Laterality Date  . AMPUTATION Left 07/05/2017   Procedure: LEFT ABOVE KNEE AMPUTATION;  Surgeon: Conrad Weedsport, MD;  Location: [place];  Service: Vascular;  Laterality: Left;  . CATARACT EXTRACTION W/PHACO Right 06/12/2018   Procedure: CATARACT EXTRACTION WITH INTRAOCULAR LENS IMPLANT;  Surgeon: Jalene Mullet, MD;  Location: [place];  Service: Ophthalmology;  Laterality: Right;  . COLONOSCOPY    . EMBOLECTOMY Bilateral 06/27/2017   Procedure: BILATERAL FEMORAL POPLITEAL EMBOLECTOMY;  Surgeon: Serafina Mitchell, MD;  Location: [facility];  Service: Vascular;  Laterality: Bilateral;  . FASCIOTOMY Left 06/27/2017   Procedure: FULL COMPARTMENT LEFT LOWER LEG FASCIOTOMY;  Surgeon: Serafina Mitchell, MD;  Location: [place];  Service: Vascular;  Laterality:  Left;  . PARTIAL HYSTERECTOMY    . PATCH ANGIOPLASTY Left 06/27/2017   Procedure: PATCH ANGIOPLASTY Left Posterior Tibial Artery;  Surgeon: Serafina Mitchell, MD;  Location: [place];  Service: Vascular;  Laterality: Left;  . 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 07/11/2017   Procedure: REPAIR OF COMPLEX TRACTION RETINAL DETACHMENT WITH ENDO LASER AND ANTIFUNGAL INJECTION;  Surgeon: Jalene Mullet, MD;  Location: [place];  Service: Ophthalmology;  Laterality: Right;  . TEE WITHOUT CARDIOVERSION N/A 07/11/2017   Procedure: TRANSESOPHAGEAL ECHOCARDIOGRAM (TEE);  Surgeon: Acie Fredrickson Wonda Cheng,  MD;  Location: [facility] ENDOSCOPY;  Service: Cardiovascular;  Laterality: N/A;    Family History  Problem Relation Age of Onset  . Breast cancer Mother   . Prostate cancer Father   . Prostate cancer Brother    Social History   Socioeconomic History  . Marital status: Widowed    Spouse name: Not on file  . Number of children: Not on file  . Years of education: Not on file  . Highest education level: Not on file  Occupational History  . Not on file  Tobacco Use  . Smoking status: Current Some Day Smoker    Packs/day: 0.50    Years: 53.00    Pack years: 26.50    Types: Cigarettes  . Smokeless tobacco: Never Used  Vaping Use  . Vaping Use: Never used  Substance and Sexual Activity  . Alcohol use: No  . Drug use: No  . Sexual activity: Not Currently  Other Topics Concern  . Not on file  Social History Narrative  . Not on file   Social Determinants of Health   Financial Resource Strain:   . Difficulty of Paying Living Expenses: Not on file  Food Insecurity:   . Worried About Charity fundraiser in the Last Year: Not on file  . Ran Out of Food in the Last Year: Not on file  Transportation Needs:   . Lack of Transportation (Medical): Not on file  . Lack of Transportation (Non-Medical): Not on file  Physical Activity:   . Days of Exercise per Week: Not on file  . Minutes of Exercise per Session: Not on file  Stress:   . Feeling of Stress : Not on file  Social Connections:   . Frequency of Communication with Friends and Family: Not on file  . Frequency of Social Gatherings with Friends and Family: Not on file  . Attends Religious Services: Not on file  . Active Member of Clubs or Organizations: Not on file  . Attends Archivist Meetings: Not on file  . Marital Status: Not on file    Review of Systems  Constitutional: Negative for chills, fatigue and fever.  HENT: Negative for congestion, ear pain, rhinorrhea and sore throat.   Respiratory: Negative for cough  and shortness of breath.   Cardiovascular: Negative for chest pain.  Gastrointestinal: Negative for abdominal pain, constipation, diarrhea, nausea and vomiting.  Genitourinary: Negative for dysuria and urgency.  Musculoskeletal: Negative for back pain and myalgias.  Neurological: Positive for headaches.  Psychiatric/Behavioral: Negative for dysphoric mood. The patient is not nervous/anxious.      Objective:  BP 136/68   Pulse 71   Temp (!) 97.4 F (36.3 C)   Ht 5\' 2"  (1.575 m)   Wt 176 lb (79.8 kg)   SpO2 100%   BMI 32.19 kg/m   BP/Weight 08/19/2020 07/21/2020 05/04/9832  Systolic BP 825 053 976  Diastolic BP 68 62 68  Wt. (Lbs) 176 - -  BMI 32.19 28.98 28.98    Physical Exam Vitals reviewed.  Constitutional:      Appearance: Normal appearance. She is normal weight.  Neck:     Vascular: No carotid bruit.  Cardiovascular:     Rate and Rhythm: Normal rate and regular rhythm.     Pulses: Normal pulses.     Heart sounds: Normal heart sounds.  Pulmonary:     Effort: Pulmonary effort is normal. No respiratory distress.     Breath sounds: Normal breath sounds.  Abdominal:     General: Abdomen is flat. Bowel sounds are normal.     Palpations: Abdomen is soft.     Tenderness: There is no abdominal tenderness.  Musculoskeletal:     Comments: Left AKA.  Neurological:     Mental Status: She is alert and oriented to person, place, and time.  Psychiatric:        Mood and Affect: Mood normal.        Behavior: Behavior normal.     Diabetic Foot Exam - Simple   No data filed       Lab Results  Component Value Date   WBC 8.2 05/22/2020   HGB 11.0 (L) 05/22/2020   HCT 35.5 05/22/2020   PLT 420 05/22/2020   GLUCOSE 101 (H) 05/22/2020   CHOL 156 05/22/2020   TRIG 180 (H) 05/22/2020   HDL 44 05/22/2020   LDLCALC 81 05/22/2020   ALT 19 05/22/2020   AST 22 05/22/2020   NA 136 05/22/2020   K 4.6 05/22/2020   CL 103 05/22/2020   CREATININE 1.04 (H) 05/22/2020   BUN  14 05/22/2020   CO2 21 05/22/2020   TSH 2.710 02/19/2020   INR 1.21 06/12/2018      Assessment & Plan:   1. Benign essential HTN Well controlled.  No changes to medicines.  Continue to work on eating a healthy diet and exercise.  Labs drawn today.  - Comprehensive metabolic panel  2. Mixed hyperlipidemia Well controlled.  No changes to medicines.  Continue to work on eating a healthy diet and exercise.  Labs drawn today.  - CBC with Differential/Platelet  3. Secondary hypothyroidism The current medical regimen is effective;  continue present plan and medications.  4. Intractable migraine without aura and with status migrainosus - ketorolac (TORADOL) injection 60 mg (administration charge included)  5. B12 deficiency B12 shot given today.  Administration charge.  6. PVD (peripheral vascular disease) ([place]) The current medical regimen is effective;  continue present plan and medications.  7. Unilateral AKA, left ([facility])  The current medical regimen is effective;  continue present plan and medications.  Meds ordered this encounter  Medications  . atenolol (TENORMIN) 50 MG tablet    Sig: Take 1 tablet (50 mg total) by mouth daily.    Dispense:  90 tablet    Refill:  1  . ketorolac (TORADOL) injection 60 mg  . DISCONTD: cyanocobalamin ((VITAMIN B-12)) injection 1,000 mcg    Orders Placed This Encounter  Procedures  . CBC with Differential/Platelet  . Comprehensive metabolic panel   Follow-up: Return in about 3 months (around 11/19/2020).  An After Visit Summary was printed and given to the patient.  Rochel Brome, MD [facility] [phone]

## 2020-08-20 LAB — COMPREHENSIVE METABOLIC PANEL
ALT: 28 IU/L (ref 0–32)
AST: 27 IU/L (ref 0–40)
Albumin/Globulin Ratio: 1.4 (ref 1.2–2.2)
Albumin: 4.3 g/dL (ref 3.7–4.7)
Alkaline Phosphatase: 107 IU/L (ref 44–121)
BUN/Creatinine Ratio: 12 (ref 12–28)
BUN: 11 mg/dL (ref 8–27)
Bilirubin Total: <0.2 mg/dL (ref 0.0–1.2)
CO2: 24 mmol/L (ref 20–29)
Calcium: 9.5 mg/dL (ref 8.7–10.3)
Chloride: 102 mmol/L (ref 96–106)
Creatinine, Ser: 0.95 mg/dL (ref 0.57–1.00)
GFR calc Af Amer: 70 mL/min/{1.73_m2} (ref >59)
GFR calc non Af Amer: 60 mL/min/{1.73_m2} (ref >59)
Globulin, Total: 3 g/dL (ref 1.5–4.5)
Glucose: 99 mg/dL (ref 65–99)
Potassium: 5.6 mmol/L — ABNORMAL HIGH (ref 3.5–5.2)
Sodium: 138 mmol/L (ref 134–144)
Total Protein: 7.3 g/dL (ref 6.0–8.5)

## 2020-08-20 LAB — CARDIOVASCULAR RISK ASSESSMENT

## 2020-08-20 LAB — CBC WITH DIFFERENTIAL/PLATELET
Basophils Absolute: 0.1 10*3/uL (ref 0.0–0.2)
Basos: 1 %
EOS (ABSOLUTE): 0.5 10*3/uL — ABNORMAL HIGH (ref 0.0–0.4)
Eos: 6 %
Hematocrit: 36.5 % (ref 34.0–46.6)
Hemoglobin: 11.5 g/dL (ref 11.1–15.9)
Immature Grans (Abs): 0 10*3/uL (ref 0.0–0.1)
Immature Granulocytes: 1 %
Lymphocytes Absolute: 2.6 10*3/uL (ref 0.7–3.1)
Lymphs: 31 %
MCH: 24.8 pg — ABNORMAL LOW (ref 26.6–33.0)
MCHC: 31.5 g/dL (ref 31.5–35.7)
MCV: 79 fL (ref 79–97)
Monocytes Absolute: 0.9 10*3/uL (ref 0.1–0.9)
Monocytes: 11 %
Neutrophils Absolute: 4.4 10*3/uL (ref 1.4–7.0)
Neutrophils: 50 %
Platelets: 512 10*3/uL — ABNORMAL HIGH (ref 150–450)
RBC: 4.63 x10E6/uL (ref 3.77–5.28)
RDW: 16.1 % — ABNORMAL HIGH (ref 11.7–15.4)
WBC: 8.5 10*3/uL (ref 3.4–10.8)

## 2020-08-20 LAB — LIPID PANEL
Chol/HDL Ratio: 3.7 ratio (ref 0.0–4.4)
Cholesterol, Total: 166 mg/dL (ref 100–199)
HDL: 45 mg/dL (ref >39)
LDL Chol Calc (NIH): 91 mg/dL (ref 0–99)
Triglycerides: 172 mg/dL — ABNORMAL HIGH (ref 0–149)
VLDL Cholesterol Cal: 30 mg/dL (ref 5–40)

## 2020-08-25 ENCOUNTER — Other Ambulatory Visit: Payer: Self-pay

## 2020-08-25 DIAGNOSIS — E782 Mixed hyperlipidemia: Secondary | ICD-10-CM

## 2020-08-25 MED ORDER — OMEGA-3-ACID ETHYL ESTERS 1 G PO CAPS: 2.0000 g | ORAL_CAPSULE | Freq: Two times a day (BID) | ORAL | 1 refills | Status: DC

## 2020-09-08 ENCOUNTER — Other Ambulatory Visit: Payer: Self-pay | Admitting: Family Medicine

## 2020-10-02 ENCOUNTER — Other Ambulatory Visit: Payer: Self-pay

## 2020-10-02 ENCOUNTER — Other Ambulatory Visit: Payer: Self-pay | Admitting: Family Medicine

## 2020-10-02 MED ORDER — PROMETHAZINE HCL 25 MG PO TABS: 25.0000 mg | ORAL_TABLET | Freq: Three times a day (TID) | ORAL | 0 refills | Status: DC | PRN

## 2020-10-16 ENCOUNTER — Other Ambulatory Visit: Payer: Self-pay

## 2020-10-16 DIAGNOSIS — E782 Mixed hyperlipidemia: Secondary | ICD-10-CM

## 2020-10-16 MED ORDER — SIMVASTATIN 40 MG PO TABS: ORAL_TABLET | ORAL | 0 refills | Status: DC

## 2020-10-22 ENCOUNTER — Other Ambulatory Visit: Payer: Self-pay

## 2020-10-22 MED ORDER — PROMETHAZINE HCL 25 MG PO TABS: 25.0000 mg | ORAL_TABLET | Freq: Three times a day (TID) | ORAL | 0 refills | Status: DC | PRN

## 2020-10-22 MED ORDER — BUTALBITAL-APAP-CAFFEINE 50-325-40 MG PO TABS: 1.0000 | ORAL_TABLET | Freq: Four times a day (QID) | ORAL | 2 refills | Status: DC | PRN

## 2020-11-05 ENCOUNTER — Other Ambulatory Visit: Payer: Self-pay

## 2020-11-05 DIAGNOSIS — E038 Other specified hypothyroidism: Secondary | ICD-10-CM

## 2020-11-05 MED ORDER — LEVOTHYROXINE SODIUM 25 MCG PO TABS: 25.0000 ug | ORAL_TABLET | Freq: Every day | ORAL | 0 refills | Status: DC

## 2020-11-20 ENCOUNTER — Other Ambulatory Visit: Payer: Self-pay | Admitting: Physician Assistant

## 2020-11-20 DIAGNOSIS — I10 Essential (primary) hypertension: Secondary | ICD-10-CM

## 2020-11-23 NOTE — Progress Notes (Signed)
Subjective:  Patient ID: Anna Hayes, female    DOB: 1951/06/15  Age: 70 y.o. MRN: [id number]  Chief Complaint  Patient presents with  . Hypertension  . Hyperlipidemia    HPI Mixed hyperlipidemia - Patient takes lovaza 2 gr twice a day, simvastatin 40 mg daily. Eating healthy  Benign essential HTN - Patient is taking atenolol 50 mg daily, lisinopril 20 mg daily, and lasix 20 mg once daily prn.  Secondary hypothyroidism - Patient is currently taking levothyroxine 25 mcg daily.  Intractable migraine without aura and with status migrainosus: Patient taking fiorecet prn.   PVD (peripheral vascular disease)/Left AKA. Pt does well with ADLs.   Tobacco use disorder, continuous: pt is down to a 1/4 ppd.    Spastic hemiparesis of nondominant left side:  Pt has been able to accomodate for disability.   Current Outpatient Medications on File Prior to Visit  Medication Sig Dispense Refill  . atenolol (TENORMIN) 50 MG tablet Take 1 tablet (50 mg total) by mouth daily. 90 tablet 1  . butalbital-acetaminophen-caffeine (FIORICET) 50-325-40 MG tablet Take 1 tablet by mouth every 6 (six) hours as needed for migraine. 30 tablet 2  . cyanocobalamin (,VITAMIN B-12,) 1000 MCG/ML injection Inject 1 mL (1,000 mcg total) into the muscle every 30 (thirty) days. 1 mL 5  . furosemide (LASIX) 20 MG tablet Take 20 mg by mouth daily as needed for fluid.     Marland Kitchen levothyroxine (SYNTHROID) 25 MCG tablet Take 1 tablet (25 mcg total) by mouth daily. 90 tablet 0  . LINZESS 145 MCG CAPS capsule TAKE 1 CAPSULE (145 MCG TOTAL) BY MOUTH DAILY BEFORE BREAKFAST. 90 capsule 1  . lisinopril (ZESTRIL) 20 MG tablet TAKE 1 TABLET BY MOUTH EVERY DAY AS DIRECTED 90 tablet 1  . omega-3 acid ethyl esters (LOVAZA) 1 g capsule Take 2 capsules (2 g total) by mouth 2 (two) times daily. 360 capsule 1  . omeprazole (PRILOSEC) 40 MG capsule TAKE 1 CAPSULE BY MOUTH EVERY DAY 90 capsule 1  . promethazine (PHENERGAN) 25 MG tablet  Take 1 tablet (25 mg total) by mouth every 8 (eight) hours as needed for nausea or vomiting. 60 tablet 0  . simvastatin (ZOCOR) 40 MG tablet TAKE 1 TABLET BY MOUTH EVERYDAY AT BEDTIME 90 tablet 0  . XARELTO 20 MG TABS tablet TAKE 1 TABLET (20 MG TOTAL) BY MOUTH DAILY WITH SUPPER. 90 tablet 1   No current facility-administered medications on file prior to visit.   Past Medical History:  Diagnosis Date  . Anemia   . Constipation   . DVT of lower extremity (deep venous thrombosis) ([place])   . Frequent headaches   . Heart murmur    found during pregnancy -  no problems  . Hypertension   . Muscle pain   . Palpitations   . Septic thrombophlebitis 08/31/2017  . Sinus problem   . Stroke [facility])    Past Surgical History:  Procedure Laterality Date  . AMPUTATION Left 07/05/2017   Procedure: LEFT ABOVE KNEE AMPUTATION;  Surgeon: Conrad Little [place], MD;  Location: [place];  Service: Vascular;  Laterality: Left;  . CATARACT EXTRACTION W/PHACO Right 06/12/2018   Procedure: CATARACT EXTRACTION WITH INTRAOCULAR LENS IMPLANT;  Surgeon: Jalene Mullet, MD;  Location: [place];  Service: Ophthalmology;  Laterality: Right;  . COLONOSCOPY    . EMBOLECTOMY Bilateral 06/27/2017   Procedure: BILATERAL FEMORAL POPLITEAL EMBOLECTOMY;  Surgeon: Serafina Mitchell, MD;  Location: [place];  Service: Vascular;  Laterality:  Bilateral;  . FASCIOTOMY Left 06/27/2017   Procedure: FULL COMPARTMENT LEFT LOWER LEG FASCIOTOMY;  Surgeon: Serafina Mitchell, MD;  Location: [place];  Service: Vascular;  Laterality: Left;  . PARTIAL HYSTERECTOMY    . PATCH ANGIOPLASTY Left 06/27/2017   Procedure: PATCH ANGIOPLASTY Left Posterior Tibial Artery;  Surgeon: Serafina Mitchell, MD;  Location: [place];  Service: Vascular;  Laterality: Left;  . 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 07/11/2017   Procedure: REPAIR OF COMPLEX TRACTION RETINAL DETACHMENT WITH ENDO LASER AND ANTIFUNGAL INJECTION;  Surgeon: Jalene Mullet, MD;  Location: [place];   Service: Ophthalmology;  Laterality: Right;  . TEE WITHOUT CARDIOVERSION N/A 07/11/2017   Procedure: TRANSESOPHAGEAL ECHOCARDIOGRAM (TEE);  Surgeon: Acie Fredrickson Wonda Cheng, MD;  Location: [facility] ENDOSCOPY;  Service: Cardiovascular;  Laterality: N/A;    Family History  Problem Relation Age of Onset  . Breast cancer Mother   . Prostate cancer Father   . Prostate cancer Brother    Social History   Socioeconomic History  . Marital status: Widowed    Spouse name: Not on file  . Number of children: Not on file  . Years of education: Not on file  . Highest education level: Not on file  Occupational History  . Not on file  Tobacco Use  . Smoking status: Current Some Day Smoker    Packs/day: 0.25    Years: 53.00    Pack years: 13.25    Types: Cigarettes  . Smokeless tobacco: Never Used  Vaping Use  . Vaping Use: Never used  Substance and Sexual Activity  . Alcohol use: No  . Drug use: No  . Sexual activity: Not Currently  Other Topics Concern  . Not on file  Social History Narrative  . Not on file   Social Determinants of Health   Financial Resource Strain: Not on file  Food Insecurity: Not on file  Transportation Needs: Not on file  Physical Activity: Not on file  Stress: Not on file  Social Connections: Not on file    Review of Systems  Constitutional: Negative for chills, fatigue and fever.  HENT: Positive for rhinorrhea. Negative for congestion, ear pain and sore throat.   Respiratory: Negative for cough and shortness of breath.   Cardiovascular: Negative for chest pain and palpitations.  Gastrointestinal: Negative for abdominal pain, constipation, diarrhea, nausea and vomiting.  Endocrine: Negative for polydipsia and polyphagia.  Genitourinary: Negative for dysuria.  Musculoskeletal: Negative for arthralgias, back pain and myalgias.  Skin: Negative for rash.  Neurological: Positive for headaches.  Psychiatric/Behavioral: Negative for dysphoric mood. The patient is not  nervous/anxious.      Objective:  BP 132/68   Pulse 72   Temp (!) 97.5 F (36.4 C)   Resp 18   BP/Weight 11/24/2020 08/19/2020 54/62/7035  Systolic BP 009 381 829  Diastolic BP 68 68 62  Wt. (Lbs) - 176 -  BMI - 32.19 28.98    Physical Exam Vitals reviewed.  Constitutional:      Appearance: Normal appearance.  Neck:     Vascular: No carotid bruit.  Cardiovascular:     Rate and Rhythm: Normal rate and regular rhythm.     Heart sounds: Normal heart sounds.  Pulmonary:     Effort: Pulmonary effort is normal.     Breath sounds: Normal breath sounds.  Abdominal:     General: Bowel sounds are normal.     Palpations: Abdomen is soft.     Tenderness: There is no  abdominal tenderness.  Musculoskeletal:     Comments: Left extremity AKA.    Neurological:     Mental Status: She is alert and oriented to person, place, and time.  Psychiatric:        Mood and Affect: Mood normal.        Behavior: Behavior normal.    Lab Results  Component Value Date   WBC 8.5 11/24/2020   HGB 11.1 11/24/2020   HCT 34.5 11/24/2020   PLT 452 (H) 11/24/2020   GLUCOSE 88 11/24/2020   CHOL 125 11/24/2020   TRIG 137 11/24/2020   HDL 42 11/24/2020   LDLCALC 59 11/24/2020   ALT 21 11/24/2020   AST 21 11/24/2020   NA 141 11/24/2020   K 5.7 (H) 11/24/2020   CL 105 11/24/2020   CREATININE 0.98 11/24/2020   BUN 15 11/24/2020   CO2 22 11/24/2020   TSH 2.590 11/24/2020   INR 1.21 06/12/2018      Assessment & Plan:   1. Mixed hyperlipidemia Well controlled.  No changes to medicines.  Continue to work on eating a healthy diet and exercise.  Labs drawn today.  - Lipid panel  2. Benign essential HTN Well controlled.  No changes to medicines.  Continue to work on eating a healthy diet and exercise.  Labs drawn today.  - Comprehensive metabolic panel - CBC with Differential/Platelet  3. Secondary hypothyroidism The current medical regimen is effective;  continue present plan and  medications. - TSH  4. Intractable migraine without aura and with status migrainosus Continue fiorecet prn.   5. PVD (peripheral vascular disease) ([place]) Continue simvastatin, xarelto, and lovaza.   6. Tobacco use disorder, continuous Recommend cessation.  7. Spastic hemiparesis of left nondominant side (HCC) Stable. Continue xarelto  8. Amputation of left lower extremity above knee upon examination ([place])  In wheelchair.   9. Acquired thrombophilia. Secondary to thrombophilia.   Orders Placed This Encounter  Procedures  . Comprehensive metabolic panel  . Lipid panel  . TSH  . CBC with Differential/Platelet  . Cardiovascular Risk Assessment     Follow-up: Return in about 3 months (around 02/24/2021) for fasting.  An After Visit Summary was printed and given to the patient.  Rochel Brome, MD [facility] [phone]

## 2020-11-24 ENCOUNTER — Other Ambulatory Visit: Payer: Self-pay

## 2020-11-24 ENCOUNTER — Encounter: Payer: Self-pay | Admitting: Family Medicine

## 2020-11-24 ENCOUNTER — Ambulatory Visit (INDEPENDENT_AMBULATORY_CARE_PROVIDER_SITE_OTHER): Payer: Medicare Other | Admitting: Family Medicine

## 2020-11-24 VITALS — BP 132/68 | HR 72 | Temp 97.5°F | Resp 18 | Ht 62.0 in

## 2020-11-24 DIAGNOSIS — G8114 Spastic hemiplegia affecting left nondominant side: Secondary | ICD-10-CM

## 2020-11-24 DIAGNOSIS — D6869 Other thrombophilia: Secondary | ICD-10-CM

## 2020-11-24 DIAGNOSIS — I739 Peripheral vascular disease, unspecified: Secondary | ICD-10-CM | POA: Diagnosis not present

## 2020-11-24 DIAGNOSIS — I10 Essential (primary) hypertension: Secondary | ICD-10-CM

## 2020-11-24 DIAGNOSIS — E782 Mixed hyperlipidemia: Secondary | ICD-10-CM

## 2020-11-24 DIAGNOSIS — F17209 Nicotine dependence, unspecified, with unspecified nicotine-induced disorders: Secondary | ICD-10-CM

## 2020-11-24 DIAGNOSIS — G43011 Migraine without aura, intractable, with status migrainosus: Secondary | ICD-10-CM | POA: Diagnosis not present

## 2020-11-24 DIAGNOSIS — S78112A Complete traumatic amputation at level between left hip and knee, initial encounter: Secondary | ICD-10-CM

## 2020-11-24 DIAGNOSIS — E038 Other specified hypothyroidism: Secondary | ICD-10-CM | POA: Diagnosis not present

## 2020-11-25 LAB — COMPREHENSIVE METABOLIC PANEL
ALT: 21 IU/L (ref 0–32)
AST: 21 IU/L (ref 0–40)
Albumin/Globulin Ratio: 1.5 (ref 1.2–2.2)
Albumin: 4.1 g/dL (ref 3.7–4.7)
Alkaline Phosphatase: 104 IU/L (ref 44–121)
BUN/Creatinine Ratio: 15 (ref 12–28)
BUN: 15 mg/dL (ref 8–27)
Bilirubin Total: <0.2 mg/dL (ref 0.0–1.2)
CO2: 22 mmol/L (ref 20–29)
Calcium: 9.4 mg/dL (ref 8.7–10.3)
Chloride: 105 mmol/L (ref 96–106)
Creatinine, Ser: 0.98 mg/dL (ref 0.57–1.00)
Globulin, Total: 2.7 g/dL (ref 1.5–4.5)
Glucose: 88 mg/dL (ref 65–99)
Potassium: 5.7 mmol/L — ABNORMAL HIGH (ref 3.5–5.2)
Sodium: 141 mmol/L (ref 134–144)
Total Protein: 6.8 g/dL (ref 6.0–8.5)
eGFR: 62 mL/min/{1.73_m2} (ref >59)

## 2020-11-25 LAB — CBC WITH DIFFERENTIAL/PLATELET
Basophils Absolute: 0.1 10*3/uL (ref 0.0–0.2)
Basos: 1 %
EOS (ABSOLUTE): 0.4 10*3/uL (ref 0.0–0.4)
Eos: 5 %
Hematocrit: 34.5 % (ref 34.0–46.6)
Hemoglobin: 11.1 g/dL (ref 11.1–15.9)
Immature Grans (Abs): 0 10*3/uL (ref 0.0–0.1)
Immature Granulocytes: 0 %
Lymphocytes Absolute: 2.8 10*3/uL (ref 0.7–3.1)
Lymphs: 32 %
MCH: 25.5 pg — ABNORMAL LOW (ref 26.6–33.0)
MCHC: 32.2 g/dL (ref 31.5–35.7)
MCV: 79 fL (ref 79–97)
Monocytes Absolute: 0.8 10*3/uL (ref 0.1–0.9)
Monocytes: 10 %
Neutrophils Absolute: 4.5 10*3/uL (ref 1.4–7.0)
Neutrophils: 52 %
Platelets: 452 10*3/uL — ABNORMAL HIGH (ref 150–450)
RBC: 4.35 x10E6/uL (ref 3.77–5.28)
RDW: 15.7 % — ABNORMAL HIGH (ref 11.7–15.4)
WBC: 8.5 10*3/uL (ref 3.4–10.8)

## 2020-11-25 LAB — LIPID PANEL
Chol/HDL Ratio: 3 ratio (ref 0.0–4.4)
Cholesterol, Total: 125 mg/dL (ref 100–199)
HDL: 42 mg/dL (ref >39)
LDL Chol Calc (NIH): 59 mg/dL (ref 0–99)
Triglycerides: 137 mg/dL (ref 0–149)
VLDL Cholesterol Cal: 24 mg/dL (ref 5–40)

## 2020-11-25 LAB — CARDIOVASCULAR RISK ASSESSMENT

## 2020-11-25 LAB — TSH: TSH: 2.59 u[IU]/mL (ref 0.450–4.500)

## 2020-12-08 ENCOUNTER — Other Ambulatory Visit: Payer: Self-pay | Admitting: Family Medicine

## 2020-12-08 DIAGNOSIS — E538 Deficiency of other specified B group vitamins: Secondary | ICD-10-CM

## 2020-12-09 DIAGNOSIS — Z1231 Encounter for screening mammogram for malignant neoplasm of breast: Secondary | ICD-10-CM | POA: Diagnosis not present

## 2020-12-10 ENCOUNTER — Other Ambulatory Visit: Payer: Self-pay | Admitting: Physician Assistant

## 2020-12-10 DIAGNOSIS — E875 Hyperkalemia: Secondary | ICD-10-CM

## 2020-12-14 ENCOUNTER — Other Ambulatory Visit: Payer: Self-pay

## 2020-12-14 ENCOUNTER — Other Ambulatory Visit: Payer: Medicare Other

## 2020-12-14 DIAGNOSIS — E875 Hyperkalemia: Secondary | ICD-10-CM | POA: Diagnosis not present

## 2020-12-14 MED ORDER — PROMETHAZINE HCL 25 MG PO TABS: 25.0000 mg | ORAL_TABLET | Freq: Three times a day (TID) | ORAL | 0 refills | Status: DC | PRN

## 2020-12-15 LAB — COMPREHENSIVE METABOLIC PANEL
ALT: 24 IU/L (ref 0–32)
AST: 25 IU/L (ref 0–40)
Albumin/Globulin Ratio: 1.4 (ref 1.2–2.2)
Albumin: 4.2 g/dL (ref 3.7–4.7)
Alkaline Phosphatase: 97 IU/L (ref 44–121)
BUN/Creatinine Ratio: 18 (ref 12–28)
BUN: 16 mg/dL (ref 8–27)
Bilirubin Total: <0.2 mg/dL (ref 0.0–1.2)
CO2: 20 mmol/L (ref 20–29)
Calcium: 9.2 mg/dL (ref 8.7–10.3)
Chloride: 103 mmol/L (ref 96–106)
Creatinine, Ser: 0.88 mg/dL (ref 0.57–1.00)
Globulin, Total: 3 g/dL (ref 1.5–4.5)
Glucose: 98 mg/dL (ref 65–99)
Potassium: 5.2 mmol/L (ref 3.5–5.2)
Sodium: 139 mmol/L (ref 134–144)
Total Protein: 7.2 g/dL (ref 6.0–8.5)
eGFR: 70 mL/min/{1.73_m2} (ref >59)

## 2021-01-04 ENCOUNTER — Other Ambulatory Visit: Payer: Self-pay

## 2021-01-04 ENCOUNTER — Ambulatory Visit (INDEPENDENT_AMBULATORY_CARE_PROVIDER_SITE_OTHER): Payer: Medicare Other | Admitting: Nurse Practitioner

## 2021-01-04 ENCOUNTER — Encounter: Payer: Self-pay | Admitting: Nurse Practitioner

## 2021-01-04 ENCOUNTER — Ambulatory Visit: Payer: Medicare Other | Admitting: Family Medicine

## 2021-01-04 VITALS — BP 136/64 | HR 72 | Temp 97.7°F | Resp 16 | Ht 62.0 in | Wt 190.0 lb

## 2021-01-04 DIAGNOSIS — I739 Peripheral vascular disease, unspecified: Secondary | ICD-10-CM

## 2021-01-04 DIAGNOSIS — Z7409 Other reduced mobility: Secondary | ICD-10-CM | POA: Diagnosis not present

## 2021-01-04 DIAGNOSIS — G5792 Unspecified mononeuropathy of left lower limb: Secondary | ICD-10-CM | POA: Diagnosis not present

## 2021-01-04 DIAGNOSIS — S78112A Complete traumatic amputation at level between left hip and knee, initial encounter: Secondary | ICD-10-CM | POA: Diagnosis not present

## 2021-01-04 DIAGNOSIS — I6930 Unspecified sequelae of cerebral infarction: Secondary | ICD-10-CM | POA: Diagnosis not present

## 2021-01-04 DIAGNOSIS — G8194 Hemiplegia, unspecified affecting left nondominant side: Secondary | ICD-10-CM | POA: Diagnosis not present

## 2021-01-04 NOTE — Patient Instructions (Addendum)
Continue prescribed medications We will complete mobility screening  Pick up paperwork tomorrow for power wheelchair Bring B12 tomorrow for injection, nurse visit Follow-up June 6th, 2022 at 11:00 am   How to Use a Wheelchair A wheelchair is an assistive device that helps a person go from place to place. What are the risks? It is generally safe to use your wheelchair as instructed. However, there are some risks, including:  Sores. These may develop on your body from rubbing or from long-term contact with parts of your wheelchair.  Injury. Take care to prevent injuries while using a wheelchair, including falls and hand or foot injuries while you operate the wheelchair. Supplies needed:  Gloves. These are helpful if you have a manual wheelchair.  Padded seat. Extra padding may help decrease the risk of pressure sores.  Back support. This may be needed depending on your condition.  Other supplies as told by your health care provider. Types of wheelchairs There are two types of wheelchairs. Your health care provider will do an assessment to recommend the best type of wheelchair for you.  Manual wheelchairs. ? Your hands and arms operate this type of wheelchair. ? You need to have good upper body strength to use this type of wheelchair.  Power wheelchairs. ? These are also called electric or motorized wheelchairs. ? A battery provides power to the wheelchair. ? These may be designed for indoor use only or for both indoor and outdoor use. ? Additional features may include special seating with power tilt and recline settings, a headrest, and power leg rests. ? Hand controls operate this type of wheelchair. If you cannot use your hands, you can use different types of controls. You will be taught how to use your specific wheelchair. It is important to bring a caregiver with you who will also learn how to use your wheelchair. Follow any safety instructions as told by your health care  provider.   How to use a wheelchair Manual wheelchair  To move, use your hands to push the rims of the wheels forward or to pull the rims backward.  To turn, hold the wheel steady on the side that you wish to turn toward while you use your other hand to move the opposite wheel. ? For example, if you want to turn left, hold the left wheel steady while you use your right hand to move the right wheel forward.  To make a sharp spin, push one wheel forward while you pull the other wheel backward at the same time.  To stay still, use the brake. Power wheelchair  To move, push the hand control in the direction that you want to go. ? For example, if you want to turn left, push the hand control to the left.  To use the other options on the hand control, follow the wheelchair instructions. Each wheelchair is different. Talk with the supplier if you have questions. General tips on safety  If you have a manual wheelchair, consider wearing gloves to protect your hands when you operate your wheelchair.  If you have a power wheelchair, set the speed to a low setting and practice in an open area at first.  Avoid curbs and bumps when possible. If you must drive over bumps, back over them. If you have a manual wheelchair, learn how to pop up the front of your wheelchair to pass over small bumps.  When you use your wheelchair on rough ground, move slowly.  Use special wheelchair ramps and lifts  to navigate in and out of buildings and vehicles.  Wear a seat belt.  Keep your feet firmly on the footrest.  Use the wheel locks to keep the wheelchair steady when you: ? Get in and out of your wheelchair. ? Remain in your wheelchair while you travel in a [place] or bus.  Learn how to transfer in and out of your wheelchair safely. Your caregiver should also learn how to help with transfers. Contact a health care provider if:  You develop sores on your body from rubbing or from long-term contact with parts  of the wheelchair.  You are injured while using your wheelchair. Summary  A wheelchair is an assistive device that helps a person go from place to place.  Generally, it is safe to use a wheelchair as instructed. However, it may cause sores or injuries. Use gloves and a padded seat to prevent common injuries.  There are two types of wheelchairs: manual wheelchairs and power wheelchairs.  You will be taught how to use your specific wheelchair. It is important to bring a caregiver with you who will also learn how to use your wheelchair.  Contact a health care provider if you develop sores on your body from rubbing or from long-term contact with parts of the wheelchair, or if you are injured while using your wheelchair. This information is not intended to replace advice given to you by your health care provider. Make sure you discuss any questions you have with your health care provider. Document Revised: 09/13/2018 Document Reviewed: 09/13/2018 Elsevier Patient Education  2021 [employer].

## 2021-01-04 NOTE — Progress Notes (Addendum)
Established Patient Office Visit  Subjective:  Patient ID: Anna Hayes, female    DOB: 04-02-1951  Age: 70 y.o. MRN: [id number]  CC:  Chief Complaint  Patient presents with  . mobility examination    HPI Anna Hayes is a 70 year old African American female that presents for mobility exam for power wheelchair. She is currently in a non-powered wheelchair. She has multiple co-morbidities and ambulation barriers that require need for power wheel-chair. She has undergone left above-the-knee amputation, left side hemiplegia secondary to CVA, and physical debility.Pt is dependent on assistance with mobility. She is unable to use a manual wheelchair because she cannot self-propel, she has decreased range-of-motion in arms in shoulder due to left hemiplegia from CVA. She cannot propel manual wheelchair due to 0/5 grip strength in left upper extremity, left hemiparesis with left UE contracted, hand in closed-fist position. She cannot propel manual wheelchair due to 0/5 strength to left lower extremity due to above-the-knee amputation and hemiparesis.    Evaluation for POW:  The patient presents today for a mobility evaluation. The patient is unable to perform the following Mobility-Related Activities of Daily Living (MRADLs) safely or within a normal time period at home: bathing, grooming, dressing, feeding, toileting, moving from room to room, meal prep, and home management. The patient requires  a wheelchair or a caregiver for ambulation. The patient has the ability to move 2 feet using the assistive devices. She is non-ambulatory. The patient is unable to stand from a seated position without assistance. The patient has the physical and mental abilities to operate a power wheelchair in her home. The patient is willing and motivated to use a power chair in the home. A scooter will not work because left above the knee amputation and she unable to maneuver in home due to space restrictions.  She has 3/5 strength to RUE, 0/5 strength to LUE ; she has 3/5 strength to RLE, and 0/5 strength to LLE.She cannot perform functional weight shift without assistance. She would be able to safely transfer to/from a power mobility device with assistance in 2 minutes.   Past Medical History:  Diagnosis Date  . Anemia   . Constipation   . DVT of lower extremity (deep venous thrombosis) ([place])   . Frequent headaches   . Heart murmur    found during pregnancy -  no problems  . Hypertension   . Muscle pain   . Palpitations   . Septic thrombophlebitis 08/31/2017  . Sinus problem   . Stroke [facility])     Past Surgical History:  Procedure Laterality Date  . AMPUTATION Left 07/05/2017   Procedure: LEFT ABOVE KNEE AMPUTATION;  Surgeon: Conrad [place]liet, MD;  Location: [place];  Service: Vascular;  Laterality: Left;  . CATARACT EXTRACTION W/PHACO Right 06/12/2018   Procedure: CATARACT EXTRACTION WITH INTRAOCULAR LENS IMPLANT;  Surgeon: Jalene Mullet, MD;  Location: [place];  Service: Ophthalmology;  Laterality: Right;  . COLONOSCOPY    . EMBOLECTOMY Bilateral 06/27/2017   Procedure: BILATERAL FEMORAL POPLITEAL EMBOLECTOMY;  Surgeon: Serafina Mitchell, MD;  [facility];  Service: Vascular;  Laterality: Bilateral;  . FASCIOTOMY Left 06/27/2017   Procedure: FULL COMPARTMENT LEFT LOWER LEG FASCIOTOMY;  Surgeon: Serafina Mitchell, MD;  Location: [place];  Service: Vascular;  Laterality: Left;  . PARTIAL HYSTERECTOMY    . PATCH ANGIOPLASTY Left 06/27/2017   Procedure: PATCH ANGIOPLASTY Left Posterior Tibial Artery;  Surgeon: Serafina Mitchell, MD;  Location: [place];  Service: Vascular;  Laterality: Left;  . 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 07/11/2017   Procedure: REPAIR OF COMPLEX TRACTION RETINAL DETACHMENT WITH ENDO LASER AND ANTIFUNGAL INJECTION;  Surgeon: Jalene Mullet, MD;  Location: [place];  Service: Ophthalmology;  Laterality: Right;  . TEE WITHOUT CARDIOVERSION N/A 07/11/2017   Procedure:  TRANSESOPHAGEAL ECHOCARDIOGRAM (TEE);  Surgeon: Acie Fredrickson Wonda Cheng, MD;  Location: [facility] ENDOSCOPY;  Service: Cardiovascular;  Laterality: N/A;    Family History  Problem Relation Age of Onset  . Breast cancer Mother   . Prostate cancer Father   . Prostate cancer Brother     Social History   Socioeconomic History  . Marital status: Widowed    Spouse name: Not on file  . Number of children: Not on file  . Years of education: Not on file  . Highest education level: Not on file  Occupational History  . Not on file  Tobacco Use  . Smoking status: Current Some Day Smoker    Packs/day: 0.25    Years: 53.00    Pack years: 13.25    Types: Cigarettes  . Smokeless tobacco: Never Used  Vaping Use  . Vaping Use: Never used  Substance and Sexual Activity  . Alcohol use: No  . Drug use: No  . Sexual activity: Not Currently  Other Topics Concern  . Not on file  Social History Narrative  . Not on file   Social Determinants of Health   Financial Resource Strain: Not on file  Food Insecurity: Not on file  Transportation Needs: Not on file  Physical Activity: Not on file  Stress: Not on file  Social Connections: Not on file  Intimate Partner Violence: Not on file    Outpatient Medications Prior to Visit  Medication Sig Dispense Refill  . atenolol (TENORMIN) 50 MG tablet Take 1 tablet (50 mg total) by mouth daily. 90 tablet 1  . butalbital-acetaminophen-caffeine (FIORICET) 50-325-40 MG tablet Take 1 tablet by mouth every 6 (six) hours as needed for migraine. 30 tablet 2  . cyanocobalamin (,VITAMIN B-12,) 1000 MCG/ML injection INJECT 1 ML (1,000 MCG TOTAL) INTO THE MUSCLE EVERY 30 (THIRTY) DAYS. 3 mL 1  . furosemide (LASIX) 20 MG tablet Take 20 mg by mouth daily as needed for fluid.     Marland Kitchen levothyroxine (SYNTHROID) 25 MCG tablet Take 1 tablet (25 mcg total) by mouth daily. 90 tablet 0  . LINZESS 145 MCG CAPS capsule TAKE 1 CAPSULE (145 MCG TOTAL) BY MOUTH DAILY BEFORE BREAKFAST. 90  capsule 1  . lisinopril (ZESTRIL) 20 MG tablet TAKE 1 TABLET BY MOUTH EVERY DAY AS DIRECTED 90 tablet 1  . omega-3 acid ethyl esters (LOVAZA) 1 g capsule Take 2 capsules (2 g total) by mouth 2 (two) times daily. 360 capsule 1  . omeprazole (PRILOSEC) 40 MG capsule TAKE 1 CAPSULE BY MOUTH EVERY DAY 90 capsule 1  . promethazine (PHENERGAN) 25 MG tablet Take 1 tablet (25 mg total) by mouth every 8 (eight) hours as needed for nausea or vomiting. 60 tablet 0  . simvastatin (ZOCOR) 40 MG tablet TAKE 1 TABLET BY MOUTH EVERYDAY AT BEDTIME 90 tablet 0  . XARELTO 20 MG TABS tablet TAKE 1 TABLET (20 MG TOTAL) BY MOUTH DAILY WITH SUPPER. 90 tablet 1   No facility-administered medications prior to visit.    Allergies  Allergen Reactions  . Latex Other (See Comments)    Blisters  . Codeine Other (See Comments)    UNSPECIFIED REACTION   . Lyrica [Pregabalin]  Hives  . Nurtec [Rimegepant Sulfate] Other (See Comments)    Stomach upset  . Penicillins Hives, Swelling and Other (See Comments)    Has patient had a PCN reaction causing immediate rash, facial/tongue/throat swelling, SOB or lightheadedness with hypotension: Yes Has patient had a PCN reaction causing severe rash involving mucus membranes or skin necrosis: No Has patient had a PCN reaction that required hospitalization: No Has patient had a PCN reaction occurring within the last 10 years: No If all of the above answers are "NO", then may proceed with Cephalosporin use.   . Amlodipine Other (See Comments)    Pedal edema  . Aspirin Palpitations and Other (See Comments)    Abdominal pain  . Sulfa Antibiotics Rash    ROS Review of Systems  Constitutional: Positive for fatigue. Negative for appetite change and unexpected weight change.  HENT: Negative for congestion, ear pain, rhinorrhea, sinus pressure, sinus pain and tinnitus.   Eyes: Negative for pain.  Respiratory: Negative for cough and shortness of breath.   Cardiovascular: Negative  for chest pain, palpitations and leg swelling.  Gastrointestinal: Negative for abdominal pain, constipation, diarrhea, nausea and vomiting.  Endocrine: Negative for cold intolerance, heat intolerance, polydipsia, polyphagia and polyuria.  Genitourinary: Negative for dysuria, frequency and hematuria.  Musculoskeletal: Positive for arthralgias. Negative for back pain, joint swelling and myalgias.  Skin: Negative for rash.  Allergic/Immunologic: Negative for environmental allergies.  Neurological: Positive for weakness and numbness. Negative for dizziness and headaches.       Left hemiparesis  Hematological: Negative for adenopathy.  Psychiatric/Behavioral: Negative for decreased concentration and sleep disturbance. The patient is not nervous/anxious.       Objective:    Physical Exam Vitals reviewed.  Constitutional:      Appearance: Normal appearance.  HENT:     Head: Normocephalic.     Mouth/Throat:     Mouth: Mucous membranes are moist.  Cardiovascular:     Rate and Rhythm: Normal rate and regular rhythm.     Pulses: Normal pulses.     Heart sounds: Normal heart sounds.  Pulmonary:     Effort: Pulmonary effort is normal.     Breath sounds: Normal breath sounds.  Abdominal:     General: Bowel sounds are normal.     Palpations: Abdomen is soft.  Musculoskeletal:        General: Deformity present.     Right hand: Normal. Deformity: contracted in closed position.     Left hand: Deformity present. Decreased range of motion. Decreased strength. Decreased sensation.  Skin:    General: Skin is warm and dry.     Capillary Refill: Capillary refill takes less than 2 seconds.  Neurological:     General: No focal deficit present.     Mental Status: She is alert and oriented to person, place, and time.     Motor: Weakness and atrophy present.     Deep Tendon Reflexes: Reflexes abnormal.     Reflex Scores:      Tricep reflexes are 2+ on the right side and 0 on the left side.       Bicep reflexes are 2+ on the right side and 0 on the left side.      Brachioradialis reflexes are 2+ on the right side and 0 on the left side.      Patellar reflexes are 1+ on the right side.      Achilles reflexes are 1+ on the right side.    Comments: Left  lower extremity above-the-knee amputation. Pt has left hemiparesis. Unable to move left upper extremity or left stump unassisted. Pt non-ambulatory. Pt requires maximum assistance for mobility.   Psychiatric:        Mood and Affect: Mood normal.        Behavior: Behavior normal.     BP 136/64   Pulse 72   Temp 97.7 F (36.5 C)   Resp 16   Ht 5\' 2"  (1.575 m)   Wt 190 lb (86.2 kg)   BMI 34.75 kg/m  Wt Readings from Last 3 Encounters:  01/04/21 190 lb (86.2 kg)  08/19/20 176 lb (79.8 kg)  06/12/18 158 lb 7 oz (71.9 kg)     Health Maintenance Due  Topic Date Due  . TETANUS/TDAP  Never done  . PNA vac Low Risk Adult (2 of 2 - PPSV23) 07/31/2017    Lab Results  Component Value Date   TSH 2.590 11/24/2020   Lab Results  Component Value Date   WBC 8.5 11/24/2020   HGB 11.1 11/24/2020   HCT 34.5 11/24/2020   MCV 79 11/24/2020   PLT 452 (H) 11/24/2020   Lab Results  Component Value Date   NA 139 12/14/2020   K 5.2 12/14/2020   CO2 20 12/14/2020   GLUCOSE 98 12/14/2020   BUN 16 12/14/2020   CREATININE 0.88 12/14/2020   BILITOT <0.2 12/14/2020   ALKPHOS 97 12/14/2020   AST 25 12/14/2020   ALT 24 12/14/2020   PROT 7.2 12/14/2020   ALBUMIN 4.2 12/14/2020   CALCIUM 9.2 12/14/2020   ANIONGAP 10 06/12/2018   Lab Results  Component Value Date   CHOL 125 11/24/2020   Lab Results  Component Value Date   HDL 42 11/24/2020   Lab Results  Component Value Date   LDLCALC 59 11/24/2020   Lab Results  Component Value Date   TRIG 137 11/24/2020   Lab Results  Component Value Date   CHOLHDL 3.0 11/24/2020   No results found for: HGBA1C    Assessment & Plan:    1. Mobility impaired -Powered  wheel-chair  2. Unilateral AKA, left ([place])  3. Left hemiplegia ([place])  4. History of CVA with residual deficit  5. PVD (peripheral vascular disease) ([place])  6. Ischemic neuropathy of left foot      Follow-up: 03/01/2021    Rip Harbour, NP

## 2021-01-05 ENCOUNTER — Ambulatory Visit (INDEPENDENT_AMBULATORY_CARE_PROVIDER_SITE_OTHER): Payer: Medicare Other

## 2021-01-05 DIAGNOSIS — E538 Deficiency of other specified B group vitamins: Secondary | ICD-10-CM | POA: Diagnosis not present

## 2021-01-05 MED ORDER — CYANOCOBALAMIN 1000 MCG/ML IJ SOLN
1000.0000 ug | Freq: Once | INTRAMUSCULAR | Status: AC
  Administered 2021-01-05: 1000 ug via INTRAMUSCULAR

## 2021-01-07 ENCOUNTER — Other Ambulatory Visit: Payer: Self-pay

## 2021-01-07 MED ORDER — PROMETHAZINE HCL 25 MG PO TABS
25.0000 mg | ORAL_TABLET | Freq: Three times a day (TID) | ORAL | 0 refills | Status: DC | PRN
Start: 2021-01-07 — End: 2021-02-10

## 2021-01-07 MED ORDER — BUTALBITAL-APAP-CAFFEINE 50-325-40 MG PO TABS: 1.0000 | ORAL_TABLET | Freq: Four times a day (QID) | ORAL | 2 refills | Status: DC | PRN

## 2021-01-14 ENCOUNTER — Other Ambulatory Visit: Payer: Self-pay

## 2021-01-14 DIAGNOSIS — E782 Mixed hyperlipidemia: Secondary | ICD-10-CM

## 2021-01-14 MED ORDER — SIMVASTATIN 40 MG PO TABS: ORAL_TABLET | ORAL | 0 refills | Status: DC

## 2021-01-23 ENCOUNTER — Other Ambulatory Visit: Payer: Self-pay | Admitting: Family Medicine

## 2021-02-04 ENCOUNTER — Ambulatory Visit: Payer: Medicare Other

## 2021-02-09 ENCOUNTER — Ambulatory Visit (INDEPENDENT_AMBULATORY_CARE_PROVIDER_SITE_OTHER): Payer: Medicare Other

## 2021-02-09 ENCOUNTER — Other Ambulatory Visit: Payer: Self-pay

## 2021-02-09 DIAGNOSIS — E538 Deficiency of other specified B group vitamins: Secondary | ICD-10-CM

## 2021-02-09 MED ORDER — CYANOCOBALAMIN 1000 MCG/ML IJ SOLN
1000.0000 ug | Freq: Once | INTRAMUSCULAR | Status: AC
  Administered 2021-02-09: 1000 ug via INTRAMUSCULAR

## 2021-02-09 NOTE — Progress Notes (Signed)
Pt came in office for B12 injection. Pt tolerated injection in R deltoid.   Royce Macadamia, [place] 02/09/21 2:26 PM

## 2021-02-10 ENCOUNTER — Other Ambulatory Visit: Payer: Self-pay | Admitting: Nurse Practitioner

## 2021-02-18 ENCOUNTER — Other Ambulatory Visit: Payer: Self-pay | Admitting: Family Medicine

## 2021-02-18 DIAGNOSIS — I69354 Hemiplegia and hemiparesis following cerebral infarction affecting left non-dominant side: Secondary | ICD-10-CM | POA: Diagnosis not present

## 2021-02-18 DIAGNOSIS — I10 Essential (primary) hypertension: Secondary | ICD-10-CM

## 2021-02-18 DIAGNOSIS — I739 Peripheral vascular disease, unspecified: Secondary | ICD-10-CM | POA: Diagnosis not present

## 2021-02-18 DIAGNOSIS — G5792 Unspecified mononeuropathy of left lower limb: Secondary | ICD-10-CM | POA: Diagnosis not present

## 2021-02-18 DIAGNOSIS — S78112A Complete traumatic amputation at level between left hip and knee, initial encounter: Secondary | ICD-10-CM | POA: Diagnosis not present

## 2021-02-18 DIAGNOSIS — E782 Mixed hyperlipidemia: Secondary | ICD-10-CM

## 2021-02-18 DIAGNOSIS — I69898 Other sequelae of other cerebrovascular disease: Secondary | ICD-10-CM | POA: Diagnosis not present

## 2021-03-01 ENCOUNTER — Ambulatory Visit (INDEPENDENT_AMBULATORY_CARE_PROVIDER_SITE_OTHER): Payer: Medicare Other | Admitting: Nurse Practitioner

## 2021-03-01 ENCOUNTER — Other Ambulatory Visit: Payer: Self-pay

## 2021-03-01 ENCOUNTER — Encounter: Payer: Self-pay | Admitting: Nurse Practitioner

## 2021-03-01 VITALS — BP 122/72 | HR 68 | Temp 97.5°F | Ht 62.0 in | Wt 192.0 lb

## 2021-03-01 DIAGNOSIS — I10 Essential (primary) hypertension: Secondary | ICD-10-CM | POA: Diagnosis not present

## 2021-03-01 DIAGNOSIS — E538 Deficiency of other specified B group vitamins: Secondary | ICD-10-CM | POA: Diagnosis not present

## 2021-03-01 DIAGNOSIS — E782 Mixed hyperlipidemia: Secondary | ICD-10-CM | POA: Diagnosis not present

## 2021-03-01 DIAGNOSIS — I739 Peripheral vascular disease, unspecified: Secondary | ICD-10-CM

## 2021-03-01 DIAGNOSIS — D6869 Other thrombophilia: Secondary | ICD-10-CM

## 2021-03-01 DIAGNOSIS — E038 Other specified hypothyroidism: Secondary | ICD-10-CM | POA: Diagnosis not present

## 2021-03-01 MED ORDER — LISINOPRIL 20 MG PO TABS: 20.0000 mg | ORAL_TABLET | Freq: Every day | ORAL | 3 refills | Status: DC

## 2021-03-01 MED ORDER — LISINOPRIL 20 MG PO TABS: 1.0000 | ORAL_TABLET | Freq: Every day | ORAL | 1 refills | Status: DC

## 2021-03-01 MED ORDER — CYANOCOBALAMIN 1000 MCG/ML IJ SOLN: 1000.0000 ug | Freq: Once | INTRAMUSCULAR | 1 refills | Status: AC

## 2021-03-01 MED ORDER — CYANOCOBALAMIN 1000 MCG/ML IJ SOLN
1000.0000 ug | Freq: Once | INTRAMUSCULAR | Status: AC
  Administered 2021-03-01: 1000 ug via INTRAMUSCULAR

## 2021-03-01 NOTE — Progress Notes (Signed)
Subjective:  Patient ID: Anna Hayes, female    DOB: [birth year]  Age: 70 y.o. MRN: [id number]  Chief Complaint  Patient presents with  . Hypertension  . Hyperlipidemia    HPI  Anna Hayes is a pleasant 70 year old African-American female that presents for follow-up of hypertension and hyperlipidemia. She tells me that she recently received her electric powered wheel-chair last week. She is now trying to obtain a [place] lift for her home to assist with her mobility needs.   Hypertension She was last seen for hypertension 3 months ago.  BP at that visit was 132/78. Management since that visit includes Atenolol 50 mg daily, Lisinopril 20 mg daily.  She reports excellent compliance with treatment. She is not having side effects.  She is following a Low Sodium diet. She is not exercising.Physical limitations due to left BKA and left hemiparesis s/p CVA. She does not smoke.  Use of agents associated with hypertension: none.   Outside blood pressures are not being checked. Symptoms: No chest pain No chest pressure  No palpitations No syncope  No dyspnea No orthopnea  No paroxysmal nocturnal dyspnea No lower extremity edema   Pertinent labs: Lab Results  Component Value Date   CHOL 125 11/24/2020   HDL 42 11/24/2020   LDLCALC 59 11/24/2020   TRIG 137 11/24/2020   CHOLHDL 3.0 11/24/2020   Lab Results  Component Value Date   NA 139 12/14/2020   K 5.2 12/14/2020   CREATININE 0.88 12/14/2020   GFRNONAA 60 08/19/2020   GFRAA 70 08/19/2020   GLUCOSE 98 12/14/2020     The ASCVD Risk score (Goff DC Jr., et al., 2013) failed to calculate for the following reasons:   The patient has a prior MI or stroke diagnosis  Lipid/Cholesterol, Follow-up  Last lipid panel Other pertinent labs  Lab Results  Component Value Date   CHOL 125 11/24/2020   HDL 42 11/24/2020   LDLCALC 59 11/24/2020   TRIG 137 11/24/2020   CHOLHDL 3.0 11/24/2020   Lab Results  Component Value Date   ALT  24 12/14/2020   AST 25 12/14/2020   PLT 452 (H) 11/24/2020   TSH 2.590 11/24/2020     She was last seen for this 3 months ago.  Management since that visit includes Zocor 40 mg.  She reports excellent compliance with treatment. She is not having side effects.   Symptoms: No chest pain No chest pressure/discomfort  No dyspnea No lower extremity edema  No numbness or tingling of extremity No orthopnea  No palpitations No paroxysmal nocturnal dyspnea  No speech difficulty No syncope   Current diet: in general, a "healthy" diet   Current exercise: none  The ASCVD Risk score ([place]., et al., 2013) failed to calculate for the following reasons:   The patient has a prior MI or stroke diagnosis  Current Outpatient Medications on File Prior to Visit  Medication Sig Dispense Refill  . atenolol (TENORMIN) 50 MG tablet TAKE 1/2 TABLET BY MOUTH EVERY DAY (Patient taking differently: Take 50 mg by mouth daily.) 45 tablet 1  . butalbital-acetaminophen-caffeine (FIORICET) 50-325-40 MG tablet Take 1 tablet by mouth every 6 (six) hours as needed for migraine. 30 tablet 2  . furosemide (LASIX) 20 MG tablet Take 20 mg by mouth daily as needed for fluid.     Marland Kitchen levothyroxine (SYNTHROID) 25 MCG tablet Take 1 tablet (25 mcg total) by mouth daily. 90 tablet 0  . LINZESS 145 MCG CAPS capsule  TAKE 1 CAPSULE BY MOUTH DAILY BEFORE BREAKFAST. 90 capsule 1  . lisinopril (ZESTRIL) 20 MG tablet TAKE 1 TABLET BY MOUTH EVERY DAY AS DIRECTED 90 tablet 1  . omega-3 acid ethyl esters (LOVAZA) 1 g capsule TAKE 2 CAPSULES BY MOUTH 2 TIMES DAILY. 360 capsule 1  . omeprazole (PRILOSEC) 40 MG capsule TAKE 1 CAPSULE BY MOUTH EVERY DAY 90 capsule 1  . promethazine (PHENERGAN) 25 MG tablet TAKE 1 TABLET BY MOUTH EVERY 8 HOURS AS NEEDED FOR NAUSEA OR VOMITING. 60 tablet 0  . simvastatin (ZOCOR) 40 MG tablet TAKE 1 TABLET BY MOUTH EVERYDAY AT BEDTIME 90 tablet 0  . XARELTO 20 MG TABS tablet TAKE 1 TABLET (20 MG TOTAL) BY  MOUTH DAILY WITH SUPPER. 90 tablet 1   No current facility-administered medications on file prior to visit.   Past Medical History:  Diagnosis Date  . Anemia   . Constipation   . DVT of lower extremity (deep venous thrombosis) ([place])   . Frequent headaches   . Heart murmur    found during pregnancy -  no problems  . Hypertension   . Muscle pain   . Palpitations   . Septic thrombophlebitis 08/31/2017  . Sinus problem   . Stroke [facility])    Past Surgical History:  Procedure Laterality Date  . AMPUTATION Left 07/05/2017   Procedure: LEFT ABOVE KNEE AMPUTATION;  Surgeon: Conrad , MD;  Location: [place];  Service: Vascular;  Laterality: Left;  . CATARACT EXTRACTION W/PHACO Right 06/12/2018   Procedure: CATARACT EXTRACTION WITH INTRAOCULAR LENS IMPLANT;  Surgeon: Jalene Mullet, MD;  Location: [place];  Service: Ophthalmology;  Laterality: Right;  . COLONOSCOPY    . EMBOLECTOMY Bilateral 06/27/2017   Procedure: BILATERAL FEMORAL POPLITEAL EMBOLECTOMY;  Surgeon: Serafina Mitchell, MD;  [facility];  Service: Vascular;  Laterality: Bilateral;  . FASCIOTOMY Left 06/27/2017   Procedure: FULL COMPARTMENT LEFT LOWER LEG FASCIOTOMY;  Surgeon: Serafina Mitchell, MD;  Location: [place];  Service: Vascular;  Laterality: Left;  . PARTIAL HYSTERECTOMY    . PATCH ANGIOPLASTY Left 06/27/2017   Procedure: PATCH ANGIOPLASTY Left Posterior Tibial Artery;  Surgeon: Serafina Mitchell, MD;  Location: [place];  Service: Vascular;  Laterality: Left;  . 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 07/11/2017   Procedure: REPAIR OF COMPLEX TRACTION RETINAL DETACHMENT WITH ENDO LASER AND ANTIFUNGAL INJECTION;  Surgeon: Jalene Mullet, MD;  Location: [place];  Service: Ophthalmology;  Laterality: Right;  . TEE WITHOUT CARDIOVERSION N/A 07/11/2017   Procedure: TRANSESOPHAGEAL ECHOCARDIOGRAM (TEE);  Surgeon: Acie Fredrickson Wonda Cheng, MD;  Location: [facility] ENDOSCOPY;  Service: Cardiovascular;  Laterality: N/A;    Family History   Problem Relation Age of Onset  . Breast cancer Mother   . Prostate cancer Father   . Prostate cancer Brother    Social History   Socioeconomic History  . Marital status: Widowed    Spouse name: Not on file  . Number of children: Not on file  . Years of education: Not on file  . Highest education level: Not on file  Occupational History  . Not on file  Tobacco Use  . Smoking status: Current Some Day Smoker    Packs/day: 0.25    Years: 53.00    Pack years: 13.25    Types: Cigarettes  . Smokeless tobacco: Never Used  Vaping Use  . Vaping Use: Never used  Substance and Sexual Activity  . Alcohol use: No  . Drug use: No  .  Sexual activity: Not Currently  Other Topics Concern  . Not on file  Social History Narrative  . Not on file   Social Determinants of Health   Financial Resource Strain: Not on file  Food Insecurity: Not on file  Transportation Needs: Not on file  Physical Activity: Not on file  Stress: Not on file  Social Connections: Not on file    Review of Systems  Constitutional: Negative for fatigue and fever.  HENT: Negative for congestion, ear pain, sinus pressure and sore throat.   Eyes: Negative for pain.  Respiratory: Negative for cough, chest tightness, shortness of breath and wheezing.   Cardiovascular: Negative for chest pain and palpitations.  Gastrointestinal: Negative for abdominal pain, constipation, diarrhea, nausea and vomiting.  Genitourinary: Negative for dysuria and hematuria.  Musculoskeletal: Negative for arthralgias, back pain, joint swelling and myalgias.  Skin: Negative for rash.  Neurological: Negative for dizziness, weakness and headaches.  Psychiatric/Behavioral: Negative for dysphoric mood. The patient is not nervous/anxious.      Objective:  BP 122/72 (BP Location: Left Arm, Patient Position: Sitting)   Pulse 68   Temp (!) 97.5 F (36.4 C) (Temporal)   Ht 5\' 2"  (1.575 m)   Wt 192 lb (87.1 kg)   SpO2 99%   BMI 35.12  kg/m   BP/Weight 03/01/2021 0/93/8182 06/05/3715  Systolic BP 967 893 810  Diastolic BP 72 64 68  Wt. (Lbs) 192 190 -  BMI 35.12 34.75 32.19    Physical Exam Constitutional:      Appearance: Normal appearance.  HENT:     Head: Normocephalic.     Right Ear: Tympanic membrane normal.     Left Ear: Tympanic membrane normal.     Mouth/Throat:     Mouth: Mucous membranes are dry.  Cardiovascular:     Rate and Rhythm: Normal rate and regular rhythm.     Pulses: Normal pulses.     Heart sounds: Normal heart sounds.  Pulmonary:     Effort: Pulmonary effort is normal.     Breath sounds: Normal breath sounds.  Abdominal:     General: Bowel sounds are normal.     Palpations: Abdomen is soft.  Musculoskeletal:     Cervical back: Normal range of motion.     Left Lower Extremity: Left leg is amputated above knee.  Skin:    General: Skin is warm and dry.     Capillary Refill: Capillary refill takes less than 2 seconds.  Neurological:     General: No focal deficit present.     Mental Status: She is alert and oriented to person, place, and time.  Psychiatric:        Mood and Affect: Mood normal.        Behavior: Behavior normal.        Thought Content: Thought content normal.        Judgment: Judgment normal.     Diabetic Foot Exam - Simple   No data filed      Lab Results  Component Value Date   WBC 8.5 11/24/2020   HGB 11.1 11/24/2020   HCT 34.5 11/24/2020   PLT 452 (H) 11/24/2020   GLUCOSE 98 12/14/2020   CHOL 125 11/24/2020   TRIG 137 11/24/2020   HDL 42 11/24/2020   LDLCALC 59 11/24/2020   ALT 24 12/14/2020   AST 25 12/14/2020   NA 139 12/14/2020   K 5.2 12/14/2020   CL 103 12/14/2020   CREATININE 0.88 12/14/2020  BUN 16 12/14/2020   CO2 20 12/14/2020   TSH 2.590 11/24/2020   INR 1.21 06/12/2018      Assessment & Plan:   1. Benign essential HTN-well controlled -continue Atenolol 50 mg daily  -continue Lisinopril 20 mg daily  2. Mixed  hyperlipidemia-well-controlled -heart healthy diet -continue Zocor 40 mg daily  3. Secondary hypothyroidism -continue Levothyroxine 25 mcg daily  4. Acquired thrombophilia ([place]) -fall precautions  5. PVD (peripheral vascular disease) ([place])  -continue Xarelto 20 mg   Continue medications We will call you with lab results Follow-up fasting 3 months      Follow-up: 64-months  An After Visit Summary was printed and given to the patient.    I , Fonnie Mu as a scribe for Rip Harbour, NP.,have documented all relevant documentation on the behalf of Rip Harbour, NP,as directed by  Rip Harbour, NP while in the presence of Rip Harbour, NP.  Rip Harbour, NP [place] [phone]

## 2021-03-01 NOTE — Patient Instructions (Addendum)
Continue medications We will call you with lab results Follow-up fasting 3 months  Managing Your Hypertension Hypertension, also called high blood pressure, is when the force of the blood pressing against the walls of the arteries is too strong. Arteries are blood vessels that carry blood from your heart throughout your body. Hypertension forces the heart to work harder to pump blood and may cause the arteries to become narrow or stiff. Understanding blood pressure readings Your personal target blood pressure may vary depending on your medical conditions, your age, and other factors. A blood pressure reading includes a higher number over a lower number. Ideally, your blood pressure should be below 120/80. You should know that:  The first, or top, number is called the systolic pressure. It is a measure of the pressure in your arteries as your heart beats.  The second, or bottom number, is called the diastolic pressure. It is a measure of the pressure in your arteries as the heart relaxes. Blood pressure is classified into four stages. Based on your blood pressure reading, your health care provider may use the following stages to determine what type of treatment you need, if any. Systolic pressure and diastolic pressure are measured in a unit called mmHg. Normal  Systolic pressure: below 676.  Diastolic pressure: below 80. Elevated  Systolic pressure: 195-093.  Diastolic pressure: below 80. Hypertension stage 1  Systolic pressure: 267-124.  Diastolic pressure: 58-09. Hypertension stage 2  Systolic pressure: 983 or above.  Diastolic pressure: 90 or above. How can this condition affect me? Managing your hypertension is an important responsibility. Over time, hypertension can damage the arteries and decrease blood flow to important parts of the body, including the brain, heart, and kidneys. Having untreated or uncontrolled hypertension can lead to:  A heart attack.  A stroke.  A  weakened blood vessel (aneurysm).  Heart failure.  Kidney damage.  Eye damage.  Metabolic syndrome.  Memory and concentration problems.  Vascular dementia. What actions can I take to manage this condition? Hypertension can be managed by making lifestyle changes and possibly by taking medicines. Your health care provider will help you make a plan to bring your blood pressure within a normal range. Nutrition  Eat a diet that is high in fiber and potassium, and low in salt (sodium), added sugar, and fat. An example eating plan is called the Dietary Approaches to Stop Hypertension (DASH) diet. To eat this way: ? Eat plenty of fresh fruits and vegetables. Try to fill one-half of your plate at each meal with fruits and vegetables. ? Eat whole grains, such as whole-wheat pasta, brown rice, or whole-grain bread. Fill about one-fourth of your plate with whole grains. ? Eat low-fat dairy products. ? Avoid fatty cuts of meat, processed or cured meats, and poultry with skin. Fill about one-fourth of your plate with lean proteins such as fish, chicken without skin, beans, eggs, and tofu. ? Avoid pre-made and processed foods. These tend to be higher in sodium, added sugar, and fat.  Reduce your daily sodium intake. Most people with hypertension should eat less than 1,500 mg of sodium a day.   Lifestyle  Work with your health care provider to maintain a healthy body weight or to lose weight. Ask what an ideal weight is for you.  Get at least 30 minutes of exercise that causes your heart to beat faster (aerobic exercise) most days of the week. Activities may include walking, swimming, or biking.  Include exercise to strengthen your muscles (  resistance exercise), such as weight lifting, as part of your weekly exercise routine. Try to do these types of exercises for 30 minutes at least 3 days a week.  Do not use any products that contain nicotine or tobacco, such as cigarettes, e-cigarettes, and  chewing tobacco. If you need help quitting, ask your health care provider.  Control any long-term (chronic) conditions you have, such as high cholesterol or diabetes.  Identify your sources of stress and find ways to manage stress. This may include meditation, deep breathing, or making time for fun activities.   Alcohol use  Do not drink alcohol if: ? Your health care provider tells you not to drink. ? You are pregnant, may be pregnant, or are planning to become pregnant.  If you drink alcohol: ? Limit how much you use to:  0-1 drink a day for women.  0-2 drinks a day for men. ? Be aware of how much alcohol is in your drink. In the [place] one drink equals one 12 oz bottle of beer (355 mL), one 5 oz glass of wine (148 mL), or one 1 oz glass of hard liquor (44 mL). Medicines Your health care provider may prescribe medicine if lifestyle changes are not enough to get your blood pressure under control and if:  Your systolic blood pressure is 130 or higher.  Your diastolic blood pressure is 80 or higher. Take medicines only as told by your health care provider. Follow the directions carefully. Blood pressure medicines must be taken as told by your health care provider. The medicine does not work as well when you skip doses. Skipping doses also puts you at risk for problems. Monitoring Before you monitor your blood pressure:  Do not smoke, drink caffeinated beverages, or exercise within 30 minutes before taking a measurement.  Use the bathroom and empty your bladder (urinate).  Sit quietly for at least 5 minutes before taking measurements. Monitor your blood pressure at home as told by your health care provider. To do this:  Sit with your back straight and supported.  Place your feet flat on the floor. Do not cross your legs.  Support your arm on a flat surface, such as a table. Make sure your upper arm is at heart level.  Each time you measure, take two or three readings one minute  apart and record the results. You may also need to have your blood pressure checked regularly by your health care provider.   General information  Talk with your health care provider about your diet, exercise habits, and other lifestyle factors that may be contributing to hypertension.  Review all the medicines you take with your health care provider because there may be side effects or interactions.  Keep all visits as told by your health care provider. Your health care provider can help you create and adjust your plan for managing your high blood pressure. Where to find more information  National Heart, Lung, and Blood Institute: https://wilson-eaton.com/  American Heart Association: www.heart.org Contact a health care provider if:  You think you are having a reaction to medicines you have taken.  You have repeated (recurrent) headaches.  You feel dizzy.  You have swelling in your ankles.  You have trouble with your vision. Get help right away if:  You develop a severe headache or confusion.  You have unusual weakness or numbness, or you feel faint.  You have severe pain in your chest or abdomen.  You vomit repeatedly.  You have trouble breathing.  These symptoms may represent a serious problem that is an emergency. Do not wait to see if the symptoms will go away. Get medical help right away. Call your local emergency services (911 in the [place] Do not drive yourself to the hospital. Summary  Hypertension is when the force of blood pumping through your arteries is too strong. If this condition is not controlled, it may put you at risk for serious complications.  Your personal target blood pressure may vary depending on your medical conditions, your age, and other factors. For most people, a normal blood pressure is less than 120/80.  Hypertension is managed by lifestyle changes, medicines, or both.  Lifestyle changes to help manage hypertension include losing weight, eating a  healthy, low-sodium diet, exercising more, stopping smoking, and limiting alcohol. This information is not intended to replace advice given to you by your health care provider. Make sure you discuss any questions you have with your health care provider. Document Revised: 10/18/2019 Document Reviewed: 08/13/2019 Elsevier Patient Education  2021 [place] Prevention in the Home, Adult Falls can cause injuries and can happen to people of all ages. There are many things you can do to make your home safe and to help prevent falls. Ask for help when making these changes. What actions can I take to prevent falls? General Instructions  Use good lighting in all rooms. Replace any light bulbs that burn out.  Turn on the lights in dark areas. Use night-lights.  Keep items that you use often in easy-to-reach places. Lower the shelves around your home if needed.  Set up your furniture so you have a clear path. Avoid moving your furniture around.  Do not have throw rugs or other things on the floor that can make you trip.  Avoid walking on wet floors.  If any of your floors are uneven, fix them.  Add color or contrast paint or tape to clearly mark and help you see: ? Grab bars or handrails. ? First and last steps of staircases. ? Where the edge of each step is.  If you use a stepladder: ? Make sure that it is fully opened. Do not climb a closed stepladder. ? Make sure the sides of the stepladder are locked in place. ? Ask someone to hold the stepladder while you use it.  Know where your pets are when moving through your home. What can I do in the bathroom?  Keep the floor dry. Clean up any water on the floor right away.  Remove soap buildup in the tub or shower.  Use nonskid mats or decals on the floor of the tub or shower.  Attach bath mats securely with double-sided, nonslip rug tape.  If you need to sit down in the shower, use a plastic, nonslip stool.  Install grab  bars by the toilet and in the tub and shower. Do not use towel bars as grab bars.      What can I do in the bedroom?  Make sure that you have a light by your bed that is easy to reach.  Do not use any sheets or blankets for your bed that hang to the floor.  Have a firm chair with side arms that you can use for support when you get dressed. What can I do in the kitchen?  Clean up any spills right away.  If you need to reach something above you, use a step stool with a grab bar.  Keep electrical cords out  of the way.  Do not use floor polish or wax that makes floors slippery. What can I do with my stairs?  Do not leave any items on the stairs.  Make sure that you have a light switch at the top and the bottom of the stairs.  Make sure that there are handrails on both sides of the stairs. Fix handrails that are broken or loose.  Install nonslip stair treads on all your stairs.  Avoid having throw rugs at the top or bottom of the stairs.  Choose a carpet that does not hide the edge of the steps on the stairs.  Check carpeting to make sure that it is firmly attached to the stairs. Fix carpet that is loose or worn. What can I do on the outside of my home?  Use bright outdoor lighting.  Fix the edges of walkways and driveways and fix any cracks.  Remove anything that might make you trip as you walk through a door, such as a raised step or threshold.  Trim any bushes or trees on paths to your home.  Check to see if handrails are loose or broken and that both sides of all steps have handrails.  Install guardrails along the edges of any raised decks and porches.  Clear paths of anything that can make you trip, such as tools or rocks.  Have leaves, snow, or ice cleared regularly.  Use sand or salt on paths during winter.  Clean up any spills in your garage right away. This includes grease or oil spills. What other actions can I take?  Wear shoes that: ? Have a low heel.  Do not wear high heels. ? Have rubber bottoms. ? Feel good on your feet and fit well. ? Are closed at the toe. Do not wear open-toe sandals.  Use tools that help you move around if needed. These include: ? Canes. ? Walkers. ? Scooters. ? Crutches.  Review your medicines with your doctor. Some medicines can make you feel dizzy. This can increase your chance of falling. Ask your doctor what else you can do to help prevent falls. Where to find more information  Centers for Disease Control and Prevention, STEAD[employer]: http://www.wolf.info/  National Institute on Aging: http://kim-miller.com/ Contact a doctor if:  You are afraid of falling at home.  You feel weak, drowsy, or dizzy at home.  You fall at home. Summary  There are many simple things that you can do to make your home safe and to help prevent falls.  Ways to make your home safe include removing things that can make you trip and installing grab bars in the bathroom.  Ask for help when making these changes in your home. This information is not intended to replace advice given to you by your health care provider. Make sure you discuss any questions you have with your health care provider. Document Revised: 04/15/2020 Document Reviewed: 04/15/2020 Elsevier Patient Education  [place].

## 2021-03-02 LAB — CBC WITH DIFF/PLATELET
Basophils Absolute: 0 10*3/uL (ref 0.0–0.2)
Basos: 1 %
EOS (ABSOLUTE): 0.3 10*3/uL (ref 0.0–0.4)
Eos: 3 %
Hematocrit: 36.6 % (ref 34.0–46.6)
Hemoglobin: 11.7 g/dL (ref 11.1–15.9)
Immature Grans (Abs): 0 10*3/uL (ref 0.0–0.1)
Immature Granulocytes: 0 %
Lymphocytes Absolute: 2.5 10*3/uL (ref 0.7–3.1)
Lymphs: 32 %
MCH: 25.2 pg — ABNORMAL LOW (ref 26.6–33.0)
MCHC: 32 g/dL (ref 31.5–35.7)
MCV: 79 fL (ref 79–97)
Monocytes Absolute: 0.7 10*3/uL (ref 0.1–0.9)
Monocytes: 10 %
Neutrophils Absolute: 4.1 10*3/uL (ref 1.4–7.0)
Neutrophils: 54 %
Platelets: 421 10*3/uL (ref 150–450)
RBC: 4.65 x10E6/uL (ref 3.77–5.28)
RDW: 16.3 % — ABNORMAL HIGH (ref 11.7–15.4)
WBC: 7.6 10*3/uL (ref 3.4–10.8)

## 2021-03-02 LAB — LIPID PANEL
Chol/HDL Ratio: 3.6 ratio (ref 0.0–4.4)
Cholesterol, Total: 157 mg/dL (ref 100–199)
HDL: 44 mg/dL (ref >39)
LDL Chol Calc (NIH): 82 mg/dL (ref 0–99)
Triglycerides: 180 mg/dL — ABNORMAL HIGH (ref 0–149)
VLDL Cholesterol Cal: 31 mg/dL (ref 5–40)

## 2021-03-02 LAB — COMPREHENSIVE METABOLIC PANEL
ALT: 31 IU/L (ref 0–32)
AST: 25 IU/L (ref 0–40)
Albumin/Globulin Ratio: 1.6 (ref 1.2–2.2)
Albumin: 4.5 g/dL (ref 3.7–4.7)
Alkaline Phosphatase: 95 IU/L (ref 44–121)
BUN/Creatinine Ratio: 17 (ref 12–28)
BUN: 13 mg/dL (ref 8–27)
Bilirubin Total: 0.2 mg/dL (ref 0.0–1.2)
CO2: 21 mmol/L (ref 20–29)
Calcium: 9.6 mg/dL (ref 8.7–10.3)
Chloride: 104 mmol/L (ref 96–106)
Creatinine, Ser: 0.78 mg/dL (ref 0.57–1.00)
Globulin, Total: 2.9 g/dL (ref 1.5–4.5)
Glucose: 96 mg/dL (ref 65–99)
Potassium: 5.2 mmol/L (ref 3.5–5.2)
Sodium: 139 mmol/L (ref 134–144)
Total Protein: 7.4 g/dL (ref 6.0–8.5)
eGFR: 81 mL/min/{1.73_m2} (ref >59)

## 2021-03-02 LAB — TSH: TSH: 2.89 u[IU]/mL (ref 0.450–4.500)

## 2021-03-02 LAB — B12 AND FOLATE PANEL
Folate: 15.2 ng/mL (ref >3.0)
Vitamin B-12: >2000 pg/mL — ABNORMAL HIGH (ref 232–1245)

## 2021-03-02 LAB — CARDIOVASCULAR RISK ASSESSMENT

## 2021-03-12 ENCOUNTER — Other Ambulatory Visit: Payer: Self-pay | Admitting: Family Medicine

## 2021-03-18 ENCOUNTER — Other Ambulatory Visit: Payer: Self-pay | Admitting: Family Medicine

## 2021-03-18 DIAGNOSIS — I10 Essential (primary) hypertension: Secondary | ICD-10-CM

## 2021-03-21 DIAGNOSIS — I69354 Hemiplegia and hemiparesis following cerebral infarction affecting left non-dominant side: Secondary | ICD-10-CM | POA: Diagnosis not present

## 2021-03-21 DIAGNOSIS — G5792 Unspecified mononeuropathy of left lower limb: Secondary | ICD-10-CM | POA: Diagnosis not present

## 2021-03-21 DIAGNOSIS — S78112A Complete traumatic amputation at level between left hip and knee, initial encounter: Secondary | ICD-10-CM | POA: Diagnosis not present

## 2021-03-21 DIAGNOSIS — I69898 Other sequelae of other cerebrovascular disease: Secondary | ICD-10-CM | POA: Diagnosis not present

## 2021-03-21 DIAGNOSIS — I739 Peripheral vascular disease, unspecified: Secondary | ICD-10-CM | POA: Diagnosis not present

## 2021-03-22 ENCOUNTER — Other Ambulatory Visit: Payer: Self-pay

## 2021-03-22 MED ORDER — PROMETHAZINE HCL 25 MG PO TABS: 25.0000 mg | ORAL_TABLET | Freq: Three times a day (TID) | ORAL | 0 refills | Status: DC | PRN

## 2021-03-25 ENCOUNTER — Ambulatory Visit: Payer: Medicare Other | Admitting: Family Medicine

## 2021-03-30 ENCOUNTER — Other Ambulatory Visit: Payer: Self-pay

## 2021-03-30 MED ORDER — BUTALBITAL-APAP-CAFFEINE 50-325-40 MG PO TABS: 1.0000 | ORAL_TABLET | Freq: Four times a day (QID) | ORAL | 2 refills | Status: DC | PRN

## 2021-03-31 ENCOUNTER — Ambulatory Visit: Payer: Medicare Other

## 2021-04-05 ENCOUNTER — Telehealth: Payer: Self-pay | Admitting: Nurse Practitioner

## 2021-04-05 NOTE — Chronic Care Management (AMB) (Signed)
  Chronic Care Management   Outreach Note  04/05/2021 Name: Anna Hayes MRN: [id number] DOB: Dec 15, 1950  Referred by: Rip Harbour, NP Reason for referral : No chief complaint on file.   An unsuccessful telephone outreach was attempted today. The patient was referred to the pharmacist for assistance with care management and care coordination.   Follow Up Plan:   Tatjana Dellinger Upstream Scheduler

## 2021-04-09 ENCOUNTER — Other Ambulatory Visit: Payer: Self-pay | Admitting: Family Medicine

## 2021-04-09 DIAGNOSIS — E038 Other specified hypothyroidism: Secondary | ICD-10-CM

## 2021-04-13 ENCOUNTER — Other Ambulatory Visit: Payer: Self-pay | Admitting: Family Medicine

## 2021-04-13 DIAGNOSIS — E782 Mixed hyperlipidemia: Secondary | ICD-10-CM

## 2021-04-20 ENCOUNTER — Telehealth: Payer: Self-pay | Admitting: Nurse Practitioner

## 2021-04-20 DIAGNOSIS — S78112A Complete traumatic amputation at level between left hip and knee, initial encounter: Secondary | ICD-10-CM | POA: Diagnosis not present

## 2021-04-20 DIAGNOSIS — G5792 Unspecified mononeuropathy of left lower limb: Secondary | ICD-10-CM | POA: Diagnosis not present

## 2021-04-20 DIAGNOSIS — I69354 Hemiplegia and hemiparesis following cerebral infarction affecting left non-dominant side: Secondary | ICD-10-CM | POA: Diagnosis not present

## 2021-04-20 DIAGNOSIS — I739 Peripheral vascular disease, unspecified: Secondary | ICD-10-CM | POA: Diagnosis not present

## 2021-04-20 DIAGNOSIS — I69898 Other sequelae of other cerebrovascular disease: Secondary | ICD-10-CM | POA: Diagnosis not present

## 2021-04-20 NOTE — Chronic Care Management (AMB) (Signed)
  Chronic Care Management   Note  04/20/2021 Name: Anna Hayes MRN: [id number] DOB: Jan 27, 1951  Anna Hayes is a 70 y.o. year old female who is a primary care patient of Rip Harbour, NP. I reached out to Ulyess Blossom by phone today in response to a referral sent by Ms. Christena Flake Seiber's PCP, Rip Harbour, NP.   [name] was given information about Chronic Care Management services today including:  CCM service includes personalized support from designated clinical staff supervised by her physician, including individualized plan of care and coordination with other care providers 24/7 contact phone numbers for assistance for urgent and routine care needs. Service will only be billed when office clinical staff spend 20 minutes or more in a month to coordinate care. Only one practitioner may furnish and bill the service in a calendar month. The patient may stop CCM services at any time (effective at the end of the month) by phone call to the office staff.   Patient agreed to services and verbal consent obtained.   Follow up plan:   Tatjana Secretary/administrator

## 2021-04-28 ENCOUNTER — Other Ambulatory Visit: Payer: Self-pay

## 2021-04-28 MED ORDER — PROMETHAZINE HCL 25 MG PO TABS: 25.0000 mg | ORAL_TABLET | Freq: Three times a day (TID) | ORAL | 0 refills | Status: DC | PRN

## 2021-05-04 ENCOUNTER — Telehealth: Payer: Self-pay

## 2021-05-04 NOTE — Telephone Encounter (Signed)
Patient called and stated that her pharmacy [facility] on [address] in [place] stated that they are having trouble ordering her Linzess 168mg. So called patient back to inform her per SJerrell Belfast DNP we have put out samples for the patient to come back by and get so get her through until they can get her RX to her for her linzess. Left detailed message informing patient and told her to call [facility] to let [facility] that she had received out message.

## 2021-05-07 ENCOUNTER — Other Ambulatory Visit: Payer: Self-pay

## 2021-05-07 DIAGNOSIS — E038 Other specified hypothyroidism: Secondary | ICD-10-CM

## 2021-05-07 MED ORDER — LEVOTHYROXINE SODIUM 25 MCG PO TABS: 25.0000 ug | ORAL_TABLET | Freq: Every day | ORAL | 2 refills | Status: DC

## 2021-05-21 DIAGNOSIS — S78112A Complete traumatic amputation at level between left hip and knee, initial encounter: Secondary | ICD-10-CM | POA: Diagnosis not present

## 2021-05-21 DIAGNOSIS — I69898 Other sequelae of other cerebrovascular disease: Secondary | ICD-10-CM | POA: Diagnosis not present

## 2021-05-21 DIAGNOSIS — I739 Peripheral vascular disease, unspecified: Secondary | ICD-10-CM | POA: Diagnosis not present

## 2021-05-21 DIAGNOSIS — G5792 Unspecified mononeuropathy of left lower limb: Secondary | ICD-10-CM | POA: Diagnosis not present

## 2021-05-21 DIAGNOSIS — I69354 Hemiplegia and hemiparesis following cerebral infarction affecting left non-dominant side: Secondary | ICD-10-CM | POA: Diagnosis not present

## 2021-06-01 ENCOUNTER — Other Ambulatory Visit: Payer: Self-pay | Admitting: Nurse Practitioner

## 2021-06-02 ENCOUNTER — Encounter: Payer: Self-pay | Admitting: Nurse Practitioner

## 2021-06-02 ENCOUNTER — Other Ambulatory Visit: Payer: Self-pay

## 2021-06-02 ENCOUNTER — Ambulatory Visit (INDEPENDENT_AMBULATORY_CARE_PROVIDER_SITE_OTHER): Payer: Medicare Other | Admitting: Nurse Practitioner

## 2021-06-02 ENCOUNTER — Other Ambulatory Visit: Payer: Self-pay | Admitting: Nurse Practitioner

## 2021-06-02 VITALS — BP 146/78 | HR 70 | Temp 97.4°F | Ht 62.0 in | Wt 192.0 lb

## 2021-06-02 DIAGNOSIS — F17218 Nicotine dependence, cigarettes, with other nicotine-induced disorders: Secondary | ICD-10-CM | POA: Diagnosis not present

## 2021-06-02 DIAGNOSIS — Z23 Encounter for immunization: Secondary | ICD-10-CM | POA: Diagnosis not present

## 2021-06-02 DIAGNOSIS — E538 Deficiency of other specified B group vitamins: Secondary | ICD-10-CM

## 2021-06-02 DIAGNOSIS — D6869 Other thrombophilia: Secondary | ICD-10-CM | POA: Diagnosis not present

## 2021-06-02 DIAGNOSIS — I6930 Unspecified sequelae of cerebral infarction: Secondary | ICD-10-CM

## 2021-06-02 DIAGNOSIS — I10 Essential (primary) hypertension: Secondary | ICD-10-CM | POA: Diagnosis not present

## 2021-06-02 DIAGNOSIS — Z89612 Acquired absence of left leg above knee: Secondary | ICD-10-CM

## 2021-06-02 DIAGNOSIS — R112 Nausea with vomiting, unspecified: Secondary | ICD-10-CM

## 2021-06-02 DIAGNOSIS — G8194 Hemiplegia, unspecified affecting left nondominant side: Secondary | ICD-10-CM | POA: Diagnosis not present

## 2021-06-02 DIAGNOSIS — E782 Mixed hyperlipidemia: Secondary | ICD-10-CM

## 2021-06-02 DIAGNOSIS — E038 Other specified hypothyroidism: Secondary | ICD-10-CM | POA: Diagnosis not present

## 2021-06-02 DIAGNOSIS — I739 Peripheral vascular disease, unspecified: Secondary | ICD-10-CM | POA: Diagnosis not present

## 2021-06-02 MED ORDER — LISINOPRIL 20 MG PO TABS: 20.0000 mg | ORAL_TABLET | Freq: Every day | ORAL | 3 refills | Status: DC

## 2021-06-02 MED ORDER — PROMETHAZINE HCL 25 MG PO TABS: 25.0000 mg | ORAL_TABLET | Freq: Three times a day (TID) | ORAL | 0 refills | Status: DC | PRN

## 2021-06-02 MED ORDER — CYANOCOBALAMIN 1000 MCG/ML IJ SOLN
1000.0000 ug | Freq: Once | INTRAMUSCULAR | Status: AC
Start: 2021-06-02 — End: 2021-06-02
  Administered 2021-06-02: 1000 ug via INTRAMUSCULAR

## 2021-06-02 NOTE — Patient Instructions (Signed)
Continue medications We will call you with lab results Follow-up 81-month  Managing Your Hypertension Hypertension, also called high blood pressure, is when the force of the blood pressing against the walls of the arteries is too strong. Arteries are blood vessels that carry blood from your heart throughout your body. Hypertension forces the heart to work harder to pump blood and may cause the arteries to become narrow or stiff. Understanding blood pressure readings Your personal target blood pressure may vary depending on your medical conditions, your age, and other factors. A blood pressure reading includes a higher number over a lower number. Ideally, your blood pressure should be below 120/80. You should know that: The first, or top, number is called the systolic pressure. It is a measure of the pressure in your arteries as your heart beats. The second, or bottom number, is called the diastolic pressure. It is a measure of the pressure in your arteries as the heart relaxes. Blood pressure is classified into four stages. Based on your blood pressure reading, your health care provider may use the following stages to determine what type of treatment you need, if any. Systolic pressure and diastolic pressure are measured in a unit called mmHg. Normal Systolic pressure: below [id number] Diastolic pressure: below 80. Elevated Systolic pressure: [id number] Diastolic pressure: below 80. Hypertension stage 1 Systolic pressure: 10000000 Diastolic pressure: [id number] Hypertension stage 2 Systolic pressure: [id number] above. Diastolic pressure: 90 or above. How can this condition affect me? Managing your hypertension is an important responsibility. Over time, hypertension can damage the arteries and decrease blood flow to important parts of the body, including the brain, heart, and kidneys. Having untreated or uncontrolled hypertension can lead to: A heart attack. A stroke. A weakened blood vessel  (aneurysm). Heart failure. Kidney damage. Eye damage. Metabolic syndrome. Memory and concentration problems. Vascular dementia. What actions can I take to manage this condition? Hypertension can be managed by making lifestyle changes and possibly by taking medicines. Your health care provider will help you make a plan to bring your blood pressure within a normal range. Nutrition  Eat a diet that is high in fiber and potassium, and low in salt (sodium), added sugar, and fat. An example eating plan is called the Dietary Approaches to Stop Hypertension (DASH) diet. To eat this way: Eat plenty of fresh fruits and vegetables. Try to fill one-half of your plate at each meal with fruits and vegetables. Eat whole grains, such as whole-wheat pasta, brown rice, or whole-grain bread. Fill about one-fourth of your plate with whole grains. Eat low-fat dairy products. Avoid fatty cuts of meat, processed or cured meats, and poultry with skin. Fill about one-fourth of your plate with lean proteins such as fish, chicken without skin, beans, eggs, and tofu. Avoid pre-made and processed foods. These tend to be higher in sodium, added sugar, and fat. Reduce your daily sodium intake. Most people with hypertension should eat less than 1,500 mg of sodium a day. Lifestyle  Work with your health care provider to maintain a healthy body weight or to lose weight. Ask what an ideal weight is for you. Get at least 30 minutes of exercise that causes your heart to beat faster (aerobic exercise) most days of the week. Activities may include walking, swimming, or biking. Include exercise to strengthen your muscles (resistance exercise), such as weight lifting, as part of your weekly exercise routine. Try to do these types of exercises for 30 minutes at least 3 days a week. Do not  use any products that contain nicotine or tobacco, such as cigarettes, e-cigarettes, and chewing tobacco. If you need help quitting, ask your health  care provider. Control any long-term (chronic) conditions you have, such as high cholesterol or diabetes. Identify your sources of stress and find ways to manage stress. This may include meditation, deep breathing, or making time for fun activities. Alcohol use Do not drink alcohol if: Your health care provider tells you not to drink. You are pregnant, may be pregnant, or are planning to become pregnant. If you drink alcohol: Limit how much you use to: 0-1 drink a day for women. 0-2 drinks a day for men. Be aware of how much alcohol is in your drink. In the [place] one drink equals one 12 oz bottle of beer (355 mL), one 5 oz glass of wine (148 mL), or one 1 oz glass of hard liquor (44 mL). Medicines Your health care provider may prescribe medicine if lifestyle changes are not enough to get your blood pressure under control and if: Your systolic blood pressure is 130 or higher. Your diastolic blood pressure is 80 or higher. Take medicines only as told by your health care provider. Follow the directions carefully. Blood pressure medicines must be taken as told by your health care provider. The medicine does not work as well when you skip doses. Skipping doses also puts you at risk for problems. Monitoring Before you monitor your blood pressure: Do not smoke, drink caffeinated beverages, or exercise within 30 minutes before taking a measurement. Use the bathroom and empty your bladder (urinate). Sit quietly for at least 5 minutes before taking measurements. Monitor your blood pressure at home as told by your health care provider. To do this: Sit with your back straight and supported. Place your feet flat on the floor. Do not cross your legs. Support your arm on a flat surface, such as a table. Make sure your upper arm is at heart level. Each time you measure, take two or three readings one minute apart and record the results. You may also need to have your blood pressure checked regularly by  your health care provider. General information Talk with your health care provider about your diet, exercise habits, and other lifestyle factors that may be contributing to hypertension. Review all the medicines you take with your health care provider because there may be side effects or interactions. Keep all visits as told by your health care provider. Your health care provider can help you create and adjust your plan for managing your high blood pressure. Where to find more information National Heart, Lung, and Blood Institute: https://wilson-eaton.com/ American Heart Association: www.heart.org Contact a health care provider if: You think you are having a reaction to medicines you have taken. You have repeated (recurrent) headaches. You feel dizzy. You have swelling in your ankles. You have trouble with your vision. Get help right away if: You develop a severe headache or confusion. You have unusual weakness or numbness, or you feel faint. You have severe pain in your chest or abdomen. You vomit repeatedly. You have trouble breathing. These symptoms may represent a serious problem that is an emergency. Do not wait to see if the symptoms will go away. Get medical help right away. Call your local emergency services (911 in the [place] Do not drive yourself to the hospital. Summary Hypertension is when the force of blood pumping through your arteries is too strong. If this condition is not controlled, it may put you at risk  for serious complications. Your personal target blood pressure may vary depending on your medical conditions, your age, and other factors. For most people, a normal blood pressure is less than 120/80. Hypertension is managed by lifestyle changes, medicines, or both. Lifestyle changes to help manage hypertension include losing weight, eating a healthy, low-sodium diet, exercising more, stopping smoking, and limiting alcohol. This information is not intended to replace advice given  to you by your health care provider. Make sure you discuss any questions you have with your health care provider. Document Revised: 10/18/2019 Document Reviewed: 08/13/2019 Elsevier Patient Education  2022 [place] Eating Plan DASH stands for Dietary Approaches to Stop Hypertension. The DASH eating plan is a healthy eating plan that has been shown to: Reduce high blood pressure (hypertension). Reduce your risk for type 2 diabetes, heart disease, and stroke. Help with weight loss. What are tips for following this plan? Reading food labels Check food labels for the amount of salt (sodium) per serving. Choose foods with less than 5 percent of the Daily Value of sodium. Generally, foods with less than 300 milligrams (mg) of sodium per serving fit into this eating plan. To find whole grains, look for the word "whole" as the first word in the ingredient list. Shopping Buy products labeled as "low-sodium" or "no salt added." Buy fresh foods. Avoid canned foods and pre-made or frozen meals. Cooking Avoid adding salt when cooking. Use salt-free seasonings or herbs instead of table salt or sea salt. Check with your health care provider or pharmacist before using salt substitutes. Do not fry foods. Cook foods using healthy methods such as baking, boiling, grilling, roasting, and broiling instead. Cook with heart-healthy oils, such as olive, canola, avocado, soybean, or sunflower oil. Meal planning  Eat a balanced diet that includes: 4 or more servings of fruits and 4 or more servings of vegetables each day. Try to fill one-half of your plate with fruits and vegetables. 6-8 servings of whole grains each day. Less than 6 oz (170 g) of lean meat, poultry, or fish each day. A 3-oz (85-g) serving of meat is about the same size as a deck of cards. One egg equals 1 oz (28 g). 2-3 servings of low-fat dairy each day. One serving is 1 cup (237 mL). 1 serving of nuts, seeds, or beans 5 times each  week. 2-3 servings of heart-healthy fats. Healthy fats called omega-3 fatty acids are found in foods such as walnuts, flaxseeds, fortified milks, and eggs. These fats are also found in cold-water fish, such as sardines, salmon, and mackerel. Limit how much you eat of: Canned or prepackaged foods. Food that is high in trans fat, such as some fried foods. Food that is high in saturated fat, such as fatty meat. Desserts and other sweets, sugary drinks, and other foods with added sugar. Full-fat dairy products. Do not salt foods before eating. Do not eat more than 4 egg yolks a week. Try to eat at least 2 vegetarian meals a week. Eat more home-cooked food and less restaurant, buffet, and fast food. Lifestyle When eating at a restaurant, ask that your food be prepared with less salt or no salt, if possible. If you drink alcohol: Limit how much you use to: 0-1 drink a day for women who are not pregnant. 0-2 drinks a day for men. Be aware of how much alcohol is in your drink. In the [place] one drink equals one 12 oz bottle of beer (355 mL), one 5 oz glass  of wine (148 mL), or one 1 oz glass of hard liquor (44 mL). General information Avoid eating more than 2,300 mg of salt a day. If you have hypertension, you may need to reduce your sodium intake to 1,500 mg a day. Work with your health care provider to maintain a healthy body weight or to lose weight. Ask what an ideal weight is for you. Get at least 30 minutes of exercise that causes your heart to beat faster (aerobic exercise) most days of the week. Activities may include walking, swimming, or biking. Work with your health care provider or dietitian to adjust your eating plan to your individual calorie needs. What foods should I eat? Fruits All fresh, dried, or frozen fruit. Canned fruit in natural juice (without added sugar). Vegetables Fresh or frozen vegetables (raw, steamed, roasted, or grilled). Low-sodium or reduced-sodium tomato and  vegetable juice. Low-sodium or reduced-sodium tomato sauce and tomato paste. Low-sodium or reduced-sodium canned vegetables. Grains Whole-grain or whole-wheat bread. Whole-grain or whole-wheat pasta. Brown rice. [place]. Bulgur. Whole-grain and low-sodium cereals. Pita bread. Low-fat, low-sodium crackers. Whole-wheat flour tortillas. Meats and other proteins Skinless chicken or [place]. Ground chicken or [place]. Pork with fat trimmed off. Fish and seafood. Egg whites. Dried beans, peas, or lentils. Unsalted nuts, nut butters, and seeds. Unsalted canned beans. Lean cuts of beef with fat trimmed off. Low-sodium, lean precooked or cured meat, such as sausages or meat loaves. Dairy Low-fat (1%) or fat-free (skim) milk. Reduced-fat, low-fat, or fat-free cheeses. Nonfat, low-sodium ricotta or cottage cheese. Low-fat or nonfat yogurt. Low-fat, low-sodium cheese. Fats and oils Soft margarine without trans fats. Vegetable oil. Reduced-fat, low-fat, or light mayonnaise and salad dressings (reduced-sodium). Canola, safflower, olive, avocado, soybean, and sunflower oils. Avocado. Seasonings and condiments Herbs. Spices. Seasoning mixes without salt. Other foods Unsalted popcorn and pretzels. Fat-free sweets. The items listed above may not be a complete list of foods and beverages you can eat. Contact a dietitian for more information. What foods should I avoid? Fruits Canned fruit in a light or heavy syrup. Fried fruit. Fruit in cream or butter sauce. Vegetables Creamed or fried vegetables. Vegetables in a cheese sauce. Regular canned vegetables (not low-sodium or reduced-sodium). Regular canned tomato sauce and paste (not low-sodium or reduced-sodium). Regular tomato and vegetable juice (not low-sodium or reduced-sodium). Angie Fava. Olives. Grains Baked goods made with fat, such as croissants, muffins, or some breads. Dry pasta or rice meal packs. Meats and other proteins Fatty cuts of meat. Ribs.  Fried meat. Berniece Salines. Bologna, salami, and other precooked or cured meats, such as sausages or meat loaves. Fat from the back of a pig (fatback). Bratwurst. Salted nuts and seeds. Canned beans with added salt. Canned or smoked fish. Whole eggs or egg yolks. Chicken or [place] with skin. Dairy Whole or 2% milk, cream, and half-and-half. Whole or full-fat cream cheese. Whole-fat or sweetened yogurt. Full-fat cheese. Nondairy creamers. Whipped toppings. Processed cheese and cheese spreads. Fats and oils Butter. Stick margarine. Lard. Shortening. Ghee. Bacon fat. Tropical oils, such as coconut, palm kernel, or palm oil. Seasonings and condiments Onion salt, garlic salt, seasoned salt, table salt, and sea salt. Worcestershire sauce. Tartar sauce. Barbecue sauce. Teriyaki sauce. Soy sauce, including reduced-sodium. Steak sauce. Canned and packaged gravies. Fish sauce. Oyster sauce. Cocktail sauce. Store-bought horseradish. Ketchup. Mustard. Meat flavorings and tenderizers. Bouillon cubes. Hot sauces. Pre-made or packaged marinades. Pre-made or packaged taco seasonings. Relishes. Regular salad dressings. Other foods Salted popcorn and pretzels. The items listed above may  not be a complete list of foods and beverages you should avoid. Contact a dietitian for more information. Where to find more information National Heart, Lung, and Blood Institute: https://wilson-eaton.com/ American Heart Association: www.heart.org Academy of Nutrition and Dietetics: www.eatright.[place]: www.kidney.org Summary The DASH eating plan is a healthy eating plan that has been shown to reduce high blood pressure (hypertension). It may also reduce your risk for type 2 diabetes, heart disease, and stroke. When on the DASH eating plan, aim to eat more fresh fruits and vegetables, whole grains, lean proteins, low-fat dairy, and heart-healthy fats. With the DASH eating plan, you should limit salt (sodium) intake to 2,300 mg  a day. If you have hypertension, you may need to reduce your sodium intake to 1,500 mg a day. Work with your health care provider or dietitian to adjust your eating plan to your individual calorie needs. This information is not intended to replace advice given to you by your health care provider. Make sure you discuss any questions you have with your health care provider. Document Revised: 08/16/2019 Document Reviewed: 08/16/2019 Elsevier Patient Education  2022 [employer].

## 2021-06-02 NOTE — Progress Notes (Signed)
Subjective:  Patient ID: Anna Hayes, female    DOB: 1951-09-09  Age: 70 y.o. MRN: [id number]  Chief Complaint  Patient presents with   Hypertension   Hyperlipidemia   Hypothyroidism   HPI Anna Hayes is a 70 year old African-American female that presents for follow-up of hypertension, hyperlipidemia, and hypothyroidism. She has a past history of PVD. Current treatment is Xarelto 20 mg daily. She has undergone left AKA. She has experienced a CVA and has left hemiparesis. She lives with her daughter that assists with ADLs and transportation to medical appointments. Bradie continues to smoke cigarettes on some days. She has requested a flu vaccine today. She is up-to-date on screening mammogram and eye exam.   Hypertension, follow-up She was last seen for hypertension 3 months ago.  BP at that visit was 122/72. Management since that visit includes Lisinopril 20 mg and Atenolol 50 mg daily.  She reports good compliance with treatment. She is not having side effects.  She is following a Regular diet. She is not exercising. She does smoke.  Use of agents associated with hypertension: thyroid hormones.   Outside blood pressures are not being checked. Symptoms: No chest pain No chest pressure  No palpitations No syncope  No dyspnea No orthopnea  No paroxysmal nocturnal dyspnea No lower extremity edema   Pertinent labs: Lab Results  Component Value Date   CHOL 157 03/01/2021   HDL 44 03/01/2021   LDLCALC 82 03/01/2021   TRIG 180 (H) 03/01/2021   CHOLHDL 3.6 03/01/2021   Lab Results  Component Value Date   NA 139 03/01/2021   K 5.2 03/01/2021   CREATININE 0.78 03/01/2021   GFRNONAA 60 08/19/2020   GFRAA 70 08/19/2020   GLUCOSE 96 03/01/2021         Lipid/Cholesterol, Follow-up  Last lipid panel Other pertinent labs  Lab Results  Component Value Date   CHOL 157 03/01/2021   HDL 44 03/01/2021   LDLCALC 82 03/01/2021   TRIG 180 (H) 03/01/2021   CHOLHDL 3.6 03/01/2021    Lab Results  Component Value Date   ALT 31 03/01/2021   AST 25 03/01/2021   PLT 421 03/01/2021   TSH 2.890 03/01/2021     She was last seen for this 3 months ago.  Treatment includes Lovaza 1 gm BID and Zocor 40 mg daily  She reports good compliance with treatment. She is not having side effects.   Symptoms: No chest pain No chest pressure/discomfort  No dyspnea No lower extremity edema  No numbness or tingling of extremity No orthopnea  No palpitations No paroxysmal nocturnal dyspnea  No speech difficulty No syncope   Current diet: well balanced Current exercise: none  Hypothyroidism Mechele has a past history of hypothyroidism. Current treatment includes Levothyroxine 25 mcg. Last TSH 2.890 on 03/01/21. She denies any current hypothyroid symptoms.   Current Outpatient Medications on File Prior to Visit  Medication Sig Dispense Refill   atenolol (TENORMIN) 50 MG tablet TAKE 1 TABLET BY MOUTH EVERY DAY 90 tablet 1   butalbital-acetaminophen-caffeine (FIORICET) 50-325-40 MG tablet Take 1 tablet by mouth every 6 (six) hours as needed for migraine. 30 tablet 2   cyanocobalamin (,VITAMIN B-12,) 1000 MCG/ML injection Inject into the muscle.     furosemide (LASIX) 20 MG tablet Take 20 mg by mouth daily as needed for fluid.      levothyroxine (SYNTHROID) 25 MCG tablet Take 1 tablet (25 mcg total) by mouth daily. 90 tablet 2   LINZESS 145  MCG CAPS capsule TAKE 1 CAPSULE BY MOUTH DAILY BEFORE BREAKFAST. 90 capsule 1   lisinopril (ZESTRIL) 20 MG tablet Take 1 tablet (20 mg total) by mouth daily. 90 tablet 3   omega-3 acid ethyl esters (LOVAZA) 1 g capsule TAKE 2 CAPSULES BY MOUTH 2 TIMES DAILY. 360 capsule 1   omeprazole (PRILOSEC) 40 MG capsule TAKE 1 CAPSULE BY MOUTH EVERY DAY 90 capsule 1   promethazine (PHENERGAN) 25 MG tablet TAKE 1 TABLET BY MOUTH EVERY 8 HOURS AS NEEDED. 60 tablet 0   simvastatin (ZOCOR) 40 MG tablet TAKE 1 TABLET BY MOUTH EVERYDAY AT BEDTIME 90 tablet 0    XARELTO 20 MG TABS tablet TAKE 1 TABLET BY MOUTH DAILY WITH SUPPER. 90 tablet 1   No current facility-administered medications on file prior to visit.   Past Medical History:  Diagnosis Date   Anemia    Constipation    DVT of lower extremity (deep venous thrombosis) (HCC)    Frequent headaches    Heart murmur    found during pregnancy -  no problems   Hypertension    Muscle pain    Palpitations    Septic thrombophlebitis 08/31/2017   Sinus problem    Stroke [facility])    Past Surgical History:  Procedure Laterality Date   AMPUTATION Left 07/05/2017   Procedure: LEFT ABOVE KNEE AMPUTATION;  Surgeon: Conrad Patillas, MD;  Location: [place];  Service: Vascular;  Laterality: Left;   CATARACT EXTRACTION W/PHACO Right 06/12/2018   Procedure: CATARACT EXTRACTION WITH INTRAOCULAR LENS IMPLANT;  Surgeon: Jalene Mullet, MD;  Location: [place];  Service: Ophthalmology;  Laterality: Right;   COLONOSCOPY     EMBOLECTOMY Bilateral 06/27/2017   Procedure: BILATERAL FEMORAL POPLITEAL EMBOLECTOMY;  Surgeon: Serafina Mitchell, MD;  Location: [facility];  Service: Vascular;  Laterality: Bilateral;   FASCIOTOMY Left 06/27/2017   Procedure: FULL COMPARTMENT LEFT LOWER LEG FASCIOTOMY;  Surgeon: Serafina Mitchell, MD;  Location: [place];  Service: Vascular;  Laterality: Left;   PARTIAL HYSTERECTOMY     PATCH ANGIOPLASTY Left 06/27/2017   Procedure: PATCH ANGIOPLASTY Left Posterior Tibial Artery;  Surgeon: Serafina Mitchell, MD;  Location: [place];  Service: Vascular;  Laterality: Left;   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 07/11/2017   Procedure: REPAIR OF COMPLEX TRACTION RETINAL DETACHMENT WITH ENDO LASER AND ANTIFUNGAL INJECTION;  Surgeon: Jalene Mullet, MD;  Location: [place];  Service: Ophthalmology;  Laterality: Right;   TEE WITHOUT CARDIOVERSION N/A 07/11/2017   Procedure: TRANSESOPHAGEAL ECHOCARDIOGRAM (TEE);  Surgeon: Acie Fredrickson Wonda Cheng, MD;  Location: [facility] ENDOSCOPY;  Service: Cardiovascular;  Laterality:  N/A;    Family History  Problem Relation Age of Onset   Breast cancer Mother    Prostate cancer Father    Prostate cancer Brother    Social History   Socioeconomic History   Marital status: Widowed    Spouse name: Not on file   Number of children: Not on file   Years of education: Not on file   Highest education level: Not on file  Occupational History   Not on file  Tobacco Use   Smoking status: Some Days    Packs/day: 0.25    Years: 53.00    Pack years: 13.25    Types: Cigarettes   Smokeless tobacco: Never  Vaping Use   Vaping Use: Never used  Substance and Sexual Activity   Alcohol use: No   Drug use: No   Sexual activity: Not Currently  Other  Topics Concern   Not on file  Social History Narrative   Not on file   Social Determinants of Health   Financial Resource Strain: Not on file  Food Insecurity: Not on file  Transportation Needs: Not on file  Physical Activity: Not on file  Stress: Not on file  Social Connections: Not on file    Review of Systems  Constitutional:  Negative for appetite change, fatigue and fever.  HENT:  Negative for congestion, ear pain, sinus pressure and sore throat.   Eyes:  Negative for pain.  Respiratory:  Negative for cough, chest tightness, shortness of breath and wheezing.   Cardiovascular:  Negative for chest pain and palpitations.  Gastrointestinal:  Positive for nausea. Negative for abdominal pain, constipation, diarrhea and vomiting.  Endocrine: Negative.   Genitourinary:  Negative for dysuria and hematuria.  Musculoskeletal:  Negative for arthralgias, back pain, joint swelling and myalgias.  Skin:  Negative for rash.  Allergic/Immunologic: Negative.   Neurological:  Positive for headaches. Negative for dizziness and weakness.  Psychiatric/Behavioral:  Negative for dysphoric mood. The patient is not nervous/anxious.     Objective:  BP (!) 146/78 (BP Location: Right Arm, Patient Position: Sitting)   Pulse 70   Temp  (!) 97.4 F (36.3 C) (Temporal)   Ht '5\' 2"'$  (1.575 m)   Wt 192 lb (87.1 kg)   SpO2 98%   BMI 35.12 kg/m   BP/Weight 06/02/2021 03/01/2021 0000000  Systolic BP 123456 [id number]  Diastolic BP 78 72 64  Wt. (Lbs) 192 192 190  BMI 35.12 35.12 34.75    Physical Exam Vitals reviewed.  Constitutional:      Appearance: Normal appearance.  HENT:     Right Ear: Tympanic membrane normal.     Left Ear: Tympanic membrane normal.     Nose: Nose normal.     Mouth/Throat:     Mouth: Mucous membranes are moist.  Cardiovascular:     Rate and Rhythm: Normal rate and regular rhythm.     Pulses: Normal pulses.     Heart sounds: Normal heart sounds.  Pulmonary:     Effort: Pulmonary effort is normal.     Breath sounds: Normal breath sounds.  Abdominal:     Palpations: Abdomen is soft.  Musculoskeletal:     Cervical back: Normal range of motion.     Comments: Left hand contracted, hemiparesis to left arm     Left Lower Extremity: Left leg is amputated above knee.  Skin:    General: Skin is warm and dry.  Neurological:     Mental Status: She is alert and oriented to person, place, and time.  Psychiatric:        Mood and Affect: Mood normal.        Behavior: Behavior normal.        Thought Content: Thought content normal.        Judgment: Judgment normal.     Lab Results  Component Value Date   WBC 7.6 03/01/2021   HGB 11.7 03/01/2021   HCT 36.6 03/01/2021   PLT 421 03/01/2021   GLUCOSE 96 03/01/2021   CHOL 157 03/01/2021   TRIG 180 (H) 03/01/2021   HDL 44 03/01/2021   LDLCALC 82 03/01/2021   ALT 31 03/01/2021   AST 25 03/01/2021   NA 139 03/01/2021   K 5.2 03/01/2021   CL 104 03/01/2021   CREATININE 0.78 03/01/2021   BUN 13 03/01/2021   CO2 21 03/01/2021   TSH  2.890 03/01/2021   INR 1.21 06/12/2018      Assessment & Plan:   1. Benign essential HTN-not at goal - CBC with Differential/Platelet - Comprehensive metabolic panel -continue Lisinopril 20 mg and Atenolol 50  mg  2. Mixed hyperlipidemia-not at goal - Lipid panel -continue Zocor 40 mg and Lovaza 1 gram  3. Secondary hypothyroidism-well controlled - TSH -Continue Levothyroxine 50 mcg  4. Acquired thrombophilia (HCC)-stable -CBC -Continue Xarelto 20 mg  5. PVD (peripheral vascular disease) (HCC)-well controlled -continue Xarelto 20 mg -recommend smoking cessation  6. B12 deficiency-well controlled - CBC with Differential/Platelet - cyanocobalamin ((VITAMIN B-12)) injection 1,000 mcg  7. Need for vaccination - Flu Vaccine QUAD High Dose(Fluad)  8. Cigarette nicotine dependence with other nicotine-induced disorder  9. History of CVA with residual deficit  10. Left hemiplegia ([place])  11. Hx of AKA (above knee amputation), left (HCC)     Continue medications We will call you with lab results Follow-up 34-month    Follow-up: 399-month An After Visit Summary was printed and given to the patient.   I,Lauren M Auman,acting as a scEducation administratoror ShCIT GroupNP.,have documented all relevant documentation on the behalf of ShRip HarbourNP,as directed by  ShRip HarbourNP while in the presence of ShRip HarbourNP.   I, ShRip HarbourNP, have reviewed all documentation for this visit. The documentation on 06/02/21 for the exam, diagnosis, procedures, and orders are all accurate and complete.   ShRip HarbourNP [phone]

## 2021-06-03 LAB — CBC WITH DIFFERENTIAL/PLATELET
Basophils Absolute: 0.1 10*3/uL (ref 0.0–0.2)
Basos: 1 %
EOS (ABSOLUTE): 0.5 10*3/uL — ABNORMAL HIGH (ref 0.0–0.4)
Eos: 5 %
Hematocrit: 35.2 % (ref 34.0–46.6)
Hemoglobin: 11.4 g/dL (ref 11.1–15.9)
Immature Grans (Abs): 0 10*3/uL (ref 0.0–0.1)
Immature Granulocytes: 0 %
Lymphocytes Absolute: 3.2 10*3/uL — ABNORMAL HIGH (ref 0.7–3.1)
Lymphs: 35 %
MCH: 25.6 pg — ABNORMAL LOW (ref 26.6–33.0)
MCHC: 32.4 g/dL (ref 31.5–35.7)
MCV: 79 fL (ref 79–97)
Monocytes Absolute: 1 10*3/uL — ABNORMAL HIGH (ref 0.1–0.9)
Monocytes: 11 %
Neutrophils Absolute: 4.4 10*3/uL (ref 1.4–7.0)
Neutrophils: 48 %
Platelets: 464 10*3/uL — ABNORMAL HIGH (ref 150–450)
RBC: 4.46 x10E6/uL (ref 3.77–5.28)
RDW: 16.1 % — ABNORMAL HIGH (ref 11.7–15.4)
WBC: 9.1 10*3/uL (ref 3.4–10.8)

## 2021-06-03 LAB — LIPID PANEL
Chol/HDL Ratio: 3.1 ratio (ref 0.0–4.4)
Cholesterol, Total: 134 mg/dL (ref 100–199)
HDL: 43 mg/dL (ref >39)
LDL Chol Calc (NIH): 66 mg/dL (ref 0–99)
Triglycerides: 145 mg/dL (ref 0–149)
VLDL Cholesterol Cal: 25 mg/dL (ref 5–40)

## 2021-06-03 LAB — COMPREHENSIVE METABOLIC PANEL
ALT: 16 IU/L (ref 0–32)
AST: 21 IU/L (ref 0–40)
Albumin/Globulin Ratio: 1.5 (ref 1.2–2.2)
Albumin: 4.2 g/dL (ref 3.7–4.7)
Alkaline Phosphatase: 80 IU/L (ref 44–121)
BUN/Creatinine Ratio: 16 (ref 12–28)
BUN: 16 mg/dL (ref 8–27)
Bilirubin Total: 0.2 mg/dL (ref 0.0–1.2)
CO2: 23 mmol/L (ref 20–29)
Calcium: 10 mg/dL (ref 8.7–10.3)
Chloride: 100 mmol/L (ref 96–106)
Creatinine, Ser: 1 mg/dL (ref 0.57–1.00)
Globulin, Total: 2.8 g/dL (ref 1.5–4.5)
Glucose: 93 mg/dL (ref 65–99)
Potassium: 5.4 mmol/L — ABNORMAL HIGH (ref 3.5–5.2)
Sodium: 139 mmol/L (ref 134–144)
Total Protein: 7 g/dL (ref 6.0–8.5)
eGFR: 60 mL/min/{1.73_m2} (ref >59)

## 2021-06-03 LAB — CARDIOVASCULAR RISK ASSESSMENT

## 2021-06-03 LAB — TSH: TSH: 3.24 u[IU]/mL (ref 0.450–4.500)

## 2021-06-09 ENCOUNTER — Other Ambulatory Visit: Payer: Medicare Other

## 2021-06-09 ENCOUNTER — Other Ambulatory Visit: Payer: Self-pay

## 2021-06-09 DIAGNOSIS — R799 Abnormal finding of blood chemistry, unspecified: Secondary | ICD-10-CM

## 2021-06-10 ENCOUNTER — Other Ambulatory Visit: Payer: Self-pay | Admitting: Family Medicine

## 2021-06-10 ENCOUNTER — Telehealth: Payer: Self-pay

## 2021-06-10 LAB — COMPREHENSIVE METABOLIC PANEL
ALT: 24 IU/L (ref 0–32)
AST: 24 IU/L (ref 0–40)
Albumin/Globulin Ratio: 1.6 (ref 1.2–2.2)
Albumin: 4.1 g/dL (ref 3.7–4.7)
Alkaline Phosphatase: 91 IU/L (ref 44–121)
BUN/Creatinine Ratio: 15 (ref 12–28)
BUN: 14 mg/dL (ref 8–27)
Bilirubin Total: 0.2 mg/dL (ref 0.0–1.2)
CO2: 20 mmol/L (ref 20–29)
Calcium: 9.4 mg/dL (ref 8.7–10.3)
Chloride: 106 mmol/L (ref 96–106)
Creatinine, Ser: 0.92 mg/dL (ref 0.57–1.00)
Globulin, Total: 2.6 g/dL (ref 1.5–4.5)
Glucose: 95 mg/dL (ref 65–99)
Potassium: 5.1 mmol/L (ref 3.5–5.2)
Sodium: 142 mmol/L (ref 134–144)
Total Protein: 6.7 g/dL (ref 6.0–8.5)
eGFR: 66 mL/min/{1.73_m2} (ref >59)

## 2021-06-10 NOTE — Progress Notes (Signed)
Rescheduled pt initial visit for 10/7 @ 2:00pm. Pt confirmed appt

## 2021-06-17 ENCOUNTER — Ambulatory Visit: Payer: Medicare Other

## 2021-06-21 DIAGNOSIS — S78112A Complete traumatic amputation at level between left hip and knee, initial encounter: Secondary | ICD-10-CM | POA: Diagnosis not present

## 2021-06-21 DIAGNOSIS — I69898 Other sequelae of other cerebrovascular disease: Secondary | ICD-10-CM | POA: Diagnosis not present

## 2021-06-21 DIAGNOSIS — I739 Peripheral vascular disease, unspecified: Secondary | ICD-10-CM | POA: Diagnosis not present

## 2021-06-21 DIAGNOSIS — G5792 Unspecified mononeuropathy of left lower limb: Secondary | ICD-10-CM | POA: Diagnosis not present

## 2021-06-21 DIAGNOSIS — I69354 Hemiplegia and hemiparesis following cerebral infarction affecting left non-dominant side: Secondary | ICD-10-CM | POA: Diagnosis not present

## 2021-06-29 ENCOUNTER — Telehealth: Payer: Self-pay

## 2021-06-29 NOTE — Chronic Care Management (AMB) (Signed)
Chronic Care Management Pharmacy Assistant   Name: Anna Hayes  MRN: [id number] DOB: 12/16/1950  Reason for Encounter: Chart Review for CPP visit on 07/02/2021    Conditions to be addressed/monitored: HTN, HLD, and PVD, Stroke, Secondary Hypothyroidism   Recent office visits:  06/02/2021- Jerrell Belfast, NP (PCP)- Patient presented for follow up visit on Benign essential HTN, Mixed hyperlipidemia, Secondary hypothyroidism, Acquired thrombophilia ([place]), PVD (peripheral vascular disease) ([place]), B12 deficiency, Need for vaccination, Cigarette nicotine dependence with other nicotine-induced disorder, History of CVA with residual deficit, Left hemiplegia ([place]), Hx of AKA (above knee amputation), left ([place]). Cyancobalamin 1000 mcg injection given. Fluad Quad (high Dose 65+) given No medication changes noted Return in about 3 months (around 09/01/2021) for hypertension 03/18/2021- Rochel Brome, MD (PCP)- Telephone Encounter- Atenolol 50 mg changed from 1/2 tablet a day to 1 tablet daily. 03/01/2021- Jerrell Belfast, NP (PCP)- Patient presented for follow up visit on Benign essential HTN, Mixed hyperlipidemia, Secondary hypothyroidism, Acquired thrombophilia ([place]), PVD (peripheral vascular disease) ([place]), B12 deficiency. Cyancobalamin 1000 mcg injection given. Cyancobalamin 1000 mcg intramusclar injection ordered. Return in about 3 months (around 06/01/2021) for fasting 02/18/2021- Rochel Brome, MD (PCP)- Telephone Encounter- Atenolol 50 mg changed from 1 tablet daily to 1/2 tablet daily, Omega-3 acide Ethyl Esters 1 g instructions changed to 2 capsules by mouth 2 times daily. 02/09/2021- Rhae Hammock, LPN (PCP)- Cyanocobalamin 1000 mcg injection given.   01/05/2021- Meredith Mody, RN (PCP)- Cyanocobalamin 1000 mcg injection given. 01/04/2021- Jerrell Belfast, NP (PCP)- Patient presented for follow up visit on Mobility impaired, Unilateral AKA, left ([place]), Left hemiplegia ([place]), History of CVA with  residual deficit, PVD (peripheral vascular disease) ([place]), Ischemic neuropathy of left foot. No medication changes noted Return for 03/01/21 at 11   Recent consult visits:  None  Hospital visits:  None in previous 6 months   No results found for: HGBA1C, MICROALBUR   BP Readings from Last 3 Encounters:  06/02/21 (!) 146/78  03/01/21 122/72  01/04/21 136/64     Medications: Outpatient Encounter Medications as of 06/29/2021  Medication Sig   atenolol (TENORMIN) 50 MG tablet TAKE 1 TABLET BY MOUTH EVERY DAY   butalbital-acetaminophen-caffeine (FIORICET) 50-325-40 MG tablet Take 1 tablet by mouth every 6 (six) hours as needed for migraine.   cyanocobalamin (,VITAMIN B-12,) 1000 MCG/ML injection Inject into the muscle.   furosemide (LASIX) 20 MG tablet Take 20 mg by mouth daily as needed for fluid.    levothyroxine (SYNTHROID) 25 MCG tablet Take 1 tablet (25 mcg total) by mouth daily.   LINZESS 145 MCG CAPS capsule TAKE 1 CAPSULE BY MOUTH DAILY BEFORE BREAKFAST.   lisinopril (ZESTRIL) 20 MG tablet Take 1 tablet (20 mg total) by mouth daily.   omega-3 acid ethyl esters (LOVAZA) 1 g capsule TAKE 2 CAPSULES BY MOUTH 2 TIMES DAILY.   omeprazole (PRILOSEC) 40 MG capsule TAKE 1 CAPSULE BY MOUTH EVERY DAY   promethazine (PHENERGAN) 25 MG tablet Take 1 tablet (25 mg total) by mouth every 8 (eight) hours as needed.   simvastatin (ZOCOR) 40 MG tablet TAKE 1 TABLET BY MOUTH EVERYDAY AT BEDTIME   XARELTO 20 MG TABS tablet TAKE 1 TABLET BY MOUTH DAILY WITH SUPPER.   No facility-administered encounter medications on file as of 06/29/2021.    Care Gaps: Annual Wellness Visit not complete- sent message to PCP team Rhae Hammock, LPN to schedule. Mammogram completed 12/09/2020 Dexa Scan completed on 12/06/2019 Colonoscopy- none found TETANUS/TDAP- Overdue - never done  Zoster Vaccines- Shingrix (1 of 2)- Never done  COVID-19 Vaccine (4 - Booster for [employer] series)- Last completed: Aug 27, 2020    Star Rating Drugs: Lisinopril 20 mg- Last filled on 06/02/2021 for 90 day supply from [facility] Simvastatin 40 mg- Last filled on 03/23/2021 for 90 day supply from [place], [place] Pharmacist Assistant [phone]

## 2021-07-02 ENCOUNTER — Other Ambulatory Visit: Payer: Self-pay

## 2021-07-02 ENCOUNTER — Ambulatory Visit (INDEPENDENT_AMBULATORY_CARE_PROVIDER_SITE_OTHER): Payer: Medicare HMO

## 2021-07-02 DIAGNOSIS — E782 Mixed hyperlipidemia: Secondary | ICD-10-CM

## 2021-07-02 DIAGNOSIS — D6869 Other thrombophilia: Secondary | ICD-10-CM

## 2021-07-02 DIAGNOSIS — I10 Essential (primary) hypertension: Secondary | ICD-10-CM

## 2021-07-02 DIAGNOSIS — E038 Other specified hypothyroidism: Secondary | ICD-10-CM

## 2021-07-02 NOTE — Progress Notes (Addendum)
Chronic Care Management Pharmacy Note  07/02/2021 Name:  Anna Hayes MRN:  [id number] DOB:  [birth year]  Summary: -Pleasant 70 year old female presents for initial CCM visit. She has been a widow for 10 years and lives with her daughter. 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 Her left knee was amputated in 2021 and she struggles with it. She was doing [employer] as exercise but is hesitant to go back due to Covid  Recommendations/Changes made from today's visit: -None   Subjective: Anna Hayes is an 70 y.o. year old female who is a primary patient of Rip Harbour, NP.  The CCM team was consulted for assistance with disease management and care coordination needs.    Engaged with patient by telephone for initial visit in response to provider referral for pharmacy case management and/or care coordination services.   Consent to Services:  The patient was given the following information about Chronic Care Management services today, agreed to services, and gave verbal consent: 1. CCM service includes personalized support from designated clinical staff supervised by the primary care provider, including individualized plan of care and coordination with other care providers 2. 24/7 contact phone numbers for assistance for urgent and routine care needs. 3. Service will only be billed when office clinical staff spend 20 minutes or more in a month to coordinate care. 4. Only one practitioner may furnish and bill the service in a calendar month. 5.The patient may stop CCM services at any time (effective at the end of the month) by phone call to the office staff. 6. The patient will be responsible for cost sharing (co-pay) of up to 20% of the service fee (after annual deductible is met). Patient agreed to services and consent  obtained.  Patient Care Team: Rip Harbour, NP as PCP - General (Nurse Practitioner) Burnice Logan, [facility] (Inactive) as Pharmacist (Pharmacist)  Recent office visits:  06/02/2021- Jerrell Belfast, NP (PCP)- Patient presented for follow up visit on Benign essential HTN, Mixed hyperlipidemia, Secondary hypothyroidism, Acquired thrombophilia ([place]), PVD (peripheral vascular disease) ([place]), B12 deficiency, Need for vaccination, Cigarette nicotine dependence with other nicotine-induced disorder, History of CVA with residual deficit, Left hemiplegia ([place]), Hx of AKA (above knee amputation), left ([place]). Cyancobalamin 1000 mcg injection given. Fluad Quad (high Dose 65+) given No medication changes noted Return in about 3 months (around 09/01/2021) for hypertension 03/18/2021- Rochel Brome, MD (PCP)- Telephone Encounter- Atenolol 50 mg changed from 1/2 tablet a day to 1 tablet daily. 03/01/2021- Jerrell Belfast, NP (PCP)- Patient presented for follow up visit on Benign essential HTN, Mixed hyperlipidemia, Secondary hypothyroidism, Acquired thrombophilia ([place]), PVD (peripheral vascular disease) ([place]), B12 deficiency. Cyancobalamin 1000 mcg injection given. Cyancobalamin 1000 mcg intramusclar injection ordered. Return in about 3 months (around 06/01/2021) for fasting 02/18/2021- Rochel Brome, MD (PCP)- Telephone Encounter- Atenolol 50 mg changed from 1 tablet daily to 1/2 tablet daily, Omega-3 acide Ethyl Esters 1 g instructions changed to 2 capsules by mouth 2 times daily. 02/09/2021- Rhae Hammock, LPN (PCP)- Cyanocobalamin 1000 mcg injection given.   01/05/2021- Meredith Mody, RN (PCP)- Cyanocobalamin 1000 mcg injection given. 01/04/2021- Jerrell Belfast, NP (PCP)- Patient presented for follow up visit on Mobility impaired, Unilateral AKA, left ([place]), Left hemiplegia ([place]), History of CVA with residual deficit, PVD (peripheral vascular disease) ([place]),  Ischemic neuropathy of left foot. No medication changes  noted Return for 03/01/21 at 11     Recent consult visits:  None   Hospital visits:  None in previous 6 months   Objective:  Lab Results  Component Value Date   CREATININE 0.92 06/09/2021   BUN 14 06/09/2021   GFRNONAA 60 08/19/2020   GFRAA 70 08/19/2020   NA 142 06/09/2021   K 5.1 06/09/2021   CALCIUM 9.4 06/09/2021   CO2 20 06/09/2021   GLUCOSE 95 06/09/2021    No results found for: HGBA1C, FRUCTOSAMINE, GFR, MICROALBUR  Last diabetic Eye exam: No results found for: HMDIABEYEEXA  Last diabetic Foot exam: No results found for: HMDIABFOOTEX   Lab Results  Component Value Date   CHOL 134 06/02/2021   HDL 43 06/02/2021   LDLCALC 66 06/02/2021   TRIG 145 06/02/2021   CHOLHDL 3.1 06/02/2021    Hepatic Function Latest Ref Rng & Units 06/09/2021 06/02/2021 03/01/2021  Total Protein 6.0 - 8.5 g/dL 6.7 7.0 7.4  Albumin 3.7 - 4.7 g/dL 4.1 4.2 4.5  AST 0 - 40 IU/L _0 ALT 0 - 32 IU/L _1 Alk Phosphatase 44 - 121 IU/L 91 80 95  Total Bilirubin 0.0 - 1.2 mg/dL 0.2 0.2 0.2  Bilirubin, Direct 0.1 - 0.5 mg/dL - - -    Lab Results  Component Value Date/Time   TSH 3.240 06/02/2021 03:07 PM   TSH 2.890 03/01/2021 02:44 PM    CBC Latest Ref Rng & Units 06/02/2021 03/01/2021 11/24/2020  WBC 3.4 - 10.8 x10E3/uL 9.1 7.6 8.5  Hemoglobin 11.1 - 15.9 g/dL 11.4 11.7 11.1  Hematocrit 34.0 - 46.6 % 35.2 36.6 34.5  Platelets 150 - 450 x10E3/uL 464(H) 421 452(H)    No results found for: VD25OH  Clinical ASCVD: Yes  The ASCVD Risk score (Arnett DK, et al., 2019) failed to calculate for the following reasons:   The patient has a prior MI or stroke diagnosis    Depression screen [facility] 2/9 11/24/2020 07/21/2020 12/01/2019  Decreased Interest 0 0 0  Down, Depressed, Hopeless 0 0 0  PHQ - 2 Score 0 0 0  Altered sleeping - - -  Tired, decreased energy - - -  Change in appetite - - -  Feeling bad or failure about yourself  - - -  Trouble concentrating - - -  Moving slowly or  fidgety/restless - - -  Suicidal thoughts - - -  PHQ-9 Score - - -  Difficult doing work/chores - - -     Other: (CHADS2VASc if Afib, MMRC or CAT for COPD, ACT, DEXA)  Social History   Tobacco Use  Smoking Status Some Days   Packs/day: 0.25   Years: 53.00   Pack years: 13.25   Types: Cigarettes  Smokeless Tobacco Never   BP Readings from Last 3 Encounters:  06/02/21 (!) 146/78  03/01/21 122/72  01/04/21 136/64   Pulse Readings from Last 3 Encounters:  06/02/21 70  03/01/21 68  01/04/21 72   Wt Readings from Last 3 Encounters:  06/02/21 192 lb (87.1 kg)  03/01/21 192 lb (87.1 kg)  01/04/21 190 lb (86.2 kg)   BMI Readings from Last 3 Encounters:  06/02/21 35.12 kg/m  03/01/21 35.12 kg/m  01/04/21 34.75 kg/m    Assessment/Interventions: Review of patient past medical history, allergies, medications, health status, including review of consultants reports, laboratory and other test data, was performed as part of comprehensive evaluation  and provision of chronic care management services.   SDOH:  (Social Determinants of Health) assessments and interventions performed: Yes SDOH Interventions    Flowsheet Row Most Recent Value  SDOH Interventions   Financial Strain Interventions Intervention Not Indicated  Transportation Interventions Other (Comment), Intervention Not Indicated  [Amputation but daughter takes care of her]      SDOH Screenings   Alcohol Screen: Not on file  Depression (PHQ2-9): Low Risk    PHQ-2 Score: 0  Financial Resource Strain: Low Risk    Difficulty of Paying Living Expenses: Not hard at all  Food Insecurity: Not on file  Housing: Not on file  Physical Activity: Not on file  Social Connections: Not on file  Stress: Not on file  Tobacco Use: High Risk   Smoking Tobacco Use: Some Days   Smokeless Tobacco Use: Never  Transportation Needs: No Transportation Needs   Lack of Transportation (Medical): No   Lack of Transportation  (Non-Medical): No    CCM Care Plan  Allergies  Allergen Reactions   Latex Other (See Comments)    Blisters   Codeine Other (See Comments)    UNSPECIFIED REACTION    Lyrica [Pregabalin] Hives   Nurtec [Rimegepant Sulfate] Other (See Comments)    Stomach upset   Penicillins Hives, Swelling and Other (See Comments)    Has patient had a PCN reaction causing immediate rash, facial/tongue/throat swelling, SOB or lightheadedness with hypotension: Yes Has patient had a PCN reaction causing severe rash involving mucus membranes or skin necrosis: No Has patient had a PCN reaction that required hospitalization: No Has patient had a PCN reaction occurring within the last 10 years: No If all of the above answers are "NO", then may proceed with Cephalosporin use.    Amlodipine Other (See Comments)    Pedal edema   Aspirin Palpitations and Other (See Comments)    Abdominal pain   Sulfa Antibiotics Rash    Medications Reviewed Today     Reviewed by Lane Hacker, [facility] (Pharmacist) on 07/02/21 at 1344  Med List Status: <None>   Medication Order Taking? Sig Documenting Provider Last Dose Status Informant  atenolol (TENORMIN) 50 MG tablet [id number] Yes TAKE 1 TABLET BY MOUTH EVERY DAY Marge Duncans, PA-C Taking Active   butalbital-acetaminophen-caffeine (FIORICET) 50-325-40 MG tablet [id number] Yes Take 1 tablet by mouth every 6 (six) hours as needed for migraine. Rip Harbour, NP Taking Active   cyanocobalamin (,VITAMIN B-12,) 1000 MCG/ML injection [id number] Yes Inject into the muscle. [provider] Taking Active   furosemide (LASIX) 20 MG tablet [id number] Yes Take 20 mg by mouth daily as needed for fluid.  [provider] Taking Active Self           Med Note Brett Albino, Shaune Pollack   Wed Jun 28, 2017 12:15 PM)    levothyroxine (SYNTHROID) 25 MCG tablet [id number] Yes Take 1 tablet (25 mcg total) by mouth daily. Rip Harbour, NP Taking Active   LINZESS 145 MCG CAPS  capsule [id number] Yes TAKE 1 CAPSULE BY MOUTH DAILY BEFORE BREAKFAST. Cox, Kirsten, MD Taking Active   lisinopril (ZESTRIL) 20 MG tablet [id number] Yes Take 1 tablet (20 mg total) by mouth daily. Rip Harbour, NP Taking Active   omega-3 acid ethyl esters (LOVAZA) 1 g capsule [id number] Yes TAKE 2 CAPSULES BY MOUTH 2 TIMES DAILY. Cox, Kirsten, MD Taking Active   omeprazole (PRILOSEC) 40 MG capsule [id number] Yes TAKE 1 CAPSULE BY MOUTH EVERY DAY Cox, Kirsten, MD  Taking Active   promethazine (PHENERGAN) 25 MG tablet [id number] Yes Take 1 tablet (25 mg total) by mouth every 8 (eight) hours as needed. Rip Harbour, NP Taking Active   simvastatin (ZOCOR) 40 MG tablet [id number] Yes TAKE 1 TABLET BY MOUTH EVERYDAY AT BEDTIME Cox, Kirsten, MD Taking Active   XARELTO 20 MG TABS tablet [id number] Yes TAKE 1 TABLET BY MOUTH DAILY WITH SUPPER. Lillard Anes, MD Taking Active             Patient Active Problem List   Diagnosis Date Noted   Acquired absence of left leg above knee ([place]) 12/01/2019   Mixed hyperlipidemia 11/18/2019   Secondary hypothyroidism 11/18/2019   Spastic hemiparesis of left nondominant side ([place]) 12/27/2017   Migraine headache without aura 08/29/2017   Amputation of left lower extremity above knee upon examination ([place]) 07/12/2017   Amputation above knee ([place])    Neuropathic pain    Benign essential HTN    Slow transit constipation    DNR (do not resuscitate) discussion    Palliative care by specialist    Ischemic neuropathy of left foot    History of CVA with residual deficit    Tobacco abuse    PVD (peripheral vascular disease) ([place]) 06/27/2017   Stroke ([place])     Immunization History  Administered Date(s) Administered   Fluad Quad(high Dose 65+) 06/18/2020, 06/02/2021   Influenza, High Dose Seasonal PF 07/02/2017   Influenza-Unspecified 07/27/2018   PFIZER(Purple Top)SARS-COV-2 Vaccination 12/09/2019, 01/06/2020, 08/27/2020   Pneumococcal Conjugate-13  09/14/2015   Pneumococcal Polysaccharide-23 07/31/2012    Conditions to be addressed/monitored:  Hypertension and Hyperlipidemia  Care Plan : [place]  Updates made by Lane Hacker, RPH since 07/02/2021 12:00 AM     Problem: HTN, Thyroid, Anticoag, Lipids   Priority: High  Onset Date: 07/02/2021     Goal: Disease State Management   Start Date: 07/02/2021  Expected End Date: 07/02/2022  This Visit's Progress: On track  Priority: High  Note:   Current Barriers:  Does not maintain contact with provider office Does not contact provider office for questions/concerns  Pharmacist Clinical Goal(s):  Patient will contact provider office for questions/concerns as evidenced notation of same in electronic health record through collaboration with PharmD and provider.   Interventions: 1:1 collaboration with Rip Harbour, NP regarding development and update of comprehensive plan of care as evidenced by provider attestation and co-signature Inter-disciplinary care team collaboration (see longitudinal plan of care) Comprehensive medication review performed; medication list updated in electronic medical record  Cardio (BP goal <140/90) BP Readings from Last 3 Encounters:  06/02/21 (!) 146/78  03/01/21 122/72  01/04/21 136/64  -Controlled -Current treatment: Lisinopril 7m Atenolol 532mFurosemide 2019mRN (Hasn't needed in 6 weeks) -Medications previously tried: N/A  -Current home readings: Not testing -Current dietary habits: "Tries to eat healthy"  -Current exercise habits: Was doing "Silver Sneakers" but stopped once Covid started and hasn't been back -Denies hypotensive/hypertensive symptoms -Educated on BP goals and benefits of medications for prevention of heart attack, stroke and kidney damage; Extensive time spent on counseling patient on blood pressure goal and impact of each antihypertensive medication on their blood pressure and risk reduction for CV  disease.  Used analogies to explain the need for multiple antihypertensive medications to achieve BP goals and that it is often a silent disease with no symptoms.  -Counseled to monitor BP at home Weekly, document, and provide log at future appointments October 2022:  Counseled patient on importance of BP readings and about possibly going back to [employer]. She's afraid of Covid but is not up to date on vaccinations (Has 3/4 shots). Recommend she get 4th booster and then possibly try exercising  Hyperlipidemia: (LDL goal < 100) The ASCVD Risk score (Arnett DK, et al., 2019) failed to calculate for the following reasons:   The patient has a prior MI or stroke diagnosis Lab Results  Component Value Date   CHOL 134 06/02/2021   CHOL 157 03/01/2021   CHOL 125 11/24/2020   Lab Results  Component Value Date   HDL 43 06/02/2021   HDL 44 03/01/2021   HDL 42 11/24/2020   Lab Results  Component Value Date   LDLCALC 66 06/02/2021   LDLCALC 82 03/01/2021   LDLCALC 59 11/24/2020   Lab Results  Component Value Date   TRIG 145 06/02/2021   TRIG 180 (H) 03/01/2021   TRIG 137 11/24/2020   Lab Results  Component Value Date   CHOLHDL 3.1 06/02/2021   CHOLHDL 3.6 03/01/2021   CHOLHDL 3.0 11/24/2020  No results found for: LDLDIRECT -Controlled -Current treatment: Simvastatin 80m -Medications previously tried: N/A  -Current dietary habits: "Tries to eat healthy"  -Current exercise habits: Was doing "Silver Sneakers" but stopped once Covid started and hasn't been back -Educated on Cholesterol goals; .lipiplan -Recommended to continue current medication (Could try high intensity statin due to risk factors but labs look at goal, patient is content maintaining status quo)  Thyroid Lab Results  Component Value Date   TSH 3.240 06/02/2021   Lab Results  Component Value Date   TSH 3.240 06/02/2021  -Controlled -Current treatment  Levothyroxine 281m -Medications previously tried:  N/A  -Recommended to continue current medication  Migraine (Goal: Decrease amount of Migraines) -Controlled -Current treatment: Butalbital -Medications previously tried: N/A  -Aura warning S/S: Didn't specify -Triggers:Didn't specify -Counseled on tracking auras and triggers -Recommended to continue current medication  Anticoagulation -Acquired Thrombophilia -Controlled -Current treatment  Xarelto 2072mS -Medications previously tried: N/A  -Recommended to continue current medication  Patient Goals/Self-Care Activities Patient will:  - take medications as prescribed  Follow Up Plan: The patient has been provided with contact information for the care management team and has been advised to call with any health related questions or concerns.   NatArizona Constableharm.D. - [phone]  CPP F/U July 2023       Medication Assistance: None required.  Patient affirms current coverage meets needs.  Compliance/Adherence/Medication fill history: Care Gaps: Annual Wellness Visit not complete- sent message to PCP team KimRhae HammockPN to schedule. Mammogram completed 12/09/2020 Dexa Scan completed on 12/06/2019 Colonoscopy- none found TETANUS/TDAP- Overdue - never done  Zoster Vaccines- Shingrix (1 of 2)- Never done  COVID-19 Vaccine (4 - Booster for [employer])- Last completed: Aug 27, 2020       Star Rating Drugs: Lisinopril 20 mg- Last filled on 06/02/2021 for 90 day supply from [facility] Simvastatin 40 mg- Last filled on 03/23/2021 for 90 day supply from [facility]  [facility]  [id number] [address] [id number]: [phone] Fax: [phone] Plan and Follow Up Patient Decision:  Patient agrees to Care Plan and Follow-up.  Plan: The patient has been provided with contact information for the care management team and has been advised to call with  any health related questions or  concerns.   [place] Constable, Pharm.D. - [phone]  CPP F/U July 2023

## 2021-07-02 NOTE — Patient Instructions (Signed)
Visit Information   Goals Addressed             This Visit's Progress    Manage My Medicine       Timeframe:  Long-Range Goal Priority:  High Start Date:                             Expected End Date:                       Follow Up Date 07/02/22   - keep a list of all the medicines I take; vitamins and herbals too - learn to read medicine labels - use a pillbox to sort medicine    Why is this important?   These steps will help you keep on track with your medicines.   Notes:      Track and Manage My Blood Pressure-Hypertension       Timeframe:  Long-Range Goal Priority:  High Start Date:                             Expected End Date:                       Follow Up Date 07/02/22   - check blood pressure weekly - choose a place to take my blood pressure (home, clinic or office, retail store) - write blood pressure results in a log or diary    Why is this important?   You won't feel high blood pressure, but it can still hurt your blood vessels.  High blood pressure can cause heart or kidney problems. It can also cause a stroke.  Making lifestyle changes like losing a little weight or eating less salt will help.  Checking your blood pressure at home and at different times of the day can help to control blood pressure.  If the doctor prescribes medicine remember to take it the way the doctor ordered.  Call the office if you cannot afford the medicine or if there are questions about it.     Notes:        Patient Care Plan: CCM Pharmacy Care Plan     Problem Identified: HTN, Thyroid, Anticoag, Lipids   Priority: High  Onset Date: 07/02/2021     Goal: Disease State Management   Start Date: 07/02/2021  Expected End Date: 07/02/2022  This Visit's Progress: On track  Priority: High  Note:   Current Barriers:  Does not maintain contact with provider office Does not contact provider office for questions/concerns  Pharmacist Clinical Goal(s):  Patient will contact  provider office for questions/concerns as evidenced notation of same in electronic health record through collaboration with PharmD and provider.   Interventions: 1:1 collaboration with Rip Harbour, NP regarding development and update of comprehensive plan of care as evidenced by provider attestation and co-signature Inter-disciplinary care team collaboration (see longitudinal plan of care) Comprehensive medication review performed; medication list updated in electronic medical record  Cardio (BP goal <140/90) BP Readings from Last 3 Encounters:  06/02/21 (!) 146/78  03/01/21 122/72  01/04/21 136/64  -Controlled -Current treatment: Lisinopril 20mg  Atenolol 50mg  Furosemide 20mg  PRN (Hasn't needed in 6 weeks) -Medications previously tried: N/A  -Current home readings: Not testing -Current dietary habits: "Tries to eat healthy"  -Current exercise habits: Was doing "Silver Sneakers" but stopped once Covid started and hasn't  been back -Denies hypotensive/hypertensive symptoms -Educated on BP goals and benefits of medications for prevention of heart attack, stroke and kidney damage; Extensive time spent on counseling patient on blood pressure goal and impact of each antihypertensive medication on their blood pressure and risk reduction for CV disease.  Used analogies to explain the need for multiple antihypertensive medications to achieve BP goals and that it is often a silent disease with no symptoms.  -Counseled to monitor BP at home Weekly, document, and provide log at future appointments October 2022: Counseled patient on importance of BP readings and about possibly going back to [employer]. She's afraid of Covid but is not up to date on vaccinations (Has 3/4 shots). Recommend she get 4th booster and then possibly try exercising  Hyperlipidemia: (LDL goal < 100) The ASCVD Risk score (Arnett DK, et al., 2019) failed to calculate for the following reasons:   The patient has a prior  MI or stroke diagnosis Lab Results  Component Value Date   CHOL 134 06/02/2021   CHOL 157 03/01/2021   CHOL 125 11/24/2020   Lab Results  Component Value Date   HDL 43 06/02/2021   HDL 44 03/01/2021   HDL 42 11/24/2020   Lab Results  Component Value Date   LDLCALC 66 06/02/2021   LDLCALC 82 03/01/2021   LDLCALC 59 11/24/2020   Lab Results  Component Value Date   TRIG 145 06/02/2021   TRIG 180 (H) 03/01/2021   TRIG 137 11/24/2020   Lab Results  Component Value Date   CHOLHDL 3.1 06/02/2021   CHOLHDL 3.6 03/01/2021   CHOLHDL 3.0 11/24/2020  No results found for: LDLDIRECT -Controlled -Current treatment: Simvastatin 40mg  -Medications previously tried: N/A  -Current dietary habits: "Tries to eat healthy"  -Current exercise habits: Was doing "Silver Sneakers" but stopped once Covid started and hasn't been back -Educated on Cholesterol goals; .lipiplan -Recommended to continue current medication (Could try high intensity statin due to risk factors but labs look at goal, patient is content maintaining status quo)  Thyroid Lab Results  Component Value Date   TSH 3.240 06/02/2021   Lab Results  Component Value Date   TSH 3.240 06/02/2021  -Controlled -Current treatment  Levothyroxine 108mcg -Medications previously tried: N/A  -Recommended to continue current medication  Migraine (Goal: Decrease amount of Migraines) -Controlled -Current treatment: Butalbital -Medications previously tried: N/A  -Aura warning S/S: Didn't specify -Triggers:Didn't specify -Counseled on tracking auras and triggers -Recommended to continue current medication  Anticoagulation -Acquired Thrombophilia -Controlled -Current treatment  Xarelto 20mg  hS -Medications previously tried: N/A  -Recommended to continue current medication  Patient Goals/Self-Care Activities Patient will:  - take medications as prescribed  Follow Up Plan: The patient has been provided with contact  information for the care management team and has been advised to call with any health related questions or concerns.   [place] Constable, Pharm.D. - [phone]  CPP F/U July 2023      Ms. Sulser was given information about Chronic Care Management services today including:  CCM service includes personalized support from designated clinical staff supervised by her physician, including individualized plan of care and coordination with other care providers 24/7 contact phone numbers for assistance for urgent and routine care needs. Standard insurance, coinsurance, copays and deductibles apply for chronic care management only during months in which we provide at least 20 minutes of these services. Most insurances cover these services at 100%, however patients may be responsible for any copay, coinsurance and/or deductible if  applicable. This service may help you avoid the need for more expensive face-to-face services. Only one practitioner may furnish and bill the service in a calendar month. The patient may stop CCM services at any time (effective at the end of the month) by phone call to the office staff.  Patient agreed to services and verbal consent obtained.   The patient verbalized understanding of instructions, educational materials, and care plan provided today and declined offer to receive copy of patient instructions, educational materials, and care plan.  The pharmacy team will reach out to the patient again over the next 90 days.   Lane Hacker, [facility]

## 2021-07-03 MED ORDER — SIMVASTATIN 40 MG PO TABS: ORAL_TABLET | ORAL | 0 refills | Status: DC

## 2021-07-03 MED ORDER — BUTALBITAL-APAP-CAFFEINE 50-325-40 MG PO TABS: 1.0000 | ORAL_TABLET | Freq: Four times a day (QID) | ORAL | 2 refills | Status: DC | PRN

## 2021-07-23 ENCOUNTER — Ambulatory Visit (INDEPENDENT_AMBULATORY_CARE_PROVIDER_SITE_OTHER): Payer: Medicare HMO

## 2021-07-23 ENCOUNTER — Other Ambulatory Visit: Payer: Self-pay

## 2021-07-23 DIAGNOSIS — Z0000 Encounter for general adult medical examination without abnormal findings: Secondary | ICD-10-CM

## 2021-07-23 NOTE — Patient Instructions (Signed)
Health Maintenance, Female Adopting a healthy lifestyle and getting preventive care are important in promoting health and wellness. Ask your health care provider about: The right schedule for you to have regular tests and exams. Things you can do on your own to prevent diseases and keep yourself healthy. What should I know about diet, weight, and exercise? Eat a healthy diet  Eat a diet that includes plenty of vegetables, fruits, low-fat dairy products, and lean protein. Do not eat a lot of foods that are high in solid fats, added sugars, or sodium. Maintain a healthy weight Body mass index (BMI) is used to identify weight problems. It estimates body fat based on height and weight. Your health care provider can help determine your BMI and help you achieve or maintain a healthy weight. Get regular exercise Get regular exercise. This is one of the most important things you can do for your health. Most adults should: Exercise for at least 150 minutes each week. The exercise should increase your heart rate and make you sweat (moderate-intensity exercise). Do strengthening exercises at least twice a week. This is in addition to the moderate-intensity exercise. Spend less time sitting. Even light physical activity can be beneficial. Watch cholesterol and blood lipids Have your blood tested for lipids and cholesterol at 72 years of age, then have this test every 5 years. Have your cholesterol levels checked more often if: Your lipid or cholesterol levels are high. You are older than 72 years of age. You are at high risk for heart disease. What should I know about cancer screening? Depending on your health history and family history, you may need to have cancer screening at various ages. This may include screening for: Breast cancer. Cervical cancer. Colorectal cancer. Skin cancer. Lung cancer. What should I know about heart disease, diabetes, and high blood pressure? Blood pressure and heart  disease High blood pressure causes heart disease and increases the risk of stroke. This is more likely to develop in people who have high blood pressure readings, are of African descent, or are overweight. Have your blood pressure checked: Every 3-5 years if you are 18-39 years of age. Every year if you are 40 years old or older. Diabetes Have regular diabetes screenings. This checks your fasting blood sugar level. Have the screening done: Once every three years after age 40 if you are at a normal weight and have a low risk for diabetes. More often and at a younger age if you are overweight or have a high risk for diabetes. What should I know about preventing infection? Hepatitis B If you have a higher risk for hepatitis B, you should be screened for this virus. Talk with your health care provider to find out if you are at risk for hepatitis B infection. Hepatitis C Testing is recommended for: Everyone born from 1945 through 1965. Anyone with known risk factors for hepatitis C. Sexually transmitted infections (STIs) Get screened for STIs, including gonorrhea and chlamydia, if: You are sexually active and are younger than 72 years of age. You are older than 72 years of age and your health care provider tells you that you are at risk for this type of infection. Your sexual activity has changed since you were last screened, and you are at increased risk for chlamydia or gonorrhea. Ask your health care provider if you are at risk. Ask your health care provider about whether you are at high risk for HIV. Your health care provider may recommend a prescription medicine   to help prevent HIV infection. If you choose to take medicine to prevent HIV, you should first get tested for HIV. You should then be tested every 3 months for as long as you are taking the medicine. Pregnancy If you are about to stop having your period (premenopausal) and you may become pregnant, seek counseling before you get  pregnant. Take 400 to 800 micrograms (mcg) of folic acid every day if you become pregnant. Ask for birth control (contraception) if you want to prevent pregnancy. Osteoporosis and menopause Osteoporosis is a disease in which the bones lose minerals and strength with aging. This can result in bone fractures. If you are 54 years old or older, or if you are at risk for osteoporosis and fractures, ask your health care provider if you should: Be screened for bone loss. Take a calcium or vitamin D supplement to lower your risk of fractures. Be given hormone replacement therapy (HRT) to treat symptoms of menopause. Follow these instructions at home: Lifestyle Do not use any products that contain nicotine or tobacco, such as cigarettes, e-cigarettes, and chewing tobacco. If you need help quitting, ask your health care provider. Do not use street drugs. Do not share needles. Ask your health care provider for help if you need support or information about quitting drugs. Alcohol use Do not drink alcohol if: Your health care provider tells you not to drink. You are pregnant, may be pregnant, or are planning to become pregnant. If you drink alcohol: Limit how much you use to 0-1 drink a day. Limit intake if you are breastfeeding. Be aware of how much alcohol is in your drink. In the [place] one drink equals one 12 oz bottle of beer (355 mL), one 5 oz glass of wine (148 mL), or one 1 oz glass of hard liquor (44 mL). General instructions Schedule regular health, dental, and eye exams. Stay current with your vaccines. Tell your health care provider if: You often feel depressed. You have ever been abused or do not feel safe at home. Summary Adopting a healthy lifestyle and getting preventive care are important in promoting health and wellness. Follow your health care provider's instructions about healthy diet, exercising, and getting tested or screened for diseases. Follow your health care provider's  instructions on monitoring your cholesterol and blood pressure. This information is not intended to replace advice given to you by your health care provider. Make sure you discuss any questions you have with your health care provider. Document Revised: 11/20/2020 Document Reviewed: 09/05/2018 Elsevier Patient Education  2022 [place].  Steps to Quit Smoking Smoking tobacco is the leading cause of preventable death. It can affect almost every organ in the body. Smoking puts you and people around you at risk for many serious, long-lasting (chronic) diseases. Quitting smoking can be hard, but it is one of the best things that you can do for your health. It is never too late to quit. How do I get ready to quit? When you decide to quit smoking, make a plan to help you succeed. Before you quit: Pick a date to quit. Set a date within the next 2 weeks to give you time to prepare. Write down the reasons why you are quitting. Keep this list in places where you will see it often. Tell your family, friends, and co-workers that you are quitting. Their support is important. Talk with your doctor about the choices that may help you quit. Find out if your health insurance will pay for these  treatments. Know the people, places, things, and activities that make you want to smoke (triggers). Avoid them. What first steps can I take to quit smoking? Throw away all cigarettes at home, at work, and in your car. Throw away the things that you use when you smoke, such as ashtrays and lighters. Clean your car. Make sure to empty the ashtray. Clean your home, including curtains and carpets. What can I do to help me quit smoking? Talk with your doctor about taking medicines and seeing a counselor at the same time. You are more likely to succeed when you do both. If you are pregnant or breastfeeding, talk with your doctor about counseling or other ways to quit smoking. Do not take medicine to help you quit smoking  unless your doctor tells you to do so. To quit smoking: Quit right away Quit smoking totally, instead of slowly cutting back on how much you smoke over a period of time. Go to counseling. You are more likely to quit if you go to counseling sessions regularly. Take medicine You may take medicines to help you quit. Some medicines need a prescription, and some you can buy over-the-counter. Some medicines may contain a drug called nicotine to replace the nicotine in cigarettes. Medicines may: Help you to stop having the desire to smoke (cravings). Help to stop the problems that come when you stop smoking (withdrawal symptoms). Your doctor may ask you to use: Nicotine patches, gum, or lozenges. Nicotine inhalers or sprays. Non-nicotine medicine that is taken by mouth. Find resources Find resources and other ways to help you quit smoking and remain smoke-free after you quit. These resources are most helpful when you use them often. They include: Online chats with a Social worker. Phone quitlines. Printed Furniture conservator/restorer. Support groups or group counseling. Text messaging programs. Mobile phone apps. Use apps on your mobile phone or tablet that can help you stick to your quit plan. There are many free apps for mobile phones and tablets as well as websites. Examples include Quit Guide from the [employer] and smokefree.gov  What things can I do to make it easier to quit?  Talk to your family and friends. Ask them to support and encourage you. Call a phone quitline (1-800-QUIT-NOW), reach out to support groups, or work with a Social worker. Ask people who smoke to not smoke around you. Avoid places that make you want to smoke, such as: Bars. Parties. Smoke-break areas at work. Spend time with people who do not smoke. Lower the stress in your life. Stress can make you want to smoke. Try these things to help your stress: Getting regular exercise. Doing deep-breathing exercises. Doing  yoga. Meditating. Doing a body scan. To do this, close your eyes, focus on one area of your body at a time from head to toe. Notice which parts of your body are tense. Try to relax the muscles in those areas. How will I feel when I quit smoking? Day 1 to 3 weeks Within the first 24 hours, you may start to have some problems that come from quitting tobacco. These problems are very bad 2-3 days after you quit, but they do not often last for more than 2-3 weeks. You may get these symptoms: Mood swings. Feeling restless, nervous, angry, or annoyed. Trouble concentrating. Dizziness. Strong desire for high-sugar foods and nicotine. Weight gain. Trouble pooping (constipation). Feeling like you may vomit (nausea). Coughing or a sore throat. Changes in how the medicines that you take for other issues work in  your body. Depression. Trouble sleeping (insomnia). Week 3 and afterward After the first 2-3 weeks of quitting, you may start to notice more positive results, such as: Better sense of smell and taste. Less coughing and sore throat. Slower heart rate. Lower blood pressure. Clearer skin. Better breathing. Fewer sick days. Quitting smoking can be hard. Do not give up if you fail the first time. Some people need to try a few times before they succeed. Do your best to stick to your quit plan, and talk with your doctor if you have any questions or concerns. Summary Smoking tobacco is the leading cause of preventable death. Quitting smoking can be hard, but it is one of the best things that you can do for your health. When you decide to quit smoking, make a plan to help you succeed. Quit smoking right away, not slowly over a period of time. When you start quitting, seek help from your doctor, family, or friends. This information is not intended to replace advice given to you by your health care provider. Make sure you discuss any questions you have with your health care provider. Document  Revised: 06/07/2019 Document Reviewed: 12/01/2018 Elsevier Patient Education  [place].

## 2021-07-23 NOTE — Progress Notes (Signed)
Subjective:   Anna Hayes is a 70 y.o. female who presents for Medicare Annual (Subsequent) preventive examination.  This wellness visit is conducted by a nurse.  The patient's medications were reviewed and reconciled since the patient's last visit.  History details were provided by the patient.  The history appears to be reliable.    Patient's last AWV was one year ago.   Medical History: Patient history and Family history was reviewed  Medications, Allergies, and preventative health maintenance was reviewed and updated.  I connected with  Anna Hayes on 07/23/21 by an audio enabled telemedicine application and verified that I am speaking with the correct person using two identifiers.   I discussed the limitations of evaluation and management by telemedicine. The patient expressed understanding and agreed to proceed.  Engaged in the call today is: Anna Hayes, from home and Anna Hammock, LPN from [facility].   Review of Systems    ROS-Negative Cardiac Risk Factors include: advanced age (>52men, >49 women);dyslipidemia;smoking/ tobacco exposure     Objective:    Today's Vitals   07/23/21 1036  PainSc: 0-No pain   There is no height or weight on file to calculate BMI.  Advanced Directives 07/23/2021 06/12/2018 04/10/2018 01/24/2018 01/24/2018 07/12/2017 06/29/2017  Does Patient Have a Medical Advance Directive? No No No No No No No  Would patient like information on creating a medical advance directive? Yes (MAU/Ambulatory/Procedural Areas - Information given) No - Patient declined No - Patient declined No - Patient declined No - Patient declined No - Patient declined No - Patient declined    Current Medications (verified) Outpatient Encounter Medications as of 07/23/2021  Medication Sig   atenolol (TENORMIN) 50 MG tablet TAKE 1 TABLET BY MOUTH EVERY DAY   butalbital-acetaminophen-caffeine (FIORICET) 50-325-40 MG tablet Take 1 tablet by mouth every 6  (six) hours as needed for migraine.   cyanocobalamin (,VITAMIN B-12,) 1000 MCG/ML injection Inject into the muscle.   furosemide (LASIX) 20 MG tablet Take 20 mg by mouth daily as needed for fluid.    levothyroxine (SYNTHROID) 25 MCG tablet Take 1 tablet (25 mcg total) by mouth daily.   LINZESS 145 MCG CAPS capsule TAKE 1 CAPSULE BY MOUTH DAILY BEFORE BREAKFAST.   lisinopril (ZESTRIL) 20 MG tablet Take 1 tablet (20 mg total) by mouth daily.   omega-3 acid ethyl esters (LOVAZA) 1 g capsule TAKE 2 CAPSULES BY MOUTH 2 TIMES DAILY.   omeprazole (PRILOSEC) 40 MG capsule TAKE 1 CAPSULE BY MOUTH EVERY DAY   promethazine (PHENERGAN) 25 MG tablet Take 1 tablet (25 mg total) by mouth every 8 (eight) hours as needed.   simvastatin (ZOCOR) 40 MG tablet Take one tablet by mouth nightly   XARELTO 20 MG TABS tablet TAKE 1 TABLET BY MOUTH DAILY WITH SUPPER.   No facility-administered encounter medications on file as of 07/23/2021.    Allergies (verified) Latex, Codeine, Lyrica [pregabalin], Nurtec [rimegepant sulfate], Penicillins, Amlodipine, Aspirin, and Sulfa antibiotics   History: Past Medical History:  Diagnosis Date   Anemia    Constipation    DVT of lower extremity (deep venous thrombosis) (HCC)    Frequent headaches    Heart murmur    found during pregnancy -  no problems   Hypertension    Muscle pain    Palpitations    Septic thrombophlebitis 08/31/2017   Sinus problem    Stroke [facility])    Past Surgical History:  Procedure Laterality Date   AMPUTATION Left 07/05/2017  Procedure: LEFT ABOVE KNEE AMPUTATION;  Surgeon: Conrad Bay Hill, MD;  Location: [place];  Service: Vascular;  Laterality: Left;   CATARACT EXTRACTION W/PHACO Right 06/12/2018   Procedure: CATARACT EXTRACTION WITH INTRAOCULAR LENS IMPLANT;  Surgeon: Jalene Mullet, MD;  Location: [place];  Service: Ophthalmology;  Laterality: Right;   COLONOSCOPY  11/26/2015   Dr Lyda Jester - follow-up in 5 years   EMBOLECTOMY Bilateral  06/27/2017   Procedure: BILATERAL FEMORAL POPLITEAL EMBOLECTOMY;  Surgeon: Serafina Mitchell, MD;  Location: [facility] OR;  Service: Vascular;  Laterality: Bilateral;   FASCIOTOMY Left 06/27/2017   Procedure: FULL COMPARTMENT LEFT LOWER LEG FASCIOTOMY;  Surgeon: Serafina Mitchell, MD;  Location: [place];  Service: Vascular;  Laterality: Left;   PARTIAL HYSTERECTOMY     PATCH ANGIOPLASTY Left 06/27/2017   Procedure: PATCH ANGIOPLASTY Left Posterior Tibial Artery;  Surgeon: Serafina Mitchell, MD;  Location: [place];  Service: Vascular;  Laterality: Left;   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 07/11/2017   Procedure: REPAIR OF COMPLEX TRACTION RETINAL DETACHMENT WITH ENDO LASER AND ANTIFUNGAL INJECTION;  Surgeon: Jalene Mullet, MD;  Location: [place];  Service: Ophthalmology;  Laterality: Right;   TEE WITHOUT CARDIOVERSION N/A 07/11/2017   Procedure: TRANSESOPHAGEAL ECHOCARDIOGRAM (TEE);  Surgeon: Acie Fredrickson Wonda Cheng, MD;  Location: [facility] ENDOSCOPY;  Service: Cardiovascular;  Laterality: N/A;   Family History  Problem Relation Age of Onset   Breast cancer Mother    Prostate cancer Father    Prostate cancer Brother    Social History   Socioeconomic History   Marital status: Widowed  Tobacco Use   Smoking status: Some Days    Packs/day: 0.25    Years: 53.00    Pack years: 13.25    Types: Cigarettes   Smokeless tobacco: Never  Vaping Use   Vaping Use: Never used  Substance and Sexual Activity   Alcohol use: No   Drug use: No   Sexual activity: Not Currently  Social History Narrative   Lives with her daughter   Social Determinants of Health   Financial Resource Strain: Low Risk    Difficulty of Paying Living Expenses: Not hard at all  Food Insecurity: No Food Insecurity   Worried About Charity fundraiser in the Last Year: Never true   Ran Out of Food in the Last Year: Never true  Transportation Needs: No Transportation Needs   Lack of Transportation (Medical): No   Lack of  Transportation (Non-Medical): No  Physical Activity: Not on file  Stress: Not on file  Social Connections: Not on file    Tobacco Counseling Counseling given: Patient encouraged to stop smoking.  We discussed health associated and printed information mailed.   Clinical Intake:  Pre-visit preparation completed: Yes Pain : No/denies pain Pain Score: 0-No pain   Nutritional Risks: None Diabetes: No How often do you need to have someone help you when you read instructions, pamphlets, or other written materials from your doctor or pharmacy?: 2 - Rarely Interpreter Needed?: No   Activities of Daily Living In your present state of health, do you have any difficulty performing the following activities: 07/23/2021  Hearing? N  Vision? N  Difficulty concentrating or making decisions? N  Walking or climbing stairs? Y  Dressing or bathing? N  Doing errands, shopping? Y  Preparing Food and eating ? N  Using the Toilet? N  In the past six months, have you accidently leaked urine? N  Do you have problems with loss of  bowel control? N  Managing your Medications? N  Managing your Finances? N  Housekeeping or managing your Housekeeping? N  Some recent data might be hidden    Patient Care Team: Anna Harbour, NP as PCP - General (Nurse Practitioner) Anna Hayes, [facility] (Inactive) as Pharmacist (Pharmacist)    Assessment:   This is a routine wellness examination for [place].  Hearing/Vision screen No results found.  Dietary issues and exercise activities discussed: Current Exercise Habits: The patient does not participate in regular exercise at present  Depression Screen PHQ 2/9 Scores 07/23/2021 11/24/2020 07/21/2020 12/01/2019 10/02/2017 08/31/2017 08/29/2017  PHQ - 2 Score 0 0 0 0 0 0 0  PHQ- 9 Score - - - - - - 2    Fall Risk Fall Risk  07/23/2021 11/24/2020 07/21/2020 02/19/2020 12/27/2017  Falls in the past year? 0 1 0 0 No  Number falls in past yr: 0 0 0 0 -  Injury with Fall? 0 0  0 0 -  Risk for fall due to : Impaired mobility - Impaired mobility;No Fall Risks - -  Follow up Falls evaluation completed;Falls prevention discussed;Education provided - Falls evaluation completed;Falls prevention discussed - -    FALL RISK PREVENTION PERTAINING TO THE HOME:  Home free of loose throw rugs in walkways, pet beds, electrical cords, etc? Yes  Adequate lighting in your home to reduce risk of falls? Yes   ASSISTIVE DEVICES UTILIZED TO PREVENT FALLS:  Use of a cane, walker or w/c? Yes  Grab bars in the bathroom? Yes  Shower chair or bench in shower? Yes   Cognitive Function:     6CIT Screen 07/23/2021  What Year? 0 points  What month? 0 points  What time? 0 points  Count back from 20 0 points  Months in reverse 2 points  Repeat phrase 0 points  Total Score 2    Immunizations Immunization History  Administered Date(s) Administered   Fluad Quad(high Dose 65+) 06/18/2020, 06/02/2021   Influenza, High Dose Seasonal PF 07/02/2017   Influenza-Unspecified 07/27/2018   PFIZER(Purple Top)SARS-COV-2 Vaccination 12/09/2019, 01/06/2020, 08/27/2020   Pneumococcal Conjugate-13 09/14/2015   Pneumococcal Polysaccharide-23 07/31/2012, 08/06/2018    TDAP status: Due, Education has been provided regarding the importance of this vaccine. Advised may receive this vaccine at local pharmacy or Health Dept. Aware to provide a copy of the vaccination record if obtained from local pharmacy or Health Dept. Verbalized acceptance and understanding.  Flu Vaccine status: Up to date  Pneumococcal vaccine status: Up to date  Covid-19 vaccine status: Information provided on how to obtain vaccines.   Screening Tests Health Maintenance  Topic Date Due   TETANUS/TDAP  Never done   Zoster Vaccines- Shingrix (1 of 2) Never done   COVID-19 Vaccine (4 - Booster for Pfizer series) 10/22/2020   COLONOSCOPY (Pts 45-52yrs Insurance coverage will need to be confirmed)  11/25/2020   MAMMOGRAM   12/10/2022   Pneumonia Vaccine 48+ Years old  Completed   INFLUENZA VACCINE  Completed   DEXA SCAN  Completed   Hepatitis C Screening  Completed   HPV VACCINES  Aged Out    Health Maintenance  Health Maintenance Due  Topic Date Due   TETANUS/TDAP  Never done   Zoster Vaccines- Shingrix (1 of 2) Never done   COVID-19 Vaccine (4 - Booster for [place] series) 10/22/2020   COLONOSCOPY (Pts 45-14yrs Insurance coverage will need to be confirmed)  11/25/2020    Colorectal cancer screening: Type of screening: Colonoscopy.  Completed 11/2015. Repeat every 5 years  Mammogram status: Completed 11/2020. Repeat every year  Bone Density status: Completed 11/2019.   Lung Cancer Screening: (Low Dose CT Chest recommended if Age 3-80 years, 30 pack-year currently smoking OR have quit w/in 15years.) does not qualify.   Additional Screening:  Vision Screening: Recommended annual ophthalmology exams for early detection of glaucoma and other disorders of the eye. Is the patient up to date with their annual eye exam?  No  Who is the provider or what is the name of the office in which the patient attends annual eye exams? Dr Posey Pronto   Dental Screening: Recommended annual dental exams for proper oral hygiene    Plan:    1- Recommended COVID Booster - patient wants to get it at her next appointment in December 2- Colorectal Cancer Screening - last colonoscopy with Dr Lyda Jester 11/2015 with a recommended 5 year follow-up.  Patient will call to schedule. 3- Mammogram - Due March 2023 4- Recommend doing chair exercises 5- Discussed advance directive - printed information mailed 6- Discussed smoking cessation - printed information mailed  I have personally reviewed and noted the following in the patient's chart:   Medical and social history Use of alcohol, tobacco or illicit drugs  Current medications and supplements including opioid prescriptions.  Functional ability and status Nutritional  status Physical activity Advanced directives List of other physicians Hospitalizations, surgeries, and ER visits in previous 12 months Screenings to include cognitive, depression, and falls Referrals and appointments  In addition, I have reviewed and discussed with patient certain preventive protocols, quality metrics, and best practice recommendations. A written personalized care plan for preventive services as well as general preventive health recommendations were provided to patient.     Anna Noe, LPN   [id number]

## 2021-07-26 DIAGNOSIS — I10 Essential (primary) hypertension: Secondary | ICD-10-CM

## 2021-07-26 DIAGNOSIS — E038 Other specified hypothyroidism: Secondary | ICD-10-CM

## 2021-07-26 DIAGNOSIS — E782 Mixed hyperlipidemia: Secondary | ICD-10-CM

## 2021-07-30 ENCOUNTER — Other Ambulatory Visit: Payer: Self-pay

## 2021-07-30 DIAGNOSIS — R112 Nausea with vomiting, unspecified: Secondary | ICD-10-CM

## 2021-07-30 MED ORDER — PROMETHAZINE HCL 25 MG PO TABS: 25.0000 mg | ORAL_TABLET | Freq: Three times a day (TID) | ORAL | 0 refills | Status: DC | PRN

## 2021-08-02 ENCOUNTER — Other Ambulatory Visit: Payer: Self-pay

## 2021-08-02 MED ORDER — RIVAROXABAN 20 MG PO TABS: 20.0000 mg | ORAL_TABLET | Freq: Every day | ORAL | 1 refills | Status: DC

## 2021-08-23 ENCOUNTER — Telehealth: Payer: Self-pay

## 2021-08-23 NOTE — Progress Notes (Signed)
Chronic Care Management Pharmacy Assistant   Name: Anna Hayes  MRN: [id number] DOB: August 28, 1951   Reason for Encounter: Disease State/Hypertension Adherence Call   Conditions to be addressed/monitored: Hypertension  Recent office visits:   07/23/2021-Smith, Luane School, LPN (PCP) Medicare Annual Wellness exam. No medication changes noted   Recent consult visits:  None   Hospital visits:  None in previous 6 months   Recent Office Vitals: BP Readings from Last 3 Encounters:  06/02/21 (!) 146/78  03/01/21 122/72  01/04/21 136/64   Pulse Readings from Last 3 Encounters:  06/02/21 70  03/01/21 68  01/04/21 72    Wt Readings from Last 3 Encounters:  06/02/21 192 lb (87.1 kg)  03/01/21 192 lb (87.1 kg)  01/04/21 190 lb (86.2 kg)     Kidney Function Lab Results  Component Value Date/Time   CREATININE 0.92 06/09/2021 02:30 PM   CREATININE 1.00 06/02/2021 03:07 PM   CREATININE 0.47 (L) 08/31/2017 05:20 PM   GFRNONAA 60 08/19/2020 01:41 PM   GFRNONAA 101 08/31/2017 05:20 PM   GFRAA 70 08/19/2020 01:41 PM   GFRAA 118 08/31/2017 05:20 PM    BMP Latest Ref Rng & Units 06/09/2021 06/02/2021 03/01/2021  Glucose 65 - 99 mg/dL 95 93 96  BUN 8 - 27 mg/dL 14 16 13   Creatinine 0.57 - 1.00 mg/dL 0.92 1.00 0.78  BUN/Creat Ratio 12 - 28 15 16 17   Sodium 134 - 144 mmol/L 142 139 139  Potassium 3.5 - 5.2 mmol/L 5.1 5.4(H) 5.2  Chloride 96 - 106 mmol/L 106 100 104  CO2 20 - 29 mmol/L 20 23 21   Calcium 8.7 - 10.3 mg/dL 9.4 10.0 9.6     Current antihypertensive regimen:  Lisinopril 20mg    Patient verbally confirms she is taking the above medications as directed. Yes  How often are you checking your Blood Pressure? daily  she checks her blood pressure in the morning after taking her medication.  Current home BP readings: patient does record readings however she missed placed her reading log at the time of our call.   Wrist or arm cuff: wrist Caffeine intake: 2  cups Salt intake: a little OTC medications including pseudoephedrine or NSAIDs? No  Any readings above 180/120? No If yes any symptoms of hypertensive emergency?    What recent interventions/DTPs have been made by any provider to improve Blood Pressure control since last CPP Visit: patient was instructed to increase exercise. .  Any recent hospitalizations or ED visits since last visit with CPP? No  What diet changes have been made to improve Blood Pressure Control?  Low sodium intake  What exercise is being done to improve your Blood Pressure Control?  Patient has improved her exercise routine by walking daily. She plans to go back to [facility] in January   Adherence Review: Is the patient currently on ACE/ARB medication? Yes Does the patient have >5 day gap between last estimated fill dates? No  Medications: Outpatient Encounter Medications as of 08/23/2021  Medication Sig   atenolol (TENORMIN) 50 MG tablet TAKE 1 TABLET BY MOUTH EVERY DAY   butalbital-acetaminophen-caffeine (FIORICET) 50-325-40 MG tablet Take 1 tablet by mouth every 6 (six) hours as needed for migraine.   cyanocobalamin (,VITAMIN B-12,) 1000 MCG/ML injection Inject into the muscle.   furosemide (LASIX) 20 MG tablet Take 20 mg by mouth daily as needed for fluid.    levothyroxine (SYNTHROID) 25 MCG tablet Take 1 tablet (25 mcg total) by mouth daily.  LINZESS 145 MCG CAPS capsule TAKE 1 CAPSULE BY MOUTH DAILY BEFORE BREAKFAST.   lisinopril (ZESTRIL) 20 MG tablet Take 1 tablet (20 mg total) by mouth daily.   omega-3 acid ethyl esters (LOVAZA) 1 g capsule TAKE 2 CAPSULES BY MOUTH 2 TIMES DAILY.   omeprazole (PRILOSEC) 40 MG capsule TAKE 1 CAPSULE BY MOUTH EVERY DAY   promethazine (PHENERGAN) 25 MG tablet Take 1 tablet (25 mg total) by mouth every 8 (eight) hours as needed.   rivaroxaban (XARELTO) 20 MG TABS tablet Take 1 tablet (20 mg total) by mouth daily with supper.   simvastatin (ZOCOR) 40 MG tablet Take one tablet  by mouth nightly   No facility-administered encounter medications on file as of 08/23/2021.    Care Gaps:  TETANUS/TDAP Never done Zoster Vaccines- Shingrix (1 of 2) Never done COVID-19 Vaccine (4 - Booster for Pfizer series) 10/22/2020 MAMMOGRAM Completed 11/2020 repeat every year Pneumonia Vaccine 38+ Years old  Completed INFLUENZA VACCINE Completed DEXA SCAN  Completed 11/2019 Hepatitis C Screening Completed HPV VACCINES Aged Out  TETANUS/TDAP  Never done Zoster Vaccines- Shingrix (1 of 2) Never done COVID-19 Vaccine (4 - Booster for Pfizer series) 10/22/2020 COLONOSCOPY (Pts 45-109yrs Insurance coverage will need to be confirmed)  11/25/2020   Star Rating Drugs:  Lisinopril 20 mg- Last filled on 07/03/2021 for 90 day supply from [facility] Simvastatin 40 mg- Last filled on 06/02/2021 for 90 day supply from [facility] Pharmacist Assistant [phone]

## 2021-09-08 ENCOUNTER — Other Ambulatory Visit: Payer: Self-pay | Admitting: Nurse Practitioner

## 2021-09-08 ENCOUNTER — Encounter: Payer: Self-pay | Admitting: Nurse Practitioner

## 2021-09-08 ENCOUNTER — Ambulatory Visit (INDEPENDENT_AMBULATORY_CARE_PROVIDER_SITE_OTHER): Payer: Medicare HMO | Admitting: Nurse Practitioner

## 2021-09-08 ENCOUNTER — Ambulatory Visit (INDEPENDENT_AMBULATORY_CARE_PROVIDER_SITE_OTHER): Payer: Medicare HMO

## 2021-09-08 ENCOUNTER — Other Ambulatory Visit: Payer: Self-pay

## 2021-09-08 VITALS — BP 152/88 | HR 70 | Temp 95.9°F | Ht 62.5 in

## 2021-09-08 DIAGNOSIS — Z89612 Acquired absence of left leg above knee: Secondary | ICD-10-CM

## 2021-09-08 DIAGNOSIS — G8194 Hemiplegia, unspecified affecting left nondominant side: Secondary | ICD-10-CM

## 2021-09-08 DIAGNOSIS — F17218 Nicotine dependence, cigarettes, with other nicotine-induced disorders: Secondary | ICD-10-CM

## 2021-09-08 DIAGNOSIS — E038 Other specified hypothyroidism: Secondary | ICD-10-CM | POA: Diagnosis not present

## 2021-09-08 DIAGNOSIS — Z23 Encounter for immunization: Secondary | ICD-10-CM

## 2021-09-08 DIAGNOSIS — E782 Mixed hyperlipidemia: Secondary | ICD-10-CM

## 2021-09-08 DIAGNOSIS — I739 Peripheral vascular disease, unspecified: Secondary | ICD-10-CM | POA: Diagnosis not present

## 2021-09-08 DIAGNOSIS — I10 Essential (primary) hypertension: Secondary | ICD-10-CM

## 2021-09-08 DIAGNOSIS — Z7901 Long term (current) use of anticoagulants: Secondary | ICD-10-CM

## 2021-09-08 DIAGNOSIS — Z1231 Encounter for screening mammogram for malignant neoplasm of breast: Secondary | ICD-10-CM

## 2021-09-08 DIAGNOSIS — Z993 Dependence on wheelchair: Secondary | ICD-10-CM

## 2021-09-08 MED ORDER — LISINOPRIL 20 MG PO TABS: 20.0000 mg | ORAL_TABLET | Freq: Every day | ORAL | 3 refills | Status: DC

## 2021-09-08 NOTE — Progress Notes (Signed)
° °  Covid-19 Vaccination Clinic  Name:  Anna Hayes    MRN: [id number] DOB: 07-May-1951  09/08/2021  [name] was observed post Covid-19 immunization for 15 minutes without incident. She was provided with Vaccine Information Sheet and instruction to access the V-Safe system.   [name] was instructed to call 911 with any severe reactions post vaccine: Difficulty breathing  Swelling of face and throat  A fast heartbeat  A bad rash all over body  Dizziness and weakness   Immunizations Administered     Name Date Dose VIS Date Route   Pfizer Covid-19 Vaccine Bivalent Booster 09/08/2021  3:14 PM 0.3 mL 05/26/2021 Intramuscular   Manufacturer: [place]   Lot: [id number]   [place]: [phone]

## 2021-09-08 NOTE — Patient Instructions (Signed)
Continue medications as prescribed We will call you with lab results and appointment with Dr Lyda Jester for colonscopy Follow-up in 35-months     Safety In The Home  Use a variety of textures, such as Velcro, rubber bands and raised dots to provide tactile clues.  Apply to the on/off controls on appliances, at the end of the banister, or on medicine bottles.  Flooring  The following suggestions can help reduce the risk of a fall: Repair or replace torn carpet because a foot, cane or walker can easily get caught. Remove area carpets or throw rugs, especially if your loved one has a shuffling gait or uses a walker. When rugs  Or carpeting cannot be eliminated, place non-skid padding under rugs or secure to floor with double sided tape.  Area carpets without padding or tape can easily buckle underneath when walked on, causing a person to slip or fall. Use only matte, non-shiny finishes on the floor. Doorsills can be tripping hazards.  Remove them whenever possible or paint them a contrasting color. Eliminate low furniture that is easy to trip over such as coffee tables and footstools. Move furniture against walls to create a large area of uncluttered space in the center of the room.  However, if you are rearranging a room for someone else, discuss beforehand with that person.  Many individuals rely on specific locations of furniture to find their way around a room. It is easier to see the sofa or chair when its color contrasts with that of the flooring.  Choose a fabric that contrasts with the floor material or use a bright colored piping along the edges of the seat cushion. Reduce glare on polished furniture by covering it with a large doily or tablecloth.

## 2021-09-08 NOTE — Progress Notes (Signed)
Subjective:  Patient ID: Anna Hayes, female    DOB: [birth year]  Age: 70 y.o. MRN: [id number]  Chief Complaint  Patient presents with   Hypertension   Hyperlipidemia   Hypothyroidism    HPI  Anna Hayes is a 70 year old African-American female that presents for follow-up of hypertension, hyperlipidemia, and hypothyroidism. She denies any acute medical problems today. She has requested COVID-19 booster.  Hypertension, follow-up: She was last seen for hypertension 3 months ago.  BP at that visit was 146/78. Management includes Atenolol 50 mg, Lisinopril 20 mg.  She reports excellent compliance with treatment. She is not having side effects.  She is following a Low Sodium diet. She is not exercising. She does smoke.  Use of agents associated with hypertension: thyroid hormones.   Outside blood pressures are not being checked. Symptoms: No chest pain No chest pressure  No palpitations No syncope  No dyspnea No orthopnea  No paroxysmal nocturnal dyspnea No lower extremity edema   Pertinent labs: Lab Results  Component Value Date   CHOL 134 06/02/2021   HDL 43 06/02/2021   LDLCALC 66 06/02/2021   TRIG 145 06/02/2021   CHOLHDL 3.1 06/02/2021   Lab Results  Component Value Date   NA 142 06/09/2021   K 5.1 06/09/2021   CREATININE 0.92 06/09/2021   EGFR 66 06/09/2021   GFRNONAA 60 08/19/2020   GLUCOSE 95 06/09/2021     The ASCVD Risk score (Arnett DK, et al., 2019) failed to calculate for the following reasons:   The patient has a prior MI or stroke diagnosis    Lipid/Cholesterol, Follow-up  Last lipid panel Other pertinent labs  Lab Results  Component Value Date   CHOL 134 06/02/2021   HDL 43 06/02/2021   LDLCALC 66 06/02/2021   TRIG 145 06/02/2021   CHOLHDL 3.1 06/02/2021   Lab Results  Component Value Date   ALT 24 06/09/2021   AST 24 06/09/2021   PLT 464 (H) 06/02/2021   TSH 3.240 06/02/2021     She was last seen for this 3 months ago.  Management  includes Lovaza 2 gm BID, Zocor 40 mg QHS.  She reports excellent compliance with treatment. She is not having side effects.   Symptoms: No chest pain No chest pressure/discomfort  No dyspnea No lower extremity edema  No numbness or tingling of extremity No orthopnea  No palpitations No paroxysmal nocturnal dyspnea  No speech difficulty No syncope   Current diet: well balanced Current exercise: none  Hypothyroidism, follow-up: Anna Hayes has a past history of hypothyroidism for several years. Current treatment is Levothyroxine 25 mcg daily. Last TSH 3.240 on 06/02/21. She denies any current symptoms of hypothyroidism. She is adherent to medication regimen and follow-up appt.   Current Outpatient Medications on File Prior to Visit  Medication Sig Dispense Refill   atenolol (TENORMIN) 50 MG tablet TAKE 1 TABLET BY MOUTH EVERY DAY 90 tablet 1   butalbital-acetaminophen-caffeine (FIORICET) 50-325-40 MG tablet Take 1 tablet by mouth every 6 (six) hours as needed for migraine. 30 tablet 2   cyanocobalamin (,VITAMIN B-12,) 1000 MCG/ML injection Inject into the muscle.     furosemide (LASIX) 20 MG tablet Take 20 mg by mouth daily as needed for fluid.      levothyroxine (SYNTHROID) 25 MCG tablet Take 1 tablet (25 mcg total) by mouth daily. 90 tablet 2   LINZESS 145 MCG CAPS capsule TAKE 1 CAPSULE BY MOUTH DAILY BEFORE BREAKFAST. 90 capsule 1   lisinopril (ZESTRIL)  20 MG tablet Take 1 tablet (20 mg total) by mouth daily. 90 tablet 3   omega-3 acid ethyl esters (LOVAZA) 1 g capsule TAKE 2 CAPSULES BY MOUTH 2 TIMES DAILY. 360 capsule 1   omeprazole (PRILOSEC) 40 MG capsule TAKE 1 CAPSULE BY MOUTH EVERY DAY 90 capsule 1   promethazine (PHENERGAN) 25 MG tablet Take 1 tablet (25 mg total) by mouth every 8 (eight) hours as needed. 60 tablet 0   rivaroxaban (XARELTO) 20 MG TABS tablet Take 1 tablet (20 mg total) by mouth daily with supper. 90 tablet 1   simvastatin (ZOCOR) 40 MG tablet Take one tablet by  mouth nightly 90 tablet 0   No current facility-administered medications on file prior to visit.   Past Medical History:  Diagnosis Date   Anemia    Constipation    DVT of lower extremity (deep venous thrombosis) (HCC)    Frequent headaches    Heart murmur    found during pregnancy -  no problems   Hypertension    Muscle pain    Palpitations    Septic thrombophlebitis 08/31/2017   Sinus problem    Stroke [facility])    Past Surgical History:  Procedure Laterality Date   AMPUTATION Left 07/05/2017   Procedure: LEFT ABOVE KNEE AMPUTATION;  Surgeon: Conrad Covington, MD;  Location: [place];  Service: Vascular;  Laterality: Left;   CATARACT EXTRACTION W/PHACO Right 06/12/2018   Procedure: CATARACT EXTRACTION WITH INTRAOCULAR LENS IMPLANT;  Surgeon: Jalene Mullet, MD;  Location: [place];  Service: Ophthalmology;  Laterality: Right;   COLONOSCOPY  11/26/2015   Dr Lyda Jester - follow-up in 5 years   EMBOLECTOMY Bilateral 06/27/2017   Procedure: BILATERAL FEMORAL POPLITEAL EMBOLECTOMY;  Surgeon: Serafina Mitchell, MD;  Location[facility];  Service: Vascular;  Laterality: Bilateral;   FASCIOTOMY Left 06/27/2017   Procedure: FULL COMPARTMENT LEFT LOWER LEG FASCIOTOMY;  Surgeon: Serafina Mitchell, MD;  Location: [place];  Service: Vascular;  Laterality: Left;   PARTIAL HYSTERECTOMY     PATCH ANGIOPLASTY Left 06/27/2017   Procedure: PATCH ANGIOPLASTY Left Posterior Tibial Artery;  Surgeon: Serafina Mitchell, MD;  Location: [place];  Service: Vascular;  Laterality: Left;   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 07/11/2017   Procedure: REPAIR OF COMPLEX TRACTION RETINAL DETACHMENT WITH ENDO LASER AND ANTIFUNGAL INJECTION;  Surgeon: Jalene Mullet, MD;  Location: [place];  Service: Ophthalmology;  Laterality: Right;   TEE WITHOUT CARDIOVERSION N/A 07/11/2017   Procedure: TRANSESOPHAGEAL ECHOCARDIOGRAM (TEE);  Surgeon: Acie Fredrickson Wonda Cheng, MD;  Location: [facility] ENDOSCOPY;  Service: Cardiovascular;  Laterality:  N/A;    Family History  Problem Relation Age of Onset   Breast cancer Mother    Prostate cancer Father    Prostate cancer Brother    Social History   Socioeconomic History   Marital status: Widowed    Spouse name: Not on file   Number of children: Not on file   Years of education: Not on file   Highest education level: Not on file  Occupational History   Not on file  Tobacco Use   Smoking status: Some Days    Packs/day: 0.25    Years: 53.00    Pack years: 13.25    Types: Cigarettes   Smokeless tobacco: Never  Vaping Use   Vaping Use: Never used  Substance and Sexual Activity   Alcohol use: No   Drug use: No   Sexual activity: Not Currently  Other Topics Concern  Not on file  Social History Narrative   Lives with her daughter   Social Determinants of Health   Financial Resource Strain: Low Risk    Difficulty of Paying Living Expenses: Not hard at all  Food Insecurity: No Food Insecurity   Worried About Charity fundraiser in the Last Year: Never true   Arboriculturist in the Last Year: Never true  Transportation Needs: No Transportation Needs   Lack of Transportation (Medical): No   Lack of Transportation (Non-Medical): No  Physical Activity: Not on file  Stress: Not on file  Social Connections: Not on file    Review of Systems  Constitutional:  Negative for chills, fatigue and fever.  HENT:  Positive for congestion. Negative for ear pain, postnasal drip, rhinorrhea, sinus pressure, sinus pain and sore throat.   Respiratory:  Negative for cough and shortness of breath.   Cardiovascular:  Negative for chest pain.  Gastrointestinal:  Negative for abdominal pain, constipation, diarrhea, nausea and vomiting.  Genitourinary:  Negative for dysuria and urgency.  Musculoskeletal:  Negative for back pain and myalgias.  Neurological:  Positive for headaches. Negative for dizziness, weakness and light-headedness.  Psychiatric/Behavioral:  Negative for dysphoric mood.  The patient is not nervous/anxious.     Objective:  BP (!) 152/88    Pulse 70    Temp (!) 95.9 F (35.5 C)    Ht 5' 2.5" (1.588 m)    SpO2 99%    BMI 34.56 kg/m   BP/Weight 09/08/2021 10/01/9676 05/29/8100  Systolic BP 751 025 852  Diastolic BP 88 78 72  Wt. (Lbs) - 192 192  BMI 34.56 35.12 35.12    Physical Exam Vitals reviewed.  Constitutional:      Appearance: Normal appearance.  HENT:     Head: Normocephalic.     Right Ear: Tympanic membrane normal.     Left Ear: Tympanic membrane normal.     Nose: Nose normal.     Mouth/Throat:     Mouth: Mucous membranes are moist.  Eyes:     Pupils: Pupils are equal, round, and reactive to light.  Cardiovascular:     Rate and Rhythm: Normal rate and regular rhythm.     Pulses: Normal pulses.     Heart sounds: Normal heart sounds.  Pulmonary:     Effort: Pulmonary effort is normal.     Breath sounds: Normal breath sounds.  Abdominal:     General: Bowel sounds are normal.     Palpations: Abdomen is soft.  Musculoskeletal:     Left hand: Deformity (contracted) present. Decreased strength of finger abduction.     Cervical back: Neck supple.     Comments: hemiparesis left arm/hand     Left Lower Extremity: Left leg is amputated above knee.  Skin:    General: Skin is warm and dry.     Capillary Refill: Capillary refill takes less than 2 seconds.  Neurological:     General: No focal deficit present.     Mental Status: She is alert and oriented to person, place, and time.  Psychiatric:        Mood and Affect: Mood normal.        Behavior: Behavior normal.       Lab Results  Component Value Date   WBC 9.1 06/02/2021   HGB 11.4 06/02/2021   HCT 35.2 06/02/2021   PLT 464 (H) 06/02/2021   GLUCOSE 95 06/09/2021   CHOL 134 06/02/2021   TRIG  145 06/02/2021   HDL 43 06/02/2021   LDLCALC 66 06/02/2021   ALT 24 06/09/2021   AST 24 06/09/2021   NA 142 06/09/2021   K 5.1 06/09/2021   CL 106 06/09/2021   CREATININE 0.92  06/09/2021   BUN 14 06/09/2021   CO2 20 06/09/2021   TSH 3.240 06/02/2021   INR 1.21 06/12/2018      Assessment & Plan:   1. Benign essential HTN - CBC with Differential/Platelet - Comprehensive metabolic panel  2. Mixed hyperlipidemia - Lipid panel  3. Secondary hypothyroidism - CBC with Differential/Platelet - Comprehensive metabolic panel  4. Encounter for screening mammogram for malignant neoplasm of breast - Ambulatory referral to Gastroenterology  5. Hx of AKA (above knee amputation), left ([place]) -assist with ADLs -use assistive devices for transfers and transport -fall precaustions  6. Left hemiplegia (HCC) - CBC with Differential/Platelet - Lipid panel -assist with ADLs  7. Long term (current) use of anticoagulants - CBC with Differential/Platelet -continue Xarelto 20 mg QD  8. PVD (peripheral vascular disease) (HCC) - CBC with Differential/Platelet - Lipid panel - Comprehensive metabolic panel -continue Xarelto 20 mg daily  9. Wheelchair dependent -assist with ADLs  -fall precautions  10. Cigarette nicotine dependence with other nicotine-induced disorder  -recommend smoking cessation  Continue medications as prescribed We will call you with lab results and appointment with Dr Lyda Jester for colonscopy Follow-up in 56-month      Follow-up: 357-month fasting  An After Visit Summary was printed and given to the patient.  I, ShRip HarbourNP, have reviewed all documentation for this visit. The documentation on 09/08/21 for the exam, diagnosis, procedures, and orders are all accurate and complete.    Signed, ShRip HarbourNP [id number]

## 2021-09-09 LAB — CBC WITH DIFFERENTIAL/PLATELET
Basophils Absolute: 0 x10E3/uL (ref 0.0–0.2)
Basos: 1 %
EOS (ABSOLUTE): 0.3 x10E3/uL (ref 0.0–0.4)
Eos: 4 %
Hematocrit: 34.9 % (ref 34.0–46.6)
Hemoglobin: 11.7 g/dL (ref 11.1–15.9)
Immature Grans (Abs): 0 x10E3/uL (ref 0.0–0.1)
Immature Granulocytes: 0 %
Lymphocytes Absolute: 2.9 x10E3/uL (ref 0.7–3.1)
Lymphs: 33 %
MCH: 26.3 pg — ABNORMAL LOW (ref 26.6–33.0)
MCHC: 33.5 g/dL (ref 31.5–35.7)
MCV: 78 fL — ABNORMAL LOW (ref 79–97)
Monocytes Absolute: 1.1 x10E3/uL — ABNORMAL HIGH (ref 0.1–0.9)
Monocytes: 12 %
Neutrophils Absolute: 4.4 x10E3/uL (ref 1.4–7.0)
Neutrophils: 50 %
Platelets: 470 x10E3/uL — ABNORMAL HIGH (ref 150–450)
RBC: 4.45 x10E6/uL (ref 3.77–5.28)
RDW: 15.6 % — ABNORMAL HIGH (ref 11.7–15.4)
WBC: 8.7 x10E3/uL (ref 3.4–10.8)

## 2021-09-09 LAB — COMPREHENSIVE METABOLIC PANEL
ALT: 21 IU/L (ref 0–32)
AST: 22 IU/L (ref 0–40)
Albumin/Globulin Ratio: 1.4 (ref 1.2–2.2)
Albumin: 4.4 g/dL (ref 3.7–4.7)
Alkaline Phosphatase: 83 IU/L (ref 44–121)
BUN/Creatinine Ratio: 14 (ref 12–28)
BUN: 14 mg/dL (ref 8–27)
Bilirubin Total: 0.2 mg/dL (ref 0.0–1.2)
CO2: 23 mmol/L (ref 20–29)
Calcium: 9.7 mg/dL (ref 8.7–10.3)
Chloride: 100 mmol/L (ref 96–106)
Creatinine, Ser: 1.03 mg/dL — ABNORMAL HIGH (ref 0.57–1.00)
Globulin, Total: 3.1 g/dL (ref 1.5–4.5)
Glucose: 90 mg/dL (ref 70–99)
Potassium: 5.5 mmol/L — ABNORMAL HIGH (ref 3.5–5.2)
Sodium: 136 mmol/L (ref 134–144)
Total Protein: 7.5 g/dL (ref 6.0–8.5)
eGFR: 58 mL/min/{1.73_m2} — ABNORMAL LOW (ref >59)

## 2021-09-09 LAB — LIPID PANEL
Chol/HDL Ratio: 3.5 ratio (ref 0.0–4.4)
Cholesterol, Total: 163 mg/dL (ref 100–199)
HDL: 46 mg/dL
LDL Chol Calc (NIH): 81 mg/dL (ref 0–99)
Triglycerides: 215 mg/dL — ABNORMAL HIGH (ref 0–149)
VLDL Cholesterol Cal: 36 mg/dL (ref 5–40)

## 2021-09-09 LAB — CARDIOVASCULAR RISK ASSESSMENT

## 2021-09-25 ENCOUNTER — Other Ambulatory Visit: Payer: Self-pay | Admitting: Physician Assistant

## 2021-09-25 DIAGNOSIS — I10 Essential (primary) hypertension: Secondary | ICD-10-CM

## 2021-09-25 NOTE — Telephone Encounter (Signed)
Your pt

## 2021-09-29 ENCOUNTER — Telehealth: Payer: Self-pay

## 2021-09-29 NOTE — Progress Notes (Addendum)
Chronic Care Management Pharmacy Assistant   Name: Anna Hayes  MRN: [id number] DOB: [birth year]   Reason for Encounter: Disease State/Hypertension Adherence Call   Recent office visits:   09/08/2021 Jerrell Belfast NP (PCP)     Recent consult visits: None   Hospital visits:  None in previous 6 months  Medications: Outpatient Encounter Medications as of 09/29/2021  Medication Sig   atenolol (TENORMIN) 50 MG tablet TAKE 1 TABLET BY MOUTH EVERY DAY   butalbital-acetaminophen-caffeine (FIORICET) 50-325-40 MG tablet Take 1 tablet by mouth every 6 (six) hours as needed for migraine.   cyanocobalamin (,VITAMIN B-12,) 1000 MCG/ML injection Inject into the muscle.   furosemide (LASIX) 20 MG tablet Take 20 mg by mouth daily as needed for fluid.    levothyroxine (SYNTHROID) 25 MCG tablet Take 1 tablet (25 mcg total) by mouth daily.   LINZESS 145 MCG CAPS capsule TAKE 1 CAPSULE BY MOUTH DAILY BEFORE BREAKFAST.   lisinopril (ZESTRIL) 20 MG tablet Take 1 tablet (20 mg total) by mouth daily.   omega-3 acid ethyl esters (LOVAZA) 1 g capsule TAKE 2 CAPSULES BY MOUTH 2 TIMES DAILY.   omeprazole (PRILOSEC) 40 MG capsule TAKE 1 CAPSULE BY MOUTH EVERY DAY   promethazine (PHENERGAN) 25 MG tablet Take 1 tablet (25 mg total) by mouth every 8 (eight) hours as needed.   rivaroxaban (XARELTO) 20 MG TABS tablet Take 1 tablet (20 mg total) by mouth daily with supper.   simvastatin (ZOCOR) 40 MG tablet Take one tablet by mouth nightly   No facility-administered encounter medications on file as of 09/29/2021.    Recent Office Vitals: BP Readings from Last 3 Encounters:  09/08/21 (!) 152/88  06/02/21 (!) 146/78  03/01/21 122/72   Pulse Readings from Last 3 Encounters:  09/08/21 70  06/02/21 70  03/01/21 68    Wt Readings from Last 3 Encounters:  06/02/21 192 lb (87.1 kg)  03/01/21 192 lb (87.1 kg)  01/04/21 190 lb (86.2 kg)     Kidney Function Lab Results  Component Value Date/Time    CREATININE 1.03 (H) 09/08/2021 03:18 PM   CREATININE 0.92 06/09/2021 02:30 PM   CREATININE 0.47 (L) 08/31/2017 05:20 PM   GFRNONAA 60 08/19/2020 01:41 PM   GFRNONAA 101 08/31/2017 05:20 PM   GFRAA 70 08/19/2020 01:41 PM   GFRAA 118 08/31/2017 05:20 PM    BMP Latest Ref Rng & Units 09/08/2021 06/09/2021 06/02/2021  Glucose 70 - 99 mg/dL 90 95 93  BUN 8 - 27 mg/dL 14 14 16   Creatinine 0.57 - 1.00 mg/dL 1.03(H) 0.92 1.00  BUN/Creat Ratio 12 - 28 14 15 16   Sodium 134 - 144 mmol/L 136 142 139  Potassium 3.5 - 5.2 mmol/L 5.5(H) 5.1 5.4(H)  Chloride 96 - 106 mmol/L 100 106 100  CO2 20 - 29 mmol/L 23 20 23   Calcium 8.7 - 10.3 mg/dL 9.7 9.4 10.0     Current antihypertensive regimen:  Lisinopril 40 mg   Patient verbally confirms she is taking the above medications as directed. Yes  How often are you checking your Blood Pressure? daily  she checks her blood pressure in the morning before taking her medication.  Current home BP readings: Patient does not keep a record of her BP reading.   Wrist or arm cuff: wrist cuff Caffeine intake: 2 cups Salt intake: none OTC medications including pseudoephedrine or NSAIDs none  Any readings above 180/120? No If yes any symptoms of hypertensive emergency?  What recent interventions/DTPs have been made by any provider to improve Blood Pressure control since last CPP Visit: Patient was instructed to decrease salt intake and increase exercise.  Any recent hospitalizations or ED visits since last visit with CPP? No  What diet changes have been made to improve Blood Pressure Control?  Patient has been watching her sodium intake  What exercise is being done to improve your Blood Pressure Control?  Patient has improved her exercise routine by walking daily.   Adherence Review: Is the patient currently on ACE/ARB medication? Yes Does the patient have >5 day gap between last estimated fill dates? No   Care Gaps: Last annual wellness visit-   07/23/2021 COVID-19 Vaccine (4 - Booster for Pfizer series)    10/22/2020 MAMMOGRAM Completed 11/2020  Pneumonia Vaccine 85+ Years old   Completed DEXA SCAN  Completed 11/2019 Hepatitis C Screening Completed HPV VACCINES Aged Out  TETANUS/TDAP- Never done Zoster Vaccines- Shingrix (1 of 2)- Never done COVID-19 Vaccine (4 - Booster for Pfizer series)    10/22/2020 COLONOSCOPY (Pts 45-17yrs  11/25/2020   Star Rating Drugs:  Medication:  Last Fill: Day Supply   Lisinopril 20 mg              09/07, 11/14            90 DS  Simvastatin 40 mg          06/28, 10/08              90 DS  Cherlyn Labella Clinical Pharmacist Assistant [phone]

## 2021-09-30 ENCOUNTER — Other Ambulatory Visit: Payer: Self-pay

## 2021-09-30 DIAGNOSIS — E782 Mixed hyperlipidemia: Secondary | ICD-10-CM

## 2021-09-30 MED ORDER — SIMVASTATIN 40 MG PO TABS: ORAL_TABLET | ORAL | 0 refills | Status: DC

## 2021-10-04 DIAGNOSIS — S78112A Complete traumatic amputation at level between left hip and knee, initial encounter: Secondary | ICD-10-CM | POA: Diagnosis not present

## 2021-10-04 DIAGNOSIS — G5792 Unspecified mononeuropathy of left lower limb: Secondary | ICD-10-CM | POA: Diagnosis not present

## 2021-10-04 DIAGNOSIS — I739 Peripheral vascular disease, unspecified: Secondary | ICD-10-CM | POA: Diagnosis not present

## 2021-10-04 DIAGNOSIS — Z7409 Other reduced mobility: Secondary | ICD-10-CM | POA: Diagnosis not present

## 2021-10-04 DIAGNOSIS — I69898 Other sequelae of other cerebrovascular disease: Secondary | ICD-10-CM | POA: Diagnosis not present

## 2021-10-04 DIAGNOSIS — I69354 Hemiplegia and hemiparesis following cerebral infarction affecting left non-dominant side: Secondary | ICD-10-CM | POA: Diagnosis not present

## 2021-10-07 ENCOUNTER — Telehealth: Payer: Self-pay

## 2021-10-07 NOTE — Progress Notes (Signed)
A user error has taken place: encounter opened in error, closed for administrative reasons  .  [place] Pharmacist Assistant [phone]

## 2021-10-08 ENCOUNTER — Other Ambulatory Visit: Payer: Self-pay

## 2021-10-08 DIAGNOSIS — R112 Nausea with vomiting, unspecified: Secondary | ICD-10-CM

## 2021-10-08 MED ORDER — PROMETHAZINE HCL 25 MG PO TABS: 25.0000 mg | ORAL_TABLET | Freq: Three times a day (TID) | ORAL | 0 refills | Status: DC | PRN

## 2021-10-26 ENCOUNTER — Other Ambulatory Visit: Payer: Self-pay

## 2021-10-27 MED ORDER — RIVAROXABAN 20 MG PO TABS: 20.0000 mg | ORAL_TABLET | Freq: Every day | ORAL | 1 refills | Status: DC

## 2021-10-27 MED ORDER — BUTALBITAL-APAP-CAFFEINE 50-325-40 MG PO TABS: 1.0000 | ORAL_TABLET | Freq: Four times a day (QID) | ORAL | 2 refills | Status: DC | PRN

## 2021-10-28 ENCOUNTER — Other Ambulatory Visit: Payer: Self-pay | Admitting: Family Medicine

## 2021-10-28 NOTE — Telephone Encounter (Signed)
Refill sent to pharmacy.   

## 2021-11-10 DIAGNOSIS — K5730 Diverticulosis of large intestine without perforation or abscess without bleeding: Secondary | ICD-10-CM | POA: Diagnosis not present

## 2021-11-10 DIAGNOSIS — K219 Gastro-esophageal reflux disease without esophagitis: Secondary | ICD-10-CM | POA: Diagnosis not present

## 2021-11-10 DIAGNOSIS — K5900 Constipation, unspecified: Secondary | ICD-10-CM | POA: Diagnosis not present

## 2021-11-18 ENCOUNTER — Telehealth: Payer: Self-pay

## 2021-11-18 NOTE — Chronic Care Management (AMB) (Signed)
° ° °  Chronic Care Management Pharmacy Assistant   Name: Anna Hayes  MRN: [id number] DOB: [birth year]  Reason for Encounter: Disease State/ Hypertension  Recent office visits:  None  Recent consult visits:  None  Hospital visits:  None in previous 6 months  Medications: Outpatient Encounter Medications as of 11/18/2021  Medication Sig   atenolol (TENORMIN) 50 MG tablet TAKE 1 TABLET BY MOUTH EVERY DAY   butalbital-acetaminophen-caffeine (FIORICET) 50-325-40 MG tablet Take 1 tablet by mouth every 6 (six) hours as needed for migraine.   cyanocobalamin (,VITAMIN B-12,) 1000 MCG/ML injection Inject into the muscle.   furosemide (LASIX) 20 MG tablet Take 20 mg by mouth daily as needed for fluid.    levothyroxine (SYNTHROID) 25 MCG tablet Take 1 tablet (25 mcg total) by mouth daily.   LINZESS 145 MCG CAPS capsule TAKE 1 CAPSULE BY MOUTH EVERY DAY BEFORE BREAKFAST   lisinopril (ZESTRIL) 20 MG tablet Take 1 tablet (20 mg total) by mouth daily.   omega-3 acid ethyl esters (LOVAZA) 1 g capsule TAKE 2 CAPSULES BY MOUTH 2 TIMES DAILY.   omeprazole (PRILOSEC) 40 MG capsule TAKE 1 CAPSULE BY MOUTH EVERY DAY   promethazine (PHENERGAN) 25 MG tablet Take 1 tablet (25 mg total) by mouth every 8 (eight) hours as needed.   rivaroxaban (XARELTO) 20 MG TABS tablet Take 1 tablet (20 mg total) by mouth daily with supper.   simvastatin (ZOCOR) 40 MG tablet Take one tablet by mouth nightly   No facility-administered encounter medications on file as of 11/18/2021.   Recent Office Vitals: BP Readings from Last 3 Encounters:  09/08/21 (!) 152/88  06/02/21 (!) 146/78  03/01/21 122/72   Pulse Readings from Last 3 Encounters:  09/08/21 70  06/02/21 70  03/01/21 68    Wt Readings from Last 3 Encounters:  06/02/21 192 lb (87.1 kg)  03/01/21 192 lb (87.1 kg)  01/04/21 190 lb (86.2 kg)     Kidney Function Lab Results  Component Value Date/Time   CREATININE 1.03 (H) 09/08/2021 03:18 PM    CREATININE 0.92 06/09/2021 02:30 PM   CREATININE 0.47 (L) 08/31/2017 05:20 PM   GFRNONAA 60 08/19/2020 01:41 PM   GFRNONAA 101 08/31/2017 05:20 PM   GFRAA 70 08/19/2020 01:41 PM   GFRAA 118 08/31/2017 05:20 PM    BMP Latest Ref Rng & Units 09/08/2021 06/09/2021 06/02/2021  Glucose 70 - 99 mg/dL 90 95 93  BUN 8 - 27 mg/dL 14 14 16   Creatinine 0.57 - 1.00 mg/dL 1.03(H) 0.92 1.00  BUN/Creat Ratio 12 - 28 14 15 16   Sodium 134 - 144 mmol/L 136 142 139  Potassium 3.5 - 5.2 mmol/L 5.5(H) 5.1 5.4(H)  Chloride 96 - 106 mmol/L 100 106 100  CO2 20 - 29 mmol/L 23 20 23   Calcium 8.7 - 10.3 mg/dL 9.7 9.4 10.0    11-18-2021: 1st attempt. No VM 11-19-2021: 2nd attempt. No VM 11-22-2021: 3rd attempt no VM  Care Gaps: Last annual wellness visit? 07-23-2021 Tdap overdue Shingrix overdue Colonoscopy overdue  Star Rating Drugs: Lisinopril 20 mg- Last filled 09-08-2021 90 DS Simvastatin 40 mg- Last filled 09-30-2021 90 DS  [place] Clinical Pharmacist Assistant [phone]

## 2021-11-26 ENCOUNTER — Other Ambulatory Visit: Payer: Self-pay

## 2021-11-26 DIAGNOSIS — R112 Nausea with vomiting, unspecified: Secondary | ICD-10-CM

## 2021-11-28 MED ORDER — PROMETHAZINE HCL 25 MG PO TABS: 25.0000 mg | ORAL_TABLET | Freq: Three times a day (TID) | ORAL | 0 refills | Status: DC | PRN

## 2021-12-07 DIAGNOSIS — Z86010 Personal history of colonic polyps: Secondary | ICD-10-CM | POA: Diagnosis not present

## 2021-12-07 DIAGNOSIS — Z800 Family history of malignant neoplasm of digestive organs: Secondary | ICD-10-CM | POA: Diagnosis not present

## 2021-12-07 DIAGNOSIS — Z1211 Encounter for screening for malignant neoplasm of colon: Secondary | ICD-10-CM | POA: Diagnosis not present

## 2021-12-07 DIAGNOSIS — Z8673 Personal history of transient ischemic attack (TIA), and cerebral infarction without residual deficits: Secondary | ICD-10-CM | POA: Diagnosis not present

## 2021-12-07 DIAGNOSIS — D571 Sickle-cell disease without crisis: Secondary | ICD-10-CM | POA: Diagnosis not present

## 2021-12-07 DIAGNOSIS — K5730 Diverticulosis of large intestine without perforation or abscess without bleeding: Secondary | ICD-10-CM | POA: Diagnosis not present

## 2021-12-07 DIAGNOSIS — K635 Polyp of colon: Secondary | ICD-10-CM | POA: Diagnosis not present

## 2021-12-07 DIAGNOSIS — D124 Benign neoplasm of descending colon: Secondary | ICD-10-CM | POA: Diagnosis not present

## 2021-12-07 DIAGNOSIS — D122 Benign neoplasm of ascending colon: Secondary | ICD-10-CM | POA: Diagnosis not present

## 2021-12-07 DIAGNOSIS — Z7901 Long term (current) use of anticoagulants: Secondary | ICD-10-CM | POA: Diagnosis not present

## 2021-12-07 DIAGNOSIS — R69 Illness, unspecified: Secondary | ICD-10-CM | POA: Diagnosis not present

## 2021-12-07 LAB — HM COLONOSCOPY

## 2021-12-08 ENCOUNTER — Ambulatory Visit: Payer: Medicare HMO | Admitting: Nurse Practitioner

## 2021-12-14 ENCOUNTER — Ambulatory Visit (INDEPENDENT_AMBULATORY_CARE_PROVIDER_SITE_OTHER): Payer: Medicare HMO | Admitting: Nurse Practitioner

## 2021-12-14 ENCOUNTER — Other Ambulatory Visit: Payer: Self-pay

## 2021-12-14 ENCOUNTER — Telehealth: Payer: Self-pay

## 2021-12-14 ENCOUNTER — Encounter: Payer: Self-pay | Admitting: Nurse Practitioner

## 2021-12-14 VITALS — BP 134/70 | HR 65 | Temp 97.3°F | Ht 62.5 in | Wt 192.0 lb

## 2021-12-14 DIAGNOSIS — Z7901 Long term (current) use of anticoagulants: Secondary | ICD-10-CM

## 2021-12-14 DIAGNOSIS — S78112A Complete traumatic amputation at level between left hip and knee, initial encounter: Secondary | ICD-10-CM

## 2021-12-14 DIAGNOSIS — E782 Mixed hyperlipidemia: Secondary | ICD-10-CM

## 2021-12-14 DIAGNOSIS — I10 Essential (primary) hypertension: Secondary | ICD-10-CM | POA: Diagnosis not present

## 2021-12-14 DIAGNOSIS — D6869 Other thrombophilia: Secondary | ICD-10-CM

## 2021-12-14 DIAGNOSIS — J3089 Other allergic rhinitis: Secondary | ICD-10-CM

## 2021-12-14 DIAGNOSIS — F17218 Nicotine dependence, cigarettes, with other nicotine-induced disorders: Secondary | ICD-10-CM

## 2021-12-14 DIAGNOSIS — R110 Nausea: Secondary | ICD-10-CM

## 2021-12-14 DIAGNOSIS — Z780 Asymptomatic menopausal state: Secondary | ICD-10-CM

## 2021-12-14 DIAGNOSIS — I739 Peripheral vascular disease, unspecified: Secondary | ICD-10-CM

## 2021-12-14 DIAGNOSIS — Z1231 Encounter for screening mammogram for malignant neoplasm of breast: Secondary | ICD-10-CM

## 2021-12-14 DIAGNOSIS — E038 Other specified hypothyroidism: Secondary | ICD-10-CM | POA: Diagnosis not present

## 2021-12-14 DIAGNOSIS — G8114 Spastic hemiplegia affecting left nondominant side: Secondary | ICD-10-CM

## 2021-12-14 DIAGNOSIS — I6930 Unspecified sequelae of cerebral infarction: Secondary | ICD-10-CM | POA: Diagnosis not present

## 2021-12-14 DIAGNOSIS — Z13820 Encounter for screening for osteoporosis: Secondary | ICD-10-CM

## 2021-12-14 MED ORDER — FLUTICASONE PROPIONATE 50 MCG/ACT NA SUSP: 2.0000 | Freq: Every day | NASAL | 6 refills | Status: DC

## 2021-12-14 MED ORDER — ONDANSETRON HCL 8 MG PO TABS: 8.0000 mg | ORAL_TABLET | Freq: Three times a day (TID) | ORAL | 3 refills | Status: DC | PRN

## 2021-12-14 NOTE — Chronic Care Management (AMB) (Signed)
? ? ?Chronic Care Management ?Pharmacy Assistant  ? ?Name: Anna Hayes  MRN: [id number] DOB: 1950/12/22 ? ?Reason for Encounter: Disease State/ Hypertension ? ?Recent office visits:  ?None ? ?Recent consult visits:  ?None ? ?Hospital visits:  ?None in previous 6 months ? ?Medications: ?Outpatient Encounter Medications as of 12/14/2021  ?Medication Sig  ? atenolol (TENORMIN) 50 MG tablet TAKE 1 TABLET BY MOUTH EVERY DAY  ? butalbital-acetaminophen-caffeine (FIORICET) 50-325-40 MG tablet Take 1 tablet by mouth every 6 (six) hours as needed for migraine.  ? cyanocobalamin (,VITAMIN B-12,) 1000 MCG/ML injection Inject into the muscle.  ? furosemide (LASIX) 20 MG tablet Take 20 mg by mouth daily as needed for fluid.   ? levothyroxine (SYNTHROID) 25 MCG tablet Take 1 tablet (25 mcg total) by mouth daily.  ? LINZESS 145 MCG CAPS capsule TAKE 1 CAPSULE BY MOUTH EVERY DAY BEFORE BREAKFAST  ? lisinopril (ZESTRIL) 20 MG tablet Take 1 tablet (20 mg total) by mouth daily.  ? omega-3 acid ethyl esters (LOVAZA) 1 g capsule TAKE 2 CAPSULES BY MOUTH 2 TIMES DAILY.  ? omeprazole (PRILOSEC) 40 MG capsule TAKE 1 CAPSULE BY MOUTH EVERY DAY  ? promethazine (PHENERGAN) 25 MG tablet Take 1 tablet (25 mg total) by mouth every 8 (eight) hours as needed.  ? rivaroxaban (XARELTO) 20 MG TABS tablet Take 1 tablet (20 mg total) by mouth daily with supper.  ? simvastatin (ZOCOR) 40 MG tablet Take one tablet by mouth nightly  ? ?No facility-administered encounter medications on file as of 12/14/2021.  ? ?Recent Office Vitals: ?BP Readings from Last 3 Encounters:  ?09/08/21 (!) 152/88  ?06/02/21 (!) 146/78  ?03/01/21 122/72  ? ?Pulse Readings from Last 3 Encounters:  ?09/08/21 70  ?06/02/21 70  ?03/01/21 68  ?  ?Wt Readings from Last 3 Encounters:  ?06/02/21 192 lb (87.1 kg)  ?03/01/21 192 lb (87.1 kg)  ?01/04/21 190 lb (86.2 kg)  ?  ? ?Kidney Function ?Lab Results  ?Component Value Date/Time  ? CREATININE 1.03 (H) 09/08/2021 03:18 PM  ?  CREATININE 0.92 06/09/2021 02:30 PM  ? CREATININE 0.47 (L) 08/31/2017 05:20 PM  ? GFRNONAA 60 08/19/2020 01:41 PM  ? GFRNONAA 101 08/31/2017 05:20 PM  ? GFRAA 70 08/19/2020 01:41 PM  ? GFRAA 118 08/31/2017 05:20 PM  ? ? ?BMP Latest Ref Rng & Units 09/08/2021 06/09/2021 06/02/2021  ?Glucose 70 - 99 mg/dL 90 95 93  ?BUN 8 - 27 mg/dL '14 14 16  '$ ?Creatinine 0.57 - 1.00 mg/dL 1.03(H) 0.92 1.00  ?BUN/Creat Ratio 12 - '28 14 15 16  '$ ?Sodium 134 - 144 mmol/L 136 142 139  ?Potassium 3.5 - 5.2 mmol/L 5.5(H) 5.1 5.4(H)  ?Chloride 96 - 106 mmol/L 100 106 100  ?CO2 20 - 29 mmol/L '23 20 23  '$ ?Calcium 8.7 - 10.3 mg/dL 9.7 9.4 10.0  ? ? ? ?Current antihypertensive regimen:  ?Lisinopril 40 mg daily ?Atenolol 50 mg daily ? Patient verbally confirms she is taking the above medications as directed. Yes ? ?How often are you checking your Blood Pressure? 1-2x per week ? ?she checks her blood pressure in the morning after taking her medication. ? ?Current home BP readings: 132/74 ?Wrist or arm cuff: cuff ?Caffeine intake: Patient states she drinks 1 cup of coffee a few days weekly ?Salt intake: ?OTC medications including pseudoephedrine or NSAIDs? None ? ?Any readings above 180/120? No ? ?What recent interventions/DTPs have been made by any provider to improve Blood Pressure control since last CPP  Visit:  ?Educated on BP goals and benefits of medications for prevention of heart attack, stroke and kidney damage; Extensive time spent on counseling patient on blood pressure goal and impact of each antihypertensive medication on their blood pressure and risk reduction for CV disease.  Used analogies to explain the need for multiple antihypertensive medications to achieve BP goals and that it is often a silent disease with no symptoms.  ?-Counseled to monitor BP at home Weekly, document, and provide log at future appointments ? ?Any recent hospitalizations or ED visits since last visit with CPP? No ? ?What diet changes have been made to improve Blood  Pressure Control?  ?Patient stated she has limited her sodium intake and drinks plenty of water. ? ?What exercise is being done to improve your Blood Pressure Control?  ?Patient has improved her exercise routine by walking daily.  ? ?Adherence Review: ?Is the patient currently on ACE/ARB medication? Yes ?Does the patient have >5 day gap between last estimated fill dates? CPP to review ? ?Care Gaps: ?Last annual wellness visit? 07-23-2021 ?Tdap overdue ?Shingrix overdue ?Colonoscopy overdue ? ?Star Rating Drugs: ?Simvastatin 40 mg- Last filled 09-30-2021 90 DS ?Lisinopril 20 mg- Last filled 12-08-2021 90 DS ? ?Malecca Hicks CMA ?Clinical Pharmacist Assistant ?[phone] ? ?

## 2021-12-14 NOTE — Progress Notes (Signed)
? ?Subjective:  ?Patient ID: Anna Hayes, female    DOB: 28-May-1951  Age: 71 y.o. MRN: [id number] ? ?CC: ?Hypertension ?Hyperlipidemia ? ?HPI ? Anna Hayes is a 71 year old African-American female that presents for follow-up of hypertension, hyperlipidemia, and hypothyroidism. 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 She is in a manual wheelchair. Reports chronic allergic rhinitis has been problematic recently. She is due for DEXA and mammogram. Recent colonoscopy with Dr Lyda Jester, directed to repeat in 1-year. ? ?Hypertension, follow-up:  ?She was last seen for hypertension 3 months ago.  ?BP at that visit was 152/88. Management includes Lisinopril and Atenolol  ? ?She reports excellent compliance with treatment. ?She is not having side effects.  ?She is following a Regular diet. ?She is not exercising. ?She does smoke. ? ?Use of agents associated with hypertension: NSAIDS.  ? ?Symptoms: ?No chest pain No chest pressure  ?No palpitations No syncope  ?No dyspnea No orthopnea  ?No paroxysmal nocturnal dyspnea No lower extremity edema  ? ? ? ?Lipid/Cholesterol, Follow-up ? ?Last lipid panel Other pertinent labs  ?Lab Results  ?Component Value Date  ? CHOL 163 09/08/2021  ? HDL 46 09/08/2021  ? [place] 81 09/08/2021  ? TRIG 215 (H) 09/08/2021  ? CHOLHDL 3.5 09/08/2021  ? Lab Results  ?Component Value Date  ? ALT 21 09/08/2021  ? AST 22 09/08/2021  ? PLT 470 (H) 09/08/2021  ? TSH 3.240 06/02/2021  ?  ? ?She was last seen for this 3 months ago.  ?Management since that visit includes Omega 3 and Simvastatin. ? ?She reports excellent compliance with treatment. ?She is not having side effects.  ? ? ?Hypothyroidism, follow-up: ?Current treatment is Levothyroxine 25 mcg daily. Last TSH 3.240 on 06/02/21. She denies any current symptoms of hypothyroidism. She is adherent to medication regimen and follow-up appointments.  ? ?Current Outpatient  Medications on File Prior to Visit  ?Medication Sig Dispense Refill  ? atenolol (TENORMIN) 50 MG tablet TAKE 1 TABLET BY MOUTH EVERY DAY 90 tablet 1  ? butalbital-acetaminophen-caffeine (FIORICET) 50-325-40 MG tablet Take 1 tablet by mouth every 6 (six) hours as needed for migraine. 30 tablet 2  ? cyanocobalamin (,VITAMIN B-12,) 1000 MCG/ML injection Inject into the muscle.    ? furosemide (LASIX) 20 MG tablet Take 20 mg by mouth daily as needed for fluid.     ? levothyroxine (SYNTHROID) 25 MCG tablet Take 1 tablet (25 mcg total) by mouth daily. 90 tablet 2  ? LINZESS 145 MCG CAPS capsule TAKE 1 CAPSULE BY MOUTH EVERY DAY BEFORE BREAKFAST 90 capsule 1  ? lisinopril (ZESTRIL) 20 MG tablet Take 1 tablet (20 mg total) by mouth daily. 90 tablet 3  ? omega-3 acid ethyl esters (LOVAZA) 1 g capsule TAKE 2 CAPSULES BY MOUTH 2 TIMES DAILY. 360 capsule 1  ? omeprazole (PRILOSEC) 40 MG capsule TAKE 1 CAPSULE BY MOUTH EVERY DAY 90 capsule 1  ? promethazine (PHENERGAN) 25 MG tablet Take 1 tablet (25 mg total) by mouth every 8 (eight) hours as needed. 60 tablet 0  ? rivaroxaban (XARELTO) 20 MG TABS tablet Take 1 tablet (20 mg total) by mouth daily with supper. 90 tablet 1  ? simvastatin (ZOCOR) 40 MG tablet Take one tablet by mouth nightly 90 tablet 0  ? ?No current facility-administered medications on file prior to visit.  ? ?Past Medical History:  ?Diagnosis Date  ? Anemia   ? Constipation   ?  DVT of lower extremity (deep venous thrombosis) ([facility])   ? Frequent headaches   ? Heart murmur   ? found during pregnancy -  no problems  ? Hypertension   ? Muscle pain   ? Palpitations   ? Septic thrombophlebitis 08/31/2017  ? Sinus problem   ? Stroke [facility])   ? ?Past Surgical History:  ?Procedure Laterality Date  ? AMPUTATION Left 07/05/2017  ? Procedure: LEFT ABOVE KNEE AMPUTATION;  Surgeon: Conrad Drew, MD;  Location: [place];  Service: Vascular;  Laterality: Left;  ? CATARACT EXTRACTION W/PHACO Right 06/12/2018  ? Procedure: CATARACT  EXTRACTION WITH INTRAOCULAR LENS IMPLANT;  Surgeon: Jalene Mullet, MD;  Location: [place];  Service: Ophthalmology;  Laterality: Right;  ? COLONOSCOPY  11/26/2015  ? Dr Lyda Jester - follow-up in 5 years  ? EMBOLECTOMY Bilateral 06/27/2017  ? Procedure: BILATERAL FEMORAL POPLITEAL EMBOLECTOMY;  Surgeon: Serafina Mitchell, MD;  Location[facility];  Service: Vascular;  Laterality: Bilateral;  ? FASCIOTOMY Left 06/27/2017  ? Procedure: FULL COMPARTMENT LEFT LOWER LEG FASCIOTOMY;  Surgeon: Serafina Mitchell, MD;  Location: [place];  Service: Vascular;  Laterality: Left;  ? PARTIAL HYSTERECTOMY    ? PATCH ANGIOPLASTY Left 06/27/2017  ? Procedure: PATCH ANGIOPLASTY Left Posterior Tibial Artery;  Surgeon: Serafina Mitchell, MD;  Location: [facility];  Service: Vascular;  Laterality: Left;  ? 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 07/11/2017  ? Procedure: REPAIR OF COMPLEX TRACTION RETINAL DETACHMENT WITH ENDO LASER AND ANTIFUNGAL INJECTION;  Surgeon: Jalene Mullet, MD;  Location: [place];  Service: Ophthalmology;  Laterality: Right;  ? TEE WITHOUT CARDIOVERSION N/A 07/11/2017  ? Procedure: TRANSESOPHAGEAL ECHOCARDIOGRAM (TEE);  Surgeon: Acie Fredrickson Wonda Cheng, MD;  Location: [place];  Service: Cardiovascular;  Laterality: N/A;  ?  ?Family History  ?Problem Relation Age of Onset  ? Breast cancer Mother   ? Prostate cancer Father   ? Prostate cancer Brother   ? ?Social History  ? ?Socioeconomic History  ? Marital status: Widowed  ?  Spouse name: Not on file  ? Number of children: Not on file  ? Years of education: Not on file  ? Highest education level: Not on file  ?Occupational History  ? Not on file  ?Tobacco Use  ? Smoking status: Some Days  ?  Packs/day: 0.25  ?  Years: 53.00  ?  Pack years: 13.25  ?  Types: Cigarettes  ? Smokeless tobacco: Never  ?Vaping Use  ? Vaping Use: Never used  ?Substance and Sexual Activity  ? Alcohol use: No  ? Drug use: No  ? Sexual activity: Not Currently  ?Other Topics Concern  ? Not on file   ?Social History Narrative  ? Lives with her daughter  ? ?Social Determinants of Health  ? ?Financial Resource Strain: Low Risk   ? Difficulty of Paying Living Expenses: Not hard at all  ?Food Insecurity: No Food Insecurity  ? Worried About Charity fundraiser in the Last Year: Never true  ? Ran Out of Food in the Last Year: Never true  ?Transportation Needs: No Transportation Needs  ? Lack of Transportation (Medical): No  ? Lack of Transportation (Non-Medical): No  ?Physical Activity: Not on file  ?Stress: Not on file  ?Social Connections: Not on file  ? ? ?Review of Systems  ?Constitutional:  Negative for chills, fatigue and fever.  ?HENT:  Positive for postnasal drip, rhinorrhea and sneezing. Negative for congestion, ear pain and sore throat.   ?Eyes:  Negative.   ?Respiratory:  Negative for cough and shortness of breath.   ?Cardiovascular:  Negative for chest pain.  ?Gastrointestinal:  Negative for abdominal pain, constipation, diarrhea, nausea and vomiting.  ?Endocrine: Negative.   ?Genitourinary:  Negative for dysuria and urgency.  ?Musculoskeletal:  Positive for gait problem (w/c bound). Negative for back pain and myalgias.  ?Skin: Negative.   ?Allergic/Immunologic: Positive for environmental allergies.  ?Neurological:  Positive for weakness, numbness (left hemiparesis) and headaches (chronic migraines). Negative for dizziness and light-headedness.  ?Hematological:  Bruises/bleeds easily.  ?Psychiatric/Behavioral:  Negative for dysphoric mood. The patient is not nervous/anxious.   ? ? ?Objective:  ? ?BP 134/70   Pulse 65   Temp (!) 97.3 ?F (36.3 ?C)   Ht 5' 2.5" (1.588 m)   Wt 192 lb (87.1 kg)   SpO2 100%   BMI 34.56 kg/m?   ?BP/Weight 09/08/2021 06/02/2021 03/01/2021  ?Systolic BP 161 096 045  ?Diastolic BP 88 78 72  ?Wt. (Lbs) - 192 192  ?BMI 34.56 35.12 35.12  ? ? ?Physical Exam ?Vitals reviewed.  ?Constitutional:   ?   Appearance: Normal appearance.  ?HENT:  ?   Head: Normocephalic.  ?   Right Ear:  Tympanic membrane normal.  ?   Left Ear: Tympanic membrane normal.  ?   Nose: Congestion and rhinorrhea present.  ?   Mouth/Throat:  ?   Mouth: Mucous membranes are moist.  ?   Pharynx: Posterior oropharyngeal erythema pre

## 2021-12-14 NOTE — Patient Instructions (Addendum)
We will call you with lab results, mammogram and bone mineral density scan ?Continue medications ?Follow-up in 73-month ? ?Bone Health Information for Aging Adults ?Your bones do more than support your body. They also store calcium. The inside of your bones (marrow) makes blood cells. Maintaining bone health becomes more important as you age because your bones replace all their cells about every 10 years. Around age 73 it gets harder to replace those cells, and bones can become weak. Weak bones can lead to osteoporosis and breaks (fractures). Falls also become more likely, which can cause fractures. The good news is that, with diet and exercise, you can improve and maintain your bone health at any age. ?How to eat for bone health ?A balanced diet can supply many of the vitamins, minerals, and proteins you need for bone health. Older adults need to make sure they get enough calcium, vitamin D, and magnesium. You may need more of these as you age. ?Calcium ?Calcium is the most important (essential) mineral for bone health. The daily requirement for calcium for adult men aged 73-70 years is 1,000 mg. For adult women aged 731-70years, it is 1,200 mg. At age 7314or older, it is 1,200 mg for both men and women. ?Sources of calcium in your diet include: ?Dairy foods like milk, yogurt, and cheese. Dairy foods are the best sources. If you cannot eat dairy, you may need a calcium supplement. ?Leafy, dark green vegetables. These include collard greens, kale, broccoli, bok choy, and okra. ?Fatty fish, like sardines and canned salmon with bones. ?Almonds. ?Tofu. ? ?Vitamin D ?You need vitamin D for bone health because it helps your body absorb calcium from your diet. It is not found naturally in many foods. Many people can benefit from taking a supplement. The daily requirement for vitamin D at age 7355or older is 600-1,000 international units (IU). ?Dietary sources for vitamin D include: ?Fatty fish, such as swordfish, salmon,  sardine, and mackerel. ?Foods that have vitamin D added to them (are fortified), like cereal and dairy products. ?Egg yolks. ?Magnesium ?Magnesium helps your body use both calcium and vitamin D. The recommended daily intake for adult men is 400-420 mg. For adult women, it is 310-320 mg. Dietary sources of magnesium include: ?Green vegetables, such as collard greens, kale, bok choy, and okra. ?Poppy, sesame, and chia seeds. ?Legumes, including peas and beans. ?Whole grains. ?Avocados. ?Nuts. ?If you drink alcohol regularly or take a type of antacid called a proton pump inhibitor, you might benefit from a magnesium supplement. ?How to exercise for bone health ?Exercise is important for bone health because it strengthens bones and muscles. Bones are living organs that get stronger when you exercise them, just like your heart and other muscles. Muscles support your bones and protect your joints. Strong muscles also help prevent bone loss and falls. Weight-bearing exercises and resistance exercises are the two types of exercise that are most important for bone health. ?Weight-bearing exercises include running or walking, climbing stairs, and playing sports like tennis. ?Resistance exercises include those done using free weights, weight machines, or resistance bands. ?Try to get at least 30 minutes of exercise every day. You can also include stretching, balance, and flexibility exercises, like yoga or tai chi. These lower your risk of falls. ?Follow these instructions at home ?Ask your health care provider if: ?You need a bone density test. This is especially important for women older than 730and men older than 70. ?You have been losing height.  Loss of height may be a sign of weakening bones in your spine. ?Any of your medicines or medical conditions could affect your bone health. ?He or she could recommend an exercise program that is safe for you. Be sure it includes both weight-bearing and resistance exercises. ?Do not  use any products that contain nicotine or tobacco. These products include cigarettes, chewing tobacco, and vaping devices, such as e-cigarettes. These can reduce bone density. If you need help quitting, ask your health care provider. ?Drink alcohol in moderation. Alcohol reduces bone density and increases your risk for falls. If you drink alcohol: ?Limit how much you have to: ?0-1 drink a day for women who are not pregnant. ?0-2 drinks a day for men. ?Know how much alcohol is in a drink. In the [place] one drink equals one 12 oz bottle of beer (355 mL), one 5 oz glass of wine (148 mL), or one 1? oz glass of hard liquor (44 mL). ?Take over-the-counter and prescription medicines only as told by your health care provider. Ask your health care provider if you could benefit from taking calcium, vitamin D, or magnesium supplements. ?Keep all follow-up visits. This is important. ?Where to find more information ?American Bone Health: CardKnowledge.fi ?American Academy of Orthopaedic Surgeons: orthoinfo.org ?[employer] of Health: bones.SouthExposed.es ?Summary ?Maintaining your bone health becomes more important as you age. ?You can improve and maintain your bone health with diet and exercise at any age. ?A balanced diet can supply many of the vitamins, minerals, and proteins you need for bone health. Older adults need to make sure they get enough calcium, vitamin D, and magnesium. ?Ask your health care provider if you could benefit from taking calcium, vitamin D, or magnesium supplements. ?Exercise is important for bone health because it strengthens bones and muscles. Do both weight-bearing and resistance exercises. ?This information is not intended to replace advice given to you by your health care provider. Make sure you discuss any questions you have with your health care provider. ?Document Revised: 02/24/2021 Document Reviewed: 02/24/2021 ?Elsevier Patient Education ? [place]. ? ?Preventive Care  73 Years and Older, Female ?Preventive care refers to lifestyle choices and visits with your health care provider that can promote health and wellness. Preventive care visits are also called wellness exams. ?What can I expect for my preventive care visit? ?Counseling ?Your health care provider may ask you questions about your: ?Medical history, including: ?Past medical problems. ?Family medical history. ?Pregnancy and menstrual history. ?History of falls. ?Current health, including: ?Memory and ability to understand (cognition). ?Emotional well-being. ?Home life and relationship well-being. ?Sexual activity and sexual health. ?Lifestyle, including: ?Alcohol, nicotine or tobacco, and drug use. ?Access to firearms. ?Diet, exercise, and sleep habits. ?Work and work Statistician. ?Sunscreen use. ?Safety issues such as seatbelt and bike helmet use. ?Physical exam ?Your health care provider will check your: ?Height and weight. These may be used to calculate your BMI (body mass index). BMI is a measurement that tells if you are at a healthy weight. ?Waist circumference. This measures the distance around your waistline. This measurement also tells if you are at a healthy weight and may help predict your risk of certain diseases, such as type 2 diabetes and high blood pressure. ?Heart rate and blood pressure. ?Body temperature. ?Skin for abnormal spots. ?What immunizations do I need? ?Vaccines are usually given at various ages, according to a schedule. Your health care provider will recommend vaccines for you based on your age, medical history, and lifestyle or  other factors, such as travel or where you work. ?What tests do I need? ?Screening ?Your health care provider may recommend screening tests for certain conditions. This may include: ?Lipid and cholesterol levels. ?Hepatitis C test. ?Hepatitis B test. ?HIV (human immunodeficiency virus) test. ?STI (sexually transmitted infection) testing, if you are at risk. ?Lung  cancer screening. ?Colorectal cancer screening. ?Diabetes screening. This is done by checking your blood sugar (glucose) after you have not eaten for a while (fasting). ?Mammogram. Talk with your health car

## 2021-12-15 LAB — CBC WITH DIFFERENTIAL/PLATELET
Basophils Absolute: 0 10*3/uL (ref 0.0–0.2)
Basos: 0 %
EOS (ABSOLUTE): 0.3 10*3/uL (ref 0.0–0.4)
Eos: 4 %
Hematocrit: 37.3 % (ref 34.0–46.6)
Hemoglobin: 11.9 g/dL (ref 11.1–15.9)
Immature Grans (Abs): 0 10*3/uL (ref 0.0–0.1)
Immature Granulocytes: 0 %
Lymphocytes Absolute: 2.6 10*3/uL (ref 0.7–3.1)
Lymphs: 34 %
MCH: 25.3 pg — ABNORMAL LOW (ref 26.6–33.0)
MCHC: 31.9 g/dL (ref 31.5–35.7)
MCV: 79 fL (ref 79–97)
Monocytes Absolute: 0.9 10*3/uL (ref 0.1–0.9)
Monocytes: 11 %
Neutrophils Absolute: 3.9 10*3/uL (ref 1.4–7.0)
Neutrophils: 51 %
Platelets: 495 10*3/uL — ABNORMAL HIGH (ref 150–450)
RBC: 4.71 x10E6/uL (ref 3.77–5.28)
RDW: 16.1 % — ABNORMAL HIGH (ref 11.7–15.4)
WBC: 7.7 10*3/uL (ref 3.4–10.8)

## 2021-12-15 LAB — COMPREHENSIVE METABOLIC PANEL
ALT: 21 IU/L (ref 0–32)
AST: 23 IU/L (ref 0–40)
Albumin/Globulin Ratio: 1.5 (ref 1.2–2.2)
Albumin: 4.5 g/dL (ref 3.7–4.7)
Alkaline Phosphatase: 91 IU/L (ref 44–121)
BUN/Creatinine Ratio: 14 (ref 12–28)
BUN: 15 mg/dL (ref 8–27)
Bilirubin Total: <0.2 mg/dL (ref 0.0–1.2)
CO2: 20 mmol/L (ref 20–29)
Calcium: 9.9 mg/dL (ref 8.7–10.3)
Chloride: 101 mmol/L (ref 96–106)
Creatinine, Ser: 1.04 mg/dL — ABNORMAL HIGH (ref 0.57–1.00)
Globulin, Total: 3.1 g/dL (ref 1.5–4.5)
Glucose: 100 mg/dL — ABNORMAL HIGH (ref 70–99)
Potassium: 5.7 mmol/L — ABNORMAL HIGH (ref 3.5–5.2)
Sodium: 138 mmol/L (ref 134–144)
Total Protein: 7.6 g/dL (ref 6.0–8.5)
eGFR: 57 mL/min/{1.73_m2} — ABNORMAL LOW (ref >59)

## 2021-12-15 LAB — LIPID PANEL
Chol/HDL Ratio: 3.6 ratio (ref 0.0–4.4)
Cholesterol, Total: 173 mg/dL (ref 100–199)
HDL: 48 mg/dL (ref >39)
LDL Chol Calc (NIH): 93 mg/dL (ref 0–99)
Triglycerides: 189 mg/dL — ABNORMAL HIGH (ref 0–149)
VLDL Cholesterol Cal: 32 mg/dL (ref 5–40)

## 2021-12-15 LAB — CARDIOVASCULAR RISK ASSESSMENT

## 2021-12-15 LAB — TSH: TSH: 3.49 u[IU]/mL (ref 0.450–4.500)

## 2021-12-16 ENCOUNTER — Encounter: Payer: Self-pay | Admitting: Nurse Practitioner

## 2021-12-22 ENCOUNTER — Encounter: Payer: Self-pay | Admitting: Nurse Practitioner

## 2022-01-20 ENCOUNTER — Other Ambulatory Visit: Payer: Self-pay | Admitting: Nurse Practitioner

## 2022-01-20 DIAGNOSIS — I10 Essential (primary) hypertension: Secondary | ICD-10-CM

## 2022-01-25 ENCOUNTER — Other Ambulatory Visit: Payer: Self-pay | Admitting: Nurse Practitioner

## 2022-01-25 ENCOUNTER — Other Ambulatory Visit: Payer: Self-pay

## 2022-01-25 DIAGNOSIS — R110 Nausea: Secondary | ICD-10-CM

## 2022-01-25 DIAGNOSIS — E782 Mixed hyperlipidemia: Secondary | ICD-10-CM

## 2022-01-26 MED ORDER — RIVAROXABAN 20 MG PO TABS: 20.0000 mg | ORAL_TABLET | Freq: Every day | ORAL | 1 refills | Status: DC

## 2022-01-26 MED ORDER — ONDANSETRON HCL 8 MG PO TABS: 8.0000 mg | ORAL_TABLET | Freq: Three times a day (TID) | ORAL | 3 refills | Status: DC | PRN

## 2022-01-26 MED ORDER — BUTALBITAL-APAP-CAFFEINE 50-325-40 MG PO TABS: 1.0000 | ORAL_TABLET | Freq: Four times a day (QID) | ORAL | 2 refills | Status: DC | PRN

## 2022-01-28 ENCOUNTER — Other Ambulatory Visit: Payer: Self-pay

## 2022-01-28 MED ORDER — BUTALBITAL-APAP-CAFFEINE 50-325-40 MG PO TABS
1.0000 | ORAL_TABLET | Freq: Four times a day (QID) | ORAL | 2 refills | Status: DC | PRN
Start: 2022-01-28 — End: 2022-06-29

## 2022-02-01 DIAGNOSIS — Z1231 Encounter for screening mammogram for malignant neoplasm of breast: Secondary | ICD-10-CM | POA: Diagnosis not present

## 2022-02-01 DIAGNOSIS — M85831 Other specified disorders of bone density and structure, right forearm: Secondary | ICD-10-CM | POA: Diagnosis not present

## 2022-02-01 DIAGNOSIS — N959 Unspecified menopausal and perimenopausal disorder: Secondary | ICD-10-CM | POA: Diagnosis not present

## 2022-02-01 DIAGNOSIS — M8580 Other specified disorders of bone density and structure, unspecified site: Secondary | ICD-10-CM | POA: Diagnosis not present

## 2022-02-02 ENCOUNTER — Telehealth: Payer: Self-pay

## 2022-02-02 NOTE — Telephone Encounter (Signed)
Attempted to contact patient to inform her of her normal mammogram results and the need to repeat in 1 yr as per Shannon's recommendation. ?

## 2022-02-03 ENCOUNTER — Telehealth: Payer: Self-pay

## 2022-02-03 ENCOUNTER — Other Ambulatory Visit: Payer: Self-pay

## 2022-02-03 DIAGNOSIS — Z1231 Encounter for screening mammogram for malignant neoplasm of breast: Secondary | ICD-10-CM

## 2022-02-03 NOTE — Chronic Care Management (AMB) (Signed)
Chronic Care Management Pharmacy Assistant   Name: Anna Hayes  MRN: [id number] DOB: [birth year]  Reason for Encounter: Disease State/ Hypertension  Recent office visits:  12-14-2021 Rip Harbour, NP. MCH= 25.3, RDW= 16.1, Platelets= 495. Glucose= 100, Creatinine= 1.04, eGFR= 57, Potassium= 5.7. Trig= 189. Bone density and mammogram ordered. START flonase 2 sprays in each nostril daily. START zofran 8 mg every 8 hours as needed.   Recent consult visits:  None  Hospital visits:  None in previous 6 months  Medications: Outpatient Encounter Medications as of 02/03/2022  Medication Sig   atenolol (TENORMIN) 50 MG tablet TAKE 1 TABLET BY MOUTH EVERY DAY   butalbital-acetaminophen-caffeine (FIORICET) 50-325-40 MG tablet Take 1 tablet by mouth every 6 (six) hours as needed for migraine.   cyanocobalamin (,VITAMIN B-12,) 1000 MCG/ML injection Inject into the muscle.   fluticasone (FLONASE) 50 MCG/ACT nasal spray Place 2 sprays into both nostrils daily.   furosemide (LASIX) 20 MG tablet Take 20 mg by mouth daily as needed for fluid.    levothyroxine (SYNTHROID) 25 MCG tablet Take 1 tablet (25 mcg total) by mouth daily.   LINZESS 145 MCG CAPS capsule TAKE 1 CAPSULE BY MOUTH EVERY DAY BEFORE BREAKFAST   lisinopril (ZESTRIL) 20 MG tablet Take 1 tablet (20 mg total) by mouth daily.   omega-3 acid ethyl esters (LOVAZA) 1 g capsule TAKE 2 CAPSULES BY MOUTH 2 TIMES DAILY.   omeprazole (PRILOSEC) 40 MG capsule TAKE 1 CAPSULE BY MOUTH EVERY DAY   ondansetron (ZOFRAN) 8 MG tablet Take 1 tablet (8 mg total) by mouth every 8 (eight) hours as needed for nausea or vomiting.   promethazine (PHENERGAN) 25 MG tablet Take 1 tablet (25 mg total) by mouth every 8 (eight) hours as needed.   rivaroxaban (XARELTO) 20 MG TABS tablet Take 1 tablet (20 mg total) by mouth daily with supper.   simvastatin (ZOCOR) 40 MG tablet TAKE 1 TABLET BY MOUTH EVERY DAY AT NIGHT   No facility-administered encounter  medications on file as of 02/03/2022.   Recent Office Vitals: BP Readings from Last 3 Encounters:  12/14/21 134/70  09/08/21 (!) 152/88  06/02/21 (!) 146/78   Pulse Readings from Last 3 Encounters:  12/14/21 65  09/08/21 70  06/02/21 70    Wt Readings from Last 3 Encounters:  12/14/21 192 lb (87.1 kg)  06/02/21 192 lb (87.1 kg)  03/01/21 192 lb (87.1 kg)     Kidney Function Lab Results  Component Value Date/Time   CREATININE 1.04 (H) 12/14/2021 03:36 PM   CREATININE 1.03 (H) 09/08/2021 03:18 PM   CREATININE 0.47 (L) 08/31/2017 05:20 PM   GFRNONAA 60 08/19/2020 01:41 PM   GFRNONAA 101 08/31/2017 05:20 PM   GFRAA 70 08/19/2020 01:41 PM   GFRAA 118 08/31/2017 05:20 PM       Latest Ref Rng & Units 12/14/2021    3:36 PM 09/08/2021    3:18 PM 06/09/2021    2:30 PM  BMP  Glucose 70 - 99 mg/dL 100   90   95    BUN 8 - 27 mg/dL '15   14   14    ' Creatinine 0.57 - 1.00 mg/dL 1.04   1.03   0.92    BUN/Creat Ratio 12 - '28 14   14   15    ' Sodium 134 - 144 mmol/L 138   136   142    Potassium 3.5 - 5.2 mmol/L 5.7   5.5  5.1    Chloride 96 - 106 mmol/L 101   100   106    CO2 20 - 29 mmol/L '20   23   20    ' Calcium 8.7 - 10.3 mg/dL 9.9   9.7   9.4       02-03-2022: 1st attempt. No VM 02-09-2022: 2nd attempt No VM 02-11-2022: 3rd attempt No VM  Care Gaps: Last annual wellness visit? 07-23-2021 Tdap overdue Shingrix overdue  Star Rating Drugs: Simvastatin 40 mg- Last filled 12-20-2021 90 DS Lisinopril 20 mg- Last filled 12-08-2021 90 DS  [place] Clinical Pharmacist Assistant [phone]

## 2022-02-14 ENCOUNTER — Other Ambulatory Visit: Payer: Self-pay

## 2022-02-14 DIAGNOSIS — Z13820 Encounter for screening for osteoporosis: Secondary | ICD-10-CM

## 2022-03-16 ENCOUNTER — Telehealth: Payer: Self-pay

## 2022-03-16 NOTE — Chronic Care Management (AMB) (Unsigned)
Chronic Care Management Pharmacy Assistant   Name: Anna Hayes  MRN: [id number] DOB: 1950-11-10  Reason for Encounter: Disease State/ Hypertension  Recent office visits:  12-14-2021 Rip Harbour, NP. MCH= 25.3, RDW= 16.1, Platelets= 495. Glucose= 100, Creatinine= 1.04, eGFR= 57, Potassium= 5.7. Trig= 189. MM Digital Screening ordered. START flonase 2 sprays into both nostrils daily and zofran 8 mg every 8 hours as needed  Recent consult visits:  None  Hospital visits:  None in previous 6 months  Medications: Outpatient Encounter Medications as of 03/16/2022  Medication Sig   atenolol (TENORMIN) 50 MG tablet TAKE 1 TABLET BY MOUTH EVERY DAY   butalbital-acetaminophen-caffeine (FIORICET) 50-325-40 MG tablet Take 1 tablet by mouth every 6 (six) hours as needed for migraine.   cyanocobalamin (,VITAMIN B-12,) 1000 MCG/ML injection Inject into the muscle.   fluticasone (FLONASE) 50 MCG/ACT nasal spray Place 2 sprays into both nostrils daily.   furosemide (LASIX) 20 MG tablet Take 20 mg by mouth daily as needed for fluid.    levothyroxine (SYNTHROID) 25 MCG tablet Take 1 tablet (25 mcg total) by mouth daily.   LINZESS 145 MCG CAPS capsule TAKE 1 CAPSULE BY MOUTH EVERY DAY BEFORE BREAKFAST   lisinopril (ZESTRIL) 20 MG tablet Take 1 tablet (20 mg total) by mouth daily.   omega-3 acid ethyl esters (LOVAZA) 1 g capsule TAKE 2 CAPSULES BY MOUTH 2 TIMES DAILY.   omeprazole (PRILOSEC) 40 MG capsule TAKE 1 CAPSULE BY MOUTH EVERY DAY   ondansetron (ZOFRAN) 8 MG tablet Take 1 tablet (8 mg total) by mouth every 8 (eight) hours as needed for nausea or vomiting.   promethazine (PHENERGAN) 25 MG tablet Take 1 tablet (25 mg total) by mouth every 8 (eight) hours as needed.   rivaroxaban (XARELTO) 20 MG TABS tablet Take 1 tablet (20 mg total) by mouth daily with supper.   simvastatin (ZOCOR) 40 MG tablet TAKE 1 TABLET BY MOUTH EVERY DAY AT NIGHT   No facility-administered encounter  medications on file as of 03/16/2022.    Recent Office Vitals: BP Readings from Last 3 Encounters:  12/14/21 134/70  09/08/21 (!) 152/88  06/02/21 (!) 146/78   Pulse Readings from Last 3 Encounters:  12/14/21 65  09/08/21 70  06/02/21 70    Wt Readings from Last 3 Encounters:  12/14/21 192 lb (87.1 kg)  06/02/21 192 lb (87.1 kg)  03/01/21 192 lb (87.1 kg)     Kidney Function Lab Results  Component Value Date/Time   CREATININE 1.04 (H) 12/14/2021 03:36 PM   CREATININE 1.03 (H) 09/08/2021 03:18 PM   CREATININE 0.47 (L) 08/31/2017 05:20 PM   GFRNONAA 60 08/19/2020 01:41 PM   GFRNONAA 101 08/31/2017 05:20 PM   GFRAA 70 08/19/2020 01:41 PM   GFRAA 118 08/31/2017 05:20 PM       Latest Ref Rng & Units 12/14/2021    3:36 PM 09/08/2021    3:18 PM 06/09/2021    2:30 PM  BMP  Glucose 70 - 99 mg/dL 100  90  95   BUN 8 - 27 mg/dL _0 Creatinine 0.57 - 1.00 mg/dL 1.04  1.03  0.92   BUN/Creat Ratio 12 - _1 Sodium 134 - 144 mmol/L 138  136  142   Potassium 3.5 - 5.2 mmol/L 5.7  5.5  5.1   Chloride 96 - 106 mmol/L 101  100  106   CO2 20 -  29 mmol/L 20  23  20   Calcium 8.7 - 10.3 mg/dL 9.9  9.7  9.4      Current antihypertensive regimen:  Lisinopril 40 mg daily Atenolol 50 mg daily  Patient verbally confirms she is taking the above medications as directed. {yes/no:20286}  How often are you checking your Blood Pressure? {CHL HP BP Monitoring Frequency:2109141004}  she checks her blood pressure {timing:25218} {before/after:25217} taking her medication.  Current home BP readings:  Wrist or arm cuff: Caffeine intake: Salt intake: OTC medications including pseudoephedrine or NSAIDs?  Any readings above 180/120? {yes/no:20286} If yes any symptoms of hypertensive emergency? {hypertensive emergency symptoms:25354}   What recent interventions/DTPs have been made by any provider to improve Blood Pressure control since last CPP Visit:  Educated on BP goals  and benefits of medications for prevention of heart attack, stroke and kidney damage; Extensive time spent on counseling patient on blood pressure goal and impact of each antihypertensive medication on their blood pressure and risk reduction for CV disease.  Used analogies to explain the need for multiple antihypertensive medications to achieve BP goals and that it is often a silent disease with no symptoms.  -Counseled to monitor BP at home Weekly, document, and provide log at future appointments   Any recent hospitalizations or ED visits since last visit with CPP? No  What diet changes have been made to improve Blood Pressure Control?  ***  What exercise is being done to improve your Blood Pressure Control?  ***  Adherence Review: Is the patient currently on ACE/ARB medication? Yes Does the patient have >5 day gap between last estimated fill dates? CPP to review  03-16-2022: 1st attempt no VM 03-17-2022: 2nd attempt no VM  Care Gaps: Last annual wellness visit? 07-23-2021 Tdap overdue Shingrix overdue  Star Rating Drugs: Lisinopril 20 mg- Last filled 02-27-2022 90 DS Simvastatin 40 mg- Last filled 03-11-2022 90 DS  Malecca Hicks CMA Clinical Pharmacist Assistant [phone]  

## 2022-03-23 ENCOUNTER — Ambulatory Visit (INDEPENDENT_AMBULATORY_CARE_PROVIDER_SITE_OTHER): Payer: Medicare HMO | Admitting: Nurse Practitioner

## 2022-03-23 VITALS — BP 132/66 | HR 66 | Resp 18

## 2022-03-23 DIAGNOSIS — E038 Other specified hypothyroidism: Secondary | ICD-10-CM

## 2022-03-23 DIAGNOSIS — Z7409 Other reduced mobility: Secondary | ICD-10-CM

## 2022-03-23 DIAGNOSIS — Z89612 Acquired absence of left leg above knee: Secondary | ICD-10-CM

## 2022-03-23 DIAGNOSIS — G8194 Hemiplegia, unspecified affecting left nondominant side: Secondary | ICD-10-CM | POA: Diagnosis not present

## 2022-03-23 DIAGNOSIS — M24542 Contracture, left hand: Secondary | ICD-10-CM | POA: Diagnosis not present

## 2022-03-23 DIAGNOSIS — I739 Peripheral vascular disease, unspecified: Secondary | ICD-10-CM | POA: Diagnosis not present

## 2022-03-23 DIAGNOSIS — Z7901 Long term (current) use of anticoagulants: Secondary | ICD-10-CM

## 2022-03-23 DIAGNOSIS — E782 Mixed hyperlipidemia: Secondary | ICD-10-CM

## 2022-03-23 DIAGNOSIS — D6869 Other thrombophilia: Secondary | ICD-10-CM

## 2022-03-23 DIAGNOSIS — I6930 Unspecified sequelae of cerebral infarction: Secondary | ICD-10-CM | POA: Diagnosis not present

## 2022-03-23 DIAGNOSIS — R110 Nausea: Secondary | ICD-10-CM

## 2022-03-23 DIAGNOSIS — E538 Deficiency of other specified B group vitamins: Secondary | ICD-10-CM

## 2022-03-23 DIAGNOSIS — F17218 Nicotine dependence, cigarettes, with other nicotine-induced disorders: Secondary | ICD-10-CM

## 2022-03-23 MED ORDER — ONDANSETRON HCL 8 MG PO TABS: 8.0000 mg | ORAL_TABLET | Freq: Three times a day (TID) | ORAL | 3 refills | Status: DC | PRN

## 2022-03-23 MED ORDER — SIMVASTATIN 40 MG PO TABS: ORAL_TABLET | ORAL | 0 refills | Status: DC

## 2022-03-23 MED ORDER — ONDANSETRON HCL 4 MG PO TABS: 4.0000 mg | ORAL_TABLET | Freq: Three times a day (TID) | ORAL | 1 refills | Status: DC | PRN

## 2022-03-23 NOTE — Patient Instructions (Signed)
Recommend routine eye exam We will call you with lab results Continue medications8 Follow-up in 47-month, fasting  Managing the Challenge of Quitting Smoking Quitting smoking is a physical and mental challenge. You may have cravings, withdrawal symptoms, and temptation to smoke. Before quitting, work with your health care provider to make a plan that can help you manage quitting. Making a plan before you quit may keep you from smoking when you have the urge to smoke while trying to quit. How to manage lifestyle changes Managing stress Stress can make you want to smoke, and wanting to smoke may cause stress. It is important to find ways to manage your stress. You could try some of the following: Practice relaxation techniques. Breathe slowly and deeply, in through your nose and out through your mouth. Listen to music. Soak in a bath or take a shower. Imagine a peaceful place or vacation. Get some support. Talk with family or friends about your stress. Join a support group. Talk with a counselor or therapist. Get some physical activity. Go for a walk, run, or bike ride. Play a favorite sport. Practice yoga.  Medicines Talk with your health care provider about medicines that might help you deal with cravings and make quitting easier for you. Relationships Social situations can be difficult when you are quitting smoking. To manage this, you can: Avoid parties and other social situations where people might be smoking. Avoid alcohol. Leave right away if you have the urge to smoke. Explain to your family and friends that you are quitting smoking. Ask for support and let them know you might be a bit grumpy. Plan activities where smoking is not an option. General instructions Be aware that many people gain weight after they quit smoking. However, not everyone does. To keep from gaining weight, have a plan in place before you quit, and stick to the plan after you quit. Your plan should  include: Eating healthy snacks. When you have a craving, it may help to: Eat popcorn, or try carrots, celery, or other cut vegetables. Chew sugar-free gum. Changing how you eat. Eat small portion sizes at meals. Eat 4-6 small meals throughout the day instead of 1-2 large meals a day. Be mindful when you eat. You should avoid watching television or doing other things that might distract you as you eat. Exercising regularly. Make time to exercise each day. If you do not have time for a long workout, do short bouts of exercise for 5-10 minutes several times a day. Do some form of strengthening exercise, such as weight lifting. Do some exercise that gets your heart beating and causes you to breathe deeply, such as walking fast, running, swimming, or biking. This is very important. Drinking plenty of water or other low-calorie or no-calorie drinks. Drink enough fluid to keep your urine pale yellow.  How to recognize withdrawal symptoms Your body and mind may experience discomfort as you try to get used to not having nicotine in your system. These effects are called withdrawal symptoms. They may include: Feeling hungrier than normal. Having trouble concentrating. Feeling irritable or restless. Having trouble sleeping. Feeling depressed. Craving a cigarette. These symptoms may surprise you, but they are normal to have when quitting smoking. To manage withdrawal symptoms: Avoid places, people, and activities that trigger your cravings. Remember why you want to quit. Get plenty of sleep. Avoid coffee and other drinks that contain caffeine. These may worsen some of your symptoms. How to manage cravings Come up with a plan for  how to deal with your cravings. The plan should include the following: A definition of the specific situation you want to deal with. An activity or action you will take to replace smoking. A clear idea for how this action will help. The name of someone who could help you  with this. Cravings usually last for 5-10 minutes. Consider taking the following actions to help you with your plan to deal with cravings: Keep your mouth busy. Chew sugar-free gum. Suck on hard candies or a straw. Brush your teeth. Keep your hands and body busy. Change to a different activity right away. Squeeze or play with a ball. Do an activity or a hobby, such as making bead jewelry, practicing needlepoint, or working with wood. Mix up your normal routine. Take a short exercise break. Go for a quick walk, or run up and down stairs. Focus on doing something kind or helpful for someone else. Call a friend or family member to talk during a craving. Join a support group. Contact a quitline. Where to find support To get help or find a support group: Call the [facility]'s Smoking Quitline: 1-800-QUIT-NOW 570-429-0853) Text QUIT to SmokefreeTXT: 102585 Where to find more information Visit these websites to find more information on quitting smoking: U.S. Department of Health and Human Services: www.smokefree.gov American Lung Association: www.freedomfromsmoking.org Centers for Disease Control and Prevention (CDC): http://www.wolf.info/ American Heart Association: www.heart.org Contact a health care provider if: You want to change your plan for quitting. The medicines you are taking are not helping. Your eating feels out of control or you cannot sleep. You feel depressed or become very anxious. Summary Quitting smoking is a physical and mental challenge. You will face cravings, withdrawal symptoms, and temptation to smoke again. Preparation can help you as you go through these challenges. Try different techniques to manage stress, handle social situations, and prevent weight gain. You can deal with cravings by keeping your mouth busy (such as by chewing gum), keeping your hands and body busy, calling family or friends, or contacting a quitline for people who want to quit smoking. You  can deal with withdrawal symptoms by avoiding places where people smoke, getting plenty of rest, and avoiding drinks that contain caffeine. This information is not intended to replace advice given to you by your health care provider. Make sure you discuss any questions you have with your health care provider. Document Revised: 09/03/2021 Document Reviewed: 09/03/2021 Elsevier Patient Education  [place].

## 2022-03-23 NOTE — Progress Notes (Unsigned)
Subjective:  Patient ID: Anna Hayes, female    DOB: [birth year]  Age: 71 y.o. MRN: [id number]  No chief complaint on file.   HPI She was last seen for hypertension {NUMBERS 1-12:18279} {days/wks/mos/yrs:310907} ago.  BP at that visit was ***. Management includes ***.  She reports {excellent/good/fair/poor:19665} compliance with treatment. She {is/is not:9024} having side effects. {document side effects if present:1} She is following a {diet:21022986} diet. She {is/is not:9024} exercising. She {does/does not:200015} smoke.  Use of agents associated with hypertension: {bp agents assoc with hypertension:511::"none"}.   Outside blood pressures are {***enter patient reported home BP readings, or 'not being checked':1}.  Pertinent labs: Lab Results  Component Value Date   CHOL 173 12/14/2021   HDL 48 12/14/2021   LDLCALC 93 12/14/2021   TRIG 189 (H) 12/14/2021   CHOLHDL 3.6 12/14/2021   Lab Results  Component Value Date   NA 138 12/14/2021   K 5.7 (H) 12/14/2021   CREATININE 1.04 (H) 12/14/2021   EGFR 57 (L) 12/14/2021   GFRNONAA 60 08/19/2020   GLUCOSE 100 (H) 12/14/2021     The ASCVD Risk score (Arnett DK, et al., 2019) failed to calculate for the following reasons:   The patient has a prior MI or stroke diagnosis   Lipid/Cholesterol, Follow-up  Last lipid panel Other pertinent labs  Lab Results  Component Value Date   CHOL 173 12/14/2021   HDL 48 12/14/2021   LDLCALC 93 12/14/2021   TRIG 189 (H) 12/14/2021   CHOLHDL 3.6 12/14/2021   Lab Results  Component Value Date   ALT 21 12/14/2021   AST 23 12/14/2021   PLT 495 (H) 12/14/2021   TSH 3.490 12/14/2021     She was last seen for this {1-12:18279} {days/wks/mos/yrs:310907} ago.  Management includes ***.  She reports {excellent/good/fair/poor:19665} compliance with treatment. She {is/is not:9024} having side effects. {document side effects if present:1}  Current diet: {diet habits:16563} Current  exercise: {exercise types:16438}  The ASCVD Risk score (Arnett DK, et al., 2019) failed to calculate for the following reasons:   The patient has a prior MI or stroke diagnosis  Hypothyroidism - Medications: Synthroid *** - Current symptoms:  {$Symptoms; thyroid:541 610 1589} - Denies {$Symptoms; thyroid:541 610 1589} - Symptoms have {$Desc; symptom progression:769 797 0525}    Current Outpatient Medications on File Prior to Visit  Medication Sig Dispense Refill   atenolol (TENORMIN) 50 MG tablet TAKE 1 TABLET BY MOUTH EVERY DAY 90 tablet 1   butalbital-acetaminophen-caffeine (FIORICET) 50-325-40 MG tablet Take 1 tablet by mouth every 6 (six) hours as needed for migraine. 30 tablet 2   cyanocobalamin (,VITAMIN B-12,) 1000 MCG/ML injection Inject into the muscle.     fluticasone (FLONASE) 50 MCG/ACT nasal spray Place 2 sprays into both nostrils daily. 16 g 6   furosemide (LASIX) 20 MG tablet Take 20 mg by mouth daily as needed for fluid.      levothyroxine (SYNTHROID) 25 MCG tablet Take 1 tablet (25 mcg total) by mouth daily. 90 tablet 2   LINZESS 145 MCG CAPS capsule TAKE 1 CAPSULE BY MOUTH EVERY DAY BEFORE BREAKFAST 90 capsule 1   lisinopril (ZESTRIL) 20 MG tablet Take 1 tablet (20 mg total) by mouth daily. 90 tablet 3   omega-3 acid ethyl esters (LOVAZA) 1 g capsule TAKE 2 CAPSULES BY MOUTH 2 TIMES DAILY. 360 capsule 1   omeprazole (PRILOSEC) 40 MG capsule TAKE 1 CAPSULE BY MOUTH EVERY DAY 90 capsule 1   ondansetron (ZOFRAN) 8 MG tablet Take 1 tablet (8 mg  total) by mouth every 8 (eight) hours as needed for nausea or vomiting. 30 tablet 3   promethazine (PHENERGAN) 25 MG tablet Take 1 tablet (25 mg total) by mouth every 8 (eight) hours as needed. 60 tablet 0   rivaroxaban (XARELTO) 20 MG TABS tablet Take 1 tablet (20 mg total) by mouth daily with supper. 90 tablet 1   simvastatin (ZOCOR) 40 MG tablet TAKE 1 TABLET BY MOUTH EVERY DAY AT NIGHT 90 tablet 0   No current facility-administered  medications on file prior to visit.   Past Medical History:  Diagnosis Date   Anemia    Constipation    DVT of lower extremity (deep venous thrombosis) (HCC)    Frequent headaches    Heart murmur    found during pregnancy -  no problems   Hypertension    Muscle pain    Palpitations    Septic thrombophlebitis 08/31/2017   Sinus problem    Stroke [facility])    Past Surgical History:  Procedure Laterality Date   AMPUTATION Left 07/05/2017   Procedure: LEFT ABOVE KNEE AMPUTATION;  Surgeon: Conrad [place], MD;  Location: [place];  Service: Vascular;  Laterality: Left;   CATARACT EXTRACTION W/PHACO Right 06/12/2018   Procedure: CATARACT EXTRACTION WITH INTRAOCULAR LENS IMPLANT;  Surgeon: Jalene Mullet, MD;  Location: [place];  Service: Ophthalmology;  Laterality: Right;   COLONOSCOPY  11/26/2015   Dr Lyda Jester - follow-up in 5 years   EMBOLECTOMY Bilateral 06/27/2017   Procedure: BILATERAL FEMORAL POPLITEAL EMBOLECTOMY;  Surgeon: Serafina Mitchell, MD;  Location: [facility] OR;  Service: Vascular;  Laterality: Bilateral;   FASCIOTOMY Left 06/27/2017   Procedure: FULL COMPARTMENT LEFT LOWER LEG FASCIOTOMY;  Surgeon: Serafina Mitchell, MD;  Location: [place];  Service: Vascular;  Laterality: Left;   PARTIAL HYSTERECTOMY     PATCH ANGIOPLASTY Left 06/27/2017   Procedure: PATCH ANGIOPLASTY Left Posterior Tibial Artery;  Surgeon: Serafina Mitchell, MD;  Location: [place];  Service: Vascular;  Laterality: Left;   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 07/11/2017   Procedure: REPAIR OF COMPLEX TRACTION RETINAL DETACHMENT WITH ENDO LASER AND ANTIFUNGAL INJECTION;  Surgeon: Jalene Mullet, MD;  Location: [place];  Service: Ophthalmology;  Laterality: Right;   TEE WITHOUT CARDIOVERSION N/A 07/11/2017   Procedure: TRANSESOPHAGEAL ECHOCARDIOGRAM (TEE);  Surgeon: Acie Fredrickson Wonda Cheng, MD;  Location: [facility] ENDOSCOPY;  Service: Cardiovascular;  Laterality: N/A;    Family History  Problem Relation Age of Onset    Breast cancer Mother    Prostate cancer Father    Prostate cancer Brother    Social History   Socioeconomic History   Marital status: Widowed    Spouse name: Not on file   Number of children: Not on file   Years of education: Not on file   Highest education level: Not on file  Occupational History   Not on file  Tobacco Use   Smoking status: Some Days    Packs/day: 0.25    Years: 53.00    Total pack years: 13.25    Types: Cigarettes   Smokeless tobacco: Never  Vaping Use   Vaping Use: Never used  Substance and Sexual Activity   Alcohol use: No   Drug use: No   Sexual activity: Not Currently  Other Topics Concern   Not on file  Social History Narrative   Lives with her daughter   Social Determinants of Health   Financial Resource Strain: Low Risk  (07/02/2021)   Overall Financial  Resource Strain (CARDIA)    Difficulty of Paying Living Expenses: Not hard at all  Food Insecurity: No Food Insecurity (07/23/2021)   Hunger Vital Sign    Worried About Running Out of Food in the Last Year: Never true    Ran Out of Food in the Last Year: Never true  Transportation Needs: No Transportation Needs (07/02/2021)   PRAPARE - Hydrologist (Medical): No    Lack of Transportation (Non-Medical): No  Physical Activity: Not on file  Stress: Not on file  Social Connections: Not on file    Review of Systems   Objective:  There were no vitals taken for this visit.     12/14/2021    3:08 PM 09/08/2021    2:58 PM 06/02/2021    2:46 PM  BP/Weight  Systolic BP 161 096 045  Diastolic BP 70 88 78  Wt. (Lbs) 192  192  BMI 34.56 kg/m2  35.12 kg/m2    Physical Exam  Diabetic Foot Exam - Simple   No data filed      Lab Results  Component Value Date   WBC 7.7 12/14/2021   HGB 11.9 12/14/2021   HCT 37.3 12/14/2021   PLT 495 (H) 12/14/2021   GLUCOSE 100 (H) 12/14/2021   CHOL 173 12/14/2021   TRIG 189 (H) 12/14/2021   HDL 48 12/14/2021   LDLCALC  93 12/14/2021   ALT 21 12/14/2021   AST 23 12/14/2021   NA 138 12/14/2021   K 5.7 (H) 12/14/2021   CL 101 12/14/2021   CREATININE 1.04 (H) 12/14/2021   BUN 15 12/14/2021   CO2 20 12/14/2021   TSH 3.490 12/14/2021   INR 1.21 06/12/2018      Assessment & Plan:   Problem List Items Addressed This Visit   None .  No orders of the defined types were placed in this encounter.   No orders of the defined types were placed in this encounter.    Follow-up: No follow-ups on file.  An After Visit Summary was printed and given to the patient.  I,Dekota Shenk C Ayanna Gheen,acting as a Education administrator for [employer], NP.,have documented all relevant documentation on the behalf of Rip Harbour, NP,as directed by  Rip Harbour, NP while in the presence of Rip Harbour, NP.   Rip Harbour, NP [place] [phone]

## 2022-03-24 ENCOUNTER — Encounter: Payer: Self-pay | Admitting: Nurse Practitioner

## 2022-03-24 LAB — CBC WITH DIFFERENTIAL/PLATELET
Basophils Absolute: 0 10*3/uL (ref 0.0–0.2)
Basos: 1 %
EOS (ABSOLUTE): 0.2 10*3/uL (ref 0.0–0.4)
Eos: 3 %
Hematocrit: 35.5 % (ref 34.0–46.6)
Hemoglobin: 11.2 g/dL (ref 11.1–15.9)
Immature Grans (Abs): 0 10*3/uL (ref 0.0–0.1)
Immature Granulocytes: 1 %
Lymphocytes Absolute: 2.4 10*3/uL (ref 0.7–3.1)
Lymphs: 32 %
MCH: 25.7 pg — ABNORMAL LOW (ref 26.6–33.0)
MCHC: 31.5 g/dL (ref 31.5–35.7)
MCV: 82 fL (ref 79–97)
Monocytes Absolute: 0.9 10*3/uL (ref 0.1–0.9)
Monocytes: 12 %
Neutrophils Absolute: 3.8 10*3/uL (ref 1.4–7.0)
Neutrophils: 51 %
Platelets: 413 10*3/uL (ref 150–450)
RBC: 4.35 x10E6/uL (ref 3.77–5.28)
RDW: 15.9 % — ABNORMAL HIGH (ref 11.7–15.4)
WBC: 7.5 10*3/uL (ref 3.4–10.8)

## 2022-03-24 LAB — LIPID PANEL
Chol/HDL Ratio: 2.8 ratio (ref 0.0–4.4)
Cholesterol, Total: 131 mg/dL (ref 100–199)
HDL: 46 mg/dL (ref >39)
LDL Chol Calc (NIH): 57 mg/dL (ref 0–99)
Triglycerides: 168 mg/dL — ABNORMAL HIGH (ref 0–149)
VLDL Cholesterol Cal: 28 mg/dL (ref 5–40)

## 2022-03-24 LAB — COMPREHENSIVE METABOLIC PANEL
ALT: 17 IU/L (ref 0–32)
AST: 16 IU/L (ref 0–40)
Albumin/Globulin Ratio: 1.6 (ref 1.2–2.2)
Albumin: 4.4 g/dL (ref 3.7–4.7)
Alkaline Phosphatase: 74 IU/L (ref 44–121)
BUN/Creatinine Ratio: 19 (ref 12–28)
BUN: 16 mg/dL (ref 8–27)
Bilirubin Total: 0.2 mg/dL (ref 0.0–1.2)
CO2: 22 mmol/L (ref 20–29)
Calcium: 9.8 mg/dL (ref 8.7–10.3)
Chloride: 100 mmol/L (ref 96–106)
Creatinine, Ser: 0.85 mg/dL (ref 0.57–1.00)
Globulin, Total: 2.8 g/dL (ref 1.5–4.5)
Glucose: 92 mg/dL (ref 70–99)
Potassium: 5.1 mmol/L (ref 3.5–5.2)
Sodium: 137 mmol/L (ref 134–144)
Total Protein: 7.2 g/dL (ref 6.0–8.5)
eGFR: 72 mL/min/{1.73_m2} (ref >59)

## 2022-03-24 LAB — CARDIOVASCULAR RISK ASSESSMENT

## 2022-04-13 ENCOUNTER — Ambulatory Visit (INDEPENDENT_AMBULATORY_CARE_PROVIDER_SITE_OTHER): Payer: Medicare HMO

## 2022-04-13 DIAGNOSIS — Z7901 Long term (current) use of anticoagulants: Secondary | ICD-10-CM

## 2022-04-13 DIAGNOSIS — E038 Other specified hypothyroidism: Secondary | ICD-10-CM

## 2022-04-13 DIAGNOSIS — E782 Mixed hyperlipidemia: Secondary | ICD-10-CM

## 2022-04-13 NOTE — Progress Notes (Signed)
Chronic Care Management Pharmacy Note  04/13/2022 Name:  Anna Hayes MRN:  [id number] DOB:  01/20/1951  Summary: -Pleasant 71 year old female presents for initial CCM visit. She has been a widow for 10 years and lives with her daughter. 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 Her left knee was amputated in 2021 and she struggles with it. She was doing [employer] as exercise but is hesitant to go back due to Covid  Recommendations/Changes made from today's visit: -None, patient was taking a nap and not interested in talking much    Subjective: Anna Hayes is an 71 y.o. year old female who is a primary patient of Rip Harbour, NP.  The CCM team was consulted for assistance with disease management and care coordination needs.    Engaged with patient by telephone for follow up visit in response to provider referral for pharmacy case management and/or care coordination services.   Consent to Services:  The patient was given the following information about Chronic Care Management services today, agreed to services, and gave verbal consent: 1. CCM service includes personalized support from designated clinical staff supervised by the primary care provider, including individualized plan of care and coordination with other care providers 2. 24/7 contact phone numbers for assistance for urgent and routine care needs. 3. Service will only be billed when office clinical staff spend 20 minutes or more in a month to coordinate care. 4. Only one practitioner may furnish and bill the service in a calendar month. 5.The patient may stop CCM services at any time (effective at the end of the month) by phone call to the office staff. 6. The patient will be responsible for cost sharing (co-pay) of up to 20% of the service fee (after annual deductible  is met). Patient agreed to services and consent obtained.  Patient Care Team: Rip Harbour, NP as PCP - General (Nurse Practitioner) Lane Hacker, [facility] (Pharmacist)  Recent office visits:  12-14-2021 Rip Harbour, NP. MCH= 25.3, RDW= 16.1, Platelets= 495. Glucose= 100, Creatinine= 1.04, eGFR= 57, Potassium= 5.7. Trig= 189. MM Digital Screening ordered. START flonase 2 sprays into both nostrils daily and zofran 8 mg every 8 hours as needed   Recent consult visits:  None   Hospital visits:  None in previous 6 months   Objective:  Lab Results  Component Value Date   CREATININE 0.85 03/23/2022   BUN 16 03/23/2022   GFRNONAA 60 08/19/2020   GFRAA 70 08/19/2020   NA 137 03/23/2022   K 5.1 03/23/2022   CALCIUM 9.8 03/23/2022   CO2 22 03/23/2022   GLUCOSE 92 03/23/2022    No results found for: "HGBA1C", "FRUCTOSAMINE", "GFR", "MICROALBUR"  Last diabetic Eye exam: No results found for: "HMDIABEYEEXA"  Last diabetic Foot exam: No results found for: "HMDIABFOOTEX"   Lab Results  Component Value Date   CHOL 131 03/23/2022   HDL 46 03/23/2022   LDLCALC 57 03/23/2022   TRIG 168 (H) 03/23/2022   CHOLHDL 2.8 03/23/2022       Latest Ref Rng & Units 03/23/2022    2:31 PM 12/14/2021    3:36 PM 09/08/2021    3:18 PM  Hepatic Function  Total Protein 6.0 - 8.5 g/dL 7.2  7.6  7.5   Albumin 3.7 -  4.7 g/dL 4.4  4.5  4.4   AST 0 - 40 IU/L _0 ALT 0 - 32 IU/L _1 Alk Phosphatase 44 - 121 IU/L 74  91  83   Total Bilirubin 0.0 - 1.2 mg/dL 0.2  <0.2  0.2     Lab Results  Component Value Date/Time   TSH 3.490 12/14/2021 03:36 PM   TSH 3.240 06/02/2021 03:07 PM       Latest Ref Rng & Units 03/23/2022    2:31 PM 12/14/2021    3:36 PM 09/08/2021    3:18 PM  CBC  WBC 3.4 - 10.8 x10E3/uL 7.5  7.7  8.7   Hemoglobin 11.1 - 15.9 g/dL 11.2  11.9  11.7   Hematocrit 34.0 - 46.6 % 35.5  37.3  34.9   Platelets 150 - 450 x10E3/uL 413  495  470     No results  found for: "VD25OH"  Clinical ASCVD: Yes  The ASCVD Risk score (Arnett DK, et al., 2019) failed to calculate for the following reasons:   The patient has a prior MI or stroke diagnosis       07/23/2021   10:41 AM 11/24/2020   11:14 AM 07/21/2020    3:04 PM  Depression screen PHQ 2/9  Decreased Interest 0 0 0  Down, Depressed, Hopeless 0 0 0  PHQ - 2 Score 0 0 0     Other: (CHADS2VASc if Afib, MMRC or CAT for COPD, ACT, DEXA)  Social History   Tobacco Use  Smoking Status Some Days   Packs/day: 0.25   Years: 53.00   Total pack years: 13.25   Types: Cigarettes  Smokeless Tobacco Never   BP Readings from Last 3 Encounters:  03/23/22 132/66  12/14/21 134/70  09/08/21 (!) 152/88   Pulse Readings from Last 3 Encounters:  03/23/22 66  12/14/21 65  09/08/21 70   Wt Readings from Last 3 Encounters:  12/14/21 192 lb (87.1 kg)  06/02/21 192 lb (87.1 kg)  03/01/21 192 lb (87.1 kg)   BMI Readings from Last 3 Encounters:  12/14/21 34.56 kg/m  09/08/21 34.56 kg/m  06/02/21 35.12 kg/m    Assessment/Interventions: Review of patient past medical history, allergies, medications, health status, including review of consultants reports, laboratory and other test data, was performed as part of comprehensive evaluation and provision of chronic care management services.   SDOH:  (Social Determinants of Health) assessments and interventions performed: Yes SDOH Interventions    Flowsheet Row Most Recent Value  SDOH Interventions   Financial Strain Interventions Intervention Not Indicated  Transportation Interventions Intervention Not Indicated      SDOH Screenings   Alcohol Screen: Low Risk  (07/23/2021)   Alcohol Screen    Last Alcohol Screening Score (AUDIT): 0  Depression (PHQ2-9): Low Risk  (07/23/2021)   Depression (PHQ2-9)    PHQ-2 Score: 0  Financial Resource Strain: Low Risk  (04/13/2022)   Overall Financial Resource Strain (CARDIA)    Difficulty of Paying Living  Expenses: Not hard at all  Food Insecurity: No Food Insecurity (07/23/2021)   Hunger Vital Sign    Worried About Running Out of Food in the Last Year: Never true    Ran Out of Food in the Last Year: Never true  Housing: Low Risk  (07/23/2021)   Housing    Last Housing Risk Score: 0  Physical Activity: Not on file  Social Connections: Not on file  Stress: Not on file  Tobacco Use: High Risk (03/24/2022)   Patient History    Smoking Tobacco Use: Some Days    Smokeless Tobacco Use: Never    Passive Exposure: Not on file  Transportation Needs: No Transportation Needs (04/13/2022)   PRAPARE - Transportation    Lack of Transportation (Medical): No    Lack of Transportation (Non-Medical): No    CCM Care Plan  Allergies  Allergen Reactions   Latex Other (See Comments)    Blisters   Codeine Other (See Comments)    UNSPECIFIED REACTION    Lyrica [Pregabalin] Hives   Nurtec [Rimegepant Sulfate] Other (See Comments)    Stomach upset   Penicillins Hives, Swelling and Other (See Comments)    Has patient had a PCN reaction causing immediate rash, facial/tongue/throat swelling, SOB or lightheadedness with hypotension: Yes Has patient had a PCN reaction causing severe rash involving mucus membranes or skin necrosis: No Has patient had a PCN reaction that required hospitalization: No Has patient had a PCN reaction occurring within the last 10 years: No If all of the above answers are "NO", then may proceed with Cephalosporin use.    Amlodipine Other (See Comments)    Pedal edema   Aspirin Palpitations and Other (See Comments)    Abdominal pain   Sulfa Antibiotics Rash    Medications Reviewed Today     Reviewed by Lane Hacker, [facility] (Pharmacist) on 04/13/22 at [place] List Status: <None>   Medication Order Taking? Sig Documenting Provider Last Dose Status Informant  atenolol (TENORMIN) 50 MG tablet [id number] No TAKE 1 TABLET BY MOUTH EVERY DAY Montez Morita, Thornell Mule, NP Taking Active    butalbital-acetaminophen-caffeine (FIORICET) 50-325-40 MG tablet [id number] No Take 1 tablet by mouth every 6 (six) hours as needed for migraine. Rip Harbour, NP Taking Active   cyanocobalamin (,VITAMIN B-12,) 1000 MCG/ML injection [id number] No Inject into the muscle. [provider] Taking Active   fluticasone (FLONASE) 50 MCG/ACT nasal spray [id number] No Place 2 sprays into both nostrils daily. Rip Harbour, NP Taking Active   furosemide (LASIX) 20 MG tablet [id number] No Take 20 mg by mouth daily as needed for fluid.  [provider] Taking Active Self           Med Note Brett Albino, Shaune Pollack   Wed Jun 28, 2017 12:15 PM)    levothyroxine (SYNTHROID) 25 MCG tablet [id number] No Take 1 tablet (25 mcg total) by mouth daily. Rip Harbour, NP Taking Active   LINZESS 145 MCG CAPS capsule [id number] No TAKE 1 CAPSULE BY MOUTH EVERY DAY BEFORE BREAKFAST Rip Harbour, NP Taking Active   lisinopril (ZESTRIL) 20 MG tablet [id number] No Take 1 tablet (20 mg total) by mouth daily. Rip Harbour, NP Taking Active   omega-3 acid ethyl esters (LOVAZA) 1 g capsule [id number] No TAKE 2 CAPSULES BY MOUTH 2 TIMES DAILY. Cox, Kirsten, MD Taking Active   omeprazole (PRILOSEC) 40 MG capsule [id number] No TAKE 1 CAPSULE BY MOUTH EVERY DAY Cox, Kirsten, MD Taking Active   ondansetron (ZOFRAN) 4 MG tablet [id number]  Take 1 tablet (4 mg total) by mouth every 8 (eight) hours as needed for nausea or vomiting. Rip Harbour, NP  Active   promethazine (PHENERGAN) 25 MG tablet [id number] No Take 1 tablet (25 mg total) by mouth every 8 (eight) hours as needed. Rip Harbour, NP Taking Active   rivaroxaban (XARELTO) 20 MG TABS tablet [id number] No Take 1  tablet (20 mg total) by mouth daily with supper. Rip Harbour, NP Taking Active   simvastatin (ZOCOR) 40 MG tablet [id number]  TAKE 1 TABLET BY MOUTH EVERY DAY AT Michele Mcalpine, NP  Active             Patient Active  Problem List   Diagnosis Date Noted   Acquired absence of left leg above knee ([place]) 12/01/2019   Mixed hyperlipidemia 11/18/2019   Secondary hypothyroidism 11/18/2019   Spastic hemiparesis of left nondominant side ([place]) 12/27/2017   Migraine headache without aura 08/29/2017   Amputation of left lower extremity above knee upon examination ([place]) 07/12/2017   Amputation above knee ([place])    Neuropathic pain    Benign essential HTN    Slow transit constipation    DNR (do not resuscitate) discussion    Palliative care by specialist    Ischemic neuropathy of left foot    History of CVA with residual deficit    Tobacco abuse    PVD (peripheral vascular disease) ([place]) 06/27/2017   Stroke ([place])     Immunization History  Administered Date(s) Administered   Fluad Quad(high Dose 65+) 06/18/2020, 06/02/2021   Influenza, High Dose Seasonal PF 07/02/2017   Influenza-Unspecified 07/27/2018   PFIZER(Purple Top)SARS-COV-2 Vaccination 12/09/2019, 01/06/2020, 08/27/2020   Pfizer Covid-19 Vaccine Bivalent Booster 62yr & up 09/08/2021   Pneumococcal Conjugate-13 09/14/2015   Pneumococcal Polysaccharide-23 07/31/2012, 08/06/2018    Conditions to be addressed/monitored:  Hypertension and Hyperlipidemia  Care Plan : [place]y Updates made by KLane Hacker RPH since 04/13/2022 12:00 AM     Problem: HTN, Thyroid, Anticoag, Lipids   Priority: High  Onset Date: 07/02/2021     Goal: Disease State Management   Start Date: 07/02/2021  Expected End Date: 07/02/2022  Recent Progress: On track  Priority: High  Note:   Current Barriers:  Does not maintain contact with provider office Does not contact provider office for questions/concerns  Pharmacist Clinical Goal(s):  Patient will contact provider office for questions/concerns as evidenced notation of same in electronic health record through collaboration with PharmD and provider.   Interventions: 1:1 collaboration with HRip Harbour NP regarding development and update of comprehensive plan of care as evidenced by provider attestation and co-signature Inter-disciplinary care team collaboration (see longitudinal plan of care) Comprehensive medication review performed; medication list updated in electronic medical record  Cardio (BP goal <140/90) BP Readings from Last 3 Encounters:  03/23/22 132/66  12/14/21 134/70  09/08/21 (!) 152/88  -Controlled -Current treatment: Lisinopril 267mAppropriate, Effective, Safe, Accessible Atenolol 5079mppropriate, Effective, Safe, Accessible Furosemide 55m63mN Appropriate, Effective, Safe, Accessible -Medications previously tried: N/A  -Current home readings: Not testing -Current dietary habits: "Tries to eat healthy"  -Current exercise habits: Was doing "Silver Sneakers" but stopped once Covid started and hasn't been back -Denies hypotensive/hypertensive symptoms -Educated on BP goals and benefits of medications for prevention of heart attack, stroke and kidney damage; Extensive time spent on counseling patient on blood pressure goal and impact of each antihypertensive medication on their blood pressure and risk reduction for CV disease.  Used analogies to explain the need for multiple antihypertensive medications to achieve BP goals and that it is often a silent disease with no symptoms.  -Counseled to monitor BP at home Weekly, document, and provide log at future appointments October 2022: Counseled patient on importance of BP readings and about possibly going back to [employer] afraid of Covid  but is not up to date on vaccinations (Has 3/4 shots). Recommend she get 4th booster and then possibly try exercising July 2023: When I called patient she was sonically upset. She stated I "Interrupted her nap". I asked if she had gotten a reminder call and she stated she did but that she forgot and took a nap. I asked what her BP was and she said she documents it but  didn't want to get up and get the numbers, she wanted to go back to sleep.   Hyperlipidemia: (LDL goal < 100) The ASCVD Risk score (Arnett DK, et al., 2019) failed to calculate for the following reasons:   The patient has a prior MI or stroke diagnosis Lab Results  Component Value Date   CHOL 131 03/23/2022   CHOL 173 12/14/2021   CHOL 163 09/08/2021   Lab Results  Component Value Date   HDL 46 03/23/2022   HDL 48 12/14/2021   HDL 46 09/08/2021   Lab Results  Component Value Date   LDLCALC 57 03/23/2022   LDLCALC 93 12/14/2021   LDLCALC 81 09/08/2021   Lab Results  Component Value Date   TRIG 168 (H) 03/23/2022   TRIG 189 (H) 12/14/2021   TRIG 215 (H) 09/08/2021   Lab Results  Component Value Date   CHOLHDL 2.8 03/23/2022   CHOLHDL 3.6 12/14/2021   CHOLHDL 3.5 09/08/2021  No results found for: "LDLDIRECT" -Controlled -Current treatment: Simvastatin 42m Appropriate, Effective, Safe, Accessible -Medications previously tried: N/A  -Current dietary habits: "Tries to eat healthy"  -Current exercise habits: Was doing "Silver Sneakers" but stopped once Covid started and hasn't been back -Educated on Cholesterol goals; .lipiplan -Recommended to continue current medication (Could try high intensity statin due to risk factors but labs look at goal, patient is content maintaining status quo)  Thyroid Lab Results  Component Value Date   TSH 3.490 12/14/2021  -Controlled -Current treatment  Levothyroxine 224m Appropriate, Effective, Safe, Accessible -Medications previously tried: N/A  -Recommended to continue current medication  Migraine (Goal: Decrease amount of Migraines) -Controlled -Current treatment: Butalbital Appropriate, Effective, Safe, Accessible -Medications previously tried: N/A  -Aura warning S/S: Didn't specify -Triggers:Didn't specify -Counseled on tracking auras and triggers -Recommended to continue current medication  Anticoagulation -Acquired  Thrombophilia -Controlled -Current treatment  Xarelto 2067ms Appropriate, Effective, Safe, Accessible -Medications previously tried: N/A  -Recommended to continue current medication  Patient Goals/Self-Care Activities Patient will:  - take medications as prescribed  Follow Up Plan: The patient has been provided with contact information for the care management team and has been advised to call with any health related questions or concerns.   NatArizona Constableharm.D. - [phone]  CPP F/U July 2023       Medication Assistance: None required.  Patient affirms current coverage meets needs.  Compliance/Adherence/Medication fill history: [facility]: Lisinopril 20 mg- Last filled 02-27-2022 90 DS Simvastatin 40 mg- Last filled 03-11-2022 90 DS  Patient's preferred pharmacy is:  [id number], [place] - [id number]: [phone]: [phone] Plan and Follow Up Patient Decision:  Patient agrees to Care Plan and Follow-up.  Plan: The patient has been provided with contact information for the care management team and has been advised to call with any health related questions or concerns.   NathArizona Constablearm.D. - [phone]  CPP F/U April 2024

## 2022-04-13 NOTE — Patient Instructions (Signed)
Visit Information   Goals Addressed   None    Patient Care Plan: CCM Pharmacy Care Plan     Problem Identified: HTN, Thyroid, Anticoag, Lipids   Priority: High  Onset Date: 07/02/2021     Goal: Disease State Management   Start Date: 07/02/2021  Expected End Date: 07/02/2022  Recent Progress: On track  Priority: High  Note:   Current Barriers:  Does not maintain contact with provider office Does not contact provider office for questions/concerns  Pharmacist Clinical Goal(s):  Patient will contact provider office for questions/concerns as evidenced notation of same in electronic health record through collaboration with PharmD and provider.   Interventions: 1:1 collaboration with Rip Harbour, NP regarding development and update of comprehensive plan of care as evidenced by provider attestation and co-signature Inter-disciplinary care team collaboration (see longitudinal plan of care) Comprehensive medication review performed; medication list updated in electronic medical record  Cardio (BP goal <140/90) BP Readings from Last 3 Encounters:  03/23/22 132/66  12/14/21 134/70  09/08/21 (!) 152/88  -Controlled -Current treatment: Lisinopril '20mg'$  Appropriate, Effective, Safe, Accessible Atenolol '50mg'$  Appropriate, Effective, Safe, Accessible Furosemide '20mg'$  PRN Appropriate, Effective, Safe, Accessible -Medications previously tried: N/A  -Current home readings: Not testing -Current dietary habits: "Tries to eat healthy"  -Current exercise habits: Was doing "Silver Sneakers" but stopped once Covid started and hasn't been back -Denies hypotensive/hypertensive symptoms -Educated on BP goals and benefits of medications for prevention of heart attack, stroke and kidney damage; Extensive time spent on counseling patient on blood pressure goal and impact of each antihypertensive medication on their blood pressure and risk reduction for CV disease.  Used analogies to explain the need  for multiple antihypertensive medications to achieve BP goals and that it is often a silent disease with no symptoms.  -Counseled to monitor BP at home Weekly, document, and provide log at future appointments October 2022: Counseled patient on importance of BP readings and about possibly going back to [employer]. She's afraid of Covid but is not up to date on vaccinations (Has 3/4 shots). Recommend she get 4th booster and then possibly try exercising July 2023: When I called patient she was sonically upset. She stated I "Interrupted her nap". I asked if she had gotten a reminder call and she stated she did but that she forgot and took a nap. I asked what her BP was and she said she documents it but didn't want to get up and get the numbers, she wanted to go back to sleep.   Hyperlipidemia: (LDL goal < 100) The ASCVD Risk score (Arnett DK, et al., 2019) failed to calculate for the following reasons:   The patient has a prior MI or stroke diagnosis Lab Results  Component Value Date   CHOL 131 03/23/2022   CHOL 173 12/14/2021   CHOL 163 09/08/2021   Lab Results  Component Value Date   HDL 46 03/23/2022   HDL 48 12/14/2021   HDL 46 09/08/2021   Lab Results  Component Value Date   LDLCALC 57 03/23/2022   LDLCALC 93 12/14/2021   LDLCALC 81 09/08/2021   Lab Results  Component Value Date   TRIG 168 (H) 03/23/2022   TRIG 189 (H) 12/14/2021   TRIG 215 (H) 09/08/2021   Lab Results  Component Value Date   CHOLHDL 2.8 03/23/2022   CHOLHDL 3.6 12/14/2021   CHOLHDL 3.5 09/08/2021  No results found for: "LDLDIRECT" -Controlled -Current treatment: Simvastatin '40mg'$  Appropriate, Effective, Safe, Accessible -Medications previously tried:  N/A  -Current dietary habits: "Tries to eat healthy"  -Current exercise habits: Was doing "Silver Sneakers" but stopped once Covid started and hasn't been back -Educated on Cholesterol goals; .lipiplan -Recommended to continue current medication (Could  try high intensity statin due to risk factors but labs look at goal, patient is content maintaining status quo)  Thyroid Lab Results  Component Value Date   TSH 3.490 12/14/2021  -Controlled -Current treatment  Levothyroxine 62mg Appropriate, Effective, Safe, Accessible -Medications previously tried: N/A  -Recommended to continue current medication  Migraine (Goal: Decrease amount of Migraines) -Controlled -Current treatment: Butalbital Appropriate, Effective, Safe, Accessible -Medications previously tried: N/A  -Aura warning S/S: Didn't specify -Triggers:Didn't specify -Counseled on tracking auras and triggers -Recommended to continue current medication  Anticoagulation -Acquired Thrombophilia -Controlled -Current treatment  Xarelto '20mg'$  hs Appropriate, Effective, Safe, Accessible -Medications previously tried: N/A  -Recommended to continue current medication  Patient Goals/Self-Care Activities Patient will:  - take medications as prescribed  Follow Up Plan: The patient has been provided with contact information for the care management team and has been advised to call with any health related questions or concerns.   NArizona Constable Pharm.D. -- [phone] CPP F/U July 2023      Ms. WAlwinwas given information about Chronic Care Management services today including:  CCM service includes personalized support from designated clinical staff supervised by her physician, including individualized plan of care and coordination with other care providers 24/7 contact phone numbers for assistance for urgent and routine care needs. Standard insurance, coinsurance, copays and deductibles apply for chronic care management only during months in which we provide at least 20 minutes of these services. Most insurances cover these services at 100%, however patients may be responsible for any copay, coinsurance and/or deductible if applicable. This service may help you avoid the need  for more expensive face-to-face services. Only one practitioner may furnish and bill the service in a calendar month. The patient may stop CCM services at any time (effective at the end of the month) by phone call to the office staff.  Patient agreed to services and verbal consent obtained.   The patient verbalized understanding of instructions, educational materials, and care plan provided today and DECLINED offer to receive copy of patient instructions, educational materials, and care plan.  The pharmacy team will reach out to the patient again over the next 60 days.   NLane Hacker RSt. John the Baptist

## 2022-04-23 ENCOUNTER — Other Ambulatory Visit: Payer: Self-pay | Admitting: Nurse Practitioner

## 2022-04-23 DIAGNOSIS — E038 Other specified hypothyroidism: Secondary | ICD-10-CM

## 2022-04-25 DIAGNOSIS — E038 Other specified hypothyroidism: Secondary | ICD-10-CM

## 2022-04-25 DIAGNOSIS — E782 Mixed hyperlipidemia: Secondary | ICD-10-CM

## 2022-04-29 ENCOUNTER — Other Ambulatory Visit: Payer: Self-pay | Admitting: Nurse Practitioner

## 2022-04-29 DIAGNOSIS — E782 Mixed hyperlipidemia: Secondary | ICD-10-CM

## 2022-05-05 ENCOUNTER — Other Ambulatory Visit: Payer: Self-pay

## 2022-05-05 MED ORDER — RIVAROXABAN 20 MG PO TABS: 20.0000 mg | ORAL_TABLET | Freq: Every day | ORAL | 1 refills | Status: DC

## 2022-05-09 ENCOUNTER — Telehealth: Payer: Self-pay

## 2022-05-09 NOTE — Chronic Care Management (AMB) (Signed)
Chronic Care Management Pharmacy Assistant   Name: Anna Hayes  MRN: [id number] DOB: Mar 18, 1951  Reason for Encounter: Disease State/ Hypertension  Recent office visits:  None  Recent consult visits:  None  Hospital visits:  None in previous 6 months  Medications: Outpatient Encounter Medications as of 05/09/2022  Medication Sig   atenolol (TENORMIN) 50 MG tablet TAKE 1 TABLET BY MOUTH EVERY DAY   butalbital-acetaminophen-caffeine (FIORICET) 50-325-40 MG tablet Take 1 tablet by mouth every 6 (six) hours as needed for migraine.   cyanocobalamin (,VITAMIN B-12,) 1000 MCG/ML injection Inject into the muscle.   fluticasone (FLONASE) 50 MCG/ACT nasal spray Place 2 sprays into both nostrils daily.   furosemide (LASIX) 20 MG tablet Take 20 mg by mouth daily as needed for fluid.    levothyroxine (SYNTHROID) 25 MCG tablet TAKE 1 TABLET BY MOUTH EVERY DAY   LINZESS 145 MCG CAPS capsule TAKE 1 CAPSULE BY MOUTH EVERY DAY BEFORE BREAKFAST   lisinopril (ZESTRIL) 20 MG tablet Take 1 tablet (20 mg total) by mouth daily.   omega-3 acid ethyl esters (LOVAZA) 1 g capsule TAKE 2 CAPSULES BY MOUTH 2 TIMES DAILY.   omeprazole (PRILOSEC) 40 MG capsule TAKE 1 CAPSULE BY MOUTH EVERY DAY   ondansetron (ZOFRAN) 4 MG tablet Take 1 tablet (4 mg total) by mouth every 8 (eight) hours as needed for nausea or vomiting.   promethazine (PHENERGAN) 25 MG tablet Take 1 tablet (25 mg total) by mouth every 8 (eight) hours as needed.   rivaroxaban (XARELTO) 20 MG TABS tablet Take 1 tablet (20 mg total) by mouth daily with supper.   simvastatin (ZOCOR) 40 MG tablet TAKE 1 TABLET BY MOUTH EVERY DAY AT NIGHT   No facility-administered encounter medications on file as of 05/09/2022.    Recent Office Vitals: BP Readings from Last 3 Encounters:  03/23/22 132/66  12/14/21 134/70  09/08/21 (!) 152/88   Pulse Readings from Last 3 Encounters:  03/23/22 66  12/14/21 65  09/08/21 70    Wt Readings from Last 3  Encounters:  12/14/21 192 lb (87.1 kg)  06/02/21 192 lb (87.1 kg)  03/01/21 192 lb (87.1 kg)     Kidney Function Lab Results  Component Value Date/Time   CREATININE 0.85 03/23/2022 02:31 PM   CREATININE 1.04 (H) 12/14/2021 03:36 PM   CREATININE 0.47 (L) 08/31/2017 05:20 PM   GFRNONAA 60 08/19/2020 01:41 PM   GFRNONAA 101 08/31/2017 05:20 PM   GFRAA 70 08/19/2020 01:41 PM   GFRAA 118 08/31/2017 05:20 PM       Latest Ref Rng & Units 03/23/2022    2:31 PM 12/14/2021    3:36 PM 09/08/2021    3:18 PM  BMP  Glucose 70 - 99 mg/dL 92  100  90   BUN 8 - 27 mg/dL '16  15  14   '$ Creatinine 0.57 - 1.00 mg/dL 0.85  1.04  1.03   BUN/Creat Ratio 12 - '28 19  14  14   '$ Sodium 134 - 144 mmol/L 137  138  136   Potassium 3.5 - 5.2 mmol/L 5.1  5.7  5.5   Chloride 96 - 106 mmol/L 100  101  100   CO2 20 - 29 mmol/L '22  20  23   '$ Calcium 8.7 - 10.3 mg/dL 9.8  9.9  9.7      Current antihypertensive regimen:  Lisinopril 20 mg daily Atenolol 50 mg daily Furosemide 20 mg PRN  Patient verbally confirms she  is taking the above medications as directed. Yes  How often are you checking your Blood Pressure? 1-2x per week  she checks her blood pressure in the morning after taking her medication.  Current home BP readings: 08-23 163/88, 2nd time 139/66. 08-21 132/64, 08-14 134/70. Wrist or arm cuff: Wrist and cuff Caffeine intake: 2 cans of soda weekly Salt intake: Limited OTC medications including pseudoephedrine or NSAIDs? Patient stated tylenol and sinus medication  Any readings above 180/120? No  What recent interventions/DTPs have been made by any provider to improve Blood Pressure control since last CPP Visit:  Educated on BP goals and benefits of medications for prevention of heart attack, stroke and kidney damage; Extensive time spent on counseling patient on blood pressure goal and impact of each antihypertensive medication on their blood pressure and risk reduction for CV disease.  Used  analogies to explain the need for multiple antihypertensive medications to achieve BP goals and that it is often a silent disease with no symptoms.  -Counseled to monitor BP at home Weekly, document, and provide log at future appointments  Any recent hospitalizations or ED visits since last visit with CPP? No  What diet changes have been made to improve Blood Pressure Control?  Patient stated she limits salt intake and drinks plenty of water daily.  What exercise is being done to improve your Blood Pressure Control?  Patient stated she is active daily with cleaning. Patient states she plans on going back to the [facility] soon.  Adherence Review: Is the patient currently on ACE/ARB medication? Yes Does the patient have >5 day gap between last estimated fill dates? CPP to review  Care Gaps: Last annual wellness visit? 07-23-2021 Tdap overdue Shingrix overdue Covid booster overdue Flu vaccine overdue   Star Rating Drugs: Lisinopril 20 mg- Last filled 02-27-2022 90 DS Simvastatin 40 mg- Last filled 03-25-2022 90 DS  [place] Clinical Pharmacist Assistant [phone]

## 2022-05-25 ENCOUNTER — Other Ambulatory Visit: Payer: Self-pay

## 2022-05-25 MED ORDER — LINACLOTIDE 145 MCG PO CAPS: ORAL_CAPSULE | ORAL | 1 refills | Status: DC

## 2022-06-01 ENCOUNTER — Other Ambulatory Visit: Payer: Self-pay | Admitting: Nurse Practitioner

## 2022-06-01 DIAGNOSIS — J3089 Other allergic rhinitis: Secondary | ICD-10-CM

## 2022-06-15 ENCOUNTER — Telehealth: Payer: Self-pay

## 2022-06-15 NOTE — Patient Outreach (Signed)
  Care Coordination   06/15/2022 Name: Anna Hayes MRN: [id number] DOB: Apr 21, 1951   Care Coordination Outreach Attempts:  An unsuccessful telephone outreach was attempted today to offer the patient information about available care coordination services as a benefit of their health plan.   Follow Up Plan:  Additional outreach attempts will be made to offer the patient care coordination information and services.   Encounter Outcome:  No Answer  Care Coordination Interventions Activated:  No   Care Coordination Interventions:  No, not indicated    Tomasa Rand, RN, BSN, CEN [facility] [employer] [phone]

## 2022-06-24 ENCOUNTER — Ambulatory Visit: Payer: Medicare HMO | Admitting: Nurse Practitioner

## 2022-06-29 ENCOUNTER — Encounter: Payer: Self-pay | Admitting: Nurse Practitioner

## 2022-06-29 ENCOUNTER — Other Ambulatory Visit: Payer: Self-pay | Admitting: Nurse Practitioner

## 2022-06-29 ENCOUNTER — Ambulatory Visit (INDEPENDENT_AMBULATORY_CARE_PROVIDER_SITE_OTHER): Payer: Medicare HMO | Admitting: Nurse Practitioner

## 2022-06-29 VITALS — BP 144/80 | HR 86 | Temp 97.1°F | Ht 62.5 in | Wt 185.0 lb

## 2022-06-29 DIAGNOSIS — M24542 Contracture, left hand: Secondary | ICD-10-CM | POA: Diagnosis not present

## 2022-06-29 DIAGNOSIS — E6609 Other obesity due to excess calories: Secondary | ICD-10-CM

## 2022-06-29 DIAGNOSIS — Z6833 Body mass index (BMI) 33.0-33.9, adult: Secondary | ICD-10-CM

## 2022-06-29 DIAGNOSIS — Z23 Encounter for immunization: Secondary | ICD-10-CM | POA: Diagnosis not present

## 2022-06-29 DIAGNOSIS — Z7901 Long term (current) use of anticoagulants: Secondary | ICD-10-CM | POA: Diagnosis not present

## 2022-06-29 DIAGNOSIS — G8194 Hemiplegia, unspecified affecting left nondominant side: Secondary | ICD-10-CM

## 2022-06-29 DIAGNOSIS — I10 Essential (primary) hypertension: Secondary | ICD-10-CM | POA: Diagnosis not present

## 2022-06-29 DIAGNOSIS — E782 Mixed hyperlipidemia: Secondary | ICD-10-CM

## 2022-06-29 DIAGNOSIS — I6930 Unspecified sequelae of cerebral infarction: Secondary | ICD-10-CM | POA: Diagnosis not present

## 2022-06-29 DIAGNOSIS — M85859 Other specified disorders of bone density and structure, unspecified thigh: Secondary | ICD-10-CM | POA: Diagnosis not present

## 2022-06-29 DIAGNOSIS — G43009 Migraine without aura, not intractable, without status migrainosus: Secondary | ICD-10-CM

## 2022-06-29 DIAGNOSIS — E038 Other specified hypothyroidism: Secondary | ICD-10-CM | POA: Diagnosis not present

## 2022-06-29 DIAGNOSIS — I739 Peripheral vascular disease, unspecified: Secondary | ICD-10-CM | POA: Diagnosis not present

## 2022-06-29 DIAGNOSIS — S78112A Complete traumatic amputation at level between left hip and knee, initial encounter: Secondary | ICD-10-CM

## 2022-06-29 DIAGNOSIS — R110 Nausea: Secondary | ICD-10-CM

## 2022-06-29 DIAGNOSIS — R69 Illness, unspecified: Secondary | ICD-10-CM | POA: Diagnosis not present

## 2022-06-29 DIAGNOSIS — F17219 Nicotine dependence, cigarettes, with unspecified nicotine-induced disorders: Secondary | ICD-10-CM

## 2022-06-29 MED ORDER — SIMVASTATIN 40 MG PO TABS: ORAL_TABLET | ORAL | 0 refills | Status: DC

## 2022-06-29 MED ORDER — BUTALBITAL-APAP-CAFFEINE 50-325-40 MG PO TABS: 1.0000 | ORAL_TABLET | Freq: Four times a day (QID) | ORAL | 2 refills | Status: DC | PRN

## 2022-06-29 MED ORDER — ONDANSETRON HCL 4 MG PO TABS: 4.0000 mg | ORAL_TABLET | Freq: Three times a day (TID) | ORAL | 1 refills | Status: DC | PRN

## 2022-06-29 NOTE — Progress Notes (Signed)
Subjective:  Patient ID: Anna Hayes, female    DOB: 01/18/1951  Age: 71 y.o. MRN: [id number]  Chief Complaint  Patient presents with   Hypothyroidism   Hyperlipidemia   Hypertension    HPI   Anna Hayes is a 71 year old African-American female that presents for follow-up of hypertension, hyperlipidemia, and hypothyroidism. 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 PVD, had left AKA 2018 due to bilateral femoral artery embolus.Pt prescribed Xarelto 20 mg QD. Denies excessive bruising or bleeding. Current cigarette smoker. Chronic allergic rhinitis well-controlled with OTC antihistamine and Flonase. DEXA scan on 01/28/22 revealed osteopenia. Pt taking calcium and Vit D OTC. Mammogram normal 01/28/22. Colonoscopy with multiple polypectomy (19) with Dr Lyda Jester on 12/07/21), directed to repeat in 1-year.  Pt states she is concerned with her weight 185 lbs, BMI 33.3. states she will modify her diet and return to [facility] for regular physical activity. States she enjoyed going to the [facility] prior to the COVID-19 pandemic.  Hypertension, follow-up: She was last seen for hypertension 7 months ago.  BP at that visit was 134/70. Management includes Lisinopril and Atenolol. She reports excellent compliance with treatment. She is not having side effects.  She is following a Regular diet. She is not exercising. She does smoke.  Use of agents associated with hypertension: NSAIDS.   At home readings: 139/66, 129/74, 144/74, 146/76, 139/66 Pertinent labs: Lab Results  Component Value Date   CHOL 131 03/23/2022   HDL 46 03/23/2022   LDLCALC 57 03/23/2022   TRIG 168 (H) 03/23/2022   CHOLHDL 2.8 03/23/2022   Lab Results  Component Value Date   NA 137 03/23/2022   K 5.1 03/23/2022   CREATININE 0.85 03/23/2022   EGFR 72 03/23/2022   GFRNONAA 60 08/19/2020   GLUCOSE 92 03/23/2022      Lipid/Cholesterol, Follow-up  Last lipid panel Other pertinent labs   Lab Results  Component Value Date   CHOL 131 03/23/2022   HDL 46 03/23/2022   LDLCALC 57 03/23/2022   TRIG 168 (H) 03/23/2022   CHOLHDL 2.8 03/23/2022   Lab Results  Component Value Date   ALT 17 03/23/2022   AST 16 03/23/2022   PLT 413 03/23/2022   TSH 3.490 12/14/2021     She was last seen for this 7 months ago.  Management includes Simvastatin 40 and omega 3. She reports excellent compliance with treatment. She is not having side effects.   Hypothyroidism Current treatment is Levothyroxine 25 mcg daily. Last TSH 3.490 on 12/14/2021. She denies any current symptoms of hypothyroidism. She is adherent to medication regimen and follow-up appointments.   Current Outpatient Medications on File Prior to Visit  Medication Sig Dispense Refill   atenolol (TENORMIN) 50 MG tablet TAKE 1 TABLET BY MOUTH EVERY DAY 90 tablet 1   butalbital-acetaminophen-caffeine (FIORICET) 50-325-40 MG tablet Take 1 tablet by mouth every 6 (six) hours as needed for migraine. 30 tablet 2   cyanocobalamin (,VITAMIN B-12,) 1000 MCG/ML injection Inject into the muscle.     fluticasone (FLONASE) 50 MCG/ACT nasal spray SPRAY 2 SPRAYS INTO EACH NOSTRIL EVERY DAY 48 mL 2   furosemide (LASIX) 20 MG tablet Take 20 mg by mouth daily as needed for fluid.      levothyroxine (SYNTHROID) 25 MCG tablet TAKE 1 TABLET BY MOUTH EVERY DAY 90 tablet 2   linaclotide (LINZESS) 145 MCG CAPS capsule TAKE 1 CAPSULE BY MOUTH EVERY DAY BEFORE BREAKFAST 90 capsule 1  lisinopril (ZESTRIL) 20 MG tablet Take 1 tablet (20 mg total) by mouth daily. 90 tablet 3   omega-3 acid ethyl esters (LOVAZA) 1 g capsule TAKE 2 CAPSULES BY MOUTH 2 TIMES DAILY. 360 capsule 1   omeprazole (PRILOSEC) 40 MG capsule TAKE 1 CAPSULE BY MOUTH EVERY DAY 90 capsule 1   ondansetron (ZOFRAN) 4 MG tablet Take 1 tablet (4 mg total) by mouth every 8 (eight) hours as needed for nausea or vomiting. 90 tablet 1   promethazine (PHENERGAN) 25 MG tablet Take 1 tablet (25  mg total) by mouth every 8 (eight) hours as needed. 60 tablet 0   rivaroxaban (XARELTO) 20 MG TABS tablet Take 1 tablet (20 mg total) by mouth daily with supper. 90 tablet 1   simvastatin (ZOCOR) 40 MG tablet TAKE 1 TABLET BY MOUTH EVERY DAY AT NIGHT 90 tablet 0   No current facility-administered medications on file prior to visit.   Past Medical History:  Diagnosis Date   Anemia    Constipation    DVT of lower extremity (deep venous thrombosis) (HCC)    Frequent headaches    Heart murmur    found during pregnancy -  no problems   Hypertension    Muscle pain    Palpitations    Septic thrombophlebitis 08/31/2017   Sinus problem    Stroke [facility])    Past Surgical History:  Procedure Laterality Date   AMPUTATION Left 07/05/2017   Procedure: LEFT ABOVE KNEE AMPUTATION;  Surgeon: Conrad [place], MD;  Location: [place];  Service: Vascular;  Laterality: Left;   CATARACT EXTRACTION W/PHACO Right 06/12/2018   Procedure: CATARACT EXTRACTION WITH INTRAOCULAR LENS IMPLANT;  Surgeon: Jalene Mullet, MD;  Location: [place];  Service: Ophthalmology;  Laterality: Right;   COLONOSCOPY  11/26/2015   Dr Lyda Jester - follow-up in 5 years   EMBOLECTOMY Bilateral 06/27/2017   Procedure: BILATERAL FEMORAL POPLITEAL EMBOLECTOMY;  Surgeon: Serafina Mitchell, MD;  [facility];  Service: Vascular;  Laterality: Bilateral;   FASCIOTOMY Left 06/27/2017   Procedure: FULL COMPARTMENT LEFT LOWER LEG FASCIOTOMY;  Surgeon: Serafina Mitchell, MD;  Location: [place];  Service: Vascular;  Laterality: Left;   PARTIAL HYSTERECTOMY     PATCH ANGIOPLASTY Left 06/27/2017   Procedure: PATCH ANGIOPLASTY Left Posterior Tibial Artery;  Surgeon: Serafina Mitchell, MD;  Location: [place];  Service: Vascular;  Laterality: Left;   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 07/11/2017   Procedure: REPAIR OF COMPLEX TRACTION RETINAL DETACHMENT WITH ENDO LASER AND ANTIFUNGAL INJECTION;  Surgeon: Jalene Mullet, MD;  Location: [place];  Service: Ophthalmology;  Laterality: Right;   TEE WITHOUT CARDIOVERSION N/A 07/11/2017   Procedure: TRANSESOPHAGEAL ECHOCARDIOGRAM (TEE);  Surgeon: Acie Fredrickson Wonda Cheng, MD;  Location: [facility] ENDOSCOPY;  Service: Cardiovascular;  Laterality: N/A;    Family History  Problem Relation Age of Onset   Breast cancer Mother    Prostate cancer Father    Prostate cancer Brother    Social History   Socioeconomic History   Marital status: Widowed    Spouse name: Not on file   Number of children: Not on file   Years of education: Not on file   Highest education level: Not on file  Occupational History   Not on file  Tobacco Use   Smoking status: Some Days    Packs/day: 0.25    Years: 53.00    Total pack years: 13.25    Types: Cigarettes   Smokeless tobacco: Never  Vaping Use   Vaping Use: Never used  Substance and Sexual Activity   Alcohol use: No   Drug use: No   Sexual activity: Not Currently  Other Topics Concern   Not on file  Social History Narrative   Lives with her daughter   Social Determinants of Health   Financial Resource Strain: Low Risk  (04/13/2022)   Overall Financial Resource Strain (CARDIA)    Difficulty of Paying Living Expenses: Not hard at all  Food Insecurity: No Food Insecurity (07/23/2021)   Hunger Vital Sign    Worried About Running Out of Food in the Last Year: Never true    Ran Out of Food in the Last Year: Never true  Transportation Needs: No Transportation Needs (04/13/2022)   PRAPARE - Hydrologist (Medical): No    Lack of Transportation (Non-Medical): No  Physical Activity: Insufficiently Active (06/29/2022)   Exercise Vital Sign    Days of Exercise per Week: 1 day    Minutes of Exercise per Session: 60 min  Stress: No Stress Concern Present (06/29/2022)   [place]    Feeling of Stress : Not at all  Social Connections: Moderately Isolated (06/29/2022)    Social Connection and Isolation Panel [NHANES]    Frequency of Communication with Friends and Family: More than three times a week    Frequency of Social Gatherings with Friends and Family: More than three times a week    Attends Religious Services: 1 to 4 times per year    Active Member of [employer] or Organizations: No    Attends Archivist Meetings: Never    Marital Status: Widowed    Review of Systems  Constitutional:  Negative for chills, fatigue and fever.  HENT:  Positive for congestion. Negative for ear pain, rhinorrhea and sore throat.   Respiratory:  Negative for cough and shortness of breath.   Cardiovascular:  Negative for chest pain.  Gastrointestinal:  Negative for abdominal pain, constipation, diarrhea, nausea and vomiting.  Genitourinary:  Negative for dysuria and urgency.  Musculoskeletal:  Negative for back pain and myalgias.  Neurological:  Negative for dizziness, weakness, light-headedness and headaches.  Psychiatric/Behavioral:  Negative for dysphoric mood. The patient is not nervous/anxious.      Objective:  BP (!) 144/80   Pulse 86   Temp (!) 97.1 F (36.2 C)   Ht 5' 2.5" (1.588 m)   Wt 185 lb (83.9 kg)   SpO2 98%   BMI 33.30 kg/m       03/23/2022    2:07 PM 12/14/2021    3:08 PM 09/08/2021    2:58 PM  BP/Weight  Systolic BP 045 409 811  Diastolic BP 66 70 88  Wt. (Lbs)  192   BMI  34.56 kg/m2     Physical Exam Vitals reviewed.  Constitutional:      Appearance: Normal appearance.     Comments: Pt seated in manual w/c  HENT:     Right Ear: Tympanic membrane normal.     Left Ear: Tympanic membrane normal.     Mouth/Throat:     Pharynx: No posterior oropharyngeal erythema.  Cardiovascular:     Rate and Rhythm: Normal rate and regular rhythm.     Pulses: Normal pulses.     Heart sounds: Normal heart sounds.  Pulmonary:     Effort: Pulmonary effort is normal.     Comments: Lung sounds diminished in posterior  lower lobes Abdominal:      General: Bowel sounds are normal.     Palpations: Abdomen is soft.  Musculoskeletal:     Right hand: Normal.     Left hand: Deformity (left hand contracted) present. Decreased range of motion. Normal pulse.     Left Lower Extremity: Left leg is amputated above knee.  Skin:    General: Skin is warm and dry.     Capillary Refill: Capillary refill takes less than 2 seconds.  Neurological:     General: No focal deficit present.     Mental Status: She is alert.    Lab Results  Component Value Date   WBC 7.5 03/23/2022   HGB 11.2 03/23/2022   HCT 35.5 03/23/2022   PLT 413 03/23/2022   GLUCOSE 92 03/23/2022   CHOL 131 03/23/2022   TRIG 168 (H) 03/23/2022   HDL 46 03/23/2022   LDLCALC 57 03/23/2022   ALT 17 03/23/2022   AST 16 03/23/2022   NA 137 03/23/2022   K 5.1 03/23/2022   CL 100 03/23/2022   CREATININE 0.85 03/23/2022   BUN 16 03/23/2022   CO2 22 03/23/2022   TSH 3.490 12/14/2021   INR 1.21 06/12/2018      Assessment & Plan:   1. Mixed hyperlipidemia-not at goal - CBC with Differential/Platelet - Comprehensive metabolic panel - Lipid panel -continue Zocor 40 mg and Omega 3 -increase physical activity -heart healthy diet  2. Secondary hypothyroidism-well controlled - TSH - T4, Free -Continue Synthroid 25 mcg QD  3. PVD (peripheral vascular disease) (HCC)-stable - CBC with Differential/Platelet - Comprehensive metabolic panel -recommend smoking cessation -continue Xarelto 20 mg QD  4. Long term (current) use of anticoagulants - CBC with Differential/Platelet - Comprehensive metabolic panel -fall precautions  5. Benign essential HTN-not at goal - CBC with Differential/Platelet - Comprehensive metabolic panel - TSH - T4, Free -continue Lisinopril 20 mg QD  6. Unilateral AKA, left (HCC) -assist with ADLs as needed -fall precautions  7. Need for immunization against influenza - Flu Vaccine QUAD High Dose(Fluad)  8. Cigarette nicotine dependence  with nicotine-induced disorder - CBC with Differential/Platelet - Comprehensive metabolic panel - Lipid panel -recommend   9. Osteopenia of neck of femur, unspecified laterality - CBC with Differential/Platelet - Comprehensive metabolic panel -Vitamin D level  10. History of CVA with residual deficit - CBC with Differential/Platelet - Comprehensive metabolic panel - Lipid panel -continue Xarelto 20 mg QD -assist with   11. Left hemiplegia (HCC) - CBC with Differential/Platelet - Comprehensive metabolic panel  12. Contracture, left hand - CBC with Differential/Platelet - Comprehensive metabolic panel  13. Class 1 obesity due to excess calories with serious comorbidity and body mass index (BMI) of 33.0 to 33.9 in adult - CBC with Differential/Platelet - Comprehensive metabolic panel - Lipid panel - TSH - T4, Free -vitamin D  -heart healthy diet -increase physical activity     Continue medications We will call you with lab results Follow-up in 21-month, fasting   Follow-up: 377-month An After Visit Summary was printed and given to the patient.  I, ShRip HarbourNP, have reviewed all documentation for this visit. The documentation on 06/29/22 for the exam, diagnosis, procedures, and orders are all accurate and complete.    Signed,  ShRip HarbourNP [phone]

## 2022-06-29 NOTE — Patient Instructions (Addendum)
Continue medications We will call you with lab results Follow-up in 94-month, fasting     Peripheral Vascular Disease  Peripheral vascular disease (PVD) is a disease of the blood vessels that carry blood from the heart to the rest of the body. PVD is also called peripheral artery disease (PAD) or poor circulation. PVD affects most of the body. But it affects the legs and feet the most. PVD can lead to acute limb ischemia. This happens when there is a sudden stop of blood flow to an arm or leg. This is a medical emergency. What are the causes? The most common cause of PVD is a buildup of a fatty substance (plaque) inside your arteries. This decreases blood flow. Plaque can break off and block blood in a smaller artery. This can lead to acute limb ischemia. Other common causes of PVD include: Blood clots inside the blood vessels. Injuries to blood vessels. Irritation and swelling of blood vessels. Sudden tightening of the blood vessel (spasms). What increases the risk? A family history of PVD. Medical conditions, including: High cholesterol. Diabetes. High blood pressure. Heart disease. Past problems with blood clots. Past injury, such as burns or a broken bone. Other conditions, such as: Buerger's disease. This is caused by swollen or irritated blood vessels in your hands and feet. Arthritis. Birth defects that affect the arteries in your legs. Kidney disease. Using tobacco or nicotine products. Not getting enough exercise. Being very overweight (obese). Being 541years old or older. What are the signs or symptoms? Cramps in your butt, legs, and feet. Pain and weakness in your legs when you are active that goes away when you rest. Leg pain when at rest. Leg numbness, tingling, or weakness. Coldness in a leg or foot, especially when compared with the other leg or foot. Skin or hair changes. These can include: Hair loss. Shiny skin. Pale or bluish skin. Thick  toenails. Being unable to get or keep an erection. Tiredness (fatigue). Weak pulse or no pulse in the feet. Wounds and sores on the toes, feet, or legs. These take longer to heal. How is this treated? Underlying causes are treated first. Other conditions, like diabetes, high cholesterol, and blood pressure, are also treated. Treatment may include: Lifestyle changes, such as: Quitting smoking. Getting regular exercise. Having a diet low in fat and cholesterol. Not drinking alcohol. Taking medicines, such as: Blood thinners. Medicines to improve blood flow. Medicines to improve your blood cholesterol. Procedures to: Open the arteries and restore blood flow. Insert a small mesh tube (stent) to keep a blocked vessel open. Create a new path for blood to flow to the body (peripheral bypass). Remove dead tissue from a wound. Remove an affected leg or arm. Follow these instructions at home: Medicines Take over-the-counter and prescription medicines only as told by your doctor. If you are taking blood thinners: Talk with your doctor before you take any medicines that have aspirin, or NSAIDs, such as ibuprofen. Take medicines exactly as told. Take them at the same time each day. Avoid doing things that could hurt or bruise you. Take action to prevent falls. Wear an alert bracelet or carry a card that shows you are taking blood thinners. Lifestyle     Get regular exercise. Ask your doctor about how to stay active. Talk with your doctor about keeping a healthy weight. If needed, ask about losing weight. Eat a diet that is low in fat and cholesterol. If you need help, talk with your doctor. Do not drink  alcohol. Do not smoke or use any products that contain nicotine or tobacco. If you need help quitting, ask your doctor. General instructions Take good care of your feet. To do this: Wear shoes that fit well and feel good. Check your feet often for any cuts or sores. Get a flu shot  (influenza vaccine) each year. Keep all follow-up visits. Where to find more information Society for Vascular Surgery: vascular.org American Heart Association: heart.org National Heart, Lung, and Blood Institute: https://www.hartman-hill.biz/ Contact a doctor if: You have cramps in your legs when you walk. You have leg pain when you rest. Your leg or foot feels cold. Your skin changes. You cannot get or keep an erection. You have cuts or sores on your legs or feet that do not heal. Get help right away if: You have sudden changes in the color and feeling of your arms or legs, such as: Your arm or leg turns cold, numb, and blue. Your arm or leg becomes red, warm, swollen, painful, or numb. You have any signs of a stroke. "BE FAST" is an easy way to remember the main warning signs: B - Balance. Dizziness, sudden trouble walking, or loss of balance. E - Eyes. Trouble seeing or a change in how you see. F - Face. Sudden weakness or loss of feeling of the face. The face or eyelid may droop on one side. A - Arms. Weakness or loss of feeling in an arm. This happens all of a sudden and most often on one side of the body. S - Speech. Sudden trouble speaking, slurred speech, or trouble understanding what people say. T - Time. Time to call emergency services. Write down what time symptoms started. You have other signs of a stroke, such as: A sudden, very bad headache with no known cause. Feeling like you may vomit (nausea). Vomiting. A seizure. You have chest pain or trouble breathing. These symptoms may be an emergency. Get help right away. Call your local emergency services (911 in the [place] Do not wait to see if the symptoms will go away. Do not drive yourself to the hospital. Summary Peripheral vascular disease (PVD) is a disease of the blood vessels. PVD affects the legs and feet the most. Symptoms may include leg pain or leg numbness, tingling, and weakness. Treatment may include lifestyle changes,  medicines, and procedures. This information is not intended to replace advice given to you by your health care provider. Make sure you discuss any questions you have with your health care provider. Document Revised: 03/16/2020 Document Reviewed: 03/16/2020 Elsevier Patient Education  [place].

## 2022-06-30 ENCOUNTER — Telehealth: Payer: Self-pay

## 2022-06-30 LAB — CBC WITH DIFFERENTIAL/PLATELET
Basophils Absolute: 0 10*3/uL (ref 0.0–0.2)
Basos: 1 %
EOS (ABSOLUTE): 0.3 10*3/uL (ref 0.0–0.4)
Eos: 4 %
Hematocrit: 35.3 % (ref 34.0–46.6)
Hemoglobin: 11.6 g/dL (ref 11.1–15.9)
Immature Grans (Abs): 0 10*3/uL (ref 0.0–0.1)
Immature Granulocytes: 0 %
Lymphocytes Absolute: 2.1 10*3/uL (ref 0.7–3.1)
Lymphs: 30 %
MCH: 25.8 pg — ABNORMAL LOW (ref 26.6–33.0)
MCHC: 32.9 g/dL (ref 31.5–35.7)
MCV: 79 fL (ref 79–97)
Monocytes Absolute: 0.9 10*3/uL (ref 0.1–0.9)
Monocytes: 13 %
Neutrophils Absolute: 3.6 10*3/uL (ref 1.4–7.0)
Neutrophils: 52 %
Platelets: 466 10*3/uL — ABNORMAL HIGH (ref 150–450)
RBC: 4.49 x10E6/uL (ref 3.77–5.28)
RDW: 15.3 % (ref 11.7–15.4)
WBC: 7 10*3/uL (ref 3.4–10.8)

## 2022-06-30 LAB — CARDIOVASCULAR RISK ASSESSMENT

## 2022-06-30 LAB — COMPREHENSIVE METABOLIC PANEL
ALT: 19 IU/L (ref 0–32)
AST: 20 IU/L (ref 0–40)
Albumin/Globulin Ratio: 1.6 (ref 1.2–2.2)
Albumin: 4.5 g/dL (ref 3.8–4.8)
Alkaline Phosphatase: 69 IU/L (ref 44–121)
BUN/Creatinine Ratio: 15 (ref 12–28)
BUN: 16 mg/dL (ref 8–27)
Bilirubin Total: 0.2 mg/dL (ref 0.0–1.2)
CO2: 23 mmol/L (ref 20–29)
Calcium: 10 mg/dL (ref 8.7–10.3)
Chloride: 99 mmol/L (ref 96–106)
Creatinine, Ser: 1.08 mg/dL — ABNORMAL HIGH (ref 0.57–1.00)
Globulin, Total: 2.8 g/dL (ref 1.5–4.5)
Glucose: 104 mg/dL — ABNORMAL HIGH (ref 70–99)
Potassium: 5.6 mmol/L — ABNORMAL HIGH (ref 3.5–5.2)
Sodium: 134 mmol/L (ref 134–144)
Total Protein: 7.3 g/dL (ref 6.0–8.5)
eGFR: 54 mL/min/{1.73_m2} — ABNORMAL LOW (ref >59)

## 2022-06-30 LAB — TSH: TSH: 1.62 u[IU]/mL (ref 0.450–4.500)

## 2022-06-30 LAB — LIPID PANEL
Chol/HDL Ratio: 2.9 ratio (ref 0.0–4.4)
Cholesterol, Total: 140 mg/dL (ref 100–199)
HDL: 48 mg/dL (ref >39)
LDL Chol Calc (NIH): 61 mg/dL (ref 0–99)
Triglycerides: 188 mg/dL — ABNORMAL HIGH (ref 0–149)
VLDL Cholesterol Cal: 31 mg/dL (ref 5–40)

## 2022-06-30 LAB — VITAMIN D 25 HYDROXY (VIT D DEFICIENCY, FRACTURES): Vit D, 25-Hydroxy: 38 ng/mL (ref 30.0–100.0)

## 2022-06-30 LAB — T4, FREE: Free T4: 0.8 ng/dL — ABNORMAL LOW (ref 0.82–1.77)

## 2022-06-30 NOTE — Patient Outreach (Signed)
  Care Coordination   Initial Visit Note   06/30/2022 Name: Anna Hayes MRN: [id number] DOB: 18-Sep-1951  Anna Hayes is a 71 y.o. year old female who sees Montez Morita, Thornell Mule, NP for primary care. I spoke with  Ulyess Blossom by phone today.  What matters to the patients health and wellness today?  Placed call to patient to review [facility] care coordination program.  Patient reports she is doing well at this time and declines service   SDOH assessments and interventions completed:  No     Care Coordination Interventions Activated:  No  Care Coordination Interventions:  No, not indicated   Follow up plan:  No further intervention required.  Encounter Outcome:  Pt. Refused   Tomasa Rand, RN, BSN, [facility] [employer] [phone]

## 2022-07-04 ENCOUNTER — Telehealth: Payer: Self-pay

## 2022-07-04 ENCOUNTER — Other Ambulatory Visit: Payer: Self-pay

## 2022-07-04 ENCOUNTER — Other Ambulatory Visit: Payer: Self-pay | Admitting: Nurse Practitioner

## 2022-07-04 DIAGNOSIS — E038 Other specified hypothyroidism: Secondary | ICD-10-CM

## 2022-07-04 MED ORDER — OMEPRAZOLE 40 MG PO CPDR: 40.0000 mg | DELAYED_RELEASE_CAPSULE | Freq: Every day | ORAL | 1 refills | Status: DC

## 2022-07-04 MED ORDER — LEVOTHYROXINE SODIUM 50 MCG PO TABS: 50.0000 ug | ORAL_TABLET | Freq: Every day | ORAL | 3 refills | Status: DC

## 2022-07-04 NOTE — Chronic Care Management (AMB) (Signed)
Chronic Care Management Pharmacy Assistant   Name: Anna Hayes  MRN: [id number] DOB: 03/20/1951  Reason for Encounter: Disease State/ Hypertension  Recent office visits:  06-29-2022 Rip Harbour, NP. MCH= 25.8. Platelets= 466. Glucose= 104, Creatinine= 1.08, eGFR= 54, Potassium= 5.6. Trig= 188. Free T4= 0.80. Flu vaccine given.  Recent consult visits:  None  Hospital visits:  None in previous 6 months  Medications: Outpatient Encounter Medications as of 07/04/2022  Medication Sig   atenolol (TENORMIN) 50 MG tablet TAKE 1 TABLET BY MOUTH EVERY DAY   butalbital-acetaminophen-caffeine (FIORICET) 50-325-40 MG tablet Take 1 tablet by mouth every 6 (six) hours as needed for migraine.   cyanocobalamin (,VITAMIN B-12,) 1000 MCG/ML injection Inject into the muscle.   fluticasone (FLONASE) 50 MCG/ACT nasal spray SPRAY 2 SPRAYS INTO EACH NOSTRIL EVERY DAY   furosemide (LASIX) 20 MG tablet Take 20 mg by mouth daily as needed for fluid.    levothyroxine (SYNTHROID) 50 MCG tablet Take 1 tablet (50 mcg total) by mouth daily.   linaclotide (LINZESS) 145 MCG CAPS capsule TAKE 1 CAPSULE BY MOUTH EVERY DAY BEFORE BREAKFAST   lisinopril (ZESTRIL) 20 MG tablet Take 1 tablet (20 mg total) by mouth daily.   omega-3 acid ethyl esters (LOVAZA) 1 g capsule TAKE 2 CAPSULES BY MOUTH 2 TIMES DAILY.   omeprazole (PRILOSEC) 40 MG capsule TAKE 1 CAPSULE BY MOUTH EVERY DAY   ondansetron (ZOFRAN) 4 MG tablet Take 1 tablet (4 mg total) by mouth every 8 (eight) hours as needed for nausea or vomiting.   promethazine (PHENERGAN) 25 MG tablet Take 1 tablet (25 mg total) by mouth every 8 (eight) hours as needed.   rivaroxaban (XARELTO) 20 MG TABS tablet Take 1 tablet (20 mg total) by mouth daily with supper.   simvastatin (ZOCOR) 40 MG tablet TAKE 1 TABLET BY MOUTH EVERY DAY AT NIGHT   No facility-administered encounter medications on file as of 07/04/2022.   Recent Office Vitals: BP Readings from Last  3 Encounters:  06/29/22 (!) 144/80  03/23/22 132/66  12/14/21 134/70   Pulse Readings from Last 3 Encounters:  06/29/22 86  03/23/22 66  12/14/21 65    Wt Readings from Last 3 Encounters:  06/29/22 185 lb (83.9 kg)  12/14/21 192 lb (87.1 kg)  06/02/21 192 lb (87.1 kg)     Kidney Function Lab Results  Component Value Date/Time   CREATININE 1.08 (H) 06/29/2022 10:06 AM   CREATININE 0.85 03/23/2022 02:31 PM   CREATININE 0.47 (L) 08/31/2017 05:20 PM   GFRNONAA 60 08/19/2020 01:41 PM   GFRNONAA 101 08/31/2017 05:20 PM   GFRAA 70 08/19/2020 01:41 PM   GFRAA 118 08/31/2017 05:20 PM       Latest Ref Rng & Units 06/29/2022   10:06 AM 03/23/2022    2:31 PM 12/14/2021    3:36 PM  BMP  Glucose 70 - 99 mg/dL 104  92  100   BUN 8 - 27 mg/dL '16  16  15   ' Creatinine 0.57 - 1.00 mg/dL 1.08  0.85  1.04   BUN/Creat Ratio 12 - '28 15  19  14   ' Sodium 134 - 144 mmol/L 134  137  138   Potassium 3.5 - 5.2 mmol/L 5.6  5.1  5.7   Chloride 96 - 106 mmol/L 99  100  101   CO2 20 - 29 mmol/L '23  22  20   ' Calcium 8.7 - 10.3 mg/dL 10.0  9.8  9.9  Current antihypertensive regimen:  Lisinopril 20 mg daily Atenolol 50 mg daily Furosemide 20 mg PRN  Patient verbally confirms she is taking the above medications as directed. Yes  How often are you checking your Blood Pressure? 1-2x per week  she checks her blood pressure in the morning after taking her medication.  Current home BP readings: 139/60 09-31, 146/76 09-29, 144/74 09-28, 129/74 09-25. Wrist or arm cuff: Wrist and cuff Caffeine intake: 2 cans of soda weekly, and a cup of coffee once in a while Salt intake: Limited OTC medications including pseudoephedrine or NSAIDs? None  Any readings above 180/120? No  What recent interventions/DTPs have been made by any provider to improve Blood Pressure control since last CPP Visit:  Educated on BP goals and benefits of medications for prevention of heart attack, stroke and kidney damage;  Extensive time spent on counseling patient on blood pressure goal and impact of each antihypertensive medication on their blood pressure and risk reduction for CV disease.  Used analogies to explain the need for multiple antihypertensive medications to achieve BP goals and that it is often a silent disease with no symptoms.  -Counseled to monitor BP at home Weekly, document, and provide log at future appointments  Any recent hospitalizations or ED visits since last visit with CPP? No  What diet changes have been made to improve Blood Pressure Control?  Patient stated she limits salt intake and drinks plenty of water daily.  What exercise is being done to improve your Blood Pressure Control?  Patient stated she is active daily with cleaning. Patient states she plans on going back to the [facility] soon.   Adherence Review: Is the patient currently on ACE/ARB medication? Yes Does the patient have >5 day gap between last estimated fill dates? CPP to review  Notes: Patient requested refills for omeprazole. Sent request to pool.  Care Gaps: Last annual wellness visit? 07-23-2021 Tdap overdue Shingrix overdue Covid booster overdue  Star Rating Drugs: Lisinopril 20 mg- Last filled 05-19-2022 90 DS Simvastatin 40 mg- Last filled 06-29-2022 90 DS  [place] Clinical Pharmacist Assistant [phone]

## 2022-07-11 ENCOUNTER — Other Ambulatory Visit: Payer: Self-pay

## 2022-07-11 DIAGNOSIS — E038 Other specified hypothyroidism: Secondary | ICD-10-CM

## 2022-07-13 ENCOUNTER — Telehealth: Payer: Self-pay | Admitting: Nurse Practitioner

## 2022-07-13 ENCOUNTER — Other Ambulatory Visit: Payer: Self-pay | Admitting: Nurse Practitioner

## 2022-07-13 DIAGNOSIS — I10 Essential (primary) hypertension: Secondary | ICD-10-CM

## 2022-07-13 MED ORDER — NIFEDIPINE ER OSMOTIC RELEASE 30 MG PO TB24: 30.0000 mg | ORAL_TABLET | Freq: Every day | ORAL | 1 refills | Status: DC

## 2022-07-13 NOTE — Telephone Encounter (Signed)
She was slightly elevated at her last in-office visit. I will reach out to her. Thank you.

## 2022-07-13 NOTE — Telephone Encounter (Signed)
Telephoned pt concerning elevated BP readings. Procardia 30 mg by mouth daily prescribed. Pt to begin medication, monitor BP, keep log, return to office in 2 weeks for nurse visit for BP check, bring BP log. Pt verbalized understanding and agreed with all recommendations.

## 2022-07-16 ENCOUNTER — Other Ambulatory Visit: Payer: Self-pay | Admitting: Nurse Practitioner

## 2022-07-16 DIAGNOSIS — I10 Essential (primary) hypertension: Secondary | ICD-10-CM

## 2022-07-27 ENCOUNTER — Encounter: Payer: Self-pay | Admitting: Nurse Practitioner

## 2022-07-27 ENCOUNTER — Ambulatory Visit: Payer: Medicare HMO

## 2022-07-27 ENCOUNTER — Ambulatory Visit (INDEPENDENT_AMBULATORY_CARE_PROVIDER_SITE_OTHER): Payer: Medicare HMO | Admitting: Nurse Practitioner

## 2022-07-27 VITALS — BP 130/68 | HR 63 | Temp 97.6°F | Ht 62.5 in | Wt 182.0 lb

## 2022-07-27 DIAGNOSIS — G43011 Migraine without aura, intractable, with status migrainosus: Secondary | ICD-10-CM

## 2022-07-27 DIAGNOSIS — Z0000 Encounter for general adult medical examination without abnormal findings: Secondary | ICD-10-CM

## 2022-07-27 MED ORDER — UBRELVY 100 MG PO TABS: 100.0000 mg | ORAL_TABLET | Freq: Once | ORAL | 0 refills | Status: DC | PRN

## 2022-07-27 MED ORDER — PROMETHAZINE HCL 25 MG/ML IJ SOLN
25.0000 mg | Freq: Once | INTRAMUSCULAR | Status: AC
  Administered 2022-07-27: 25 mg via INTRAMUSCULAR

## 2022-07-27 MED ORDER — PROMETHAZINE HCL 25 MG PO TABS: 25.0000 mg | ORAL_TABLET | Freq: Once | ORAL | Status: DC

## 2022-07-27 NOTE — Progress Notes (Signed)
Subjective:   Anna Hayes is a 71 y.o. female who presents for Medicare Annual (Subsequent) preventive examination.  Review of Systems    Review of Systems  Constitutional:  Negative for chills, fever and malaise/fatigue.  HENT:  Negative for ear pain, sinus pain and sore throat.   Respiratory:  Negative for cough and shortness of breath.   Cardiovascular:  Negative for chest pain.  Musculoskeletal:  Negative for myalgias.  Neurological:  Positive for headaches.    Cardiac Risk Factors include: advanced age  >43 women);obesity BMI 32.76     Objective:   BP 130/68   Pulse 63   Temp 97.6 F (36.4 C)   Ht 5' 2.5" (1.588 m)   Wt 182 lb (82.6 kg)   SpO2 99%   BMI 32.76 kg/m   Today's Vitals   07/27/22 1052  Weight: 182 lb (82.6 kg)  Height: 5' 2.5" (1.588 m)   Body mass index is 32.76 kg/m.     07/27/2022   10:49 AM 07/23/2021   10:44 AM 06/12/2018    3:21 PM 04/10/2018    9:29 AM 01/24/2018   12:32 PM 01/24/2018   12:03 PM 07/12/2017    5:33 PM  Advanced Directives  Does Patient Have a Medical Advance Directive? No No No No No No No  Would patient like information on creating a medical advance directive? No - Patient declined Yes (MAU/Ambulatory/Procedural Areas - Information given) No - Patient declined No - Patient declined No - Patient declined No - Patient declined No - Patient declined    Current Medications (verified) Outpatient Encounter Medications as of 07/27/2022  Medication Sig   atenolol (TENORMIN) 50 MG tablet TAKE 1 TABLET BY MOUTH EVERY DAY   butalbital-acetaminophen-caffeine (FIORICET) 50-325-40 MG tablet Take 1 tablet by mouth every 6 (six) hours as needed for migraine.   cyanocobalamin (,VITAMIN B-12,) 1000 MCG/ML injection Inject into the muscle.   fluticasone (FLONASE) 50 MCG/ACT nasal spray SPRAY 2 SPRAYS INTO EACH NOSTRIL EVERY DAY   furosemide (LASIX) 20 MG tablet Take 20 mg by mouth daily as needed for fluid.    levothyroxine  (SYNTHROID) 50 MCG tablet Take 1 tablet (50 mcg total) by mouth daily.   linaclotide (LINZESS) 145 MCG CAPS capsule TAKE 1 CAPSULE BY MOUTH EVERY DAY BEFORE BREAKFAST   lisinopril (ZESTRIL) 20 MG tablet Take 1 tablet (20 mg total) by mouth daily.   NIFEdipine (PROCARDIA XL) 30 MG 24 hr tablet Take 1 tablet (30 mg total) by mouth daily.   omega-3 acid ethyl esters (LOVAZA) 1 g capsule TAKE 2 CAPSULES BY MOUTH 2 TIMES DAILY.   omeprazole (PRILOSEC) 40 MG capsule Take 1 capsule (40 mg total) by mouth daily.   ondansetron (ZOFRAN) 4 MG tablet Take 1 tablet (4 mg total) by mouth every 8 (eight) hours as needed for nausea or vomiting.   promethazine (PHENERGAN) 25 MG tablet Take 1 tablet (25 mg total) by mouth every 8 (eight) hours as needed.   rivaroxaban (XARELTO) 20 MG TABS tablet Take 1 tablet (20 mg total) by mouth daily with supper.   simvastatin (ZOCOR) 40 MG tablet TAKE 1 TABLET BY MOUTH EVERY DAY AT NIGHT   No facility-administered encounter medications on file as of 07/27/2022.    Allergies (verified) Latex, Codeine, Lyrica [pregabalin], Nurtec [rimegepant sulfate], Penicillins, Amlodipine, Aspirin, and Sulfa antibiotics   History: Past Medical History:  Diagnosis Date   Anemia    Constipation    DVT of lower extremity (deep  venous thrombosis) (HCC)    Frequent headaches    Heart murmur    found during pregnancy -  no problems   Hypertension    Muscle pain    Palpitations    Septic thrombophlebitis 08/31/2017   Sinus problem    Stroke [facility])    Past Surgical History:  Procedure Laterality Date   AMPUTATION Left 07/05/2017   Procedure: LEFT ABOVE KNEE AMPUTATION;  Surgeon: Conrad Washta, MD;  Location: [place];  Service: Vascular;  Laterality: Left;   CATARACT EXTRACTION W/PHACO Right 06/12/2018   Procedure: CATARACT EXTRACTION WITH INTRAOCULAR LENS IMPLANT;  Surgeon: Jalene Mullet, MD;  Location: [place];  Service: Ophthalmology;  Laterality: Right;   COLONOSCOPY  11/26/2015    Dr Lyda Jester - follow-up in 5 years   EMBOLECTOMY Bilateral 06/27/2017   Procedure: BILATERAL FEMORAL POPLITEAL EMBOLECTOMY;  Surgeon: Serafina Mitchell, MD;  Location[facility];  Service: Vascular;  Laterality: Bilateral;   FASCIOTOMY Left 06/27/2017   Procedure: FULL COMPARTMENT LEFT LOWER LEG FASCIOTOMY;  Surgeon: Serafina Mitchell, MD;  Location: [place];  Service: Vascular;  Laterality: Left;   PARTIAL HYSTERECTOMY     PATCH ANGIOPLASTY Left 06/27/2017   Procedure: PATCH ANGIOPLASTY Left Posterior Tibial Artery;  Surgeon: Serafina Mitchell, MD;  Location: [place];  Service: Vascular;  Laterality: Left;   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 07/11/2017   Procedure: REPAIR OF COMPLEX TRACTION RETINAL DETACHMENT WITH ENDO LASER AND ANTIFUNGAL INJECTION;  Surgeon: Jalene Mullet, MD;  Location: [place];  Service: Ophthalmology;  Laterality: Right;   TEE WITHOUT CARDIOVERSION N/A 07/11/2017   Procedure: TRANSESOPHAGEAL ECHOCARDIOGRAM (TEE);  Surgeon: Acie Fredrickson Wonda Cheng, MD;  [facility];  Service: Cardiovascular;  Laterality: N/A;   Family History  Problem Relation Age of Onset   Breast cancer Mother    Prostate cancer Father    Prostate cancer Brother    Social History   Socioeconomic History   Marital status: Widowed    Spouse name: Not on file   Number of children: Not on file   Years of education: Not on file   Highest education level: Not on file  Occupational History   Not on file  Tobacco Use   Smoking status: Some Days    Packs/day: 0.25    Years: 53.00    Total pack years: 13.25    Types: Cigarettes   Smokeless tobacco: Never  Vaping Use   Vaping Use: Never used  Substance and Sexual Activity   Alcohol use: No   Drug use: No   Sexual activity: Not Currently  Other Topics Concern   Not on file  Social History Narrative   Lives with her daughter   Social Determinants of Health   Financial Resource Strain: Low Risk  (04/13/2022)   Overall  Financial Resource Strain (CARDIA)    Difficulty of Paying Living Expenses: Not hard at all  Food Insecurity: No Food Insecurity (07/23/2021)   Hunger Vital Sign    Worried About Running Out of Food in the Last Year: Never true    [place] in the Last Year: Never true  Transportation Needs: No Transportation Needs (04/13/2022)   PRAPARE - Hydrologist (Medical): No    Lack of Transportation (Non-Medical): No  Physical Activity: Insufficiently Active (06/29/2022)   Exercise Vital Sign    Days of Exercise per Week: 1 day    Minutes of Exercise per Session: 60 min  Stress:  No Stress Concern Present (06/29/2022)   [place]    Feeling of Stress : Not at all  Social Connections: Moderately Isolated (06/29/2022)   Social Connection and Isolation Panel [NHANES]    Frequency of Communication with Friends and Family: More than three times a week    Frequency of Social Gatherings with Friends and Family: More than three times a week    Attends Religious Services: 1 to 4 times per year    Active Member of [employer] or Organizations: No    Attends Archivist Meetings: Never    Marital Status: Widowed    Tobacco Counseling Ready to quit: Not Answered Counseling given: Not Answered       Pain : No/denies pain     BMI - recorded: 32.76 Nutritional Status: BMI > 30  Obese Nutritional Risks: None Diabetes: No  How often do you need to have someone help you when you read instructions, pamphlets, or other written materials from your doctor or pharmacy?: 1 - Never  Diabetic?no  Interpreter Needed?: No      Activities of Daily Living    07/27/2022   10:50 AM 06/29/2022    9:30 AM  In your present state of health, do you have any difficulty performing the following activities:  Hearing? 0 0  Vision? 0 0  Difficulty concentrating or making decisions? 0 0  Walking or climbing  stairs? 1 1  Comment  uses a wheelchair  Dressing or bathing? 0 0  Doing errands, shopping? 1 1  Preparing Food and eating ? N   Using the Toilet? N   In the past six months, have you accidently leaked urine? N   Do you have problems with loss of bowel control? N   Managing your Medications? N   Managing your Finances? N   Housekeeping or managing your Housekeeping? N     Patient Care Team: Rip Harbour, NP as PCP - General (Nurse Practitioner) Lane Hacker, [facility] (Pharmacist)  Indicate any recent Medical Services you may have received from other than [facility] providers in the past year (date may be approximate).     Assessment:   This is a routine wellness examination for [place].  Hearing/Vision screen Eye exam 2019  Dietary issues and exercise activities discussed: Current Exercise Habits: Home exercise routine, Type of exercise: strength training/weights, Time (Minutes): 30, Frequency (Times/Week): 1, Weekly Exercise (Minutes/Week): 30, Intensity: Moderate, Exercise limited by: Other - see comments (Previous stroke, paralyzed on left side.)   Goals Addressed   Decrease smoking, possibly quit Weight loss   Depression Screen    07/27/2022   10:49 AM 06/29/2022    9:30 AM 07/23/2021   10:41 AM 11/24/2020   11:14 AM 07/21/2020    3:04 PM 12/01/2019   11:11 PM 10/02/2017   10:06 AM  PHQ 2/9 Scores  PHQ - 2 Score 0 0 0 0 0 0 0  PHQ- 9 Score 0 0         Fall Risk    07/27/2022   10:49 AM 06/29/2022    9:29 AM 07/23/2021   10:44 AM 11/24/2020   11:14 AM 07/21/2020    3:03 PM  Fall Risk   Falls in the past year? 0 0 0 1 0  Number falls in past yr: 0 0 0 0 0  Injury with Fall? 0 0 0 0 0  Risk for fall due to : Impaired balance/gait Impaired balance/gait;Other (  Comment) Impaired mobility  Impaired mobility;No Fall Risks  Risk for fall due to: Comment Wheelchair bound wheelchair     Follow up Falls evaluation completed;Falls prevention discussed Falls evaluation completed  Falls evaluation completed;Falls prevention discussed;Education provided  Falls evaluation completed;Falls prevention discussed    FALL RISK PREVENTION PERTAINING TO THE HOME:  Any stairs in or around the home? No  If so, are there any without handrails? Yes  Home free of loose throw rugs in walkways, pet beds, electrical cords, etc? Yes  Adequate lighting in your home to reduce risk of falls? Yes   ASSISTIVE DEVICES UTILIZED TO PREVENT FALLS:  Life alert? No  Use of a cane, walker or w/c? Yes  Grab bars in the bathroom? Yes  Shower chair or bench in shower? Yes  Elevated toilet seat or a handicapped toilet? No   TIMED UP AND GO:  Was the test performed? No .  Length of time to ambulate 10 feet: not performed, pt had left AKA, right hemiparesis   Cognitive Function:    07/27/2022   10:54 AM  MMSE - Mini Mental State Exam  Orientation to time 5  Orientation to Place 5  Registration 3  Attention/ Calculation 5  Recall 3  Language- name 2 objects 2  Language- repeat 1  Language- follow 3 step command 3  Language- read & follow direction 1  Write a sentence 1  Copy design 0  Total score 29        07/23/2021   10:46 AM  6CIT Screen  What Year? 0 points  What month? 0 points  What time? 0 points  Count back from 20 0 points  Months in reverse 2 points  Repeat phrase 0 points  Total Score 2 points    Immunizations Immunization History  Administered Date(s) Administered   Fluad Quad(high Dose 65+) 06/18/2020, 06/02/2021, 06/29/2022   Influenza, High Dose Seasonal PF 07/02/2017   Influenza-Unspecified 07/27/2018   PFIZER(Purple Top)SARS-COV-2 Vaccination 12/09/2019, 01/06/2020, 08/27/2020   Pfizer Covid-19 Vaccine Bivalent Booster 84yr & up 09/08/2021   Pneumococcal Conjugate-13 09/14/2015   Pneumococcal Polysaccharide-23 07/31/2012, 08/06/2018    TDAP status: Due, Education has been provided regarding the importance of this vaccine. Advised may receive this  vaccine at local pharmacy or Health Dept. Aware to provide a copy of the vaccination record if obtained from local pharmacy or Health Dept. Verbalized acceptance and understanding.  Flu Vaccine status: Up to date  Pneumococcal vaccine status: Up to date  Covid-19 vaccine status: Information provided on how to obtain vaccines.   Qualifies for Shingles Vaccine? Yes   Zostavax completed No   Shingrix Completed?: No.    Education has been provided regarding the importance of this vaccine. Patient has been advised to call insurance company to determine out of pocket expense if they have not yet received this vaccine. Advised may also receive vaccine at local pharmacy or Health Dept. Verbalized acceptance and understanding.  Screening Tests Health Maintenance  Topic Date Due   TETANUS/TDAP  Never done   Zoster Vaccines- Shingrix (1 of 2) Never done   COVID-19 Vaccine (5 - Pfizer series) 01/07/2022   COLONOSCOPY (Pts 45-432yrInsurance coverage will need to be confirmed)  12/08/2022   MAMMOGRAM  02/02/2024   Pneumonia Vaccine 6531Years old  Completed   INFLUENZA VACCINE  Completed   DEXA SCAN  Completed   Hepatitis C Screening  Completed   [facility] [place]  [place]aintenance Due  Topic Date Due   TETANUS/TDAP  Never done   Zoster Vaccines- Shingrix (1 of 2) Never done   COVID-19 Vaccine (5 - Pfizer series) 01/07/2022    Colorectal cancer screening: Type of screening: Colonoscopy. Completed 12/07/2021. Repeat every 1 years  Mammogram status: Completed 1. Repeat every year  Bone Density status: Completed 2. Results reflect: Bone density results: OSTEOPENIA. Repeat every 2 years.  Lung Cancer Screening: (Low Dose CT Chest recommended if Age 75-80 years, 30 pack-year currently smoking OR have quit w/in 15years.) does qualify.   Lung Cancer Screening Referral: no, patient declined  Additional Screening:  Hepatitis C Screening: does not qualify;  Completed   Vision Screening: Recommended annual ophthalmology exams for early detection of glaucoma and other disorders of the eye. Is the patient up to date with their annual eye exam?  No  Who is the provider or what is the name of the office in which the patient attends annual eye exams? Dr. Mancel Bale If pt is not established with a provider, would they like to be referred to a provider to establish care? No .   Dental Screening: Recommended annual dental exams for proper oral hygiene  Community Resource Referral / Chronic Care Management: CRR required this visit?  No   CCM required this visit?  No      Plan:    1. Encounter for Medicare annual wellness exam  2. Intractable migraine without aura and with status migrainosus - Ubrogepant (UBRELVY) 100 MG TABS; Take 100 mg by mouth once as needed for up to 1 dose (migraine headache).  Dispense: 2 tablet; Refill: 0 - promethazine (PHENERGAN) injection 25 mg    I have personally reviewed and noted the following in the patient's chart:   Medical and social history Use of alcohol, tobacco or illicit drugs  Current medications and supplements including opioid prescriptions. Patient is currently taking opioid prescriptions. Information provided to patient regarding non-opioid alternatives. Patient advised to discuss non-opioid treatment plan with their provider. Functional ability and status Nutritional status Physical activity Advanced directives List of other physicians Hospitalizations, surgeries, and ER visits in previous 12 months Vitals Screenings to include cognitive, depression, and falls Referrals and appointments  In addition, I have reviewed and discussed with patient certain preventive protocols, quality metrics, and best practice recommendations. A written personalized care plan for preventive services as well as general preventive health recommendations were provided to patient.    I, Rip Harbour, NP, have  reviewed all documentation for this visit. The documentation on 07/27/22 for the exam, diagnosis, procedures, and orders are all accurate and complete.   Signed, Jerrell Belfast, DNP

## 2022-07-27 NOTE — Patient Instructions (Signed)
Continue medications  Follow-up Jan 5th, 2024 at 10:00    [facility] Your bones do more than support your body. They also store calcium. The inside of your bones (marrow) makes blood cells. Maintaining bone health becomes more important as you age because your bones replace all their cells about every 10 years. Around age 73, it gets harder to replace those cells, and bones can become weak. Weak bones can lead to osteoporosis and breaks (fractures). Falls also become more likely, which can cause fractures. The good news is that, with diet and exercise, you can improve and maintain your bone health at any age. How to eat for bone health A balanced diet can supply many of the vitamins, minerals, and proteins you need for bone health. Older adults need to make sure they get enough calcium, vitamin D, and magnesium. You may need more of these as you age. Calcium  Calcium is the most important (essential) mineral for bone health. The daily requirement for calcium for adult men aged 60-70 years is 1,000 mg. For adult women aged 41-70 years, it is 1,200 mg. At age 69 or older, it is 1,200 mg for both men and women. Sources of calcium in your diet include: Dairy foods like milk, yogurt, and cheese. Dairy foods are the best sources. If you cannot eat dairy, you may need a calcium supplement. Leafy, dark green vegetables. These include collard greens, kale, broccoli, bok choy, and okra. Fatty fish, like sardines and canned salmon with bones. Almonds. Tofu.  Vitamin D You need vitamin D for bone health because it helps your body absorb calcium from your diet. It is not found naturally in many foods. Many people can benefit from taking a supplement. The daily requirement for vitamin D at age 35 or older is 600-1,000 international units (IU). Dietary sources for vitamin D include: Fatty fish, such as swordfish, salmon, sardine, and mackerel. Foods that have vitamin D added to  them (are fortified), like cereal and dairy products. Egg yolks. Magnesium Magnesium helps your body use both calcium and vitamin D. The recommended daily intake for adult men is 400-420 mg. For adult women, it is 310-320 mg. Dietary sources of magnesium include: Green vegetables, such as collard greens, kale, bok choy, and okra. Poppy, sesame, and chia seeds. Legumes, including peas and beans. Whole grains. Avocados. Nuts. If you drink alcohol regularly or take a type of antacid called a proton pump inhibitor, you might benefit from a magnesium supplement. How to exercise for bone health Exercise is important for bone health because it strengthens bones and muscles. Bones are living organs that get stronger when you exercise them, just like your heart and other muscles. Muscles support your bones and protect your joints. Strong muscles also help prevent bone loss and falls. Weight-bearing exercises and resistance exercises are the two types of exercise that are most important for bone health. Weight-bearing exercises include running or walking, climbing stairs, and playing sports like tennis. Resistance exercises include those done using free weights, weight machines, or resistance bands. Try to get at least 30 minutes of exercise every day. You can also include stretching, balance, and flexibility exercises, like yoga or tai chi. These lower your risk of falls. Follow these instructions at home Ask your health care provider if: You need a bone density test. This is especially important for women older than 23 and men older than 70. You have been losing height. Loss of height may be a  sign of weakening bones in your spine. Any of your medicines or medical conditions could affect your bone health. He or she could recommend an exercise program that is safe for you. Be sure it includes both weight-bearing and resistance exercises. Do not use any products that contain nicotine or tobacco. These  products include cigarettes, chewing tobacco, and vaping devices, such as e-cigarettes. These can reduce bone density. If you need help quitting, ask your health care provider. Drink alcohol in moderation. Alcohol reduces bone density and increases your risk for falls. If you drink alcohol: Limit how much you have to: 0-1 drink a day for women who are not pregnant. 0-2 drinks a day for men. Know how much alcohol is in a drink. In the [place] one drink equals one 12 oz bottle of beer (355 mL), one 5 oz glass of wine (148 mL), or one 1 oz glass of hard liquor (44 mL). Take over-the-counter and prescription medicines only as told by your health care provider. Ask your health care provider if you could benefit from taking calcium, vitamin D, or magnesium supplements. Keep all follow-up visits. This is important. Where to find more information American Bone Health: CardKnowledge.fi American Academy of Orthopaedic Surgeons: orthoinfo.[place]: bones.SouthEx[place].es Summary Maintaining your bone health becomes more important as you age. You can improve and maintain your bone health with diet and exercise at any age. A balanced diet can supply many of the vitamins, minerals, and proteins you need for bone health. Older adults need to make sure they get enough calcium, vitamin D, and magnesium. Ask your health care provider if you could benefit from taking calcium, vitamin D, or magnesium supplements. Exercise is important for bone health because it strengthens bones and muscles. Do both weight-bearing and resistance exercises. This information is not intended to replace advice given to you by your health care provider. Make sure you discuss any questions you have with your health care provider. Document Revised: 02/24/2021 Document Reviewed: 02/24/2021 Elsevier Patient Education  [place].

## 2022-08-01 ENCOUNTER — Other Ambulatory Visit: Payer: Self-pay

## 2022-08-01 DIAGNOSIS — G43009 Migraine without aura, not intractable, without status migrainosus: Secondary | ICD-10-CM

## 2022-08-01 MED ORDER — LINACLOTIDE 145 MCG PO CAPS: ORAL_CAPSULE | ORAL | 1 refills | Status: DC

## 2022-08-01 MED ORDER — BUTALBITAL-APAP-CAFFEINE 50-325-40 MG PO TABS: 1.0000 | ORAL_TABLET | Freq: Four times a day (QID) | ORAL | 2 refills | Status: DC | PRN

## 2022-08-01 MED ORDER — RIVAROXABAN 20 MG PO TABS: 20.0000 mg | ORAL_TABLET | Freq: Every day | ORAL | 1 refills | Status: DC

## 2022-08-04 ENCOUNTER — Telehealth: Payer: Self-pay

## 2022-08-04 NOTE — Chronic Care Management (AMB) (Unsigned)
Chronic Care Management Pharmacy Assistant   Name: Anna Hayes  MRN: [id number] DOB: 02-03-1951  Reason for Encounter: Disease State/ Hypertension  Recent office visits:  07-27-2022 Rip Harbour, NP. Medicare wellness visit. Ubrelvy 100 mg samples given. Phenergan injection given.  Recent consult visits:  None  Hospital visits:  None in previous 6 months  Medications: Outpatient Encounter Medications as of 08/04/2022  Medication Sig   atenolol (TENORMIN) 50 MG tablet TAKE 1 TABLET BY MOUTH EVERY DAY   butalbital-acetaminophen-caffeine (FIORICET) 50-325-40 MG tablet Take 1 tablet by mouth every 6 (six) hours as needed for migraine.   cyanocobalamin (,VITAMIN B-12,) 1000 MCG/ML injection Inject into the muscle.   fluticasone (FLONASE) 50 MCG/ACT nasal spray SPRAY 2 SPRAYS INTO EACH NOSTRIL EVERY DAY   furosemide (LASIX) 20 MG tablet Take 20 mg by mouth daily as needed for fluid.    levothyroxine (SYNTHROID) 50 MCG tablet Take 1 tablet (50 mcg total) by mouth daily.   linaclotide (LINZESS) 145 MCG CAPS capsule TAKE 1 CAPSULE BY MOUTH EVERY DAY BEFORE BREAKFAST   lisinopril (ZESTRIL) 20 MG tablet Take 1 tablet (20 mg total) by mouth daily.   NIFEdipine (PROCARDIA XL) 30 MG 24 hr tablet Take 1 tablet (30 mg total) by mouth daily.   omega-3 acid ethyl esters (LOVAZA) 1 g capsule TAKE 2 CAPSULES BY MOUTH 2 TIMES DAILY.   omeprazole (PRILOSEC) 40 MG capsule Take 1 capsule (40 mg total) by mouth daily.   ondansetron (ZOFRAN) 4 MG tablet Take 1 tablet (4 mg total) by mouth every 8 (eight) hours as needed for nausea or vomiting.   promethazine (PHENERGAN) 25 MG tablet Take 1 tablet (25 mg total) by mouth every 8 (eight) hours as needed.   rivaroxaban (XARELTO) 20 MG TABS tablet Take 1 tablet (20 mg total) by mouth daily with supper.   simvastatin (ZOCOR) 40 MG tablet TAKE 1 TABLET BY MOUTH EVERY DAY AT NIGHT   Ubrogepant (UBRELVY) 100 MG TABS Take 100 mg by mouth once as  needed for up to 1 dose (migraine headache).   No facility-administered encounter medications on file as of 08/04/2022.   Recent Office Vitals: BP Readings from Last 3 Encounters:  07/27/22 130/68  06/29/22 (!) 144/80  03/23/22 132/66   Pulse Readings from Last 3 Encounters:  07/27/22 63  06/29/22 86  03/23/22 66    Wt Readings from Last 3 Encounters:  07/27/22 182 lb (82.6 kg)  06/29/22 185 lb (83.9 kg)  12/14/21 192 lb (87.1 kg)     Kidney Function Lab Results  Component Value Date/Time   CREATININE 1.08 (H) 06/29/2022 10:06 AM   CREATININE 0.85 03/23/2022 02:31 PM   CREATININE 0.47 (L) 08/31/2017 05:20 PM   GFRNONAA 60 08/19/2020 01:41 PM   GFRNONAA 101 08/31/2017 05:20 PM   GFRAA 70 08/19/2020 01:41 PM   GFRAA 118 08/31/2017 05:20 PM       Latest Ref Rng & Units 06/29/2022   10:06 AM 03/23/2022    2:31 PM 12/14/2021    3:36 PM  BMP  Glucose 70 - 99 mg/dL 104  92  100   BUN 8 - 27 mg/dL '16  16  15   '$ Creatinine 0.57 - 1.00 mg/dL 1.08  0.85  1.04   BUN/Creat Ratio 12 - '28 15  19  14   '$ Sodium 134 - 144 mmol/L 134  137  138   Potassium 3.5 - 5.2 mmol/L 5.6  5.1  5.7  Chloride 96 - 106 mmol/L 99  100  101   CO2 20 - 29 mmol/L '23  22  20   '$ Calcium 8.7 - 10.3 mg/dL 10.0  9.8  9.9      Current antihypertensive regimen:  Lisinopril 20 mg daily Atenolol 50 mg daily Furosemide 20 mg PRN  Patient verbally confirms she is taking the above medications as directed. {yes/no:20286}  How often are you checking your Blood Pressure? {CHL HP BP Monitoring [phone]}  she checks her blood pressure {timing:25218} {before/after:25217} taking her medication.  Current home BP readings: *** Wrist or arm cuff: Caffeine intake: Salt intake: OTC medications including pseudoephedrine or NSAIDs?  Any readings above 180/120? {yes/no:20286} If yes any symptoms of hypertensive emergency? {hypertensive emergency symptoms:25354}   What recent interventions/DTPs have been  made by any provider to improve Blood Pressure control since last CPP Visit:  Educated on BP goals and benefits of medications for prevention of heart attack, stroke and kidney damage; Extensive time spent on counseling patient on blood pressure goal and impact of each antihypertensive medication on their blood pressure and risk reduction for CV disease.  Used analogies to explain the need for multiple antihypertensive medications to achieve BP goals and that it is often a silent disease with no symptoms.  -Counseled to monitor BP at home Weekly, document, and provide log at future appointments  Any recent hospitalizations or ED visits since last visit with CPP? No  What diet changes have been made to improve Blood Pressure Control?  ***  What exercise is being done to improve your Blood Pressure Control?  ***  08-04-2022: 1st attempt left VM 08-05-2022: 2nd attempt left VM  Adherence Review: Is the patient currently on ACE/ARB medication? Yes Does the patient have >5 day gap between last estimated fill dates? CPP to review  Care Gaps: Last annual wellness visit? 07-27-2022 Tdap overdue Shingrix overdue Covid booster overdue  Star Rating Drugs: Lisinopril 20 mg- Last filled 05-19-2022 90 DS Simvastatin 40 mg- Last filled 06-29-2022 90 DS  [place] Clinical Pharmacist Assistant [phone]

## 2022-08-22 ENCOUNTER — Other Ambulatory Visit: Payer: Medicare HMO

## 2022-09-06 ENCOUNTER — Telehealth: Payer: Self-pay

## 2022-09-06 NOTE — Chronic Care Management (AMB) (Signed)
    Chronic Care Management Pharmacy Assistant   Name: Anna Hayes  MRN: [id number] DOB: [birth year]  Reason for Encounter: Disease State/ General  Recent office visits:  07-27-2022 Rip Harbour, NP. START Roselyn Meier 100 mg PRN. Promethazine injection given.   Recent consult visits:  None  Hospital visits:  None in previous 6 months  Medications: Outpatient Encounter Medications as of 09/06/2022  Medication Sig   atenolol (TENORMIN) 50 MG tablet TAKE 1 TABLET BY MOUTH EVERY DAY   butalbital-acetaminophen-caffeine (FIORICET) 50-325-40 MG tablet Take 1 tablet by mouth every 6 (six) hours as needed for migraine.   cyanocobalamin (,VITAMIN B-12,) 1000 MCG/ML injection Inject into the muscle.   fluticasone (FLONASE) 50 MCG/ACT nasal spray SPRAY 2 SPRAYS INTO EACH NOSTRIL EVERY DAY   furosemide (LASIX) 20 MG tablet Take 20 mg by mouth daily as needed for fluid.    levothyroxine (SYNTHROID) 50 MCG tablet Take 1 tablet (50 mcg total) by mouth daily.   linaclotide (LINZESS) 145 MCG CAPS capsule TAKE 1 CAPSULE BY MOUTH EVERY DAY BEFORE BREAKFAST   lisinopril (ZESTRIL) 20 MG tablet Take 1 tablet (20 mg total) by mouth daily.   NIFEdipine (PROCARDIA XL) 30 MG 24 hr tablet Take 1 tablet (30 mg total) by mouth daily.   omega-3 acid ethyl esters (LOVAZA) 1 g capsule TAKE 2 CAPSULES BY MOUTH 2 TIMES DAILY.   omeprazole (PRILOSEC) 40 MG capsule Take 1 capsule (40 mg total) by mouth daily.   ondansetron (ZOFRAN) 4 MG tablet Take 1 tablet (4 mg total) by mouth every 8 (eight) hours as needed for nausea or vomiting.   promethazine (PHENERGAN) 25 MG tablet Take 1 tablet (25 mg total) by mouth every 8 (eight) hours as needed.   rivaroxaban (XARELTO) 20 MG TABS tablet Take 1 tablet (20 mg total) by mouth daily with supper.   simvastatin (ZOCOR) 40 MG tablet TAKE 1 TABLET BY MOUTH EVERY DAY AT NIGHT   Ubrogepant (UBRELVY) 100 MG TABS Take 100 mg by mouth once as needed for up to 1 dose (migraine  headache).   No facility-administered encounter medications on file as of 09/06/2022.    09-06-2022: 1st attempt left VM 09-08-2022: 2nd attempt left VM 09-09-2022: 3rd attempt left VM  Care Gaps: Tdap overdue Shingrix overdue Covid booster overdue  Star Rating Drugs: Lisinopril 20 mg- Last filled 07-02-2022 90 DS. Previous 05-19-2022 90 DS Simvastatin 40 mg- Last filled 06-29-2022 90 DS. Previous 06-14-2022 90 DS  [place] Clinical Pharmacist Assistant [phone]

## 2022-09-29 ENCOUNTER — Other Ambulatory Visit: Payer: Self-pay | Admitting: Nurse Practitioner

## 2022-09-29 DIAGNOSIS — I10 Essential (primary) hypertension: Secondary | ICD-10-CM

## 2022-09-30 ENCOUNTER — Ambulatory Visit: Payer: Medicare HMO | Admitting: Nurse Practitioner

## 2022-10-05 ENCOUNTER — Telehealth: Payer: Self-pay

## 2022-10-05 NOTE — Progress Notes (Signed)
Care Management & Coordination Services Pharmacy Team  Reason for Encounter: Hypertension  Contacted patient on 01- to discuss hypertension disease state.   Recent office visits:  07-27-2022 Rip Harbour, NP. Medicare annual wellness visit.  06-29-2022 Rip Harbour, NP. MCH= 25.8, Platelets= 466. Glucose= 104, Creatinine= 1.08, eGFR= 54, Potassium= 5.6. Trig= 188. Free T4= 0.80. Flu vaccine given.  Recent consult visits:  None  Hospital visits:  None in previous 6 months  Medications: Outpatient Encounter Medications as of 10/05/2022  Medication Sig   atenolol (TENORMIN) 50 MG tablet TAKE 1 TABLET BY MOUTH EVERY DAY   butalbital-acetaminophen-caffeine (FIORICET) 50-325-40 MG tablet Take 1 tablet by mouth every 6 (six) hours as needed for migraine.   cyanocobalamin (,VITAMIN B-12,) 1000 MCG/ML injection Inject into the muscle.   fluticasone (FLONASE) 50 MCG/ACT nasal spray SPRAY 2 SPRAYS INTO EACH NOSTRIL EVERY DAY   furosemide (LASIX) 20 MG tablet Take 20 mg by mouth daily as needed for fluid.    levothyroxine (SYNTHROID) 50 MCG tablet Take 1 tablet (50 mcg total) by mouth daily.   linaclotide (LINZESS) 145 MCG CAPS capsule TAKE 1 CAPSULE BY MOUTH EVERY DAY BEFORE BREAKFAST   lisinopril (ZESTRIL) 20 MG tablet TAKE 1 TABLET BY MOUTH EVERY DAY   NIFEdipine (PROCARDIA XL) 30 MG 24 hr tablet Take 1 tablet (30 mg total) by mouth daily.   omega-3 acid ethyl esters (LOVAZA) 1 g capsule TAKE 2 CAPSULES BY MOUTH 2 TIMES DAILY.   omeprazole (PRILOSEC) 40 MG capsule Take 1 capsule (40 mg total) by mouth daily.   ondansetron (ZOFRAN) 4 MG tablet Take 1 tablet (4 mg total) by mouth every 8 (eight) hours as needed for nausea or vomiting.   promethazine (PHENERGAN) 25 MG tablet Take 1 tablet (25 mg total) by mouth every 8 (eight) hours as needed.   rivaroxaban (XARELTO) 20 MG TABS tablet Take 1 tablet (20 mg total) by mouth daily with supper.   simvastatin (ZOCOR) 40 MG tablet TAKE 1  TABLET BY MOUTH EVERY DAY AT NIGHT   Ubrogepant (UBRELVY) 100 MG TABS Take 100 mg by mouth once as needed for up to 1 dose (migraine headache).   No facility-administered encounter medications on file as of 10/05/2022.    Recent Office Vitals: BP Readings from Last 3 Encounters:  07/27/22 130/68  06/29/22 (!) 144/80  03/23/22 132/66   Pulse Readings from Last 3 Encounters:  07/27/22 63  06/29/22 86  03/23/22 66    Wt Readings from Last 3 Encounters:  07/27/22 182 lb (82.6 kg)  06/29/22 185 lb (83.9 kg)  12/14/21 192 lb (87.1 kg)     Kidney Function Lab Results  Component Value Date/Time   CREATININE 1.08 (H) 06/29/2022 10:06 AM   CREATININE 0.85 03/23/2022 02:31 PM   CREATININE 0.47 (L) 08/31/2017 05:20 PM   GFRNONAA 60 08/19/2020 01:41 PM   GFRNONAA 101 08/31/2017 05:20 PM   GFRAA 70 08/19/2020 01:41 PM   GFRAA 118 08/31/2017 05:20 PM       Latest Ref Rng & Units 06/29/2022   10:06 AM 03/23/2022    2:31 PM 12/14/2021    3:36 PM  BMP  Glucose 70 - 99 mg/dL 104  92  100   BUN 8 - 27 mg/dL '16  16  15   '$ Creatinine 0.57 - 1.00 mg/dL 1.08  0.85  1.04   BUN/Creat Ratio 12 - '28 15  19  14   '$ Sodium 134 - 144 mmol/L 134  137  138   Potassium 3.5 - 5.2 mmol/L 5.6  5.1  5.7   Chloride 96 - 106 mmol/L 99  100  101   CO2 20 - 29 mmol/L '23  22  20   '$ Calcium 8.7 - 10.3 mg/dL 10.0  9.8  9.9      Current antihypertensive regimen:  Lisinopril 20 mg daily Atenolol 50 mg daily  Patient verbally confirms she is taking the above medications as directed. Yes  How often are you checking your Blood Pressure? 1-2x per week  she checks her blood pressure in the morning before taking her medication.  Current home BP readings: 01-02 132/78, 01-05 130/70, 12-27 134/68, 12-30 132/72, 12-19 136/72 Wrist or arm cuff: arm Caffeine intake: Patient stated limited. May drink a sip of soda once in a while Salt intake: Limited OTC medications including pseudoephedrine or NSAIDs?  Any  readings above 180/120? No  What recent interventions/DTPs have been made by any provider to improve Blood Pressure control since last CPP Visit:  Educated on BP goals and benefits of medications for prevention of heart attack, stroke and kidney damage; Extensive time spent on counseling patient on blood pressure goal and impact of each antihypertensive medication on their blood pressure and risk reduction for CV disease.  Used analogies to explain the need for multiple antihypertensive medications to achieve BP goals and that it is often a silent disease with no symptoms.  -Counseled to monitor BP at home Weekly, document, and provide log at future appointments  Any recent hospitalizations or ED visits since last visit with CPP? No  What diet changes have been made to improve Blood Pressure Control?  Patient limits salt intake   What exercise is being done to improve your Blood Pressure Control?  Patient states she walks some days and cleans around the house  Adherence Review: Is the patient currently on ACE/ARB medication? No Does the patient have >5 day gap between last estimated fill dates? No  Star Rating Drugs:  Lisinopril 20 mg- Last filled 08-27-2022 90 DS. Previous 05-19-2022 90 DS Simvastatin 40 mg- Last filled 09-23-2022 90 DS. Previous 06-29-2022 90 DS   Malecca Ishmael Holter

## 2022-10-11 ENCOUNTER — Other Ambulatory Visit: Payer: Self-pay | Admitting: Nurse Practitioner

## 2022-10-11 DIAGNOSIS — E782 Mixed hyperlipidemia: Secondary | ICD-10-CM

## 2022-10-13 ENCOUNTER — Encounter: Payer: Self-pay | Admitting: Nurse Practitioner

## 2022-10-13 ENCOUNTER — Other Ambulatory Visit: Payer: Self-pay | Admitting: Nurse Practitioner

## 2022-10-13 ENCOUNTER — Ambulatory Visit (INDEPENDENT_AMBULATORY_CARE_PROVIDER_SITE_OTHER): Payer: Medicare HMO | Admitting: Nurse Practitioner

## 2022-10-13 VITALS — BP 160/82 | HR 69 | Temp 97.6°F | Ht 62.5 in | Wt 185.0 lb

## 2022-10-13 DIAGNOSIS — Z7409 Other reduced mobility: Secondary | ICD-10-CM | POA: Diagnosis not present

## 2022-10-13 DIAGNOSIS — I739 Peripheral vascular disease, unspecified: Secondary | ICD-10-CM | POA: Diagnosis not present

## 2022-10-13 DIAGNOSIS — E6609 Other obesity due to excess calories: Secondary | ICD-10-CM | POA: Diagnosis not present

## 2022-10-13 DIAGNOSIS — G8194 Hemiplegia, unspecified affecting left nondominant side: Secondary | ICD-10-CM | POA: Diagnosis not present

## 2022-10-13 DIAGNOSIS — S78112A Complete traumatic amputation at level between left hip and knee, initial encounter: Secondary | ICD-10-CM

## 2022-10-13 DIAGNOSIS — R69 Illness, unspecified: Secondary | ICD-10-CM | POA: Diagnosis not present

## 2022-10-13 DIAGNOSIS — E038 Other specified hypothyroidism: Secondary | ICD-10-CM | POA: Diagnosis not present

## 2022-10-13 DIAGNOSIS — I10 Essential (primary) hypertension: Secondary | ICD-10-CM | POA: Diagnosis not present

## 2022-10-13 DIAGNOSIS — Z7901 Long term (current) use of anticoagulants: Secondary | ICD-10-CM | POA: Diagnosis not present

## 2022-10-13 DIAGNOSIS — Z23 Encounter for immunization: Secondary | ICD-10-CM | POA: Diagnosis not present

## 2022-10-13 DIAGNOSIS — E782 Mixed hyperlipidemia: Secondary | ICD-10-CM | POA: Diagnosis not present

## 2022-10-13 DIAGNOSIS — G43011 Migraine without aura, intractable, with status migrainosus: Secondary | ICD-10-CM

## 2022-10-13 DIAGNOSIS — J3089 Other allergic rhinitis: Secondary | ICD-10-CM

## 2022-10-13 DIAGNOSIS — I6930 Unspecified sequelae of cerebral infarction: Secondary | ICD-10-CM

## 2022-10-13 DIAGNOSIS — F17219 Nicotine dependence, cigarettes, with unspecified nicotine-induced disorders: Secondary | ICD-10-CM

## 2022-10-13 DIAGNOSIS — Z6833 Body mass index (BMI) 33.0-33.9, adult: Secondary | ICD-10-CM

## 2022-10-13 DIAGNOSIS — G43009 Migraine without aura, not intractable, without status migrainosus: Secondary | ICD-10-CM

## 2022-10-13 MED ORDER — PROMETHAZINE HCL 25 MG/ML IJ SOLN
25.0000 mg | Freq: Once | INTRAMUSCULAR | Status: AC
  Administered 2022-10-13: 25 mg via INTRAMUSCULAR

## 2022-10-13 MED ORDER — KETOROLAC TROMETHAMINE 60 MG/2ML IM SOLN
60.0000 mg | Freq: Once | INTRAMUSCULAR | Status: AC
  Administered 2022-10-13: 60 mg via INTRAMUSCULAR

## 2022-10-13 MED ORDER — BUTALBITAL-APAP-CAFFEINE 50-325-40 MG PO TABS: 1.0000 | ORAL_TABLET | Freq: Four times a day (QID) | ORAL | 2 refills | Status: DC | PRN

## 2022-10-13 MED ORDER — LINACLOTIDE 145 MCG PO CAPS: ORAL_CAPSULE | ORAL | 1 refills | Status: DC

## 2022-10-13 MED ORDER — PROMETHAZINE HCL 25 MG PO TABS: 25.0000 mg | ORAL_TABLET | Freq: Once | ORAL | Status: DC

## 2022-10-13 MED ORDER — NIFEDIPINE ER OSMOTIC RELEASE 30 MG PO TB24: 30.0000 mg | ORAL_TABLET | Freq: Every day | ORAL | 1 refills | Status: DC

## 2022-10-13 NOTE — Progress Notes (Signed)
Subjective:  Patient ID: Anna Hayes, female    DOB: May 19, 1951  Age: 72 y.o. MRN: [id number]  CC: Hypertension GERD PVD   HPI:  [name] is a 72 year old African-American female that presents for follow-up of hypertension, hyperlipidemia, and hypothyroidism. She has had a previous CVA with left hemiparesis, left hand contracture, and left BKA.. She reports severe migraine headache today,non-retractable. She has requested treatment for acute migraine and flu vaccine today.    Hypertension, follow-up: She was last seen for hypertension 3 months ago.  BP at that visit was 144/80. Management includes Lisinopril 20 mg QD, Nifedipine 30 mg QD.  She reports excellent compliance with treatment. She is not having side effects.  She is following a Regular diet. She is not exercising. She does smoke.  Use of agents associated with hypertension: NSAIDS.  Outside blood pressures are not being checked  Pertinent labs: Lab Results  Component Value Date   CHOL 140 06/29/2022   HDL 48 06/29/2022   LDLCALC 61 06/29/2022   TRIG 188 (H) 06/29/2022   CHOLHDL 2.9 06/29/2022   Lab Results  Component Value Date   NA 134 06/29/2022   K 5.6 (H) 06/29/2022   CREATININE 1.08 (H) 06/29/2022   EGFR 54 (L) 06/29/2022   GFRNONAA 60 08/19/2020   GLUCOSE 104 (H) 06/29/2022     Lipid/Cholesterol, Follow-up  Last lipid panel Other pertinent labs  Lab Results  Component Value Date   CHOL 140 06/29/2022   HDL 48 06/29/2022   LDLCALC 61 06/29/2022   TRIG 188 (H) 06/29/2022   CHOLHDL 2.9 06/29/2022   Lab Results  Component Value Date   ALT 19 06/29/2022   AST 20 06/29/2022   PLT 466 (H) 06/29/2022   TSH 1.620 06/29/2022      Management includes Simvastatin 40 mg, Lovaza 1 gm BID She reports excellent compliance with treatment. She is not having side effects.    Hypothyroidism: Current treatment is Levothyroxine 25 mcg daily. Last TSH 1.620 on 06/29/2022. She denies any current  symptoms of hypothyroidism. She is adherent to medication regimen and follow-up appointments.   Current Outpatient Medications on File Prior to Visit  Medication Sig Dispense Refill   atenolol (TENORMIN) 50 MG tablet TAKE 1 TABLET BY MOUTH EVERY DAY 90 tablet 1   butalbital-acetaminophen-caffeine (FIORICET) 50-325-40 MG tablet Take 1 tablet by mouth every 6 (six) hours as needed for migraine. 30 tablet 2   cyanocobalamin (,VITAMIN B-12,) 1000 MCG/ML injection Inject into the muscle.     fluticasone (FLONASE) 50 MCG/ACT nasal spray SPRAY 2 SPRAYS INTO EACH NOSTRIL EVERY DAY 48 mL 2   furosemide (LASIX) 20 MG tablet Take 20 mg by mouth daily as needed for fluid.      levothyroxine (SYNTHROID) 50 MCG tablet Take 1 tablet (50 mcg total) by mouth daily. 90 tablet 3   linaclotide (LINZESS) 145 MCG CAPS capsule TAKE 1 CAPSULE BY MOUTH EVERY DAY BEFORE BREAKFAST 90 capsule 1   lisinopril (ZESTRIL) 20 MG tablet TAKE 1 TABLET BY MOUTH EVERY DAY 90 tablet 3   NIFEdipine (PROCARDIA XL) 30 MG 24 hr tablet Take 1 tablet (30 mg total) by mouth daily. 90 tablet 1   omega-3 acid ethyl esters (LOVAZA) 1 g capsule TAKE 2 CAPSULES BY MOUTH 2 TIMES DAILY. 360 capsule 1   omeprazole (PRILOSEC) 40 MG capsule Take 1 capsule (40 mg total) by mouth daily. 90 capsule 1   ondansetron (ZOFRAN) 4 MG tablet Take 1 tablet (4 mg total)  by mouth every 8 (eight) hours as needed for nausea or vomiting. 90 tablet 1   promethazine (PHENERGAN) 25 MG tablet Take 1 tablet (25 mg total) by mouth every 8 (eight) hours as needed. 60 tablet 0   rivaroxaban (XARELTO) 20 MG TABS tablet Take 1 tablet (20 mg total) by mouth daily with supper. 90 tablet 1   simvastatin (ZOCOR) 40 MG tablet TAKE 1 TABLET BY MOUTH EVERY DAY AT NIGHT 90 tablet 0   Ubrogepant (UBRELVY) 100 MG TABS Take 100 mg by mouth once as needed for up to 1 dose (migraine headache). 2 tablet 0   No current facility-administered medications on file prior to visit.   Past  Medical History:  Diagnosis Date   Anemia    Constipation    DVT of lower extremity (deep venous thrombosis) (HCC)    Frequent headaches    Heart murmur    found during pregnancy -  no problems   Hypertension    Muscle pain    Palpitations    Septic thrombophlebitis 08/31/2017   Sinus problem    Stroke [facility])    Past Surgical History:  Procedure Laterality Date   AMPUTATION Left 07/05/2017   Procedure: LEFT ABOVE KNEE AMPUTATION;  Surgeon: Conrad Salem, MD;  Location: [place];  Service: Vascular;  Laterality: Left;   CATARACT EXTRACTION W/PHACO Right 06/12/2018   Procedure: CATARACT EXTRACTION WITH INTRAOCULAR LENS IMPLANT;  Surgeon: Jalene Mullet, MD;  Location: [place];  Service: Ophthalmology;  Laterality: Right;   COLONOSCOPY  11/26/2015   Dr Lyda Jester - follow-up in 5 years   EMBOLECTOMY Bilateral 06/27/2017   Procedure: BILATERAL FEMORAL POPLITEAL EMBOLECTOMY;  Surgeon: Serafina Mitchell, MD;  Location: [facility] OR;  Service: Vascular;  Laterality: Bilateral;   FASCIOTOMY Left 06/27/2017   Procedure: FULL COMPARTMENT LEFT LOWER LEG FASCIOTOMY;  Surgeon: Serafina Mitchell, MD;  Location: [place];  Service: Vascular;  Laterality: Left;   PARTIAL HYSTERECTOMY     PATCH ANGIOPLASTY Left 06/27/2017   Procedure: PATCH ANGIOPLASTY Left Posterior Tibial Artery;  Surgeon: Serafina Mitchell, MD;  Location: [place];  Service: Vascular;  Laterality: Left;   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 07/11/2017   Procedure: REPAIR OF COMPLEX TRACTION RETINAL DETACHMENT WITH ENDO LASER AND ANTIFUNGAL INJECTION;  Surgeon: Jalene Mullet, MD;  Location: [place];  Service: Ophthalmology;  Laterality: Right;   TEE WITHOUT CARDIOVERSION N/A 07/11/2017   Procedure: TRANSESOPHAGEAL ECHOCARDIOGRAM (TEE);  Surgeon: Acie Fredrickson Wonda Cheng, MD;  Location: [facility] ENDOSCOPY;  Service: Cardiovascular;  Laterality: N/A;    Family History  Problem Relation Age of Onset   Breast cancer Mother    Prostate cancer Father     Prostate cancer Brother    Social History   Socioeconomic History   Marital status: Widowed    Spouse name: Not on file   Number of children: Not on file   Years of education: Not on file   Highest education level: Not on file  Occupational History   Not on file  Tobacco Use   Smoking status: Some Days    Packs/day: 0.25    Years: 53.00    Total pack years: 13.25    Types: Cigarettes   Smokeless tobacco: Never  Vaping Use   Vaping Use: Never used  Substance and Sexual Activity   Alcohol use: No   Drug use: No   Sexual activity: Not Currently  Other Topics Concern   Not on file  Social History Narrative  Lives with her daughter   Social Determinants of Health   Financial Resource Strain: Low Risk  (04/13/2022)   Overall Financial Resource Strain (CARDIA)    Difficulty of Paying Living Expenses: Not hard at all  Food Insecurity: No Food Insecurity (07/23/2021)   Hunger Vital Sign    Worried About Running Out of Food in the Last Year: Never true    Ran Out of Food in the Last Year: Never true  Transportation Needs: No Transportation Needs (04/13/2022)   PRAPARE - Hydrologist (Medical): No    Lack of Transportation (Non-Medical): No  Physical Activity: Insufficiently Active (06/29/2022)   Exercise Vital Sign    Days of Exercise per Week: 1 day    Minutes of Exercise per Session: 60 min  Stress: No Stress Concern Present (06/29/2022)   [place]    Feeling of Stress : Not at all  Social Connections: Moderately Isolated (06/29/2022)   Social Connection and Isolation Panel [NHANES]    Frequency of Communication with Friends and Family: More than three times a week    Frequency of Social Gatherings with Friends and Family: More than three times a week    Attends Religious Services: 1 to 4 times per year    Active Member of [employer] or Organizations: No    Attends Theatre manager Meetings: Never    Marital Status: Widowed    Review of Systems  Constitutional:  Negative for chills, fatigue and fever.       Left hemiplegia, left AKA, pt is w/c bound  HENT:  Negative for congestion, ear pain, rhinorrhea and sore throat.   Respiratory:  Negative for cough and shortness of breath.   Cardiovascular:  Negative for chest pain.  Gastrointestinal:  Negative for abdominal pain, constipation, diarrhea, nausea and vomiting.  Genitourinary:  Negative for dysuria and urgency.  Musculoskeletal:  Negative for back pain and myalgias.  Neurological:  Positive for headaches. Negative for dizziness, weakness and light-headedness.  Psychiatric/Behavioral:  Negative for dysphoric mood. The patient is not nervous/anxious.      Objective:  BP (!) 160/82   Pulse 69   Temp 97.6 F (36.4 C)   Ht 5' 2.5" (1.588 m)   Wt 185 lb (83.9 kg)   SpO2 100%   BMI 33.30 kg/m       07/27/2022   10:52 AM 06/29/2022    9:29 AM 03/23/2022    2:07 PM  BP/Weight  Systolic BP 093 267 124  Diastolic BP 68 80 66  Wt. (Lbs) 182 185   BMI 32.76 kg/m2 33.3 kg/m2     Physical Exam Vitals reviewed.  Constitutional:      Appearance: She is obese. She is ill-appearing (facial grimacing).  HENT:     Right Ear: Tympanic membrane normal.     Left Ear: Tympanic membrane normal.     Nose: Nose normal.     Mouth/Throat:     Mouth: Mucous membranes are moist.  Cardiovascular:     Rate and Rhythm: Normal rate and regular rhythm.     Pulses: Normal pulses.     Heart sounds: Normal heart sounds.  Pulmonary:     Effort: Pulmonary effort is normal.     Breath sounds: Normal breath sounds.  Abdominal:     General: Bowel sounds are normal.     Palpations: Abdomen is soft.  Musculoskeletal:     Left hand: Deformity (contracted)  present.     Left Lower Extremity: Left leg is amputated above knee.  Skin:    General: Skin is warm and dry.     Capillary Refill: Capillary refill takes less  than 2 seconds.  Neurological:     General: No focal deficit present.     Mental Status: She is alert and oriented to person, place, and time.     Gait: Gait abnormal (pt in w/c, non ambulatory).     Comments: Left hemiparesis  Psychiatric:        Mood and Affect: Mood normal.        Behavior: Behavior normal.        Lab Results  Component Value Date   WBC 7.0 06/29/2022   HGB 11.6 06/29/2022   HCT 35.3 06/29/2022   PLT 466 (H) 06/29/2022   GLUCOSE 104 (H) 06/29/2022   CHOL 140 06/29/2022   TRIG 188 (H) 06/29/2022   HDL 48 06/29/2022   LDLCALC 61 06/29/2022   ALT 19 06/29/2022   AST 20 06/29/2022   NA 134 06/29/2022   K 5.6 (H) 06/29/2022   CL 99 06/29/2022   CREATININE 1.08 (H) 06/29/2022   BUN 16 06/29/2022   CO2 23 06/29/2022   TSH 1.620 06/29/2022   INR 1.21 06/12/2018      Assessment & Plan:   1. Benign essential HTN-not at goal - CBC with Differential/Platelet - Comprehensive metabolic panel -continue Nifedipine 30 mg and Lisinopril 20 mg QD -heart healthy diet  2. Secondary hypothyroidism-well controlled - T4, free - TSH -continue Levothyroxine 50 mcg  3. Mixed hyperlipidemia-not at goal - CBC with Differential/Platelet - Comprehensive metabolic panel - Lipid panel -continue Zocor 40 mg QD  4. PVD (peripheral vascular disease) (HCC)-stable - CBC with Differential/Platelet - Comprehensive metabolic panel -continue Xarelto 20 mg (hold evening dose tonight)   5. Unilateral AKA, left (HCC) -assistance with ADLs as needed  6. Long term (current) use of anticoagulants - CBC with Differential/Platelet - Comprehensive metabolic panel -fall and bleeding precautions -continue Xarelto 20 mg   7. Left hemiplegia ([place]) -assistance with ADLs as needed  8. Intractable migraine without aura and with status migrainosus - ketorolac (TORADOL) injection 60 mg - promethazine (PHENERGAN) injection 25 mg  9. Cigarette nicotine dependence with  nicotine-induced disorder - CBC with Differential/Platelet - Comprehensive metabolic panel - Lipid panel -recommend smoking cessation  10. Need for immunization against influenza - Flu Vaccine QUAD High Dose(Fluad)  11. History of CVA with residual deficit - CBC with Differential/Platelet - Comprehensive metabolic panel  12. Mobility impaired -assistance with ADLs as needed  13. Class 1 obesity due to excess calories with serious comorbidity and body mass index (BMI) of 33.0 to 33.9 in adult - CBC with Differential/Platelet - Comprehensive metabolic panel - Lipid panel - T4, free - TSH   Flu shot given in office today We will call you with lab results HOLD XARELTO TODAY, RESUME TOMORROW Toradol and Phenergan injection given in office today for migraine headache Continue medications Follow-up in 87-month    Follow-up: 36-month An After Visit Summary was printed and given to the patient.  I, ShRip HarbourNP, have reviewed all documentation for this visit. The documentation on 10/13/22 for the exam, diagnosis, procedures, and orders are all accurate and complete.   ShRip HarbourNP [phone]

## 2022-10-13 NOTE — Patient Instructions (Addendum)
Flu shot given in office today We will call you with lab results [place], RESUME TOMORROW Toradol and Phenergan injection given in office today for migraine headache Continue medications Follow-up in 47-month    Migraine Headache A migraine headache is a very strong throbbing pain on one side or both sides of your head. This type of headache can also cause other symptoms. It can last from 4 hours to 3 days. Talk with your doctor about what things may bring on (trigger) this condition. What are the causes? The exact cause of this condition is not known. This condition may be triggered or caused by: Drinking alcohol. Smoking. Taking medicines, such as: Medicine used to treat chest pain (nitroglycerin). Birth control pills. Estrogen. Some blood pressure medicines. Eating or drinking certain products. Doing physical activity. Other things that may trigger a migraine headache include: Having a menstrual period. Pregnancy. Hunger. Stress. Not getting enough sleep or getting too much sleep. Weather changes. Tiredness (fatigue). What increases the risk? Being [id number] old. Being female. Having a family history of migraine headaches. Being Caucasian. Having depression or anxiety. Being very overweight. What are the signs or symptoms? A throbbing pain. This pain may: Happen in any area of the head, such as on one side or both sides. Make it hard to do daily activities. Get worse with physical activity. Get worse around bright lights or loud noises. Other symptoms may include: Feeling sick to your stomach (nauseous). Vomiting. Dizziness. Being sensitive to bright lights, loud noises, or smells. Before you get a migraine headache, you may get warning signs (an aura). An aura may include: Seeing flashing lights or having blind spots. Seeing bright spots, halos, or zigzag lines. Having tunnel vision or blurred vision. Having numbness or a tingling feeling. Having  trouble talking. Having weak muscles. Some people have symptoms after a migraine headache (postdromal phase), such as: Tiredness. Trouble thinking (concentrating). How is this treated? Taking medicines that: Relieve pain. Relieve the feeling of being sick to your stomach. Prevent migraine headaches. Treatment may also include: Having acupuncture. Avoiding foods that bring on migraine headaches. Learning ways to control your body functions (biofeedback). Therapy to help you know and deal with negative thoughts (cognitive behavioral therapy). Follow these instructions at home: Medicines Take over-the-counter and prescription medicines only as told by your doctor. Ask your doctor if the medicine prescribed to you: Requires you to avoid driving or using heavy machinery. Can cause trouble pooping (constipation). You may need to take these steps to prevent or treat trouble pooping: Drink enough fluid to keep your pee (urine) pale yellow. Take over-the-counter or prescription medicines. Eat foods that are high in fiber. These include beans, whole grains, and fresh fruits and vegetables. Limit foods that are high in fat and sugar. These include fried or sweet foods. Lifestyle Do not drink alcohol. Do not use any products that contain nicotine or tobacco, such as cigarettes, e-cigarettes, and chewing tobacco. If you need help quitting, ask your doctor. Get at least 8 hours of sleep every night. Limit and deal with stress. General instructions Keep a journal to find out what may bring on your migraine headaches. For example, write down: What you eat and drink. How much sleep you get. Any change in what you eat or drink. Any change in your medicines. If you have a migraine headache: Avoid things that make your symptoms worse, such as bright lights. It may help to lie down in a dark, quiet room. Do not drive  or use heavy machinery. Ask your doctor what activities are safe for you. Keep  all follow-up visits as told by your doctor. This is important. Contact a doctor if: You get a migraine headache that is different or worse than others you have had. You have more than 15 headache days in one month. Get help right away if: Your migraine headache gets very bad. Your migraine headache lasts longer than 72 hours. You have a fever. You have a stiff neck. You have trouble seeing. Your muscles feel weak or like you cannot control them. You start to lose your balance a lot. You start to have trouble walking. You pass out (faint). You have a seizure. Summary A migraine headache is a very strong throbbing pain on one side or both sides of your head. These headaches can also cause other symptoms. This condition may be treated with medicines and changes to your lifestyle. Keep a journal to find out what may bring on your migraine headaches. Contact a doctor if you get a migraine headache that is different or worse than others you have had. Contact your doctor if you have more than 15 headache days in a month. This information is not intended to replace advice given to you by your health care provider. Make sure you discuss any questions you have with your health care provider. Document Revised: 02/24/2022 Document Reviewed: 10/25/2018 Elsevier Patient Education  [place].

## 2022-10-14 ENCOUNTER — Telehealth: Payer: Self-pay

## 2022-10-14 LAB — CBC WITH DIFFERENTIAL/PLATELET
Basophils Absolute: 0 10*3/uL (ref 0.0–0.2)
Basos: 0 %
EOS (ABSOLUTE): 0.2 10*3/uL (ref 0.0–0.4)
Eos: 3 %
Hematocrit: 33 % — ABNORMAL LOW (ref 34.0–46.6)
Hemoglobin: 10.2 g/dL — ABNORMAL LOW (ref 11.1–15.9)
Immature Grans (Abs): 0 10*3/uL (ref 0.0–0.1)
Immature Granulocytes: 0 %
Lymphocytes Absolute: 2.3 10*3/uL (ref 0.7–3.1)
Lymphs: 32 %
MCH: 24.9 pg — ABNORMAL LOW (ref 26.6–33.0)
MCHC: 30.9 g/dL — ABNORMAL LOW (ref 31.5–35.7)
MCV: 81 fL (ref 79–97)
Monocytes Absolute: 0.8 10*3/uL (ref 0.1–0.9)
Monocytes: 12 %
Neutrophils Absolute: 3.7 10*3/uL (ref 1.4–7.0)
Neutrophils: 53 %
Platelets: 451 10*3/uL — ABNORMAL HIGH (ref 150–450)
RBC: 4.1 x10E6/uL (ref 3.77–5.28)
RDW: 14.8 % (ref 11.7–15.4)
WBC: 7 10*3/uL (ref 3.4–10.8)

## 2022-10-14 LAB — T4, FREE: Free T4: 0.82 ng/dL (ref 0.82–1.77)

## 2022-10-14 LAB — COMPREHENSIVE METABOLIC PANEL
ALT: 27 IU/L (ref 0–32)
AST: 30 IU/L (ref 0–40)
Albumin/Globulin Ratio: 1.3 (ref 1.2–2.2)
Albumin: 4.1 g/dL (ref 3.8–4.8)
Alkaline Phosphatase: 99 IU/L (ref 44–121)
BUN/Creatinine Ratio: 14 (ref 12–28)
BUN: 13 mg/dL (ref 8–27)
Bilirubin Total: <0.2 mg/dL (ref 0.0–1.2)
CO2: 18 mmol/L — ABNORMAL LOW (ref 20–29)
Calcium: 9.4 mg/dL (ref 8.7–10.3)
Chloride: 104 mmol/L (ref 96–106)
Creatinine, Ser: 0.91 mg/dL (ref 0.57–1.00)
Globulin, Total: 3.2 g/dL (ref 1.5–4.5)
Glucose: 81 mg/dL (ref 70–99)
Potassium: 5.2 mmol/L (ref 3.5–5.2)
Sodium: 137 mmol/L (ref 134–144)
Total Protein: 7.3 g/dL (ref 6.0–8.5)
eGFR: 67 mL/min/{1.73_m2} (ref >59)

## 2022-10-14 LAB — LIPID PANEL
Chol/HDL Ratio: 3.3 ratio (ref 0.0–4.4)
Cholesterol, Total: 153 mg/dL (ref 100–199)
HDL: 46 mg/dL (ref >39)
LDL Chol Calc (NIH): 81 mg/dL (ref 0–99)
Triglycerides: 150 mg/dL — ABNORMAL HIGH (ref 0–149)
VLDL Cholesterol Cal: 26 mg/dL (ref 5–40)

## 2022-10-14 LAB — TSH: TSH: 2.2 u[IU]/mL (ref 0.450–4.500)

## 2022-10-14 LAB — CARDIOVASCULAR RISK ASSESSMENT

## 2022-10-14 NOTE — Telephone Encounter (Signed)
Anna Hayes called with questions about her insurance covering someone to help her with errands and grocery shopping and other errands.  I explained that this is not usually covered with insurance.  She is going to call her insurance and discuss and call us back if needed.

## 2022-10-15 NOTE — Addendum Note (Signed)
Addended byRochel Brome on: 10/15/2022 08:09 PM   Modules accepted: Level of Service

## 2022-10-24 ENCOUNTER — Other Ambulatory Visit: Payer: Self-pay

## 2022-10-24 DIAGNOSIS — D649 Anemia, unspecified: Secondary | ICD-10-CM

## 2022-10-28 ENCOUNTER — Other Ambulatory Visit: Payer: Medicare HMO

## 2022-10-28 DIAGNOSIS — D649 Anemia, unspecified: Secondary | ICD-10-CM

## 2022-10-29 LAB — CBC WITH DIFFERENTIAL/PLATELET
Basophils Absolute: 0.1 10*3/uL (ref 0.0–0.2)
Basos: 1 %
EOS (ABSOLUTE): 0.2 10*3/uL (ref 0.0–0.4)
Eos: 3 %
Hematocrit: 34.6 % (ref 34.0–46.6)
Hemoglobin: 10.8 g/dL — ABNORMAL LOW (ref 11.1–15.9)
Immature Grans (Abs): 0 10*3/uL (ref 0.0–0.1)
Immature Granulocytes: 0 %
Lymphocytes Absolute: 2.2 10*3/uL (ref 0.7–3.1)
Lymphs: 33 %
MCH: 24.7 pg — ABNORMAL LOW (ref 26.6–33.0)
MCHC: 31.2 g/dL — ABNORMAL LOW (ref 31.5–35.7)
MCV: 79 fL (ref 79–97)
Monocytes Absolute: 0.8 10*3/uL (ref 0.1–0.9)
Monocytes: 12 %
Neutrophils Absolute: 3.4 10*3/uL (ref 1.4–7.0)
Neutrophils: 51 %
Platelets: 474 10*3/uL — ABNORMAL HIGH (ref 150–450)
RBC: 4.38 x10E6/uL (ref 3.77–5.28)
RDW: 14.6 % (ref 11.7–15.4)
WBC: 6.8 10*3/uL (ref 3.4–10.8)

## 2022-10-29 LAB — IRON,TIBC AND FERRITIN PANEL
Ferritin: 32 ng/mL (ref 15–150)
Iron Saturation: 21 % (ref 15–55)
Iron: 91 ug/dL (ref 27–139)
Total Iron Binding Capacity: 428 ug/dL (ref 250–450)
UIBC: 337 ug/dL (ref 118–369)

## 2022-10-31 ENCOUNTER — Other Ambulatory Visit: Payer: Self-pay

## 2022-10-31 MED ORDER — RIVAROXABAN 20 MG PO TABS: 20.0000 mg | ORAL_TABLET | Freq: Every day | ORAL | 1 refills | Status: DC

## 2022-11-07 ENCOUNTER — Telehealth: Payer: Self-pay

## 2022-11-07 NOTE — Progress Notes (Signed)
Care Management & Coordination Services Pharmacy Team  Reason for Encounter: Hypertension  Contacted patient to discuss hypertension disease state. Spoke with patient on 11/07/2022     Current antihypertensive regimen:  Lisinopril 20 mg daily Atenolol 50 mg daily  Patient verbally confirms she is taking the above medications as directed. Yes  How often are you checking your Blood Pressure? 1-2x per week  she checks her blood pressure in the morning before taking her medication.  Current home BP readings: 02-11 138/72, 02-05 136/78, 02-01 132/68 Wrist or arm cuff: arm  Caffeine intake: Patient stated limited. May drink a sip of soda once in a while  Salt intake: Limited  OTC medications including pseudoephedrine or NSAIDs? None  Any readings above 180/100? No  What recent interventions/DTPs have been made by any provider to improve Blood Pressure control since last CPP Visit: None  Any recent hospitalizations or ED visits since last visit with CPP? No  What diet changes have been made to improve Blood Pressure Control?  Patient limits salt intake   What exercise is being done to improve your Blood Pressure Control?  Patient states she walks some days and cleans around the house   Adherence Review: Is the patient currently on ACE/ARB medication? Yes Does the patient have >5 day gap between last estimated fill dates? No  Star Rating Drugs:  Lisinopril 20 mg- Last filled 08-27-2022 90 DS. Previous 05-19-2022 90 DS Simvastatin 40 mg- Last filled 09-23-2022 90 DS. Previous 06-29-2022 90 DS  Chart Updates: Recent office visits:  10-13-2022 Rip Harbour, NP. Hemo= 10.2, Hema= 33.0, MCH= 24.9, MCHC= 30.9, Platelets= 451. CO2= 18. Trig= 150. Phenergan, toradol and flu injection given.  Recent consult visits:  None  Hospital visits:  None in previous 6 months  Medications: Outpatient Encounter Medications as of 11/07/2022  Medication Sig   atenolol (TENORMIN) 50 MG  tablet TAKE 1 TABLET BY MOUTH EVERY DAY   butalbital-acetaminophen-caffeine (FIORICET) 50-325-40 MG tablet Take 1 tablet by mouth every 6 (six) hours as needed for migraine.   cyanocobalamin (,VITAMIN B-12,) 1000 MCG/ML injection Inject into the muscle.   fluticasone (FLONASE) 50 MCG/ACT nasal spray SPRAY 2 SPRAYS INTO EACH NOSTRIL EVERY DAY   furosemide (LASIX) 20 MG tablet Take 20 mg by mouth daily as needed for fluid.    levothyroxine (SYNTHROID) 50 MCG tablet Take 1 tablet (50 mcg total) by mouth daily.   linaclotide (LINZESS) 145 MCG CAPS capsule TAKE 1 CAPSULE BY MOUTH EVERY DAY BEFORE BREAKFAST   lisinopril (ZESTRIL) 20 MG tablet TAKE 1 TABLET BY MOUTH EVERY DAY   NIFEdipine (PROCARDIA XL) 30 MG 24 hr tablet Take 1 tablet (30 mg total) by mouth daily.   omega-3 acid ethyl esters (LOVAZA) 1 g capsule TAKE 2 CAPSULES BY MOUTH 2 TIMES DAILY.   omeprazole (PRILOSEC) 40 MG capsule Take 1 capsule (40 mg total) by mouth daily.   ondansetron (ZOFRAN) 4 MG tablet Take 1 tablet (4 mg total) by mouth every 8 (eight) hours as needed for nausea or vomiting.   promethazine (PHENERGAN) 25 MG tablet Take 1 tablet (25 mg total) by mouth every 8 (eight) hours as needed.   rivaroxaban (XARELTO) 20 MG TABS tablet Take 1 tablet (20 mg total) by mouth daily with supper.   simvastatin (ZOCOR) 40 MG tablet TAKE 1 TABLET BY MOUTH EVERY DAY AT NIGHT   Ubrogepant (UBRELVY) 100 MG TABS Take 100 mg by mouth once as needed for up to 1 dose (migraine headache).  No facility-administered encounter medications on file as of 11/07/2022.    Recent Office Vitals: BP Readings from Last 3 Encounters:  10/13/22 (!) 160/82  07/27/22 130/68  06/29/22 (!) 144/80   Pulse Readings from Last 3 Encounters:  10/13/22 69  07/27/22 63  06/29/22 86    Wt Readings from Last 3 Encounters:  10/13/22 185 lb (83.9 kg)  07/27/22 182 lb (82.6 kg)  06/29/22 185 lb (83.9 kg)     Kidney Function Lab Results  Component Value  Date/Time   CREATININE 0.91 10/13/2022 03:46 PM   CREATININE 1.08 (H) 06/29/2022 10:06 AM   CREATININE 0.47 (L) 08/31/2017 05:20 PM   GFRNONAA 60 08/19/2020 01:41 PM   GFRNONAA 101 08/31/2017 05:20 PM   GFRAA 70 08/19/2020 01:41 PM   GFRAA 118 08/31/2017 05:20 PM       Latest Ref Rng & Units 10/13/2022    3:46 PM 06/29/2022   10:06 AM 03/23/2022    2:31 PM  BMP  Glucose 70 - 99 mg/dL 81  104  92   BUN 8 - 27 mg/dL 13  16  16   $ Creatinine 0.57 - 1.00 mg/dL 0.91  1.08  0.85   BUN/Creat Ratio 12 - 28 14  15  19   $ Sodium 134 - 144 mmol/L 137  134  137   Potassium 3.5 - 5.2 mmol/L 5.2  5.6  5.1   Chloride 96 - 106 mmol/L 104  99  100   CO2 20 - 29 mmol/L 18  23  22   $ Calcium 8.7 - 10.3 mg/dL 9.4  10.0  9.8      Paul Smiths Clinical Pharmacist Assistant [phone]

## 2022-11-22 ENCOUNTER — Other Ambulatory Visit: Payer: Self-pay | Admitting: Family Medicine

## 2022-11-22 DIAGNOSIS — D75839 Thrombocytosis, unspecified: Secondary | ICD-10-CM

## 2022-11-22 DIAGNOSIS — D649 Anemia, unspecified: Secondary | ICD-10-CM

## 2022-11-22 NOTE — Progress Notes (Signed)
Referral sent. Dr. Tobie Poet

## 2022-12-06 ENCOUNTER — Telehealth: Payer: Self-pay

## 2022-12-06 ENCOUNTER — Other Ambulatory Visit: Payer: Self-pay

## 2022-12-06 DIAGNOSIS — R110 Nausea: Secondary | ICD-10-CM

## 2022-12-06 MED ORDER — ONDANSETRON HCL 4 MG PO TABS: 4.0000 mg | ORAL_TABLET | Freq: Three times a day (TID) | ORAL | 1 refills | Status: DC | PRN

## 2022-12-06 NOTE — Progress Notes (Signed)
Care Management & Coordination Services Pharmacy Team  Reason for Encounter: Hypertension  Contacted patient to discuss hypertension disease state. Spoke with patient on 12/06/2022    Current antihypertensive regimen:  Lisinopril 20 mg daily Atenolol 50 mg daily  Patient verbally confirms she is taking the above medications as directed. Yes  How often are you checking your Blood Pressure? 1-2x per week  she checks her blood pressure in the morning after taking her medication.  Current home BP readings: 116/55, 120/44, 135/40 Wrist or arm cuff: arm Caffeine intake:Patient stated limited. May drink a sip of soda once in a while  Salt intake: Limited  OTC medications including pseudoephedrine or NSAIDs?None  Any readings above 180/100? No  What recent interventions/DTPs have been made by any provider to improve Blood Pressure control since last CPP Visit: None  Any recent hospitalizations or ED visits since last visit with CPP? No  What diet changes have been made to improve Blood Pressure Control?  Patient limits salt intake and drinks plenty of water daily  What exercise is being done to improve your Blood Pressure Control?  Patient states she walks some days and cleans around the house   Adherence Review: Is the patient currently on ACE/ARB medication? Yes Does the patient have >5 day gap between last estimated fill dates? No  Star Rating Drugs:  Simvastatin 40 mg- Last filled 09-23-2022 90 DS. Previous 06-29-2022 90 DS Lisinopril 20 mg- Last filled 11-15-2022 90 DS. Previous 08-27-2022 90 DS  Chart Updates: Recent office visits:  None  Recent consult visits:  None  Hospital visits:  None in previous 6 months  Medications: Outpatient Encounter Medications as of 12/06/2022  Medication Sig   atenolol (TENORMIN) 50 MG tablet TAKE 1 TABLET BY MOUTH EVERY DAY   butalbital-acetaminophen-caffeine (FIORICET) 50-325-40 MG tablet Take 1 tablet by mouth every 6 (six)  hours as needed for migraine.   cyanocobalamin (,VITAMIN B-12,) 1000 MCG/ML injection Inject into the muscle.   fluticasone (FLONASE) 50 MCG/ACT nasal spray SPRAY 2 SPRAYS INTO EACH NOSTRIL EVERY DAY   furosemide (LASIX) 20 MG tablet Take 20 mg by mouth daily as needed for fluid.    levothyroxine (SYNTHROID) 50 MCG tablet Take 1 tablet (50 mcg total) by mouth daily.   linaclotide (LINZESS) 145 MCG CAPS capsule TAKE 1 CAPSULE BY MOUTH EVERY DAY BEFORE BREAKFAST   lisinopril (ZESTRIL) 20 MG tablet TAKE 1 TABLET BY MOUTH EVERY DAY   NIFEdipine (PROCARDIA XL) 30 MG 24 hr tablet Take 1 tablet (30 mg total) by mouth daily.   omega-3 acid ethyl esters (LOVAZA) 1 g capsule TAKE 2 CAPSULES BY MOUTH 2 TIMES DAILY.   omeprazole (PRILOSEC) 40 MG capsule Take 1 capsule (40 mg total) by mouth daily.   ondansetron (ZOFRAN) 4 MG tablet Take 1 tablet (4 mg total) by mouth every 8 (eight) hours as needed for nausea or vomiting.   promethazine (PHENERGAN) 25 MG tablet Take 1 tablet (25 mg total) by mouth every 8 (eight) hours as needed.   rivaroxaban (XARELTO) 20 MG TABS tablet Take 1 tablet (20 mg total) by mouth daily with supper.   simvastatin (ZOCOR) 40 MG tablet TAKE 1 TABLET BY MOUTH EVERY DAY AT NIGHT   Ubrogepant (UBRELVY) 100 MG TABS Take 100 mg by mouth once as needed for up to 1 dose (migraine headache).   No facility-administered encounter medications on file as of 12/06/2022.    Recent Office Vitals: BP Readings from Last 3 Encounters:  10/13/22 Marland Kitchen)  160/82  07/27/22 130/68  06/29/22 (!) 144/80   Pulse Readings from Last 3 Encounters:  10/13/22 69  07/27/22 63  06/29/22 86    Wt Readings from Last 3 Encounters:  10/13/22 185 lb (83.9 kg)  07/27/22 182 lb (82.6 kg)  06/29/22 185 lb (83.9 kg)     Kidney Function Lab Results  Component Value Date/Time   CREATININE 0.91 10/13/2022 03:46 PM   CREATININE 1.08 (H) 06/29/2022 10:06 AM   CREATININE 0.47 (L) 08/31/2017 05:20 PM   GFRNONAA 60  08/19/2020 01:41 PM   GFRNONAA 101 08/31/2017 05:20 PM   GFRAA 70 08/19/2020 01:41 PM   GFRAA 118 08/31/2017 05:20 PM       Latest Ref Rng & Units 10/13/2022    3:46 PM 06/29/2022   10:06 AM 03/23/2022    2:31 PM  BMP  Glucose 70 - 99 mg/dL 81  104  92   BUN 8 - 27 mg/dL '13  16  16   '$ Creatinine 0.57 - 1.00 mg/dL 0.91  1.08  0.85   BUN/Creat Ratio 12 - '28 14  15  19   '$ Sodium 134 - 144 mmol/L 137  134  137   Potassium 3.5 - 5.2 mmol/L 5.2  5.6  5.1   Chloride 96 - 106 mmol/L 104  99  100   CO2 20 - 29 mmol/L '18  23  22   '$ Calcium 8.7 - 10.3 mg/dL 9.4  10.0  9.8      [place] Pharmacist Assistant [phone]

## 2022-12-07 NOTE — Telephone Encounter (Cosign Needed)
She said no dizziness. Today it was 146/65

## 2022-12-08 ENCOUNTER — Inpatient Hospital Stay: Payer: Medicare HMO | Attending: Oncology | Admitting: Oncology

## 2022-12-08 ENCOUNTER — Inpatient Hospital Stay: Payer: Medicare HMO

## 2022-12-08 ENCOUNTER — Encounter: Payer: Self-pay | Admitting: Oncology

## 2022-12-08 ENCOUNTER — Other Ambulatory Visit: Payer: Self-pay | Admitting: Pharmacist

## 2022-12-08 VITALS — BP 137/66 | HR 64 | Temp 98.1°F | Resp 18 | Ht 62.5 in | Wt 192.2 lb

## 2022-12-08 DIAGNOSIS — I739 Peripheral vascular disease, unspecified: Secondary | ICD-10-CM

## 2022-12-08 DIAGNOSIS — Z89612 Acquired absence of left leg above knee: Secondary | ICD-10-CM | POA: Diagnosis not present

## 2022-12-08 DIAGNOSIS — Z86718 Personal history of other venous thrombosis and embolism: Secondary | ICD-10-CM | POA: Insufficient documentation

## 2022-12-08 DIAGNOSIS — R69 Illness, unspecified: Secondary | ICD-10-CM | POA: Diagnosis not present

## 2022-12-08 DIAGNOSIS — Z886 Allergy status to analgesic agent status: Secondary | ICD-10-CM | POA: Insufficient documentation

## 2022-12-08 DIAGNOSIS — Z7901 Long term (current) use of anticoagulants: Secondary | ICD-10-CM

## 2022-12-08 DIAGNOSIS — K5730 Diverticulosis of large intestine without perforation or abscess without bleeding: Secondary | ICD-10-CM | POA: Insufficient documentation

## 2022-12-08 DIAGNOSIS — D539 Nutritional anemia, unspecified: Secondary | ICD-10-CM | POA: Diagnosis not present

## 2022-12-08 DIAGNOSIS — Z9189 Other specified personal risk factors, not elsewhere classified: Secondary | ICD-10-CM | POA: Diagnosis not present

## 2022-12-08 DIAGNOSIS — I10 Essential (primary) hypertension: Secondary | ICD-10-CM | POA: Insufficient documentation

## 2022-12-08 DIAGNOSIS — K635 Polyp of colon: Secondary | ICD-10-CM | POA: Insufficient documentation

## 2022-12-08 DIAGNOSIS — Z882 Allergy status to sulfonamides status: Secondary | ICD-10-CM | POA: Diagnosis not present

## 2022-12-08 DIAGNOSIS — D649 Anemia, unspecified: Secondary | ICD-10-CM | POA: Diagnosis not present

## 2022-12-08 DIAGNOSIS — Z880 Allergy status to penicillin: Secondary | ICD-10-CM | POA: Diagnosis not present

## 2022-12-08 DIAGNOSIS — E669 Obesity, unspecified: Secondary | ICD-10-CM

## 2022-12-08 DIAGNOSIS — S78112A Complete traumatic amputation at level between left hip and knee, initial encounter: Secondary | ICD-10-CM

## 2022-12-08 DIAGNOSIS — M791 Myalgia, unspecified site: Secondary | ICD-10-CM | POA: Insufficient documentation

## 2022-12-08 DIAGNOSIS — Z888 Allergy status to other drugs, medicaments and biological substances status: Secondary | ICD-10-CM | POA: Diagnosis not present

## 2022-12-08 DIAGNOSIS — Z8719 Personal history of other diseases of the digestive system: Secondary | ICD-10-CM | POA: Diagnosis not present

## 2022-12-08 DIAGNOSIS — Z803 Family history of malignant neoplasm of breast: Secondary | ICD-10-CM | POA: Diagnosis not present

## 2022-12-08 DIAGNOSIS — Z79899 Other long term (current) drug therapy: Secondary | ICD-10-CM | POA: Diagnosis not present

## 2022-12-08 DIAGNOSIS — Z8249 Family history of ischemic heart disease and other diseases of the circulatory system: Secondary | ICD-10-CM | POA: Insufficient documentation

## 2022-12-08 DIAGNOSIS — Z8673 Personal history of transient ischemic attack (TIA), and cerebral infarction without residual deficits: Secondary | ICD-10-CM | POA: Insufficient documentation

## 2022-12-08 DIAGNOSIS — Z8042 Family history of malignant neoplasm of prostate: Secondary | ICD-10-CM | POA: Insufficient documentation

## 2022-12-08 DIAGNOSIS — S78119A Complete traumatic amputation at level between unspecified hip and knee, initial encounter: Secondary | ICD-10-CM

## 2022-12-08 DIAGNOSIS — F17210 Nicotine dependence, cigarettes, uncomplicated: Secondary | ICD-10-CM | POA: Diagnosis not present

## 2022-12-08 DIAGNOSIS — Z885 Allergy status to narcotic agent status: Secondary | ICD-10-CM | POA: Diagnosis not present

## 2022-12-08 DIAGNOSIS — Z809 Family history of malignant neoplasm, unspecified: Secondary | ICD-10-CM

## 2022-12-08 LAB — CBC WITH DIFFERENTIAL/PLATELET
Abs Immature Granulocytes: 0.02 10*3/uL (ref 0.00–0.07)
Basophils Absolute: 0.1 10*3/uL (ref 0.0–0.1)
Basophils Relative: 1 %
Eosinophils Absolute: 0.4 10*3/uL (ref 0.0–0.5)
Eosinophils Relative: 6 %
HCT: 36.8 % (ref 36.0–46.0)
Hemoglobin: 11.3 g/dL — ABNORMAL LOW (ref 12.0–15.0)
Immature Granulocytes: 0 %
Lymphocytes Relative: 32 %
Lymphs Abs: 2.4 10*3/uL (ref 0.7–4.0)
MCH: 24.9 pg — ABNORMAL LOW (ref 26.0–34.0)
MCHC: 30.7 g/dL (ref 30.0–36.0)
MCV: 81.1 fL (ref 80.0–100.0)
Monocytes Absolute: 0.9 10*3/uL (ref 0.1–1.0)
Monocytes Relative: 12 %
Neutro Abs: 3.7 10*3/uL (ref 1.7–7.7)
Neutrophils Relative %: 49 %
Platelets: 490 10*3/uL — ABNORMAL HIGH (ref 150–400)
RBC: 4.54 MIL/uL (ref 3.87–5.11)
RDW: 15.2 % (ref 11.5–15.5)
WBC: 7.5 10*3/uL (ref 4.0–10.5)
nRBC: 0 % (ref 0.0–0.2)

## 2022-12-08 LAB — COMPREHENSIVE METABOLIC PANEL
ALT: 21 U/L (ref 0–44)
AST: 21 U/L (ref 15–41)
Albumin: 4.2 g/dL (ref 3.5–5.0)
Alkaline Phosphatase: 59 U/L (ref 38–126)
Anion gap: 9 (ref 5–15)
BUN: 18 mg/dL (ref 8–23)
CO2: 25 mmol/L (ref 22–32)
Calcium: 9.2 mg/dL (ref 8.9–10.3)
Chloride: 99 mmol/L (ref 98–111)
Creatinine, Ser: 0.99 mg/dL (ref 0.44–1.00)
GFR, Estimated: 60 mL/min — ABNORMAL LOW (ref >60)
Glucose, Bld: 107 mg/dL — ABNORMAL HIGH (ref 70–99)
Potassium: 4.6 mmol/L (ref 3.5–5.1)
Sodium: 133 mmol/L — ABNORMAL LOW (ref 135–145)
Total Bilirubin: 0.4 mg/dL (ref 0.3–1.2)
Total Protein: 8.2 g/dL — ABNORMAL HIGH (ref 6.5–8.1)

## 2022-12-08 LAB — DIRECT ANTIGLOBULIN TEST (NOT AT ARMC)
DAT, IgG: NEGATIVE
DAT, complement: NEGATIVE

## 2022-12-08 LAB — FOLATE: Folate: 21.6 ng/mL (ref >5.9)

## 2022-12-08 LAB — PROTIME-INR
INR: 1.3 — ABNORMAL HIGH (ref 0.8–1.2)
Prothrombin Time: 15.9 seconds — ABNORMAL HIGH (ref 11.4–15.2)

## 2022-12-08 LAB — FERRITIN: Ferritin: 13 ng/mL (ref 11–307)

## 2022-12-08 LAB — RETICULOCYTES
Immature Retic Fract: 23 % — ABNORMAL HIGH (ref 2.3–15.9)
RBC.: 4.51 MIL/uL (ref 3.87–5.11)
Retic Count, Absolute: 64 10*3/uL (ref 19.0–186.0)
Retic Ct Pct: 1.4 % (ref 0.4–3.1)

## 2022-12-08 LAB — FIBRINOGEN: Fibrinogen: 518 mg/dL — ABNORMAL HIGH (ref 210–475)

## 2022-12-08 LAB — VITAMIN B12: Vitamin B-12: 488 pg/mL (ref 180–914)

## 2022-12-08 LAB — APTT: aPTT: 32 seconds (ref 24–36)

## 2022-12-08 NOTE — Progress Notes (Signed)
[facility] Initial Visit:  Patient Care Team: Cox, Elnita Maxwell, MD as PCP - General (Family Medicine) Lane Hacker, [facility] (Pharmacist)  CHIEF COMPLAINTS/PURPOSE OF CONSULTATION:   HISTORY OF PRESENTING ILLNESS: Anna Hayes 72 y.o. female is here because of anemia Medical history notable for lower extremity DVT, heart murmur, hypertension, myalgias, palpitations, septic thrombophlebitis, CVA, left AKA due to peripheral vascular disease with resultant bilateral femoral artery embolus, fasciotomy, chronic anticoagulation with Xarelto  December 06, 2021: Colonoscopy demonstrated 10 right colon polyps and 9 left colon polyps 0.2 to 0.7 cm in diameter which were removed.  A few diverticula in right colon.  Follow-up colonoscopy recommended in 1 year  October 13, 2022:: WBC 6.8 hemoglobin 10.8 MCV 79 platelet count 474; 51 segs 33 lymphs 12 monos 3 eos 1 basophil Ferritin 32 CMP notable for CO2 of 18  December 08, 2022: [facility] Consult  Patient is on Xarelto because of prior history of CVA and PVD.  She is not taking oral iron.  Has never received PRBC's nor IV iron. Has not yet been called by GI for follow up colonoscopy.  No history of EGD.  Takes tylenol for pain but no NSAIDS.   No pica to ice, uncooked starches Does not want to see a geneticist regarding family history of cancer.    Social:  Widowed.  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  Tobacco claims 1/2 ppd but has smoked up to 2 ppd.  EtOH none  [facility] Mother died 45 breast cancer Father died 67 prostate cancer Sister alive 50 HTN Sister alive 38's HTN Brother died 68 prostate cancer  Review of Systems  Constitutional:  Negative for appetite change, chills, fatigue and fever.       Has been gaining weight  HENT:   Negative for mouth sores, nosebleeds, sore throat and trouble swallowing.   Eyes:  Negative for eye problems and icterus.       Vision changes:  None   Respiratory:  Negative for chest tightness, cough, hemoptysis, shortness of breath and wheezing.        PND:  none Orthopnea:  none DOE:    Cardiovascular:  Negative for chest pain, leg swelling and palpitations.       PND:  none Orthopnea:  none  Gastrointestinal:  Positive for constipation and nausea. Negative for abdominal pain, blood in stool, diarrhea and vomiting.  Endocrine: Negative for hot flashes.       Cold intolerance:  none Heat intolerance:  none  Genitourinary:  Negative for bladder incontinence, difficulty urinating, dysuria, frequency, hematuria and nocturia.   Musculoskeletal:  Positive for gait problem. Negative for arthralgias, back pain, myalgias, neck pain and neck stiffness.  Skin:  Negative for itching, rash and wound.  Neurological:  Positive for gait problem and headaches. Negative for dizziness, extremity weakness, light-headedness, numbness, seizures and speech difficulty.  Hematological:  Negative for adenopathy. Does not bruise/bleed easily.  Psychiatric/Behavioral:  Negative for suicidal ideas. The patient is not nervous/anxious.     MEDICAL HISTORY: Past Medical History:  Diagnosis Date   Anemia    Constipation    DVT of lower extremity (deep venous thrombosis) (HCC)    Frequent headaches    Heart murmur    found during pregnancy -  no problems   Hypertension    Muscle pain    Palpitations    Septic thrombophlebitis 08/31/2017   Sinus problem    Stroke ([place])  SURGICAL HISTORY: Past Surgical History:  Procedure Laterality Date   AMPUTATION Left 07/05/2017   Procedure: LEFT ABOVE KNEE AMPUTATION;  Surgeon: Conrad [place], MD;  Location: [facility];  Service: Vascular;  Laterality: Left;   CATARACT EXTRACTION W/PHACO Right 06/12/2018   Procedure: CATARACT EXTRACTION WITH INTRAOCULAR LENS IMPLANT;  Surgeon: Jalene Mullet, MD;  Location: [facility];  Service: Ophthalmology;  Laterality: Right;   COLONOSCOPY  11/26/2015   Dr Lyda Jester - follow-up in  5 years   EMBOLECTOMY Bilateral 06/27/2017   Procedure: BILATERAL FEMORAL POPLITEAL EMBOLECTOMY;  Surgeon: Serafina Mitchell, MD;  [facility];  Service: Vascular;  Laterality: Bilateral;   FASCIOTOMY Left 06/27/2017   Procedure: FULL COMPARTMENT LEFT LOWER LEG FASCIOTOMY;  Surgeon: Serafina Mitchell, MD;  Location: [place];  Service: Vascular;  Laterality: Left;   PARTIAL HYSTERECTOMY     PATCH ANGIOPLASTY Left 06/27/2017   Procedure: PATCH ANGIOPLASTY Left Posterior Tibial Artery;  Surgeon: Serafina Mitchell, MD;  Location: [place];  Service: Vascular;  Laterality: Left;   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 07/11/2017   Procedure: REPAIR OF COMPLEX TRACTION RETINAL DETACHMENT WITH ENDO LASER AND ANTIFUNGAL INJECTION;  Surgeon: Jalene Mullet, MD;  Location: [facility];  Service: Ophthalmology;  Laterality: Right;   TEE WITHOUT CARDIOVERSION N/A 07/11/2017   Procedure: TRANSESOPHAGEAL ECHOCARDIOGRAM (TEE);  Surgeon: Acie Fredrickson Wonda Cheng, MD;  Location: [facility] ENDOSCOPY;  Service: Cardiovascular;  Laterality: N/A;    SOCIAL HISTORY: Social History   Socioeconomic History   Marital status: Widowed    Spouse name: Not on file   Number of children: Not on file   Years of education: Not on file   Highest education level: Not on file  Occupational History   Not on file  Tobacco Use   Smoking status: Some Days    Packs/day: 0.25    Years: 53.00    Additional pack years: 0.00    Total pack years: 13.25    Types: Cigarettes   Smokeless tobacco: Never  Vaping Use   Vaping Use: Never used  Substance and Sexual Activity   Alcohol use: No   Drug use: No   Sexual activity: Not Currently  Other Topics Concern   Not on file  Social History Narrative   Lives with her daughter   Social Determinants of Health   Financial Resource Strain: Low Risk  (04/13/2022)   Overall Financial Resource Strain (CARDIA)    Difficulty of Paying Living Expenses: Not hard at all  Food Insecurity: No  Food Insecurity (07/23/2021)   Hunger Vital Sign    Worried About Running Out of Food in the Last Year: Never true    [place] in the Last Year: Never true  Transportation Needs: No Transportation Needs (04/13/2022)   PRAPARE - Hydrologist (Medical): No    Lack of Transportation (Non-Medical): No  Physical Activity: Insufficiently Active (06/29/2022)   Exercise Vital Sign    Days of Exercise per Week: 1 day    Minutes of Exercise per Session: 60 min  Stress: No Stress Concern Present (06/29/2022)   [place]    Feeling of Stress : Not at all  Social Connections: Moderately Isolated (06/29/2022)   Social Connection and Isolation Panel [NHANES]    Frequency of Communication with Friends and Family: More than three times a week    Frequency of Social Gatherings with Friends and Family: More than  three times a week    Attends Religious Services: 1 to 4 times per year    Active Member of Clubs or Organizations: No    Attends Archivist Meetings: Never    Marital Status: Widowed  Intimate Partner Violence: Not At Risk (06/29/2022)   Humiliation, Afraid, Rape, and Kick questionnaire    Fear of Current or Ex-Partner: No    Emotionally Abused: No    Physically Abused: No    Sexually Abused: No    FAMILY HISTORY Family History  Problem Relation Age of Onset   Breast cancer Mother    Prostate cancer Father    Prostate cancer Brother     ALLERGIES:  is allergic to latex, codeine, lyrica [pregabalin], nurtec [rimegepant sulfate], penicillins, amlodipine, aspirin, and sulfa antibiotics.  MEDICATIONS:  Current Outpatient Medications  Medication Sig Dispense Refill   atenolol (TENORMIN) 50 MG tablet TAKE 1 TABLET BY MOUTH EVERY DAY 90 tablet 1   butalbital-acetaminophen-caffeine (FIORICET) 50-325-40 MG tablet Take 1 tablet by mouth every 6 (six) hours as needed for migraine. 30  tablet 2   cyanocobalamin (,VITAMIN B-12,) 1000 MCG/ML injection Inject into the muscle.     fluticasone (FLONASE) 50 MCG/ACT nasal spray SPRAY 2 SPRAYS INTO EACH NOSTRIL EVERY DAY 48 mL 2   furosemide (LASIX) 20 MG tablet Take 20 mg by mouth daily as needed for fluid.      levothyroxine (SYNTHROID) 50 MCG tablet Take 1 tablet (50 mcg total) by mouth daily. 90 tablet 3   linaclotide (LINZESS) 145 MCG CAPS capsule TAKE 1 CAPSULE BY MOUTH EVERY DAY BEFORE BREAKFAST 90 capsule 1   lisinopril (ZESTRIL) 20 MG tablet TAKE 1 TABLET BY MOUTH EVERY DAY 90 tablet 3   NIFEdipine (PROCARDIA XL) 30 MG 24 hr tablet Take 1 tablet (30 mg total) by mouth daily. 90 tablet 1   omega-3 acid ethyl esters (LOVAZA) 1 g capsule TAKE 2 CAPSULES BY MOUTH 2 TIMES DAILY. 360 capsule 1   omeprazole (PRILOSEC) 40 MG capsule Take 1 capsule (40 mg total) by mouth daily. 90 capsule 1   ondansetron (ZOFRAN) 4 MG tablet Take 1 tablet (4 mg total) by mouth every 8 (eight) hours as needed for nausea or vomiting. 90 tablet 1   rivaroxaban (XARELTO) 20 MG TABS tablet Take 1 tablet (20 mg total) by mouth daily with supper. 90 tablet 1   simvastatin (ZOCOR) 40 MG tablet TAKE 1 TABLET BY MOUTH EVERY DAY AT NIGHT 90 tablet 0   Ubrogepant (UBRELVY) 100 MG TABS Take 100 mg by mouth once as needed for up to 1 dose (migraine headache). 2 tablet 0   No current facility-administered medications for this visit.    PHYSICAL EXAMINATION:  ECOG PERFORMANCE STATUS: 1 - Symptomatic but completely ambulatory   Vitals:   12/08/22 1104  BP: 137/66  Pulse: 64  Resp: 18  Temp: 98.1 F (36.7 C)  SpO2: 97%    Filed Weights   12/08/22 1104  Weight: 192 lb 3.2 oz (87.2 kg)     Physical Exam Vitals and nursing note reviewed.  Constitutional:      General: She is not in acute distress.    Appearance: Normal appearance. She is obese. She is not toxic-appearing or diaphoretic.     Comments: Here with daughter.  Seated in wheelchair  HENT:      Head: Normocephalic and atraumatic.     Right Ear: External ear normal.     Left Ear: External ear normal.  Nose: Nose normal. No congestion or rhinorrhea.  Eyes:     General: No scleral icterus.    Extraocular Movements: Extraocular movements intact.     Conjunctiva/sclera: Conjunctivae normal.     Pupils: Pupils are equal, round, and reactive to light.  Cardiovascular:     Rate and Rhythm: Normal rate.     Heart sounds: No murmur heard.    No friction rub. No gallop.  Abdominal:     General: Bowel sounds are normal.     Palpations: Abdomen is soft.  Musculoskeletal:        General: No swelling or tenderness.     Cervical back: Normal range of motion and neck supple. No rigidity or tenderness.     Comments: Left AKA  Lymphadenopathy:     Head:     Right side of head: No submental, submandibular, tonsillar, preauricular, posterior auricular or occipital adenopathy.     Left side of head: No submental, submandibular, tonsillar, preauricular, posterior auricular or occipital adenopathy.     Cervical: No cervical adenopathy.     Right cervical: No superficial, deep or posterior cervical adenopathy.    Left cervical: No superficial, deep or posterior cervical adenopathy.     Upper Body:     Right upper body: No supraclavicular, axillary, pectoral or epitrochlear adenopathy.     Left upper body: No supraclavicular, axillary, pectoral or epitrochlear adenopathy.  Skin:    General: Skin is warm.     Coloration: Skin is not jaundiced.  Neurological:     General: No focal deficit present.     Mental Status: She is alert and oriented to person, place, and time.     Cranial Nerves: No cranial nerve deficit.     Motor: Weakness present.     Comments: Left hemiparesis  Psychiatric:        Mood and Affect: Mood normal.        Behavior: Behavior normal.        Thought Content: Thought content normal.        Judgment: Judgment normal.     LABORATORY DATA: I have personally  reviewed the data as listed:  No visits with results within 1 Month(s) from this visit.  Latest known visit with results is:  Lab on 10/28/2022  Component Date Value Ref Range Status   Total Iron Binding Capacity 10/28/2022 428  250 - 450 ug/dL Final   UIBC 10/28/2022 337  118 - 369 ug/dL Final   Iron 10/28/2022 91  27 - 139 ug/dL Final   Iron Saturation 10/28/2022 21  15 - 55 % Final   Ferritin 10/28/2022 32  15 - 150 ng/mL Final   WBC 10/28/2022 6.8  3.4 - 10.8 x10E3/uL Final   RBC 10/28/2022 4.38  3.77 - 5.28 x10E6/uL Final   Hemoglobin 10/28/2022 10.8 (L)  11.1 - 15.9 g/dL Final   Hematocrit 10/28/2022 34.6  34.0 - 46.6 % Final   MCV 10/28/2022 79  79 - 97 fL Final   MCH 10/28/2022 24.7 (L)  26.6 - 33.0 pg Final   MCHC 10/28/2022 31.2 (L)  31.5 - 35.7 g/dL Final   RDW 10/28/2022 14.6  11.7 - 15.4 % Final   Platelets 10/28/2022 474 (H)  150 - 450 x10E3/uL Final   Neutrophils 10/28/2022 51  Not Estab. % Final   Lymphs 10/28/2022 33  Not Estab. % Final   Monocytes 10/28/2022 12  Not Estab. % Final   Eos 10/28/2022 3  Not Estab. %  Final   Basos 10/28/2022 1  Not Estab. % Final   Neutrophils Absolute 10/28/2022 3.4  1.4 - 7.0 x10E3/uL Final   Lymphocytes Absolute 10/28/2022 2.2  0.7 - 3.1 x10E3/uL Final   Monocytes Absolute 10/28/2022 0.8  0.1 - 0.9 x10E3/uL Final   EOS (ABSOLUTE) 10/28/2022 0.2  0.0 - 0.4 x10E3/uL Final   Basophils Absolute 10/28/2022 0.1  0.0 - 0.2 x10E3/uL Final   Immature Granulocytes 10/28/2022 0  Not Estab. % Final   Immature Grans (Abs) 10/28/2022 0.0  0.0 - 0.1 x10E3/uL Final    RADIOGRAPHIC STUDIES: I have personally reviewed the radiological images as listed and agree with the findings in the report  No results found.  ASSESSMENT/PLAN  Anemia:  Etiology likely multifactorial with possible culprits being 1) Iron deficiency from occult GI blood loss exacerbated by anticoagulant therapy 2) CKD 3) Chronic inflammation   Will obtain CBC with diff, CMP,  Ferritin, B12, folate, retic count,  DAT, Haptoglobin, SPEP with IEP, free light chains, Copper and Zinc levels, Hgb electrophoresis.  Referring back to GI for consideration of endoscopy, particularly given history of colon polyps  Therapeutics:  Since patient has anemia and has not tolerated/been compliant with oral iron will arrange for IV iron replacement.   Given the severe symptoms we prefer to replete iron stores in one or two visits rather than over the course of several months.  In addition ongoing blood loss exceeds the capacity of oral iron to meet needs. A discussion regarding risks was had with the patient.  IV iron has the potential to cause allergic reactions, including potentially life-threatening anaphylaxis.   IV iron may be associated with non-allergic infusion reactions including self-limiting urticaria, palpitations, dizziness, and neck and back spasm; generally, these occur in <1 percent of individuals and do not progress to more serious reactions. The non-allergic reaction consisting of flushing of the face and myalgias of the chest and back.   After discussion of the risks and benefits of IV iron therapy patient has elected to proceed with parenteral iron therapy.    Arterial thrombosis-  Has history of CVA and limb loss.  Risk factors are DM Type II, Tobacco use December 08 2022- On chronic anticoagulation with Eliquis.  A change to antiplatelet therapy may be appropriate.  Smoking cessation would be very beneficial in terms of risk reduction  Family history of malignancy  December 08 2022-- Offered appointment with Genetics.  Patient declined   Cancer Staging  No matching staging information was found for the patient.   No problem-specific Assessment & Plan notes found for this encounter.    Orders Placed This Encounter  Procedures   Ferritin    Standing Status:   Future    Number of Occurrences:   1    Standing Expiration Date:   12/08/2023   Folate    Standing Status:    Future    Number of Occurrences:   1    Standing Expiration Date:   12/08/2023   Haptoglobin    Standing Status:   Future    Number of Occurrences:   1    Standing Expiration Date:   12/08/2023   Zinc    Standing Status:   Future    Number of Occurrences:   1    Standing Expiration Date:   12/08/2023   Vitamin B12    Standing Status:   Future    Number of Occurrences:   1    Standing Expiration Date:  12/08/2023   Reticulocytes    Standing Status:   Future    Number of Occurrences:   1    Standing Expiration Date:   12/08/2023   Kappa/lambda light chains    Standing Status:   Future    Number of Occurrences:   1    Standing Expiration Date:   12/08/2023   Multiple Myeloma Panel (SPEP&IFE w/QIG)    Standing Status:   Future    Number of Occurrences:   1    Standing Expiration Date:   12/08/2023   Hgb Fractionation Cascade    Standing Status:   Future    Number of Occurrences:   1    Standing Expiration Date:   12/08/2023   Copper, serum    Standing Status:   Future    Number of Occurrences:   1    Standing Expiration Date:   12/08/2023   CBC with Differential/Platelet   Comprehensive metabolic panel   Fibrinogen    Standing Status:   Future    Number of Occurrences:   1    Standing Expiration Date:   12/08/2023   APTT    Standing Status:   Future    Number of Occurrences:   1    Standing Expiration Date:   12/08/2023   Protime-INR    Standing Status:   Future    Number of Occurrences:   1    Standing Expiration Date:   12/08/2023   Ambulatory referral to Gastroenterology    Referral Priority:   Routine    Referral Type:   Consultation    Referral Reason:   Specialty Services Required    Referred to Provider:   Kyra Leyland, MD    Number of Visits Requested:   1   Direct antiglobulin test (not at [facility])    Standing Status:   Future    Number of Occurrences:   1    Standing Expiration Date:   12/08/2023    50  minutes was spent in patient care.  This included time  spent preparing to see the patient (e.g., review of tests), obtaining and/or reviewing separately obtained history, counseling and educating the patient/family/caregiver, ordering medications, tests, or procedures; documenting clinical information in the electronic or other health record, independently interpreting results and communicating results to the patient/family/caregiver as well as coordination of care.       All questions were answered. The patient knows to call the clinic with any problems, questions or concerns.  This note was electronically signed.    Barbee Cough, MD  12/08/2022 12:19 PM

## 2022-12-08 NOTE — Progress Notes (Signed)
..  Feraheme orders changed to venofer due to insurance plan preference.  Message to scheduling has been sent.  

## 2022-12-09 LAB — KAPPA/LAMBDA LIGHT CHAINS
Kappa free light chain: 30.2 mg/L — ABNORMAL HIGH (ref 3.3–19.4)
Kappa, lambda light chain ratio: 2.14 — ABNORMAL HIGH (ref 0.26–1.65)
Lambda free light chains: 14.1 mg/L (ref 5.7–26.3)

## 2022-12-10 LAB — HAPTOGLOBIN: Haptoglobin: 234 mg/dL (ref 42–346)

## 2022-12-12 LAB — MULTIPLE MYELOMA PANEL, SERUM
Albumin SerPl Elph-Mcnc: 3.9 g/dL (ref 2.9–4.4)
Albumin/Glob SerPl: 1.2 (ref 0.7–1.7)
Alpha 1: 0.2 g/dL (ref 0.0–0.4)
Alpha2 Glob SerPl Elph-Mcnc: 0.8 g/dL (ref 0.4–1.0)
B-Globulin SerPl Elph-Mcnc: 1.3 g/dL (ref 0.7–1.3)
Gamma Glob SerPl Elph-Mcnc: 1.1 g/dL (ref 0.4–1.8)
Globulin, Total: 3.4 g/dL (ref 2.2–3.9)
IgA: 324 mg/dL (ref 64–422)
IgG (Immunoglobin G), Serum: 1216 mg/dL (ref 586–1602)
IgM (Immunoglobulin M), Srm: 21 mg/dL — ABNORMAL LOW (ref 26–217)
Total Protein ELP: 7.3 g/dL (ref 6.0–8.5)

## 2022-12-12 LAB — HGB FRACTIONATION CASCADE
Hgb A2: 4.5 % — ABNORMAL HIGH (ref 1.8–3.2)
Hgb A: 91.2 % — ABNORMAL LOW (ref 96.4–98.8)
Hgb F: 4.3 % — ABNORMAL HIGH (ref 0.0–2.0)
Hgb S: 0 %

## 2022-12-13 LAB — ZINC: Zinc: 73 ug/dL (ref 44–115)

## 2022-12-13 LAB — COPPER, SERUM: Copper: 142 ug/dL (ref 80–158)

## 2022-12-20 MED FILL — Iron Sucrose Inj 20 MG/ML (Fe Equiv): INTRAVENOUS | Qty: 10 | Status: AC

## 2022-12-21 ENCOUNTER — Inpatient Hospital Stay: Payer: Medicare HMO

## 2022-12-21 VITALS — BP 176/74 | HR 75 | Temp 99.1°F | Resp 18

## 2022-12-21 DIAGNOSIS — Z888 Allergy status to other drugs, medicaments and biological substances status: Secondary | ICD-10-CM | POA: Diagnosis not present

## 2022-12-21 DIAGNOSIS — Z8673 Personal history of transient ischemic attack (TIA), and cerebral infarction without residual deficits: Secondary | ICD-10-CM | POA: Diagnosis not present

## 2022-12-21 DIAGNOSIS — R69 Illness, unspecified: Secondary | ICD-10-CM | POA: Diagnosis not present

## 2022-12-21 DIAGNOSIS — Z885 Allergy status to narcotic agent status: Secondary | ICD-10-CM | POA: Diagnosis not present

## 2022-12-21 DIAGNOSIS — M791 Myalgia, unspecified site: Secondary | ICD-10-CM | POA: Diagnosis not present

## 2022-12-21 DIAGNOSIS — Z79899 Other long term (current) drug therapy: Secondary | ICD-10-CM | POA: Diagnosis not present

## 2022-12-21 DIAGNOSIS — Z8249 Family history of ischemic heart disease and other diseases of the circulatory system: Secondary | ICD-10-CM | POA: Diagnosis not present

## 2022-12-21 DIAGNOSIS — I739 Peripheral vascular disease, unspecified: Secondary | ICD-10-CM | POA: Diagnosis not present

## 2022-12-21 DIAGNOSIS — Z89612 Acquired absence of left leg above knee: Secondary | ICD-10-CM | POA: Diagnosis not present

## 2022-12-21 DIAGNOSIS — D539 Nutritional anemia, unspecified: Secondary | ICD-10-CM

## 2022-12-21 DIAGNOSIS — Z8042 Family history of malignant neoplasm of prostate: Secondary | ICD-10-CM | POA: Diagnosis not present

## 2022-12-21 DIAGNOSIS — Z803 Family history of malignant neoplasm of breast: Secondary | ICD-10-CM | POA: Diagnosis not present

## 2022-12-21 DIAGNOSIS — Z7901 Long term (current) use of anticoagulants: Secondary | ICD-10-CM | POA: Diagnosis not present

## 2022-12-21 DIAGNOSIS — Z886 Allergy status to analgesic agent status: Secondary | ICD-10-CM | POA: Diagnosis not present

## 2022-12-21 DIAGNOSIS — I10 Essential (primary) hypertension: Secondary | ICD-10-CM | POA: Diagnosis not present

## 2022-12-21 DIAGNOSIS — Z882 Allergy status to sulfonamides status: Secondary | ICD-10-CM | POA: Diagnosis not present

## 2022-12-21 DIAGNOSIS — K5730 Diverticulosis of large intestine without perforation or abscess without bleeding: Secondary | ICD-10-CM | POA: Diagnosis not present

## 2022-12-21 DIAGNOSIS — Z86718 Personal history of other venous thrombosis and embolism: Secondary | ICD-10-CM | POA: Diagnosis not present

## 2022-12-21 DIAGNOSIS — Z880 Allergy status to penicillin: Secondary | ICD-10-CM | POA: Diagnosis not present

## 2022-12-21 DIAGNOSIS — Z8719 Personal history of other diseases of the digestive system: Secondary | ICD-10-CM | POA: Diagnosis not present

## 2022-12-21 DIAGNOSIS — D649 Anemia, unspecified: Secondary | ICD-10-CM | POA: Diagnosis not present

## 2022-12-21 MED ORDER — SODIUM CHLORIDE 0.9 % IV SOLN
200.0000 mg | Freq: Once | INTRAVENOUS | Status: AC
  Administered 2022-12-21: 200 mg via INTRAVENOUS
  Filled 2022-12-21: qty 200

## 2022-12-21 MED ORDER — SODIUM CHLORIDE 0.9 % IV SOLN: Freq: Once | INTRAVENOUS | Status: AC

## 2022-12-21 MED ORDER — ACETAMINOPHEN 325 MG PO TABS
650.0000 mg | ORAL_TABLET | Freq: Once | ORAL | Status: AC
  Administered 2022-12-21: 650 mg via ORAL
  Filled 2022-12-21: qty 2

## 2022-12-21 NOTE — Patient Instructions (Signed)

## 2022-12-22 MED FILL — Iron Sucrose Inj 20 MG/ML (Fe Equiv): INTRAVENOUS | Qty: 10 | Status: AC

## 2022-12-23 ENCOUNTER — Inpatient Hospital Stay: Payer: Medicare HMO

## 2022-12-23 VITALS — BP 148/68 | HR 65 | Temp 98.7°F | Resp 14

## 2022-12-23 DIAGNOSIS — Z79899 Other long term (current) drug therapy: Secondary | ICD-10-CM | POA: Diagnosis not present

## 2022-12-23 DIAGNOSIS — Z803 Family history of malignant neoplasm of breast: Secondary | ICD-10-CM | POA: Diagnosis not present

## 2022-12-23 DIAGNOSIS — M791 Myalgia, unspecified site: Secondary | ICD-10-CM | POA: Diagnosis not present

## 2022-12-23 DIAGNOSIS — D649 Anemia, unspecified: Secondary | ICD-10-CM | POA: Diagnosis not present

## 2022-12-23 DIAGNOSIS — Z7901 Long term (current) use of anticoagulants: Secondary | ICD-10-CM | POA: Diagnosis not present

## 2022-12-23 DIAGNOSIS — Z888 Allergy status to other drugs, medicaments and biological substances status: Secondary | ICD-10-CM | POA: Diagnosis not present

## 2022-12-23 DIAGNOSIS — Z86718 Personal history of other venous thrombosis and embolism: Secondary | ICD-10-CM | POA: Diagnosis not present

## 2022-12-23 DIAGNOSIS — I739 Peripheral vascular disease, unspecified: Secondary | ICD-10-CM | POA: Diagnosis not present

## 2022-12-23 DIAGNOSIS — R69 Illness, unspecified: Secondary | ICD-10-CM | POA: Diagnosis not present

## 2022-12-23 DIAGNOSIS — D539 Nutritional anemia, unspecified: Secondary | ICD-10-CM

## 2022-12-23 DIAGNOSIS — Z8673 Personal history of transient ischemic attack (TIA), and cerebral infarction without residual deficits: Secondary | ICD-10-CM | POA: Diagnosis not present

## 2022-12-23 DIAGNOSIS — K5730 Diverticulosis of large intestine without perforation or abscess without bleeding: Secondary | ICD-10-CM | POA: Diagnosis not present

## 2022-12-23 DIAGNOSIS — Z8249 Family history of ischemic heart disease and other diseases of the circulatory system: Secondary | ICD-10-CM | POA: Diagnosis not present

## 2022-12-23 DIAGNOSIS — Z89612 Acquired absence of left leg above knee: Secondary | ICD-10-CM | POA: Diagnosis not present

## 2022-12-23 DIAGNOSIS — Z8719 Personal history of other diseases of the digestive system: Secondary | ICD-10-CM | POA: Diagnosis not present

## 2022-12-23 DIAGNOSIS — I10 Essential (primary) hypertension: Secondary | ICD-10-CM | POA: Diagnosis not present

## 2022-12-23 DIAGNOSIS — Z886 Allergy status to analgesic agent status: Secondary | ICD-10-CM | POA: Diagnosis not present

## 2022-12-23 DIAGNOSIS — Z880 Allergy status to penicillin: Secondary | ICD-10-CM | POA: Diagnosis not present

## 2022-12-23 DIAGNOSIS — Z8042 Family history of malignant neoplasm of prostate: Secondary | ICD-10-CM | POA: Diagnosis not present

## 2022-12-23 DIAGNOSIS — Z882 Allergy status to sulfonamides status: Secondary | ICD-10-CM | POA: Diagnosis not present

## 2022-12-23 DIAGNOSIS — Z885 Allergy status to narcotic agent status: Secondary | ICD-10-CM | POA: Diagnosis not present

## 2022-12-23 MED ORDER — SODIUM CHLORIDE 0.9 % IV SOLN: Freq: Once | INTRAVENOUS | Status: AC

## 2022-12-23 MED ORDER — ACETAMINOPHEN 325 MG PO TABS
650.0000 mg | ORAL_TABLET | Freq: Once | ORAL | Status: AC
  Administered 2022-12-23: 650 mg via ORAL
  Filled 2022-12-23: qty 2

## 2022-12-23 MED ORDER — SODIUM CHLORIDE 0.9 % IV SOLN
200.0000 mg | Freq: Once | INTRAVENOUS | Status: AC
  Administered 2022-12-23: 200 mg via INTRAVENOUS
  Filled 2022-12-23: qty 200

## 2022-12-23 NOTE — Patient Instructions (Signed)

## 2022-12-27 ENCOUNTER — Encounter: Payer: Self-pay | Admitting: Oncology

## 2022-12-27 ENCOUNTER — Other Ambulatory Visit: Payer: Self-pay

## 2022-12-27 DIAGNOSIS — E782 Mixed hyperlipidemia: Secondary | ICD-10-CM

## 2022-12-27 DIAGNOSIS — G43009 Migraine without aura, not intractable, without status migrainosus: Secondary | ICD-10-CM

## 2022-12-27 MED ORDER — BUTALBITAL-APAP-CAFFEINE 50-325-40 MG PO TABS: 1.0000 | ORAL_TABLET | Freq: Four times a day (QID) | ORAL | 2 refills | Status: DC | PRN

## 2022-12-27 MED ORDER — SIMVASTATIN 40 MG PO TABS: 40.0000 mg | ORAL_TABLET | Freq: Every day | ORAL | 0 refills | Status: DC

## 2022-12-27 MED FILL — Iron Sucrose Inj 20 MG/ML (Fe Equiv): INTRAVENOUS | Qty: 10 | Status: AC

## 2022-12-28 ENCOUNTER — Inpatient Hospital Stay: Payer: Medicare HMO | Attending: Oncology

## 2022-12-28 ENCOUNTER — Encounter: Payer: Self-pay | Admitting: Oncology

## 2022-12-28 VITALS — BP 144/74 | HR 69 | Temp 98.6°F | Resp 18

## 2022-12-28 DIAGNOSIS — Z86718 Personal history of other venous thrombosis and embolism: Secondary | ICD-10-CM | POA: Insufficient documentation

## 2022-12-28 DIAGNOSIS — F17210 Nicotine dependence, cigarettes, uncomplicated: Secondary | ICD-10-CM | POA: Insufficient documentation

## 2022-12-28 DIAGNOSIS — Z8249 Family history of ischemic heart disease and other diseases of the circulatory system: Secondary | ICD-10-CM | POA: Diagnosis not present

## 2022-12-28 DIAGNOSIS — R110 Nausea: Secondary | ICD-10-CM | POA: Diagnosis not present

## 2022-12-28 DIAGNOSIS — N920 Excessive and frequent menstruation with regular cycle: Secondary | ICD-10-CM | POA: Diagnosis not present

## 2022-12-28 DIAGNOSIS — Z8673 Personal history of transient ischemic attack (TIA), and cerebral infarction without residual deficits: Secondary | ICD-10-CM | POA: Diagnosis not present

## 2022-12-28 DIAGNOSIS — N183 Chronic kidney disease, stage 3 unspecified: Secondary | ICD-10-CM | POA: Diagnosis not present

## 2022-12-28 DIAGNOSIS — D539 Nutritional anemia, unspecified: Secondary | ICD-10-CM

## 2022-12-28 DIAGNOSIS — I739 Peripheral vascular disease, unspecified: Secondary | ICD-10-CM | POA: Insufficient documentation

## 2022-12-28 DIAGNOSIS — Z886 Allergy status to analgesic agent status: Secondary | ICD-10-CM | POA: Insufficient documentation

## 2022-12-28 DIAGNOSIS — D500 Iron deficiency anemia secondary to blood loss (chronic): Secondary | ICD-10-CM | POA: Diagnosis not present

## 2022-12-28 DIAGNOSIS — K5900 Constipation, unspecified: Secondary | ICD-10-CM | POA: Insufficient documentation

## 2022-12-28 DIAGNOSIS — Z803 Family history of malignant neoplasm of breast: Secondary | ICD-10-CM | POA: Diagnosis not present

## 2022-12-28 DIAGNOSIS — I129 Hypertensive chronic kidney disease with stage 1 through stage 4 chronic kidney disease, or unspecified chronic kidney disease: Secondary | ICD-10-CM | POA: Diagnosis not present

## 2022-12-28 DIAGNOSIS — Z888 Allergy status to other drugs, medicaments and biological substances status: Secondary | ICD-10-CM | POA: Insufficient documentation

## 2022-12-28 DIAGNOSIS — Z882 Allergy status to sulfonamides status: Secondary | ICD-10-CM | POA: Insufficient documentation

## 2022-12-28 DIAGNOSIS — Z8042 Family history of malignant neoplasm of prostate: Secondary | ICD-10-CM | POA: Diagnosis not present

## 2022-12-28 DIAGNOSIS — Z885 Allergy status to narcotic agent status: Secondary | ICD-10-CM | POA: Insufficient documentation

## 2022-12-28 DIAGNOSIS — Z89612 Acquired absence of left leg above knee: Secondary | ICD-10-CM | POA: Insufficient documentation

## 2022-12-28 DIAGNOSIS — R5383 Other fatigue: Secondary | ICD-10-CM | POA: Insufficient documentation

## 2022-12-28 DIAGNOSIS — Z7901 Long term (current) use of anticoagulants: Secondary | ICD-10-CM | POA: Insufficient documentation

## 2022-12-28 DIAGNOSIS — Z880 Allergy status to penicillin: Secondary | ICD-10-CM | POA: Insufficient documentation

## 2022-12-28 DIAGNOSIS — Z79899 Other long term (current) drug therapy: Secondary | ICD-10-CM | POA: Insufficient documentation

## 2022-12-28 DIAGNOSIS — K5730 Diverticulosis of large intestine without perforation or abscess without bleeding: Secondary | ICD-10-CM | POA: Insufficient documentation

## 2022-12-28 MED ORDER — ACETAMINOPHEN 325 MG PO TABS
650.0000 mg | ORAL_TABLET | Freq: Once | ORAL | Status: AC
  Administered 2022-12-28: 650 mg via ORAL
  Filled 2022-12-28: qty 2

## 2022-12-28 MED ORDER — SODIUM CHLORIDE 0.9 % IV SOLN
200.0000 mg | Freq: Once | INTRAVENOUS | Status: AC
  Administered 2022-12-28: 200 mg via INTRAVENOUS
  Filled 2022-12-28: qty 200

## 2022-12-28 MED ORDER — SODIUM CHLORIDE 0.9 % IV SOLN: Freq: Once | INTRAVENOUS | Status: AC

## 2022-12-28 NOTE — Patient Instructions (Signed)

## 2022-12-29 MED FILL — Iron Sucrose Inj 20 MG/ML (Fe Equiv): INTRAVENOUS | Qty: 10 | Status: AC

## 2022-12-30 ENCOUNTER — Inpatient Hospital Stay: Payer: Medicare HMO

## 2022-12-30 VITALS — BP 140/85 | HR 74 | Resp 16

## 2022-12-30 DIAGNOSIS — Z86718 Personal history of other venous thrombosis and embolism: Secondary | ICD-10-CM | POA: Diagnosis not present

## 2022-12-30 DIAGNOSIS — Z882 Allergy status to sulfonamides status: Secondary | ICD-10-CM | POA: Diagnosis not present

## 2022-12-30 DIAGNOSIS — N920 Excessive and frequent menstruation with regular cycle: Secondary | ICD-10-CM | POA: Diagnosis not present

## 2022-12-30 DIAGNOSIS — I129 Hypertensive chronic kidney disease with stage 1 through stage 4 chronic kidney disease, or unspecified chronic kidney disease: Secondary | ICD-10-CM | POA: Diagnosis not present

## 2022-12-30 DIAGNOSIS — K5900 Constipation, unspecified: Secondary | ICD-10-CM | POA: Diagnosis not present

## 2022-12-30 DIAGNOSIS — Z880 Allergy status to penicillin: Secondary | ICD-10-CM | POA: Diagnosis not present

## 2022-12-30 DIAGNOSIS — Z886 Allergy status to analgesic agent status: Secondary | ICD-10-CM | POA: Diagnosis not present

## 2022-12-30 DIAGNOSIS — I739 Peripheral vascular disease, unspecified: Secondary | ICD-10-CM | POA: Diagnosis not present

## 2022-12-30 DIAGNOSIS — Z885 Allergy status to narcotic agent status: Secondary | ICD-10-CM | POA: Diagnosis not present

## 2022-12-30 DIAGNOSIS — Z8673 Personal history of transient ischemic attack (TIA), and cerebral infarction without residual deficits: Secondary | ICD-10-CM | POA: Diagnosis not present

## 2022-12-30 DIAGNOSIS — R5383 Other fatigue: Secondary | ICD-10-CM | POA: Diagnosis not present

## 2022-12-30 DIAGNOSIS — R110 Nausea: Secondary | ICD-10-CM | POA: Diagnosis not present

## 2022-12-30 DIAGNOSIS — Z803 Family history of malignant neoplasm of breast: Secondary | ICD-10-CM | POA: Diagnosis not present

## 2022-12-30 DIAGNOSIS — F17210 Nicotine dependence, cigarettes, uncomplicated: Secondary | ICD-10-CM | POA: Diagnosis not present

## 2022-12-30 DIAGNOSIS — Z7901 Long term (current) use of anticoagulants: Secondary | ICD-10-CM | POA: Diagnosis not present

## 2022-12-30 DIAGNOSIS — Z8042 Family history of malignant neoplasm of prostate: Secondary | ICD-10-CM | POA: Diagnosis not present

## 2022-12-30 DIAGNOSIS — Z8249 Family history of ischemic heart disease and other diseases of the circulatory system: Secondary | ICD-10-CM | POA: Diagnosis not present

## 2022-12-30 DIAGNOSIS — N183 Chronic kidney disease, stage 3 unspecified: Secondary | ICD-10-CM | POA: Diagnosis not present

## 2022-12-30 DIAGNOSIS — K5730 Diverticulosis of large intestine without perforation or abscess without bleeding: Secondary | ICD-10-CM | POA: Diagnosis not present

## 2022-12-30 DIAGNOSIS — Z79899 Other long term (current) drug therapy: Secondary | ICD-10-CM | POA: Diagnosis not present

## 2022-12-30 DIAGNOSIS — D500 Iron deficiency anemia secondary to blood loss (chronic): Secondary | ICD-10-CM | POA: Diagnosis not present

## 2022-12-30 DIAGNOSIS — D539 Nutritional anemia, unspecified: Secondary | ICD-10-CM

## 2022-12-30 DIAGNOSIS — Z89612 Acquired absence of left leg above knee: Secondary | ICD-10-CM | POA: Diagnosis not present

## 2022-12-30 DIAGNOSIS — Z888 Allergy status to other drugs, medicaments and biological substances status: Secondary | ICD-10-CM | POA: Diagnosis not present

## 2022-12-30 MED ORDER — SODIUM CHLORIDE 0.9 % IV SOLN: Freq: Once | INTRAVENOUS | Status: AC

## 2022-12-30 MED ORDER — SODIUM CHLORIDE 0.9 % IV SOLN
200.0000 mg | Freq: Once | INTRAVENOUS | Status: AC
  Administered 2022-12-30: 200 mg via INTRAVENOUS
  Filled 2022-12-30: qty 200

## 2022-12-30 MED ORDER — ACETAMINOPHEN 325 MG PO TABS
650.0000 mg | ORAL_TABLET | Freq: Once | ORAL | Status: AC
  Administered 2022-12-30: 650 mg via ORAL
  Filled 2022-12-30: qty 2

## 2022-12-30 NOTE — Patient Instructions (Signed)

## 2022-12-30 NOTE — Progress Notes (Signed)
Pt declined to stay the 30 mins wait time. Vitals stable at d/c

## 2023-01-01 NOTE — Telephone Encounter (Signed)
error 

## 2023-01-03 MED FILL — Iron Sucrose Inj 20 MG/ML (Fe Equiv): INTRAVENOUS | Qty: 10 | Status: AC

## 2023-01-04 ENCOUNTER — Inpatient Hospital Stay: Payer: Medicare HMO

## 2023-01-04 VITALS — BP 135/74 | HR 77 | Temp 98.0°F | Resp 16

## 2023-01-04 DIAGNOSIS — Z86718 Personal history of other venous thrombosis and embolism: Secondary | ICD-10-CM | POA: Diagnosis not present

## 2023-01-04 DIAGNOSIS — Z803 Family history of malignant neoplasm of breast: Secondary | ICD-10-CM | POA: Diagnosis not present

## 2023-01-04 DIAGNOSIS — Z882 Allergy status to sulfonamides status: Secondary | ICD-10-CM | POA: Diagnosis not present

## 2023-01-04 DIAGNOSIS — Z7901 Long term (current) use of anticoagulants: Secondary | ICD-10-CM | POA: Diagnosis not present

## 2023-01-04 DIAGNOSIS — Z79899 Other long term (current) drug therapy: Secondary | ICD-10-CM | POA: Diagnosis not present

## 2023-01-04 DIAGNOSIS — R5383 Other fatigue: Secondary | ICD-10-CM | POA: Diagnosis not present

## 2023-01-04 DIAGNOSIS — N920 Excessive and frequent menstruation with regular cycle: Secondary | ICD-10-CM | POA: Diagnosis not present

## 2023-01-04 DIAGNOSIS — Z880 Allergy status to penicillin: Secondary | ICD-10-CM | POA: Diagnosis not present

## 2023-01-04 DIAGNOSIS — D500 Iron deficiency anemia secondary to blood loss (chronic): Secondary | ICD-10-CM | POA: Diagnosis not present

## 2023-01-04 DIAGNOSIS — F17210 Nicotine dependence, cigarettes, uncomplicated: Secondary | ICD-10-CM | POA: Diagnosis not present

## 2023-01-04 DIAGNOSIS — I739 Peripheral vascular disease, unspecified: Secondary | ICD-10-CM | POA: Diagnosis not present

## 2023-01-04 DIAGNOSIS — Z885 Allergy status to narcotic agent status: Secondary | ICD-10-CM | POA: Diagnosis not present

## 2023-01-04 DIAGNOSIS — Z888 Allergy status to other drugs, medicaments and biological substances status: Secondary | ICD-10-CM | POA: Diagnosis not present

## 2023-01-04 DIAGNOSIS — Z89612 Acquired absence of left leg above knee: Secondary | ICD-10-CM | POA: Diagnosis not present

## 2023-01-04 DIAGNOSIS — Z8249 Family history of ischemic heart disease and other diseases of the circulatory system: Secondary | ICD-10-CM | POA: Diagnosis not present

## 2023-01-04 DIAGNOSIS — N183 Chronic kidney disease, stage 3 unspecified: Secondary | ICD-10-CM | POA: Diagnosis not present

## 2023-01-04 DIAGNOSIS — D539 Nutritional anemia, unspecified: Secondary | ICD-10-CM

## 2023-01-04 DIAGNOSIS — Z8042 Family history of malignant neoplasm of prostate: Secondary | ICD-10-CM | POA: Diagnosis not present

## 2023-01-04 DIAGNOSIS — R110 Nausea: Secondary | ICD-10-CM | POA: Diagnosis not present

## 2023-01-04 DIAGNOSIS — Z8673 Personal history of transient ischemic attack (TIA), and cerebral infarction without residual deficits: Secondary | ICD-10-CM | POA: Diagnosis not present

## 2023-01-04 DIAGNOSIS — I129 Hypertensive chronic kidney disease with stage 1 through stage 4 chronic kidney disease, or unspecified chronic kidney disease: Secondary | ICD-10-CM | POA: Diagnosis not present

## 2023-01-04 DIAGNOSIS — K5730 Diverticulosis of large intestine without perforation or abscess without bleeding: Secondary | ICD-10-CM | POA: Diagnosis not present

## 2023-01-04 DIAGNOSIS — K5900 Constipation, unspecified: Secondary | ICD-10-CM | POA: Diagnosis not present

## 2023-01-04 DIAGNOSIS — Z886 Allergy status to analgesic agent status: Secondary | ICD-10-CM | POA: Diagnosis not present

## 2023-01-04 MED ORDER — SODIUM CHLORIDE 0.9 % IV SOLN: Freq: Once | INTRAVENOUS | Status: AC

## 2023-01-04 MED ORDER — SODIUM CHLORIDE 0.9 % IV SOLN
200.0000 mg | Freq: Once | INTRAVENOUS | Status: AC
  Administered 2023-01-04: 200 mg via INTRAVENOUS
  Filled 2023-01-04: qty 200

## 2023-01-04 MED ORDER — ACETAMINOPHEN 325 MG PO TABS
650.0000 mg | ORAL_TABLET | Freq: Once | ORAL | Status: AC
  Administered 2023-01-04: 650 mg via ORAL
  Filled 2023-01-04: qty 2

## 2023-01-05 ENCOUNTER — Inpatient Hospital Stay (INDEPENDENT_AMBULATORY_CARE_PROVIDER_SITE_OTHER): Payer: Medicare HMO | Admitting: Oncology

## 2023-01-05 ENCOUNTER — Other Ambulatory Visit: Payer: Self-pay

## 2023-01-05 ENCOUNTER — Inpatient Hospital Stay: Payer: Medicare HMO

## 2023-01-05 VITALS — BP 139/76 | HR 66 | Temp 98.5°F | Resp 18

## 2023-01-05 DIAGNOSIS — D539 Nutritional anemia, unspecified: Secondary | ICD-10-CM

## 2023-01-05 DIAGNOSIS — Z7901 Long term (current) use of anticoagulants: Secondary | ICD-10-CM | POA: Diagnosis not present

## 2023-01-05 DIAGNOSIS — I639 Cerebral infarction, unspecified: Secondary | ICD-10-CM

## 2023-01-05 DIAGNOSIS — Z8042 Family history of malignant neoplasm of prostate: Secondary | ICD-10-CM | POA: Diagnosis not present

## 2023-01-05 DIAGNOSIS — N183 Chronic kidney disease, stage 3 unspecified: Secondary | ICD-10-CM | POA: Diagnosis not present

## 2023-01-05 DIAGNOSIS — I739 Peripheral vascular disease, unspecified: Secondary | ICD-10-CM | POA: Diagnosis not present

## 2023-01-05 DIAGNOSIS — Z885 Allergy status to narcotic agent status: Secondary | ICD-10-CM | POA: Diagnosis not present

## 2023-01-05 DIAGNOSIS — Z8673 Personal history of transient ischemic attack (TIA), and cerebral infarction without residual deficits: Secondary | ICD-10-CM | POA: Diagnosis not present

## 2023-01-05 DIAGNOSIS — Z89612 Acquired absence of left leg above knee: Secondary | ICD-10-CM | POA: Diagnosis not present

## 2023-01-05 DIAGNOSIS — Z8249 Family history of ischemic heart disease and other diseases of the circulatory system: Secondary | ICD-10-CM | POA: Diagnosis not present

## 2023-01-05 DIAGNOSIS — Z880 Allergy status to penicillin: Secondary | ICD-10-CM | POA: Diagnosis not present

## 2023-01-05 DIAGNOSIS — R110 Nausea: Secondary | ICD-10-CM | POA: Diagnosis not present

## 2023-01-05 DIAGNOSIS — Z809 Family history of malignant neoplasm, unspecified: Secondary | ICD-10-CM | POA: Diagnosis not present

## 2023-01-05 DIAGNOSIS — Z79899 Other long term (current) drug therapy: Secondary | ICD-10-CM | POA: Diagnosis not present

## 2023-01-05 DIAGNOSIS — Z803 Family history of malignant neoplasm of breast: Secondary | ICD-10-CM | POA: Diagnosis not present

## 2023-01-05 DIAGNOSIS — Z886 Allergy status to analgesic agent status: Secondary | ICD-10-CM | POA: Diagnosis not present

## 2023-01-05 DIAGNOSIS — R5383 Other fatigue: Secondary | ICD-10-CM | POA: Diagnosis not present

## 2023-01-05 DIAGNOSIS — N920 Excessive and frequent menstruation with regular cycle: Secondary | ICD-10-CM | POA: Diagnosis not present

## 2023-01-05 DIAGNOSIS — Z720 Tobacco use: Secondary | ICD-10-CM | POA: Diagnosis not present

## 2023-01-05 DIAGNOSIS — I129 Hypertensive chronic kidney disease with stage 1 through stage 4 chronic kidney disease, or unspecified chronic kidney disease: Secondary | ICD-10-CM | POA: Diagnosis not present

## 2023-01-05 DIAGNOSIS — I10 Essential (primary) hypertension: Secondary | ICD-10-CM

## 2023-01-05 DIAGNOSIS — Z882 Allergy status to sulfonamides status: Secondary | ICD-10-CM | POA: Diagnosis not present

## 2023-01-05 DIAGNOSIS — F17210 Nicotine dependence, cigarettes, uncomplicated: Secondary | ICD-10-CM | POA: Diagnosis not present

## 2023-01-05 DIAGNOSIS — Z86718 Personal history of other venous thrombosis and embolism: Secondary | ICD-10-CM | POA: Diagnosis not present

## 2023-01-05 DIAGNOSIS — K5900 Constipation, unspecified: Secondary | ICD-10-CM | POA: Diagnosis not present

## 2023-01-05 DIAGNOSIS — K5730 Diverticulosis of large intestine without perforation or abscess without bleeding: Secondary | ICD-10-CM | POA: Diagnosis not present

## 2023-01-05 DIAGNOSIS — Z888 Allergy status to other drugs, medicaments and biological substances status: Secondary | ICD-10-CM | POA: Diagnosis not present

## 2023-01-05 DIAGNOSIS — D500 Iron deficiency anemia secondary to blood loss (chronic): Secondary | ICD-10-CM | POA: Diagnosis not present

## 2023-01-05 LAB — CBC WITH DIFFERENTIAL/PLATELET
Abs Immature Granulocytes: 0.09 10*3/uL — ABNORMAL HIGH (ref 0.00–0.07)
Basophils Absolute: 0 10*3/uL (ref 0.0–0.1)
Basophils Relative: 1 %
Eosinophils Absolute: 0.4 10*3/uL (ref 0.0–0.5)
Eosinophils Relative: 6 %
HCT: 36.7 % (ref 36.0–46.0)
Hemoglobin: 11.5 g/dL — ABNORMAL LOW (ref 12.0–15.0)
Immature Granulocytes: 1 %
Lymphocytes Relative: 27 %
Lymphs Abs: 1.9 10*3/uL (ref 0.7–4.0)
MCH: 25.4 pg — ABNORMAL LOW (ref 26.0–34.0)
MCHC: 31.3 g/dL (ref 30.0–36.0)
MCV: 81.2 fL (ref 80.0–100.0)
Monocytes Absolute: 1.1 10*3/uL — ABNORMAL HIGH (ref 0.1–1.0)
Monocytes Relative: 16 %
Neutro Abs: 3.5 10*3/uL (ref 1.7–7.7)
Neutrophils Relative %: 49 %
Platelets: 455 10*3/uL — ABNORMAL HIGH (ref 150–400)
RBC: 4.52 MIL/uL (ref 3.87–5.11)
RDW: 16.1 % — ABNORMAL HIGH (ref 11.5–15.5)
WBC: 7.1 10*3/uL (ref 4.0–10.5)
nRBC: 0 % (ref 0.0–0.2)

## 2023-01-05 LAB — RETICULOCYTES
Immature Retic Fract: 31.5 % — ABNORMAL HIGH (ref 2.3–15.9)
RBC.: 4.54 MIL/uL (ref 3.87–5.11)
Retic Count, Absolute: 102.6 10*3/uL (ref 19.0–186.0)
Retic Ct Pct: 2.3 % (ref 0.4–3.1)

## 2023-01-05 MED ORDER — NIFEDIPINE ER OSMOTIC RELEASE 30 MG PO TB24
30.0000 mg | ORAL_TABLET | Freq: Every day | ORAL | 1 refills | Status: DC
Start: 2023-01-05 — End: 2023-01-09

## 2023-01-05 NOTE — Progress Notes (Signed)
[facility] Cancer Follow up Visit:  Patient Care Team: Blane Ohara, MD as PCP - General (Family Medicine) Zettie Pho, [employer] (Pharmacist)  CHIEF COMPLAINTS/PURPOSE OF CONSULTATION:   HISTORY OF PRESENTING ILLNESS: Anna Hayes 72 y.o. female is here because of anemia Medical history notable for lower extremity DVT, heart murmur, hypertension, myalgias, palpitations, septic thrombophlebitis, CVA, left AKA due to peripheral vascular disease with resultant bilateral femoral artery embolus, fasciotomy, chronic anticoagulation with Xarelto  December 06, 2021: Colonoscopy demonstrated 10 right colon polyps and 9 left colon polyps 0.2 to 0.7 cm in diameter which were removed.  A few diverticula in right colon.  Follow-up colonoscopy recommended in 1 year  October 13, 2022:: WBC 6.8 hemoglobin 10.8 MCV 79 platelet count 474; 51 segs 33 lymphs 12 monos 3 eos 1 basophil Ferritin 32 CMP notable for CO2 of 18  December 08, 2022: [facility] Hematology Consult  Patient is on Xarelto because of prior history of CVA and PVD.  She is not taking oral iron.  Has never received PRBC's nor IV iron. Has not yet been called by GI for follow up colonoscopy.  No history of EGD.  Takes tylenol for pain but no NSAIDS.   No pica to ice, uncooked starches Does not want to see a geneticist regarding family history of cancer.    Social:  Widowed.  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  Tobacco claims 1/2 ppd but has smoked up to 2 ppd.  EtOH none  [facility] Mother died 71 breast cancer Father died 94 prostate cancer Sister alive 86 HTN Sister alive 23's HTN Brother died 106 prostate cancer  WBC 7.5 hemoglobin 11.3 MCV 81 platelet count 490; 49 segs 32 lymphs 12 monos 6 eos 1 basophil.  Reticulocyte count 1.4%  Hemoglobin electrophoresis showed 91.2% hemoglobin A hemoglobin 8 to 4.5% hemoglobin F 4.3% felt to be consistent with beta thalassemia minor.   INR 1.3 PTT 32 fibrinogen  518 Coombs test negative.  Haptoglobin 234 SPEP with IEP showed no paraprotein.  Serum free kappa 30.2 lambda 14.1 with a kappa lambda 2.14 IgG 1216 IgA 324 IgM 21 Ferritin 13 folate 21.6 B12 488 Copper 142 zinc 73 CMP notable for sodium 133 glucose 107  December 21, 2022 through January 04, 2023: Received total of 1000 mg of Venofer  January 05, 2023: Scheduled follow-up for anemia.  Reviewed results of labs with patient and daughter.  Still fatigued.  Tolerated IV iron well.     Review of Systems  Constitutional:  Negative for appetite change, chills, fatigue and fever.       Has been gaining weight  HENT:   Negative for mouth sores, nosebleeds, sore throat and trouble swallowing.   Eyes:  Negative for eye problems and icterus.       Vision changes:  None  Respiratory:  Negative for chest tightness, cough, hemoptysis, shortness of breath and wheezing.        PND:  none Orthopnea:  none DOE:    Cardiovascular:  Negative for chest pain, leg swelling and palpitations.       PND:  none Orthopnea:  none  Gastrointestinal:  Positive for constipation and nausea. Negative for abdominal pain, blood in stool, diarrhea and vomiting.  Endocrine: Negative for hot flashes.       Cold intolerance:  none Heat intolerance:  none  Genitourinary:  Negative for bladder incontinence, difficulty urinating, dysuria, frequency, hematuria and nocturia.   Musculoskeletal:  Positive for  gait problem. Negative for arthralgias, back pain, myalgias, neck pain and neck stiffness.  Skin:  Negative for itching, rash and wound.  Neurological:  Positive for gait problem and headaches. Negative for dizziness, extremity weakness, light-headedness, numbness, seizures and speech difficulty.  Hematological:  Negative for adenopathy. Does not bruise/bleed easily.  Psychiatric/Behavioral:  Negative for suicidal ideas. The patient is not nervous/anxious.     MEDICAL HISTORY: Past Medical History:  Diagnosis Date   Anemia     Constipation    DVT of lower extremity (deep venous thrombosis)    Frequent headaches    Heart murmur    found during pregnancy -  no problems   Hypertension    Muscle pain    Palpitations    Septic thrombophlebitis 08/31/2017   Sinus problem    Stroke     SURGICAL HISTORY: Past Surgical History:  Procedure Laterality Date   AMPUTATION Left 07/05/2017   Procedure: LEFT ABOVE KNEE AMPUTATION;  Surgeon: Fransisco Hertz, MD;  Location: [facility] OR;  Service: Vascular;  Laterality: Left;   CATARACT EXTRACTION W/PHACO Right 06/12/2018   Procedure: CATARACT EXTRACTION WITH INTRAOCULAR LENS IMPLANT;  Surgeon: Carmela Rima, MD;  Location: [facility] OR;  Service: Ophthalmology;  Laterality: Right;   COLONOSCOPY  11/26/2015   Dr Jennye Boroughs - follow-up in 5 years   EMBOLECTOMY Bilateral 06/27/2017   Procedure: BILATERAL FEMORAL POPLITEAL EMBOLECTOMY;  Surgeon: Nada Libman, MD;  Location: [facility] OR;  Service: Vascular;  Laterality: Bilateral;   FASCIOTOMY Left 06/27/2017   Procedure: FULL COMPARTMENT LEFT LOWER LEG FASCIOTOMY;  Surgeon: Nada Libman, MD;  Location: [facility] OR;  Service: Vascular;  Laterality: Left;   PARTIAL HYSTERECTOMY     PATCH ANGIOPLASTY Left 06/27/2017   Procedure: PATCH ANGIOPLASTY Left Posterior Tibial Artery;  Surgeon: Nada Libman, MD;  Location: [facility] OR;  Service: Vascular;  Laterality: Left;   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 07/11/2017   Procedure: REPAIR OF COMPLEX TRACTION RETINAL DETACHMENT WITH ENDO LASER AND ANTIFUNGAL INJECTION;  Surgeon: Carmela Rima, MD;  [facility];  Service: Ophthalmology;  Laterality: Right;   TEE WITHOUT CARDIOVERSION N/A 07/11/2017   Procedure: TRANSESOPHAGEAL ECHOCARDIOGRAM (TEE);  Surgeon: Elease Hashimoto Deloris Ping, MD;  Location: [facility] ENDOSCOPY;  Service: Cardiovascular;  Laterality: N/A;    SOCIAL HISTORY: Social History   Socioeconomic History   Marital status: Widowed    Spouse name: Not on file   Number of  children: Not on file   Years of education: Not on file   Highest education level: Not on file  Occupational History   Not on file  Tobacco Use   Smoking status: Some Days    Packs/day: 0.25    Years: 53.00    Additional pack years: 0.00    Total pack years: 13.25    Types: Cigarettes   Smokeless tobacco: Never  Vaping Use   Vaping Use: Never used  Substance and Sexual Activity   Alcohol use: No   Drug use: No   Sexual activity: Not Currently  Other Topics Concern   Not on file  Social History Narrative   Lives with her daughter   Social Determinants of Health   Financial Resource Strain: Low Risk  (04/13/2022)   Overall Financial Resource Strain (CARDIA)    Difficulty of Paying Living Expenses: Not hard at all  Food Insecurity: No Food Insecurity (07/23/2021)   Hunger Vital Sign    Worried About Running Out of Food in the Last Year: Never true  Ran Out of Food in the Last Year: Never true  Transportation Needs: No Transportation Needs (04/13/2022)   PRAPARE - Administrator, Civil Service (Medical): No    Lack of Transportation (Non-Medical): No  Physical Activity: Insufficiently Active (06/29/2022)   Exercise Vital Sign    Days of Exercise per Week: 1 day    Minutes of Exercise per Session: 60 min  Stress: No Stress Concern Present (06/29/2022)   [employer] of Occupational Health - Occupational Stress Questionnaire    Feeling of Stress : Not at all  Social Connections: Moderately Isolated (06/29/2022)   Social Connection and Isolation Panel [NHANES]    Frequency of Communication with Friends and Family: More than three times a week    Frequency of Social Gatherings with Friends and Family: More than three times a week    Attends Religious Services: 1 to 4 times per year    Active Member of [employer] or Organizations: No    Attends Banker Meetings: Never    Marital Status: Widowed  Intimate Partner Violence: Not At Risk (06/29/2022)    Humiliation, Afraid, Rape, and Kick questionnaire    Fear of Current or Ex-Partner: No    Emotionally Abused: No    Physically Abused: No    Sexually Abused: No    FAMILY HISTORY Family History  Problem Relation Age of Onset   Breast cancer Mother    Prostate cancer Father    Prostate cancer Brother     ALLERGIES:  is allergic to latex, codeine, lyrica [pregabalin], nurtec [rimegepant sulfate], penicillins, amlodipine, aspirin, and sulfa antibiotics.  MEDICATIONS:  Current Outpatient Medications  Medication Sig Dispense Refill   atenolol (TENORMIN) 50 MG tablet TAKE 1 TABLET BY MOUTH EVERY DAY 90 tablet 1   butalbital-acetaminophen-caffeine (FIORICET) 50-325-40 MG tablet Take 1 tablet by mouth every 6 (six) hours as needed for migraine. 30 tablet 2   cyanocobalamin (,VITAMIN B-12,) 1000 MCG/ML injection Inject into the muscle.     fluticasone (FLONASE) 50 MCG/ACT nasal spray SPRAY 2 SPRAYS INTO EACH NOSTRIL EVERY DAY 48 mL 2   furosemide (LASIX) 20 MG tablet Take 20 mg by mouth daily as needed for fluid.      levothyroxine (SYNTHROID) 50 MCG tablet Take 1 tablet (50 mcg total) by mouth daily. 90 tablet 3   linaclotide (LINZESS) 145 MCG CAPS capsule TAKE 1 CAPSULE BY MOUTH EVERY DAY BEFORE BREAKFAST 90 capsule 1   lisinopril (ZESTRIL) 20 MG tablet TAKE 1 TABLET BY MOUTH EVERY DAY 90 tablet 3   NIFEdipine (PROCARDIA XL) 30 MG 24 hr tablet Take 1 tablet (30 mg total) by mouth daily. 90 tablet 1   omega-3 acid ethyl esters (LOVAZA) 1 g capsule TAKE 2 CAPSULES BY MOUTH 2 TIMES DAILY. 360 capsule 1   omeprazole (PRILOSEC) 40 MG capsule Take 1 capsule (40 mg total) by mouth daily. 90 capsule 1   ondansetron (ZOFRAN) 4 MG tablet Take 1 tablet (4 mg total) by mouth every 8 (eight) hours as needed for nausea or vomiting. 90 tablet 1   rivaroxaban (XARELTO) 20 MG TABS tablet Take 1 tablet (20 mg total) by mouth daily with supper. 90 tablet 1   simvastatin (ZOCOR) 40 MG tablet Take 1 tablet (40  mg total) by mouth daily. 90 tablet 0   Ubrogepant (UBRELVY) 100 MG TABS Take 100 mg by mouth once as needed for up to 1 dose (migraine headache). 2 tablet 0   No current facility-administered medications  for this visit.    PHYSICAL EXAMINATION:  ECOG PERFORMANCE STATUS: 1 - Symptomatic but completely ambulatory   There were no vitals filed for this visit.   There were no vitals filed for this visit.    Physical Exam Vitals and nursing note reviewed.  Constitutional:      General: She is not in acute distress.    Appearance: Normal appearance. She is obese. She is not toxic-appearing or diaphoretic.     Comments: Here with daughter.  Seated in wheelchair  HENT:     Head: Normocephalic and atraumatic.     Right Ear: External ear normal.     Left Ear: External ear normal.     Nose: Nose normal. No congestion or rhinorrhea.  Eyes:     General: No scleral icterus.    Extraocular Movements: Extraocular movements intact.     Conjunctiva/sclera: Conjunctivae normal.     Pupils: Pupils are equal, round, and reactive to light.  Cardiovascular:     Rate and Rhythm: Normal rate.     Heart sounds: No murmur heard.    No friction rub. No gallop.  Abdominal:     General: Bowel sounds are normal.     Palpations: Abdomen is soft.  Musculoskeletal:        General: No swelling or tenderness.     Cervical back: Normal range of motion and neck supple. No rigidity or tenderness.     Comments: Left AKA  Lymphadenopathy:     Head:     Right side of head: No submental, submandibular, tonsillar, preauricular, posterior auricular or occipital adenopathy.     Left side of head: No submental, submandibular, tonsillar, preauricular, posterior auricular or occipital adenopathy.     Cervical: No cervical adenopathy.     Right cervical: No superficial, deep or posterior cervical adenopathy.    Left cervical: No superficial, deep or posterior cervical adenopathy.     Upper Body:     Right upper  body: No supraclavicular, axillary, pectoral or epitrochlear adenopathy.     Left upper body: No supraclavicular, axillary, pectoral or epitrochlear adenopathy.  Skin:    General: Skin is warm.     Coloration: Skin is not jaundiced.  Neurological:     General: No focal deficit present.     Mental Status: She is alert and oriented to person, place, and time.     Cranial Nerves: No cranial nerve deficit.     Motor: Weakness present.     Comments: Left hemiparesis  Psychiatric:        Mood and Affect: Mood normal.        Behavior: Behavior normal.        Thought Content: Thought content normal.        Judgment: Judgment normal.     LABORATORY DATA: I have personally reviewed the data as listed:  Appointment on 12/08/2022  Component Date Value Ref Range Status   Prothrombin Time 12/08/2022 15.9 (H)  11.4 - 15.2 seconds Final   INR 12/08/2022 1.3 (H)  0.8 - 1.2 Final   Comment: (NOTE) INR goal varies based on device and disease states. Performed at [facility], [address], [place], [place] [address]    aPTT 12/08/2022 32  24 - 36 seconds Final   Performed at [facility], [address] [place], [place] [address]   Fibrinogen 12/08/2022 518 (H)  210 - 475 mg/dL Final   Comment: (NOTE) Fibrinogen results may be underestimated  in patients receiving thrombolytic therapy. Performed at [facility], [address] [place], [place] [address]    DAT, complement 12/08/2022 NEG   Final   DAT, IgG 12/08/2022    Final                   Value:NEG Performed at [facility], [address] [place], [place] [address]    Copper 12/08/2022 142  80 - 158 ug/dL Final   Comment: (NOTE) This test was developed and its performance characteristics determined by [facility]. It has not been cleared or approved by the Food and Drug Administration.                                Detection Limit = 5 Performed At: [facility] [place] [address] [place], [place] [id number] Jolene SchimkeNagendra Sanjai MD WG:[id number]    Hgb F 12/08/2022 4.3 (H)  0.0 - 2.0 % Final   Hgb A 12/08/2022 91.2 (L)  96.4 - 98.8 % Final   Hgb A2 12/08/2022 4.5 (H)  1.8 - 3.2 % Final   Hgb S 12/08/2022 0.0  0.0 % Final   Interpretation, Hgb Fract 12/08/2022 Comment   Final   Comment: (NOTE) Hemoglobin pattern and concentrations are consistent with beta- Thalassemia minor. Suggest hematologic and clinical correlation. Performed At: [facility] [place] [address] [place], [place] [id number] Jolene SchimkeNagendra Sanjai MD MW:[id number]    IgG (Immunoglobin G), Serum 12/08/2022 1,216  586 - 1,602 mg/dL Final   IgA 72/53/664403/14/2024 324  64 - 422 mg/dL Final   IgM (Immunoglobulin M), Srm 12/08/2022 21 (L)  26 - 217 mg/dL Final   Result confirmed on concentration.   Total Protein ELP 12/08/2022 7.3  6.0 - 8.5 g/dL Corrected   Albumin SerPl Elph-Mcnc 12/08/2022 3.9  2.9 - 4.4 g/dL Corrected   Alpha 1 03/47/425903/14/2024 0.2  0.0 - 0.4 g/dL Corrected   Alpha2 Glob SerPl Elph-Mcnc 12/08/2022 0.8  0.4 - 1.0 g/dL Corrected   B-Globulin SerPl Elph-Mcnc 12/08/2022 1.3  0.7 - 1.3 g/dL Corrected   Gamma Glob SerPl Elph-Mcnc 12/08/2022 1.1  0.4 - 1.8 g/dL Corrected   M Protein SerPl Elph-Mcnc 12/08/2022 Not Observed  Not Observed g/dL Corrected   Globulin, Total 12/08/2022 3.4  2.2 - 3.9 g/dL Corrected   Albumin/Glob SerPl 12/08/2022 1.2  0.7 - 1.7 Corrected   IFE 1 12/08/2022 Comment   Corrected   Comment: (NOTE) The immunofixation pattern appears unremarkable. Evidence of monoclonal protein is not apparent.    Please Note 12/08/2022 Comment   Corrected   Comment: (NOTE) Protein electrophoresis scan will follow via computer, mail, or courier delivery. Performed At: [facility] [place] [address] [place], [place] [id number] Jolene SchimkeNagendra Sanjai MD PI:[id number]    Kappa free light chain 12/08/2022 30.2 (H)  3.3 - 19.4 mg/L Final   Lambda free light chains  12/08/2022 14.1  5.7 - 26.3 mg/L Final   Kappa, lambda light chain ratio 12/08/2022 2.14 (H)  0.26 - 1.65 Final   Comment: (NOTE) Performed At: [facility] [place] [address] [place], [place] [id number] Jolene SchimkeNagendra Sanjai MD NA:[phone]    Retic Ct Pct 12/08/2022 1.4  0.4 - 3.1 % Final   RBC. 12/08/2022 4.51  3.87 - 5.11 MIL/uL Final   Retic Count, Absolute 12/08/2022 64.0  19.0 - 186.0 K/uL Final   Immature Retic Fract 12/08/2022 23.0 (H)  2.3 - 15.9 % Final  Performed at [facility], [address], [place], [place] [address]   Vitamin B-12 12/08/2022 488  180 - 914 pg/mL Final   Comment: (NOTE) This assay is not validated for testing neonatal or myeloproliferative syndrome specimens for Vitamin B12 levels. Performed at [facility], [address], [place], [place] [address]    Zinc 12/08/2022 73  44 - 115 ug/dL Final   Comment: (NOTE) This test was developed and its performance characteristics determined by [facility]. It has not been cleared or approved by the Food and Drug Administration.                                Detection Limit = 5 Performed At: [facility] [address] [place], [place] [id number] Jolene Schimke MD EL:[id number]    Haptoglobin 12/08/2022 234  42 - 346 mg/dL Final   Comment: (NOTE) Performed At: [facility] [address] [place], [place] [id number] Jolene Schimke MD GB:[id number]    Folate 12/08/2022 21.6  >5.9 ng/mL Final   Performed at [facility], [address] [place], [place] [address]   Ferritin 12/08/2022 13  11 - 307 ng/mL Final   Performed at [facility], [address], [place], [place] [address]  Office Visit on 12/08/2022  Component Date Value Ref Range Status   WBC 12/08/2022 7.5  4.0 - 10.5 K/uL Final   RBC 12/08/2022 4.54  3.87 - 5.11 MIL/uL Final   Hemoglobin 12/08/2022 11.3 (L)  12.0 - 15.0 g/dL Final   HCT 49/75/3005 36.8   36.0 - 46.0 % Final   MCV 12/08/2022 81.1  80.0 - 100.0 fL Final   MCH 12/08/2022 24.9 (L)  26.0 - 34.0 pg Final   MCHC 12/08/2022 30.7  30.0 - 36.0 g/dL Final   RDW 07/28/1116 15.2  11.5 - 15.5 % Final   Platelets 12/08/2022 490 (H)  150 - 400 K/uL Final   nRBC 12/08/2022 0.0  0.0 - 0.2 % Final   Neutrophils Relative % 12/08/2022 49  % Final   Neutro Abs 12/08/2022 3.7  1.7 - 7.7 K/uL Final   Lymphocytes Relative 12/08/2022 32  % Final   Lymphs Abs 12/08/2022 2.4  0.7 - 4.0 K/uL Final   Monocytes Relative 12/08/2022 12  % Final   Monocytes Absolute 12/08/2022 0.9  0.1 - 1.0 K/uL Final   Eosinophils Relative 12/08/2022 6  % Final   Eosinophils Absolute 12/08/2022 0.4  0.0 - 0.5 K/uL Final   Basophils Relative 12/08/2022 1  % Final   Basophils Absolute 12/08/2022 0.1  0.0 - 0.1 K/uL Final   Immature Granulocytes 12/08/2022 0  % Final   Abs Immature Granulocytes 12/08/2022 0.02  0.00 - 0.07 K/uL Final   Performed at [facility], [address], [place], [place] [address]   Sodium 12/08/2022 133 (L)  135 - 145 mmol/L Final   Potassium 12/08/2022 4.6  3.5 - 5.1 mmol/L Final   Chloride 12/08/2022 99  98 - 111 mmol/L Final   CO2 12/08/2022 25  22 - 32 mmol/L Final   Glucose, Bld 12/08/2022 107 (H)  70 - 99 mg/dL Final   Glucose reference range applies only to samples taken after fasting for at least 8 hours.   BUN 12/08/2022 18  8 - 23 mg/dL Final   Creatinine, Ser 12/08/2022 0.99  0.44 - 1.00 mg/dL Final   Calcium  12/08/2022 9.2  8.9 - 10.3 mg/dL Final   Total Protein 60/06/9322 8.2 (H)  6.5 - 8.1 g/dL Final   Albumin 55/73/2202 4.2  3.5 - 5.0 g/dL Final   AST 54/27/0623 21  15 - 41 U/L Final   ALT 12/08/2022 21  0 - 44 U/L Final   Alkaline Phosphatase 12/08/2022 59  38 - 126 U/L Final   Total Bilirubin 12/08/2022 0.4  0.3 - 1.2 mg/dL Final   GFR, Estimated 12/08/2022 60 (L)  >60 mL/min Final   Comment: (NOTE) Calculated using the CKD-EPI Creatinine Equation  (2021)    Anion gap 12/08/2022 9  5 - 15 Final   Performed at [facility], [address] [place], [place] [address]    RADIOGRAPHIC STUDIES: I have personally reviewed the radiological images as listed and agree with the findings in the report  No results found.  ASSESSMENT/PLAN  Anemia:  Etiology multifactorial 1) Iron deficiency from occult GI blood loss (known polyps) exacerbated by anticoagulant therapy 2) CKD 3) Chronic inflammation  December 21, 2022 through January 04, 2023- Received total of 1000 mg of Venofer   Arterial thrombosis-  Has history of CVA and limb loss.  Risk factors are DM Type II, Tobacco use December 08 2022- On chronic anticoagulation with Eliquis.  A change to antiplatelet therapy may be appropriate.  Smoking cessation would be very beneficial in terms of risk reduction  Family history of malignancy  December 08 2022-- Offered appointment with Genetics.  Patient declined   Cancer Staging  No matching staging information was found for the patient.   No problem-specific Assessment & Plan notes found for this encounter.    No orders of the defined types were placed in this encounter.   30  minutes was spent in patient care.  This included time spent preparing to see the patient (e.g., review of tests), obtaining and/or reviewing separately obtained history, counseling and educating the patient/family/caregiver, ordering medications, tests, or procedures; documenting clinical information in the electronic or other health record, independently interpreting results and communicating results to the patient/family/caregiver as well as coordination of care.       All questions were answered. The patient knows to call the clinic with any problems, questions or concerns.  This note was electronically signed.    Loni Muse, MD  01/05/2023 8:17 AM

## 2023-01-08 ENCOUNTER — Other Ambulatory Visit: Payer: Self-pay | Admitting: Family Medicine

## 2023-01-08 DIAGNOSIS — R110 Nausea: Secondary | ICD-10-CM

## 2023-01-09 ENCOUNTER — Other Ambulatory Visit: Payer: Self-pay

## 2023-01-09 ENCOUNTER — Encounter: Payer: Self-pay | Admitting: Oncology

## 2023-01-09 ENCOUNTER — Telehealth: Payer: Self-pay

## 2023-01-09 DIAGNOSIS — I10 Essential (primary) hypertension: Secondary | ICD-10-CM

## 2023-01-09 MED ORDER — NIFEDIPINE ER OSMOTIC RELEASE 30 MG PO TB24
30.0000 mg | ORAL_TABLET | Freq: Every day | ORAL | 1 refills | Status: AC
Start: 2023-01-09 — End: ?

## 2023-01-09 MED ORDER — ATENOLOL 50 MG PO TABS
50.0000 mg | ORAL_TABLET | Freq: Every day | ORAL | 1 refills | Status: DC
Start: 2023-01-09 — End: 2023-09-16

## 2023-01-09 MED ORDER — OMEPRAZOLE 40 MG PO CPDR: 40.0000 mg | DELAYED_RELEASE_CAPSULE | Freq: Every day | ORAL | 1 refills | Status: AC

## 2023-01-09 NOTE — Progress Notes (Signed)
Care Management & Coordination Services Pharmacy Team  Reason for Encounter: Appointment Reminder  Contacted patient to confirm telephone appointment with Artelia Laroche, PharmD on 01/11/23 at 1:00 pm.  Unsuccessful outreach. Left voicemail for patient to return call.  Chart review:  Recent office visits:  None  Recent consult visits:  01/05/23 (Cancer Center) Leatha Gilding MD. Seen for Anemia. No med changes.   01/04/23 (Oncology) Seen for infusion. No med changes.  12/30/22 (Oncology) Seen for infusion. No med changes.  12/28/22 (Oncology)  Seen for infusion. No med changes.  12/23/22 (Oncology) Seen for infusion. No med changes.  12/21/22 (Oncology) Seen for infusion. No med changes.  12/08/22 (Oncology) Leatha Gilding MD. Seen for Anemia. Referral to Gastroenterology. D/C Promethazine 25mg .   Hospital visits:  None  Star Rating Drugs:  Medication:  Last Fill: Day Supply Lisinopril   11/15/22-08/27/22 90ds Simvastatin   12/24/22-09/23/22 90ds  Care Gaps: Annual wellness visit in last year? No  If Diabetic:None noted  Last eye exam / retinopathy screening: Last diabetic foot exam:   Roxana Hires, [facility] Clinical Pharmacist Assistant  [phone]

## 2023-01-11 ENCOUNTER — Other Ambulatory Visit: Payer: Self-pay

## 2023-01-11 ENCOUNTER — Ambulatory Visit: Payer: Medicare HMO

## 2023-01-11 DIAGNOSIS — G43011 Migraine without aura, intractable, with status migrainosus: Secondary | ICD-10-CM

## 2023-01-11 DIAGNOSIS — E782 Mixed hyperlipidemia: Secondary | ICD-10-CM

## 2023-01-11 MED ORDER — LINACLOTIDE 145 MCG PO CAPS: ORAL_CAPSULE | ORAL | 1 refills | Status: DC

## 2023-01-11 MED ORDER — UBRELVY 100 MG PO TABS
100.0000 mg | ORAL_TABLET | Freq: Once | ORAL | 0 refills | Status: DC | PRN
Start: 2023-01-11 — End: 2023-04-27

## 2023-01-11 MED ORDER — OMEGA-3-ACID ETHYL ESTERS 1 G PO CAPS
ORAL_CAPSULE | ORAL | 1 refills | Status: AC
Start: 2023-01-11 — End: ?

## 2023-01-11 NOTE — Patient Outreach (Signed)
Care Management & Coordination Services Pharmacy Note  01/11/2023 Name:  Anna Hayes MRN:  [id number] DOB:  04-06-1951  Summary: -Pleasant 72 year old female presents for initial CCM visit. She has been a widow for 10 years and lives with her daughter. 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 Her left knee was amputated in 2021 and she struggles with it. She was doing Geophysicist/field seismologist as exercise but is hesitant to go back due to Covid  Recommendations/Changes made from today's visit: -Patient Trigs are elevated. She was prescribed Lovaza but hasn't taken in 2 years (Confirmed with Barbette Or at [facility]). Recommend high intensity statin for secondary prophylaxis -Patient non-compliant on Ubrogepant (Never picked up). Sent msg to [employer] to send in refill -Recommend Propranolol instead of Atenolol. Propranolol can help with her headaches and Atenolol has negative outcomes per 2004 Lancet Article. Will cosign PCP -9 trials including over 17,000 patients, Atenolol vs Placebo: Atenolol was not statistically different. Atenolol vs other anti-hypertensives: All cause mortality was worse with Atenolol NNH: 111 and for Cardio Mortality NNH: 143   Subjective: Anna Hayes is an 72 y.o. year old female who is a primary patient of Cox, Kirsten, MD.  The care coordination team was consulted for assistance with disease management and care coordination needs.    Engaged with patient by telephone for follow up visit.  Recent office visits:  None   Recent consult visits:  01/05/23 [facility]) Leatha Gilding MD. Seen for Anemia. No med changes.    01/04/23 (Oncology) Seen for infusion. No med changes.   12/30/22 (Oncology) Seen for infusion. No med changes.   12/28/22 (Oncology)  Seen for infusion. No med changes.   12/23/22 (Oncology) Seen  for infusion. No med changes.   12/21/22 (Oncology) Seen for infusion. No med changes.   12/08/22 (Oncology) Leatha Gilding MD. Seen for Anemia. Referral to Gastroenterology. D/C Promethazine 25mg .    Hospital visits:  None   Objective:  Lab Results  Component Value Date   CREATININE 0.99 12/08/2022   BUN 18 12/08/2022   EGFR 67 10/13/2022   GFRNONAA 60 (L) 12/08/2022   GFRAA 70 08/19/2020   NA 133 (L) 12/08/2022   K 4.6 12/08/2022   CALCIUM 9.2 12/08/2022   CO2 25 12/08/2022   GLUCOSE 107 (H) 12/08/2022    No results found for: "HGBA1C", "FRUCTOSAMINE", "GFR", "MICROALBUR"  Last diabetic Eye exam: No results found for: "HMDIABEYEEXA"  Last diabetic Foot exam: No results found for: "HMDIABFOOTEX"   Lab Results  Component Value Date   CHOL 153 10/13/2022   HDL 46 10/13/2022   LDLCALC 81 10/13/2022   TRIG 150 (H) 10/13/2022   CHOLHDL 3.3 10/13/2022       Latest Ref Rng & Units 12/08/2022   11:37 AM 10/13/2022    3:46 PM 06/29/2022   10:06 AM  Hepatic Function  Total Protein 6.5 - 8.1 g/dL 8.2  7.3  7.3   Albumin 3.5 - 5.0 g/dL 4.2  4.1  4.5   AST 15 - 41 U/L 21  30  20    ALT 0 - 44 U/L 21  27  19    Alk Phosphatase 38 - 126 U/L 59  99  69   Total Bilirubin 0.3 - 1.2 mg/dL 0.4  <6.2  0.2  Lab Results  Component Value Date/Time   TSH 2.200 10/13/2022 03:46 PM   TSH 1.620 06/29/2022 10:06 AM   FREET4 0.82 10/13/2022 03:46 PM   FREET4 0.80 (L) 06/29/2022 10:07 AM       Latest Ref Rng & Units 01/05/2023   11:11 AM 12/08/2022   11:37 AM 10/28/2022    9:13 AM  CBC  WBC 4.0 - 10.5 K/uL 7.1  7.5  6.8   Hemoglobin 12.0 - 15.0 g/dL 96.0  45.4  09.8   Hematocrit 36.0 - 46.0 % 36.7  36.8  34.6   Platelets 150 - 400 K/uL 455  490  474     Lab Results  Component Value Date/Time   VD25OH 38.0 06/29/2022 10:41 AM   VITAMINB12 488 12/08/2022 11:38 AM   VITAMINB12 >2000 (H) 03/01/2021 02:45 PM    Clinical ASCVD: Yes  The ASCVD Risk score (Arnett DK, et al.,  2019) failed to calculate for the following reasons:   The patient has a prior MI or stroke diagnosis    Other: (CHADS2VASc if Afib, MMRC or CAT for COPD, ACT, DEXA)     10/13/2022    1:52 PM 07/27/2022   10:49 AM 06/29/2022    9:30 AM  Depression screen PHQ 2/9  Decreased Interest 0 0 0  Down, Depressed, Hopeless 0 0 0  PHQ - 2 Score 0 0 0  Altered sleeping  0 0  Tired, decreased energy  0 0  Change in appetite  0 0  Feeling bad or failure about yourself   0 0  Trouble concentrating  0 0  Moving slowly or fidgety/restless  0 0  Suicidal thoughts  0 0  PHQ-9 Score  0 0  Difficult doing work/chores  Not difficult at all Not difficult at all     Social History   Tobacco Use  Smoking Status Some Days   Packs/day: 0.25   Years: 53.00   Additional pack years: 0.00   Total pack years: 13.25   Types: Cigarettes  Smokeless Tobacco Never   BP Readings from Last 3 Encounters:  01/05/23 139/76  01/04/23 135/74  12/30/22 (!) 140/85   Pulse Readings from Last 3 Encounters:  01/05/23 66  01/04/23 77  12/30/22 74   Wt Readings from Last 3 Encounters:  12/08/22 192 lb 3.2 oz (87.2 kg)  10/13/22 185 lb (83.9 kg)  07/27/22 182 lb (82.6 kg)   BMI Readings from Last 3 Encounters:  12/08/22 34.59 kg/m  10/13/22 33.30 kg/m  07/27/22 32.76 kg/m    Allergies  Allergen Reactions   Latex Other (See Comments)    Blisters   Codeine Other (See Comments)    UNSPECIFIED REACTION    Lyrica [Pregabalin] Hives   Nurtec [Rimegepant Sulfate] Other (See Comments)    Stomach upset   Penicillins Hives, Swelling and Other (See Comments)    Has patient had a PCN reaction causing immediate rash, facial/tongue/throat swelling, SOB or lightheadedness with hypotension: Yes Has patient had a PCN reaction causing severe rash involving mucus membranes or skin necrosis: No Has patient had a PCN reaction that required hospitalization: No Has patient had a PCN reaction occurring within the last  10 years: No If all of the above answers are "NO", then may proceed with Cephalosporin use.    Amlodipine Other (See Comments)    Pedal edema   Aspirin Palpitations and Other (See Comments)    Abdominal pain   Sulfa Antibiotics Rash    Medications Reviewed  Today     Reviewed by Arville Care, CMA (Certified Medical Assistant) on 01/05/23 at 1042  Med List Status: <None>   Medication Order Taking? Sig Documenting Provider Last Dose Status Informant  atenolol (TENORMIN) 50 MG tablet [id number] Yes TAKE 1 TABLET BY MOUTH EVERY DAY Janie Morning, NP Taking Active   butalbital-acetaminophen-caffeine (FIORICET) 50-325-40 MG tablet [id number] Yes Take 1 tablet by mouth every 6 (six) hours as needed for migraine. Marianne Sofia, PA-C Taking Active   cyanocobalamin (,VITAMIN B-12,) 1000 MCG/ML injection [id number] Yes Inject into the muscle. [provider] Taking Active   fluticasone (FLONASE) 50 MCG/ACT nasal spray [id number] Yes SPRAY 2 SPRAYS INTO EACH NOSTRIL EVERY DAY Janie Morning, NP Taking Active   furosemide (LASIX) 20 MG tablet [id number] Yes Take 20 mg by mouth daily as needed for fluid.  [provider] Taking Active Self           Med Note Nancy Marus, Sharlene Dory   Wed Jun 28, 2017 12:15 PM)    levothyroxine (SYNTHROID) 50 MCG tablet [id number] Yes Take 1 tablet (50 mcg total) by mouth daily. Janie Morning, NP Taking Active   linaclotide [facility]) 145 MCG CAPS capsule [id number] Yes TAKE 1 CAPSULE BY MOUTH EVERY DAY BEFORE BREAKFAST Janie Morning, NP Taking Active   lisinopril (ZESTRIL) 20 MG tablet [id number] Yes TAKE 1 TABLET BY MOUTH EVERY DAY Janie Morning, NP Taking Active   NIFEdipine (PROCARDIA XL) 30 MG 24 hr tablet [id number] Yes Take 1 tablet (30 mg total) by mouth daily. Janie Morning, NP Taking Active   omega-3 acid ethyl esters (LOVAZA) 1 g capsule [id number] Yes TAKE 2 CAPSULES BY MOUTH 2 TIMES DAILY. Cox, Kirsten, MD Taking Active   omeprazole  (PRILOSEC) 40 MG capsule [id number] Yes Take 1 capsule (40 mg total) by mouth daily. Janie Morning, NP Taking Active   ondansetron [facility]) 4 MG tablet [id number] Yes Take 1 tablet (4 mg total) by mouth every 8 (eight) hours as needed for nausea or vomiting. Cox, Kirsten, MD Taking Active   rivaroxaban (XARELTO) 20 MG TABS tablet [id number] Yes Take 1 tablet (20 mg total) by mouth daily with supper. Cox, Kirsten, MD Taking Active   simvastatin (ZOCOR) 40 MG tablet [id number] Yes Take 1 tablet (40 mg total) by mouth daily. Marianne Sofia, PA-C Taking Active   Ubrogepant (UBRELVY) 100 MG TABS [id number] Yes Take 100 mg by mouth once as needed for up to 1 dose (migraine headache). Janie Morning, NP Taking Active             SDOH:  (Social Determinants of Health) assessments and interventions performed: Yes SDOH Interventions    Flowsheet Row Care Coordination from 01/11/2023 in [facility] Office Visit from 06/29/2022 in [facility] Chronic Care Management from 04/13/2022 in [facility] Clinical Support from 07/23/2021 in [facility] Chronic Care Management from 07/02/2021 in [facility]  SDOH Interventions       Food Insecurity Interventions -- -- -- Intervention Not Indicated --  Housing Interventions -- -- -- Intervention Not Indicated --  Transportation Interventions Intervention Not Indicated -- Intervention Not Indicated -- Other (Comment), Intervention Not Indicated  [Amputation but daughter takes care of her]  Utilities Interventions -- Intervention Not Indicated -- -- --  Financial Strain Interventions Intervention Not Indicated -- Intervention Not Indicated -- Intervention Not Indicated  Physical Activity Interventions -- Intervention Not  Indicated -- -- --  Stress Interventions -- Intervention Not Indicated -- -- --  Social Connections Interventions -- Intervention Not Indicated -- -- --        Medication Assistance: None required.  Patient affirms current coverage meets needs.  Medication Access: Within the past 30 days, how often has patient missed a dose of medication? Often Is a pillbox or other method used to improve adherence? No  Factors that may affect medication adherence? nonadherence to medications Are meds synced by current pharmacy? No  Are meds delivered by current pharmacy? No  Does patient experience delays in picking up medications due to transportation concerns? No   [facility] Reviewed: Is patient disadvantaged to use [facility]?: Yes  Current Rx insurance plan: Medicare/Medicaid Name and location of Current pharmacy:  [id number] [place], [place] - [address] [place] [address] Phone: [phone] Fax: [phone]  [facility] services reviewed with patient today?: No  Patient requests to transfer care to [facility]?: No  Reason patient declined to change pharmacies: Disadvantaged due to insurance/mail order  Compliance/Adherence/Medication fill history: Star Rating Drugs:  Medication:                Last Fill:         Day Supply Lisinopril                      11/15/22-08/27/22 90ds Simvastatin                 12/24/22-09/23/22 90ds   Care Gaps: Annual wellness visit in last year? No    Assessment/Plan   Cardio (BP goal <140/90) BP Readings from Last 3 Encounters:  01/05/23 139/76  01/04/23 135/74  12/30/22 (!) 140/85    Pulse Readings from Last 3 Encounters:  01/05/23 66  01/04/23 77  12/30/22 74  -Controlled -Current treatment: Lisinopril 20mg  Appropriate, Effective, Safe, Accessible Atenolol 50mg  Query Appropriate,  Furosemide 20mg  PRN Appropriate, Effective, Safe, Accessible Niefdipine ER Appropriate, Effective, Safe, Accessible -Medications previously tried: N/A  -Current home readings: Not testing -Current dietary habits: "Tries to eat  healthy"   -Current exercise habits: Was doing "Silver Sneakers" but stopped once Covid started and hasn't been back -Denies hypotensive/hypertensive symptoms -Educated on BP goals and benefits of medications for prevention of heart attack, stroke and kidney damage; Extensive time spent on counseling patient on blood pressure goal and impact of each antihypertensive medication on their blood pressure and risk reduction for CV disease.  Used analogies to explain the need for multiple antihypertensive medications to achieve BP goals and that it is often a silent disease with no symptoms.  -Counseled to monitor BP at home Weekly, document, and provide log at future appointments October 2022: Counseled patient on importance of BP readings and about possibly going back to [employer]. She's afraid of Covid but is not up to date on vaccinations (Has 3/4 shots). Recommend she get 4th booster and then possibly try exercising July 2023: When I called patient she was sonically upset. She stated I "Interrupted her nap". I asked if she had gotten a reminder call and she stated she did but that she forgot and took a nap. I asked what her BP was and she said she documents it but didn't want to get up and get the numbers, she wanted to go back to sleep.  April 2024: Recommend Propranolol instead of Atenolol. Propranolol can help with  her headaches and Atenolol has negative outcomes per 2004 Lancet Article. Will cosign PCP -9 trials including over 17,000 patients, Atenolol vs Placebo: Atenolol was not statistically different. Atenolol vs other anti-hypertensives: All cause mortality was worse with Atenolol NNH: 111 and for Cardio Mortality NNH: 143   Hyperlipidemia: (LDL goal < 100) The ASCVD Risk score (Arnett DK, et al., 2019) failed to calculate for the following reasons:   The patient has a prior MI or stroke diagnosis Lab Results  Component Value Date   CHOL 153 10/13/2022   CHOL 140 06/29/2022   CHOL 131  03/23/2022   Lab Results  Component Value Date   HDL 46 10/13/2022   HDL 48 06/29/2022   HDL 46 03/23/2022   Lab Results  Component Value Date   LDLCALC 81 10/13/2022   LDLCALC 61 06/29/2022   LDLCALC 57 03/23/2022   Lab Results  Component Value Date   TRIG 150 (H) 10/13/2022   TRIG 188 (H) 06/29/2022   TRIG 168 (H) 03/23/2022   Lab Results  Component Value Date   CHOLHDL 3.3 10/13/2022   CHOLHDL 2.9 06/29/2022   CHOLHDL 2.8 03/23/2022   No results found for: "LDLDIRECT" Last vitamin D Lab Results  Component Value Date   VD25OH 38.0 06/29/2022   Lab Results  Component Value Date   TSH 2.200 10/13/2022  -Controlled -Current treatment: Simvastatin  Appropriate, Query effective,  Lovaza 1G take 2BID Appropriate, Query effective,  -Medications previously tried: N/A  -Current dietary habits: "Tries to eat healthy"   -Current exercise habits: Was doing "Silver Sneakers" but stopped once Covid started and hasn't been back -Educated on Cholesterol goals; .lipiplan April: Recommend high intensity statin for Trigs. Patient can't afford Lovaza and hasn't taken in 2 years   Thyroid Lab Results  Component Value Date   TSH 2.200 10/13/2022  -Controlled -Current treatment  Levothyroxine Appropriate, Effective, Safe, Accessible -Medications previously tried: N/A  -Recommended to continue current medication   Migraine (Goal: Decrease amount of Migraines) -Controlled -Current treatment: Butalbital Appropriate, Effective, Safe, Accessible Ubrogepant Appropriate, Effective, Safe, Query accessible -Medications previously tried: N/A  -Aura warning S/S: Didn't specify -Triggers:Didn't specify -Counseled on tracking auras and triggers April 2024: Patient got samples of Ubrogepant and a script was never sent in. Hasn't taken it since but she states it did help. Will ask [facility] to send in -Will ask PCP to replace Atenolol with Propranolol    Anticoagulation -Acquired Thrombophilia -Controlled -Current treatment  Xarelto  hs Appropriate, Effective, Safe, Accessible -Medications previously tried: N/A  -Recommended to continue current medication  CP F/U October 2024  Artelia Laroche, Pharm.D. - [phone]

## 2023-01-12 ENCOUNTER — Telehealth: Payer: Self-pay

## 2023-01-12 NOTE — Telephone Encounter (Signed)
PA submitted and denied via covermymeds for lovaza, see media for additional info.

## 2023-01-16 ENCOUNTER — Ambulatory Visit: Payer: Medicare HMO | Admitting: Nurse Practitioner

## 2023-02-02 ENCOUNTER — Inpatient Hospital Stay: Payer: Medicare HMO | Attending: Oncology | Admitting: Oncology

## 2023-02-02 ENCOUNTER — Ambulatory Visit: Payer: Medicare HMO | Admitting: Family Medicine

## 2023-02-02 ENCOUNTER — Inpatient Hospital Stay: Payer: Medicare HMO

## 2023-02-02 VITALS — BP 120/50 | HR 61 | Temp 98.6°F | Resp 14 | Ht 62.5 in | Wt 191.4 lb

## 2023-02-02 DIAGNOSIS — Z720 Tobacco use: Secondary | ICD-10-CM | POA: Diagnosis not present

## 2023-02-02 DIAGNOSIS — Z7901 Long term (current) use of anticoagulants: Secondary | ICD-10-CM | POA: Diagnosis not present

## 2023-02-02 DIAGNOSIS — Z79899 Other long term (current) drug therapy: Secondary | ICD-10-CM | POA: Insufficient documentation

## 2023-02-02 DIAGNOSIS — D539 Nutritional anemia, unspecified: Secondary | ICD-10-CM

## 2023-02-02 LAB — CBC WITH DIFFERENTIAL/PLATELET
Abs Immature Granulocytes: 0.03 10*3/uL (ref 0.00–0.07)
Basophils Absolute: 0 10*3/uL (ref 0.0–0.1)
Basophils Relative: 0 %
Eosinophils Absolute: 0.4 10*3/uL (ref 0.0–0.5)
Eosinophils Relative: 6 %
HCT: 35.2 % — ABNORMAL LOW (ref 36.0–46.0)
Hemoglobin: 10.8 g/dL — ABNORMAL LOW (ref 12.0–15.0)
Immature Granulocytes: 1 %
Lymphocytes Relative: 31 %
Lymphs Abs: 2.1 10*3/uL (ref 0.7–4.0)
MCH: 24.7 pg — ABNORMAL LOW (ref 26.0–34.0)
MCHC: 30.7 g/dL (ref 30.0–36.0)
MCV: 80.5 fL (ref 80.0–100.0)
Monocytes Absolute: 0.8 10*3/uL (ref 0.1–1.0)
Monocytes Relative: 12 %
Neutro Abs: 3.3 10*3/uL (ref 1.7–7.7)
Neutrophils Relative %: 50 %
Platelets: 439 10*3/uL — ABNORMAL HIGH (ref 150–400)
RBC: 4.37 MIL/uL (ref 3.87–5.11)
RDW: 15.9 % — ABNORMAL HIGH (ref 11.5–15.5)
WBC: 6.7 10*3/uL (ref 4.0–10.5)
nRBC: 0 % (ref 0.0–0.2)

## 2023-02-02 NOTE — Progress Notes (Signed)
[facility] Cancer Follow up Visit:  Patient Care Team: Blane Ohara, MD as PCP - General (Family Medicine) Zettie Pho, [facility] (Pharmacist)  CHIEF COMPLAINTS/PURPOSE OF CONSULTATION:   HISTORY OF PRESENTING ILLNESS: Anna Hayes 72 y.o. female is here because of anemia Medical history notable for lower extremity DVT, heart murmur, hypertension, myalgias, palpitations, septic thrombophlebitis, CVA, left AKA due to peripheral vascular disease with resultant bilateral femoral artery embolus, fasciotomy, chronic anticoagulation with Xarelto  December 06, 2021: Colonoscopy demonstrated 10 right colon polyps and 9 left colon polyps 0.2 to 0.7 cm in diameter which were removed.  A few diverticula in right colon.  Follow-up colonoscopy recommended in 1 year  October 13, 2022:: WBC 6.8 hemoglobin 10.8 MCV 79 platelet count 474; 51 segs 33 lymphs 12 monos 3 eos 1 basophil Ferritin 32 CMP notable for CO2 of 18  December 08, 2022: [facility] Hematology Consult  Patient is on Xarelto because of prior history of CVA and PVD.  She is not taking oral iron.  Has never received PRBC's nor IV iron. Has not yet been called by GI for follow up colonoscopy.  No history of EGD.  Takes tylenol for pain but no NSAIDS.   No pica to ice, uncooked starches Does not want to see a geneticist regarding family history of cancer.    Social:  Widowed.  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  Tobacco claims 1/2 ppd but has smoked up to 2 ppd.  EtOH none  [facility] Mother died 36 breast cancer Father died 64 prostate cancer Sister alive 52 HTN Sister alive 40's HTN Brother died 36 prostate cancer  WBC 7.5 hemoglobin 11.3 MCV 81 platelet count 490; 49 segs 32 lymphs 12 monos 6 eos 1 basophil.  Reticulocyte count 1.4%  Hemoglobin electrophoresis showed 91.2% hemoglobin A hemoglobin 8 to 4.5% hemoglobin F 4.3% felt to be consistent with beta thalassemia minor.   INR 1.3 PTT 32 fibrinogen  518 Coombs test negative.  Haptoglobin 234 SPEP with IEP showed no paraprotein.  Serum free kappa 30.2 lambda 14.1 with a kappa lambda 2.14 IgG 1216 IgA 324 IgM 21 Ferritin 13 folate 21.6 B12 488 Copper 142 zinc 73 CMP notable for sodium 133 glucose 107  December 21, 2022 through January 04, 2023: Received total of 1000 mg of Venofer  January 05, 2023:  Reviewed results of labs with patient and daughter.  Still fatigued.  Tolerated IV iron well.   Hgb 11.5 Ferritin 13. Folate 21.6  Feb 02 2023:  Scheduled follow-up for anemia.  Still smoking - no more and no less.  Not taking oral iron.   Hemoglobin 10.8 MCV 81  Review of Systems  Constitutional:  Negative for appetite change, chills, fatigue and fever.       Has been gaining weight  HENT:   Negative for mouth sores, nosebleeds, sore throat and trouble swallowing.   Eyes:  Negative for eye problems and icterus.       Vision changes:  None  Respiratory:  Negative for chest tightness, cough, hemoptysis, shortness of breath and wheezing.        PND:  none Orthopnea:  none DOE:    Cardiovascular:  Negative for chest pain, leg swelling and palpitations.       PND:  none Orthopnea:  none  Gastrointestinal:  Positive for constipation and nausea. Negative for abdominal pain, blood in stool, diarrhea and vomiting.  Endocrine: Negative for hot flashes.  Cold intolerance:  none Heat intolerance:  none  Genitourinary:  Negative for bladder incontinence, difficulty urinating, dysuria, frequency, hematuria and nocturia.   Musculoskeletal:  Positive for gait problem. Negative for arthralgias, back pain, myalgias, neck pain and neck stiffness.  Skin:  Negative for itching, rash and wound.  Neurological:  Positive for gait problem and headaches. Negative for dizziness, extremity weakness, light-headedness, numbness, seizures and speech difficulty.  Hematological:  Negative for adenopathy. Does not bruise/bleed easily.  Psychiatric/Behavioral:   Negative for suicidal ideas. The patient is not nervous/anxious.     MEDICAL HISTORY: Past Medical History:  Diagnosis Date   Anemia    Constipation    DVT of lower extremity (deep venous thrombosis) (HCC)    Frequent headaches    Heart murmur    found during pregnancy -  no problems   Hypertension    Muscle pain    Palpitations    Septic thrombophlebitis 08/31/2017   Sinus problem    Stroke [facility])     SURGICAL HISTORY: Past Surgical History:  Procedure Laterality Date   AMPUTATION Left 07/05/2017   Procedure: LEFT ABOVE KNEE AMPUTATION;  Surgeon: Fransisco Hertz, MD;  Location: [facility] OR;  Service: Vascular;  Laterality: Left;   CATARACT EXTRACTION W/PHACO Right 06/12/2018   Procedure: CATARACT EXTRACTION WITH INTRAOCULAR LENS IMPLANT;  Surgeon: Carmela Rima, MD;  Location: [facility] OR;  Service: Ophthalmology;  Laterality: Right;   COLONOSCOPY  11/26/2015   Dr Jennye Boroughs - follow-up in 5 years   EMBOLECTOMY Bilateral 06/27/2017   Procedure: BILATERAL FEMORAL POPLITEAL EMBOLECTOMY;  Surgeon: Nada Libman, MD;  Location: [facility] OR;  Service: Vascular;  Laterality: Bilateral;   FASCIOTOMY Left 06/27/2017   Procedure: FULL COMPARTMENT LEFT LOWER LEG FASCIOTOMY;  Surgeon: Nada Libman, MD;  Location: [facility] OR;  Service: Vascular;  Laterality: Left;   PARTIAL HYSTERECTOMY     PATCH ANGIOPLASTY Left 06/27/2017   Procedure: PATCH ANGIOPLASTY Left Posterior Tibial Artery;  Surgeon: Nada Libman, MD;  Location: [facility] OR;  Service: Vascular;  Laterality: Left;   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 07/11/2017   Procedure: REPAIR OF COMPLEX TRACTION RETINAL DETACHMENT WITH ENDO LASER AND ANTIFUNGAL INJECTION;  Surgeon: Carmela Rima, MD;  Location: [facility] OR;  Service: Ophthalmology;  Laterality: Right;   TEE WITHOUT CARDIOVERSION N/A 07/11/2017   Procedure: TRANSESOPHAGEAL ECHOCARDIOGRAM (TEE);  Surgeon: Elease Hashimoto Deloris Ping, MD;  Location: [facility] ENDOSCOPY;  Service: Cardiovascular;   Laterality: N/A;    SOCIAL HISTORY: Social History   Socioeconomic History   Marital status: Widowed    Spouse name: Not on file   Number of children: Not on file   Years of education: Not on file   Highest education level: Not on file  Occupational History   Not on file  Tobacco Use   Smoking status: Some Days    Packs/day: 0.25    Years: 53.00    Additional pack years: 0.00    Total pack years: 13.25    Types: Cigarettes   Smokeless tobacco: Never  Vaping Use   Vaping Use: Never used  Substance and Sexual Activity   Alcohol use: No   Drug use: No   Sexual activity: Not Currently  Other Topics Concern   Not on file  Social History Narrative   Lives with her daughter   Social Determinants of Health   Financial Resource Strain: Low Risk  (01/11/2023)   Overall Financial Resource Strain (CARDIA)    Difficulty of Paying Living Expenses: Not  hard at all  Food Insecurity: No Food Insecurity (07/23/2021)   Hunger Vital Sign    Worried About Running Out of Food in the Last Year: Never true    Ran Out of Food in the Last Year: Never true  Transportation Needs: No Transportation Needs (01/11/2023)   PRAPARE - Administrator, Civil Service (Medical): No    Lack of Transportation (Non-Medical): No  Physical Activity: Insufficiently Active (06/29/2022)   Exercise Vital Sign    Days of Exercise per Week: 1 day    Minutes of Exercise per Session: 60 min  Stress: No Stress Concern Present (06/29/2022)   [employer] of Occupational Health - Occupational Stress Questionnaire    Feeling of Stress : Not at all  Social Connections: Moderately Isolated (06/29/2022)   Social Connection and Isolation Panel [NHANES]    Frequency of Communication with Friends and Family: More than three times a week    Frequency of Social Gatherings with Friends and Family: More than three times a week    Attends Religious Services: 1 to 4 times per year    Active Member of [employer] or  Organizations: No    Attends Banker Meetings: Never    Marital Status: Widowed  Intimate Partner Violence: Not At Risk (06/29/2022)   Humiliation, Afraid, Rape, and Kick questionnaire    Fear of Current or Ex-Partner: No    Emotionally Abused: No    Physically Abused: No    Sexually Abused: No    FAMILY HISTORY Family History  Problem Relation Age of Onset   Breast cancer Mother    Prostate cancer Father    Prostate cancer Brother     ALLERGIES:  is allergic to latex, codeine, lyrica [pregabalin], nurtec [rimegepant sulfate], penicillins, amlodipine, aspirin, and sulfa antibiotics.  MEDICATIONS:  Current Outpatient Medications  Medication Sig Dispense Refill   atenolol (TENORMIN) 50 MG tablet Take 1 tablet (50 mg total) by mouth daily. 90 tablet 1   butalbital-acetaminophen-caffeine (FIORICET) 50-325-40 MG tablet Take 1 tablet by mouth every 6 (six) hours as needed for migraine. 30 tablet 2   cyanocobalamin (,VITAMIN B-12,) 1000 MCG/ML injection Inject into the muscle.     fluticasone (FLONASE) 50 MCG/ACT nasal spray SPRAY 2 SPRAYS INTO EACH NOSTRIL EVERY DAY 48 mL 2   furosemide (LASIX) 20 MG tablet Take 20 mg by mouth daily as needed for fluid.      levothyroxine (SYNTHROID) 50 MCG tablet Take 1 tablet (50 mcg total) by mouth daily. 90 tablet 3   linaclotide (LINZESS) 145 MCG CAPS capsule TAKE 1 CAPSULE BY MOUTH EVERY DAY BEFORE BREAKFAST 90 capsule 1   lisinopril (ZESTRIL) 20 MG tablet TAKE 1 TABLET BY MOUTH EVERY DAY 90 tablet 3   NIFEdipine (PROCARDIA XL) 30 MG 24 hr tablet Take 1 tablet (30 mg total) by mouth daily. 90 tablet 1   omega-3 acid ethyl esters (LOVAZA) 1 g capsule TAKE 2 CAPSULES BY MOUTH 2 TIMES DAILY. 360 capsule 1   omeprazole (PRILOSEC) 40 MG capsule Take 1 capsule (40 mg total) by mouth daily. 90 capsule 1   ondansetron (ZOFRAN) 4 MG tablet TAKE 1 TABLET BY MOUTH EVERY 8 HOURS AS NEEDED FOR NAUSEA AND VOMITING 90 tablet 1   rivaroxaban (XARELTO)  20 MG TABS tablet Take 1 tablet (20 mg total) by mouth daily with supper. 90 tablet 1   simvastatin (ZOCOR) 40 MG tablet Take 1 tablet (40 mg total) by mouth daily. 90 tablet 0  Ubrogepant (UBRELVY) 100 MG TABS Take 1 tablet (100 mg total) by mouth once as needed for up to 1 dose (migraine headache). 2 tablet 0   No current facility-administered medications for this visit.    PHYSICAL EXAMINATION:  ECOG PERFORMANCE STATUS: 1 - Symptomatic but completely ambulatory   There were no vitals filed for this visit.   There were no vitals filed for this visit.    Physical Exam Vitals and nursing note reviewed.  Constitutional:      General: She is not in acute distress.    Appearance: Normal appearance. She is obese. She is not toxic-appearing or diaphoretic.     Comments: Here with daughter.  Seated in wheelchair  HENT:     Head: Normocephalic and atraumatic.     Right Ear: External ear normal.     Left Ear: External ear normal.     Nose: Nose normal. No congestion or rhinorrhea.  Eyes:     General: No scleral icterus.    Extraocular Movements: Extraocular movements intact.     Conjunctiva/sclera: Conjunctivae normal.     Pupils: Pupils are equal, round, and reactive to light.  Cardiovascular:     Rate and Rhythm: Normal rate.     Heart sounds: No murmur heard.    No friction rub. No gallop.  Abdominal:     General: Bowel sounds are normal.     Palpations: Abdomen is soft.  Musculoskeletal:        General: No swelling or tenderness.     Cervical back: Normal range of motion and neck supple. No rigidity or tenderness.     Comments: Left AKA  Lymphadenopathy:     Head:     Right side of head: No submental, submandibular, tonsillar, preauricular, posterior auricular or occipital adenopathy.     Left side of head: No submental, submandibular, tonsillar, preauricular, posterior auricular or occipital adenopathy.     Cervical: No cervical adenopathy.     Right cervical: No  superficial, deep or posterior cervical adenopathy.    Left cervical: No superficial, deep or posterior cervical adenopathy.     Upper Body:     Right upper body: No supraclavicular, axillary, pectoral or epitrochlear adenopathy.     Left upper body: No supraclavicular, axillary, pectoral or epitrochlear adenopathy.  Skin:    General: Skin is warm.     Coloration: Skin is not jaundiced.  Neurological:     General: No focal deficit present.     Mental Status: She is alert and oriented to person, place, and time.     Cranial Nerves: No cranial nerve deficit.     Motor: Weakness present.     Comments: Left hemiparesis  Psychiatric:        Mood and Affect: Mood normal.        Behavior: Behavior normal.        Thought Content: Thought content normal.        Judgment: Judgment normal.     LABORATORY DATA: I have personally reviewed the data as listed:  Appointment on 01/05/2023  Component Date Value Ref Range Status   Retic Ct Pct 01/05/2023 2.3  0.4 - 3.1 % Final   RBC. 01/05/2023 4.54  3.87 - 5.11 MIL/uL Final   Retic Count, Absolute 01/05/2023 102.6  19.0 - 186.0 K/uL Final   Immature Retic Fract 01/05/2023 31.5 (H)  2.3 - 15.9 % Final   Performed at [facility], [address], [place], [place] [address]  WBC 01/05/2023 7.1  4.0 - 10.5 K/uL Final   RBC 01/05/2023 4.52  3.87 - 5.11 MIL/uL Final   Hemoglobin 01/05/2023 11.5 (L)  12.0 - 15.0 g/dL Final   HCT 16/06/9603 36.7  36.0 - 46.0 % Final   MCV 01/05/2023 81.2  80.0 - 100.0 fL Final   MCH 01/05/2023 25.4 (L)  26.0 - 34.0 pg Final   MCHC 01/05/2023 31.3  30.0 - 36.0 g/dL Final   RDW 54/05/8118 16.1 (H)  11.5 - 15.5 % Final   Platelets 01/05/2023 455 (H)  150 - 400 K/uL Final   nRBC 01/05/2023 0.0  0.0 - 0.2 % Final   Neutrophils Relative % 01/05/2023 49  % Final   Neutro Abs 01/05/2023 3.5  1.7 - 7.7 K/uL Final   Lymphocytes Relative 01/05/2023 27  % Final   Lymphs Abs 01/05/2023 1.9  0.7 - 4.0 K/uL  Final   Monocytes Relative 01/05/2023 16  % Final   Monocytes Absolute 01/05/2023 1.1 (H)  0.1 - 1.0 K/uL Final   Eosinophils Relative 01/05/2023 6  % Final   Eosinophils Absolute 01/05/2023 0.4  0.0 - 0.5 K/uL Final   Basophils Relative 01/05/2023 1  % Final   Basophils Absolute 01/05/2023 0.0  0.0 - 0.1 K/uL Final   Immature Granulocytes 01/05/2023 1  % Final   Abs Immature Granulocytes 01/05/2023 0.09 (H)  0.00 - 0.07 K/uL Final   Performed at [facility], [address], [place], [place] [address]    RADIOGRAPHIC STUDIES: I have personally reviewed the radiological images as listed and agree with the findings in the report  No results found.  ASSESSMENT/PLAN  Anemia:  Etiology multifactorial 1) Iron deficiency from occult GI blood loss (known polyps) exacerbated by anticoagulant therapy 2) CKD 3) Chronic inflammation  December 21, 2022 through January 04, 2023- Received total of 1000 mg of Venofer  Feb 02, 2023- Hemoglobin 10.8 MCV 81.  Patient not taking oral iron.  Instructed her to begin Flintstone's vitamin with iron, nature made or solgar gentle iron Either daily or every other day.    Arterial thrombosis-  Has history of CVA and limb loss.  Risk factors are DM Type II, Tobacco use December 08 2022- On chronic anticoagulation with Eliquis.  A change to antiplatelet therapy may be appropriate.  Smoking cessation would be very beneficial in terms of risk reduction Feb 02 2023- Continues on Xarelto  Tobacco use  Feb 02 2023- Discussed smoking cessation  Family history of malignancy  December 08 2022-- Offered appointment with Genetics.  Patient declined   Cancer Staging  No matching staging information was found for the patient.   No problem-specific Assessment & Plan notes found for this encounter.    No orders of the defined types were placed in this encounter.   30  minutes was spent in patient care.  This included time spent preparing to see the patient  (e.g., review of tests), obtaining and/or reviewing separately obtained history, counseling and educating the patient/family/caregiver, ordering medications, tests, or procedures; documenting clinical information in the electronic or other health record, independently interpreting results and communicating results to the patient/family/caregiver as well as coordination of care.       All questions were answered. The patient knows to call the clinic with any problems, questions or concerns.  This note was electronically signed.    Loni Muse, MD  02/02/2023 11:43 AM

## 2023-02-02 NOTE — Patient Instructions (Signed)
Please begin taking either Flintstone's vitamin with iron, nature made or solgar gentle iron Either daily or every other day.

## 2023-02-03 ENCOUNTER — Telehealth: Payer: Self-pay | Admitting: Oncology

## 2023-02-03 NOTE — Telephone Encounter (Signed)
02/03/23 Spoke with patient and  confirmed next appt

## 2023-02-13 ENCOUNTER — Telehealth: Payer: Self-pay

## 2023-02-13 NOTE — Progress Notes (Cosign Needed Addendum)
Care Management & Coordination Services Pharmacy Team  Reason for Encounter: Hypertension  Contacted patient to discuss hypertension disease state. Spoke with patient on 02/13/2023     Current antihypertensive regimen:  Lisinopril 20 mg daily Atenolol 50 mg daily  Patient verbally confirms she is taking the above medications as directed. Yes  How often are you checking your Blood Pressure? 1-2x per week  she checks her blood pressure in the morning after taking her medication.  Current home BP readings: Patient stated she hasn't been writing readings down but have been ranging 138/65. Instructed patient to write readings down for next month's call Wrist or arm cuff: Arm Caffeine intake:Patient stated limited. May drink a sip of soda once in a while  Salt intake: Limited OTC medications including pseudoephedrine or NSAIDs? None  Any readings above 180/100? No  What recent interventions/DTPs have been made by any provider to improve Blood Pressure control since last CPP Visit:  None  Any recent hospitalizations or ED visits since last visit with CPP? No  What diet changes have been made to improve Blood Pressure Control?  Patient limits salt intake and drinks plenty of water daily  What exercise is being done to improve your Blood Pressure Control?  Patient states she walks some days and cleans around the house. Patient will start [employer] in a few weeks  Adherence Review: Is the patient currently on ACE/ARB medication? Yes Does the patient have >5 day gap between last estimated fill dates? No  Star Rating Drugs:  Lisinopril 20 mg- Last filled 01-28-2023 90 DS. Previous 11-15-2022 90 DS. Simvastatin 40 mg- Last filled 12-24-2022 90 DS. Previous 09-23-2022 90 DS  Chart Updates: Recent office visits:  02-02-2023 Loni Muse, MD (Oncology). Follow up visit no changes   Recent consult visits:  None  Hospital visits:  None in previous 6  months  Medications: Outpatient Encounter Medications as of 02/13/2023  Medication Sig   atenolol (TENORMIN) 50 MG tablet Take 1 tablet (50 mg total) by mouth daily.   butalbital-acetaminophen-caffeine (FIORICET) 50-325-40 MG tablet Take 1 tablet by mouth every 6 (six) hours as needed for migraine.   cyanocobalamin (,VITAMIN B-12,) 1000 MCG/ML injection Inject into the muscle.   fluticasone (FLONASE) 50 MCG/ACT nasal spray SPRAY 2 SPRAYS INTO EACH NOSTRIL EVERY DAY   furosemide (LASIX) 20 MG tablet Take 20 mg by mouth daily as needed for fluid.    levothyroxine (SYNTHROID) 50 MCG tablet Take 1 tablet (50 mcg total) by mouth daily.   linaclotide (LINZESS) 145 MCG CAPS capsule TAKE 1 CAPSULE BY MOUTH EVERY DAY BEFORE BREAKFAST   lisinopril (ZESTRIL) 20 MG tablet TAKE 1 TABLET BY MOUTH EVERY DAY   NIFEdipine (PROCARDIA XL) 30 MG 24 hr tablet Take 1 tablet (30 mg total) by mouth daily.   omega-3 acid ethyl esters (LOVAZA) 1 g capsule TAKE 2 CAPSULES BY MOUTH 2 TIMES DAILY.   omeprazole (PRILOSEC) 40 MG capsule Take 1 capsule (40 mg total) by mouth daily.   ondansetron (ZOFRAN) 4 MG tablet TAKE 1 TABLET BY MOUTH EVERY 8 HOURS AS NEEDED FOR NAUSEA AND VOMITING   rivaroxaban (XARELTO) 20 MG TABS tablet Take 1 tablet (20 mg total) by mouth daily with supper.   simvastatin (ZOCOR) 40 MG tablet Take 1 tablet (40 mg total) by mouth daily.   Ubrogepant (UBRELVY) 100 MG TABS Take 1 tablet (100 mg total) by mouth once as needed for up to 1 dose (migraine headache).   No facility-administered encounter medications  on file as of 02/13/2023.    Recent Office Vitals: BP Readings from Last 3 Encounters:  02/02/23 (!) 120/50  01/05/23 139/76  01/04/23 135/74   Pulse Readings from Last 3 Encounters:  02/02/23 61  01/05/23 66  01/04/23 77    Wt Readings from Last 3 Encounters:  02/02/23 191 lb 6.4 oz (86.8 kg)  12/08/22 192 lb 3.2 oz (87.2 kg)  10/13/22 185 lb (83.9 kg)     Kidney Function Lab  Results  Component Value Date/Time   CREATININE 0.99 12/08/2022 11:37 AM   CREATININE 0.91 10/13/2022 03:46 PM   CREATININE 0.47 (L) 08/31/2017 05:20 PM   GFRNONAA 60 (L) 12/08/2022 11:37 AM   GFRNONAA 101 08/31/2017 05:20 PM   GFRAA 70 08/19/2020 01:41 PM   GFRAA 118 08/31/2017 05:20 PM       Latest Ref Rng & Units 12/08/2022   11:37 AM 10/13/2022    3:46 PM 06/29/2022   10:06 AM  BMP  Glucose 70 - 99 mg/dL 161  81  096   BUN 8 - 23 mg/dL 18  13  16    Creatinine 0.44 - 1.00 mg/dL 0.45  4.09  8.11   BUN/Creat Ratio 12 - 28  14  15    Sodium 135 - 145 mmol/L 133  137  134   Potassium 3.5 - 5.1 mmol/L 4.6  5.2  5.6   Chloride 98 - 111 mmol/L 99  104  99   CO2 22 - 32 mmol/L 25  18  23    Calcium 8.9 - 10.3 mg/dL 9.2  9.4  91.4      Malecca [place]/[facility] CMA Clinical Pharmacist Assistant [phone]

## 2023-02-14 ENCOUNTER — Encounter: Payer: Self-pay | Admitting: Oncology

## 2023-02-15 DIAGNOSIS — Z1231 Encounter for screening mammogram for malignant neoplasm of breast: Secondary | ICD-10-CM | POA: Diagnosis not present

## 2023-02-15 LAB — HM MAMMOGRAPHY

## 2023-02-17 ENCOUNTER — Encounter: Payer: Self-pay | Admitting: Family Medicine

## 2023-02-23 ENCOUNTER — Ambulatory Visit: Payer: Medicare HMO | Admitting: Family Medicine

## 2023-02-27 ENCOUNTER — Other Ambulatory Visit: Payer: Self-pay

## 2023-02-27 DIAGNOSIS — J3089 Other allergic rhinitis: Secondary | ICD-10-CM

## 2023-02-27 DIAGNOSIS — E038 Other specified hypothyroidism: Secondary | ICD-10-CM

## 2023-02-27 MED ORDER — FLUTICASONE PROPIONATE 50 MCG/ACT NA SUSP
NASAL | 2 refills | Status: AC
Start: 2023-02-27 — End: ?

## 2023-02-27 MED ORDER — LEVOTHYROXINE SODIUM 50 MCG PO TABS
50.0000 ug | ORAL_TABLET | Freq: Every day | ORAL | 1 refills | Status: DC
Start: 2023-02-27 — End: 2024-01-29

## 2023-03-01 ENCOUNTER — Other Ambulatory Visit: Payer: Self-pay

## 2023-03-02 ENCOUNTER — Ambulatory Visit: Payer: Medicare HMO | Admitting: Family Medicine

## 2023-03-27 ENCOUNTER — Other Ambulatory Visit: Payer: Self-pay

## 2023-03-27 DIAGNOSIS — G43009 Migraine without aura, not intractable, without status migrainosus: Secondary | ICD-10-CM

## 2023-03-27 MED ORDER — BUTALBITAL-APAP-CAFFEINE 50-325-40 MG PO TABS
1.0000 | ORAL_TABLET | Freq: Four times a day (QID) | ORAL | 2 refills | Status: AC | PRN
Start: 2023-03-27 — End: ?

## 2023-03-28 ENCOUNTER — Encounter: Payer: Self-pay | Admitting: Oncology

## 2023-04-20 ENCOUNTER — Encounter: Payer: Self-pay | Admitting: Oncology

## 2023-04-27 ENCOUNTER — Encounter: Payer: Self-pay | Admitting: Family Medicine

## 2023-04-27 ENCOUNTER — Ambulatory Visit (INDEPENDENT_AMBULATORY_CARE_PROVIDER_SITE_OTHER): Payer: Medicare HMO | Admitting: Family Medicine

## 2023-04-27 VITALS — BP 110/60 | HR 72 | Temp 97.3°F | Resp 18 | Ht 62.0 in

## 2023-04-27 DIAGNOSIS — E038 Other specified hypothyroidism: Secondary | ICD-10-CM

## 2023-04-27 DIAGNOSIS — G43919 Migraine, unspecified, intractable, without status migrainosus: Secondary | ICD-10-CM | POA: Diagnosis not present

## 2023-04-27 DIAGNOSIS — E7849 Other hyperlipidemia: Secondary | ICD-10-CM | POA: Diagnosis not present

## 2023-04-27 DIAGNOSIS — I10 Essential (primary) hypertension: Secondary | ICD-10-CM

## 2023-04-27 DIAGNOSIS — S78112A Complete traumatic amputation at level between left hip and knee, initial encounter: Secondary | ICD-10-CM | POA: Diagnosis not present

## 2023-04-27 MED ORDER — KETOROLAC TROMETHAMINE 60 MG/2ML IM SOLN
60.0000 mg | Freq: Once | INTRAMUSCULAR | Status: AC
Start: 2023-04-27 — End: 2023-04-27
  Administered 2023-04-27: 60 mg via INTRAMUSCULAR

## 2023-04-27 NOTE — Patient Instructions (Signed)
Needs Tdap and shingrix at local pharmacy.

## 2023-04-27 NOTE — Progress Notes (Signed)
Subjective:  Patient ID: Anna Hayes, female    DOB: 03/20/1951  Age: 72 y.o. MRN: [id number]  Chief Complaint  Patient presents with   Medical Management of Chronic Issues    HPI Hypertension, follow-up: She was last seen for hypertension 3 months ago.  BP at that visit was 110/60. Management includes Lisinopril 20 mg QD, Nifedipine 30 mg QD. She reports excellent compliance with treatment. She is not having side effects.  She is following a Regular diet. She is not exercising. She does smoke.  Use of agents associated with hypertension: NSAIDS.  Outside blood pressures are not being checked  Lipid/Cholesterol, Follow-up Management includes Simvastatin 40 mg, Lovaza 1 gm BID She reports excellent compliance with treatment. She is not having side effects.    Hypothyroidism: Current treatment is Levothyroxine 25 mcg daily. Last TSH 1.620 on 06/29/2022. She denies any current symptoms of hypothyroidism. She is adherent to medication regimen and follow-up appointments.       04/27/2023   11:16 AM 10/13/2022    1:52 PM 07/27/2022   10:49 AM 06/29/2022    9:30 AM 07/23/2021   10:41 AM  Depression screen PHQ 2/9  Decreased Interest 0 0 0 0 0  Down, Depressed, Hopeless 0 0 0 0 0  PHQ - 2 Score 0 0 0 0 0  Altered sleeping 0  0 0   Tired, decreased energy 0  0 0   Change in appetite 0  0 0   Feeling bad or failure about yourself  0  0 0   Trouble concentrating 0  0 0   Moving slowly or fidgety/restless 0  0 0   Suicidal thoughts 0  0 0   PHQ-9 Score 0  0 0   Difficult doing work/chores Not difficult at all  Not difficult at all Not difficult at all         04/27/2023   11:16 AM  Fall Risk   Falls in the past year? 0  Number falls in past yr: 0  Injury with Fall? 0  Risk for fall due to : Impaired mobility  Follow up Falls evaluation completed;Falls prevention discussed    Patient Care Team: , Fritzi Mandes, MD as PCP - General (Family Medicine)   Review of  Systems  Constitutional:  Negative for chills, fatigue and fever.  HENT:  Positive for sneezing. Negative for congestion, rhinorrhea and sore throat.   Respiratory:  Negative for cough and shortness of breath.   Cardiovascular:  Negative for chest pain.  Gastrointestinal:  Negative for abdominal pain, constipation, diarrhea, nausea and vomiting.  Genitourinary:  Negative for dysuria and urgency.  Musculoskeletal:  Positive for back pain. Negative for myalgias.  Neurological:  Positive for weakness (left arm) and headaches. Negative for dizziness and light-headedness.  Psychiatric/Behavioral:  Negative for dysphoric mood. The patient is not nervous/anxious.     Current Outpatient Medications on File Prior to Visit  Medication Sig Dispense Refill   atenolol (TENORMIN) 50 MG tablet Take 1 tablet (50 mg total) by mouth daily. 90 tablet 1   butalbital-acetaminophen-caffeine (FIORICET) 50-325-40 MG tablet Take 1 tablet by mouth every 6 (six) hours as needed for migraine. 30 tablet 2   cyanocobalamin (,VITAMIN B-12,) 1000 MCG/ML injection Inject into the muscle.     fluticasone (FLONASE) 50 MCG/ACT nasal spray SPRAY 2 SPRAYS INTO EACH NOSTRIL EVERY DAY 48 mL 2   furosemide (LASIX) 20 MG tablet Take 20 mg by mouth daily as needed for fluid.  levothyroxine (SYNTHROID) 50 MCG tablet Take 1 tablet (50 mcg total) by mouth daily. 90 tablet 1   linaclotide (LINZESS) 145 MCG CAPS capsule TAKE 1 CAPSULE BY MOUTH EVERY DAY BEFORE BREAKFAST 90 capsule 1   lisinopril (ZESTRIL) 20 MG tablet TAKE 1 TABLET BY MOUTH EVERY DAY 90 tablet 3   NIFEdipine (PROCARDIA XL) 30 MG 24 hr tablet Take 1 tablet (30 mg total) by mouth daily. 90 tablet 1   omega-3 acid ethyl esters (LOVAZA) 1 g capsule TAKE 2 CAPSULES BY MOUTH 2 TIMES DAILY. 360 capsule 1   omeprazole (PRILOSEC) 40 MG capsule Take 1 capsule (40 mg total) by mouth daily. 90 capsule 1   ondansetron (ZOFRAN) 4 MG tablet TAKE 1 TABLET BY MOUTH EVERY 8 HOURS AS  NEEDED FOR NAUSEA AND VOMITING 90 tablet 1   rivaroxaban (XARELTO) 20 MG TABS tablet Take 1 tablet (20 mg total) by mouth daily with supper. 90 tablet 1   simvastatin (ZOCOR) 40 MG tablet Take 1 tablet (40 mg total) by mouth daily. 90 tablet 0   No current facility-administered medications on file prior to visit.   Past Medical History:  Diagnosis Date   Anemia    Constipation    DVT of lower extremity (deep venous thrombosis) (HCC)    Frequent headaches    Heart murmur    found during pregnancy -  no problems   Hypertension    Muscle pain    Palpitations    Septic thrombophlebitis 08/31/2017   Sinus problem    Stroke [facility])    Past Surgical History:  Procedure Laterality Date   AMPUTATION Left 07/05/2017   Procedure: LEFT ABOVE KNEE AMPUTATION;  Surgeon: Fransisco Hertz, MD;  Location: [facility] OR;  Service: Vascular;  Laterality: Left;   CATARACT EXTRACTION W/PHACO Right 06/12/2018   Procedure: CATARACT EXTRACTION WITH INTRAOCULAR LENS IMPLANT;  Surgeon: Carmela Rima, MD;  Location: [facility] OR;  Service: Ophthalmology;  Laterality: Right;   COLONOSCOPY  11/26/2015   Dr Jennye Boroughs - follow-up in 5 years   EMBOLECTOMY Bilateral 06/27/2017   Procedure: BILATERAL FEMORAL POPLITEAL EMBOLECTOMY;  Surgeon: Nada Libman, MD;  Location: [facility] OR;  Service: Vascular;  Laterality: Bilateral;   FASCIOTOMY Left 06/27/2017   Procedure: FULL COMPARTMENT LEFT LOWER LEG FASCIOTOMY;  Surgeon: Nada Libman, MD;  Location: [facility] OR;  Service: Vascular;  Laterality: Left;   PARTIAL HYSTERECTOMY     PATCH ANGIOPLASTY Left 06/27/2017   Procedure: PATCH ANGIOPLASTY Left Posterior Tibial Artery;  Surgeon: Nada Libman, MD;  Location: [facility] OR;  Service: Vascular;  Laterality: Left;   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 07/11/2017   Procedure: REPAIR OF COMPLEX TRACTION RETINAL DETACHMENT WITH ENDO LASER AND ANTIFUNGAL INJECTION;  Surgeon: Carmela Rima, MD;  Location: [facility] OR;  Service:  Ophthalmology;  Laterality: Right;   TEE WITHOUT CARDIOVERSION N/A 07/11/2017   Procedure: TRANSESOPHAGEAL ECHOCARDIOGRAM (TEE);  Surgeon: Elease Hashimoto Deloris Ping, MD;  Location: [facility] ENDOSCOPY;  Service: Cardiovascular;  Laterality: N/A;    Family History  Problem Relation Age of Onset   Breast cancer Mother    Prostate cancer Father    Prostate cancer Brother    Social History   Socioeconomic History   Marital status: Widowed    Spouse name: Not on file   Number of children: Not on file   Years of education: Not on file   Highest education level: Not on file  Occupational History   Not on file  Tobacco Use  Smoking status: Some Days    Current packs/day: 0.25    Average packs/day: 0.3 packs/day for 53.0 years (13.3 ttl pk-yrs)    Types: Cigarettes   Smokeless tobacco: Never  Vaping Use   Vaping status: Never Used  Substance and Sexual Activity   Alcohol use: No   Drug use: No   Sexual activity: Not Currently  Other Topics Concern   Not on file  Social History Narrative   Lives with her daughter   Social Determinants of Health   Financial Resource Strain: Low Risk  (01/11/2023)   Overall Financial Resource Strain (CARDIA)    Difficulty of Paying Living Expenses: Not hard at all  Food Insecurity: No Food Insecurity (07/23/2021)   Hunger Vital Sign    Worried About Running Out of Food in the Last Year: Never true    Ran Out of Food in the Last Year: Never true  Transportation Needs: No Transportation Needs (01/11/2023)   PRAPARE - Administrator, Civil Service (Medical): No    Lack of Transportation (Non-Medical): No  Physical Activity: Insufficiently Active (06/29/2022)   Exercise Vital Sign    Days of Exercise per Week: 1 day    Minutes of Exercise per Session: 60 min  Stress: No Stress Concern Present (06/29/2022)   [employer] of Occupational Health - Occupational Stress Questionnaire    Feeling of Stress : Not at all  Social Connections: Moderately  Isolated (06/29/2022)   Social Connection and Isolation Panel [NHANES]    Frequency of Communication with Friends and Family: More than three times a week    Frequency of Social Gatherings with Friends and Family: More than three times a week    Attends Religious Services: 1 to 4 times per year    Active Member of [employer] or Organizations: No    Attends Banker Meetings: Never    Marital Status: Widowed    Objective:  BP 110/60   Pulse 72   Temp (!) 97.3 F (36.3 C)   Resp 18   Ht 5\' 2"  (1.575 m)   BMI 35.01 kg/m      04/27/2023   11:11 AM 02/02/2023   11:42 AM 01/05/2023   10:42 AM  BP/Weight  Systolic BP 110 120 139  Diastolic BP 60 50 76  Wt. (Lbs)  191.4   BMI  34.45 kg/m2     Physical Exam Vitals reviewed.  Constitutional:      Appearance: Normal appearance. She is obese.  Neck:     Vascular: No carotid bruit.  Cardiovascular:     Rate and Rhythm: Normal rate and regular rhythm.     Heart sounds: Normal heart sounds.  Pulmonary:     Effort: Pulmonary effort is normal. No respiratory distress.     Breath sounds: Normal breath sounds.  Abdominal:     General: Abdomen is flat. Bowel sounds are normal.     Palpations: Abdomen is soft.     Tenderness: There is no abdominal tenderness.  Musculoskeletal:     Comments: Left AKA.  Neurological:     Mental Status: She is alert and oriented to person, place, and time.  Psychiatric:        Mood and Affect: Mood normal.        Behavior: Behavior normal.     Diabetic Foot Exam - Simple   No data filed      Lab Results  Component Value Date   WBC 7.4 04/27/2023  HGB 11.0 (L) 04/27/2023   HCT 35.3 04/27/2023   PLT 423 04/27/2023   GLUCOSE 92 04/27/2023   CHOL 140 04/27/2023   TRIG 157 (H) 04/27/2023   HDL 45 04/27/2023   LDLCALC 68 04/27/2023   ALT 21 04/27/2023   AST 23 04/27/2023   NA 138 04/27/2023   K 5.7 (H) 04/27/2023   CL 101 04/27/2023   CREATININE 0.90 04/27/2023   BUN 13 04/27/2023    CO2 25 04/27/2023   TSH 2.200 10/13/2022   INR 1.3 (H) 12/08/2022      Assessment & Plan:    Benign essential HTN Assessment & Plan: Well controlled.  No changes to medicines.  ContinueLisinopril 20 mg QD, Nifedipine 30 mg QD. Continue to work on eating a healthy diet and chair exercises.  Labs drawn today.   Orders: -     CBC with Differential/Platelet -     Comprehensive metabolic panel  Secondary hypothyroidism Assessment & Plan: Previously well controlled Continue Synthroid at current dose  Recheck TSH and adjust Synthroid as indicated     Unilateral AKA, left (HCC) Assessment & Plan: Stable.   Other hyperlipidemia Assessment & Plan: Well controlled.  No changes to medicines.  Continue simvastatin and Zetia. Continue to work on eating a healthy diet and exercise.  Labs drawn today.    Orders: -     Lipid panel  Intractable migraine without status migrainosus, unspecified migraine type -     Ketorolac Tromethamine     Meds ordered this encounter  Medications   ketorolac (TORADOL) injection 60 mg    Orders Placed This Encounter  Procedures   CBC with Differential/Platelet   Comprehensive metabolic panel   Lipid panel     Follow-up: Return in about 3 months (around 07/28/2023) for chronic follow up, fasting.   I,Carolyn M Morrison,acting as a Neurosurgeon for Blane Ohara, MD.,have documented all relevant documentation on the behalf of Blane Ohara, MD,as directed by  Blane Ohara, MD while in the presence of Blane Ohara, MD.   An After Visit Summary was printed and given to the patient.  Blane Ohara, MD  Family Practice [phone]

## 2023-04-30 DIAGNOSIS — S78112A Complete traumatic amputation at level between left hip and knee, initial encounter: Secondary | ICD-10-CM | POA: Insufficient documentation

## 2023-04-30 DIAGNOSIS — E7849 Other hyperlipidemia: Secondary | ICD-10-CM | POA: Insufficient documentation

## 2023-04-30 NOTE — Assessment & Plan Note (Signed)
Well controlled.  No changes to medicines.  Continue simvastatin and Zetia. Continue to work on eating a healthy diet and exercise.  Labs drawn today.

## 2023-04-30 NOTE — Assessment & Plan Note (Signed)
Previously well controlled Continue Synthroid at current dose  Recheck TSH and adjust Synthroid as indicated   

## 2023-04-30 NOTE — Assessment & Plan Note (Signed)
Stable

## 2023-04-30 NOTE — Assessment & Plan Note (Signed)
Well controlled.  No changes to medicines.  ContinueLisinopril 20 mg QD, Nifedipine 30 mg QD. Continue to work on eating a healthy diet and chair exercises.  Labs drawn today.

## 2023-05-04 ENCOUNTER — Inpatient Hospital Stay: Payer: Medicare HMO | Attending: Oncology | Admitting: Oncology

## 2023-05-04 ENCOUNTER — Other Ambulatory Visit: Payer: Self-pay

## 2023-05-04 ENCOUNTER — Inpatient Hospital Stay: Payer: Medicare HMO

## 2023-05-04 MED ORDER — RIVAROXABAN 20 MG PO TABS: 20.0000 mg | ORAL_TABLET | Freq: Every day | ORAL | 1 refills | Status: DC

## 2023-05-04 NOTE — Progress Notes (Deleted)
[facility] Cancer Follow up Visit:  Patient Care Team: Blane Ohara, MD as PCP - General (Family Medicine)  CHIEF COMPLAINTS/PURPOSE OF CONSULTATION:   HISTORY OF PRESENTING ILLNESS: Anna Hayes 72 y.o. female is here because of anemia Medical history notable for lower extremity DVT, heart murmur, hypertension, myalgias, palpitations, septic thrombophlebitis, CVA, left AKA due to peripheral vascular disease with resultant bilateral femoral artery embolus, fasciotomy, chronic anticoagulation with Xarelto  December 06, 2021: Colonoscopy demonstrated 10 right colon polyps and 9 left colon polyps 0.2 to 0.7 cm in diameter which were removed.  A few diverticula in right colon.  Follow-up colonoscopy recommended in 1 year  October 13, 2022:: WBC 6.8 hemoglobin 10.8 MCV 79 platelet count 474; 51 segs 33 lymphs 12 monos 3 eos 1 basophil Ferritin 32 CMP notable for CO2 of 18  December 08, 2022: [facility] Hematology Consult  Patient is on Xarelto because of prior history of CVA and PVD.  She is not taking oral iron.  Has never received PRBC's nor IV iron. Has not yet been called by GI for follow up colonoscopy.  No history of EGD.  Takes tylenol for pain but no NSAIDS.   No pica to ice, uncooked starches Does not want to see a geneticist regarding family history of cancer.    Social:  Widowed.  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  Tobacco claims 1/2 ppd but has smoked up to 2 ppd.  EtOH none  [facility] Mother died 6 breast cancer Father died 57 prostate cancer Sister alive 71 HTN Sister alive 48's HTN Brother died 41 prostate cancer  WBC 7.5 hemoglobin 11.3 MCV 81 platelet count 490; 49 segs 32 lymphs 12 monos 6 eos 1 basophil.  Reticulocyte count 1.4%  Hemoglobin electrophoresis showed 91.2% hemoglobin A hemoglobin 8 to 4.5% hemoglobin F 4.3% felt to be consistent with beta thalassemia minor.   INR 1.3 PTT 32 fibrinogen 518 Coombs test negative.   Haptoglobin 234 SPEP with IEP showed no paraprotein.  Serum free kappa 30.2 lambda 14.1 with a kappa lambda 2.14 IgG 1216 IgA 324 IgM 21 Ferritin 13 folate 21.6 B12 488 Copper 142 zinc 73 CMP notable for sodium 133 glucose 107  December 21, 2022 through January 04, 2023: Received total of 1000 mg of Venofer  January 05, 2023:  Reviewed results of labs with patient and daughter.  Still fatigued.  Tolerated IV iron well.   Hgb 11.5 Ferritin 13. Folate 21.6  Feb 02 2023:  Scheduled follow-up for anemia.  Still smoking - no more and no less.  Not taking oral iron.   Hemoglobin 10.8 MCV 81  Review of Systems  Constitutional:  Negative for appetite change, chills, fatigue and fever.       Has been gaining weight  HENT:   Negative for mouth sores, nosebleeds, sore throat and trouble swallowing.   Eyes:  Negative for eye problems and icterus.       Vision changes:  None  Respiratory:  Negative for chest tightness, cough, hemoptysis, shortness of breath and wheezing.        PND:  none Orthopnea:  none DOE:    Cardiovascular:  Negative for chest pain, leg swelling and palpitations.       PND:  none Orthopnea:  none  Gastrointestinal:  Positive for constipation and nausea. Negative for abdominal pain, blood in stool, diarrhea and vomiting.  Endocrine: Negative for hot flashes.       Cold intolerance:  none Heat intolerance:  none  Genitourinary:  Negative for bladder incontinence, difficulty urinating, dysuria, frequency, hematuria and nocturia.   Musculoskeletal:  Positive for gait problem. Negative for arthralgias, back pain, myalgias, neck pain and neck stiffness.  Skin:  Negative for itching, rash and wound.  Neurological:  Positive for gait problem and headaches. Negative for dizziness, extremity weakness, light-headedness, numbness, seizures and speech difficulty.  Hematological:  Negative for adenopathy. Does not bruise/bleed easily.  Psychiatric/Behavioral:  Negative for suicidal ideas. The  patient is not nervous/anxious.     MEDICAL HISTORY: Past Medical History:  Diagnosis Date  . Anemia   . Constipation   . DVT of lower extremity (deep venous thrombosis) (HCC)   . Frequent headaches   . Heart murmur    found during pregnancy -  no problems  . Hypertension   . Muscle pain   . Palpitations   . Septic thrombophlebitis 08/31/2017  . Sinus problem   . Stroke [facility])     SURGICAL HISTORY: Past Surgical History:  Procedure Laterality Date  . AMPUTATION Left 07/05/2017   Procedure: LEFT ABOVE KNEE AMPUTATION;  Surgeon: Fransisco Hertz, MD;  Location: [facility] OR;  Service: Vascular;  Laterality: Left;  . CATARACT EXTRACTION W/PHACO Right 06/12/2018   Procedure: CATARACT EXTRACTION WITH INTRAOCULAR LENS IMPLANT;  Surgeon: Carmela Rima, MD;  Location: [facility] OR;  Service: Ophthalmology;  Laterality: Right;  . COLONOSCOPY  11/26/2015   Dr Jennye Boroughs - follow-up in 5 years  . EMBOLECTOMY Bilateral 06/27/2017   Procedure: BILATERAL FEMORAL POPLITEAL EMBOLECTOMY;  Surgeon: Nada Libman, MD;  Location: [facility] OR;  Service: Vascular;  Laterality: Bilateral;  . FASCIOTOMY Left 06/27/2017   Procedure: FULL COMPARTMENT LEFT LOWER LEG FASCIOTOMY;  Surgeon: Nada Libman, MD;  Location: [facility] OR;  Service: Vascular;  Laterality: Left;  . PARTIAL HYSTERECTOMY    . PATCH ANGIOPLASTY Left 06/27/2017   Procedure: PATCH ANGIOPLASTY Left Posterior Tibial Artery;  Surgeon: Nada Libman, MD;  Location: [facility] OR;  Service: Vascular;  Laterality: Left;  . 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 07/11/2017   Procedure: REPAIR OF COMPLEX TRACTION RETINAL DETACHMENT WITH ENDO LASER AND ANTIFUNGAL INJECTION;  Surgeon: Carmela Rima, MD;  Location: [facility] OR;  Service: Ophthalmology;  Laterality: Right;  . TEE WITHOUT CARDIOVERSION N/A 07/11/2017   Procedure: TRANSESOPHAGEAL ECHOCARDIOGRAM (TEE);  Surgeon: Elease Hashimoto Deloris Ping, MD;  Location: [facility] ENDOSCOPY;  Service: Cardiovascular;  Laterality: N/A;     SOCIAL HISTORY: Social History   Socioeconomic History  . Marital status: Widowed    Spouse name: Not on file  . Number of children: Not on file  . Years of education: Not on file  . Highest education level: Not on file  Occupational History  . Not on file  Tobacco Use  . Smoking status: Some Days    Current packs/day: 0.25    Average packs/day: 0.3 packs/day for 53.0 years (13.3 ttl pk-yrs)    Types: Cigarettes  . Smokeless tobacco: Never  Vaping Use  . Vaping status: Never Used  Substance and Sexual Activity  . Alcohol use: No  . Drug use: No  . Sexual activity: Not Currently  Other Topics Concern  . Not on file  Social History Narrative   Lives with her daughter   Social Determinants of Health   Financial Resource Strain: Low Risk  (01/11/2023)   Overall Financial Resource Strain (CARDIA)   . Difficulty of Paying Living Expenses: Not hard at all  Food Insecurity: No Food  Insecurity (07/23/2021)   Hunger Vital Sign   . Worried About Programme researcher, broadcasting/film/video in the Last Year: Never true   . Ran Out of Food in the Last Year: Never true  Transportation Needs: No Transportation Needs (01/11/2023)   PRAPARE - Transportation   . Lack of Transportation (Medical): No   . Lack of Transportation (Non-Medical): No  Physical Activity: Insufficiently Active (06/29/2022)   Exercise Vital Sign   . Days of Exercise per Week: 1 day   . Minutes of Exercise per Session: 60 min  Stress: No Stress Concern Present (06/29/2022)   [employer] of Occupational Health - Occupational Stress Questionnaire   . Feeling of Stress : Not at all  Social Connections: Moderately Isolated (06/29/2022)   Social Connection and Isolation Panel [NHANES]   . Frequency of Communication with Friends and Family: More than three times a week   . Frequency of Social Gatherings with Friends and Family: More than three times a week   . Attends Religious Services: 1 to 4 times per year   . Active Member of  Clubs or Organizations: No   . Attends Banker Meetings: Never   . Marital Status: Widowed  Intimate Partner Violence: Not At Risk (06/29/2022)   Humiliation, Afraid, Rape, and Kick questionnaire   . Fear of Current or Ex-Partner: No   . Emotionally Abused: No   . Physically Abused: No   . Sexually Abused: No    FAMILY HISTORY Family History  Problem Relation Age of Onset  . Breast cancer Mother   . Prostate cancer Father   . Prostate cancer Brother     ALLERGIES:  is allergic to latex, codeine, lyrica [pregabalin], nurtec [rimegepant sulfate], penicillins, amlodipine, aspirin, and sulfa antibiotics.  MEDICATIONS:  Current Outpatient Medications  Medication Sig Dispense Refill  . atenolol (TENORMIN) 50 MG tablet Take 1 tablet (50 mg total) by mouth daily. 90 tablet 1  . butalbital-acetaminophen-caffeine (FIORICET) 50-325-40 MG tablet Take 1 tablet by mouth every 6 (six) hours as needed for migraine. 30 tablet 2  . cyanocobalamin (,VITAMIN B-12,) 1000 MCG/ML injection Inject into the muscle.    . fluticasone (FLONASE) 50 MCG/ACT nasal spray SPRAY 2 SPRAYS INTO EACH NOSTRIL EVERY DAY 48 mL 2  . furosemide (LASIX) 20 MG tablet Take 20 mg by mouth daily as needed for fluid.     Marland Kitchen levothyroxine (SYNTHROID) 50 MCG tablet Take 1 tablet (50 mcg total) by mouth daily. 90 tablet 1  . linaclotide (LINZESS) 145 MCG CAPS capsule TAKE 1 CAPSULE BY MOUTH EVERY DAY BEFORE BREAKFAST 90 capsule 1  . lisinopril (ZESTRIL) 20 MG tablet TAKE 1 TABLET BY MOUTH EVERY DAY 90 tablet 3  . NIFEdipine (PROCARDIA XL) 30 MG 24 hr tablet Take 1 tablet (30 mg total) by mouth daily. 90 tablet 1  . omega-3 acid ethyl esters (LOVAZA) 1 g capsule TAKE 2 CAPSULES BY MOUTH 2 TIMES DAILY. 360 capsule 1  . omeprazole (PRILOSEC) 40 MG capsule Take 1 capsule (40 mg total) by mouth daily. 90 capsule 1  . ondansetron (ZOFRAN) 4 MG tablet TAKE 1 TABLET BY MOUTH EVERY 8 HOURS AS NEEDED FOR NAUSEA AND VOMITING 90  tablet 1  . rivaroxaban (XARELTO) 20 MG TABS tablet Take 1 tablet (20 mg total) by mouth daily with supper. 90 tablet 1  . simvastatin (ZOCOR) 40 MG tablet Take 1 tablet (40 mg total) by mouth daily. 90 tablet 0   No current facility-administered medications for this  visit.    PHYSICAL EXAMINATION:  ECOG PERFORMANCE STATUS: 1 - Symptomatic but completely ambulatory   There were no vitals filed for this visit.   There were no vitals filed for this visit.    Physical Exam Vitals and nursing note reviewed.  Constitutional:      General: She is not in acute distress.    Appearance: Normal appearance. She is obese. She is not toxic-appearing or diaphoretic.     Comments: Here with daughter.  Seated in wheelchair  HENT:     Head: Normocephalic and atraumatic.     Right Ear: External ear normal.     Left Ear: External ear normal.     Nose: Nose normal. No congestion or rhinorrhea.  Eyes:     General: No scleral icterus.    Extraocular Movements: Extraocular movements intact.     Conjunctiva/sclera: Conjunctivae normal.     Pupils: Pupils are equal, round, and reactive to light.  Cardiovascular:     Rate and Rhythm: Normal rate.     Heart sounds: No murmur heard.    No friction rub. No gallop.  Abdominal:     General: Bowel sounds are normal.     Palpations: Abdomen is soft.  Musculoskeletal:        General: No swelling or tenderness.     Cervical back: Normal range of motion and neck supple. No rigidity or tenderness.     Comments: Left AKA  Lymphadenopathy:     Head:     Right side of head: No submental, submandibular, tonsillar, preauricular, posterior auricular or occipital adenopathy.     Left side of head: No submental, submandibular, tonsillar, preauricular, posterior auricular or occipital adenopathy.     Cervical: No cervical adenopathy.     Right cervical: No superficial, deep or posterior cervical adenopathy.    Left cervical: No superficial, deep or posterior  cervical adenopathy.     Upper Body:     Right upper body: No supraclavicular, axillary, pectoral or epitrochlear adenopathy.     Left upper body: No supraclavicular, axillary, pectoral or epitrochlear adenopathy.  Skin:    General: Skin is warm.     Coloration: Skin is not jaundiced.  Neurological:     General: No focal deficit present.     Mental Status: She is alert and oriented to person, place, and time.     Cranial Nerves: No cranial nerve deficit.     Motor: Weakness present.     Comments: Left hemiparesis  Psychiatric:        Mood and Affect: Mood normal.        Behavior: Behavior normal.        Thought Content: Thought content normal.        Judgment: Judgment normal.    LABORATORY DATA: I have personally reviewed the data as listed:  Office Visit on 04/27/2023  Component Date Value Ref Range Status  . WBC 04/27/2023 7.4  3.4 - 10.8 x10E3/uL Final  . RBC 04/27/2023 4.39  3.77 - 5.28 x10E6/uL Final  . Hemoglobin 04/27/2023 11.0 (L)  11.1 - 15.9 g/dL Final  . Hematocrit 13/24/4010 35.3  34.0 - 46.6 % Final  . MCV 04/27/2023 80  79 - 97 fL Final  . MCH 04/27/2023 25.1 (L)  26.6 - 33.0 pg Final  . MCHC 04/27/2023 31.2 (L)  31.5 - 35.7 g/dL Final  . RDW 27/25/3664 15.7 (H)  11.7 - 15.4 % Final  . Platelets 04/27/2023 423  150 -  450 x10E3/uL Final  . Neutrophils 04/27/2023 57  Not Estab. % Final  . Lymphs 04/27/2023 29  Not Estab. % Final  . Monocytes 04/27/2023 9  Not Estab. % Final  . Eos 04/27/2023 5  Not Estab. % Final  . Basos 04/27/2023 0  Not Estab. % Final  . Neutrophils Absolute 04/27/2023 4.2  1.4 - 7.0 x10E3/uL Final  . Lymphocytes Absolute 04/27/2023 2.2  0.7 - 3.1 x10E3/uL Final  . Monocytes Absolute 04/27/2023 0.7  0.1 - 0.9 x10E3/uL Final  . EOS (ABSOLUTE) 04/27/2023 0.4  0.0 - 0.4 x10E3/uL Final  . Basophils Absolute 04/27/2023 0.0  0.0 - 0.2 x10E3/uL Final  . Immature Granulocytes 04/27/2023 0  Not Estab. % Final  . Immature Grans (Abs) 04/27/2023 0.0   0.0 - 0.1 x10E3/uL Final  . Glucose 04/27/2023 92  70 - 99 mg/dL Final  . BUN 04/54/0981 13  8 - 27 mg/dL Final  . Creatinine, Ser 04/27/2023 0.90  0.57 - 1.00 mg/dL Final  . eGFR 19/14/7829 67  >59 mL/min/1.73 Final  . BUN/Creatinine Ratio 04/27/2023 14  12 - 28 Final  . Sodium 04/27/2023 138  134 - 144 mmol/L Final  . Potassium 04/27/2023 5.7 (H)  3.5 - 5.2 mmol/L Final  . Chloride 04/27/2023 101  96 - 106 mmol/L Final  . CO2 04/27/2023 25  20 - 29 mmol/L Final  . Calcium 04/27/2023 9.9  8.7 - 10.3 mg/dL Final  . Total Protein 04/27/2023 7.5  6.0 - 8.5 g/dL Final  . Albumin 56/21/3086 4.5  3.8 - 4.8 g/dL Final  . Globulin, Total 04/27/2023 3.0  1.5 - 4.5 g/dL Final  . Bilirubin Total 04/27/2023 <0.2  0.0 - 1.2 mg/dL Final  . Alkaline Phosphatase 04/27/2023 84  44 - 121 IU/L Final  . AST 04/27/2023 23  0 - 40 IU/L Final  . ALT 04/27/2023 21  0 - 32 IU/L Final  . Cholesterol, Total 04/27/2023 140  100 - 199 mg/dL Final  . Triglycerides 04/27/2023 157 (H)  0 - 149 mg/dL Final  . HDL 57/84/6962 45  >39 mg/dL Final  . VLDL Cholesterol Cal 04/27/2023 27  5 - 40 mg/dL Final  . LDL Chol Calc (NIH) 04/27/2023 68  0 - 99 mg/dL Final  . Chol/HDL Ratio 04/27/2023 3.1  0.0 - 4.4 ratio Final   Comment:                                   T. Chol/HDL Ratio                                             Men  Women                               1/2 Avg.Risk  3.4    3.3                                   Avg.Risk  5.0    4.4                                2X Avg.Risk  9.6    7.1                                3X Avg.Risk 23.4   11.0     RADIOGRAPHIC STUDIES: I have personally reviewed the radiological images as listed and agree with the findings in the report  No results found.  ASSESSMENT/PLAN  Anemia:  Etiology multifactorial 1) Iron deficiency from occult GI blood loss (known polyps) exacerbated by anticoagulant therapy 2) CKD 3) Chronic inflammation  December 21, 2022 through January 04, 2023-  Received total of 1000 mg of Venofer  Feb 02, 2023- Hemoglobin 10.8 MCV 81.  Patient not taking oral iron.  Instructed her to begin Flintstone's vitamin with iron, nature made or solgar gentle iron Either daily or every other day.    Arterial thrombosis-  Has history of CVA and limb loss.  Risk factors are DM Type II, Tobacco use December 08 2022- On chronic anticoagulation with Eliquis.  A change to antiplatelet therapy may be appropriate.  Smoking cessation would be very beneficial in terms of risk reduction Feb 02 2023- Continues on Xarelto  Tobacco use  Feb 02 2023- Discussed smoking cessation  Family history of malignancy  December 08 2022-- Offered appointment with Genetics.  Patient declined   Cancer Staging  No matching staging information was found for the patient.    No problem-specific Assessment & Plan notes found for this encounter.    No orders of the defined types were placed in this encounter.   30  minutes was spent in patient care.  This included time spent preparing to see the patient (e.g., review of tests), obtaining and/or reviewing separately obtained history, counseling and educating the patient/family/caregiver, ordering medications, tests, or procedures; documenting clinical information in the electronic or other health record, independently interpreting results and communicating results to the patient/family/caregiver as well as coordination of care.       All questions were answered. The patient knows to call the clinic with any problems, questions or concerns.  This note was electronically signed.    Loni Muse, MD  05/04/2023 8:37 AM

## 2023-05-08 ENCOUNTER — Other Ambulatory Visit: Payer: Self-pay

## 2023-05-08 ENCOUNTER — Other Ambulatory Visit: Payer: Self-pay | Admitting: Physician Assistant

## 2023-05-08 DIAGNOSIS — E782 Mixed hyperlipidemia: Secondary | ICD-10-CM

## 2023-05-08 MED ORDER — LOSARTAN POTASSIUM 50 MG PO TABS: 50.0000 mg | ORAL_TABLET | Freq: Every day | ORAL | 1 refills | Status: DC

## 2023-05-25 ENCOUNTER — Other Ambulatory Visit: Payer: Self-pay

## 2023-05-25 MED ORDER — LINACLOTIDE 145 MCG PO CAPS: ORAL_CAPSULE | ORAL | 1 refills | Status: DC

## 2023-05-26 ENCOUNTER — Other Ambulatory Visit: Payer: Self-pay

## 2023-05-31 ENCOUNTER — Other Ambulatory Visit: Payer: Self-pay

## 2023-06-19 ENCOUNTER — Other Ambulatory Visit: Payer: Self-pay

## 2023-06-19 DIAGNOSIS — E782 Mixed hyperlipidemia: Secondary | ICD-10-CM

## 2023-06-19 MED ORDER — SIMVASTATIN 40 MG PO TABS
40.0000 mg | ORAL_TABLET | Freq: Every day | ORAL | 1 refills | Status: DC
Start: 2023-06-19 — End: 2024-01-29

## 2023-06-27 ENCOUNTER — Other Ambulatory Visit: Payer: Self-pay

## 2023-06-27 DIAGNOSIS — G43009 Migraine without aura, not intractable, without status migrainosus: Secondary | ICD-10-CM

## 2023-06-27 DIAGNOSIS — I10 Essential (primary) hypertension: Secondary | ICD-10-CM

## 2023-06-27 MED ORDER — NIFEDIPINE ER OSMOTIC RELEASE 30 MG PO TB24: 30.0000 mg | ORAL_TABLET | Freq: Every day | ORAL | 1 refills | Status: DC

## 2023-06-27 MED ORDER — BUTALBITAL-APAP-CAFFEINE 50-325-40 MG PO TABS
1.0000 | ORAL_TABLET | Freq: Four times a day (QID) | ORAL | 2 refills | Status: DC | PRN
Start: 2023-06-27 — End: 2023-09-25

## 2023-06-30 ENCOUNTER — Other Ambulatory Visit: Payer: Self-pay

## 2023-06-30 DIAGNOSIS — I10 Essential (primary) hypertension: Secondary | ICD-10-CM

## 2023-06-30 MED ORDER — NIFEDIPINE ER OSMOTIC RELEASE 30 MG PO TB24: 30.0000 mg | ORAL_TABLET | Freq: Every day | ORAL | 1 refills | Status: DC

## 2023-07-01 ENCOUNTER — Encounter: Payer: Self-pay | Admitting: Oncology

## 2023-07-30 NOTE — Progress Notes (Unsigned)
Subjective:   Anna Hayes is a 72 y.o. female who presents for Medicare Annual (Subsequent) preventive examination.  Visit Complete: In person  Patient Medicare AWV questionnaire was completed by the patient; I have confirmed that all information answered by patient is correct and no changes since this date.  Cardiac Risk Factors include: advanced age (>79men, >23 women);dyslipidemia;hypertension  Hypertension, follow-up: She was last seen for hypertension 3 months ago.  BP at that visit was 110/60.  Management includes Losartan 50 mg QD, Nifedipine 30 mg every day, atenolol 50 mg daily. .  Lipid/Cholesterol, Follow-up Management includes Simvastatin 40 mg before bed, Lovaza 1 gm 2 capsules BID She reports excellent compliance with treatment.  Hypothyroidism: Current treatment is Levothyroxine 50 mcg daily. Last TSH 2.20 on 09/2022. She denies any current symptoms of hypothyroidism. She is adherent to medication regimen and follow-up appointments.   Left hemiparalysis due to stroke.   Left Above the Knee amputation due to embolus. Done in 06/2017. Had BL femoral emboli underwent BL embolectomy, but left foot was not viable and had to undergo the Left AKA. On xarelto 20 mg daily.      04/27/2023   11:16 AM 10/13/2022    1:52 PM 07/27/2022   10:49 AM 06/29/2022    9:30 AM 07/23/2021   10:41 AM  Depression screen PHQ 2/9  Decreased Interest 0 0 0 0 0  Down, Depressed, Hopeless 0 0 0 0 0  PHQ - 2 Score 0 0 0 0 0  Altered sleeping 0  0 0   Tired, decreased energy 0  0 0   Change in appetite 0  0 0   Feeling bad or failure about yourself  0  0 0   Trouble concentrating 0  0 0   Moving slowly or fidgety/restless 0  0 0   Suicidal thoughts 0  0 0   PHQ-9 Score 0  0 0   Difficult doing work/chores Not difficult at all  Not difficult at all Not difficult at all         04/27/2023   11:16 AM  Fall Risk   Falls in the past year? 0  Number falls in past yr: 0  Injury with  Fall? 0  Risk for fall due to : Impaired mobility  Follow up Falls evaluation completed;Falls prevention discussed    Patient Care Team: Dyllan Hughett, Fritzi Mandes, MD as PCP - General (Family Medicine)   Review of Systems  Constitutional:  Negative for chills, fatigue and fever.  HENT:  Positive for sneezing. Negative for congestion, rhinorrhea and sore throat.   Respiratory:  Negative for cough and shortness of breath.   Cardiovascular:  Negative for chest pain.  Gastrointestinal:  Negative for abdominal pain, constipation, diarrhea, nausea and vomiting.  Genitourinary:  Negative for dysuria and urgency.  Musculoskeletal:  Positive for back pain. Negative for myalgias.  Neurological:  Positive for weakness (left arm) and headaches. Negative for dizziness and light-headedness.  Psychiatric/Behavioral:  Negative for dysphoric mood. The patient is not nervous/anxious.     Current Outpatient Medications on File Prior to Visit  Medication Sig Dispense Refill   atenolol (TENORMIN) 50 MG tablet Take 1 tablet (50 mg total) by mouth daily. 90 tablet 1   butalbital-acetaminophen-caffeine (FIORICET) 50-325-40 MG tablet Take 1 tablet by mouth every 6 (six) hours as needed for migraine. 30 tablet 2   cyanocobalamin (,VITAMIN B-12,) 1000 MCG/ML injection Inject into the muscle.     fluticasone (FLONASE) 50 MCG/ACT  nasal spray SPRAY 2 SPRAYS INTO EACH NOSTRIL EVERY DAY 48 mL 2   furosemide (LASIX) 20 MG tablet Take 20 mg by mouth daily as needed for fluid.      levothyroxine (SYNTHROID) 50 MCG tablet Take 1 tablet (50 mcg total) by mouth daily. 90 tablet 1   linaclotide (LINZESS) 145 MCG CAPS capsule TAKE 1 CAPSULE BY MOUTH EVERY DAY BEFORE BREAKFAST 90 capsule 1   losartan (COZAAR) 50 MG tablet Take 1 tablet (50 mg total) by mouth daily. 90 tablet 1   NIFEdipine (PROCARDIA XL) 30 MG 24 hr tablet Take 1 tablet (30 mg total) by mouth daily. 90 tablet 1   omega-3 acid ethyl esters (LOVAZA) 1 g capsule TAKE 2  CAPSULES BY MOUTH 2 TIMES DAILY. 360 capsule 1   omeprazole (PRILOSEC) 40 MG capsule Take 1 capsule (40 mg total) by mouth daily. 90 capsule 1   ondansetron (ZOFRAN) 4 MG tablet TAKE 1 TABLET BY MOUTH EVERY 8 HOURS AS NEEDED FOR NAUSEA AND VOMITING 90 tablet 1   rivaroxaban (XARELTO) 20 MG TABS tablet Take 1 tablet (20 mg total) by mouth daily with supper. 90 tablet 1   simvastatin (ZOCOR) 40 MG tablet Take 1 tablet (40 mg total) by mouth daily. 90 tablet 1   No current facility-administered medications on file prior to visit.   Past Medical History:  Diagnosis Date   Anemia    Constipation    DVT of lower extremity (deep venous thrombosis) (HCC)    Frequent headaches    Heart murmur    found during pregnancy -  no problems   Hypertension    Muscle pain    Palpitations    Septic thrombophlebitis 08/31/2017   Sinus problem    Stroke [facility])    Past Surgical History:  Procedure Laterality Date   ABDOMINAL EXPLORATION SURGERY  05/2017   LOA and release of SBO, repair of small bowel perforation.   AMPUTATION Left 07/05/2017   Procedure: LEFT ABOVE KNEE AMPUTATION;  Surgeon: Fransisco Hertz, MD;  Location: [facility] OR;  Service: Vascular;  Laterality: Left;   CATARACT EXTRACTION W/PHACO Right 06/12/2018   Procedure: CATARACT EXTRACTION WITH INTRAOCULAR LENS IMPLANT;  Surgeon: Carmela Rima, MD;  Location: [facility] OR;  Service: Ophthalmology;  Laterality: Right;   COLONOSCOPY  11/26/2015   Dr Jennye Boroughs - follow-up in 5 years   EMBOLECTOMY Bilateral 06/27/2017   Procedure: BILATERAL FEMORAL POPLITEAL EMBOLECTOMY;  Surgeon: Nada Libman, MD;  Location: [facility] OR;  Service: Vascular;  Laterality: Bilateral;   FASCIOTOMY Left 06/27/2017   Procedure: FULL COMPARTMENT LEFT LOWER LEG FASCIOTOMY;  Surgeon: Nada Libman, MD;  Location: [facility] OR;  Service: Vascular;  Laterality: Left;   PARTIAL HYSTERECTOMY     PATCH ANGIOPLASTY Left 06/27/2017   Procedure: PATCH ANGIOPLASTY Left Posterior Tibial Artery;   Surgeon: Nada Libman, MD;  Location: [facility] OR;  Service: Vascular;  Laterality: Left;   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 07/11/2017   Procedure: REPAIR OF COMPLEX TRACTION RETINAL DETACHMENT WITH ENDO LASER AND ANTIFUNGAL INJECTION;  Surgeon: Carmela Rima, MD;  Location: [facility] OR;  Service: Ophthalmology;  Laterality: Right;   TEE WITHOUT CARDIOVERSION N/A 07/11/2017   Procedure: TRANSESOPHAGEAL ECHOCARDIOGRAM (TEE);  Surgeon: Elease Hashimoto Deloris Ping, MD;  Location: [facility] ENDOSCOPY;  Service: Cardiovascular;  Laterality: N/A;    Family History  Problem Relation Age of Onset   Breast cancer Mother    Prostate cancer Father    Prostate cancer Brother  Social History   Socioeconomic History   Marital status: Widowed    Spouse name: Not on file   Number of children: Not on file   Years of education: Not on file   Highest education level: Not on file  Occupational History   Not on file  Tobacco Use   Smoking status: Some Days    Current packs/day: 0.25    Average packs/day: 0.3 packs/day for 53.0 years (13.3 ttl pk-yrs)    Types: Cigarettes   Smokeless tobacco: Never  Vaping Use   Vaping status: Never Used  Substance and Sexual Activity   Alcohol use: No   Drug use: No   Sexual activity: Not Currently  Other Topics Concern   Not on file  Social History Narrative   Lives with her daughter   Social Determinants of Health   Financial Resource Strain: Low Risk  (07/31/2023)   Overall Financial Resource Strain (CARDIA)    Difficulty of Paying Living Expenses: Not hard at all  Food Insecurity: No Food Insecurity (07/31/2023)   Hunger Vital Sign    Worried About Running Out of Food in the Last Year: Never true    Ran Out of Food in the Last Year: Never true  Transportation Needs: No Transportation Needs (01/11/2023)   PRAPARE - Administrator, Civil Service (Medical): No    Lack of Transportation (Non-Medical): No  Physical Activity: Insufficiently Active  (07/31/2023)   Exercise Vital Sign    Days of Exercise per Week: 1 day    Minutes of Exercise per Session: 60 min  Stress: No Stress Concern Present (07/31/2023)   [employer] of Occupational Health - Occupational Stress Questionnaire    Feeling of Stress : Not at all  Social Connections: Moderately Isolated (07/31/2023)   Social Connection and Isolation Panel [NHANES]    Frequency of Communication with Friends and Family: More than three times a week    Frequency of Social Gatherings with Friends and Family: More than three times a week    Attends Religious Services: 1 to 4 times per year    Active Member of [employer] or Organizations: No    Attends Banker Meetings: Never    Marital Status: Widowed    Objective:  BP 130/72   Pulse 69   Temp 97.6 F (36.4 C)   Ht 5' 2.5" (1.588 m)   Wt 190 lb (86.2 kg)   SpO2 99%   BMI 34.20 kg/m      07/31/2023   10:33 AM 04/27/2023   11:11 AM 02/02/2023   11:42 AM  BP/Weight  Systolic BP 130 110 120  Diastolic BP 72 60 50  Wt. (Lbs) 190  191.4  BMI 34.2 kg/m2  34.45 kg/m2    Physical Exam Vitals reviewed.  Constitutional:      Appearance: Normal appearance. She is obese.  Neck:     Vascular: No carotid bruit.  Cardiovascular:     Rate and Rhythm: Normal rate and regular rhythm.     Heart sounds: Normal heart sounds.  Pulmonary:     Effort: Pulmonary effort is normal. No respiratory distress.     Breath sounds: Normal breath sounds.  Abdominal:     General: Abdomen is flat. Bowel sounds are normal.     Palpations: Abdomen is soft.     Tenderness: There is no abdominal tenderness.  Musculoskeletal:     Comments: Left AKA.  Neurological:     Mental Status: She is alert  and oriented to person, place, and time.  Psychiatric:        Mood and Affect: Mood normal.        Behavior: Behavior normal.     Diabetic Foot Exam - Simple   No data filed      Lab Results  Component Value Date   WBC 6.1 07/31/2023    HGB 10.9 (L) 07/31/2023   HCT 35.5 07/31/2023   PLT 473 (H) 07/31/2023   GLUCOSE 90 07/31/2023   CHOL 143 07/31/2023   TRIG 124 07/31/2023   HDL 52 07/31/2023   LDLCALC 69 07/31/2023   ALT 21 07/31/2023   AST 20 07/31/2023   NA 136 07/31/2023   K 5.4 (H) 07/31/2023   CL 100 07/31/2023   CREATININE 0.85 07/31/2023   BUN 14 07/31/2023   CO2 22 07/31/2023   TSH 2.200 10/13/2022   INR 1.3 (H) 12/08/2022           07/31/2023   10:35 AM 12/30/2022    1:54 PM 12/28/2022    1:57 PM 12/23/2022    2:26 PM 12/21/2022    2:59 PM 12/08/2022   10:53 AM 07/27/2022   10:49 AM  Advanced Directives  Does Patient Have a Medical Advance Directive? No No No No No No No  Would patient like information on creating a medical advance directive? No - Patient declined No - Patient declined No - Patient declined    No - Patient declined    Allergies (verified) Latex, Codeine, Lyrica [pregabalin], Nurtec [rimegepant sulfate], Penicillins, Amlodipine, Aspirin, and Sulfa antibiotics     Family History  Problem Relation Age of Onset   Breast cancer Mother    Prostate cancer Father    Prostate cancer Brother     Tobacco Counseling Ready to quit: No Counseling given: yes   Clinical Intake:  Pre-visit preparation completed: No  Pain : No/denies pain Pain Score: 0-No pain     Nutritional Status: BMI > 30  Obese Diabetes: No  How often do you need to have someone help you when you read instructions, pamphlets, or other written materials from your doctor or pharmacy?: 1 - Never  Interpreter Needed?: No      Activities of Daily Living    07/31/2023   10:35 AM 10/13/2022    1:52 PM  In your present state of health, do you have any difficulty performing the following activities:  Hearing? 0 0  Vision? 0 0  Difficulty concentrating or making decisions? 0 0  Walking or climbing stairs? 1 0  Comment Uses a wheelchair   Dressing or bathing? 0 0  Doing errands, shopping? 1 0  Comment  Duaghter drives patient   Quarry manager and eating ? N   Using the Toilet? N   In the past six months, have you accidently leaked urine? N   Do you have problems with loss of bowel control? N   Managing your Medications? N   Managing your Finances? N   Housekeeping or managing your Housekeeping? N     Patient Care Team: CoxFritzi Mandes, MD as PCP - General (Family Medicine)  Indicate any recent Medical Services you may have received from other than [facility] providers in the past year (date may be approximate).  Hearing/Vision screen No results found.   Depression Screen    04/27/2023   11:16 AM 10/13/2022    1:52 PM 07/27/2022   10:49 AM 06/29/2022    9:30 AM 07/23/2021   10:41 AM  11/24/2020   11:14 AM 07/21/2020    3:04 PM  PHQ 2/9 Scores  PHQ - 2 Score 0 0 0 0 0 0 0  PHQ- 9 Score 0  0 0       Fall Risk    04/27/2023   11:16 AM 10/13/2022    1:52 PM 07/27/2022   10:49 AM 06/29/2022    9:29 AM 07/23/2021   10:44 AM  Fall Risk   Falls in the past year? 0 0 0 0 0  Number falls in past yr: 0 0 0 0 0  Injury with Fall? 0 0 0 0 0  Risk for fall due to : Impaired mobility Impaired mobility;Impaired balance/gait Impaired balance/gait Impaired balance/gait;Other (Comment) Impaired mobility  Risk for fall due to: Comment   Wheelchair bound wheelchair   Follow up Falls evaluation completed;Falls prevention discussed Falls evaluation completed;Falls prevention discussed Falls evaluation completed;Falls prevention discussed Falls evaluation completed Falls evaluation completed;Falls prevention discussed;Education provided    MEDICARE RISK AT HOME: Medicare Risk at Home Any stairs in or around the home?: No If so, are there any without handrails?: No Home free of loose throw rugs in walkways, pet beds, electrical cords, etc?: No Adequate lighting in your home to reduce risk of falls?: Yes Life alert?: No Use of a cane, walker or w/c?: Yes Grab bars in the bathroom?: Yes Shower chair or  bench in shower?: Yes Elevated toilet seat or a handicapped toilet?: Yes  TIMED UP AND GO:  Was the test performed?  No    Cognitive Function:    07/27/2022   10:54 AM  MMSE - Mini Mental State Exam  Orientation to time 5  Orientation to Place 5  Registration 3  Attention/ Calculation 5  Recall 3  Language- name 2 objects 2  Language- repeat 1  Language- follow 3 step command 3  Language- read & follow direction 1  Write a sentence 1  Copy design 0  Total score 29        07/27/2022   11:27 AM 07/23/2021   10:46 AM  6CIT Screen  What Year? 0 points 0 points  What month? 0 points 0 points  What time? 0 points 0 points  Count back from 20 0 points 0 points  Months in reverse 0 points 2 points  Repeat phrase 0 points 0 points  Total Score 0 points 2 points    Immunizations Immunization History  Administered Date(s) Administered   Fluad Quad(high Dose 65+) 06/18/2020, 06/02/2021, 06/29/2022, 10/13/2022   Fluad Trivalent(High Dose 65+) 07/31/2023   Influenza, High Dose Seasonal PF 07/02/2017   Influenza-Unspecified 07/27/2018   PFIZER(Purple Top)SARS-COV-2 Vaccination 12/09/2019, 01/06/2020, 08/27/2020   Pfizer Covid-19 Vaccine Bivalent Booster 55yrs & up 09/08/2021   Pneumococcal Conjugate-13 09/14/2015   Pneumococcal Polysaccharide-23 07/31/2012, 08/06/2018    Screening Tests Health Maintenance  Topic Date Due   DTaP/Tdap/Td (1 - Tdap) Never done   Zoster Vaccines- Shingrix (1 of 2) Never done   Colonoscopy  12/08/2022   COVID-19 Vaccine (5 - 2023-24 season) 08/16/2023 (Originally 05/28/2023)   MAMMOGRAM  02/14/2025   Pneumonia Vaccine 52+ Years old  Completed   INFLUENZA VACCINE  Completed   DEXA SCAN  Completed   Hepatitis C Screening  Completed   HPV VACCINES  Aged Out    Health Maintenance  Health Maintenance Due  Topic Date Due   DTaP/Tdap/Td (1 - Tdap) Never done   Zoster Vaccines- Shingrix (1 of 2) Never done  Colonoscopy  12/08/2022     Additional Screening:  Vision Screening: Recommended annual ophthalmology exams for early detection of glaucoma and other disorders of the eye. Is the patient up to date with their annual eye exam?  No  Who is the provider or what is the name of the office in which the patient attends annual eye exams? Dr. Su Hilt If pt is not established with a provider, would they like to be referred to a provider to establish care? No .   Dental Screening: Recommended annual dental exams for proper oral hygiene  Community Resource Referral / Chronic Care Management: CRR required this visit?  No   CCM required this visit?  No   Assessment & Plan:    Encounter for Medicare annual wellness exam Assessment & Plan: Things to do to keep yourself healthy  - Exercise at least 30-45 minutes a day, 3-4 days a week.  - Eat a low-fat diet with lots of fruits and vegetables, up to 7-9 servings per day.  - Seatbelts can save your life. Wear them always.  - Smoke detectors on every level of your home, check batteries every year.  - Eye Doctor - have an eye exam every 1-2 years  - Safe sex - if you may be exposed to STDs, use a condom.  - Alcohol -  If you drink, do it moderately, less than 2 drinks per day.  - Health Care Power of Attorney. Choose someone to speak for you if you are not able.  - Depression is common in our stressful world.If you're feeling down or losing interest in things you normally enjoy, please come in for a visit.  - Violence - If anyone is threatening or hurting you, please call immediately.    Acquired thrombophilia (HCC) Assessment & Plan: Patient is taking xarelto  Orders: -     CBC with Differential/Platelet  Hx of AKA (above knee amputation), left (HCC) Assessment & Plan: Stable   Benign essential HTN Assessment & Plan: Well controlled.  No changes to medicines.  Continue Losartan 5 mg QD, Nifedipine 30 mg every day, atenolol 50 mg daily. . Continue to work on  eating a healthy diet and chair exercises.  Labs drawn today.   Orders: -     CBC with Differential/Platelet -     Comprehensive metabolic panel  Mixed hyperlipidemia Assessment & Plan: Well controlled.  No changes to medicines. Simvastatin 40 mg, Lovaza 1 gm 2 capsules BID Continue to work on eating a healthy diet and work on chair exercises.  Labs drawn today.   Orders: -     Lipid panel  Encounter for immunization -     Flu Vaccine Trivalent High Dose (Fluad)     No orders of the defined types were placed in this encounter.   Orders Placed This Encounter  Procedures   Flu Vaccine Trivalent High Dose (Fluad)   CBC with Differential/Platelet   Comprehensive metabolic panel   Lipid panel     Follow-up: Return in 1 year (on 07/30/2024) for AWV and in 6 months for chronic visit.   I have personally reviewed and noted the following in the patient's chart:   Medical and social history Use of alcohol, tobacco or illicit drugs  Current medications and supplements including opioid prescriptions. Patient is not currently taking opioid prescriptions. Functional ability and status Nutritional status Physical activity Advanced directives List of other physicians Hospitalizations, surgeries, and ER visits in previous 12 months Vitals Screenings to include  cognitive, depression, and falls Referrals and appointments  In addition, I have reviewed and discussed with patient certain preventive protocols, quality metrics, and best practice recommendations. A written personalized care plan for preventive services as well as general preventive health recommendations were provided to patient.   Clayborn Bigness I Leal-Borjas,acting as a scribe for Blane Ohara, MD.,have documented all relevant documentation on the behalf of Blane Ohara, MD,as directed by  Blane Ohara, MD while in the presence of Blane Ohara, MD.   I attest that I have reviewed this visit and agree with the plan scribed by my  staff.   Blane Ohara, MD [facility] [phone]

## 2023-07-31 ENCOUNTER — Encounter: Payer: Medicare HMO | Admitting: Nurse Practitioner

## 2023-07-31 ENCOUNTER — Encounter: Payer: Self-pay | Admitting: Oncology

## 2023-07-31 ENCOUNTER — Ambulatory Visit: Payer: Medicare PPO | Admitting: Family Medicine

## 2023-07-31 ENCOUNTER — Encounter: Payer: Self-pay | Admitting: Family Medicine

## 2023-07-31 VITALS — BP 130/72 | HR 69 | Temp 97.6°F | Ht 62.5 in | Wt 190.0 lb

## 2023-07-31 DIAGNOSIS — I69354 Hemiplegia and hemiparesis following cerebral infarction affecting left non-dominant side: Secondary | ICD-10-CM | POA: Diagnosis not present

## 2023-07-31 DIAGNOSIS — Z89612 Acquired absence of left leg above knee: Secondary | ICD-10-CM | POA: Diagnosis not present

## 2023-07-31 DIAGNOSIS — E782 Mixed hyperlipidemia: Secondary | ICD-10-CM

## 2023-07-31 DIAGNOSIS — Z23 Encounter for immunization: Secondary | ICD-10-CM | POA: Diagnosis not present

## 2023-07-31 DIAGNOSIS — D6869 Other thrombophilia: Secondary | ICD-10-CM | POA: Diagnosis not present

## 2023-07-31 DIAGNOSIS — I10 Essential (primary) hypertension: Secondary | ICD-10-CM | POA: Diagnosis not present

## 2023-07-31 DIAGNOSIS — Z0000 Encounter for general adult medical examination without abnormal findings: Secondary | ICD-10-CM

## 2023-07-31 LAB — CBC WITH DIFFERENTIAL/PLATELET
Basophils Absolute: 0.1 10*3/uL (ref 0.0–0.2)
Basos: 1 %
EOS (ABSOLUTE): 0.4 10*3/uL (ref 0.0–0.4)
Eos: 6 %
Hematocrit: 35.5 % (ref 34.0–46.6)
Hemoglobin: 10.9 g/dL — ABNORMAL LOW (ref 11.1–15.9)
Immature Grans (Abs): 0 10*3/uL (ref 0.0–0.1)
Immature Granulocytes: 0 %
Lymphocytes Absolute: 1.8 10*3/uL (ref 0.7–3.1)
Lymphs: 30 %
MCH: 24.7 pg — ABNORMAL LOW (ref 26.6–33.0)
MCHC: 30.7 g/dL — ABNORMAL LOW (ref 31.5–35.7)
MCV: 80 fL (ref 79–97)
Monocytes Absolute: 0.7 10*3/uL (ref 0.1–0.9)
Monocytes: 12 %
Neutrophils Absolute: 3.1 10*3/uL (ref 1.4–7.0)
Neutrophils: 51 %
Platelets: 473 10*3/uL — ABNORMAL HIGH (ref 150–450)
RBC: 4.42 x10E6/uL (ref 3.77–5.28)
RDW: 14.5 % (ref 11.7–15.4)
WBC: 6.1 10*3/uL (ref 3.4–10.8)

## 2023-07-31 LAB — COMPREHENSIVE METABOLIC PANEL
ALT: 21 [IU]/L (ref 0–32)
AST: 20 [IU]/L (ref 0–40)
Albumin: 4.4 g/dL (ref 3.8–4.8)
Alkaline Phosphatase: 87 [IU]/L (ref 44–121)
BUN/Creatinine Ratio: 16 (ref 12–28)
BUN: 14 mg/dL (ref 8–27)
Bilirubin Total: <0.2 mg/dL (ref 0.0–1.2)
CO2: 22 mmol/L (ref 20–29)
Calcium: 10 mg/dL (ref 8.7–10.3)
Chloride: 100 mmol/L (ref 96–106)
Creatinine, Ser: 0.85 mg/dL (ref 0.57–1.00)
Globulin, Total: 2.9 g/dL (ref 1.5–4.5)
Glucose: 90 mg/dL (ref 70–99)
Potassium: 5.4 mmol/L — ABNORMAL HIGH (ref 3.5–5.2)
Sodium: 136 mmol/L (ref 134–144)
Total Protein: 7.3 g/dL (ref 6.0–8.5)
eGFR: 72 mL/min/{1.73_m2} (ref >59)

## 2023-07-31 LAB — LIPID PANEL
Chol/HDL Ratio: 2.8 ratio (ref 0.0–4.4)
Cholesterol, Total: 143 mg/dL (ref 100–199)
HDL: 52 mg/dL (ref >39)
LDL Chol Calc (NIH): 69 mg/dL (ref 0–99)
Triglycerides: 124 mg/dL (ref 0–149)
VLDL Cholesterol Cal: 22 mg/dL (ref 5–40)

## 2023-08-05 DIAGNOSIS — Z0000 Encounter for general adult medical examination without abnormal findings: Secondary | ICD-10-CM | POA: Insufficient documentation

## 2023-08-05 NOTE — Assessment & Plan Note (Signed)
Things to do to keep yourself healthy  °- Exercise at least 30-45 minutes a day, 3-4 days a week.  °- Eat a low-fat diet with lots of fruits and vegetables, up to 7-9 servings per day.  °- Seatbelts can save your life. Wear them always.  °- Smoke detectors on every level of your home, check batteries every year.  °- Eye Doctor - have an eye exam every 1-2 years  °- Safe sex - if you may be exposed to STDs, use a condom.  °- Alcohol -  If you drink, do it moderately, less than 2 drinks per day.  °- Health Care Power of Attorney. Choose someone to speak for you if you are not able.  °- Depression is common in our stressful world.If you're feeling down or losing interest in things you normally enjoy, please come in for a visit.  °- Violence - If anyone is threatening or hurting you, please call immediately. ° ° °

## 2023-08-05 NOTE — Assessment & Plan Note (Signed)
Well controlled.  No changes to medicines.  Continue Losartan 5 mg QD, Nifedipine 30 mg every day, atenolol 50 mg daily. . Continue to work on eating a healthy diet and chair exercises.  Labs drawn today.

## 2023-08-05 NOTE — Assessment & Plan Note (Signed)
Patient is taking xarelto

## 2023-08-05 NOTE — Assessment & Plan Note (Signed)
Well controlled.  No changes to medicines. Simvastatin 40 mg, Lovaza 1 gm 2 capsules BID Continue to work on eating a healthy diet and work on chair exercises.  Labs drawn today.

## 2023-08-05 NOTE — Assessment & Plan Note (Signed)
Stable

## 2023-08-06 ENCOUNTER — Encounter: Payer: Self-pay | Admitting: Family Medicine

## 2023-08-09 ENCOUNTER — Other Ambulatory Visit: Payer: Self-pay

## 2023-08-09 ENCOUNTER — Telehealth: Payer: Self-pay

## 2023-08-09 DIAGNOSIS — Z1211 Encounter for screening for malignant neoplasm of colon: Secondary | ICD-10-CM

## 2023-08-09 NOTE — Telephone Encounter (Signed)
Referral to GI placed for Colonoscopy with Dr. Jennye Boroughs. Patient will be contacted with appt date and time.

## 2023-08-09 NOTE — Telephone Encounter (Signed)
Copied from CRM [id number]. Topic: Referral - Status >> Aug 09, 2023 10:41 AM Dennison Nancy wrote: Reason for CRM:  checking on status of referral for a colonoscopy with Dr. Tama Headings  , patient request if her provider can call Dr. Tama Headings and schedule an appointment for her .        Please call patient to let know          callback # [phone]

## 2023-08-21 ENCOUNTER — Other Ambulatory Visit: Payer: Self-pay | Admitting: Family Medicine

## 2023-08-21 ENCOUNTER — Other Ambulatory Visit: Payer: Self-pay

## 2023-08-21 DIAGNOSIS — R110 Nausea: Secondary | ICD-10-CM

## 2023-08-21 MED ORDER — ONDANSETRON HCL 4 MG PO TABS: 4.0000 mg | ORAL_TABLET | Freq: Three times a day (TID) | ORAL | 1 refills | Status: DC | PRN

## 2023-08-31 ENCOUNTER — Encounter: Payer: Medicare HMO | Admitting: Nurse Practitioner

## 2023-09-16 ENCOUNTER — Other Ambulatory Visit: Payer: Self-pay | Admitting: Family Medicine

## 2023-09-16 DIAGNOSIS — I10 Essential (primary) hypertension: Secondary | ICD-10-CM

## 2023-09-21 ENCOUNTER — Other Ambulatory Visit: Payer: Self-pay

## 2023-09-25 ENCOUNTER — Other Ambulatory Visit: Payer: Self-pay | Admitting: Family Medicine

## 2023-09-25 DIAGNOSIS — G43009 Migraine without aura, not intractable, without status migrainosus: Secondary | ICD-10-CM

## 2023-09-25 NOTE — Telephone Encounter (Signed)
Copied from CRM [id number]. Topic: Clinical - Medication Refill >> Sep 25, 2023 11:47 AM Ivette P wrote: Most Recent Primary Care Visit:  Provider: [facility]  Department[facility]  Visit Type: MEDICARE AWV, SEQUENTIAL  Date: 07/31/2023  Medication: butalbital-acetaminophen-caffeine (FIORICET) 50-325-40 MG tablet  Has the patient contacted their pharmacy? No (Agent: If no, request that the patient contact the pharmacy for the refill. If patient does not wish to contact the pharmacy document the reason why and proceed with request.) (Agent: If yes, when and what did the pharmacy advise?)  Is this the correct pharmacy for this prescription? Yes If no, delete pharmacy and type the correct one.  This is the patient's preferred pharmacy:  [id number] - [place], [place] - [address] AT [facility] [address] [place] [address] Phone: [phone] Fax: [phone]   Has the prescription been filled recently? No  Is the patient out of the medication? Yes  Has the patient been seen for an appointment in the last year OR does the patient have an upcoming appointment? Yes  Can we respond through MyChart? No  Agent: Please be advised that Rx refills may take up to 3 business days. We ask that you follow-up with your pharmacy.

## 2023-09-26 MED ORDER — BUTALBITAL-APAP-CAFFEINE 50-325-40 MG PO TABS: 1.0000 | ORAL_TABLET | Freq: Four times a day (QID) | ORAL | 2 refills | Status: DC | PRN

## 2023-09-27 DIAGNOSIS — Z89612 Acquired absence of left leg above knee: Secondary | ICD-10-CM | POA: Insufficient documentation

## 2023-09-27 DIAGNOSIS — K5900 Constipation, unspecified: Secondary | ICD-10-CM

## 2023-09-27 DIAGNOSIS — Z86718 Personal history of other venous thrombosis and embolism: Secondary | ICD-10-CM | POA: Insufficient documentation

## 2023-10-05 ENCOUNTER — Telehealth: Payer: Self-pay

## 2023-10-05 NOTE — Telephone Encounter (Signed)
 Called patient, she stated that she think she is a little raw due to having diarrhea so much.  Recommend patient try over the counter A&D ointment, and if that doesn't help than call office back and make an appointment to be seen so she can be checked out.

## 2023-10-05 NOTE — Telephone Encounter (Signed)
 Copied from CRM [id number]. Topic: Clinical - Medication Question >> Oct 05, 2023  8:35 AM Zacyrah J wrote: Reason for CRM: pt had really bad diarrhea and their bottom is hurting from the diarrhea. Pt would like for Dr. Sherre to prescribe her something to her drug store on [address]

## 2023-10-19 ENCOUNTER — Other Ambulatory Visit: Payer: Self-pay

## 2023-10-31 ENCOUNTER — Other Ambulatory Visit: Payer: Self-pay | Admitting: Family Medicine

## 2023-10-31 MED ORDER — LOSARTAN POTASSIUM 50 MG PO TABS: 50.0000 mg | ORAL_TABLET | Freq: Every day | ORAL | 1 refills | Status: DC

## 2023-10-31 NOTE — Telephone Encounter (Signed)
 Copied from CRM [id number]. Topic: Clinical - Medication Refill >> Oct 31, 2023  9:56 AM [name] wrote: Most Recent Primary Care Visit:  Provider: [facility], KIRSTEN  Department: [facility]  Visit Type: MEDICARE AWV, SEQUENTIAL  Date: 07/31/2023  Medication: losartan  (COZAAR ) 50 MG tablet   Has the patient contacted their pharmacy? No (Agent: If no, request that the patient contact the pharmacy for the refill. If patient does not wish to contact the pharmacy document the reason why and proceed with request.) (Agent: If yes, when and what did the pharmacy advise?)  Is this the correct pharmacy for this prescription? Yes If no, delete pharmacy and type the correct one.  This is the patient's preferred pharmacy:  [id number] [place],  - [address] AT [facility] [address] [place] [address] Phone: [phone] Fax: [phone]   Has the prescription been filled recently? No  Is the patient out of the medication? No She has 2-3 left  Has the patient been seen for an appointment in the last year OR does the patient have an upcoming appointment? Yes  Can we respond through MyChart? No  Agent: Please be advised that Rx refills may take up to 3 business days. We ask that you follow-up with your pharmacy.

## 2023-11-20 ENCOUNTER — Other Ambulatory Visit: Payer: Self-pay | Admitting: Family Medicine

## 2023-11-20 DIAGNOSIS — G43009 Migraine without aura, not intractable, without status migrainosus: Secondary | ICD-10-CM

## 2023-12-21 ENCOUNTER — Encounter: Payer: Self-pay | Admitting: Oncology

## 2023-12-21 ENCOUNTER — Telehealth: Payer: Self-pay

## 2023-12-21 NOTE — Telephone Encounter (Signed)
 Appt scheduled.  Copied from CRM [id number]. Topic: Clinical - Medical Advice >> Dec 21, 2023  8:35 AM Carlatta H wrote: Reason for CRM: Patient is requesting home care assistance//Please call patient to advise

## 2023-12-25 ENCOUNTER — Encounter: Payer: Self-pay | Admitting: Oncology

## 2024-01-01 ENCOUNTER — Encounter: Payer: Self-pay | Admitting: Family Medicine

## 2024-01-01 ENCOUNTER — Ambulatory Visit (INDEPENDENT_AMBULATORY_CARE_PROVIDER_SITE_OTHER): Admitting: Family Medicine

## 2024-01-01 ENCOUNTER — Encounter: Payer: Self-pay | Admitting: Oncology

## 2024-01-01 VITALS — BP 138/68 | HR 72 | Temp 97.7°F | Resp 16 | Ht 62.5 in | Wt 184.0 lb

## 2024-01-01 DIAGNOSIS — Z89612 Acquired absence of left leg above knee: Secondary | ICD-10-CM

## 2024-01-01 DIAGNOSIS — I69354 Hemiplegia and hemiparesis following cerebral infarction affecting left non-dominant side: Secondary | ICD-10-CM | POA: Insufficient documentation

## 2024-01-01 DIAGNOSIS — G43919 Migraine, unspecified, intractable, without status migrainosus: Secondary | ICD-10-CM | POA: Diagnosis not present

## 2024-01-01 DIAGNOSIS — L608 Other nail disorders: Secondary | ICD-10-CM | POA: Diagnosis not present

## 2024-01-01 DIAGNOSIS — I6930 Unspecified sequelae of cerebral infarction: Secondary | ICD-10-CM

## 2024-01-01 MED ORDER — KETOROLAC TROMETHAMINE 60 MG/2ML IM SOLN
60.0000 mg | Freq: Once | INTRAMUSCULAR | Status: AC
  Administered 2024-01-01: 60 mg via INTRAMUSCULAR

## 2024-01-01 NOTE — Assessment & Plan Note (Signed)
 Chronic migraine, current episode severe, unresponsive to Tylenol and Fioricet. Nurtec previously caused stomach upset. - Administer Toradol injection for acute relief.

## 2024-01-01 NOTE — Assessment & Plan Note (Signed)
 Painful over grown toenail on right foot. - Refer to podiatrist for evaluation and management.

## 2024-01-01 NOTE — Assessment & Plan Note (Signed)
 Home health services requested for help with changing sheets, grocery shopping, and dr appointments.

## 2024-01-01 NOTE — Assessment & Plan Note (Signed)
 Request for Home health services

## 2024-01-01 NOTE — Progress Notes (Signed)
 Subjective:  Patient ID: Anna Hayes, female    DOB: Aug 16, 1951  Age: 73 y.o. MRN: [id number]  Chief Complaint  Patient presents with   Migraine     Discussed the use of AI scribe software for clinical note transcription with the patient, who gave verbal consent to proceed.  HPI Anna Hayes is a 73 year old female who presents with a migraine lasting over three days. She is accompanied by her daughter, Anna Hayes, who is her primary caregiver.  She has been experiencing a migraine for over three days, which has not responded to her usual treatment of Tylenol. The pain is located in her forehead and another unspecified area, with no radiation. She has a history of migraines for seventeen years and has previously received Toradol injections for relief, with the last one on April 27, 2023. She is currently taking Fioricet for her headaches, which she finds effective, but it has not alleviated her current migraine. She has tried Nurtec in the past, but it caused stomach upset.  She has a history of allergies and is currently taking an unspecified allergy medication along with Flonase. She is allergic to aspirin but states that Toradol does not cause her any issues.  Her blood pressure was noted to be slightly elevated at 138/68, although she mentions it is usually around 120.   She reports issues with her toenails growing into the skin on her right foot, causing significant pain, especially when wearing socks. She has a family history of similar issues and is seeking a referral to a podiatrist.  She is seeking assistance with home health care due to her daughter working during the day. She requires help with tasks such as changing sheets, running errands, and transportation to appointments.     04/27/2023   11:16 AM 10/13/2022    1:52 PM 07/27/2022   10:49 AM 06/29/2022    9:30 AM 07/23/2021   10:41 AM  Depression screen PHQ 2/9  Decreased Interest 0 0 0 0 0  Down, Depressed,  Hopeless 0 0 0 0 0  PHQ - 2 Score 0 0 0 0 0  Altered sleeping 0  0 0   Tired, decreased energy 0  0 0   Change in appetite 0  0 0   Feeling bad or failure about yourself  0  0 0   Trouble concentrating 0  0 0   Moving slowly or fidgety/restless 0  0 0   Suicidal thoughts 0  0 0   PHQ-9 Score 0  0 0   Difficult doing work/chores Not difficult at all  Not difficult at all Not difficult at all         04/27/2023   11:16 AM  Fall Risk   Falls in the past year? 0  Number falls in past yr: 0  Injury with Fall? 0  Risk for fall due to : Impaired mobility  Follow up Falls evaluation completed;Falls prevention discussed    Patient Care Team: Cox, Fritzi Mandes, MD as PCP - General (Family Medicine)   Review of Systems  Constitutional:  Negative for chills, diaphoresis, fatigue and fever.  HENT:  Positive for sinus pain. Negative for congestion and ear pain.   Eyes: Negative.   Respiratory:  Negative for cough and shortness of breath.   Cardiovascular:  Negative for chest pain.  Gastrointestinal:  Negative for abdominal pain, constipation, nausea and vomiting.  Endocrine: Negative.   Genitourinary:  Negative for dysuria.  Musculoskeletal:  Negative  for arthralgias.  Skin: Negative.   Allergic/Immunologic: Negative.   Neurological:  Positive for headaches. Negative for weakness.  Psychiatric/Behavioral:  Negative for dysphoric mood. The patient is not nervous/anxious.     Current Outpatient Medications on File Prior to Visit  Medication Sig Dispense Refill   atenolol (TENORMIN) 50 MG tablet TAKE 1 TABLET BY MOUTH EVERY DAY 90 tablet 1   butalbital-acetaminophen-caffeine (FIORICET) 50-325-40 MG tablet TAKE 1 TABLET BY MOUTH EVERY 6 (SIX) HOURS AS NEEDED FOR MIGRAINE. 30 tablet 2   cyanocobalamin (,VITAMIN B-12,) 1000 MCG/ML injection Inject into the muscle.     fluticasone (FLONASE) 50 MCG/ACT nasal spray SPRAY 2 SPRAYS INTO EACH NOSTRIL EVERY DAY 48 mL 2   furosemide (LASIX) 20 MG tablet  Take 20 mg by mouth daily as needed for fluid.      levothyroxine (SYNTHROID) 50 MCG tablet Take 1 tablet (50 mcg total) by mouth daily. 90 tablet 1   linaclotide (LINZESS) 145 MCG CAPS capsule TAKE 1 CAPSULE BY MOUTH EVERY DAY BEFORE BREAKFAST 90 capsule 1   losartan (COZAAR) 50 MG tablet Take 1 tablet (50 mg total) by mouth daily. 90 tablet 1   NIFEdipine (PROCARDIA XL) 30 MG 24 hr tablet Take 1 tablet (30 mg total) by mouth daily. 90 tablet 1   omega-3 acid ethyl esters (LOVAZA) 1 g capsule TAKE 2 CAPSULES BY MOUTH 2 TIMES DAILY. 360 capsule 1   omeprazole (PRILOSEC) 40 MG capsule Take 1 capsule (40 mg total) by mouth daily. 90 capsule 1   ondansetron (ZOFRAN) 4 MG tablet Take 1 tablet (4 mg total) by mouth every 8 (eight) hours as needed for nausea or vomiting. 90 tablet 1   rivaroxaban (XARELTO) 20 MG TABS tablet Take 1 tablet (20 mg total) by mouth daily with supper. 90 tablet 1   simvastatin (ZOCOR) 40 MG tablet Take 1 tablet (40 mg total) by mouth daily. 90 tablet 1   No current facility-administered medications on file prior to visit.   Past Medical History:  Diagnosis Date   Anemia    Constipation    DVT of lower extremity (deep venous thrombosis) (HCC)    Frequent headaches    Heart murmur    found during pregnancy -  no problems   Hypertension    Muscle pain    Palpitations    Septic thrombophlebitis 08/31/2017   Sinus problem    [facility]    Past Surgical History:  Procedure Laterality Date   ABDOMINAL EXPLORATION SURGERY  05/2017   LOA and release of SBO, repair of small bowel perforation.   AMPUTATION Left 07/05/2017   Procedure: LEFT ABOVE KNEE AMPUTATION;  Surgeon: Fransisco Hertz, MD;  Location: [facility] OR;  Service: Vascular;  Laterality: Left;   CATARACT EXTRACTION W/PHACO Right 06/12/2018   Procedure: CATARACT EXTRACTION WITH INTRAOCULAR LENS IMPLANT;  Surgeon: Carmela Rima, MD;  Location: [facility] OR;  Service: Ophthalmology;  Laterality: Right;   COLONOSCOPY   11/26/2015   Dr Jennye Boroughs - follow-up in 5 years   EMBOLECTOMY Bilateral 06/27/2017   Procedure: BILATERAL FEMORAL POPLITEAL EMBOLECTOMY;  Surgeon: Nada Libman, MD;  Location: [facility] OR;  Service: Vascular;  Laterality: Bilateral;   FASCIOTOMY Left 06/27/2017   Procedure: FULL COMPARTMENT LEFT LOWER LEG FASCIOTOMY;  Surgeon: Nada Libman, MD;  Location: [facility] OR;  Service: Vascular;  Laterality: Left;   PARTIAL HYSTERECTOMY     PATCH ANGIOPLASTY Left 06/27/2017   Procedure: PATCH ANGIOPLASTY Left Posterior Tibial Artery;  Surgeon: Myra Gianotti,  Fran Lowes, MD;  Location: [facility] OR;  Service: Vascular;  Laterality: Left;   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 07/11/2017   Procedure: REPAIR OF COMPLEX TRACTION RETINAL DETACHMENT WITH ENDO LASER AND ANTIFUNGAL INJECTION;  Surgeon: Carmela Rima, MD;  Location: [facility] OR;  Service: Ophthalmology;  Laterality: Right;   TEE WITHOUT CARDIOVERSION N/A 07/11/2017   Procedure: TRANSESOPHAGEAL ECHOCARDIOGRAM (TEE);  Surgeon: Elease Hashimoto Deloris Ping, MD;  Location: [facility] ENDOSCOPY;  Service: Cardiovascular;  Laterality: N/A;    Family History  Problem Relation Age of Onset   Breast cancer Mother    Prostate cancer Father    Prostate cancer Brother    Social History   Socioeconomic History   Marital status: Widowed    Spouse name: Not on file   Number of children: Not on file   Years of education: Not on file   Highest education level: Not on file  Occupational History   Not on file  Tobacco Use   Smoking status: Some Days    Current packs/day: 0.25    Average packs/day: 0.3 packs/day for 53.0 years (13.3 ttl pk-yrs)    Types: Cigarettes   Smokeless tobacco: Never  Vaping Use   Vaping status: Never Used  Substance and Sexual Activity   Alcohol use: No   Drug use: No   Sexual activity: Not Currently  Other Topics Concern   Not on file  Social History Narrative   Lives with her daughter   Social Drivers of Health   Financial Resource  Strain: Low Risk  (07/31/2023)   Overall Financial Resource Strain (CARDIA)    Difficulty of Paying Living Expenses: Not hard at all  Food Insecurity: No Food Insecurity (07/31/2023)   Hunger Vital Sign    Worried About Running Out of Food in the Last Year: Never true    Ran Out of Food in the Last Year: Never true  Transportation Needs: No Transportation Needs (01/11/2023)   PRAPARE - Administrator, Civil Service (Medical): No    Lack of Transportation (Non-Medical): No  Physical Activity: Insufficiently Active (07/31/2023)   Exercise Vital Sign    Days of Exercise per Week: 1 day    Minutes of Exercise per Session: 60 min  Stress: No Stress Concern Present (07/31/2023)   [employer] of Occupational Health - Occupational Stress Questionnaire    Feeling of Stress : Not at all  Social Connections: Moderately Isolated (07/31/2023)   Social Connection and Isolation Panel [NHANES]    Frequency of Communication with Friends and Family: More than three times a week    Frequency of Social Gatherings with Friends and Family: More than three times a week    Attends Religious Services: 1 to 4 times per year    Active Member of [employer] or Organizations: No    Attends Banker Meetings: Never    Marital Status: Widowed    Objective:  BP 138/68   Pulse 72   Temp 97.7 F (36.5 C) (Temporal)   Resp 16   Ht 5' 2.5" (1.588 m)   Wt 184 lb (83.5 kg)   SpO2 98%   BMI 33.12 kg/m      01/01/2024   10:04 AM 07/31/2023   10:33 AM 04/27/2023   11:11 AM  BP/Weight  Systolic BP 138 130 110  Diastolic BP 68 72 60  Wt. (Lbs) 184 190   BMI 33.12 kg/m2 34.2 kg/m2     Physical Exam Constitutional:  General: She is not in acute distress.    Appearance: Normal appearance.  HENT:     Right Ear: Tympanic membrane normal.     Left Ear: Tympanic membrane normal.     Nose:     Right Sinus: Frontal sinus tenderness present. No maxillary sinus tenderness.     Left Sinus:  Frontal sinus tenderness present. No maxillary sinus tenderness.     Mouth/Throat:     Pharynx: No posterior oropharyngeal erythema.  Cardiovascular:     Rate and Rhythm: Normal rate and regular rhythm.     Heart sounds: Normal heart sounds. No murmur heard. Pulmonary:     Effort: Pulmonary effort is normal.     Breath sounds: Normal breath sounds. No wheezing.  Abdominal:     General: Bowel sounds are normal.     Palpations: Abdomen is soft.     Tenderness: There is no abdominal tenderness.  Musculoskeletal:     Left Lower Extremity: Left leg is amputated above knee.  Feet:     Right foot:     Toenail Condition: Right toenails are long.  Neurological:     Mental Status: She is alert. Mental status is at baseline.  Psychiatric:        Mood and Affect: Mood normal.        Behavior: Behavior normal.    Lab Results  Component Value Date   WBC 6.1 07/31/2023   HGB 10.9 (L) 07/31/2023   HCT 35.5 07/31/2023   PLT 473 (H) 07/31/2023   GLUCOSE 90 07/31/2023   CHOL 143 07/31/2023   TRIG 124 07/31/2023   HDL 52 07/31/2023   LDLCALC 69 07/31/2023   ALT 21 07/31/2023   AST 20 07/31/2023   NA 136 07/31/2023   K 5.4 (H) 07/31/2023   CL 100 07/31/2023   CREATININE 0.85 07/31/2023   BUN 14 07/31/2023   CO2 22 07/31/2023   TSH 2.200 10/13/2022   INR 1.3 (H) 12/08/2022      Assessment & Plan:  Assessment and Plan    Intractable migraine without status migrainosus, unspecified migraine type Assessment & Plan: Chronic migraine, current episode severe, unresponsive to Tylenol and Fioricet. Nurtec previously caused stomach upset. - Administer Toradol injection for acute relief.    Orders: -     Ketorolac Tromethamine  History of CVA with residual deficit Assessment & Plan: Home health services requested for help with changing sheets, grocery shopping, and dr appointments.   Orders: -     Ambulatory referral to Home Health  Toenail deformity Assessment &  Plan: Painful over grown toenail on right foot. - Refer to podiatrist for evaluation and management.  Orders: -     Ambulatory referral to Podiatry  Hx of AKA (above knee amputation), left (HCC) -     Ambulatory referral to Home Health     Meds ordered this encounter  Medications   ketorolac (TORADOL) injection 60 mg    Orders Placed This Encounter  Procedures   Ambulatory referral to Home Health   Ambulatory referral to Podiatry     Follow-up: Return in about 4 weeks (around 01/29/2024).   An After Visit Summary was printed and given to the patient.  Total time spent on today's visit was 38 minutes, including both face-to-face time and nonface-to-face time personally spent on review of chart (labs and imaging), discussing labs and goals, discussing further work-up, treatment options, referrals to specialist if needed, reviewing outside records if pertinent, answering patient's questions, and coordinating  care.    Lajuana Matte, FNP [facility] [phone]

## 2024-01-01 NOTE — Addendum Note (Signed)
 Addended by: Precious Reel on: 01/01/2024 02:20 PM   Modules accepted: Orders

## 2024-01-04 DIAGNOSIS — L608 Other nail disorders: Secondary | ICD-10-CM | POA: Insufficient documentation

## 2024-01-04 DIAGNOSIS — I10 Essential (primary) hypertension: Secondary | ICD-10-CM

## 2024-01-04 DIAGNOSIS — Z720 Tobacco use: Secondary | ICD-10-CM

## 2024-01-11 NOTE — Telephone Encounter (Signed)
 Copied from CRM [id number]. Topic: General - Other >> Jan 11, 2024  3:29 PM Felizardo Hotter wrote: Reason for CRM: Received call from [facility] per Diamond Formica ph: [phone] pt refused home health, she follow up with pt next week.

## 2024-01-19 ENCOUNTER — Telehealth: Payer: Self-pay | Admitting: Family Medicine

## 2024-01-19 NOTE — Telephone Encounter (Signed)
 [facility] [phone]

## 2024-01-24 ENCOUNTER — Telehealth: Payer: Self-pay | Admitting: Family Medicine

## 2024-01-24 NOTE — Telephone Encounter (Signed)
 [facility] [phone]

## 2024-01-25 ENCOUNTER — Other Ambulatory Visit: Payer: Self-pay | Admitting: Family Medicine

## 2024-01-25 DIAGNOSIS — I10 Essential (primary) hypertension: Secondary | ICD-10-CM | POA: Diagnosis not present

## 2024-01-25 DIAGNOSIS — Z7901 Long term (current) use of anticoagulants: Secondary | ICD-10-CM | POA: Diagnosis not present

## 2024-01-25 DIAGNOSIS — I69354 Hemiplegia and hemiparesis following cerebral infarction affecting left non-dominant side: Secondary | ICD-10-CM | POA: Diagnosis not present

## 2024-01-25 DIAGNOSIS — G43009 Migraine without aura, not intractable, without status migrainosus: Secondary | ICD-10-CM

## 2024-01-25 DIAGNOSIS — K5900 Constipation, unspecified: Secondary | ICD-10-CM | POA: Diagnosis not present

## 2024-01-25 DIAGNOSIS — G43919 Migraine, unspecified, intractable, without status migrainosus: Secondary | ICD-10-CM | POA: Diagnosis not present

## 2024-01-25 DIAGNOSIS — L608 Other nail disorders: Secondary | ICD-10-CM | POA: Diagnosis not present

## 2024-01-25 DIAGNOSIS — D649 Anemia, unspecified: Secondary | ICD-10-CM | POA: Diagnosis not present

## 2024-01-25 DIAGNOSIS — Z720 Tobacco use: Secondary | ICD-10-CM | POA: Diagnosis not present

## 2024-01-25 DIAGNOSIS — Z89612 Acquired absence of left leg above knee: Secondary | ICD-10-CM | POA: Diagnosis not present

## 2024-01-25 DIAGNOSIS — Z86718 Personal history of other venous thrombosis and embolism: Secondary | ICD-10-CM | POA: Diagnosis not present

## 2024-01-25 NOTE — Telephone Encounter (Signed)
 Copied from CRM [id number]. Topic: Clinical - Medication Refill >> Jan 25, 2024  9:23 AM Emylou G wrote: Most Recent Primary Care Visit:  Provider: Janece Means  Department: [facility]  Visit Type: OFFICE VISIT  Date: 01/01/2024  Medication: butalbital -acetaminophen -caffeine  (FIORICET) 50-325-40 MG tablet  Has the patient contacted their pharmacy? No (Agent: If no, request that the patient contact the pharmacy for the refill. If patient does not wish to contact the pharmacy document the reason why and proceed with request.) (Agent: If yes, when and what did the pharmacy advise?)  Is this the correct pharmacy for this prescription? Yes If no, delete pharmacy and type the correct one.  This is the patient's preferred pharmacy:  [id number] - [place], [place] - [address] AT [facility] [address] [place] [address] Phone: [phone] Fax: [phone]   Has the prescription been filled recently? No  Is the patient out of the medication? Yes  Has the patient been seen for an appointment in the last year OR does the patient have an upcoming appointment? Yes  Can we respond through MyChart? No  Agent: Please be advised that Rx refills may take up to 3 business days. We ask that you follow-up with your pharmacy.

## 2024-01-26 ENCOUNTER — Ambulatory Visit: Payer: Self-pay | Admitting: *Deleted

## 2024-01-26 ENCOUNTER — Other Ambulatory Visit: Payer: Self-pay | Admitting: Family Medicine

## 2024-01-26 DIAGNOSIS — G43009 Migraine without aura, not intractable, without status migrainosus: Secondary | ICD-10-CM

## 2024-01-26 NOTE — Telephone Encounter (Addendum)
  Chief Complaint: Migraine- requesting refill of medication- medication has been pended to provider: prombutalbital-acetaminophen -caffeine  (FIORICET) 50-325-40 MG  Symptoms: stress- patient moving, migraine headache 2 days, pain all over head and behind her eyes Frequency: 2 days- out of medication  Pertinent Negatives: Patient denies other symptoms Disposition: [] ED /[] Urgent Care (no appt availability in office) / [] Appointment(In office/virtual)/ []  [place] [facility]/ [x] Home Care/ [] Refused Recommended Disposition /[] [place] [facility]/ []  Follow-up with PCP Additional Notes: Will send message to office for request of medication   Copied from CRM [id number]. Topic: Clinical - Red Word Triage >> Jan 26, 2024 11:33 AM Leory Rands wrote: Red Word that prombutalbital-acetaminophen -caffeine  (FIORICET) 50-325-40 MG tabletpted transfer to Nurse Triage: Patient is calling to report a headache for 2 days. Waiting on refill for Reason for Disposition  Similar to previously diagnosed migraine headaches  Caller requesting a CONTROLLED substance prescription refill (e.g., narcotics, ADHD medicines)  Answer Assessment - Initial Assessment Questions 1. DRUG NAME: "What medicine do you need to have refilled?"     prombutalbital-acetaminophen -caffeine  (FIORICET) 50-325-40 MG  2. REFILLS REMAINING: "How many refills are remaining?" (Note: The label on the medicine or pill bottle will show how many refills are remaining. If there are no refills remaining, then a renewal may be needed.)     none 3. EXPIRATION DATE: "What is the expiration date?" (Note: The label states when the prescription will expire, and thus can no longer be refilled.)     na 4. PRESCRIBING HCP: "Who prescribed it?" Reason: If prescribed by specialist, call should be referred to that group.     PCP 5. SYMPTOMS: "Do you have any symptoms?"     Migraine headache- 2 days- stress from moving  Answer Assessment - Initial Assessment  Questions 1. LOCATION: "Where does it hurt?"      All over 2. ONSET: "When did the headache start?" (Minutes, hours or days)      2 days 3. PATTERN: "Does the pain come and go, or has it been constant since it started?"     constant 4. SEVERITY: "How bad is the pain?" and "What does it keep you from doing?"  (e.g., Scale 1-10; mild, moderate, or severe)   - MILD (1-3): doesn't interfere with normal activities    - MODERATE (4-7): interferes with normal activities or awakens from sleep    - SEVERE (8-10): excruciating pain, unable to do any normal activities        severe 5. RECURRENT SYMPTOM: "Have you ever had headaches before?" If Yes, ask: "When was the last time?" and "What happened that time?"      chronic 6. CAUSE: "What do you think is causing the headache?"     stress 7. MIGRAINE: "Have you been diagnosed with migraine headaches?" If Yes, ask: "Is this headache similar?"      Yes  9. OTHER SYMPTOMS: "Do you have any other symptoms?" (fever, stiff neck, eye pain, sore throat, cold symptoms)     no  Protocols used: Medication Refill and Renewal Call-A-AH, Headache-A-AH

## 2024-01-29 ENCOUNTER — Other Ambulatory Visit: Payer: Self-pay | Admitting: Radiology

## 2024-01-29 ENCOUNTER — Other Ambulatory Visit: Payer: Self-pay | Admitting: Family Medicine

## 2024-01-29 ENCOUNTER — Encounter: Payer: Self-pay | Admitting: Oncology

## 2024-01-29 ENCOUNTER — Ambulatory Visit: Admitting: Family Medicine

## 2024-01-29 DIAGNOSIS — G43009 Migraine without aura, not intractable, without status migrainosus: Secondary | ICD-10-CM

## 2024-01-29 DIAGNOSIS — E038 Other specified hypothyroidism: Secondary | ICD-10-CM

## 2024-01-29 DIAGNOSIS — I10 Essential (primary) hypertension: Secondary | ICD-10-CM

## 2024-01-29 DIAGNOSIS — E782 Mixed hyperlipidemia: Secondary | ICD-10-CM

## 2024-01-29 NOTE — Telephone Encounter (Signed)
 Copied from CRM [id number]. Topic: Clinical - Medication Refill >> Jan 29, 2024 12:03 PM Georgeann Kindred wrote: Most Recent Primary Care Visit:  Provider: Janece Means  [facility]: [facility]  Visit Type: OFFICE VISIT  Date: 01/01/2024  Medication: butalbital -acetaminophen -caffeine  (FIORICET) 50-325-40 MG tablet  Has the patient contacted their pharmacy? Yes (Agent: If no, request that the patient contact the pharmacy for the refill. If patient does not wish to contact the pharmacy document the reason why and proceed with request.) (Agent: If yes, when and what did the pharmacy advise?) Advised to call provider office   Is this the correct pharmacy for this prescription? Yes If no, delete pharmacy and type the correct one.  This is the patient's preferred pharmacy:  [id number] [place] - [address] AT [facility] [address] [place] [address] Phone: [phone] Fax: [phone]   Has the prescription been filled recently? No  Is the patient out of the medication? Yes  Has the patient been seen for an appointment in the last year OR does the patient have an upcoming appointment? Yes  Can we respond through MyChart? No  Agent: Please be advised that Rx refills may take up to 3 business days. We ask that you follow-up with your pharmacy.

## 2024-01-29 NOTE — Telephone Encounter (Unsigned)
 Copied from CRM [id number]. Topic: Clinical - Medication Refill >> Jan 29, 2024 12:03 PM Georgeann Kindred wrote: Most Recent Primary Care Visit:  Provider: Janece Means  [facility]: [facility]  Visit Type: OFFICE VISIT  Date: 01/01/2024  Medication: butalbital -acetaminophen -caffeine  (FIORICET) 50-325-40 MG tablet  Has the patient contacted their pharmacy? Yes (Agent: If no, request that the patient contact the pharmacy for the refill. If patient does not wish to contact the pharmacy document the reason why and proceed with request.) (Agent: If yes, when and what did the pharmacy advise?) Advised to call provider office   Is this the correct pharmacy for this prescription? Yes If no, delete pharmacy and type the correct one.  This is the patient's preferred pharmacy:  [id number] - , [place] - [address] AT [facility] OF [address] [place] [address] Phone: [phone] Fax: [phone]   Has the prescription been filled recently? No  Is the patient out of the medication? Yes  Has the patient been seen for an appointment in the last year OR does the patient have an upcoming appointment? Yes  Can we respond through MyChart? No  Agent: Please be advised that Rx refills may take up to 3 business days. We ask that you follow-up with your pharmacy.

## 2024-01-29 NOTE — Telephone Encounter (Signed)
 Copied from CRM [id number]. Topic: Clinical - Medication Refill >> Jan 29, 2024 12:03 PM Georgeann Kindred wrote: Most Recent Primary Care Visit:  Provider: Janece Means  Department: [facility]  Visit Type: OFFICE VISIT  Date: 01/01/2024  Medication: butalbital -acetaminophen -caffeine  (FIORICET) 50-325-40 MG tablet  Has the patient contacted their pharmacy? Yes (Agent: If no, request that the patient contact the pharmacy for the refill. If patient does not wish to contact the pharmacy document the reason why and proceed with request.) (Agent: If yes, when and what did the pharmacy advise?) Advised to call provider office   Is this the correct pharmacy for this prescription? Yes If no, delete pharmacy and type the correct one.  This is the patient's preferred pharmacy:  [id number] - [place], [place] - [address] AT [facility] [address] [place] [address] Phone: [phone] Fax: [phone]   Has the prescription been filled recently? No  Is the patient out of the medication? Yes  Has the patient been seen for an appointment in the last year OR does the patient have an upcoming appointment? Yes  Can we respond through MyChart? No  Agent: Please be advised that Rx refills may take up to 3 business days. We ask that you follow-up with your pharmacy. >> Jan 29, 2024  2:37 PM Felizardo Hotter wrote: Pt called to check on status of medication sent to pharmacy.

## 2024-01-30 ENCOUNTER — Other Ambulatory Visit: Payer: Self-pay | Admitting: Family Medicine

## 2024-01-30 ENCOUNTER — Other Ambulatory Visit: Payer: Self-pay

## 2024-01-30 ENCOUNTER — Telehealth: Payer: Self-pay

## 2024-01-30 ENCOUNTER — Ambulatory Visit: Payer: Self-pay

## 2024-01-30 DIAGNOSIS — G43009 Migraine without aura, not intractable, without status migrainosus: Secondary | ICD-10-CM

## 2024-01-30 MED ORDER — BUTALBITAL-APAP-CAFFEINE 50-325-40 MG PO TABS: 1.0000 | ORAL_TABLET | Freq: Four times a day (QID) | ORAL | 2 refills | Status: DC | PRN

## 2024-01-30 NOTE — Telephone Encounter (Signed)
 Patient called in with complaints of migraine since last Thursday.Patient has requested refill on Butalbital -Acetaminophen  and is awaiting fill. Triage nurse tried to schedule her for today however nothing was available. I let the nurse know that I would send you a message. Triage nurse stated she would advise patient to go to a urgent care if she felt the need.

## 2024-01-30 NOTE — Telephone Encounter (Signed)
 Chief Complaint: headache Symptoms: see above Frequency: since last Thursday Pertinent Negatives: Patient denies dizziness, blurry vision, vomiting, unsteady gait, one-sided weakness, photosensitivity Disposition: [] ED /[x] Urgent Care (no appt availability in office) / [] Appointment(In office/virtual)/ []  [place] [facility]/ [] Home Care/ [] Refused Recommended Disposition /[] [facility]/ []  Follow-up with PCP Additional Notes: Pt reports migraine w/ 8/10 pain since last Thursday. Pt states it "hurts all over." Pt denies one-sided weakness, photosensitivity, visual changes, vomiting, dizziness, fever, body aches, joint pain, and stiffness. Pt states she experienced some nausea this morning but that has since resolved. Pt has requested a refill of butalbital -acetaminophen -caffeine  (FIORICET) 50-325-40 MG tablet [[id number]] and has been waiting for it to be filled.   RN called the CAL, CAL said they would advise the provider of the need for the medication. In the meantime, RN advised pt she should go to UC for her symptoms. Pt agreeable to do that but states her daughter will have to bring her and she won't be home until 1700. RN advised pt if she develops vomiting, weakness, dizziness, visual changes, or worsening she should call 911. Pt verbalized understanding.    Copied from CRM [id number]. Topic: Clinical - Red Word Triage >> Jan 30, 2024  3:47 PM Stanly Early wrote: Red Word that prompted transfer to Nurse Triage: pt is having severe headaches, is currently waiting on RX refill Reason for Disposition  [1] SEVERE headache (e.g., excruciating) AND [2] not improved after 2 hours of pain medicine  Answer Assessment - Initial Assessment Questions 1. LOCATION: "Where does it hurt?"      "All over" 2. ONSET: "When did the headache start?" (Minutes, hours or days)      Since last Thursday 3. PATTERN: "Does the pain come and go, or has it been constant since it started?"     Constant  4.  SEVERITY: "How bad is the pain?" and "What does it keep you from doing?"  (e.g., Scale 1-10; mild, moderate, or severe)   - MILD (1-3): doesn't interfere with normal activities    - MODERATE (4-7): interferes with normal activities or awakens from sleep    - SEVERE (8-10): excruciating pain, unable to do any normal activities        8/10 5. RECURRENT SYMPTOM: "Have you ever had headaches before?" If Yes, ask: "When was the last time?" and "What happened that time?"      Yes  6. CAUSE: "What do you think is causing the headache?"     Hx of migraine  7. MIGRAINE: "Have you been diagnosed with migraine headaches?" If Yes, ask: "Is this headache similar?"      Yes, takes Fioricet 8. HEAD INJURY: "Has there been any recent injury to the head?"      No 9. OTHER SYMPTOMS: "Do you have any other symptoms?" (fever, stiff neck, eye pain, sore throat, cold symptoms)     No light sensitivity. "Hurt all over." Got a little nauseas this AM but that has resolved. No fever. No joint pain or body aches. No stiffness.  Protocols used: [place]

## 2024-01-31 ENCOUNTER — Telehealth: Payer: Self-pay

## 2024-01-31 NOTE — Telephone Encounter (Signed)
 I have reached out to this patient twice this AM. No answer or voicemail. Will try back again later.

## 2024-01-31 NOTE — Telephone Encounter (Signed)
Attempted to reach patient again. No answer and no voicemail. °

## 2024-01-31 NOTE — Telephone Encounter (Signed)
 Tried calling patient, went straight to voicemail and cannot leave message due to no voicemail set up. Will try again later.

## 2024-02-05 ENCOUNTER — Ambulatory Visit: Admitting: Podiatry

## 2024-02-26 ENCOUNTER — Ambulatory Visit: Admitting: Family Medicine

## 2024-02-26 ENCOUNTER — Encounter: Payer: Self-pay | Admitting: Family Medicine

## 2024-02-26 VITALS — BP 140/72 | HR 75 | Temp 97.8°F | Resp 16 | Ht 62.5 in | Wt 184.0 lb

## 2024-02-26 DIAGNOSIS — D649 Anemia, unspecified: Secondary | ICD-10-CM

## 2024-02-26 DIAGNOSIS — E0859 Diabetes mellitus due to underlying condition with other circulatory complications: Secondary | ICD-10-CM

## 2024-02-26 DIAGNOSIS — E7849 Other hyperlipidemia: Secondary | ICD-10-CM

## 2024-02-26 DIAGNOSIS — G43011 Migraine without aura, intractable, with status migrainosus: Secondary | ICD-10-CM | POA: Diagnosis not present

## 2024-02-26 DIAGNOSIS — E038 Other specified hypothyroidism: Secondary | ICD-10-CM

## 2024-02-26 DIAGNOSIS — Z89612 Acquired absence of left leg above knee: Secondary | ICD-10-CM

## 2024-02-26 DIAGNOSIS — I10 Essential (primary) hypertension: Secondary | ICD-10-CM

## 2024-02-26 DIAGNOSIS — I69354 Hemiplegia and hemiparesis following cerebral infarction affecting left non-dominant side: Secondary | ICD-10-CM | POA: Insufficient documentation

## 2024-02-26 MED ORDER — KETOROLAC TROMETHAMINE 60 MG/2ML IM SOLN
60.0000 mg | Freq: Once | INTRAMUSCULAR | Status: AC
  Administered 2024-02-26: 60 mg via INTRAMUSCULAR

## 2024-02-26 NOTE — Assessment & Plan Note (Signed)
 Well controlled.  Lab Results  Component Value Date   LDLCALC 69 07/31/2023   No changes to medicines.  Continue simvastatin  and Zetia. Continue to work on eating a healthy diet and exercise.  Labs drawn today.

## 2024-02-26 NOTE — Progress Notes (Signed)
 Subjective:  Patient ID: Anna Hayes, female    DOB: 1951-07-07  Age: 73 y.o. MRN: [id number]  Chief Complaint  Patient presents with   Migraine    HOME HEALTH REFERRAL   Discussed the use of AI scribe software for clinical note transcription with the patient, who gave verbal consent to proceed.  History of Present Illness   Anna Hayes is a 73 year old female with a history of high blood pressure who presents with a severe headache.  She has been experiencing a severe headache for the past three days, with pain located behind her eyes and radiating down her neck. The headache is severe enough to affect her mood. She has tried Tylenol  without relief and mentions that Fioricet, which she could not refill until today, has been effective in the past. She received a Toradol  injection during her last visit, which provided relief, and she wants another injection.   She has a history of sinus issues, describing her sinuses as 'horrible,' but denies any recent sinus infections. She experiences pain behind her eyes but denies sinus pain or pressure. She is allergic to many things but does not take allergy medication.  She is requesting home health assistance for tasks such as grocery shopping, changing bed sheets, and other errands. She specifies a preference for a mature caregiver who can assist with lifting her wheelchair and helping with bed changes and errands. She lives independently but requires assistance due to mobility issues.  She has a history of high blood pressure, which she attributes to her headache. She confirms taking her blood pressure medication this morning. No chest pain or shortness of breath.        04/27/2023   11:16 AM 10/13/2022    1:52 PM 07/27/2022   10:49 AM 06/29/2022    9:30 AM 07/23/2021   10:41 AM  Depression screen PHQ 2/9  Decreased Interest 0 0 0 0 0  Down, Depressed, Hopeless 0 0 0 0 0  PHQ - 2 Score 0 0 0 0 0  Altered sleeping 0  0 0    Tired, decreased energy 0  0 0   Change in appetite 0  0 0   Feeling bad or failure about yourself  0  0 0   Trouble concentrating 0  0 0   Moving slowly or fidgety/restless 0  0 0   Suicidal thoughts 0  0 0   PHQ-9 Score 0  0 0   Difficult doing work/chores Not difficult at all  Not difficult at all Not difficult at all         04/27/2023   11:16 AM  Fall Risk   Falls in the past year? 0  Number falls in past yr: 0  Injury with Fall? 0  Risk for fall due to : Impaired mobility  Follow up Falls evaluation completed;Falls prevention discussed    Patient Care Team: Janece Means, FNP as PCP - General (Family Medicine)   Review of Systems  Constitutional:  Negative for chills, diaphoresis, fatigue and fever.  HENT:  Negative for congestion, ear pain and sinus pain.   Eyes: Negative.   Respiratory:  Negative for cough, shortness of breath and wheezing.   Cardiovascular:  Negative for chest pain and palpitations.  Gastrointestinal:  Negative for abdominal pain, constipation, diarrhea, nausea and vomiting.  Endocrine: Negative.   Genitourinary:  Negative for dysuria, frequency and urgency.  Musculoskeletal:  Negative for arthralgias.  Allergic/Immunologic: Negative.  Neurological:  Positive for headaches. Negative for dizziness, weakness and light-headedness.  Psychiatric/Behavioral:  Negative for dysphoric mood. The patient is not nervous/anxious.     Current Outpatient Medications on File Prior to Visit  Medication Sig Dispense Refill   atenolol  (TENORMIN ) 50 MG tablet TAKE 1 TABLET BY MOUTH EVERY DAY 90 tablet 1   butalbital -acetaminophen -caffeine  (FIORICET) 50-325-40 MG tablet Take 1 tablet by mouth every 6 (six) hours as needed for migraine. 30 tablet 2   fluticasone  (FLONASE ) 50 MCG/ACT nasal spray SPRAY 2 SPRAYS INTO EACH NOSTRIL EVERY DAY 48 mL 2   furosemide (LASIX) 20 MG tablet Take 20 mg by mouth daily as needed for fluid.      levothyroxine  (SYNTHROID ) 50 MCG  tablet TAKE 1 TABLET BY MOUTH EVERY DAY 90 tablet 1   LINZESS  145 MCG CAPS capsule TAKE 1 CAPSULE BY MOUTH EVERY DAY BEFORE BREAKFAST 90 capsule 1   losartan  (COZAAR ) 50 MG tablet Take 1 tablet (50 mg total) by mouth daily. 90 tablet 1   NIFEdipine  (PROCARDIA  XL) 30 MG 24 hr tablet Take 1 tablet (30 mg total) by mouth daily. 90 tablet 1   omega-3 acid ethyl esters (LOVAZA ) 1 g capsule TAKE 2 CAPSULES BY MOUTH 2 TIMES DAILY. 360 capsule 1   omeprazole  (PRILOSEC) 40 MG capsule Take 1 capsule (40 mg total) by mouth daily. 90 capsule 1   ondansetron  (ZOFRAN ) 4 MG tablet Take 1 tablet (4 mg total) by mouth every 8 (eight) hours as needed for nausea or vomiting. 90 tablet 1   rivaroxaban  (XARELTO ) 20 MG TABS tablet Take 1 tablet (20 mg total) by mouth daily with supper. 90 tablet 1   simvastatin  (ZOCOR ) 40 MG tablet TAKE 1 TABLET BY MOUTH EVERY DAY 90 tablet 1   cyanocobalamin  (,VITAMIN B-12,) 1000 MCG/ML injection Inject into the muscle. (Patient not taking: Reported on 02/26/2024)     No current facility-administered medications on file prior to visit.   Past Medical History:  Diagnosis Date   Anemia    Constipation    DVT of lower extremity (deep venous thrombosis) (HCC)    Frequent headaches    Heart murmur    found during pregnancy -  no problems   Hypertension    Muscle pain    Palpitations    Septic thrombophlebitis 08/31/2017   Sinus problem    Stroke [facility])    Past Surgical History:  Procedure Laterality Date   ABDOMINAL EXPLORATION SURGERY  05/2017   LOA and release of SBO, repair of small bowel perforation.   AMPUTATION Left 07/05/2017   Procedure: LEFT ABOVE KNEE AMPUTATION;  Surgeon: Arvil Lauber, MD;  Location: [facility] OR;  Service: Vascular;  Laterality: Left;   CATARACT EXTRACTION W/PHACO Right 06/12/2018   Procedure: CATARACT EXTRACTION WITH INTRAOCULAR LENS IMPLANT;  Surgeon: Jearline Minder, MD;  Location: [facility] OR;  Service: Ophthalmology;  Laterality: Right;   COLONOSCOPY   11/26/2015   Dr Monico Anna - follow-up in 5 years   EMBOLECTOMY Bilateral 06/27/2017   Procedure: BILATERAL FEMORAL POPLITEAL EMBOLECTOMY;  Surgeon: Margherita Shell, MD;  Location: [facility] OR;  Service: Vascular;  Laterality: Bilateral;   FASCIOTOMY Left 06/27/2017   Procedure: FULL COMPARTMENT LEFT LOWER LEG FASCIOTOMY;  Surgeon: Margherita Shell, MD;  Location: [facility] OR;  Service: Vascular;  Laterality: Left;   PARTIAL HYSTERECTOMY     PATCH ANGIOPLASTY Left 06/27/2017   Procedure: PATCH ANGIOPLASTY Left Posterior Tibial Artery;  Surgeon: Margherita Shell, MD;  Location: [facility] OR;  Service: Vascular;  Laterality: Left;   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 07/11/2017   Procedure: REPAIR OF COMPLEX TRACTION RETINAL DETACHMENT WITH ENDO LASER AND ANTIFUNGAL INJECTION;  Surgeon: Jearline Minder, MD;  Location: [facility] OR;  Service: Ophthalmology;  Laterality: Right;   TEE WITHOUT CARDIOVERSION N/A 07/11/2017   Procedure: TRANSESOPHAGEAL ECHOCARDIOGRAM (TEE);  Surgeon: Alroy Aspen Lela Purple, MD;  Location: [facility] ENDOSCOPY;  Service: Cardiovascular;  Laterality: N/A;    Family History  Problem Relation Age of Onset   Breast cancer Mother    Prostate cancer Father    Prostate cancer Brother    Social History   Socioeconomic History   Marital status: Widowed    Spouse name: Not on file   Number of children: Not on file   Years of education: Not on file   Highest education level: Not on file  Occupational History   Not on file  Tobacco Use   Smoking status: Some Days    Current packs/day: 0.25    Average packs/day: 0.3 packs/day for 53.0 years (13.3 ttl pk-yrs)    Types: Cigarettes   Smokeless tobacco: Never  Vaping Use   Vaping status: Never Used  Substance and Sexual Activity   Alcohol use: No   Drug use: No   Sexual activity: Not Currently  Other Topics Concern   Not on file  Social History Narrative   Lives with her daughter   Social Drivers of Health   Financial Resource  Strain: Low Risk  (07/31/2023)   Overall Financial Resource Strain (CARDIA)    Difficulty of Paying Living Expenses: Not hard at all  Food Insecurity: No Food Insecurity (07/31/2023)   Hunger Vital Sign    Worried About Running Out of Food in the Last Year: Never true    Ran Out of Food in the Last Year: Never true  Transportation Needs: No Transportation Needs (01/11/2023)   PRAPARE - Administrator, Civil Service (Medical): No    Lack of Transportation (Non-Medical): No  Physical Activity: Insufficiently Active (07/31/2023)   Exercise Vital Sign    Days of Exercise per Week: 1 day    Minutes of Exercise per Session: 60 min  Stress: No Stress Concern Present (07/31/2023)   [employer] of Occupational Health - Occupational Stress Questionnaire    Feeling of Stress : Not at all  Social Connections: Moderately Isolated (07/31/2023)   Social Connection and Isolation Panel [NHANES]    Frequency of Communication with Friends and Family: More than three times a week    Frequency of Social Gatherings with Friends and Family: More than three times a week    Attends Religious Services: 1 to 4 times per year    Active Member of [employer] or Organizations: No    Attends Banker Meetings: Never    Marital Status: Widowed    Objective:  BP (!) 140/72   Pulse 75   Temp 97.8 F (36.6 C) (Temporal)   Resp 16   Ht 5' 2.5" (1.588 m)   Wt 184 lb (83.5 kg)   SpO2 96%   BMI 33.12 kg/m      02/26/2024   10:14 AM 02/26/2024    9:40 AM 01/01/2024   10:04 AM  BP/Weight  Systolic BP 140 148 138  Diastolic BP 72 72 68  Wt. (Lbs)  184 184  BMI  33.12 kg/m2 33.12 kg/m2    Physical Exam Vitals reviewed.  Constitutional:      General: She  is not in acute distress.    Appearance: Normal appearance. She is not ill-appearing.  HENT:     Right Ear: Tympanic membrane normal.     Left Ear: Tympanic membrane normal.     Nose:     Right Sinus: Frontal sinus tenderness present. No  maxillary sinus tenderness.     Left Sinus: Frontal sinus tenderness present. No maxillary sinus tenderness.  Eyes:     Conjunctiva/sclera: Conjunctivae normal.  Cardiovascular:     Rate and Rhythm: Normal rate and regular rhythm.     Heart sounds: Normal heart sounds. No murmur heard. Pulmonary:     Effort: Pulmonary effort is normal.     Breath sounds: Normal breath sounds. No wheezing.  Skin:    General: Skin is warm.  Neurological:     Mental Status: She is alert. Mental status is at baseline.  Psychiatric:        Mood and Affect: Mood normal.        Behavior: Behavior normal.    Lab Results  Component Value Date   WBC 6.1 07/31/2023   HGB 10.9 (L) 07/31/2023   HCT 35.5 07/31/2023   PLT 473 (H) 07/31/2023   GLUCOSE 90 07/31/2023   CHOL 143 07/31/2023   TRIG 124 07/31/2023   HDL 52 07/31/2023   LDLCALC 69 07/31/2023   ALT 21 07/31/2023   AST 20 07/31/2023   NA 136 07/31/2023   K 5.4 (H) 07/31/2023   CL 100 07/31/2023   CREATININE 0.85 07/31/2023   BUN 14 07/31/2023   CO2 22 07/31/2023   TSH 2.200 10/13/2022   INR 1.3 (H) 12/08/2022      Assessment & Plan:  Hx of AKA (above knee amputation), left (HCC) Assessment & Plan: Requires assistance with daily tasks. Prefers experienced caregiver. - Order home health services for assistance. - Currently has skilled nursing, but requests home health aide. She will need to check with that agency to see if they provide a home health aide as well.    Orders: -     Ambulatory referral to Home Health  Intractable migraine without aura and with status migrainosus Assessment & Plan: Severe headache unresponsive to acetaminophen . Patient has not been able to pick up her Fioricet Rx because she didn't have the money to get it. High blood pressure possibly related. - Administer Toradol  injection. - Provide nasal spray Zavzspret sample. - Instruct to fill Fioricet prescription when she can afford to.   Orders: -      Ketorolac  Tromethamine   Anemia, unspecified type Assessment & Plan: Lab Results  Component Value Date   WBC 6.1 07/31/2023   HGB 10.9 (L) 07/31/2023   HCT 35.5 07/31/2023   MCV 80 07/31/2023   PLT 473 (H) 07/31/2023  History of anemia  Labs drawn, Await labs/testing for assessment and recommendations   Orders: -     CBC with Differential/Platelet  Other hyperlipidemia Assessment & Plan: Well controlled.  Lab Results  Component Value Date   LDLCALC 69 07/31/2023   No changes to medicines.  Continue simvastatin  and Zetia. Continue to work on eating a healthy diet and exercise.  Labs drawn today.    Orders: -     Lipid panel  Secondary hypothyroidism Assessment & Plan: Previously well controlled Lab Results  Component Value Date   TSH 2.200 10/13/2022    Continue Synthroid  at current dose  Recheck TSH and adjust Synthroid  as indicated    Orders: -     TSH  Diabetes due to underlying condition w oth circulatory comp [facility]) Assessment & Plan: Well controlled Labs drawn, Await labs/testing for assessment and recommendations   Orders: -     Hemoglobin A1c  Benign essential HTN Assessment & Plan: Elevated blood pressure possibly related to headache. Antihypertensive medication taken this morning. BP Readings from Last 3 Encounters:  02/26/24 (!) 140/72  01/01/24 138/68  07/31/23 130/72   - Encourage adherence to antihypertensive medication.  Orders: -     Comprehensive metabolic panel with GFR      Meds ordered this encounter  Medications   ketorolac  (TORADOL ) injection 60 mg    Orders Placed This Encounter  Procedures   Comprehensive metabolic panel with GFR   CBC with Differential   Lipid Panel   TSH   Hemoglobin A1c   Ambulatory referral to Home Health   Assessment and Plan    Headache Severe headache unresponsive to acetaminophen , Fioricet, and Nurtec. High blood pressure possibly related. - Administer Toradol  injection. - Provide  nasal spray sample. - Instruct to fill Fioricet prescription.  Hypertension Elevated blood pressure possibly related to headache. Antihypertensive medication taken this morning. - Encourage adherence to antihypertensive medication.  Need for assistance with daily activities Requires assistance with daily tasks. Prefers experienced caregiver. - Order home health services for assistance.  General Health Maintenance Pending immunizations and screenings. Tdap, shingles vaccines, and colonoscopy recommended. - Instruct to inquire about Tdap and shingles vaccines at pharmacy. - Schedule GI appointment for colonoscopy.       Follow-up: Return in about 3 months (around 05/28/2024) for chronic.  An After Visit Summary was printed and given to the patient.  Delford Felling, FNP [facility] [phone]

## 2024-02-26 NOTE — Assessment & Plan Note (Signed)
 Requires assistance with daily tasks. Prefers experienced caregiver. - Order home health services for assistance. - Currently has skilled nursing, but requests home health aide. She will need to check with that agency to see if they provide a home health aide as well.

## 2024-02-26 NOTE — Assessment & Plan Note (Signed)
 Elevated blood pressure possibly related to headache. Antihypertensive medication taken this morning. BP Readings from Last 3 Encounters:  02/26/24 (!) 140/72  01/01/24 138/68  07/31/23 130/72   - Encourage adherence to antihypertensive medication.

## 2024-02-26 NOTE — Assessment & Plan Note (Signed)
 Lab Results  Component Value Date   WBC 6.1 07/31/2023   HGB 10.9 (L) 07/31/2023   HCT 35.5 07/31/2023   MCV 80 07/31/2023   PLT 473 (H) 07/31/2023  History of anemia  Labs drawn, Await labs/testing for assessment and recommendations

## 2024-02-26 NOTE — Assessment & Plan Note (Signed)
 Severe headache unresponsive to acetaminophen . Patient has not been able to pick up her Fioricet Rx because she didn't have the money to get it. High blood pressure possibly related. - Administer Toradol  injection. - Provide nasal spray Zavzspret sample. - Instruct to fill Fioricet prescription when she can afford to.

## 2024-02-26 NOTE — Assessment & Plan Note (Signed)
 Well controlled Labs drawn, Await labs/testing for assessment and recommendations

## 2024-02-26 NOTE — Assessment & Plan Note (Signed)
 Previously well controlled Lab Results  Component Value Date   TSH 2.200 10/13/2022    Continue Synthroid  at current dose  Recheck TSH and adjust Synthroid  as indicated

## 2024-02-27 ENCOUNTER — Ambulatory Visit: Admitting: Podiatry

## 2024-02-27 ENCOUNTER — Ambulatory Visit: Payer: Self-pay | Admitting: Family Medicine

## 2024-02-27 LAB — LIPID PANEL
Chol/HDL Ratio: 2.8 ratio (ref 0.0–4.4)
Cholesterol, Total: 149 mg/dL (ref 100–199)
HDL: 54 mg/dL (ref >39)
LDL Chol Calc (NIH): 67 mg/dL (ref 0–99)
Triglycerides: 165 mg/dL — ABNORMAL HIGH (ref 0–149)
VLDL Cholesterol Cal: 28 mg/dL (ref 5–40)

## 2024-02-27 LAB — CBC WITH DIFFERENTIAL/PLATELET
Basophils Absolute: 0.1 10*3/uL (ref 0.0–0.2)
Basos: 1 %
EOS (ABSOLUTE): 0.4 10*3/uL (ref 0.0–0.4)
Eos: 5 %
Hematocrit: 35.7 % (ref 34.0–46.6)
Hemoglobin: 11.2 g/dL (ref 11.1–15.9)
Immature Grans (Abs): 0 10*3/uL (ref 0.0–0.1)
Immature Granulocytes: 0 %
Lymphocytes Absolute: 1.7 10*3/uL (ref 0.7–3.1)
Lymphs: 25 %
MCH: 25.6 pg — ABNORMAL LOW (ref 26.6–33.0)
MCHC: 31.4 g/dL — ABNORMAL LOW (ref 31.5–35.7)
MCV: 82 fL (ref 79–97)
Monocytes Absolute: 0.8 10*3/uL (ref 0.1–0.9)
Monocytes: 11 %
Neutrophils Absolute: 3.9 10*3/uL (ref 1.4–7.0)
Neutrophils: 58 %
Platelets: 459 10*3/uL — ABNORMAL HIGH (ref 150–450)
RBC: 4.38 x10E6/uL (ref 3.77–5.28)
RDW: 15.5 % — ABNORMAL HIGH (ref 11.7–15.4)
WBC: 6.8 10*3/uL (ref 3.4–10.8)

## 2024-02-27 LAB — COMPREHENSIVE METABOLIC PANEL WITH GFR
ALT: 16 IU/L (ref 0–32)
AST: 18 IU/L (ref 0–40)
Albumin: 4.3 g/dL (ref 3.8–4.8)
Alkaline Phosphatase: 85 IU/L (ref 44–121)
BUN/Creatinine Ratio: 14 (ref 12–28)
BUN: 12 mg/dL (ref 8–27)
Bilirubin Total: <0.2 mg/dL (ref 0.0–1.2)
CO2: 23 mmol/L (ref 20–29)
Calcium: 10.1 mg/dL (ref 8.7–10.3)
Chloride: 102 mmol/L (ref 96–106)
Creatinine, Ser: 0.85 mg/dL (ref 0.57–1.00)
Globulin, Total: 2.9 g/dL (ref 1.5–4.5)
Glucose: 96 mg/dL (ref 70–99)
Potassium: 5.1 mmol/L (ref 3.5–5.2)
Sodium: 141 mmol/L (ref 134–144)
Total Protein: 7.2 g/dL (ref 6.0–8.5)
eGFR: 71 mL/min/{1.73_m2} (ref >59)

## 2024-02-27 LAB — TSH: TSH: 2.45 u[IU]/mL (ref 0.450–4.500)

## 2024-02-27 LAB — HEMOGLOBIN A1C
Est. average glucose Bld gHb Est-mCnc: 123 mg/dL
Hgb A1c MFr Bld: 5.9 % — ABNORMAL HIGH (ref 4.8–5.6)

## 2024-03-11 ENCOUNTER — Encounter: Payer: Self-pay | Admitting: Podiatry

## 2024-03-11 ENCOUNTER — Ambulatory Visit: Admitting: Podiatry

## 2024-03-11 DIAGNOSIS — Z7901 Long term (current) use of anticoagulants: Secondary | ICD-10-CM | POA: Diagnosis not present

## 2024-03-11 DIAGNOSIS — Z89612 Acquired absence of left leg above knee: Secondary | ICD-10-CM

## 2024-03-11 DIAGNOSIS — B351 Tinea unguium: Secondary | ICD-10-CM

## 2024-03-11 DIAGNOSIS — M79674 Pain in right toe(s): Secondary | ICD-10-CM

## 2024-03-11 NOTE — Progress Notes (Unsigned)
  Subjective:  Patient ID: Anna Hayes, female    DOB: June 17, 1951,  MRN: [id number]  Chief Complaint  Patient presents with   Nail Problem    I believe the right hallux in ingrown, but all 5 nails are really thick and long. She states it hurts for even the sock to touch it. Not diabetic and takes Xarelto     73 y.o. female presents with the above complaint. History confirmed with patient. Patient presenting with pain related to dystrophic thickened elongated nails. Patient is unable to trim own nails related to nail dystrophy and/or mobility issues.  She comes in for at risk footcare due to prior left above-knee amputation from blood clot.  The right 1st and 2nd toenails are especially dystrophic and painful.  Objective:  Physical Exam: warm to cool, good capillary refill, diminished pedal hair growth nail exam onychomycosis of the toenails, onycholysis, dystrophic nails, and right 1st and 2nd toenail especially tender with dystrophic changes and some incurvation to the nails. DP pulse palpable, DP pulse faintly palpable right side and protective sensation intact Left lower extremity: History of above-knee amputation Right Foot: Pain with palpation of nails due to elongation and dystrophic growth.  No symptomatic limitations in pedal range of motion.  Assessment:   1. History of above knee amputation, left (HCC)   2. Pain due to onychomycosis of toenail of right foot   3. Chronic anticoagulation      Plan:  Patient was evaluated and treated and all questions answered.  #Onychomycosis with pain  -Nails palliatively debrided as below. -Educated on self-care - On chronic Xarelto   Procedure: Nail Debridement Rationale: Pain Type of Debridement: manual, sharp debridement. Instrumentation: Nail nipper, rotary burr. Number of Nails: 5  # History of above-knee amputation # Chronic anticoagulation on Xarelto  - History of DVT - Had left lower extremity amputated secondary to  blood clot - Professional footcare necessary due to patient being high risk -I certify that this diagnosis represents a distinct and separate diagnosis that requires evaluation and treatment separate from other procedures or diagnosis   Return in about 3 months (around 06/11/2024) for Routine Foot Care.         Eve Hinders, DPM [facility] / [facility]

## 2024-04-03 DIAGNOSIS — Z809 Family history of malignant neoplasm, unspecified: Secondary | ICD-10-CM | POA: Diagnosis not present

## 2024-04-03 DIAGNOSIS — Z880 Allergy status to penicillin: Secondary | ICD-10-CM | POA: Diagnosis not present

## 2024-04-03 DIAGNOSIS — G43909 Migraine, unspecified, not intractable, without status migrainosus: Secondary | ICD-10-CM | POA: Diagnosis not present

## 2024-04-03 DIAGNOSIS — Z5982 Transportation insecurity: Secondary | ICD-10-CM | POA: Diagnosis not present

## 2024-04-03 DIAGNOSIS — Z7901 Long term (current) use of anticoagulants: Secondary | ICD-10-CM | POA: Diagnosis not present

## 2024-04-03 DIAGNOSIS — R32 Unspecified urinary incontinence: Secondary | ICD-10-CM | POA: Diagnosis not present

## 2024-04-03 DIAGNOSIS — N182 Chronic kidney disease, stage 2 (mild): Secondary | ICD-10-CM | POA: Diagnosis not present

## 2024-04-03 DIAGNOSIS — Z8249 Family history of ischemic heart disease and other diseases of the circulatory system: Secondary | ICD-10-CM | POA: Diagnosis not present

## 2024-04-03 DIAGNOSIS — K5900 Constipation, unspecified: Secondary | ICD-10-CM | POA: Diagnosis not present

## 2024-04-03 DIAGNOSIS — I69334 Monoplegia of upper limb following cerebral infarction affecting left non-dominant side: Secondary | ICD-10-CM | POA: Diagnosis not present

## 2024-04-03 DIAGNOSIS — E785 Hyperlipidemia, unspecified: Secondary | ICD-10-CM | POA: Diagnosis not present

## 2024-04-03 DIAGNOSIS — J309 Allergic rhinitis, unspecified: Secondary | ICD-10-CM | POA: Diagnosis not present

## 2024-04-25 ENCOUNTER — Other Ambulatory Visit: Payer: Self-pay | Admitting: Family Medicine

## 2024-04-25 DIAGNOSIS — G43009 Migraine without aura, not intractable, without status migrainosus: Secondary | ICD-10-CM

## 2024-04-25 MED ORDER — BUTALBITAL-APAP-CAFFEINE 50-325-40 MG PO TABS: 1.0000 | ORAL_TABLET | Freq: Four times a day (QID) | ORAL | 2 refills | Status: DC | PRN

## 2024-04-25 NOTE — Telephone Encounter (Signed)
 Copied from CRM [id number]. Topic: Clinical - Medication Refill >> Apr 25, 2024  9:12 AM Willma R wrote: Medication: butalbital -acetaminophen -caffeine  (FIORICET) 50-325-40 MG tablet  Has the patient contacted their pharmacy? No, states bottle says no refills  This is the patient's preferred pharmacy:  [id number] - [place], [place] - [address] AT [facility] [address] [place] [address] Phone: [phone] Fax: [phone]  Is this the correct pharmacy for this prescription? Yes If no, delete pharmacy and type the correct one.   Has the prescription been filled recently? No  Is the patient out of the medication? Yes  Has the patient been seen for an appointment in the last year OR does the patient have an upcoming appointment? Yes  Can we respond through MyChart? No  Agent: Please be advised that Rx refills may take up to 3 business days. We ask that you follow-up with your pharmacy.

## 2024-05-28 ENCOUNTER — Ambulatory Visit (INDEPENDENT_AMBULATORY_CARE_PROVIDER_SITE_OTHER): Admitting: Family Medicine

## 2024-05-28 ENCOUNTER — Encounter: Payer: Self-pay | Admitting: Family Medicine

## 2024-05-28 ENCOUNTER — Other Ambulatory Visit: Payer: Self-pay | Admitting: Family Medicine

## 2024-05-28 VITALS — BP 124/62 | HR 69 | Temp 98.5°F | Resp 18 | Ht 62.5 in | Wt 184.0 lb

## 2024-05-28 DIAGNOSIS — G43009 Migraine without aura, not intractable, without status migrainosus: Secondary | ICD-10-CM

## 2024-05-28 DIAGNOSIS — D75839 Thrombocytosis, unspecified: Secondary | ICD-10-CM

## 2024-05-28 DIAGNOSIS — E7849 Other hyperlipidemia: Secondary | ICD-10-CM | POA: Diagnosis not present

## 2024-05-28 DIAGNOSIS — R7303 Prediabetes: Secondary | ICD-10-CM | POA: Diagnosis not present

## 2024-05-28 DIAGNOSIS — E0859 Diabetes mellitus due to underlying condition with other circulatory complications: Secondary | ICD-10-CM | POA: Diagnosis not present

## 2024-05-28 DIAGNOSIS — S78112A Complete traumatic amputation at level between left hip and knee, initial encounter: Secondary | ICD-10-CM

## 2024-05-28 DIAGNOSIS — I69354 Hemiplegia and hemiparesis following cerebral infarction affecting left non-dominant side: Secondary | ICD-10-CM | POA: Insufficient documentation

## 2024-05-28 MED ORDER — KETOROLAC TROMETHAMINE 60 MG/2ML IM SOLN
60.0000 mg | Freq: Once | INTRAMUSCULAR | Status: AC
  Administered 2024-05-28: 60 mg via INTRAMUSCULAR

## 2024-05-28 MED ORDER — UBRELVY 50 MG PO TABS: 50.0000 mg | ORAL_TABLET | Freq: Every day | ORAL | 0 refills | Status: DC | PRN

## 2024-05-28 MED ORDER — BUTALBITAL-APAP-CAFFEINE 50-325-40 MG PO TABS: 1.0000 | ORAL_TABLET | Freq: Four times a day (QID) | ORAL | 2 refills | Status: DC | PRN

## 2024-05-28 NOTE — Patient Instructions (Signed)
  VISIT SUMMARY: Today, you came in for a follow-up on your abnormal lab results and to discuss the management of your migraines. We reviewed your elevated triglycerides, platelet count, and hemoglobin A1c levels. We also discussed your current migraine treatment and potential new options. Additionally, we talked about your need for assistance with transportation and errands due to your disability.  YOUR PLAN: -MIGRAINE WITHOUT AURA: You have chronic migraines that you currently manage with Fioricet, but there is a risk of rebound headaches due to the caffeine  in it. We will provide you with a sample of Ubrelvy  to try as an alternative. You also received a Toradol  injection today for immediate relief, and we will ensure your Fioricet prescription is refilled.  -ASPIRIN INTOLERANCE: You experience stomach discomfort when taking aspirin, but you tolerate Toradol  well. Today, you received a Toradol  injection for your migraine relief.  -THROMBOCYTOSIS: Your platelet count is elevated, likely due to nicotine use. We will continue to monitor this condition.  -NICOTINE DEPENDENCE: Your ongoing nicotine use is contributing to your elevated platelet count. We recommend considering smoking cessation options to improve your overall health.  -PREDIABETES: Your hemoglobin A1c level is 5.9%, which indicates prediabetes. We will monitor this condition and recommend lifestyle modifications to help manage it. A repeat hemoglobin A1c test has been ordered.  -ELEVATED TRIGLYCERIDES: Your triglyceride levels were previously elevated. We will re-evaluate this by ordering a repeat triglyceride level test.  INSTRUCTIONS: Please follow up with the repeat hemoglobin A1c and triglyceride level tests as ordered. Additionally, try the sample of Ubrelvy  for your migraines and let us  know how it works for you. If you have any questions about your application for home health services or need further assistance with transportation  and errands, please let us  know.                                  Contains text generated by Abridge.

## 2024-05-28 NOTE — Assessment & Plan Note (Signed)
 Migraine without aura Chronic migraines managed with Fioricet, risk of rebound headaches due to caffeine . Ubrelvy  not yet trialed. She has tried Nurtec and did not like the side effects. - Provide sample of Ubrelvy  for trial.  - Administer Toradol  injection for acute migraine relief. - Ensure Fioricet prescription is refilled.

## 2024-05-28 NOTE — Assessment & Plan Note (Signed)
 Thrombocytosis Elevated platelet count likely secondary to nicotine use. - labs drawn, Await labs/testing for assessment and recommendations

## 2024-05-28 NOTE — Assessment & Plan Note (Deleted)
 Prediabetes Hemoglobin A1c at 5.9%. Monitoring and lifestyle modifications needed. - Order repeat hemoglobin A1c.

## 2024-05-28 NOTE — Assessment & Plan Note (Signed)
 Well controlled.  Lab Results  Component Value Date   LDLCALC 67 02/26/2024   No changes to medicines.  Continue simvastatin  and Zetia. Continue to work on eating a healthy diet and exercise.  Labs drawn today.

## 2024-05-28 NOTE — Assessment & Plan Note (Signed)
 Condition stable - Referrals for home health have not been successfully achieved.  - Patient needs help with transportation and help with making her bed. - New referral sent requesting help - Care management for SDOH referral created.

## 2024-05-28 NOTE — Assessment & Plan Note (Signed)
 Hemoglobin A1c at 5.9%. Monitoring and lifestyle modifications needed. - Order repeat hemoglobin A1c.

## 2024-05-28 NOTE — Progress Notes (Signed)
 Subjective:  Patient ID: Anna Hayes, female    DOB: [birth year]  Age: 73 y.o. MRN: [id number]  Chief Complaint  Patient presents with   Medical Management of Chronic Issues   Discussed the use of AI scribe software for clinical note transcription with the patient, who gave verbal consent to proceed.  History of Present Illness   Anna Hayes is a 73 year old female with elevated triglycerides and prediabetes who presents for follow-up on abnormal lab results and management of migraines.  She is following up on abnormal lab results, specifically elevated triglycerides and platelets. Her triglycerides were elevated on her cholesterol panel, and her platelets have been consistently elevated. Her hemoglobin A1c was in the prediabetic range at 5.9.   She has experienced migraines since age 37. She currently uses Fioricet for migraine management, which she finds effective, but is concerned about rebound headaches from caffeine . She has previously tried Nurtec, which caused adverse effects, but has not tried Ubrelvy .  She is interested in assistance with transportation and errands due to her disability. Her daughter, who works full-time, suggested she enroll in a program for people with disabilities to receive help with errands and transportation. She has not received any communication regarding her application for home health services and is unsure if her insurance covers it.  She takes Xarelto  in the afternoon, usually after supper, and has received Toradol  injections in the past without issues despite an aspirin sensitivity that causes stomach discomfort.  She reports a headache 8/10 pain with mild neck pain and maxillary and frontal tenderness. Denies ear pain and has only taken her prescribed medication for relief.         04/27/2023   11:16 AM 10/13/2022    1:52 PM 07/27/2022   10:49 AM 06/29/2022    9:30 AM 07/23/2021   10:41 AM  Depression screen PHQ 2/9  Decreased  Interest 0 0 0 0 0  Down, Depressed, Hopeless 0 0 0 0 0  PHQ - 2 Score 0 0 0 0 0  Altered sleeping 0  0 0   Tired, decreased energy 0  0 0   Change in appetite 0  0 0   Feeling bad or failure about yourself  0  0 0   Trouble concentrating 0  0 0   Moving slowly or fidgety/restless 0  0 0   Suicidal thoughts 0  0 0   PHQ-9 Score 0  0 0   Difficult doing work/chores Not difficult at all  Not difficult at all Not difficult at all         04/27/2023   11:16 AM  Fall Risk   Falls in the past year? 0  Number falls in past yr: 0  Injury with Fall? 0  Risk for fall due to : Impaired mobility  Follow up Falls evaluation completed;Falls prevention discussed    Patient Care Team: Teressa Harrie HERO, FNP as PCP - General (Family Medicine)   Review of Systems  Constitutional:  Negative for chills, diaphoresis, fatigue and fever.  HENT:  Negative for congestion, ear pain and sinus pain.   Eyes: Negative.   Respiratory:  Negative for cough and shortness of breath.   Cardiovascular:  Negative for chest pain and palpitations.  Gastrointestinal:  Negative for abdominal pain, constipation, diarrhea, nausea and vomiting.  Endocrine: Negative.   Genitourinary:  Negative for dysuria, frequency and urgency.  Musculoskeletal:  Negative for arthralgias.  Allergic/Immunologic: Negative.   Neurological:  Positive  for headaches. Negative for dizziness, weakness and light-headedness.  Psychiatric/Behavioral:  Negative for dysphoric mood. The patient is not nervous/anxious.     Current Outpatient Medications on File Prior to Visit  Medication Sig Dispense Refill   atenolol  (TENORMIN ) 50 MG tablet TAKE 1 TABLET BY MOUTH EVERY DAY 90 tablet 1   fluticasone  (FLONASE ) 50 MCG/ACT nasal spray SPRAY 2 SPRAYS INTO EACH NOSTRIL EVERY DAY 48 mL 2   furosemide (LASIX) 20 MG tablet Take 20 mg by mouth daily as needed for fluid.      levothyroxine  (SYNTHROID ) 50 MCG tablet TAKE 1 TABLET BY MOUTH EVERY DAY 90 tablet 1    LINZESS  145 MCG CAPS capsule TAKE 1 CAPSULE BY MOUTH EVERY DAY BEFORE BREAKFAST 90 capsule 1   losartan  (COZAAR ) 50 MG tablet Take 1 tablet (50 mg total) by mouth daily. 90 tablet 1   NIFEdipine  (PROCARDIA  XL) 30 MG 24 hr tablet Take 1 tablet (30 mg total) by mouth daily. 90 tablet 1   omega-3 acid ethyl esters (LOVAZA ) 1 g capsule TAKE 2 CAPSULES BY MOUTH 2 TIMES DAILY. 360 capsule 1   omeprazole  (PRILOSEC) 40 MG capsule Take 1 capsule (40 mg total) by mouth daily. 90 capsule 1   ondansetron  (ZOFRAN ) 4 MG tablet Take 1 tablet (4 mg total) by mouth every 8 (eight) hours as needed for nausea or vomiting. 90 tablet 1   rivaroxaban  (XARELTO ) 20 MG TABS tablet Take 1 tablet (20 mg total) by mouth daily with supper. 90 tablet 1   simvastatin  (ZOCOR ) 40 MG tablet TAKE 1 TABLET BY MOUTH EVERY DAY 90 tablet 1   cyanocobalamin  (,VITAMIN B-12,) 1000 MCG/ML injection Inject into the muscle. (Patient not taking: Reported on 05/28/2024)     No current facility-administered medications on file prior to visit.   Past Medical History:  Diagnosis Date   Anemia    Constipation    DVT of lower extremity (deep venous thrombosis) (HCC)    Frequent headaches    Heart murmur    found during pregnancy -  no problems   Hypertension    Muscle pain    Palpitations    Septic thrombophlebitis 08/31/2017   Sinus problem    Stroke [facility])    Past Surgical History:  Procedure Laterality Date   ABDOMINAL EXPLORATION SURGERY  05/2017   LOA and release of SBO, repair of small bowel perforation.   AMPUTATION Left 07/05/2017   Procedure: LEFT ABOVE KNEE AMPUTATION;  Surgeon: Laurence Redell CROME, MD;  Location: [facility] OR;  Service: Vascular;  Laterality: Left;   CATARACT EXTRACTION W/PHACO Right 06/12/2018   Procedure: CATARACT EXTRACTION WITH INTRAOCULAR LENS IMPLANT;  Surgeon: Tobie Baptist, MD;  Location: [facility] OR;  Service: Ophthalmology;  Laterality: Right;   COLONOSCOPY  11/26/2015   Dr Larene - follow-up in 5 years    EMBOLECTOMY Bilateral 06/27/2017   Procedure: BILATERAL FEMORAL POPLITEAL EMBOLECTOMY;  Surgeon: Serene Gaile ORN, MD;  Location: [facility] OR;  Service: Vascular;  Laterality: Bilateral;   FASCIOTOMY Left 06/27/2017   Procedure: FULL COMPARTMENT LEFT LOWER LEG FASCIOTOMY;  Surgeon: Serene Gaile ORN, MD;  Location: [facility] OR;  Service: Vascular;  Laterality: Left;   PARTIAL HYSTERECTOMY     PATCH ANGIOPLASTY Left 06/27/2017   Procedure: PATCH ANGIOPLASTY Left Posterior Tibial Artery;  Surgeon: Serene Gaile ORN, MD;  Location: [facility] OR;  Service: Vascular;  Laterality: Left;   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 07/11/2017   Procedure: REPAIR OF COMPLEX TRACTION RETINAL DETACHMENT WITH  ENDO LASER AND ANTIFUNGAL INJECTION;  Surgeon: Tobie Baptist, MD;  Location: [facility] OR;  Service: Ophthalmology;  Laterality: Right;   TEE WITHOUT CARDIOVERSION N/A 07/11/2017   Procedure: TRANSESOPHAGEAL ECHOCARDIOGRAM (TEE);  Surgeon: Alveta Aleene PARAS, MD;  Location: [facility] ENDOSCOPY;  Service: Cardiovascular;  Laterality: N/A;    Family History  Problem Relation Age of Onset   Breast cancer Mother    Prostate cancer Father    Prostate cancer Brother    Social History   Socioeconomic History   Marital status: Widowed    Spouse name: Not on file   Number of children: Not on file   Years of education: Not on file   Highest education level: Not on file  Occupational History   Not on file  Tobacco Use   Smoking status: Some Days    Current packs/day: 0.25    Average packs/day: 0.3 packs/day for 53.0 years (13.3 ttl pk-yrs)    Types: Cigarettes   Smokeless tobacco: Never  Vaping Use   Vaping status: Never Used  Substance and Sexual Activity   Alcohol use: No   Drug use: No   Sexual activity: Not Currently  Other Topics Concern   Not on file  Social History Narrative   Lives with her daughter   Social Drivers of Health   Financial Resource Strain: Low Risk  (07/31/2023)   Overall Financial  Resource Strain (CARDIA)    Difficulty of Paying Living Expenses: Not hard at all  Food Insecurity: No Food Insecurity (07/31/2023)   Hunger Vital Sign    Worried About Running Out of Food in the Last Year: Never true    Ran Out of Food in the Last Year: Never true  Transportation Needs: No Transportation Needs (01/11/2023)   PRAPARE - Administrator, Civil Service (Medical): No    Lack of Transportation (Non-Medical): No  Physical Activity: Insufficiently Active (07/31/2023)   Exercise Vital Sign    Days of Exercise per Week: 1 day    Minutes of Exercise per Session: 60 min  Stress: No Stress Concern Present (07/31/2023)   [employer] of Occupational Health - Occupational Stress Questionnaire    Feeling of Stress : Not at all  Social Connections: Moderately Isolated (07/31/2023)   Social Connection and Isolation Panel    Frequency of Communication with Friends and Family: More than three times a week    Frequency of Social Gatherings with Friends and Family: More than three times a week    Attends Religious Services: 1 to 4 times per year    Active Member of [employer] or Organizations: No    Attends Banker Meetings: Never    Marital Status: Widowed    Objective:  BP 124/62   Pulse 69   Temp 98.5 F (36.9 C) (Temporal)   Resp 18   Ht 5' 2.5 (1.588 m)   Wt 184 lb (83.5 kg)   SpO2 99%   BMI 33.12 kg/m      05/28/2024    9:50 AM 02/26/2024   10:14 AM 02/26/2024    9:40 AM  BP/Weight  Systolic BP 124 140 148  Diastolic BP 62 72 72  Wt. (Lbs) 184  184  BMI 33.12 kg/m2  33.12 kg/m2    Physical Exam Vitals reviewed.  Constitutional:      General: She is not in acute distress.    Appearance: Normal appearance. She is obese. She is not ill-appearing.  HENT:     Right Ear:  Tympanic membrane normal.     Left Ear: Tympanic membrane normal.     Nose: No rhinorrhea.     Right Sinus: Maxillary sinus tenderness and frontal sinus tenderness present.      Left Sinus: Maxillary sinus tenderness and frontal sinus tenderness present.     Mouth/Throat:     Pharynx: No posterior oropharyngeal erythema.  Eyes:     Conjunctiva/sclera: Conjunctivae normal.  Neck:     Vascular: No carotid bruit.  Cardiovascular:     Rate and Rhythm: Normal rate and regular rhythm.     Heart sounds: Normal heart sounds. No murmur heard. Pulmonary:     Effort: Pulmonary effort is normal.     Breath sounds: Normal breath sounds. No wheezing.  Abdominal:     General: Bowel sounds are normal.     Palpations: Abdomen is soft.  Skin:    General: Skin is warm.  Neurological:     Mental Status: She is alert. Mental status is at baseline.  Psychiatric:        Mood and Affect: Mood normal.        Behavior: Behavior normal.     Lab Results  Component Value Date   WBC 6.8 02/26/2024   HGB 11.2 02/26/2024   HCT 35.7 02/26/2024   PLT 459 (H) 02/26/2024   GLUCOSE 96 02/26/2024   CHOL 149 02/26/2024   TRIG 165 (H) 02/26/2024   HDL 54 02/26/2024   LDLCALC 67 02/26/2024   ALT 16 02/26/2024   AST 18 02/26/2024   NA 141 02/26/2024   K 5.1 02/26/2024   CL 102 02/26/2024   CREATININE 0.85 02/26/2024   BUN 12 02/26/2024   CO2 23 02/26/2024   TSH 2.450 02/26/2024   INR 1.3 (H) 12/08/2022   HGBA1C 5.9 (H) 02/26/2024      Assessment & Plan:  Other hyperlipidemia Assessment & Plan: Well controlled.  Lab Results  Component Value Date   LDLCALC 67 02/26/2024   No changes to medicines.  Continue simvastatin  and Zetia. Continue to work on eating a healthy diet and exercise.  Labs drawn today.    Orders: -     Lipid panel -     CMP14+EGFR  Diabetes due to underlying condition w oth circulatory comp [facility]) Assessment & Plan: Hemoglobin A1c at 5.9%. Monitoring and lifestyle modifications needed. - Order repeat hemoglobin A1c.  Orders: -     Hemoglobin A1c  Migraine without aura and without status migrainosus, not intractable Assessment &  Plan: Migraine without aura Chronic migraines managed with Fioricet, risk of rebound headaches due to caffeine . Ubrelvy  not yet trialed. She has tried Nurtec and did not like the side effects. - Provide sample of Ubrelvy  for trial.  - Administer Toradol  injection for acute migraine relief. - Ensure Fioricet prescription is refilled.  Orders: -     Butalbital -APAP-Caffeine ; Take 1 tablet by mouth every 6 (six) hours as needed for migraine.  Dispense: 30 tablet; Refill: 2 -     Ketorolac  Tromethamine   Thrombocytosis Assessment & Plan: Thrombocytosis Elevated platelet count likely secondary to nicotine use. - labs drawn, Await labs/testing for assessment and recommendations   Orders: -     CBC with Differential/Platelet  Unilateral AKA, left (HCC) Assessment & Plan: Stable.  Orders: -     AMB Referral VBCI Care Management  Hemiplegia and hemiparesis following cerebral infarction affecting left non-dominant side [facility]) Assessment & Plan: Condition stable - Referrals for home health have not been successfully achieved.  -  Patient needs help with transportation and help with making her bed. - New referral sent requesting help - Care management for SDOH referral created.        Meds ordered this encounter  Medications   butalbital -acetaminophen -caffeine  (FIORICET) 50-325-40 MG tablet    Sig: Take 1 tablet by mouth every 6 (six) hours as needed for migraine.    Dispense:  30 tablet    Refill:  2   DISCONTD: Ubrogepant  (UBRELVY ) 50 MG TABS    Sig: Take 1 tablet (50 mg total) by mouth daily as needed.    Dispense:  14 tablet    Refill:  0   ketorolac  (TORADOL ) injection 60 mg    Orders Placed This Encounter  Procedures   Hemoglobin A1c   Lipid panel   CBC with Differential/Platelet   CMP14+EGFR   AMB Referral VBCI Care Management     Follow-up: Return in about 3 months (around 09/02/2024) for chronic, fasting, lab visit.   I,Angela Taylor,acting as a Neurosurgeon for  Harrie CHRISTELLA Cedar, FNP.,have documented all relevant documentation on the behalf of Harrie CHRISTELLA Cedar, FNP,as directed by  Harrie CHRISTELLA Cedar, FNP while in the presence of Harrie CHRISTELLA Cedar, FNP.   An After Visit Summary was printed and given to the patient.  I attest that I have reviewed this visit and agree with the plan scribed by my staff.   Harrie CHRISTELLA Cedar, FNP [facility] [phone]

## 2024-05-28 NOTE — Assessment & Plan Note (Signed)
 Stable

## 2024-05-29 LAB — CBC WITH DIFFERENTIAL/PLATELET
Basophils Absolute: 0 x10E3/uL (ref 0.0–0.2)
Basos: 1 %
EOS (ABSOLUTE): 0.3 x10E3/uL (ref 0.0–0.4)
Eos: 5 %
Hematocrit: 34.1 % (ref 34.0–46.6)
Hemoglobin: 10.6 g/dL — ABNORMAL LOW (ref 11.1–15.9)
Immature Grans (Abs): 0 x10E3/uL (ref 0.0–0.1)
Immature Granulocytes: 0 %
Lymphocytes Absolute: 2.1 x10E3/uL (ref 0.7–3.1)
Lymphs: 30 %
MCH: 25.3 pg — ABNORMAL LOW (ref 26.6–33.0)
MCHC: 31.1 g/dL — ABNORMAL LOW (ref 31.5–35.7)
MCV: 81 fL (ref 79–97)
Monocytes Absolute: 0.7 x10E3/uL (ref 0.1–0.9)
Monocytes: 10 %
Neutrophils Absolute: 3.9 x10E3/uL (ref 1.4–7.0)
Neutrophils: 54 %
Platelets: 450 x10E3/uL (ref 150–450)
RBC: 4.19 x10E6/uL (ref 3.77–5.28)
RDW: 16.1 % — ABNORMAL HIGH (ref 11.7–15.4)
WBC: 7 x10E3/uL (ref 3.4–10.8)

## 2024-05-29 LAB — CMP14+EGFR
ALT: 14 IU/L (ref 0–32)
AST: 18 IU/L (ref 0–40)
Albumin: 4.3 g/dL (ref 3.8–4.8)
Alkaline Phosphatase: 77 IU/L (ref 44–121)
BUN/Creatinine Ratio: 14 (ref 12–28)
BUN: 12 mg/dL (ref 8–27)
Bilirubin Total: 0.2 mg/dL (ref 0.0–1.2)
CO2: 18 mmol/L — ABNORMAL LOW (ref 20–29)
Calcium: 9.6 mg/dL (ref 8.7–10.3)
Chloride: 102 mmol/L (ref 96–106)
Creatinine, Ser: 0.88 mg/dL (ref 0.57–1.00)
Globulin, Total: 2.6 g/dL (ref 1.5–4.5)
Glucose: 86 mg/dL (ref 70–99)
Potassium: 5.1 mmol/L (ref 3.5–5.2)
Sodium: 136 mmol/L (ref 134–144)
Total Protein: 6.9 g/dL (ref 6.0–8.5)
eGFR: 68 mL/min/1.73 (ref >59)

## 2024-05-29 LAB — LIPID PANEL
Chol/HDL Ratio: 3.1 ratio (ref 0.0–4.4)
Cholesterol, Total: 155 mg/dL (ref 100–199)
HDL: 50 mg/dL (ref >39)
LDL Chol Calc (NIH): 79 mg/dL (ref 0–99)
Triglycerides: 153 mg/dL — ABNORMAL HIGH (ref 0–149)
VLDL Cholesterol Cal: 26 mg/dL (ref 5–40)

## 2024-05-29 LAB — HEMOGLOBIN A1C
Est. average glucose Bld gHb Est-mCnc: 126 mg/dL
Hgb A1c MFr Bld: 6 % — ABNORMAL HIGH (ref 4.8–5.6)

## 2024-05-29 NOTE — Addendum Note (Signed)
 Addended by: TERESSA HARRIE HERO on: 05/29/2024 12:18 PM   Modules accepted: Orders

## 2024-05-30 ENCOUNTER — Ambulatory Visit: Payer: Self-pay | Admitting: Family Medicine

## 2024-05-30 ENCOUNTER — Ambulatory Visit: Admitting: Family Medicine

## 2024-06-04 ENCOUNTER — Telehealth: Payer: Self-pay

## 2024-06-04 NOTE — Progress Notes (Signed)
 Complex Care Management Note Care Guide Note  06/04/2024 Name: Anna Hayes MRN: [id number] DOB: 23-May-1951   Complex Care Management Outreach Attempts: An unsuccessful telephone outreach was attempted today to offer the patient information about available complex care management services.  Follow Up Plan:  Additional outreach attempts will be made to offer the patient complex care management information and services.   Encounter Outcome:  No Answer  Dreama Lynwood [facility]  [facility], [facility] VBCI Assistant Direct Dial: [phone]  Fax: [phone]

## 2024-06-07 NOTE — Progress Notes (Signed)
 Complex Care Management Note Care Guide Note  06/07/2024 Name: Anna Hayes MRN: [id number] DOB: 10-15-1950   Complex Care Management Outreach Attempts: A second unsuccessful outreach was attempted today to offer the patient with information about available complex care management services.  Follow Up Plan:  Additional outreach attempts will be made to offer the patient complex care management information and services.   Encounter Outcome:  Patient Request to Call Back  Dreama Lynwood [facility]  [facility], [employer] VBCI Assistant Direct Dial: [phone]  Fax: [phone]

## 2024-06-10 ENCOUNTER — Ambulatory Visit: Admitting: Podiatry

## 2024-06-10 NOTE — Progress Notes (Signed)
 Complex Care Management Note  Care Guide Note 06/10/2024 Name: Anna Hayes MRN: [id number] DOB: 09/13/1951  Anna Hayes is a 73 y.o. year old female who sees Teressa Harrie HERO, FNP for primary care. I reached out to Roselind Brien Pouch by phone today to offer complex care management services.  Anna Hayes was given information about Complex Care Management services today including:   The Complex Care Management services include support from the care team which includes your Nurse Care Manager, Clinical Social Worker, or Pharmacist.  The Complex Care Management team is here to help remove barriers to the health concerns and goals most important to you. Complex Care Management services are voluntary, and the patient may decline or stop services at any time by request to their care team member.   Complex Care Management Consent Status: Patient agreed to services and verbal consent obtained.   Follow up plan:  Telephone appointment with complex care management team member scheduled for:  06/12/24 & 06/20/24.   Encounter Outcome:  Patient Scheduled  Dreama Lynwood [facility]  [facility], [facility] VBCI Assistant Direct Dial: [phone]  Fax: [phone]

## 2024-06-12 ENCOUNTER — Telehealth: Payer: Self-pay | Admitting: Licensed Clinical Social Worker

## 2024-06-12 ENCOUNTER — Encounter: Payer: Self-pay | Admitting: Licensed Clinical Social Worker

## 2024-06-17 ENCOUNTER — Other Ambulatory Visit: Payer: Self-pay | Admitting: Licensed Clinical Social Worker

## 2024-06-17 NOTE — Patient Instructions (Signed)
 Visit Information  Thank you for taking time to visit with me today. Please don't hesitate to contact me if I can be of assistance to you before our next scheduled appointment.  Our next appointment is by telephone on 07/01/2024 at 1:00 pm Please call the care guide team at [phone] if you need to cancel or reschedule your appointment.   Following is a copy of your care plan:   Goals Addressed             This Visit's Progress    BSW VBCI Social Work Care Plan       Problems:   Food Insecurity  and does not have any food issues but would like to apply for food stamps  CSW Clinical Goal(s):   Over the next 2 weeks the Patient will will follow up with completing the FNS application online or paper copy as directed by Social Work.  Interventions:  SW will mail the paper copy and also provide the link to the e-pass to complete the food stamp application SW will also make a referral to [facility].  Patient Goals/Self-Care Activities:  Patient will complete the FNS application    Plan:   Telephone follow up appointment with care management team member scheduled for:  07/01/2024 at 1:00 pm        Please call the Suicide and Crisis Lifeline: 988 go to [facility] Urgent [facility] [address] [place] [phone]) call 911 if you are experiencing a Mental Health or Behavioral Health Crisis or need someone to talk to.  The patient verbalized understanding of instructions, educational materials, and care plan provided today and DECLINED offer to receive copy of patient instructions, educational materials, and care plan.   Tobias CHARM [name] HEDWIG, PhD [facility], [facility] Social Worker Direct Dial: [phone]  Fax: [phone]

## 2024-06-17 NOTE — Patient Outreach (Addendum)
 Complex Care Management   Visit Note  06/17/2024  Name:  Anna Hayes MRN: [id number] DOB: 1950/10/14  Situation: Referral received for Complex Care Management related to SDOH Barriers:  Food insecurity FNS application and CAP/DA services I obtained verbal consent from Patient.  Visit completed with Patient  on the phone  Background:   Past Medical History:  Diagnosis Date   Anemia    Constipation    DVT of lower extremity (deep venous thrombosis) (HCC)    Frequent headaches    Heart murmur    found during pregnancy -  no problems   Hypertension    Muscle pain    Palpitations    Septic thrombophlebitis 08/31/2017   Sinus problem    Stroke [facility])     Assessment: Patient did not have any urgent SDOH needs during the visit , but does want to apply for Food Stamps, the SW will mail a paper application and send th link via email when the patient obtains her daughter email address. Patient was inquiring abut CAP/DA services and SW sent a referral to [facility]. Patient also stated that the PCP did a referral for a HHA but she has not heard anything as of today, SW will message the PCP. Also reminded the patient about her appoint with the [facility] on Thursday 07/20/2024 at 1:00 pm.    Recommendation:   none  Follow Up Plan:   Telephone follow up appointment date/time:  07/01/2024 at 1:00 pm  Tobias CHARM [name] HEDWIG, PhD [facility], [facility] Social Worker Direct Dial: [phone]  Fax: [phone]

## 2024-06-20 ENCOUNTER — Other Ambulatory Visit: Payer: Self-pay

## 2024-06-20 ENCOUNTER — Encounter: Payer: Self-pay | Admitting: Licensed Clinical Social Worker

## 2024-06-20 NOTE — Patient Outreach (Addendum)
 06/20/2024  Patients daughter [name] called in to provide the SW will her email, the SW will email the link to the E-pass for Food Stamps and the daughter will assist the patient with completing the application.  Tobias CHARM [name] HEDWIG, PhD [facility], [facility] Social Worker Direct Dial: [phone]  Fax: [phone]

## 2024-06-20 NOTE — Patient Instructions (Signed)
 Visit Information  Thank you for taking time to visit with me today. Please don't hesitate to contact me if I can be of assistance to you before our next scheduled appointment.  Our next appointment is by telephone on 07/03/24 at 9:30 am Please call the care guide team at [phone] if you need to cancel or reschedule your appointment.   Following is a copy of your care plan:   Goals Addressed             This Visit's Progress    VBCI RN Care Plan       Problems:  Care Coordination needs related to in home personal assistant care resources  Goal: Over the next 45 days the Patient will verbalize receipt of in home personal care assistant resources  as evidenced by patient report and/or review of chart.   Interventions:  Evaluated patient management of health care needs and management of health. Assessed patient care needs Care coordination with BSW and PCP as needed  Patient Self-Care Activities:  Attend all scheduled provider appointments Call provider office for new concerns or questions  Take medications as prescribed    Plan:  Telephone follow up appointment with care management team member scheduled for:  07/03/24 at 9:30 am           Please call the Suicide and Crisis Lifeline: 988 call the [place]  National Suicide Prevention Lifeline: 667-505-0719 or TTY: 563-130-2417 TTY 619 674 8622) to talk to a trained counselor call 1-800-273-TALK (toll free, 24 hour hotline) if you are experiencing a Mental Health or Behavioral Health Crisis or need someone to talk to.  The patient verbalized understanding of instructions, educational materials, and care plan provided today and DECLINED offer to receive copy of patient instructions, educational materials, and care plan.   Heddy Shutter, RN, MSN, BSN, CCM [place]  [facility], Population Health Case Manager Phone: [phone]

## 2024-06-20 NOTE — Patient Outreach (Signed)
 Complex Care Management   Visit Note  06/20/2024  Name:  Anna Hayes MRN: [id number] DOB: [birth year]  Situation: Referral received for Complex Care Management related to personal care assistance resources I obtained verbal consent from Patient.  Visit completed with Patient  on the phone  Background:   Past Medical History:  Diagnosis Date   Anemia    Constipation    DVT of lower extremity (deep venous thrombosis) (HCC)    Frequent headaches    Heart murmur    found during pregnancy -  no problems   Hypertension    Muscle pain    Palpitations    Septic thrombophlebitis 08/31/2017   Sinus problem    Stroke [facility])     Assessment: Patient requesting personal care assistance resources.  Patient reports she prepares meals for herself. She used [employer] transportation if needed. But would like someone to assist with things like making the bed and doing things around the house and take her to appointments if her daughter is unable to. Patient denies any health concerns. No complex case management needs identified at this time.   Patient Reported Symptoms:  Cognitive Cognitive Status: No symptoms reported      Neurological Neurological Review of Symptoms: No symptoms reported    HEENT HEENT Symptoms Reported: Other: HEENT Comment: patient states she has seasonal allergies. She reports she takes OTC medication that works good    Cardiovascular Cardiovascular Symptoms Reported: No symptoms reported    Respiratory Respiratory Symptoms Reported: No symptoms reported    Endocrine Endocrine Symptoms Reported: No symptoms reported Is patient diabetic?: No    Gastrointestinal Gastrointestinal Symptoms Reported: No symptoms reported      Genitourinary Genitourinary Symptoms Reported: No symptoms reported    Integumentary Integumentary Symptoms Reported: No symptoms reported    Musculoskeletal Musculoskelatal Symptoms Reviewed: Other Other Musculoskeletal Symptoms: L AKA get  around wheelchari or hoverround. and has prosthesis that she uses sometimes. Patient denies any problems at this time. Musculoskeletal Management Strategies: Medical device, Routine screening Musculoskeletal Self-Management Outcome: 5 (very good) Falls in the past year?: No    Psychosocial Psychosocial Symptoms Reported: No symptoms reported     Quality of Family Relationships: supportive, helpful, involved Do you feel physically threatened by others?: No    06/20/2024    PHQ2-9 Depression Screening   Little interest or pleasure in doing things Not at all  Feeling down, depressed, or hopeless Not at all  PHQ-2 - Total Score 0  Trouble falling or staying asleep, or sleeping too much    Feeling tired or having little energy    Poor appetite or overeating     Feeling bad about yourself - or that you are a failure or have let yourself or your family down    Trouble concentrating on things, such as reading the newspaper or watching television    Moving or speaking so slowly that other people could have noticed.  Or the opposite - being so fidgety or restless that you have been moving around a lot more than usual    Thoughts that you would be better off dead, or hurting yourself in some way    PHQ2-9 Total Score    If you checked off any problems, how difficult have these problems made it for you to do your work, take care of things at home, or get along with other people    Depression Interventions/Treatment      There were no vitals filed for this visit.  Medications Reviewed Today     Reviewed by Raelle Chambers M, RN (Registered Nurse) on 06/20/24 at 1320  Med List Status: <None>   Medication Order Taking? Sig Documenting Provider Last Dose Status Informant  atenolol  (TENORMIN ) 50 MG tablet [id number] Yes TAKE 1 TABLET BY MOUTH EVERY DAY Cox, Kirsten, MD  Active   butalbital -acetaminophen -caffeine  (FIORICET) 50-325-40 MG tablet [id number] Yes Take 1 tablet by mouth every 6 (six) hours  as needed for migraine. Teressa Harrie HERO, FNP  Active   cyanocobalamin  (,VITAMIN B-12,) 1000 MCG/ML injection [id number]  Inject into the muscle.  Patient not taking: Reported on 06/20/2024   [provider]  Active   fluticasone  (FLONASE ) 50 MCG/ACT nasal spray [id number] Yes SPRAY 2 SPRAYS INTO EACH NOSTRIL EVERY DAY Cox, Kirsten, MD  Active   furosemide (LASIX) 20 MG tablet [id number] Yes Take 20 mg by mouth daily as needed for fluid.  [provider]  Active Self           Med Note VIVIANN TILLMAN HERO Stevan Jun 28, 2017 12:15 PM)    levothyroxine  (SYNTHROID ) 50 MCG tablet [id number] Yes TAKE 1 TABLET BY MOUTH EVERY DAY Cox, Kirsten, MD  Active   LINZESS  145 MCG CAPS capsule [id number] Yes TAKE 1 CAPSULE BY MOUTH EVERY DAY BEFORE BREAKFAST Cox, Kirsten, MD  Active   losartan  (COZAAR ) 50 MG tablet [id number] Yes Take 1 tablet (50 mg total) by mouth daily. Cox, Kirsten, MD  Active   NIFEdipine  (PROCARDIA  XL) 30 MG 24 hr tablet [id number] Yes Take 1 tablet (30 mg total) by mouth daily. Cox, Kirsten, MD  Active   omega-3 acid ethyl esters (LOVAZA ) 1 g capsule [id number] Yes TAKE 2 CAPSULES BY MOUTH 2 TIMES DAILY. Cox, Kirsten, MD  Active   omeprazole  (PRILOSEC) 40 MG capsule [id number] Yes Take 1 capsule (40 mg total) by mouth daily. Cox, Kirsten, MD  Active   ondansetron  (ZOFRAN ) 4 MG tablet [id number] Yes Take 1 tablet (4 mg total) by mouth every 8 (eight) hours as needed for nausea or vomiting. Cox, Kirsten, MD  Active   rivaroxaban  (XARELTO ) 20 MG TABS tablet [id number] Yes Take 1 tablet (20 mg total) by mouth daily with supper. Cox, Kirsten, MD  Active   simvastatin  (ZOCOR ) 40 MG tablet [id number] Yes TAKE 1 TABLET BY MOUTH EVERY DAY Cox, Kirsten, MD  Active           Recommendation:   No recommendations at this time  Follow Up Plan:   Telephone follow up appointment date/time:  07/03/24 at 9:30am  Heddy Shutter, RN, MSN, BSN, CCM [place]  [facility], Population  Health Case Manager Phone: [phone]

## 2024-07-01 ENCOUNTER — Other Ambulatory Visit: Payer: Self-pay | Admitting: Licensed Clinical Social Worker

## 2024-07-01 ENCOUNTER — Ambulatory Visit: Admitting: Podiatry

## 2024-07-01 NOTE — Patient Outreach (Signed)
 Complex Care Management   Visit Note  07/01/2024  Name:  Anna Hayes MRN: [id number] DOB: Dec 22, 1950  Situation: Referral received for Complex Care Management related to SDOH Barriers:  Food insecurity Food stamp application and [facility] referral  I obtained verbal consent from Patient.  Visit completed with Patient  on the phone  Background:   Past Medical History:  Diagnosis Date   Anemia    Constipation    DVT of lower extremity (deep venous thrombosis) (HCC)    Frequent headaches    Heart murmur    found during pregnancy -  no problems   Hypertension    Muscle pain    Palpitations    Septic thrombophlebitis 08/31/2017   Sinus problem    Stroke [facility])     Assessment:Patient stated that her daughter completed the E-pass fir FNS (food stamp) benefits and the application is pending and she is still waiting fo [facility].    SDOH Interventions    Flowsheet Row Patient Outreach Telephone from 06/20/2024 in [place] POPULATION HEALTH DEPARTMENT Patient Outreach Telephone from 06/17/2024 in [place] POPULATION HEALTH DEPARTMENT Office Visit from 07/31/2023 in [facility] Care Coordination from 01/11/2023 in [facility] Office Visit from 06/29/2022 in [facility] Chronic Care Management from 04/13/2022 in [facility]  SDOH Interventions        Food Insecurity Interventions Intervention Not Indicated Intervention Not Indicated Intervention Not Indicated -- -- --  Housing Interventions Intervention Not Indicated Intervention Not Indicated Intervention Not Indicated -- -- --  Transportation Interventions Intervention Not Indicated  [[employer] transportation] Intervention Not Indicated  [[employer] uses this transportation services] -- Intervention Not Indicated -- Intervention Not Indicated  Utilities Interventions Intervention Not Indicated Intervention Not Indicated Intervention Not Indicated -- Intervention Not  Indicated --  Financial Strain Interventions -- -- Intervention Not Indicated Intervention Not Indicated -- Intervention Not Indicated  Physical Activity Interventions -- -- Intervention Not Indicated, [employer] -- Intervention Not Indicated --  Stress Interventions -- -- Intervention Not Indicated -- Intervention Not Indicated --  Social Connections Interventions -- -- Intervention Not Indicated -- Intervention Not Indicated --      Recommendation:   none  Follow Up Plan:   Telephone follow up appointment date/time:  07/15/2024 at 1:00 pm  Tobias CHARM [name] HEDWIG, PhD [facility], [facility] Social Worker Direct Dial: [phone]  Fax: [phone]

## 2024-07-01 NOTE — Patient Instructions (Signed)
 Visit Information  Thank you for taking time to visit with me today. Please don't hesitate to contact me if I can be of assistance to you before our next scheduled appointment.  Your next care management appointment is by telephone on 07/15/2024 at 1:00 pm    Please call the care guide team at [phone] if you need to cancel, schedule, or reschedule an appointment.   Please call the Suicide and Crisis Lifeline: 988 go to [name] [address] [place] [phone]) call 911 if you are experiencing a Mental Health or Behavioral Health Crisis or need someone to talk to.  Anna Hayes [name] HEDWIG, PhD [facility], [facility] Social Worker Direct Dial: [phone]  Fax: [phone]

## 2024-07-03 ENCOUNTER — Other Ambulatory Visit: Payer: Self-pay

## 2024-07-03 NOTE — Patient Instructions (Signed)
 Visit Information  Thank you for taking time to visit with me today. Please don't hesitate to contact me if I can be of assistance to you before our next scheduled appointment.  Your next care management appointment is by telephone on 08/02/24 at 1:00 pm  Please call the care guide team at [phone] if you need to cancel, schedule, or reschedule an appointment.   Please call the Suicide and Crisis Lifeline: 988 call the [place]  National Suicide Prevention Lifeline: 437-204-9375 or TTY: (936)304-7593 TTY 618-175-3287) to talk to a trained counselor if you are experiencing a Mental Health or Behavioral Health Crisis or need someone to talk to.  Heddy Shutter, RN, MSN, BSN, CCM [place]  [facility], Population Health Case Manager Phone: [phone]

## 2024-07-03 NOTE — Patient Outreach (Signed)
 Complex Care Management   Visit Note  07/03/2024  Name:  Anna Hayes MRN: [id number] DOB: 10-02-1950  Situation: Referral received for Complex Care Management related to Personal Care Assistance resources. I obtained verbal consent from Patient.  Visit completed with Patient  on the phone  Background:   Past Medical History:  Diagnosis Date   Anemia    Constipation    DVT of lower extremity (deep venous thrombosis) (HCC)    Frequent headaches    Heart murmur    found during pregnancy -  no problems   Hypertension    Muscle pain    Palpitations    Septic thrombophlebitis 08/31/2017   Sinus problem    Stroke [facility])     Assessment: patient reports no questions or concerns at this time. She continues to work with BSW for in home personal care needs/resources. Patient states her daughter is also assisting as needed. Patient Reported Symptoms:  Cognitive Cognitive Status: No symptoms reported      Neurological Neurological Review of Symptoms: No symptoms reported    HEENT HEENT Symptoms Reported:  (patient reports nasal congestion, sinus problems. using OTC medications that works pretty good)      Cardiovascular Cardiovascular Symptoms Reported: No symptoms reported    Respiratory Respiratory Symptoms Reported: No symptoms reported    Endocrine Endocrine Symptoms Reported: No symptoms reported    Gastrointestinal Gastrointestinal Symptoms Reported: No symptoms reported      Genitourinary Genitourinary Symptoms Reported: No symptoms reported    Integumentary Integumentary Symptoms Reported: No symptoms reported    Musculoskeletal Musculoskelatal Symptoms Reviewed: No symptoms reported Other Musculoskeletal Symptoms: patient continues to use hoverround   Falls in the past year?: No    Psychosocial Psychosocial Symptoms Reported: No symptoms reported          There were no vitals filed for this visit.  Medications Reviewed Today     Reviewed by Maraki Macquarrie,  Savanah Bayles M, RN (Registered Nurse) on 07/03/24 at 1004  Med List Status: <None>   Medication Order Taking? Sig Documenting Provider Last Dose Status Informant  atenolol  (TENORMIN ) 50 MG tablet [id number] Yes TAKE 1 TABLET BY MOUTH EVERY DAY Cox, Kirsten, MD  Active   butalbital -acetaminophen -caffeine  (FIORICET) 50-325-40 MG tablet [id number] Yes Take 1 tablet by mouth every 6 (six) hours as needed for migraine. Teressa Harrie HERO, FNP  Active   cyanocobalamin  (,VITAMIN B-12,) 1000 MCG/ML injection [id number]  Inject into the muscle.  Patient not taking: Reported on 07/03/2024   [provider]  Active   fluticasone  (FLONASE ) 50 MCG/ACT nasal spray [id number] Yes SPRAY 2 SPRAYS INTO EACH NOSTRIL EVERY DAY Cox, Kirsten, MD  Active   furosemide (LASIX) 20 MG tablet [id number] Yes Take 20 mg by mouth daily as needed for fluid.  [provider]  Active Self           Med Note VIVIANN TILLMAN HERO Stevan Jun 28, 2017 12:15 PM)    levothyroxine  (SYNTHROID ) 50 MCG tablet [id number] Yes TAKE 1 TABLET BY MOUTH EVERY DAY Cox, Kirsten, MD  Active   LINZESS  145 MCG CAPS capsule [id number] Yes TAKE 1 CAPSULE BY MOUTH EVERY DAY BEFORE BREAKFAST Cox, Kirsten, MD  Active   losartan  (COZAAR ) 50 MG tablet [id number] Yes Take 1 tablet (50 mg total) by mouth daily. Cox, Kirsten, MD  Active   NIFEdipine  (PROCARDIA  XL) 30 MG 24 hr tablet [id number] Yes Take 1 tablet (30 mg total) by mouth daily. Sherre Clapper, MD  Active  omega-3 acid ethyl esters (LOVAZA ) 1 g capsule [id number] Yes TAKE 2 CAPSULES BY MOUTH 2 TIMES DAILY. Cox, Kirsten, MD  Active   omeprazole  (PRILOSEC) 40 MG capsule [id number] Yes Take 1 capsule (40 mg total) by mouth daily. Cox, Kirsten, MD  Active   ondansetron  (ZOFRAN ) 4 MG tablet [id number] Yes Take 1 tablet (4 mg total) by mouth every 8 (eight) hours as needed for nausea or vomiting. Cox, Kirsten, MD  Active   rivaroxaban  (XARELTO ) 20 MG TABS tablet [id number] Yes Take 1 tablet (20 mg total) by mouth daily  with supper. Cox, Kirsten, MD  Active   simvastatin  (ZOCOR ) 40 MG tablet [id number] Yes TAKE 1 TABLET BY MOUTH EVERY DAY Cox, Kirsten, MD  Active           Recommendation:   Continue Current Plan of Care  Follow Up Plan:  BSW scheduled to follow up on 07/15/24 at 1:00 pm Telephone follow up appointment date/time:  08/02/24 at 1:00 pm  Heddy Shutter, RN, MSN, BSN, CCM [place]  [facility], Population Health Case Manager Phone: [phone]

## 2024-07-04 ENCOUNTER — Encounter: Payer: Self-pay | Admitting: Oncology

## 2024-07-04 ENCOUNTER — Encounter: Payer: Self-pay | Admitting: Podiatry

## 2024-07-04 ENCOUNTER — Ambulatory Visit: Admitting: Podiatry

## 2024-07-04 DIAGNOSIS — Z89612 Acquired absence of left leg above knee: Secondary | ICD-10-CM

## 2024-07-04 DIAGNOSIS — M79674 Pain in right toe(s): Secondary | ICD-10-CM | POA: Diagnosis not present

## 2024-07-04 DIAGNOSIS — B351 Tinea unguium: Secondary | ICD-10-CM | POA: Diagnosis not present

## 2024-07-04 DIAGNOSIS — Z7901 Long term (current) use of anticoagulants: Secondary | ICD-10-CM

## 2024-07-04 NOTE — Progress Notes (Signed)
  Subjective:  Patient ID: Anna Hayes, female    DOB: 04-12-1951,  MRN: [id number]  Chief Complaint  Patient presents with   RFC    RFC of the right foot. Hallux and toe 2 are tring to ingrow.  Not diabetic No anti coag.      73 y.o. female presents with the above complaint. History confirmed with patient. Patient presenting with pain related to dystrophic thickened elongated nails. Patient is unable to trim own nails related to nail dystrophy and/or mobility issues.  She comes in for at risk footcare due to prior left above-knee amputation from blood clot.  The right 1st and 2nd toenails are especially dystrophic and painful.  Objective:  Physical Exam: warm to cool, good capillary refill, diminished pedal hair growth nail exam onychomycosis of the toenails, onycholysis, dystrophic nails, and right 1st and 2nd toenail especially tender with dystrophic changes and some incurvation to the nails. DP pulse faintly palpable, DP pulse faintly palpable right side and protective sensation intact Left lower extremity: History of above-knee amputation Right Foot: Pain with palpation of nails due to elongation and dystrophic growth.  No symptomatic limitations in pedal range of motion.  Assessment:   1. Pain due to onychomycosis of toenail of right foot   2. Chronic anticoagulation   3. History of above knee amputation, left (HCC)      Plan:  Patient was evaluated and treated and all questions answered.  #Onychomycosis with pain  -Nails palliatively debrided as below. -Educated on self-care - On chronic Xarelto  - Slant back nail trim of the painful right 1st and 2nd toenails today. - Monitor for signs of infection - Recommend bandaging with antibiotic ointment as a precaution over the next 5 to 7 days.  Did apply this as well today though no active drainage bleeding noted. - Also recommend that she apply Vicks vapor rub to the toenails in effort to help keep them soft - At risk  footcare due to chronic anticoagulation and prior left above-knee amputation  Procedure: Nail Debridement Rationale: Pain Type of Debridement: manual, sharp debridement. Instrumentation: Nail nipper, rotary burr. Number of Nails: 5    Return in about 9 weeks (around 09/05/2024) for Routine Foot Care.         [name] Saddler, DPM [facility]

## 2024-07-15 ENCOUNTER — Other Ambulatory Visit: Payer: Self-pay | Admitting: Licensed Clinical Social Worker

## 2024-07-15 NOTE — Patient Instructions (Signed)
 Visit Information  Thank you for taking time to visit with me today. Please don't hesitate to contact me if I can be of assistance to you before our next scheduled appointment.  Your next care management appointment is by telephone on 07-29-2024 at 2:30 pm    Please call the care guide team at [phone] if you need to cancel, schedule, or reschedule an appointment.   Please call the Suicide and Crisis Lifeline: 988 go to [facility] Urgent [facility] [address], [place] [phone]) call 911 if you are experiencing a Mental Health or Behavioral Health Crisis or need someone to talk to.  Tobias CHARM [name] HEDWIG, PhD [facility], [facility] Social Worker Direct Dial: [phone]  Fax: [phone]

## 2024-07-15 NOTE — Patient Outreach (Signed)
 Complex Care Management   Visit Note  07/15/2024  Name:  Anna Hayes MRN: [id number] DOB: [birth year]  Situation: Referral received for Complex Care Management related to SDOH Barriers:  Food insecurity [facility] referral  I obtained verbal consent from Patient.  Visit completed with Patient  on the phone  Background:   Past Medical History:  Diagnosis Date   Anemia    Constipation    DVT of lower extremity (deep venous thrombosis) (HCC)    Frequent headaches    Heart murmur    found during pregnancy -  no problems   Hypertension    Muscle pain    Palpitations    Septic thrombophlebitis 08/31/2017   Sinus problem    Stroke [facility])     Assessment: Patient is still awaiting on the Pending FNS application and has not heard form [facility] SW provided the number to call and patient and daughter will call to get an update, if the patient still needs assistance with the issue the SW will assist on the next follow up.    SDOH Interventions    Flowsheet Row Patient Outreach Telephone from 06/20/2024 in [place] [facility] Patient Outreach Telephone from 06/17/2024 in [place] [facility] Office Visit from 07/31/2023 in [facility] Care Coordination from 01/11/2023 in [facility] Office Visit from 06/29/2022 in [facility] Chronic Care Management from 04/13/2022 in [facility]  SDOH Interventions        Food Insecurity Interventions Intervention Not Indicated Intervention Not Indicated Intervention Not Indicated -- -- --  Housing Interventions Intervention Not Indicated Intervention Not Indicated Intervention Not Indicated -- -- --  Transportation Interventions Intervention Not Indicated  [RCAT transportation] Intervention Not Indicated  [RCAT uses this transportation services] -- Intervention Not Indicated -- Intervention Not Indicated  Utilities Interventions  Intervention Not Indicated Intervention Not Indicated Intervention Not Indicated -- Intervention Not Indicated --  Financial Strain Interventions -- -- Intervention Not Indicated Intervention Not Indicated -- Intervention Not Indicated  Physical Activity Interventions -- -- Intervention Not Indicated, [employer] -- Intervention Not Indicated --  Stress Interventions -- -- Intervention Not Indicated -- Intervention Not Indicated --  Social Connections Interventions -- -- Intervention Not Indicated -- Intervention Not Indicated --      Recommendation:   none  Follow Up Plan:   Telephone follow up appointment date/time:  07/29/2024 @ 2:30 pm  Tobias CHARM [name] HEDWIG, PhD [facility], [facility] Social Worker Direct Dial: [phone]  Fax: [phone]

## 2024-07-21 ENCOUNTER — Other Ambulatory Visit: Payer: Self-pay | Admitting: Family Medicine

## 2024-07-21 DIAGNOSIS — E038 Other specified hypothyroidism: Secondary | ICD-10-CM

## 2024-07-23 ENCOUNTER — Other Ambulatory Visit: Payer: Self-pay | Admitting: Family Medicine

## 2024-07-23 DIAGNOSIS — E782 Mixed hyperlipidemia: Secondary | ICD-10-CM

## 2024-07-23 DIAGNOSIS — R110 Nausea: Secondary | ICD-10-CM

## 2024-07-29 ENCOUNTER — Encounter: Payer: Self-pay | Admitting: Licensed Clinical Social Worker

## 2024-07-29 ENCOUNTER — Other Ambulatory Visit: Payer: Self-pay | Admitting: Family Medicine

## 2024-07-29 ENCOUNTER — Other Ambulatory Visit: Payer: Self-pay | Admitting: Licensed Clinical Social Worker

## 2024-07-29 MED ORDER — LINACLOTIDE 145 MCG PO CAPS: 145.0000 ug | ORAL_CAPSULE | Freq: Every day | ORAL | 1 refills | Status: AC

## 2024-07-29 NOTE — Patient Instructions (Signed)
 Anna Hayes - I am sorry I was unable to reach you today for our scheduled appointment. I work with Teressa Harrie HERO, FNP and am calling to support your healthcare needs. Please contact me at [phone] at your earliest convenience. I look forward to speaking with you soon.   Thank you,  Tobias CHARM [name] HEDWIG, PhD [facility], [facility] Social Worker Direct Dial: [phone]  Fax: [phone]

## 2024-07-29 NOTE — Telephone Encounter (Signed)
 Copied from CRM [id number]. Topic: Clinical - Medication Refill >> Jul 29, 2024 10:15 AM Delon HERO wrote: Medication: LINZESS  145 MCG CAPS capsule [[id number]]  Has the patient contacted their pharmacy? Yes (Agent: If no, request that the patient contact the pharmacy for the refill. If patient does not wish to contact the pharmacy document the reason why and proceed with request.) (Agent: If yes, when and what did the pharmacy advise?)  This is the patient's preferred pharmacy:  [id number] - [place], [place] - [address] AT [facility] [address] [place] [address] Phone: [phone] Fax: [phone]  Is this the correct pharmacy for this prescription? Yes If no, delete pharmacy and type the correct one.   Has the prescription been filled recently? Yes  Is the patient out of the medication? Yes out of medication 1-2 days  Has the patient been seen for an appointment in the last year OR does the patient have an upcoming appointment? Yes  Can we respond through MyChart? Yes  Agent: Please be advised that Rx refills may take up to 3 business days. We ask that you follow-up with your pharmacy.

## 2024-07-30 ENCOUNTER — Other Ambulatory Visit: Payer: Self-pay | Admitting: Family Medicine

## 2024-08-01 ENCOUNTER — Encounter: Payer: Medicare PPO | Admitting: Family Medicine

## 2024-08-02 ENCOUNTER — Other Ambulatory Visit: Payer: Self-pay

## 2024-08-02 NOTE — Patient Outreach (Signed)
 Complex Care Management   Visit Note  08/02/2024  Name:  Anna Hayes MRN: [id number] DOB: [birth year]  Situation: Referral received for Complex Care Management related to Personal Care Assistance Resources I obtained verbal consent from Patient.  Visit completed with Patient  on the phone  Background:   Past Medical History:  Diagnosis Date   Anemia    Constipation    DVT of lower extremity (deep venous thrombosis) (HCC)    Frequent headaches    Heart murmur    found during pregnancy -  no problems   Hypertension    Muscle pain    Palpitations    Septic thrombophlebitis 08/31/2017   Sinus problem    Stroke [facility])     Assessment: Patient without any health questions or concerns at this time. Continues to work with GAN[employer] CO re: community resources. Patient Reported Symptoms:  Cognitive Cognitive Status: No symptoms reported      Neurological Neurological Review of Symptoms: No symptoms reported    HEENT HEENT Symptoms Reported: No symptoms reported      Cardiovascular Cardiovascular Symptoms Reported: No symptoms reported    Respiratory Respiratory Symptoms Reported: No symptoms reported    Endocrine Endocrine Symptoms Reported: No symptoms reported    Gastrointestinal Gastrointestinal Symptoms Reported: No symptoms reported      Genitourinary Genitourinary Symptoms Reported: No symptoms reported    Integumentary Integumentary Symptoms Reported: No symptoms reported    Musculoskeletal Musculoskelatal Symptoms Reviewed: No symptoms reported Additional Musculoskeletal Details: Left AKA, Patient uses motorized chair Musculoskeletal Management Strategies: Activity, Adequate rest, Routine screening Musculoskeletal Self-Management Outcome: 4 (good)      Psychosocial Psychosocial Symptoms Reported: No symptoms reported          There were no vitals filed for this visit.  Medications Reviewed Today     Reviewed by Samule Life M, RN (Registered Nurse) on  08/02/24 at 1314  Med List Status: <None>   Medication Order Taking? Sig Documenting Provider Last Dose Status Informant  atenolol  (TENORMIN ) 50 MG tablet [id number] Yes TAKE 1 TABLET BY MOUTH EVERY DAY Cox, Kirsten, MD  Active   butalbital -acetaminophen -caffeine  (FIORICET) 50-325-40 MG tablet [id number] Yes Take 1 tablet by mouth every 6 (six) hours as needed for migraine. Teressa Harrie HERO, FNP  Active   cyanocobalamin  (,VITAMIN B-12,) 1000 MCG/ML injection [id number]  Inject into the muscle.  Patient not taking: Reported on 08/02/2024   [provider]  Active   fluticasone  (FLONASE ) 50 MCG/ACT nasal spray [id number] Yes SPRAY 2 SPRAYS INTO EACH NOSTRIL EVERY DAY Cox, Kirsten, MD  Active   furosemide (LASIX) 20 MG tablet [id number] Yes Take 20 mg by mouth daily as needed for fluid.  [provider]  Active Self           Med Note VIVIANN TILLMAN HERO Stevan Jun 28, 2017 12:15 PM)    levothyroxine  (SYNTHROID ) 50 MCG tablet [id number] Yes TAKE 1 TABLET BY MOUTH EVERY DAY Teressa Harrie HERO, FNP  Active   linaclotide  (LINZESS ) 145 MCG CAPS capsule [id number] Yes Take 1 capsule (145 mcg total) by mouth daily before breakfast. Teressa Harrie HERO, FNP  Active   losartan  (COZAAR ) 50 MG tablet [id number] Yes TAKE 1 TABLET BY MOUTH EVERY DAY Teressa Harrie HERO, FNP  Active   NIFEdipine  (PROCARDIA  XL) 30 MG 24 hr tablet [id number] Yes Take 1 tablet (30 mg total) by mouth daily. Cox, Kirsten, MD  Active   omega-3 acid ethyl esters (LOVAZA ) 1 g capsule [id number] Yes  TAKE 2 CAPSULES BY MOUTH 2 TIMES DAILY. Cox, Kirsten, MD  Active   omeprazole  (PRILOSEC) 40 MG capsule [id number] Yes Take 1 capsule (40 mg total) by mouth daily. Sherre Clapper, MD  Active   ondansetron  (ZOFRAN ) 4 MG tablet [id number] Yes TAKE 1 TABLET BY MOUTH EVERY 8 HOURS AS NEEDED FOR NAUSEA AND VOMITING Teressa Harrie HERO, FNP  Active   simvastatin  (ZOCOR ) 40 MG tablet [id number] Yes TAKE 1 TABLET BY MOUTH EVERY DAY Teressa Harrie HERO, FNP  Active   XARELTO  20  MG TABS tablet [id number] Yes TAKE 1 TABLET BY MOUTH DAILY WITH SUPPER. Teressa Harrie HERO, FNP  Active           Recommendation:   Continue Current Plan of Care  Follow Up Plan:   Telephone follow up appointment date/time:  09/05/24 at 11:30 am  Heddy Shutter, RN, MSN, BSN, CCM [place]  [facility], Population Health Case Manager Phone: [phone]

## 2024-08-02 NOTE — Patient Instructions (Signed)
 Visit Information  Thank you for taking time to visit with me today. Please don't hesitate to contact me if I can be of assistance to you before our next scheduled appointment.  Your next care management appointment is by telephone on 09/05/24 at 11:30 am   Please call the care guide team at [phone] if you need to cancel, schedule, or reschedule an appointment.   Please call the Suicide and Crisis Lifeline: 988 call the [place]  National Suicide Prevention Lifeline: (902)481-8033 or TTY: 315-343-4519 TTY (970)512-2280) to talk to a trained counselor if you are experiencing a Mental Health or Behavioral Health Crisis or need someone to talk to.   Heddy Shutter, RN, MSN, BSN, CCM [place]  [facility], Population Health Case Manager Phone: [phone]

## 2024-08-12 ENCOUNTER — Encounter: Payer: Self-pay | Admitting: Licensed Clinical Social Worker

## 2024-08-12 ENCOUNTER — Other Ambulatory Visit: Payer: Self-pay | Admitting: Licensed Clinical Social Worker

## 2024-08-12 NOTE — Patient Instructions (Signed)
 Anna Hayes - I am sorry I was unable to reach you today for our scheduled appointment. I work with Teressa Harrie HERO, FNP and am calling to support your healthcare needs. Please contact me at [phone] at your earliest convenience. I look forward to speaking with you soon.   Thank you,  Tobias CHARM [name] HEDWIG, PhD [facility], [facility] Social Worker Direct Dial: [phone]  Fax: [phone]

## 2024-08-26 ENCOUNTER — Other Ambulatory Visit: Payer: Self-pay | Admitting: Licensed Clinical Social Worker

## 2024-08-26 NOTE — Patient Outreach (Signed)
 Social Drivers of Health  Community Resource and Care Coordination Visit Note   08/26/2024  Name: Anna Hayes MRN: [id number] DOB:15-Jan-1951  Situation: Referral received for [facility] needs assessment and assistance related to Financial Strain  Food Insecurity  [facility] . I obtained verbal consent from Patient.  Visit completed with Patient on the phone.   Background:   SDOH Interventions Today    Flowsheet Row Most Recent Value  SDOH Interventions   Food Insecurity Interventions Intervention Not Indicated  Housing Interventions Intervention Not Indicated  Transportation Interventions Intervention Not Indicated  Utilities Interventions Intervention Not Indicated     Assessment:   Goals Addressed             This Visit's Progress    BSW VBCI Social Work Care Plan       Problems:   Food Insecurity  and does not have any food issues but would like to apply for food stamps  CSW Clinical Goal(s):   Over the next 2 weeks the Patient will will follow up with completing the FNS application online or paper copy as directed by Social Work.  Interventions:  SW will mail the paper copy and also provide the link to the e-pass to complete the food stamp application SW will also make a referral to [facility].  Patient Goals/Self-Care Activities:  Patient will complete the FNS application    Plan:   Telephone follow up appointment with care management team member scheduled for:  09/16/2024 at 10:00 am        Recommendation:   call and/or follow up with DSS application for food assistance  Follow Up Plan:   Telephone follow up appointment date/time:  12/22/*2025 at 10:00 am  Tobias CHARM [name] HEDWIG, PhD [facility], [facility] Social Worker Direct Dial: [phone]  Fax: [phone]

## 2024-08-26 NOTE — Patient Instructions (Signed)
 Visit Information  Thank you for taking time to visit with me today. Please don't hesitate to contact me if I can be of assistance to you before our next scheduled appointment.  Your next care management appointment is by telephone on 09/16/2024 at 10:00 am    Please call the care guide team at [phone] if you need to cancel, schedule, or reschedule an appointment.   Please call the Suicide and Crisis Lifeline: 988 go to [facility] [address] [place] [phone]) call 911 if you are experiencing a Mental Health or Behavioral Health Crisis or need someone to talk to. Tobias CHARM [name] HEDWIG, PhD [facility], [facility] Social Worker Direct Dial: [phone]  Fax: [phone]

## 2024-08-29 ENCOUNTER — Encounter: Payer: Self-pay | Admitting: Family Medicine

## 2024-08-29 ENCOUNTER — Ambulatory Visit: Admitting: Family Medicine

## 2024-08-29 VITALS — BP 136/70 | HR 50 | Temp 98.0°F | Resp 16 | Ht 62.5 in | Wt 166.2 lb

## 2024-08-29 DIAGNOSIS — E038 Other specified hypothyroidism: Secondary | ICD-10-CM | POA: Diagnosis not present

## 2024-08-29 DIAGNOSIS — E0859 Diabetes mellitus due to underlying condition with other circulatory complications: Secondary | ICD-10-CM | POA: Diagnosis not present

## 2024-08-29 DIAGNOSIS — I10 Essential (primary) hypertension: Secondary | ICD-10-CM

## 2024-08-29 DIAGNOSIS — Z23 Encounter for immunization: Secondary | ICD-10-CM

## 2024-08-29 DIAGNOSIS — K5900 Constipation, unspecified: Secondary | ICD-10-CM | POA: Diagnosis not present

## 2024-08-29 DIAGNOSIS — M25531 Pain in right wrist: Secondary | ICD-10-CM | POA: Diagnosis not present

## 2024-08-29 DIAGNOSIS — G43009 Migraine without aura, not intractable, without status migrainosus: Secondary | ICD-10-CM | POA: Insufficient documentation

## 2024-08-29 DIAGNOSIS — S78112A Complete traumatic amputation at level between left hip and knee, initial encounter: Secondary | ICD-10-CM | POA: Diagnosis not present

## 2024-08-29 DIAGNOSIS — K219 Gastro-esophageal reflux disease without esophagitis: Secondary | ICD-10-CM | POA: Diagnosis not present

## 2024-08-29 DIAGNOSIS — M25431 Effusion, right wrist: Secondary | ICD-10-CM | POA: Diagnosis not present

## 2024-08-29 DIAGNOSIS — E782 Mixed hyperlipidemia: Secondary | ICD-10-CM | POA: Diagnosis not present

## 2024-08-29 DIAGNOSIS — D509 Iron deficiency anemia, unspecified: Secondary | ICD-10-CM

## 2024-08-29 MED ORDER — BUTALBITAL-APAP-CAFFEINE 50-325-40 MG PO TABS: 1.0000 | ORAL_TABLET | Freq: Four times a day (QID) | ORAL | 2 refills | Status: DC | PRN

## 2024-08-29 MED ORDER — KETOROLAC TROMETHAMINE 30 MG/ML IJ SOLN
30.0000 mg | Freq: Once | INTRAMUSCULAR | Status: AC
  Administered 2024-08-29: 30 mg via INTRAMUSCULAR

## 2024-08-29 NOTE — Assessment & Plan Note (Addendum)
Continue good skin care. Follows with vascular and vein.

## 2024-08-29 NOTE — Assessment & Plan Note (Addendum)
 Previously well controlled Lab Results  Component Value Date   TSH 2.450 02/26/2024  Continue Synthroid  at current dose  Recheck TSH and adjust Synthroid  as indicated  Orders:   T4 AND TSH

## 2024-08-29 NOTE — Assessment & Plan Note (Addendum)
 Well controlled.  Last lipids Lab Results  Component Value Date   CHOL 155 05/28/2024   HDL 50 05/28/2024   LDLCALC 79 05/28/2024   TRIG 153 (H) 05/28/2024   CHOLHDL 3.1 05/28/2024   No changes to medicines. Simvastatin  40 mg, Lovaza  1 gm 2 capsules BID Continue to work on eating a healthy diet and work on chair exercises.  Labs drawn today.  Orders:   Lipid panel   CBC with Differential/Platelet   CMP14+EGFR

## 2024-08-29 NOTE — Assessment & Plan Note (Addendum)
 Hypertension Slightly elevated blood pressure, possibly due to wrist pain. Managed with atenolol , losartan , and Procardia . BP Readings from Last 3 Encounters:  08/29/24 136/70  05/28/24 124/62  02/26/24 (!) 140/72  - Continue current antihypertensive medications.

## 2024-08-29 NOTE — Assessment & Plan Note (Addendum)
 Headache and neck pain Headache and neck pain likely secondary to wrist pain. - Administered Toradol  injection. Orders:   butalbital -acetaminophen -caffeine  (FIORICET) 50-325-40 MG tablet; Take 1 tablet by mouth every 6 (six) hours as needed for migraine.   ketorolac  (TORADOL ) 30 MG/ML injection 30 mg

## 2024-08-29 NOTE — Progress Notes (Signed)
 Subjective:  Patient ID: Anna Hayes, female    DOB: 01/22/1951  Age: 73 y.o. MRN: [id number]  Chief Complaint  Patient presents with   Medical Management of Chronic Issues   Discussed the use of AI scribe software for clinical note transcription with the patient, who gave verbal consent to proceed.  History of Present Illness   Anna Hayes is a 73 year old female with hypertension and iron  deficiency anemia who presents with wrist pain and headache.  Wrist pain and swelling - Acute onset of wrist pain and swelling beginning this morning - Pain radiates up the arm into the neck - No use of topical treatments such as cream or ice - Suspects pain may be related to lifting a heavy iron  skillet - No recent increase in manual activity  Headache - Frequent headaches, occurring with every doctor visit - Current headache attributed to wrist pain radiating into the neck - Uses Fioricet for headache relief, but infrequently  Iron  deficiency anemia - History of iron  deficiency anemia - Received iron  infusions in the past, most recently in April - Previously received daily iron  injections during youth - No recent hematochezia  Hypertension and chronic medication use - Hypertension managed with atenolol , losartan , and nifedipine  (Procardia ) - Synthroid  50 mg for thyroid  dysfunction - Lovaza  and simvastatin  for hyperlipidemia - Omeprazole  for gastroesophageal reflux disease - Zofran  as needed - Flonase  and Lasix used only when necessary           06/20/2024    1:34 PM 04/27/2023   11:16 AM 10/13/2022    1:52 PM 07/27/2022   10:49 AM 06/29/2022    9:30 AM  Depression screen PHQ 2/9  Decreased Interest 0 0 0 0 0  Down, Depressed, Hopeless 0 0 0 0 0  PHQ - 2 Score 0 0 0 0 0  Altered sleeping  0  0 0  Tired, decreased energy  0  0 0  Change in appetite  0  0 0  Feeling bad or failure about yourself   0  0 0  Trouble concentrating  0  0 0  Moving slowly or  fidgety/restless  0  0 0  Suicidal thoughts  0  0 0  PHQ-9 Score  0   0  0   Difficult doing work/chores  Not difficult at all  Not difficult at all Not difficult at all     Data saved with a previous flowsheet row definition        07/03/2024   10:02 AM  Fall Risk   Falls in the past year? 0    Patient Care Team: Teressa Harrie HERO, FNP as PCP - General (Family Medicine) [name] Lister D as Social Worker Wallace, Juana M, RN as [facility] Care Management   Review of Systems  Constitutional:  Negative for chills, diaphoresis, fatigue and fever.  HENT:  Negative for congestion, ear pain and sinus pain.   Eyes: Negative.   Respiratory:  Negative for cough and shortness of breath.   Cardiovascular:  Negative for chest pain.  Gastrointestinal:  Negative for abdominal pain, constipation, diarrhea, nausea and vomiting.  Endocrine: Negative.   Genitourinary:  Negative for dysuria.  Musculoskeletal:  Negative for arthralgias.  Allergic/Immunologic: Negative.   Neurological:  Negative for dizziness, weakness, light-headedness and headaches.  Hematological: Negative.   Psychiatric/Behavioral:  Negative for dysphoric mood. The patient is not nervous/anxious.     Current Outpatient Medications on File Prior to Visit  Medication Sig  Dispense Refill   atenolol  (TENORMIN ) 50 MG tablet TAKE 1 TABLET BY MOUTH EVERY DAY 90 tablet 1   cyanocobalamin  (,VITAMIN B-12,) 1000 MCG/ML injection Inject into the muscle.     fluticasone  (FLONASE ) 50 MCG/ACT nasal spray SPRAY 2 SPRAYS INTO EACH NOSTRIL EVERY DAY 48 mL 2   furosemide (LASIX) 20 MG tablet Take 20 mg by mouth daily as needed for fluid.      levothyroxine  (SYNTHROID ) 50 MCG tablet TAKE 1 TABLET BY MOUTH EVERY DAY 90 tablet 1   linaclotide  (LINZESS ) 145 MCG CAPS capsule Take 1 capsule (145 mcg total) by mouth daily before breakfast. 90 capsule 1   losartan  (COZAAR ) 50 MG tablet TAKE 1 TABLET BY MOUTH EVERY DAY 90 tablet 1   NIFEdipine  (PROCARDIA  XL) 30  MG 24 hr tablet Take 1 tablet (30 mg total) by mouth daily. 90 tablet 1   omega-3 acid ethyl esters (LOVAZA ) 1 g capsule TAKE 2 CAPSULES BY MOUTH 2 TIMES DAILY. 360 capsule 1   omeprazole  (PRILOSEC) 40 MG capsule Take 1 capsule (40 mg total) by mouth daily. 90 capsule 1   ondansetron  (ZOFRAN ) 4 MG tablet TAKE 1 TABLET BY MOUTH EVERY 8 HOURS AS NEEDED FOR NAUSEA AND VOMITING 90 tablet 1   simvastatin  (ZOCOR ) 40 MG tablet TAKE 1 TABLET BY MOUTH EVERY DAY 90 tablet 1   XARELTO  20 MG TABS tablet TAKE 1 TABLET BY MOUTH DAILY WITH SUPPER. 90 tablet 1   No current facility-administered medications on file prior to visit.   Past Medical History:  Diagnosis Date   Anemia    Constipation    DVT of lower extremity (deep venous thrombosis) (HCC)    Frequent headaches    Heart murmur    found during pregnancy -  no problems   Hypertension    Muscle pain    Palpitations    Septic thrombophlebitis 08/31/2017   Sinus problem    Stroke [facility])    Past Surgical History:  Procedure Laterality Date   ABDOMINAL EXPLORATION SURGERY  05/2017   LOA and release of SBO, repair of small bowel perforation.   AMPUTATION Left 07/05/2017   Procedure: LEFT ABOVE KNEE AMPUTATION;  Surgeon: Laurence Redell CROME, MD;  Location: [facility] OR;  Service: Vascular;  Laterality: Left;   CATARACT EXTRACTION W/PHACO Right 06/12/2018   Procedure: CATARACT EXTRACTION WITH INTRAOCULAR LENS IMPLANT;  Surgeon: Tobie Baptist, MD;  Location: [facility] OR;  Service: Ophthalmology;  Laterality: Right;   COLONOSCOPY  11/26/2015   Dr Larene - follow-up in 5 years   EMBOLECTOMY Bilateral 06/27/2017   Procedure: BILATERAL FEMORAL POPLITEAL EMBOLECTOMY;  Surgeon: Serene Gaile ORN, MD;  Location: [facility] OR;  Service: Vascular;  Laterality: Bilateral;   FASCIOTOMY Left 06/27/2017   Procedure: FULL COMPARTMENT LEFT LOWER LEG FASCIOTOMY;  Surgeon: Serene Gaile ORN, MD;  Location: [facility] OR;  Service: Vascular;  Laterality: Left;   PARTIAL HYSTERECTOMY     PATCH  ANGIOPLASTY Left 06/27/2017   Procedure: PATCH ANGIOPLASTY Left Posterior Tibial Artery;  Surgeon: Serene Gaile ORN, MD;  Location: [facility] OR;  Service: Vascular;  Laterality: Left;   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 07/11/2017   Procedure: REPAIR OF COMPLEX TRACTION RETINAL DETACHMENT WITH ENDO LASER AND ANTIFUNGAL INJECTION;  Surgeon: Tobie Baptist, MD;  Location: [facility] OR;  Service: Ophthalmology;  Laterality: Right;   TEE WITHOUT CARDIOVERSION N/A 07/11/2017   Procedure: TRANSESOPHAGEAL ECHOCARDIOGRAM (TEE);  Surgeon: Alveta Aleene PARAS, MD;  Location[facility]COPY;  Service: Cardiovascular;  Laterality:  N/A;    Family History  Problem Relation Age of Onset   Breast cancer Mother    Prostate cancer Father    Prostate cancer Brother    Social History   Socioeconomic History   Marital status: Widowed    Spouse name: Not on file   Number of children: Not on file   Years of education: Not on file   Highest education level: Not on file  Occupational History   Not on file  Tobacco Use   Smoking status: Some Days    Current packs/day: 0.25    Average packs/day: 0.3 packs/day for 53.0 years (13.3 ttl pk-yrs)    Types: Cigarettes   Smokeless tobacco: Never  Vaping Use   Vaping status: Never Used  Substance and Sexual Activity   Alcohol use: No   Drug use: No   Sexual activity: Not Currently  Other Topics Concern   Not on file  Social History Narrative   Lives with her daughter   Social Drivers of Health   Financial Resource Strain: Low Risk  (07/31/2023)   Overall Financial Resource Strain (CARDIA)    Difficulty of Paying Living Expenses: Not hard at all  Food Insecurity: No Food Insecurity (08/26/2024)   Hunger Vital Sign    Worried About Running Out of Food in the Last Year: Never true    Ran Out of Food in the Last Year: Never true  Transportation Needs: No Transportation Needs (08/26/2024)   PRAPARE - Administrator, Civil Service (Medical): No     Lack of Transportation (Non-Medical): No  Physical Activity: Insufficiently Active (07/31/2023)   Exercise Vital Sign    Days of Exercise per Week: 1 day    Minutes of Exercise per Session: 60 min  Stress: No Stress Concern Present (07/31/2023)   [employer] of Occupational Health - Occupational Stress Questionnaire    Feeling of Stress : Not at all  Social Connections: Moderately Isolated (07/31/2023)   Social Connection and Isolation Panel    Frequency of Communication with Friends and Family: More than three times a week    Frequency of Social Gatherings with Friends and Family: More than three times a week    Attends Religious Services: 1 to 4 times per year    Active Member of [employer] or Organizations: No    Attends Banker Meetings: Never    Marital Status: Widowed    Objective:  BP 136/70   Pulse (!) 50   Temp 98 F (36.7 C) (Temporal)   Resp 16   Ht 5' 2.5 (1.588 m)   Wt 166 lb 3.2 oz (75.4 kg)   SpO2 98%   BMI 29.91 kg/m      08/29/2024    9:59 AM 05/28/2024    9:50 AM 02/26/2024   10:14 AM  BP/Weight  Systolic BP 136 124 140  Diastolic BP 70 62 72  Wt. (Lbs) 166.2 184   BMI 29.91 kg/m2 33.12 kg/m2     Physical Exam Vitals reviewed.  Constitutional:      General: She is not in acute distress.    Appearance: Normal appearance. She is overweight. She is not ill-appearing.  Eyes:     Conjunctiva/sclera: Conjunctivae normal.  Cardiovascular:     Rate and Rhythm: Normal rate and regular rhythm.     Heart sounds: Normal heart sounds. No murmur heard. Pulmonary:     Effort: Pulmonary effort is normal. No respiratory distress.     Breath  sounds: Normal breath sounds.  Abdominal:     General: Bowel sounds are normal. There is no distension.     Palpations: Abdomen is soft.     Tenderness: There is no abdominal tenderness.  Musculoskeletal:        General: Normal range of motion.  Skin:    General: Skin is warm.  Neurological:     Mental  Status: She is alert. Mental status is at baseline.  Psychiatric:        Mood and Affect: Mood normal.        Behavior: Behavior normal.     Lab Results  Component Value Date   WBC 7.0 05/28/2024   HGB 10.6 (L) 05/28/2024   HCT 34.1 05/28/2024   PLT 450 05/28/2024   GLUCOSE 86 05/28/2024   CHOL 155 05/28/2024   TRIG 153 (H) 05/28/2024   HDL 50 05/28/2024   LDLCALC 79 05/28/2024   ALT 14 05/28/2024   AST 18 05/28/2024   NA 136 05/28/2024   K 5.1 05/28/2024   CL 102 05/28/2024   CREATININE 0.88 05/28/2024   BUN 12 05/28/2024   CO2 18 (L) 05/28/2024   TSH 2.450 02/26/2024   INR 1.3 (H) 12/08/2022   HGBA1C 6.0 (H) 05/28/2024    Results for orders placed or performed in visit on 05/28/24  Hemoglobin A1c   Collection Time: 05/28/24 11:10 AM  Result Value Ref Range   Hgb A1c MFr Bld 6.0 (H) 4.8 - 5.6 %   Est. average glucose Bld gHb Est-mCnc 126 mg/dL  Lipid panel   Collection Time: 05/28/24 11:10 AM  Result Value Ref Range   Cholesterol, Total 155 100 - 199 mg/dL   Triglycerides 846 (H) 0 - 149 mg/dL   HDL 50 >60 mg/dL   VLDL Cholesterol Cal 26 5 - 40 mg/dL   LDL Chol Calc (NIH) 79 0 - 99 mg/dL   Chol/HDL Ratio 3.1 0.0 - 4.4 ratio  CBC with Differential/Platelet   Collection Time: 05/28/24 11:10 AM  Result Value Ref Range   WBC 7.0 3.4 - 10.8 x10E3/uL   RBC 4.19 3.77 - 5.28 x10E6/uL   Hemoglobin 10.6 (L) 11.1 - 15.9 g/dL   Hematocrit 65.8 65.9 - 46.6 %   MCV 81 79 - 97 fL   MCH 25.3 (L) 26.6 - 33.0 pg   MCHC 31.1 (L) 31.5 - 35.7 g/dL   RDW 83.8 (H) 88.2 - 84.5 %   Platelets 450 150 - 450 x10E3/uL   Neutrophils 54 Not Estab. %   Lymphs 30 Not Estab. %   Monocytes 10 Not Estab. %   Eos 5 Not Estab. %   Basos 1 Not Estab. %   Neutrophils Absolute 3.9 1.4 - 7.0 x10E3/uL   Lymphocytes Absolute 2.1 0.7 - 3.1 x10E3/uL   Monocytes Absolute 0.7 0.1 - 0.9 x10E3/uL   EOS (ABSOLUTE) 0.3 0.0 - 0.4 x10E3/uL   Basophils Absolute 0.0 0.0 - 0.2 x10E3/uL   Immature  Granulocytes 0 Not Estab. %   Immature Grans (Abs) 0.0 0.0 - 0.1 x10E3/uL  CMP14+EGFR   Collection Time: 05/28/24 11:10 AM  Result Value Ref Range   Glucose 86 70 - 99 mg/dL   BUN 12 8 - 27 mg/dL   Creatinine, Ser 9.11 0.57 - 1.00 mg/dL   eGFR 68 >40 fO/fpw/8.26   BUN/Creatinine Ratio 14 12 - 28   Sodium 136 134 - 144 mmol/L   Potassium 5.1 3.5 - 5.2 mmol/L   Chloride 102 96 -  106 mmol/L   CO2 18 (L) 20 - 29 mmol/L   Calcium 9.6 8.7 - 10.3 mg/dL   Total Protein 6.9 6.0 - 8.5 g/dL   Albumin 4.3 3.8 - 4.8 g/dL   Globulin, Total 2.6 1.5 - 4.5 g/dL   Bilirubin Total 0.2 0.0 - 1.2 mg/dL   Alkaline Phosphatase 77 44 - 121 IU/L   AST 18 0 - 40 IU/L   ALT 14 0 - 32 IU/L  .  Assessment & Plan:   Assessment & Plan Essential (primary) hypertension Hypertension Slightly elevated blood pressure, possibly due to wrist pain. Managed with atenolol , losartan , and Procardia . BP Readings from Last 3 Encounters:  08/29/24 136/70  05/28/24 124/62  02/26/24 (!) 140/72  - Continue current antihypertensive medications.    Mixed hyperlipidemia Well controlled.  Last lipids Lab Results  Component Value Date   CHOL 155 05/28/2024   HDL 50 05/28/2024   LDLCALC 79 05/28/2024   TRIG 153 (H) 05/28/2024   CHOLHDL 3.1 05/28/2024   No changes to medicines. Simvastatin  40 mg, Lovaza  1 gm 2 capsules BID Continue to work on eating a healthy diet and work on chair exercises.  Labs drawn today.  Orders:   Lipid panel   CBC with Differential/Platelet   CMP14+EGFR   Diabetes due to underlying condition w oth circulatory comp (HCC) Stable. Monitoring and lifestyle modifications needed. Lab Results  Component Value Date   HGBA1C 6.0 (H) 05/28/2024  - Recommend continue working on diet - Order repeat hemoglobin A1c. Orders:   Hemoglobin A1c   CMP14+EGFR   Secondary hypothyroidism Previously well controlled Lab Results  Component Value Date   TSH 2.450 02/26/2024  Continue Synthroid  at  current dose  Recheck TSH and adjust Synthroid  as indicated  Orders:   T4 AND TSH   Migraine without aura and without status migrainosus, not intractable Headache and neck pain Headache and neck pain likely secondary to wrist pain. - Administered Toradol  injection. Orders:   butalbital -acetaminophen -caffeine  (FIORICET) 50-325-40 MG tablet; Take 1 tablet by mouth every 6 (six) hours as needed for migraine.   ketorolac  (TORADOL ) 30 MG/ML injection 30 mg  Iron  deficiency anemia, unspecified iron  deficiency anemia type Lab Results  Component Value Date   WBC 7.0 05/28/2024   HGB 10.6 (L) 05/28/2024   HCT 34.1 05/28/2024   MCV 81 05/28/2024   PLT 450 05/28/2024  History of iron  deficiency anemia Iron  deficiency anemia with previous infusions. Last infusion in April. No recent blood in stool. - Checked hemoglobin and iron  levels. - Forward results to Dr. Molli Orders:   Iron , TIBC and Ferritin Panel   Gastroesophageal reflux disease without esophagitis Gastroesophageal reflux disease - Continue omeprazole .    Pain and swelling of right wrist Right wrist pain and swelling Acute right wrist pain with swelling, likely from overuse or lifting. Pain radiates to neck causing headache. Prefers conservative management. - Apply topical ointment to wrist. - Use ACE bandage or wrist splint. - Consider x-ray if pain persists.      Constipation, unspecified constipation type Well controlled - Continue Linzess .     Amputation of left lower extremity above knee upon examination (HCC) Continue good skin care. Follows with vascular and vein.    Encounter for immunization General Health Maintenance Due for flu vaccination. - Administered high-dose flu vaccine.  Orders:   Flu vaccine HIGH DOSE PF(Fluzone Trivalent)      Meds ordered this encounter  Medications   butalbital -acetaminophen -caffeine  (FIORICET) 50-325-40 MG tablet  Sig: Take 1 tablet by mouth every 6 (six) hours  as needed for migraine.    Dispense:  30 tablet    Refill:  2   ketorolac  (TORADOL ) 30 MG/ML injection 30 mg    Orders Placed This Encounter  Procedures   Flu vaccine HIGH DOSE PF(Fluzone Trivalent)   Hemoglobin A1c   Lipid panel   CBC with Differential/Platelet   CMP14+EGFR   T4 AND TSH   Iron , TIBC and Ferritin Panel       Follow-up: Return in about 3 months (around 11/27/2024).  An After Visit Summary was printed and given to the patient.  Total time spent on today's visit was 45 minutes, including both face-to-face time and nonface-to-face time personally spent on review of chart, discussing labs and goals, discussing further work-up, treatment options, reviewing outside records if pertinent, answering patient's questions, and coordinating care.    Harrie Cedar, FNP [facility] [phone]

## 2024-08-29 NOTE — Assessment & Plan Note (Addendum)
 Lab Results  Component Value Date   WBC 7.0 05/28/2024   HGB 10.6 (L) 05/28/2024   HCT 34.1 05/28/2024   MCV 81 05/28/2024   PLT 450 05/28/2024  History of iron  deficiency anemia Iron  deficiency anemia with previous infusions. Last infusion in April. No recent blood in stool. - Checked hemoglobin and iron  levels. - Forward results to Dr. Molli Orders:   Iron , TIBC and Ferritin Panel

## 2024-08-29 NOTE — Assessment & Plan Note (Addendum)
 Stable. Monitoring and lifestyle modifications needed. Lab Results  Component Value Date   HGBA1C 6.0 (H) 05/28/2024  - Recommend continue working on diet - Order repeat hemoglobin A1c. Orders:   Hemoglobin A1c   CMP14+EGFR

## 2024-08-29 NOTE — Assessment & Plan Note (Addendum)
 Right wrist pain and swelling Acute right wrist pain with swelling, likely from overuse or lifting. Pain radiates to neck causing headache. Prefers conservative management. - Apply topical ointment to wrist. - Use ACE bandage or wrist splint. - Consider x-ray if pain persists.

## 2024-08-29 NOTE — Assessment & Plan Note (Signed)
 Gastroesophageal reflux disease - Continue omeprazole .

## 2024-08-29 NOTE — Assessment & Plan Note (Addendum)
 Well controlled - Continue Linzess .

## 2024-08-30 ENCOUNTER — Ambulatory Visit: Payer: Self-pay | Admitting: Family Medicine

## 2024-08-30 LAB — IRON,TIBC AND FERRITIN PANEL
Ferritin: 50 ng/mL (ref 15–150)
Iron Saturation: 14 % — ABNORMAL LOW (ref 15–55)
Iron: 52 ug/dL (ref 27–139)
Total Iron Binding Capacity: 371 ug/dL (ref 250–450)
UIBC: 319 ug/dL (ref 118–369)

## 2024-08-30 LAB — CMP14+EGFR
ALT: 13 IU/L (ref 0–32)
AST: 16 IU/L (ref 0–40)
Albumin: 4.5 g/dL (ref 3.8–4.8)
Alkaline Phosphatase: 94 IU/L (ref 49–135)
BUN/Creatinine Ratio: 14 (ref 12–28)
BUN: 13 mg/dL (ref 8–27)
Bilirubin Total: 0.2 mg/dL (ref 0.0–1.2)
CO2: 21 mmol/L (ref 20–29)
Calcium: 10.2 mg/dL (ref 8.7–10.3)
Chloride: 100 mmol/L (ref 96–106)
Creatinine, Ser: 0.91 mg/dL (ref 0.57–1.00)
Globulin, Total: 3.1 g/dL (ref 1.5–4.5)
Glucose: 94 mg/dL (ref 70–99)
Potassium: 4.8 mmol/L (ref 3.5–5.2)
Sodium: 137 mmol/L (ref 134–144)
Total Protein: 7.6 g/dL (ref 6.0–8.5)
eGFR: 66 mL/min/1.73 (ref >59)

## 2024-08-30 LAB — CBC WITH DIFFERENTIAL/PLATELET
Basophils Absolute: 0 x10E3/uL (ref 0.0–0.2)
Basos: 1 %
EOS (ABSOLUTE): 0.3 x10E3/uL (ref 0.0–0.4)
Eos: 5 %
Hematocrit: 36.5 % (ref 34.0–46.6)
Hemoglobin: 11.6 g/dL (ref 11.1–15.9)
Immature Grans (Abs): 0 x10E3/uL (ref 0.0–0.1)
Immature Granulocytes: 0 %
Lymphocytes Absolute: 2 x10E3/uL (ref 0.7–3.1)
Lymphs: 36 %
MCH: 25.2 pg — ABNORMAL LOW (ref 26.6–33.0)
MCHC: 31.8 g/dL (ref 31.5–35.7)
MCV: 79 fL (ref 79–97)
Monocytes Absolute: 0.5 x10E3/uL (ref 0.1–0.9)
Monocytes: 10 %
Neutrophils Absolute: 2.7 x10E3/uL (ref 1.4–7.0)
Neutrophils: 48 %
Platelets: 529 x10E3/uL — ABNORMAL HIGH (ref 150–450)
RBC: 4.6 x10E6/uL (ref 3.77–5.28)
RDW: 15.4 % (ref 11.7–15.4)
WBC: 5.6 x10E3/uL (ref 3.4–10.8)

## 2024-08-30 LAB — LIPID PANEL
Chol/HDL Ratio: 2.6 ratio (ref 0.0–4.4)
Cholesterol, Total: 143 mg/dL (ref 100–199)
HDL: 54 mg/dL (ref >39)
LDL Chol Calc (NIH): 64 mg/dL (ref 0–99)
Triglycerides: 144 mg/dL (ref 0–149)
VLDL Cholesterol Cal: 25 mg/dL (ref 5–40)

## 2024-08-30 LAB — HEMOGLOBIN A1C
Est. average glucose Bld gHb Est-mCnc: 120 mg/dL
Hgb A1c MFr Bld: 5.8 % — ABNORMAL HIGH (ref 4.8–5.6)

## 2024-08-30 LAB — T4 AND TSH
T4, Total: 5.9 ug/dL (ref 4.5–12.0)
TSH: 2.76 u[IU]/mL (ref 0.450–4.500)

## 2024-09-02 ENCOUNTER — Other Ambulatory Visit: Payer: Self-pay | Admitting: Family Medicine

## 2024-09-02 DIAGNOSIS — I10 Essential (primary) hypertension: Secondary | ICD-10-CM

## 2024-09-05 ENCOUNTER — Other Ambulatory Visit: Payer: Self-pay

## 2024-09-05 NOTE — Patient Outreach (Signed)
 Complex Care Management   Visit Note  09/05/2024  Name:  Anna Hayes MRN: [id number] DOB: 13-Jul-1951  Situation: Referral received for Complex Care Management related to Personal care assistant resources I obtained verbal consent from Patient.  Visit completed with Patient  on the phone  Background:   Past Medical History:  Diagnosis Date   Anemia    Constipation    DVT of lower extremity (deep venous thrombosis) (HCC)    Frequent headaches    Heart murmur    found during pregnancy -  no problems   Hypertension    Muscle pain    Palpitations    Septic thrombophlebitis 08/31/2017   Sinus problem    Stroke [facility])     Assessment: Patient denies any concerns at this time. Patient Reported Symptoms:  Cognitive Cognitive Status: No symptoms reported Cognitive/Intellectual Conditions Management [RPT]: None reported or documented in medical history or problem list   Health Maintenance Behaviors: Annual physical exam, Healthy diet Health Facilitated by: Healthy diet, Rest, Pain control  Neurological Neurological Review of Symptoms: No symptoms reported    HEENT HEENT Symptoms Reported: No symptoms reported      Cardiovascular Cardiovascular Symptoms Reported: No symptoms reported Does patient have uncontrolled Hypertension?: No    Respiratory Respiratory Symptoms Reported: No symptoms reported    Endocrine Endocrine Symptoms Reported: No symptoms reported Is patient diabetic?: No Endocrine Comment: A1C 5.8 on 08/29/24.  Gastrointestinal Gastrointestinal Symptoms Reported: No symptoms reported      Genitourinary Genitourinary Symptoms Reported: No symptoms reported    Integumentary Integumentary Symptoms Reported: No symptoms reported    Musculoskeletal Musculoskelatal Symptoms Reviewed: Limited mobility Other Musculoskeletal Symptoms: Patient evaluated for right wrist pain/swelling by PCP on 08/29/24. Left AKA-utilizes wheelchair. patient reports has a prosthesis  and uses intermittantly.        Psychosocial Psychosocial Symptoms Reported: No symptoms reported         There were no vitals filed for this visit. Pain Scale: 0-10 Pain Score: 0-No pain  Medications Reviewed Today     Reviewed by Jameison Haji M, RN (Registered Nurse) on 09/05/24 at 1135  Med List Status: <None>   Medication Order Taking? Sig Documenting Provider Last Dose Status Informant  atenolol  (TENORMIN ) 50 MG tablet [id number] Yes TAKE 1 TABLET BY MOUTH EVERY DAY Cox, Kirsten, MD  Active   butalbital -acetaminophen -caffeine  (FIORICET) 50-325-40 MG tablet [id number] Yes Take 1 tablet by mouth every 6 (six) hours as needed for migraine. Teressa Harrie HERO, FNP  Active   cyanocobalamin  (,VITAMIN B-12,) 1000 MCG/ML injection [id number] Yes Inject into the muscle. [provider]  Active   fluticasone  (FLONASE ) 50 MCG/ACT nasal spray [id number] Yes SPRAY 2 SPRAYS INTO EACH NOSTRIL EVERY DAY Cox, Kirsten, MD  Active   furosemide (LASIX) 20 MG tablet [id number] Yes Take 20 mg by mouth daily as needed for fluid.  [provider]  Active Self           Med Note VIVIANN TILLMAN HERO Stevan Jun 28, 2017 12:15 PM)    levothyroxine  (SYNTHROID ) 50 MCG tablet [id number] Yes TAKE 1 TABLET BY MOUTH EVERY DAY Teressa Harrie HERO, FNP  Active   linaclotide  (LINZESS ) 145 MCG CAPS capsule [id number] Yes Take 1 capsule (145 mcg total) by mouth daily before breakfast. Teressa Harrie HERO, FNP  Active   losartan  (COZAAR ) 50 MG tablet [id number] Yes TAKE 1 TABLET BY MOUTH EVERY DAY Teressa Harrie HERO, FNP  Active   NIFEdipine  (PROCARDIA -XL/NIFEDICAL-XL) 30 MG 24  hr tablet [id number] Yes TAKE 1 TABLET BY MOUTH EVERY DAY Cox, Kirsten, MD  Active   omega-3 acid ethyl esters (LOVAZA ) 1 g capsule [id number] Yes TAKE 2 CAPSULES BY MOUTH 2 TIMES DAILY. [facility], Kirsten, MD  Active   omeprazole  (PRILOSEC) 40 MG capsule [id number] Yes Take 1 capsule (40 mg total) by mouth daily. Sherre Clapper, MD  Active   ondansetron  (ZOFRAN ) 4  MG tablet [id number] Yes TAKE 1 TABLET BY MOUTH EVERY 8 HOURS AS NEEDED FOR NAUSEA AND VOMITING Teressa Harrie HERO, FNP  Active   simvastatin  (ZOCOR ) 40 MG tablet [id number] Yes TAKE 1 TABLET BY MOUTH EVERY DAY Teressa Harrie HERO, FNP  Active   XARELTO  20 MG TABS tablet [id number] Yes TAKE 1 TABLET BY MOUTH DAILY WITH SUPPER. Teressa Harrie HERO, FNP  Active           Recommendation:   Continue Current Plan of Care  Follow Up Plan:   Telephone follow up appointment date/time:  10/07/24 at 1:30 pm  Heddy Shutter, RN, MSN, BSN, CCM [place]  [facility], Population Health Case Manager Phone: [phone]

## 2024-09-05 NOTE — Patient Instructions (Signed)
 Visit Information  Thank you for taking time to visit with me today. Please don't hesitate to contact me if I can be of assistance to you before our next scheduled appointment.  Your next care management appointment is by telephone on 10/07/24 at 1:30 pm.   Please call the care guide team at [phone] if you need to cancel, schedule, or reschedule an appointment.   Please call the Suicide and Crisis Lifeline: 988 call the [place]  National Suicide Prevention Lifeline: 254-155-5166 or TTY: 620-091-6092 TTY 7074477264) to talk to a trained counselor if you are experiencing a Mental Health or Behavioral Health Crisis or need someone to talk to.  Heddy Shutter, RN, MSN, BSN, CCM [place]  [facility], Population Health Case Manager Phone: [phone]

## 2024-09-06 ENCOUNTER — Other Ambulatory Visit: Payer: Self-pay | Admitting: Family Medicine

## 2024-09-06 NOTE — Telephone Encounter (Signed)
 Copied from CRM #[id number]. Topic: Clinical - Medication Refill >> Sep 06, 2024  4:03 PM Hadassah PARAS wrote: Medication: XARELTO  20 MG TABS tablet   Has the patient contacted their pharmacy? No (Agent: If no, request that the patient contact the pharmacy for the refill. If patient does not wish to contact the pharmacy document the reason why and proceed with request.) (Agent: If yes, when and what did the pharmacy advise?)  This is the patient's preferred pharmacy:  [id number] - [place], [place] - [address] AT [facility] [address] [place] [address] Phone: [phone] Fax: [phone]  Is this the correct pharmacy for this prescription? Yes If no, delete pharmacy and type the correct one.   Has the prescription been filled recently? No  Is the patient out of the medication? Yes  Has the patient been seen for an appointment in the last year OR does the patient have an upcoming appointment? Yes  Can we respond through MyChart? No  Agent: Please be advised that Rx refills may take up to 3 business days. We ask that you follow-up with your pharmacy.

## 2024-09-09 ENCOUNTER — Encounter: Payer: Self-pay | Admitting: Oncology

## 2024-09-09 ENCOUNTER — Ambulatory Visit: Admitting: Podiatry

## 2024-09-09 MED ORDER — RIVAROXABAN 20 MG PO TABS: 20.0000 mg | ORAL_TABLET | Freq: Every day | ORAL | 1 refills | Status: AC

## 2024-09-10 ENCOUNTER — Other Ambulatory Visit: Payer: Self-pay | Admitting: Family Medicine

## 2024-09-10 DIAGNOSIS — I10 Essential (primary) hypertension: Secondary | ICD-10-CM

## 2024-09-16 ENCOUNTER — Other Ambulatory Visit: Payer: Self-pay | Admitting: Licensed Clinical Social Worker

## 2024-09-16 NOTE — Patient Outreach (Signed)
 Social Drivers of Health  Community Resource and Care Coordination Visit Note   09/16/2024  Name: Anna Hayes MRN: [id number] DOB:1951-09-26  Situation: Referral received for [employer] needs assessment and assistance related to Food Insecurity  F[place] application and [facility] care follow up. I obtained verbal consent from Patient.  Visit completed with Patient on the phone.   Background:      Assessment:   Goals Addressed             This Visit's Progress    COMPLETED: BSW VBCI Social Work Care Plan       Problems:   Food Insecurity  and does not have any food issues but would like to apply for food stamps  CSW Clinical Goal(s):   Over the next 2 weeks the Patient will will follow up with completing the FNS application online or paper copy as directed by Social Work.  Interventions:  SW will mail the paper copy and also provide the link to the e-pass to complete the food stamp application SW will also make a referral to [facility].  Patient Goals/Self-Care Activities:  Patient will complete the FNS application    Plan:   Telephone follow up appointment with care management team member scheduled for:  1/7/20260 1at 10:00 am     BSW VBCI Social Work Care Plan       Current SDOH Barriers:  FNS Application (pending) and [facility] care follow up  Interventions: Advised patient to follow up with the FNS application ad [facility] Care          Recommendation:   attend all scheduled provider appointments FNS   Follow Up Plan:   Telephone follow up appointment date/time:  10/02/2024 at 10:00 am  Tobias CHARM [name] HEDWIG, PhD [facility], [facility] Social Worker Direct Dial: [phone]  Fax: [phone]

## 2024-09-16 NOTE — Patient Instructions (Signed)
 Visit Information  Thank you for taking time to visit with me today. Please don't hesitate to contact me if I can be of assistance to you before our next scheduled appointment.  Your next care management appointment is by telephone on 10/03/2023 at 10:00 am   Please call the care guide team at [phone] if you need to cancel, schedule, or reschedule an appointment.   Please call the Suicide and Crisis Lifeline: 988 go to [facility] Urgent [facility] [address] [place] [phone]) call 911 if you are experiencing a Mental Health or Behavioral Health Crisis or need someone to talk to.  Tobias CHARM [name] HEDWIG, PhD [facility], [facility] Social Worker Direct Dial: [phone]  Fax: [phone]

## 2024-09-25 ENCOUNTER — Other Ambulatory Visit: Payer: Self-pay | Admitting: Family Medicine

## 2024-09-25 DIAGNOSIS — R110 Nausea: Secondary | ICD-10-CM

## 2024-09-27 ENCOUNTER — Ambulatory Visit: Payer: Self-pay

## 2024-09-27 NOTE — Telephone Encounter (Signed)
 FYI Only or Action Required?: FYI only for provider: appointment scheduled on 09/30/24.  Patient was last seen in primary care on 08/29/2024 by Teressa Harrie HERO, FNP.  Called Nurse Triage reporting Wrist Pain.  Symptoms began 3 weeks ago.  Interventions attempted: OTC medications: Tylenol , pain relief balm and Rest, hydration, or home remedies.  Symptoms are: temporary relief with pain balm.  Triage Disposition: See PCP When Office is Open (Within 3 Days)  Patient/caregiver understands and will follow disposition?: Yes  Copied from CRM [id number]. Topic: Clinical - Red Word Triage >> Sep 27, 2024 11:09 AM Larissa RAMAN wrote: [facility] that prompted transfer to Nurse Triage: rt wrist pain    ----------------------------------------------------------------------- From previous Reason for Contact - Scheduling: Patient/patient representative is calling to schedule an appointment. Refer to attachments for appointment information. Reason for Disposition  [1] MODERATE pain (e.g., interferes with normal activities) AND [2] present > 3 days  Answer Assessment - Initial Assessment Questions 08/29/24 had ov and mentioned wrist pain and swelling, she notes she received an injection that day for her headache and this relieved her wrist pain as well. She was advised to call back if wrist pain persists. Calling today for no improvement to right wrist pain, mild swelling near thumb joint. Using Tylenol  and pain balm with minimal relief.  Scheduled acute visit on 09/30/24, patient is calling for transport after disconnecting with this writer, if transport services are not open to schedule ride she will call back to reschedule this acute appointment.   1. ONSET: When did the pain start?     Few weeks 2. LOCATION: Where is the pain located?     Right wrist  3. PAIN: How bad is the pain? (Scale 1-10; or mild, moderate, severe)     moderate 4. WORK OR EXERCISE: Has there been any recent work or exercise  that involved this part (i.e., hand or wrist) of the body?     Cooking  5. CAUSE: What do you think is causing the pain?     unsure 6. AGGRAVATING FACTORS: What makes the pain worse? (e.g., using computer)     Cooks with iron  skillet feels maybe the weight caused stretching 7. OTHER SYMPTOMS: Do you have any other symptoms? (e.g., fever, neck pain, numbness or tingling, rash, swelling)     Denies  8. PREGNANCY: Is there any chance you are pregnant? When was your last menstrual period?  Protocols used: Wrist Pain-A-AH

## 2024-09-30 ENCOUNTER — Ambulatory Visit: Admitting: Family Medicine

## 2024-10-02 ENCOUNTER — Other Ambulatory Visit: Payer: Self-pay

## 2024-10-02 ENCOUNTER — Other Ambulatory Visit: Payer: Self-pay | Admitting: Licensed Clinical Social Worker

## 2024-10-02 NOTE — Patient Outreach (Signed)
 Social Drivers of Health  Community Resource and Care Coordination Visit Note   10/02/2024  Name: Anna Hayes MRN: [id number] DOB:[birth year]  Situation: Referral received for [facility] needs assessment and assistance related to Level of care. I obtained verbal consent from Patient.  Visit completed with Patient on the phone.   Background:      Assessment:   Goals Addressed             This Visit's Progress    BSW VBCI Social Work Care Plan       Current SDOH Barriers:  FNS Application (pending) and [facility] care follow up  Interventions: Patient interviewed and appropriate screenings performed Discussed plans with patient for ongoing follow up and provided patient with direct contact number Collaborated with [facility] (community agency) re: Palliative Care Referral.  SW spoke to [place] who does not see a referral for pt in their system.  A new referral is completed. Patient reports she has completed the [facility] application and is waiting for an update by the end of January.  Pt has food to eat.            Recommendation:   Patient will await follow up from [facility] and foodstamps.  Follow Up Plan:   Telephone follow up appointment date/time:  10/10/24 at 10am w/Carla Nelson.  Tillman Gardener, BSW [place]  [facility], [facility] Social Worker Direct Dial: [phone]  Fax: [phone] Website: [email or web address]

## 2024-10-02 NOTE — Patient Instructions (Signed)
 Visit Information  Thank you for taking time to visit with me today. Please don't hesitate to contact me if I can be of assistance to you before our next scheduled appointment.  Your next care management appointment is by telephone on 10/10/24 at 10am  Please call the care guide team at [phone] if you need to cancel, schedule, or reschedule an appointment.   Please call 911 if you are experiencing a Mental Health or Behavioral Health Crisis or need someone to talk to.  Tillman Gardener, BSW [place]  [facility], [facility] Social Worker Direct Dial: [phone]  Fax: [phone] Website: [email or web address]

## 2024-10-03 ENCOUNTER — Ambulatory Visit (INDEPENDENT_AMBULATORY_CARE_PROVIDER_SITE_OTHER): Admitting: Family Medicine

## 2024-10-03 ENCOUNTER — Encounter: Payer: Self-pay | Admitting: Family Medicine

## 2024-10-03 VITALS — BP 128/72 | HR 69 | Temp 98.0°F | Resp 16 | Ht 62.5 in | Wt 213.0 lb

## 2024-10-03 DIAGNOSIS — M25531 Pain in right wrist: Secondary | ICD-10-CM | POA: Diagnosis not present

## 2024-10-03 DIAGNOSIS — R7303 Prediabetes: Secondary | ICD-10-CM | POA: Diagnosis not present

## 2024-10-03 DIAGNOSIS — I10 Essential (primary) hypertension: Secondary | ICD-10-CM

## 2024-10-03 DIAGNOSIS — Z89612 Acquired absence of left leg above knee: Secondary | ICD-10-CM | POA: Insufficient documentation

## 2024-10-03 DIAGNOSIS — G43009 Migraine without aura, not intractable, without status migrainosus: Secondary | ICD-10-CM

## 2024-10-03 MED ORDER — PREDNISONE 50 MG PO TABS: 50.0000 mg | ORAL_TABLET | Freq: Every day | ORAL | 0 refills | Status: AC

## 2024-10-03 MED ORDER — BUTALBITAL-APAP-CAFFEINE 50-325-40 MG PO TABS: 1.0000 | ORAL_TABLET | Freq: Four times a day (QID) | ORAL | 2 refills | Status: AC | PRN

## 2024-10-03 NOTE — Assessment & Plan Note (Signed)
 Essential hypertension Blood pressure well-controlled. BP Readings from Last 3 Encounters:  10/03/24 128/72  08/29/24 136/70  05/28/24 124/62

## 2024-10-03 NOTE — Progress Notes (Signed)
 "  Acute Office Visit  Subjective:    Patient ID: Anna Hayes, female    DOB: [birth year], 75 y.o.   MRN: [id number]  Chief Complaint  Patient presents with   Wrist Pain    Discussed the use of AI scribe software for clinical note transcription with the patient, who gave verbal consent to proceed.  History of Present Illness   Anna Hayes is a 74 year old female who presents with wrist pain and swelling.  Wrist pain and swelling - Pain and swelling in the wrist for a couple of weeks - Pain intensifies with movement, especially when bending the hand - Noticeable bump at the site of swelling - Pain disrupts sleep and limits ability to perform daily activities, such as making a sandwich - No recent trauma to the wrist, but uses a cast iron  pan for cooking - No known history of arthritis, but previously informed of possible development of arthritis  Chronic migraines - Long-standing history of migraines since teenage years - Manages migraines with Fioricet - Steroid injections provide acute relief for migraines - Previous steroid injection appeared to affect the wrist  Hyperglycemia - No diabetes - Prediabetic - high sugars attributed to prednisone  use  Tobacco use - Smokes and is attempting to reduce consumption    Past Medical History:  Diagnosis Date   Anemia    Constipation    DVT of lower extremity (deep venous thrombosis) (HCC)    Frequent headaches    Heart murmur    found during pregnancy -  no problems   Hypertension    Muscle pain    Palpitations    Septic thrombophlebitis 08/31/2017   Sinus problem    Stroke [facility])     Past Surgical History:  Procedure Laterality Date   ABDOMINAL EXPLORATION SURGERY  05/2017   LOA and release of SBO, repair of small bowel perforation.   AMPUTATION Left 07/05/2017   Procedure: LEFT ABOVE KNEE AMPUTATION;  Surgeon: Laurence Redell CROME, MD;  Location: [facility] OR;  Service: Vascular;  Laterality: Left;   CATARACT  EXTRACTION W/PHACO Right 06/12/2018   Procedure: CATARACT EXTRACTION WITH INTRAOCULAR LENS IMPLANT;  Surgeon: Tobie Baptist, MD;  Location: [facility] OR;  Service: Ophthalmology;  Laterality: Right;   COLONOSCOPY  11/26/2015   Dr Larene - follow-up in 5 years   EMBOLECTOMY Bilateral 06/27/2017   Procedure: BILATERAL FEMORAL POPLITEAL EMBOLECTOMY;  Surgeon: Serene Gaile ORN, MD;  Location: [facility] OR;  Service: Vascular;  Laterality: Bilateral;   FASCIOTOMY Left 06/27/2017   Procedure: FULL COMPARTMENT LEFT LOWER LEG FASCIOTOMY;  Surgeon: Serene Gaile ORN, MD;  Location: [facility] OR;  Service: Vascular;  Laterality: Left;   PARTIAL HYSTERECTOMY     PATCH ANGIOPLASTY Left 06/27/2017   Procedure: PATCH ANGIOPLASTY Left Posterior Tibial Artery;  Surgeon: Serene Gaile ORN, MD;  Location: [facility] OR;  Service: Vascular;  Laterality: Left;   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 07/11/2017   Procedure: REPAIR OF COMPLEX TRACTION RETINAL DETACHMENT WITH ENDO LASER AND ANTIFUNGAL INJECTION;  Surgeon: Tobie Baptist, MD;  Location: [facility] OR;  Service: Ophthalmology;  Laterality: Right;   TEE WITHOUT CARDIOVERSION N/A 07/11/2017   Procedure: TRANSESOPHAGEAL ECHOCARDIOGRAM (TEE);  Surgeon: Alveta Aleene PARAS, MD;  Location: [facility] ENDOSCOPY;  Service: Cardiovascular;  Laterality: N/A;    Family History  Problem Relation Age of Onset   Breast cancer Mother    Prostate cancer Father    Prostate cancer Brother     Social History  Socioeconomic History   Marital status: Widowed    Spouse name: Not on file   Number of children: Not on file   Years of education: Not on file   Highest education level: Not on file  Occupational History   Not on file  Tobacco Use   Smoking status: Some Days    Current packs/day: 0.25    Average packs/day: 0.3 packs/day for 53.0 years (13.3 ttl pk-yrs)    Types: Cigarettes   Smokeless tobacco: Never  Vaping Use   Vaping status: Never Used  Substance and Sexual Activity    Alcohol use: No   Drug use: No   Sexual activity: Not Currently  Other Topics Concern   Not on file  Social History Narrative   Lives with her daughter   Social Drivers of Health   Tobacco Use: High Risk (10/03/2024)   Patient History    Smoking Tobacco Use: Some Days    Smokeless Tobacco Use: Never    Passive Exposure: Not on file  Financial Resource Strain: Low Risk (07/31/2023)   Overall Financial Resource Strain (CARDIA)    Difficulty of Paying Living Expenses: Not hard at all  Food Insecurity: No Food Insecurity (08/26/2024)   Epic    Worried About Radiation Protection Practitioner of Food in the Last Year: Never true    Ran Out of Food in the Last Year: Never true  Transportation Needs: No Transportation Needs (08/26/2024)   Epic    Lack of Transportation (Medical): No    Lack of Transportation (Non-Medical): No  Physical Activity: Insufficiently Active (07/31/2023)   Exercise Vital Sign    Days of Exercise per Week: 1 day    Minutes of Exercise per Session: 60 min  Stress: No Stress Concern Present (07/31/2023)   [employer] of Occupational Health - Occupational Stress Questionnaire    Feeling of Stress : Not at all  Social Connections: Moderately Isolated (07/31/2023)   Social Connection and Isolation Panel    Frequency of Communication with Friends and Family: More than three times a week    Frequency of Social Gatherings with Friends and Family: More than three times a week    Attends Religious Services: 1 to 4 times per year    Active Member of [employer] or Organizations: No    Attends Banker Meetings: Never    Marital Status: Widowed  Intimate Partner Violence: Not At Risk (08/26/2024)   Epic    Fear of Current or Ex-Partner: No    Emotionally Abused: No    Physically Abused: No    Sexually Abused: No  Depression (PHQ2-9): Low Risk (10/03/2024)   Depression (PHQ2-9)    PHQ-2 Score: 0  Alcohol Screen: Low Risk (10/13/2022)   Alcohol Screen    Last Alcohol Screening Score  (AUDIT): 0  Housing: Unknown (08/26/2024)   Epic    Unable to Pay for Housing in the Last Year: No    Number of Times Moved in the Last Year: Not on file    Homeless in the Last Year: No  Utilities: Not At Risk (08/26/2024)   Epic    Threatened with loss of utilities: No  Health Literacy: Adequate Health Literacy (07/31/2023)   B1300 Health Literacy    Frequency of need for help with medical instructions: Never    Outpatient Medications Prior to Visit  Medication Sig Dispense Refill   atenolol  (TENORMIN ) 50 MG tablet TAKE 1 TABLET BY MOUTH EVERY DAY 90 tablet 1   cyanocobalamin  (,VITAMIN  B-12,) 1000 MCG/ML injection Inject into the muscle.     fluticasone  (FLONASE ) 50 MCG/ACT nasal spray SPRAY 2 SPRAYS INTO EACH NOSTRIL EVERY DAY 48 mL 2   furosemide (LASIX) 20 MG tablet Take 20 mg by mouth daily as needed for fluid.      levothyroxine  (SYNTHROID ) 50 MCG tablet TAKE 1 TABLET BY MOUTH EVERY DAY 90 tablet 1   linaclotide  (LINZESS ) 145 MCG CAPS capsule Take 1 capsule (145 mcg total) by mouth daily before breakfast. 90 capsule 1   losartan  (COZAAR ) 50 MG tablet TAKE 1 TABLET BY MOUTH EVERY DAY 90 tablet 1   NIFEdipine  (PROCARDIA -XL/NIFEDICAL-XL) 30 MG 24 hr tablet TAKE 1 TABLET BY MOUTH EVERY DAY 90 tablet 1   omega-3 acid ethyl esters (LOVAZA ) 1 g capsule TAKE 2 CAPSULES BY MOUTH 2 TIMES DAILY. 360 capsule 1   omeprazole  (PRILOSEC) 40 MG capsule Take 1 capsule (40 mg total) by mouth daily. 90 capsule 1   ondansetron  (ZOFRAN ) 4 MG tablet TAKE 1 TABLET BY MOUTH EVERY 8 HOURS AS NEEDED FOR NAUSEA AND VOMITING 90 tablet 1   rivaroxaban  (XARELTO ) 20 MG TABS tablet Take 1 tablet (20 mg total) by mouth daily with supper. 90 tablet 1   simvastatin  (ZOCOR ) 40 MG tablet TAKE 1 TABLET BY MOUTH EVERY DAY 90 tablet 1   butalbital -acetaminophen -caffeine  (FIORICET) 50-325-40 MG tablet Take 1 tablet by mouth every 6 (six) hours as needed for migraine. 30 tablet 2   No facility-administered medications prior  to visit.    Allergies[1]  Review of Systems  Constitutional:  Negative for chills, diaphoresis, fatigue and fever.  HENT:  Negative for congestion, ear pain and sinus pain.   Respiratory:  Negative for cough and shortness of breath.   Cardiovascular:  Negative for chest pain.  Gastrointestinal:  Negative for abdominal pain, constipation, nausea and vomiting.  Genitourinary:  Negative for dysuria.  Musculoskeletal:  Positive for arthralgias (right wrist) and joint swelling.  Neurological:  Positive for headaches. Negative for weakness.  Psychiatric/Behavioral:  Negative for dysphoric mood. The patient is not nervous/anxious.        Objective:        10/03/2024   11:08 AM 08/29/2024    9:59 AM 05/28/2024    9:50 AM  Vitals with BMI  Height 5' 2.5 5' 2.5 5' 2.5  Weight 213 lbs 166 lbs 3 oz 184 lbs  BMI 38.31 29.9 33.1  Systolic 128 136 875  Diastolic 72 70 62  Pulse 69 50 69    No data found.   Physical Exam Vitals reviewed.  Constitutional:      General: She is not in acute distress.    Appearance: Normal appearance. She is not ill-appearing.  Cardiovascular:     Rate and Rhythm: Normal rate and regular rhythm.     Heart sounds: Normal heart sounds. No murmur heard. Pulmonary:     Effort: Pulmonary effort is normal. No respiratory distress.     Breath sounds: Normal breath sounds.  Abdominal:     General: Bowel sounds are normal.     Palpations: Abdomen is soft.     Tenderness: There is no abdominal tenderness.  Musculoskeletal:     Right wrist: Swelling and tenderness present. Decreased range of motion.     Comments: See photo  Neurological:     Mental Status: She is alert. Mental status is at baseline.  Psychiatric:        Mood and Affect: Mood normal.  Behavior: Behavior normal.     Health Maintenance Due  Topic Date Due   FOOT EXAM  Never done   OPHTHALMOLOGY EXAM  Never done   DTaP/Tdap/Td (1 - Tdap) Never done   Zoster Vaccines- Shingrix (1  of 2) Never done   Colonoscopy  12/08/2022   COVID-19 Vaccine (5 - 2025-26 season) 05/27/2024    There are no preventive care reminders to display for this patient.   Lab Results  Component Value Date   TSH 2.760 08/29/2024   Lab Results  Component Value Date   WBC 5.6 08/29/2024   HGB 11.6 08/29/2024   HCT 36.5 08/29/2024   MCV 79 08/29/2024   PLT 529 (H) 08/29/2024   Lab Results  Component Value Date   NA 137 08/29/2024   K 4.8 08/29/2024   CO2 21 08/29/2024   GLUCOSE 94 08/29/2024   BUN 13 08/29/2024   CREATININE 0.91 08/29/2024   BILITOT 0.2 08/29/2024   ALKPHOS 94 08/29/2024   AST 16 08/29/2024   ALT 13 08/29/2024   PROT 7.6 08/29/2024   ALBUMIN 4.5 08/29/2024   CALCIUM 10.2 08/29/2024   ANIONGAP 9 12/08/2022   EGFR 66 08/29/2024   Lab Results  Component Value Date   CHOL 143 08/29/2024   Lab Results  Component Value Date   HDL 54 08/29/2024   Lab Results  Component Value Date   LDLCALC 64 08/29/2024   Lab Results  Component Value Date   TRIG 144 08/29/2024   Lab Results  Component Value Date   CHOLHDL 2.6 08/29/2024   Lab Results  Component Value Date   HGBA1C 5.8 (H) 08/29/2024        Results for orders placed or performed in visit on 08/29/24  Hemoglobin A1c   Collection Time: 08/29/24 11:37 AM  Result Value Ref Range   Hgb A1c MFr Bld 5.8 (H) 4.8 - 5.6 %   Est. average glucose Bld gHb Est-mCnc 120 mg/dL  Lipid panel   Collection Time: 08/29/24 11:37 AM  Result Value Ref Range   Cholesterol, Total 143 100 - 199 mg/dL   Triglycerides 855 0 - 149 mg/dL   HDL 54 >60 mg/dL   VLDL Cholesterol Cal 25 5 - 40 mg/dL   LDL Chol Calc (NIH) 64 0 - 99 mg/dL   Chol/HDL Ratio 2.6 0.0 - 4.4 ratio  CBC with Differential/Platelet   Collection Time: 08/29/24 11:37 AM  Result Value Ref Range   WBC 5.6 3.4 - 10.8 x10E3/uL   RBC 4.60 3.77 - 5.28 x10E6/uL   Hemoglobin 11.6 11.1 - 15.9 g/dL   Hematocrit 63.4 65.9 - 46.6 %   MCV 79 79 - 97 fL    MCH 25.2 (L) 26.6 - 33.0 pg   MCHC 31.8 31.5 - 35.7 g/dL   RDW 84.5 88.2 - 84.5 %   Platelets 529 (H) 150 - 450 x10E3/uL   Neutrophils 48 Not Estab. %   Lymphs 36 Not Estab. %   Monocytes 10 Not Estab. %   Eos 5 Not Estab. %   Basos 1 Not Estab. %   Neutrophils Absolute 2.7 1.4 - 7.0 x10E3/uL   Lymphocytes Absolute 2.0 0.7 - 3.1 x10E3/uL   Monocytes Absolute 0.5 0.1 - 0.9 x10E3/uL   EOS (ABSOLUTE) 0.3 0.0 - 0.4 x10E3/uL   Basophils Absolute 0.0 0.0 - 0.2 x10E3/uL   Immature Granulocytes 0 Not Estab. %   Immature Grans (Abs) 0.0 0.0 - 0.1 x10E3/uL  CMP14+EGFR   Collection  Time: 08/29/24 11:37 AM  Result Value Ref Range   Glucose 94 70 - 99 mg/dL   BUN 13 8 - 27 mg/dL   Creatinine, Ser 9.08 0.57 - 1.00 mg/dL   eGFR 66 >40 fO/fpw/8.26   BUN/Creatinine Ratio 14 12 - 28   Sodium 137 134 - 144 mmol/L   Potassium 4.8 3.5 - 5.2 mmol/L   Chloride 100 96 - 106 mmol/L   CO2 21 20 - 29 mmol/L   Calcium 10.2 8.7 - 10.3 mg/dL   Total Protein 7.6 6.0 - 8.5 g/dL   Albumin 4.5 3.8 - 4.8 g/dL   Globulin, Total 3.1 1.5 - 4.5 g/dL   Bilirubin Total 0.2 0.0 - 1.2 mg/dL   Alkaline Phosphatase 94 49 - 135 IU/L   AST 16 0 - 40 IU/L   ALT 13 0 - 32 IU/L  T4 AND TSH   Collection Time: 08/29/24 11:37 AM  Result Value Ref Range   TSH 2.760 0.450 - 4.500 uIU/mL   T4, Total 5.9 4.5 - 12.0 ug/dL  Iron , TIBC and Ferritin Panel   Collection Time: 08/29/24 11:37 AM  Result Value Ref Range   Total Iron  Binding Capacity 371 250 - 450 ug/dL   UIBC 680 881 - 630 ug/dL   Iron  52 27 - 139 ug/dL   Iron  Saturation 14 (L) 15 - 55 %   Ferritin 50 15 - 150 ng/mL     Assessment & Plan:   Assessment & Plan Acute pain of right wrist Acute pain of right wrist Acute pain and swelling in the right wrist for weeks, interfering with daily activities. Differential includes possible arthritis or inflammatory condition. - Prescribed prednisone  to be taken in the morning with food or coffee. - Checked insurance  coverage for potential referrals to orthopedics or x-ray if needed Orders:   predniSONE  (DELTASONE ) 50 MG tablet; Take 1 tablet (50 mg total) by mouth daily with breakfast.  Migraine without aura and without status migrainosus, not intractable Migraine without aura Chronic migraines managed with Fioricet. Previous steroid injection provided relief. Discussed temporary nature of steroid injections. - Refilled Fioricet prescription at [facility] [address]. Orders:   butalbital -acetaminophen -caffeine  (FIORICET) 50-325-40 MG tablet; Take 1 tablet by mouth every 6 (six) hours as needed for migraine.  Prediabetes Prediabetes Confirmed by lab results showing blood sugar levels in prediabetic range. Discussed impact of prednisone  on blood sugar levels. - Removed diabetes diagnosis from medical record. Orders:   Ambulatory referral to Ophthalmology  Essential (primary) hypertension Essential hypertension Blood pressure well-controlled. BP Readings from Last 3 Encounters:  10/03/24 128/72  08/29/24 136/70  05/28/24 124/62         General health maintenance Due for foot and eye exams. Discussed importance of regular screenings. - Ensured foot exam appointment is scheduled for January 27th. - Referred to ophthalmology for an eye exam, in-network with insurance.       Meds ordered this encounter  Medications   butalbital -acetaminophen -caffeine  (FIORICET) 50-325-40 MG tablet    Sig: Take 1 tablet by mouth every 6 (six) hours as needed for migraine.    Dispense:  30 tablet    Refill:  2   predniSONE  (DELTASONE ) 50 MG tablet    Sig: Take 1 tablet (50 mg total) by mouth daily with breakfast.    Dispense:  5 tablet    Refill:  0    Orders Placed This Encounter  Procedures   Ambulatory referral to Ophthalmology     Follow-up: Return in about  2 months (around 12/03/2024) for chronic, fasting, lab visit, keep FU appt already scheduled.  An After Visit Summary was printed and given to the  patient.  Harrie Cedar, FNP [facility] [phone]     [1]  Allergies Allergen Reactions   Latex Other (See Comments)    Blisters   Codeine Other (See Comments)    UNSPECIFIED REACTION    Lyrica [Pregabalin] Hives   Nurtec [Rimegepant Sulfate] Other (See Comments)    Stomach upset   Penicillins Hives, Swelling and Other (See Comments)    Has patient had a PCN reaction causing immediate rash, facial/tongue/throat swelling, SOB or lightheadedness with hypotension: Yes Has patient had a PCN reaction causing severe rash involving mucus membranes or skin necrosis: No Has patient had a PCN reaction that required hospitalization: No Has patient had a PCN reaction occurring within the last 10 years: No If all of the above answers are NO, then may proceed with Cephalosporin use.    Amlodipine  Other (See Comments)    Pedal edema   Aspirin Palpitations and Other (See Comments)    Abdominal pain   Sulfa Antibiotics Rash   "

## 2024-10-03 NOTE — Assessment & Plan Note (Signed)
 Prediabetes Confirmed by lab results showing blood sugar levels in prediabetic range. Discussed impact of prednisone  on blood sugar levels. - Removed diabetes diagnosis from medical record. Orders:   Ambulatory referral to Ophthalmology

## 2024-10-03 NOTE — Assessment & Plan Note (Signed)
 Migraine without aura Chronic migraines managed with Fioricet. Previous steroid injection provided relief. Discussed temporary nature of steroid injections. - Refilled Fioricet prescription at [facility] [address]. Orders:   butalbital -acetaminophen -caffeine  (FIORICET) 50-325-40 MG tablet; Take 1 tablet by mouth every 6 (six) hours as needed for migraine.

## 2024-10-03 NOTE — Assessment & Plan Note (Signed)
 Acute pain of right wrist Acute pain and swelling in the right wrist for weeks, interfering with daily activities. Differential includes possible arthritis or inflammatory condition. - Prescribed prednisone  to be taken in the morning with food or coffee. - Checked insurance coverage for potential referrals to orthopedics or x-ray if needed Orders:   predniSONE  (DELTASONE ) 50 MG tablet; Take 1 tablet (50 mg total) by mouth daily with breakfast.

## 2024-10-07 ENCOUNTER — Telehealth: Payer: Self-pay

## 2024-10-07 NOTE — Patient Instructions (Signed)
 Anna Hayes - I am sorry I was unable to reach you today for our scheduled appointment. I work with Teressa Harrie HERO, FNP and am calling to support your healthcare needs. Please contact me at [phone] at your earliest convenience. I look forward to speaking with you soon.   Thank you,   Heddy Shutter, RN, MSN, BSN, CCM [place]  [facility], Population Health Case Manager Phone: [phone]

## 2024-10-10 ENCOUNTER — Encounter: Payer: Self-pay | Admitting: Licensed Clinical Social Worker

## 2024-10-10 ENCOUNTER — Telehealth: Payer: Self-pay | Admitting: Licensed Clinical Social Worker

## 2024-10-10 NOTE — Patient Instructions (Signed)
 Anna Hayes - I am sorry I was unable to reach you today for our scheduled appointment. I work with Teressa Harrie HERO, FNP and am calling to support your healthcare needs. Please contact me at [phone] at your earliest convenience. I look forward to speaking with you soon.   Thank you,  Tobias CHARM [name] HEDWIG, PhD [facility], [facility] Social Worker Direct Dial: [phone]  Fax: [phone]

## 2024-10-15 ENCOUNTER — Other Ambulatory Visit: Payer: Self-pay

## 2024-10-15 NOTE — Patient Outreach (Signed)
 Complex Care Management   Visit Note  10/15/2024  Name:  Anna Hayes MRN: [id number] DOB: 1951/09/25  Situation: Referral received for Complex Care Management related to Personal care assistant resources I obtained verbal consent from Patient.  Visit completed with Patient  on the phone  Background:   Past Medical History:  Diagnosis Date   Anemia    Constipation    DVT of lower extremity (deep venous thrombosis) (HCC)    Frequent headaches    Heart murmur    found during pregnancy -  no problems   Hypertension    Muscle pain    Palpitations    Septic thrombophlebitis 08/31/2017   Sinus problem    Stroke [facility])     Assessment: Patient Reported Symptoms:  Cognitive Cognitive Status: No symptoms reported      Neurological Neurological Review of Symptoms: No symptoms reported    HEENT HEENT Symptoms Reported: No symptoms reported      Cardiovascular Cardiovascular Symptoms Reported: No symptoms reported    Respiratory Respiratory Symptoms Reported: No symptoms reported    Endocrine Endocrine Symptoms Reported: No symptoms reported Is patient diabetic?: No    Gastrointestinal Gastrointestinal Symptoms Reported: No symptoms reported      Genitourinary Genitourinary Symptoms Reported: No symptoms reported    Integumentary Integumentary Symptoms Reported: No symptoms reported    Musculoskeletal Musculoskelatal Symptoms Reviewed: Joint pain Other Musculoskeletal Symptoms: Patient reports right wrist pain-followed by PCP. She reports the pain improved after receiving her shot for migraine. Left  AKA. She continue to use wheelchair for mobility. patient has a prosthesis that she uses intermittantly.        Psychosocial Psychosocial Symptoms Reported: No symptoms reported         There were no vitals filed for this visit. Pain Scale: 0-10 Pain Score: 6  Pain Location: Wrist Pain Orientation: Right Pain Descriptors / Indicators: Aching Pain Onset:  On-going Patients Stated Pain Goal: 0  Medications Reviewed Today     Reviewed by Kalief Kattner M, RN (Registered Nurse) on 10/15/24 at [id number]  Med List Status: <None>   Medication Order Taking? Sig Documenting Provider Last Dose Status Informant  atenolol  (TENORMIN ) 50 MG tablet [id number] Yes TAKE 1 TABLET BY MOUTH EVERY DAY Cox, Kirsten, MD  Active   butalbital -acetaminophen -caffeine  (FIORICET) 50-325-40 MG tablet [id number] Yes Take 1 tablet by mouth every 6 (six) hours as needed for migraine. Teressa Harrie HERO, FNP  Active   cyanocobalamin  (,VITAMIN B-12,) 1000 MCG/ML injection [id number] Yes Inject into the muscle. [provider]  Active   fluticasone  (FLONASE ) 50 MCG/ACT nasal spray [id number] Yes SPRAY 2 SPRAYS INTO EACH NOSTRIL EVERY DAY Cox, Kirsten, MD  Active   furosemide (LASIX) 20 MG tablet [id number] Yes Take 20 mg by mouth daily as needed for fluid.  [provider]  Active Self           Med Note VIVIANN TILLMAN HERO Stevan Jun 28, 2017 12:15 PM)    levothyroxine  (SYNTHROID ) 50 MCG tablet [id number] Yes TAKE 1 TABLET BY MOUTH EVERY DAY Teressa Harrie HERO, FNP  Active   linaclotide  (LINZESS ) 145 MCG CAPS capsule [id number] Yes Take 1 capsule (145 mcg total) by mouth daily before breakfast. Teressa Harrie HERO, FNP  Active   losartan  (COZAAR ) 50 MG tablet [id number] Yes TAKE 1 TABLET BY MOUTH EVERY DAY Teressa Harrie HERO, FNP  Active   NIFEdipine  (PROCARDIA -XL/NIFEDICAL-XL) 30 MG 24 hr tablet [id number] Yes TAKE 1 TABLET BY MOUTH EVERY DAY  Cox, Kirsten, MD  Active   omega-3 acid ethyl esters (LOVAZA ) 1 g capsule [id number] Yes TAKE 2 CAPSULES BY MOUTH 2 TIMES DAILY. [facility], Kirsten, MD  Active   omeprazole  (PRILOSEC) 40 MG capsule [id number] Yes Take 1 capsule (40 mg total) by mouth daily. Sherre Clapper, MD  Active   ondansetron  (ZOFRAN ) 4 MG tablet [id number] Yes TAKE 1 TABLET BY MOUTH EVERY 8 HOURS AS NEEDED FOR NAUSEA AND VOMITING Teressa Harrie HERO, FNP  Active   predniSONE  (DELTASONE ) 50 MG tablet  [id number] Yes Take 1 tablet (50 mg total) by mouth daily with breakfast. Teressa Harrie HERO, FNP  Active   rivaroxaban  (XARELTO ) 20 MG TABS tablet [id number] Yes Take 1 tablet (20 mg total) by mouth daily with supper. Teressa Harrie HERO, FNP  Active   simvastatin  (ZOCOR ) 40 MG tablet [id number] Yes TAKE 1 TABLET BY MOUTH EVERY DAY Teressa Harrie HERO, FNP  Active           Recommendation:   Continue Current Plan of Care  Follow Up Plan: Telephone follow up appointment date/time:  11/15/24 at 9:30 am with Elida Pulse, RNCM  Heddy Shutter, RN, MSN, BSN, CCM [place]  [facility], Population Health Case Manager Phone: [phone]

## 2024-10-17 ENCOUNTER — Ambulatory Visit: Admitting: Family Medicine

## 2024-10-17 ENCOUNTER — Encounter: Payer: Self-pay | Admitting: Family Medicine

## 2024-10-17 VITALS — BP 142/62 | HR 66 | Temp 98.0°F | Resp 16 | Ht 62.5 in | Wt 166.0 lb

## 2024-10-17 DIAGNOSIS — M25531 Pain in right wrist: Secondary | ICD-10-CM

## 2024-10-17 MED ORDER — KETOROLAC TROMETHAMINE 30 MG/ML IJ SOLN
30.0000 mg | Freq: Once | INTRAMUSCULAR | Status: AC
  Administered 2024-10-17: 30 mg via INTRAMUSCULAR

## 2024-10-17 NOTE — Progress Notes (Signed)
 "  Subjective:  Patient ID: Anna Hayes, female    DOB: 1951-09-01  Age: 74 y.o. MRN: [id number]  Chief Complaint  Patient presents with   Wrist Pain    Discussed the use of AI scribe software for clinical note transcription with the patient, who gave verbal consent to proceed.  History of Present Illness   Anna Hayes is a 74 year old female who presents with persistent wrist pain.  Wrist pain - Severe, throbbing pain localized to the wrist - Pain is persistent and disrupts sleep - No worsening of pain with touch - Onset associated with use of a heavy iron  skillet, which she has discontinued - Oral prednisone  previously prescribed have been ineffective - Reports transportation limitations to get to appointments and prefers not to have imaging or PT.   Response to prior treatments - Toradol  injection administered for migraine provided unexpected relief of wrist pain - No adverse effects experienced from Toradol  injection          10/03/2024   11:13 AM 06/20/2024    1:34 PM 04/27/2023   11:16 AM 10/13/2022    1:52 PM 07/27/2022   10:49 AM  Depression screen PHQ 2/9  Decreased Interest 0 0 0 0 0  Down, Depressed, Hopeless 0 0 0 0 0  PHQ - 2 Score 0 0 0 0 0  Altered sleeping 0  0  0  Tired, decreased energy 0  0  0  Change in appetite 0  0  0  Feeling bad or failure about yourself  0  0  0  Trouble concentrating 0  0  0  Moving slowly or fidgety/restless 0  0  0  Suicidal thoughts 0  0  0  PHQ-9 Score 0  0   0   Difficult doing work/chores Not difficult at all  Not difficult at all  Not difficult at all     Data saved with a previous flowsheet row definition        10/03/2024   11:13 AM  Fall Risk   Falls in the past year? 0  Number falls in past yr: 0  Injury with Fall? 0  Risk for fall due to : No Fall Risks  Follow up Falls evaluation completed    Patient Care Team: Teressa Harrie HERO, FNP as PCP - General (Family Medicine) [name] Lister D as Social  Worker Wallace, Juana M, RN as VBCI Care Management   Review of Systems  Constitutional:  Negative for appetite change, chills, diaphoresis, fatigue and fever.  HENT:  Negative for congestion, ear pain, sinus pressure, sinus pain and sore throat.   Eyes: Negative.   Respiratory:  Negative for cough, chest tightness, shortness of breath and wheezing.   Cardiovascular:  Negative for chest pain and palpitations.  Gastrointestinal:  Negative for abdominal pain, constipation, diarrhea, nausea and vomiting.  Endocrine: Negative.   Genitourinary:  Negative for dysuria and hematuria.  Musculoskeletal:  Positive for joint swelling (right wrist pain). Negative for arthralgias, back pain and myalgias.  Skin:  Negative for rash.  Allergic/Immunologic: Negative.   Neurological:  Negative for dizziness, weakness, light-headedness and headaches.  Hematological: Negative.   Psychiatric/Behavioral:  Negative for dysphoric mood. The patient is not nervous/anxious.     Medications Ordered Prior to Encounter[1] Past Medical History:  Diagnosis Date   Anemia    Constipation    DVT of lower extremity (deep venous thrombosis) (HCC)    Frequent headaches    Heart  murmur    found during pregnancy -  no problems   Hypertension    Muscle pain    Palpitations    Septic thrombophlebitis 08/31/2017   Sinus problem    Stroke [facility])    Past Surgical History:  Procedure Laterality Date   ABDOMINAL EXPLORATION SURGERY  05/2017   LOA and release of SBO, repair of small bowel perforation.   AMPUTATION Left 07/05/2017   Procedure: LEFT ABOVE KNEE AMPUTATION;  Surgeon: Laurence Redell CROME, MD;  Location: [facility] OR;  Service: Vascular;  Laterality: Left;   CATARACT EXTRACTION W/PHACO Right 06/12/2018   Procedure: CATARACT EXTRACTION WITH INTRAOCULAR LENS IMPLANT;  Surgeon: Tobie Baptist, MD;  Location: [facility] OR;  Service: Ophthalmology;  Laterality: Right;   COLONOSCOPY  11/26/2015   Dr Larene - follow-up in 5 years    EMBOLECTOMY Bilateral 06/27/2017   Procedure: BILATERAL FEMORAL POPLITEAL EMBOLECTOMY;  Surgeon: Serene Gaile ORN, MD;  Location: [facility] OR;  Service: Vascular;  Laterality: Bilateral;   FASCIOTOMY Left 06/27/2017   Procedure: FULL COMPARTMENT LEFT LOWER LEG FASCIOTOMY;  Surgeon: Serene Gaile ORN, MD;  Location: [facility] OR;  Service: Vascular;  Laterality: Left;   PARTIAL HYSTERECTOMY     PATCH ANGIOPLASTY Left 06/27/2017   Procedure: PATCH ANGIOPLASTY Left Posterior Tibial Artery;  Surgeon: Serene Gaile ORN, MD;  Location: [facility] OR;  Service: Vascular;  Laterality: Left;   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 07/11/2017   Procedure: REPAIR OF COMPLEX TRACTION RETINAL DETACHMENT WITH ENDO LASER AND ANTIFUNGAL INJECTION;  Surgeon: Tobie Baptist, MD;  Location: [facility] OR;  Service: Ophthalmology;  Laterality: Right;   TEE WITHOUT CARDIOVERSION N/A 07/11/2017   Procedure: TRANSESOPHAGEAL ECHOCARDIOGRAM (TEE);  Surgeon: Alveta Aleene PARAS, MD;  Location: [facility] ENDOSCOPY;  Service: Cardiovascular;  Laterality: N/A;    Family History  Problem Relation Age of Onset   Breast cancer Mother    Prostate cancer Father    Prostate cancer Brother    Social History   Socioeconomic History   Marital status: Widowed    Spouse name: Not on file   Number of children: Not on file   Years of education: Not on file   Highest education level: Not on file  Occupational History   Not on file  Tobacco Use   Smoking status: Some Days    Current packs/day: 0.25    Average packs/day: 0.3 packs/day for 53.0 years (13.3 ttl pk-yrs)    Types: Cigarettes   Smokeless tobacco: Never  Vaping Use   Vaping status: Never Used  Substance and Sexual Activity   Alcohol use: No   Drug use: No   Sexual activity: Not Currently  Other Topics Concern   Not on file  Social History Narrative   Lives with her daughter   Social Drivers of Health   Tobacco Use: High Risk (10/17/2024)   Patient History    Smoking Tobacco  Use: Some Days    Smokeless Tobacco Use: Never    Passive Exposure: Not on file  Financial Resource Strain: Low Risk (07/31/2023)   Overall Financial Resource Strain (CARDIA)    Difficulty of Paying Living Expenses: Not hard at all  Food Insecurity: No Food Insecurity (08/26/2024)   Epic    Worried About Radiation Protection Practitioner of Food in the Last Year: Never true    Ran Out of Food in the Last Year: Never true  Transportation Needs: No Transportation Needs (08/26/2024)   Epic    Lack of Transportation (Medical): No  Lack of Transportation (Non-Medical): No  Physical Activity: Insufficiently Active (07/31/2023)   Exercise Vital Sign    Days of Exercise per Week: 1 day    Minutes of Exercise per Session: 60 min  Stress: No Stress Concern Present (07/31/2023)   [employer] of Occupational Health - Occupational Stress Questionnaire    Feeling of Stress : Not at all  Social Connections: Moderately Isolated (07/31/2023)   Social Connection and Isolation Panel    Frequency of Communication with Friends and Family: More than three times a week    Frequency of Social Gatherings with Friends and Family: More than three times a week    Attends Religious Services: 1 to 4 times per year    Active Member of [employer] or Organizations: No    Attends Banker Meetings: Never    Marital Status: Widowed  Depression (PHQ2-9): Low Risk (10/03/2024)   Depression (PHQ2-9)    PHQ-2 Score: 0  Alcohol Screen: Low Risk (10/13/2022)   Alcohol Screen    Last Alcohol Screening Score (AUDIT): 0  Housing: Unknown (08/26/2024)   Epic    Unable to Pay for Housing in the Last Year: No    Number of Times Moved in the Last Year: Not on file    Homeless in the Last Year: No  Utilities: Not At Risk (08/26/2024)   Epic    Threatened with loss of utilities: No  Health Literacy: Adequate Health Literacy (07/31/2023)   B1300 Health Literacy    Frequency of need for help with medical instructions: Never    Objective:   BP (!) 142/62   Pulse 66   Temp 98 F (36.7 C) (Temporal)   Resp 16   Ht 5' 2.5 (1.588 m)   Wt 166 lb (75.3 kg)   SpO2 98%   BMI 29.88 kg/m      10/17/2024    9:45 AM 10/03/2024   11:08 AM 08/29/2024    9:59 AM  BP/Weight  Systolic BP 142 128 136  Diastolic BP 62 72 70  Wt. (Lbs) 166 213 166.2  BMI 29.88 kg/m2 38.34 kg/m2 29.91 kg/m2    Physical Exam Vitals reviewed.  Constitutional:      General: She is not in acute distress.    Appearance: Normal appearance.  HENT:     Nose: No congestion.  Eyes:     Conjunctiva/sclera: Conjunctivae normal.  Cardiovascular:     Rate and Rhythm: Normal rate and regular rhythm.     Heart sounds: Normal heart sounds.  Pulmonary:     Effort: Pulmonary effort is normal.     Breath sounds: Normal breath sounds.  Musculoskeletal:     Right wrist: Swelling and tenderness present. Decreased range of motion.  Neurological:     Mental Status: She is alert. Mental status is at baseline.  Psychiatric:        Mood and Affect: Mood normal.        Behavior: Behavior normal.      Lab Results  Component Value Date   WBC 5.6 08/29/2024   HGB 11.6 08/29/2024   HCT 36.5 08/29/2024   PLT 529 (H) 08/29/2024   GLUCOSE 94 08/29/2024   CHOL 143 08/29/2024   TRIG 144 08/29/2024   HDL 54 08/29/2024   LDLCALC 64 08/29/2024   ALT 13 08/29/2024   AST 16 08/29/2024   NA 137 08/29/2024   K 4.8 08/29/2024   CL 100 08/29/2024   CREATININE 0.91 08/29/2024   BUN 13  08/29/2024   CO2 21 08/29/2024   TSH 2.760 08/29/2024   INR 1.3 (H) 12/08/2022   HGBA1C 5.8 (H) 08/29/2024    Results for orders placed or performed in visit on 08/29/24  Hemoglobin A1c   Collection Time: 08/29/24 11:37 AM  Result Value Ref Range   Hgb A1c MFr Bld 5.8 (H) 4.8 - 5.6 %   Est. average glucose Bld gHb Est-mCnc 120 mg/dL  Lipid panel   Collection Time: 08/29/24 11:37 AM  Result Value Ref Range   Cholesterol, Total 143 100 - 199 mg/dL   Triglycerides 855 0 - 149  mg/dL   HDL 54 >60 mg/dL   VLDL Cholesterol Cal 25 5 - 40 mg/dL   LDL Chol Calc (NIH) 64 0 - 99 mg/dL   Chol/HDL Ratio 2.6 0.0 - 4.4 ratio  CBC with Differential/Platelet   Collection Time: 08/29/24 11:37 AM  Result Value Ref Range   WBC 5.6 3.4 - 10.8 x10E3/uL   RBC 4.60 3.77 - 5.28 x10E6/uL   Hemoglobin 11.6 11.1 - 15.9 g/dL   Hematocrit 63.4 65.9 - 46.6 %   MCV 79 79 - 97 fL   MCH 25.2 (L) 26.6 - 33.0 pg   MCHC 31.8 31.5 - 35.7 g/dL   RDW 84.5 88.2 - 84.5 %   Platelets 529 (H) 150 - 450 x10E3/uL   Neutrophils 48 Not Estab. %   Lymphs 36 Not Estab. %   Monocytes 10 Not Estab. %   Eos 5 Not Estab. %   Basos 1 Not Estab. %   Neutrophils Absolute 2.7 1.4 - 7.0 x10E3/uL   Lymphocytes Absolute 2.0 0.7 - 3.1 x10E3/uL   Monocytes Absolute 0.5 0.1 - 0.9 x10E3/uL   EOS (ABSOLUTE) 0.3 0.0 - 0.4 x10E3/uL   Basophils Absolute 0.0 0.0 - 0.2 x10E3/uL   Immature Granulocytes 0 Not Estab. %   Immature Grans (Abs) 0.0 0.0 - 0.1 x10E3/uL  CMP14+EGFR   Collection Time: 08/29/24 11:37 AM  Result Value Ref Range   Glucose 94 70 - 99 mg/dL   BUN 13 8 - 27 mg/dL   Creatinine, Ser 9.08 0.57 - 1.00 mg/dL   eGFR 66 >40 fO/fpw/8.26   BUN/Creatinine Ratio 14 12 - 28   Sodium 137 134 - 144 mmol/L   Potassium 4.8 3.5 - 5.2 mmol/L   Chloride 100 96 - 106 mmol/L   CO2 21 20 - 29 mmol/L   Calcium 10.2 8.7 - 10.3 mg/dL   Total Protein 7.6 6.0 - 8.5 g/dL   Albumin 4.5 3.8 - 4.8 g/dL   Globulin, Total 3.1 1.5 - 4.5 g/dL   Bilirubin Total 0.2 0.0 - 1.2 mg/dL   Alkaline Phosphatase 94 49 - 135 IU/L   AST 16 0 - 40 IU/L   ALT 13 0 - 32 IU/L  T4 AND TSH   Collection Time: 08/29/24 11:37 AM  Result Value Ref Range   TSH 2.760 0.450 - 4.500 uIU/mL   T4, Total 5.9 4.5 - 12.0 ug/dL  Iron , TIBC and Ferritin Panel   Collection Time: 08/29/24 11:37 AM  Result Value Ref Range   Total Iron  Binding Capacity 371 250 - 450 ug/dL   UIBC 680 881 - 630 ug/dL   Iron  52 27 - 139 ug/dL   Iron  Saturation 14 (L)  15 - 55 %   Ferritin 50 15 - 150 ng/mL  .  Assessment & Plan:   Assessment & Plan Acute pain of right wrist Acute right wrist pain  Chronic pain exacerbated by heavy use. Previous oral medications ineffective. Toradol  injection previously provided relief. Discussed Toradol  benefits and limitations. - Administered Toradol  injection for pain relief. - Discussed next steps for referral to ortho and xray of the right wrist.  Orders:   ketorolac  (TORADOL ) 30 MG/ML injection 30 mg   Follow-up: Return if symptoms worsen or fail to improve.  An After Visit Summary was printed and given to the patient.  Harrie Cedar, FNP [facility] [phone]      [1]  Current Outpatient Medications on File Prior to Visit  Medication Sig Dispense Refill   atenolol  (TENORMIN ) 50 MG tablet TAKE 1 TABLET BY MOUTH EVERY DAY 90 tablet 1   butalbital -acetaminophen -caffeine  (FIORICET) 50-325-40 MG tablet Take 1 tablet by mouth every 6 (six) hours as needed for migraine. 30 tablet 2   cyanocobalamin  (,VITAMIN B-12,) 1000 MCG/ML injection Inject into the muscle.     fluticasone  (FLONASE ) 50 MCG/ACT nasal spray SPRAY 2 SPRAYS INTO EACH NOSTRIL EVERY DAY 48 mL 2   furosemide (LASIX) 20 MG tablet Take 20 mg by mouth daily as needed for fluid.      levothyroxine  (SYNTHROID ) 50 MCG tablet TAKE 1 TABLET BY MOUTH EVERY DAY 90 tablet 1   linaclotide  (LINZESS ) 145 MCG CAPS capsule Take 1 capsule (145 mcg total) by mouth daily before breakfast. 90 capsule 1   losartan  (COZAAR ) 50 MG tablet TAKE 1 TABLET BY MOUTH EVERY DAY 90 tablet 1   NIFEdipine  (PROCARDIA -XL/NIFEDICAL-XL) 30 MG 24 hr tablet TAKE 1 TABLET BY MOUTH EVERY DAY 90 tablet 1   omega-3 acid ethyl esters (LOVAZA ) 1 g capsule TAKE 2 CAPSULES BY MOUTH 2 TIMES DAILY. 360 capsule 1   omeprazole  (PRILOSEC) 40 MG capsule Take 1 capsule (40 mg total) by mouth daily. 90 capsule 1   ondansetron  (ZOFRAN ) 4 MG tablet TAKE 1 TABLET BY MOUTH EVERY 8 HOURS AS NEEDED  FOR NAUSEA AND VOMITING 90 tablet 1   predniSONE  (DELTASONE ) 50 MG tablet Take 1 tablet (50 mg total) by mouth daily with breakfast. 5 tablet 0   rivaroxaban  (XARELTO ) 20 MG TABS tablet Take 1 tablet (20 mg total) by mouth daily with supper. 90 tablet 1   simvastatin  (ZOCOR ) 40 MG tablet TAKE 1 TABLET BY MOUTH EVERY DAY 90 tablet 1   No current facility-administered medications on file prior to visit.   "

## 2024-10-17 NOTE — Assessment & Plan Note (Signed)
 Acute right wrist pain Chronic pain exacerbated by heavy use. Previous oral medications ineffective. Toradol  injection previously provided relief. Discussed Toradol  benefits and limitations. - Administered Toradol  injection for pain relief. - Discussed next steps for referral to ortho and xray of the right wrist.  Orders:   ketorolac  (TORADOL ) 30 MG/ML injection 30 mg

## 2024-10-17 NOTE — Patient Instructions (Signed)
" °  VISIT SUMMARY: Today, you were seen for persistent and severe wrist pain that has been disrupting your sleep. The pain started after using a heavy iron  skillet, which you have since stopped using. Previous oral medications did not help, but you experienced relief from a Toradol  injection given for a migraine.  YOUR PLAN: -ACUTE RIGHT WRIST PAIN: Acute right wrist pain means you have a sudden onset of severe pain in your right wrist. This pain was made worse by using a heavy skillet. We discussed that previous oral medications were not effective for you, but a Toradol  injection had provided relief. Today, we administered another Toradol  injection to help manage your pain.  INSTRUCTIONS: Please monitor your wrist pain and note any changes. If the pain persists or worsens, or if you experience any side effects, please contact our office. Avoid using heavy objects that may strain your wrist. Follow up with us  if you need further assistance or if the pain does not improve.    Contains text generated by [employer]bridge.   "

## 2024-10-22 ENCOUNTER — Ambulatory Visit: Admitting: Podiatry

## 2024-10-24 ENCOUNTER — Other Ambulatory Visit: Payer: Self-pay | Admitting: Licensed Clinical Social Worker

## 2024-10-24 NOTE — Patient Instructions (Signed)
 Visit Information  Thank you for taking time to visit with me today. Please don't hesitate to contact me if I can be of assistance to you before our next scheduled appointment.  Your next care management appointment is by telephone on 11/08/2024 at 11:30am   Please call the care guide team at [phone] if you need to cancel, schedule, or reschedule an appointment.   Please call the Suicide and Crisis Lifeline: 988 go to [facility] [address] [place] [phone]) call 911 if you are experiencing a Mental Health or Behavioral Health Crisis or need someone to talk to.  Anna CHARM [name] HEDWIG, PhD [facility], [facility] Social Worker Direct Dial: [phone]  Fax: [phone]

## 2024-10-24 NOTE — Patient Outreach (Signed)
 Social Drivers of Health  Community Resource and Care Coordination Visit Note   10/24/2024  Name: Anna Hayes MRN: [id number] DOB:Jan 20, 1951  Situation: Referral received for [facility] needs assessment and assistance related to [facility] referral (pending). I obtained verbal consent from Patient.  Visit completed with Patient on the phone.   Background:   SDOH Interventions Today    Flowsheet Row Most Recent Value  SDOH Interventions   Food Insecurity Interventions Intervention Not Indicated  Housing Interventions Intervention Not Indicated  Transportation Interventions Intervention Not Indicated  Utilities Interventions Intervention Not Indicated     Assessment:   Goals Addressed             This Visit's Progress    BSW VBCI Social Work Care Plan   On track    Current SDOH Barriers:  [facility] Application (pending) and [facility] care follow up  Interventions: Patient interviewed and appropriate screenings performed Discussed plans with patient for ongoing follow up and provided patient with direct contact number Collaborated with [facility] (community agency) re: Palliative Care Referral.  SW spoke to [place] [facility] who does not see a referral for pt in their system.  A new referral is completed. Patient reports she has completed the [facility] application and is waiting for an update by the end of January.  Pt has food to eat.            Recommendation:   attend all scheduled provider appointments Continue to wait ont he outcome of the [facility] application  Follow Up Plan:   Telephone follow up appointment date/time:  11/08/2024 at 11:30 am  Tobias CHARM [name] HEDWIG, PhD [facility], [facility] Social Worker Direct Dial: [phone]  Fax: [phone]

## 2024-11-05 ENCOUNTER — Ambulatory Visit: Admitting: Podiatry

## 2024-11-08 ENCOUNTER — Other Ambulatory Visit: Admitting: Licensed Clinical Social Worker

## 2024-11-15 ENCOUNTER — Telehealth

## 2024-12-03 ENCOUNTER — Ambulatory Visit: Admitting: Family Medicine
